# Patient Record
Sex: Female | Born: 1940 | Race: White | Hispanic: No | Marital: Single | Smoking: Never smoker
Health system: Southern US, Community
[De-identification: ages and names within clinical notes are randomized; demographics above are authoritative.]

## PROBLEM LIST (undated history)

## (undated) DIAGNOSIS — I10 Essential (primary) hypertension: Secondary | ICD-10-CM

## (undated) DIAGNOSIS — H3530 Unspecified macular degeneration: Secondary | ICD-10-CM

## (undated) DIAGNOSIS — D638 Anemia in other chronic diseases classified elsewhere: Secondary | ICD-10-CM

## (undated) DIAGNOSIS — I499 Cardiac arrhythmia, unspecified: Secondary | ICD-10-CM

## (undated) DIAGNOSIS — K219 Gastro-esophageal reflux disease without esophagitis: Secondary | ICD-10-CM

## (undated) DIAGNOSIS — T8859XA Other complications of anesthesia, initial encounter: Secondary | ICD-10-CM

## (undated) DIAGNOSIS — J449 Chronic obstructive pulmonary disease, unspecified: Secondary | ICD-10-CM

## (undated) DIAGNOSIS — F419 Anxiety disorder, unspecified: Secondary | ICD-10-CM

## (undated) DIAGNOSIS — M797 Fibromyalgia: Secondary | ICD-10-CM

## (undated) DIAGNOSIS — T4145XA Adverse effect of unspecified anesthetic, initial encounter: Secondary | ICD-10-CM

## (undated) DIAGNOSIS — R351 Nocturia: Secondary | ICD-10-CM

## (undated) DIAGNOSIS — Z9289 Personal history of other medical treatment: Secondary | ICD-10-CM

## (undated) DIAGNOSIS — I639 Cerebral infarction, unspecified: Secondary | ICD-10-CM

## (undated) DIAGNOSIS — M1990 Unspecified osteoarthritis, unspecified site: Secondary | ICD-10-CM

## (undated) DIAGNOSIS — E119 Type 2 diabetes mellitus without complications: Secondary | ICD-10-CM

## (undated) DIAGNOSIS — N179 Acute kidney failure, unspecified: Secondary | ICD-10-CM

## (undated) DIAGNOSIS — J45909 Unspecified asthma, uncomplicated: Secondary | ICD-10-CM

## (undated) DIAGNOSIS — K5790 Diverticulosis of intestine, part unspecified, without perforation or abscess without bleeding: Secondary | ICD-10-CM

## (undated) DIAGNOSIS — G2581 Restless legs syndrome: Secondary | ICD-10-CM

## (undated) DIAGNOSIS — M4030 Flatback syndrome, site unspecified: Secondary | ICD-10-CM

## (undated) DIAGNOSIS — G4730 Sleep apnea, unspecified: Secondary | ICD-10-CM

## (undated) DIAGNOSIS — L299 Pruritus, unspecified: Secondary | ICD-10-CM

## (undated) HISTORY — DX: Restless legs syndrome: G25.81

## (undated) HISTORY — DX: Chronic obstructive pulmonary disease, unspecified: J44.9

## (undated) HISTORY — PX: TONSILLECTOMY: SUR1361

## (undated) HISTORY — PX: DILATION AND CURETTAGE OF UTERUS: SHX78

## (undated) HISTORY — PX: HERNIA REPAIR: SHX51

## (undated) HISTORY — PX: OTHER SURGICAL HISTORY: SHX169

## (undated) HISTORY — DX: Anemia in other chronic diseases classified elsewhere: D63.8

## (undated) HISTORY — PX: BREAST REDUCTION SURGERY: SHX8

## (undated) HISTORY — PX: BACK SURGERY: SHX140

## (undated) HISTORY — PX: POSTERIOR LAMINECTOMY / DECOMPRESSION LUMBAR SPINE: SUR740

## (undated) HISTORY — PX: ABDOMINAL HYSTERECTOMY: SHX81

---

## 1988-09-17 HISTORY — PX: CHOLECYSTECTOMY: SHX55

## 2008-09-11 ENCOUNTER — Ambulatory Visit: Payer: Self-pay | Admitting: Family Medicine

## 2008-09-17 DIAGNOSIS — I639 Cerebral infarction, unspecified: Secondary | ICD-10-CM

## 2008-09-17 HISTORY — DX: Cerebral infarction, unspecified: I63.9

## 2008-09-28 ENCOUNTER — Ambulatory Visit: Payer: Self-pay | Admitting: Family Medicine

## 2008-11-09 ENCOUNTER — Ambulatory Visit: Payer: Self-pay | Admitting: Internal Medicine

## 2009-01-10 ENCOUNTER — Ambulatory Visit: Payer: Self-pay | Admitting: Internal Medicine

## 2009-03-28 ENCOUNTER — Ambulatory Visit: Payer: Self-pay | Admitting: Internal Medicine

## 2009-04-15 ENCOUNTER — Inpatient Hospital Stay: Payer: Self-pay | Admitting: Internal Medicine

## 2009-09-17 DIAGNOSIS — Z9289 Personal history of other medical treatment: Secondary | ICD-10-CM

## 2009-09-17 HISTORY — DX: Personal history of other medical treatment: Z92.89

## 2009-12-02 ENCOUNTER — Ambulatory Visit: Payer: Self-pay | Admitting: Internal Medicine

## 2010-05-04 ENCOUNTER — Emergency Department: Payer: Self-pay | Admitting: Emergency Medicine

## 2010-11-28 ENCOUNTER — Emergency Department: Payer: Self-pay | Admitting: Emergency Medicine

## 2010-11-30 ENCOUNTER — Encounter: Payer: Self-pay | Admitting: Internal Medicine

## 2012-04-13 ENCOUNTER — Emergency Department: Payer: Self-pay | Admitting: *Deleted

## 2012-04-13 LAB — CK TOTAL AND CKMB (NOT AT ARMC): CK-MB: 1.7 ng/mL (ref 0.5–3.6)

## 2012-04-13 LAB — COMPREHENSIVE METABOLIC PANEL
Alkaline Phosphatase: 108 U/L (ref 50–136)
BUN: 20 mg/dL — ABNORMAL HIGH (ref 7–18)
Chloride: 103 mmol/L (ref 98–107)
Co2: 26 mmol/L (ref 21–32)
EGFR (African American): 60 — ABNORMAL LOW
EGFR (Non-African Amer.): 51 — ABNORMAL LOW
Potassium: 3.8 mmol/L (ref 3.5–5.1)
SGOT(AST): 30 U/L (ref 15–37)
SGPT (ALT): 24 U/L
Total Protein: 7.1 g/dL (ref 6.4–8.2)

## 2012-04-13 LAB — CBC
HGB: 13.3 g/dL (ref 12.0–16.0)
MCH: 31.3 pg (ref 26.0–34.0)
MCV: 90 fL (ref 80–100)
RBC: 4.26 10*6/uL (ref 3.80–5.20)
WBC: 7.1 10*3/uL (ref 3.6–11.0)

## 2012-04-13 LAB — TROPONIN I: Troponin-I: <0.02 ng/mL

## 2013-03-26 ENCOUNTER — Encounter (HOSPITAL_COMMUNITY): Payer: Self-pay | Admitting: Pharmacy Technician

## 2013-03-26 NOTE — Progress Notes (Signed)
Need orders please - PT coming for PREOP TUES 03/31/13 - Thank you

## 2013-03-29 ENCOUNTER — Other Ambulatory Visit: Payer: Self-pay | Admitting: Orthopedic Surgery

## 2013-03-29 MED ORDER — DEXAMETHASONE SODIUM PHOSPHATE 10 MG/ML IJ SOLN: 10.0000 mg | Freq: Once | INTRAMUSCULAR | Status: DC

## 2013-03-29 MED ORDER — BUPIVACAINE LIPOSOME 1.3 % IJ SUSP: 20.0000 mL | Freq: Once | INTRAMUSCULAR | Status: DC

## 2013-03-29 NOTE — Progress Notes (Signed)
Preoperative surgical orders have been place into the [facility] hospital system for Cheyenne Frank on 03/29/2013, 10:53 PM  by Patrica Duel for surgery on 04/06/2013.  Preop Total Knee orders including Experal, PO Tylenol, and IV Decadron as long as there are no contraindications to the above medications. Avel Peace, PA-C

## 2013-03-31 ENCOUNTER — Encounter (HOSPITAL_COMMUNITY): Payer: Self-pay

## 2013-03-31 ENCOUNTER — Encounter (HOSPITAL_COMMUNITY)
Admission: RE | Admit: 2013-03-31 | Discharge: 2013-03-31 | Disposition: A | Discharge status: Home / Self Care | Payer: Medicare Other | Source: Ambulatory Visit | Attending: Orthopedic Surgery | Admitting: Orthopedic Surgery

## 2013-03-31 DIAGNOSIS — Z01812 Encounter for preprocedural laboratory examination: Secondary | ICD-10-CM | POA: Insufficient documentation

## 2013-03-31 DIAGNOSIS — M1710 Unilateral primary osteoarthritis, unspecified knee: Secondary | ICD-10-CM | POA: Insufficient documentation

## 2013-03-31 HISTORY — DX: Flatback syndrome, site unspecified: M40.30

## 2013-03-31 HISTORY — DX: Unspecified asthma, uncomplicated: J45.909

## 2013-03-31 HISTORY — DX: Unspecified osteoarthritis, unspecified site: M19.90

## 2013-03-31 HISTORY — DX: Personal history of other medical treatment: Z92.89

## 2013-03-31 HISTORY — DX: Nocturia: R35.1

## 2013-03-31 HISTORY — DX: Diverticulosis of intestine, part unspecified, without perforation or abscess without bleeding: K57.90

## 2013-03-31 HISTORY — DX: Anxiety disorder, unspecified: F41.9

## 2013-03-31 HISTORY — DX: Essential (primary) hypertension: I10

## 2013-03-31 HISTORY — DX: Fibromyalgia: M79.7

## 2013-03-31 HISTORY — DX: Sleep apnea, unspecified: G47.30

## 2013-03-31 HISTORY — DX: Gastro-esophageal reflux disease without esophagitis: K21.9

## 2013-03-31 HISTORY — DX: Other complications of anesthesia, initial encounter: T88.59XA

## 2013-03-31 HISTORY — DX: Unspecified macular degeneration: H35.30

## 2013-03-31 HISTORY — DX: Cerebral infarction, unspecified: I63.9

## 2013-03-31 HISTORY — DX: Pruritus, unspecified: L29.9

## 2013-03-31 HISTORY — DX: Type 2 diabetes mellitus without complications: E11.9

## 2013-03-31 HISTORY — DX: Cardiac arrhythmia, unspecified: I49.9

## 2013-03-31 HISTORY — DX: Adverse effect of unspecified anesthetic, initial encounter: T41.45XA

## 2013-03-31 LAB — URINALYSIS, ROUTINE W REFLEX MICROSCOPIC
Bilirubin Urine: NEGATIVE
Nitrite: NEGATIVE
Specific Gravity, Urine: 1.028 (ref 1.005–1.030)
Urobilinogen, UA: 0.2 mg/dL (ref 0.0–1.0)
pH: 5 (ref 5.0–8.0)

## 2013-03-31 LAB — URINE MICROSCOPIC-ADD ON

## 2013-03-31 LAB — COMPREHENSIVE METABOLIC PANEL
ALT: 22 U/L (ref 0–35)
AST: 25 U/L (ref 0–37)
Albumin: 3.4 g/dL — ABNORMAL LOW (ref 3.5–5.2)
Alkaline Phosphatase: 101 U/L (ref 39–117)
BUN: 21 mg/dL (ref 6–23)
Chloride: 101 mEq/L (ref 96–112)
Potassium: 4.3 mEq/L (ref 3.5–5.1)
Sodium: 141 mEq/L (ref 135–145)
Total Bilirubin: 0.2 mg/dL — ABNORMAL LOW (ref 0.3–1.2)
Total Protein: 6.8 g/dL (ref 6.0–8.3)

## 2013-03-31 LAB — APTT: aPTT: 27 seconds (ref 24–37)

## 2013-03-31 LAB — CBC
HCT: 41 % (ref 36.0–46.0)
MCHC: 32.2 g/dL (ref 30.0–36.0)
Platelets: 221 10*3/uL (ref 150–400)
RDW: 13.8 % (ref 11.5–15.5)
WBC: 8.2 10*3/uL (ref 4.0–10.5)

## 2013-03-31 LAB — PROTIME-INR: Prothrombin Time: 12.2 seconds (ref 11.6–15.2)

## 2013-03-31 LAB — SURGICAL PCR SCREEN: Staphylococcus aureus: NEGATIVE

## 2013-03-31 NOTE — Patient Instructions (Signed)
Cheyenne Frank  03/31/2013                           YOUR PROCEDURE IS SCHEDULED ON: 04/06/13               PLEASE REPORT TO SHORT STAY CENTER AT : 8:30 AM               CALL THIS NUMBER IF ANY PROBLEMS THE DAY OF SURGERY :               [phone]                      REMEMBER:   Do not eat food or drink liquids AFTER MIDNIGHT      Take these medicines the morning of surgery with A SIP OF WATER:  CARVEDILOL / GABAPENTIN / PAXIL / TAKE ONLY 1/2 DOSE OF INSULIN THE NIGHT BEFORE SURGERY   Do not wear jewelry, make-up   Do not wear lotions, powders, or perfumes.   Do not shave legs or underarms 12 hrs. before surgery (men may shave face)  Do not bring valuables to the hospital.  Contacts, dentures or bridgework may not be worn into surgery.  Leave suitcase in the car. After surgery it may be brought to your room.  For patients admitted to the hospital more than one night, checkout time is 11:00                          The day of discharge.   Patients discharged the day of surgery will not be allowed to drive home                             If going home same day of surgery, must have someone stay with you first                           24 hrs at home and arrange for some one to drive you home from hospital.    Special Instructions:   Please read over the following fact sheets that you were given:               1. MRSA  INFORMATION                      2. [name] PREPARING FOR SURGERY SHEET               3. INCENTIVE SPIROMETER                                                X_____________________________________________________________________        Failure to follow these instructions may result in cancellation of your surgery

## 2013-04-01 NOTE — Progress Notes (Signed)
Abnormal UA / CMET faxed to Dr.Aluisio 

## 2013-04-05 NOTE — H&P (Signed)
TOTAL KNEE ADMISSION H&P  Patient is being admitted for left total knee arthroplasty.  Subjective:  Chief Complaint:left knee pain.  HPI: Cheyenne Frank, 72 y.o. female, has a history of pain and functional disability in the left knee due to arthritis and has failed non-surgical conservative treatments for greater than 12 weeks to includeNSAID's and/or analgesics, corticosteriod injections, viscosupplementation injections, use of assistive devices and activity modification.  Onset of symptoms was gradual, starting 20 years ago with gradually worsening course since that time. The patient noted no past surgery on the left knee(s).  Patient currently rates pain in the left knee(s) at 8 out of 10 with activity. Patient has night pain, worsening of pain with activity and weight bearing, pain that interferes with activities of daily living, pain with passive range of motion, crepitus and joint swelling.  Patient has evidence of periarticular osteophytes and joint space narrowing by imaging studies. There is no active infection.   Past Medical History  Diagnosis Date  . Macular degeneration   . Complication of anesthesia     " feel like I'm dying after I wake up"  . Hypertension   . Dysrhythmia     unknown type   . Asthma     no problems since 1999 (after moving into a new home)  . Stroke 2010    verbal aphasia x 30 min - resolved - no problem since then  . Arthritis   . Fibromyalgia   . Itching   . GERD (gastroesophageal reflux disease)   . Nocturia   . Hx of transfusion 2011  . Diabetes mellitus without complication   . Sleep apnea     Dx 1990's unable to wear c-pap -  . Anxiety   . Diverticulosis   . Flat back syndrome     Past Surgical History  Procedure Laterality Date  . Tonsillectomy    . Dilation and curettage of uterus    . Abdominal hysterectomy      rt so  . Hernia repair    . Breast reduction surgery    . Cholecystectomy  1990  . Cataracts removed    . Back surgery   2010 / 2011     Current outpatient prescriptions: acetaminophen (TYLENOL) 500 MG tablet, Take 1,000 mg by mouth every 6 (six) hours as needed for pain., Disp: , Rfl: ;  aspirin EC 81 MG tablet, Take 81 mg by mouth every morning., Disp: , Rfl: ;  beta carotene w/minerals (OCUVITE) tablet, Take 1 tablet by mouth 2 (two) times daily., Disp: , Rfl: ;  carvedilol (COREG) 3.125 MG tablet, Take 3.125 mg by mouth 2 (two) times daily with a meal., Disp: , Rfl:  gabapentin (NEURONTIN) 800 MG tablet, Take 800 mg by mouth 3 (three) times daily., Disp: , Rfl: ;  insulin glargine (LANTUS) 100 UNIT/ML injection, Inject 48 Units into the skin at bedtime., Disp: , Rfl: ;  lisinopril (PRINIVIL,ZESTRIL) 20 MG tablet, Take 20 mg by mouth 2 (two) times daily., Disp: , Rfl: ;  naproxen sodium (ANAPROX) 220 MG tablet, Take 440 mg by mouth 2 (two) times daily as needed (for pain)., Disp: , Rfl:  PARoxetine (PAXIL) 20 MG tablet, Take 20 mg by mouth every morning., Disp: , Rfl: ;  traZODone (DESYREL) 50 MG tablet, Take 50 mg by mouth at bedtime., Disp: , Rfl:   Allergies  Allergen Reactions  . Demerol (Meperidine)     Agitation  . Dilaudid (Hydromorphone)     Paranoia  .  Sulfa Antibiotics Rash    History  Substance Use Topics  . Smoking status: Never Smoker   . Smokeless tobacco: Not on file  . Alcohol Use: No   Family History Father deceased in his 55s due to MI Mother deceased at 68 due to respiratory complications  Review of Systems  Constitutional: Positive for malaise/fatigue. Negative for fever, chills, weight loss and diaphoresis.  HENT: Negative.  Negative for neck pain.   Eyes: Negative.   Respiratory: Positive for shortness of breath. Negative for cough, hemoptysis, sputum production and wheezing.        With exertion  Cardiovascular: Negative.   Gastrointestinal: Positive for heartburn. Negative for nausea, vomiting, abdominal pain, diarrhea, blood in stool and melena.  Genitourinary: Positive for  frequency. Negative for dysuria, urgency, hematuria and flank pain.  Musculoskeletal: Positive for back pain and joint pain. Negative for myalgias and falls.       Left knee   Skin: Positive for itching. Negative for rash.  Neurological: Positive for tremors. Negative for dizziness, tingling, sensory change, speech change, focal weakness, seizures, loss of consciousness and weakness.  Endo/Heme/Allergies: Negative.   Psychiatric/Behavioral: Negative.     Objective:  Physical Exam  Constitutional: She is oriented to person, place, and time. She appears well-developed and well-nourished. No distress.  HENT:  Head: Normocephalic and atraumatic.  Right Ear: External ear normal.  Left Ear: External ear normal.  Nose: Nose normal.  Mouth/Throat: Oropharynx is clear and moist.  Eyes: Conjunctivae and EOM are normal.  Neck: Normal range of motion. Neck supple. No thyromegaly present.  Cardiovascular: Normal rate, normal heart sounds and intact distal pulses.  An irregularly irregular rhythm present.  No murmur heard. Respiratory: Effort normal. No respiratory distress. She has decreased breath sounds. She has no wheezes. She exhibits no tenderness.  GI: Soft. Bowel sounds are normal. She exhibits no distension and no mass. There is no tenderness.  Musculoskeletal:       Right hip: Normal.       Left hip: Normal.       Right knee: Normal.       Left knee: She exhibits decreased range of motion and swelling. She exhibits no effusion and no erythema. Tenderness found. Medial joint line and lateral joint line tenderness noted.       Right lower leg: She exhibits no tenderness and no swelling.       Left lower leg: She exhibits no tenderness and no swelling.  Her left hip shows normal range of motion with no discomfort. Her left knee shows no effusion. There is s slight varus. Range is 5 to 125. There is marked crepitus on range of motion. She is tender medial greater than lateral with no  instability. Her right knee exam is unremarkable.  Lymphadenopathy:    She has no cervical adenopathy.  Neurological: She is alert and oriented to person, place, and time. She has normal strength and normal reflexes. She displays tremor. No sensory deficit.  Skin: No rash noted. She is not diaphoretic. No erythema.  Psychiatric: She has a normal mood and affect. Her behavior is normal.    Vitals Weight: 225 lb Height: 62 in Body Surface Area: 2.11 m Body Mass Index: 41.15 kg/m Pulse: 72 (Regular) BP: 126/72 (Sitting, Left Arm, Standard)  Imaging Review Plain radiographs demonstrate severe degenerative joint disease of the left knee(s). The overall alignment ismild varus. The bone quality appears to be fair for age and reported activity level.  Assessment/Plan:  End stage arthritis, left knee   The patient history, physical examination, clinical judgment of the provider and imaging studies are consistent with end stage degenerative joint disease of the left knee(s) and total knee arthroplasty is deemed medically necessary. The treatment options including medical management, injection therapy arthroscopy and arthroplasty were discussed at length. The risks and benefits of total knee arthroplasty were presented and reviewed. The risks due to aseptic loosening, infection, stiffness, patella tracking problems, thromboembolic complications and other imponderables were discussed. The patient acknowledged the explanation, agreed to proceed with the plan and consent was signed. Patient is being admitted for inpatient treatment for surgery, pain control, PT, OT, prophylactic antibiotics, VTE prophylaxis, progressive ambulation and ADL's and discharge planning. The patient is planning to be discharged to skilled nursing facility [facility] or [address])   [place], [place]

## 2013-04-06 ENCOUNTER — Encounter (HOSPITAL_COMMUNITY): Payer: Self-pay | Admitting: *Deleted

## 2013-04-06 ENCOUNTER — Encounter (HOSPITAL_COMMUNITY)
Admission: RE | Disposition: A | Discharge status: Skilled Nursing Facility | Payer: Self-pay | Source: Ambulatory Visit | Attending: Orthopedic Surgery

## 2013-04-06 ENCOUNTER — Inpatient Hospital Stay (HOSPITAL_COMMUNITY)
Admission: RE | Admit: 2013-04-06 | Discharge: 2013-04-09 | DRG: 470 | Disposition: A | Discharge status: Skilled Nursing Facility | Payer: Medicare Other | Source: Ambulatory Visit | Attending: Orthopedic Surgery | Admitting: Orthopedic Surgery

## 2013-04-06 ENCOUNTER — Inpatient Hospital Stay (HOSPITAL_COMMUNITY): Payer: Medicare Other | Admitting: *Deleted

## 2013-04-06 ENCOUNTER — Other Ambulatory Visit: Payer: Self-pay | Admitting: Surgical

## 2013-04-06 ENCOUNTER — Inpatient Hospital Stay (HOSPITAL_COMMUNITY): Payer: Medicare Other

## 2013-04-06 DIAGNOSIS — M179 Osteoarthritis of knee, unspecified: Secondary | ICD-10-CM

## 2013-04-06 DIAGNOSIS — M1710 Unilateral primary osteoarthritis, unspecified knee: Principal | ICD-10-CM | POA: Diagnosis present

## 2013-04-06 DIAGNOSIS — IMO0001 Reserved for inherently not codable concepts without codable children: Secondary | ICD-10-CM | POA: Diagnosis present

## 2013-04-06 DIAGNOSIS — F411 Generalized anxiety disorder: Secondary | ICD-10-CM | POA: Diagnosis present

## 2013-04-06 DIAGNOSIS — I10 Essential (primary) hypertension: Secondary | ICD-10-CM

## 2013-04-06 DIAGNOSIS — Z8673 Personal history of transient ischemic attack (TIA), and cerebral infarction without residual deficits: Secondary | ICD-10-CM

## 2013-04-06 DIAGNOSIS — D62 Acute posthemorrhagic anemia: Secondary | ICD-10-CM | POA: Diagnosis not present

## 2013-04-06 DIAGNOSIS — K219 Gastro-esophageal reflux disease without esophagitis: Secondary | ICD-10-CM | POA: Diagnosis present

## 2013-04-06 DIAGNOSIS — H3530 Unspecified macular degeneration: Secondary | ICD-10-CM | POA: Diagnosis present

## 2013-04-06 DIAGNOSIS — J45909 Unspecified asthma, uncomplicated: Secondary | ICD-10-CM | POA: Diagnosis present

## 2013-04-06 DIAGNOSIS — Z794 Long term (current) use of insulin: Secondary | ICD-10-CM

## 2013-04-06 DIAGNOSIS — E119 Type 2 diabetes mellitus without complications: Secondary | ICD-10-CM

## 2013-04-06 DIAGNOSIS — Z79899 Other long term (current) drug therapy: Secondary | ICD-10-CM

## 2013-04-06 DIAGNOSIS — E1165 Type 2 diabetes mellitus with hyperglycemia: Secondary | ICD-10-CM | POA: Diagnosis present

## 2013-04-06 DIAGNOSIS — Z96652 Presence of left artificial knee joint: Secondary | ICD-10-CM

## 2013-04-06 DIAGNOSIS — Z6841 Body Mass Index (BMI) 40.0 and over, adult: Secondary | ICD-10-CM

## 2013-04-06 DIAGNOSIS — E1142 Type 2 diabetes mellitus with diabetic polyneuropathy: Secondary | ICD-10-CM | POA: Diagnosis present

## 2013-04-06 DIAGNOSIS — G4730 Sleep apnea, unspecified: Secondary | ICD-10-CM

## 2013-04-06 HISTORY — PX: TOTAL KNEE ARTHROPLASTY: SHX125

## 2013-04-06 LAB — GLUCOSE, CAPILLARY
Glucose-Capillary: 210 mg/dL — ABNORMAL HIGH (ref 70–99)
Glucose-Capillary: 239 mg/dL — ABNORMAL HIGH (ref 70–99)

## 2013-04-06 LAB — TYPE AND SCREEN: Antibody Screen: NEGATIVE

## 2013-04-06 SURGERY — ARTHROPLASTY, KNEE, TOTAL
Anesthesia: General | Site: Knee | Laterality: Left | Wound class: Clean

## 2013-04-06 MED ORDER — SODIUM CHLORIDE 0.9 % IJ SOLN
INTRAMUSCULAR | Status: DC | PRN
  Administered 2013-04-06: 30 mL

## 2013-04-06 MED ORDER — DIPHENHYDRAMINE HCL 12.5 MG/5ML PO ELIX: 12.5000 mg | ORAL_SOLUTION | ORAL | Status: DC | PRN

## 2013-04-06 MED ORDER — GUAIFENESIN ER 600 MG PO TB12
600.0000 mg | ORAL_TABLET | Freq: Two times a day (BID) | ORAL | Status: DC | PRN
  Administered 2013-04-06: 600 mg via ORAL
  Filled 2013-04-06: qty 1

## 2013-04-06 MED ORDER — BUPIVACAINE LIPOSOME 1.3 % IJ SUSP
20.0000 mL | Freq: Once | INTRAMUSCULAR | Status: DC
  Filled 2013-04-06: qty 20

## 2013-04-06 MED ORDER — BUPIVACAINE LIPOSOME 1.3 % IJ SUSP
INTRAMUSCULAR | Status: DC | PRN
  Administered 2013-04-06: 20 mL

## 2013-04-06 MED ORDER — METOCLOPRAMIDE HCL 5 MG/ML IJ SOLN
INTRAMUSCULAR | Status: DC | PRN
  Administered 2013-04-06: 10 mg via INTRAVENOUS

## 2013-04-06 MED ORDER — BISACODYL 10 MG RE SUPP: 10.0000 mg | Freq: Every day | RECTAL | Status: DC | PRN

## 2013-04-06 MED ORDER — LACTATED RINGERS IV SOLN: INTRAVENOUS | Status: DC

## 2013-04-06 MED ORDER — INSULIN GLARGINE 100 UNIT/ML ~~LOC~~ SOLN
48.0000 [IU] | Freq: Every day | SUBCUTANEOUS | Status: DC
  Administered 2013-04-07 – 2013-04-08 (×2): 48 [IU] via SUBCUTANEOUS
  Filled 2013-04-06 (×3): qty 0.48

## 2013-04-06 MED ORDER — ACETAMINOPHEN 500 MG PO TABS
1000.0000 mg | ORAL_TABLET | Freq: Once | ORAL | Status: DC
  Filled 2013-04-06: qty 2

## 2013-04-06 MED ORDER — GABAPENTIN 400 MG PO CAPS
800.0000 mg | ORAL_CAPSULE | Freq: Three times a day (TID) | ORAL | Status: DC
  Administered 2013-04-06 – 2013-04-09 (×8): 800 mg via ORAL
  Filled 2013-04-06 (×10): qty 2

## 2013-04-06 MED ORDER — ACETAMINOPHEN 500 MG PO TABS
1000.0000 mg | ORAL_TABLET | Freq: Four times a day (QID) | ORAL | Status: DC
  Administered 2013-04-07 – 2013-04-08 (×6): 1000 mg via ORAL
  Filled 2013-04-06 (×12): qty 2

## 2013-04-06 MED ORDER — ALBUTEROL SULFATE HFA 108 (90 BASE) MCG/ACT IN AERS
INHALATION_SPRAY | RESPIRATORY_TRACT | Status: DC | PRN
  Administered 2013-04-06: 5 via RESPIRATORY_TRACT

## 2013-04-06 MED ORDER — SODIUM CHLORIDE 0.9 % IR SOLN
Status: DC | PRN
  Administered 2013-04-06: 1000 mL

## 2013-04-06 MED ORDER — ONDANSETRON HCL 4 MG/2ML IJ SOLN: 4.0000 mg | Freq: Four times a day (QID) | INTRAMUSCULAR | Status: DC | PRN

## 2013-04-06 MED ORDER — RIVAROXABAN 10 MG PO TABS
10.0000 mg | ORAL_TABLET | Freq: Every day | ORAL | Status: DC
  Administered 2013-04-07 – 2013-04-09 (×3): 10 mg via ORAL
  Filled 2013-04-06 (×4): qty 1

## 2013-04-06 MED ORDER — CEFAZOLIN SODIUM-DEXTROSE 2-3 GM-% IV SOLR
2.0000 g | INTRAVENOUS | Status: AC
  Administered 2013-04-06: 2 g via INTRAVENOUS

## 2013-04-06 MED ORDER — DEXAMETHASONE SODIUM PHOSPHATE 10 MG/ML IJ SOLN
10.0000 mg | Freq: Every day | INTRAMUSCULAR | Status: AC
  Filled 2013-04-06: qty 1

## 2013-04-06 MED ORDER — DEXAMETHASONE 6 MG PO TABS
10.0000 mg | ORAL_TABLET | Freq: Every day | ORAL | Status: AC
  Administered 2013-04-07: 10 mg via ORAL
  Filled 2013-04-06: qty 1

## 2013-04-06 MED ORDER — GABAPENTIN 800 MG PO TABS: 800.0000 mg | ORAL_TABLET | Freq: Three times a day (TID) | ORAL | Status: DC

## 2013-04-06 MED ORDER — FLEET ENEMA 7-19 GM/118ML RE ENEM: 1.0000 | ENEMA | Freq: Once | RECTAL | Status: AC | PRN

## 2013-04-06 MED ORDER — DOCUSATE SODIUM 100 MG PO CAPS
100.0000 mg | ORAL_CAPSULE | Freq: Two times a day (BID) | ORAL | Status: DC
  Administered 2013-04-06 – 2013-04-09 (×6): 100 mg via ORAL

## 2013-04-06 MED ORDER — METOCLOPRAMIDE HCL 5 MG PO TABS
5.0000 mg | ORAL_TABLET | Freq: Three times a day (TID) | ORAL | Status: DC | PRN
  Filled 2013-04-06: qty 2

## 2013-04-06 MED ORDER — PAROXETINE HCL 20 MG PO TABS
20.0000 mg | ORAL_TABLET | Freq: Every morning | ORAL | Status: DC
  Administered 2013-04-07 – 2013-04-09 (×3): 20 mg via ORAL
  Filled 2013-04-06 (×3): qty 1

## 2013-04-06 MED ORDER — TRAMADOL HCL 50 MG PO TABS
50.0000 mg | ORAL_TABLET | Freq: Four times a day (QID) | ORAL | Status: DC | PRN
  Administered 2013-04-07 – 2013-04-08 (×3): 100 mg via ORAL
  Filled 2013-04-06 (×3): qty 2

## 2013-04-06 MED ORDER — SODIUM CHLORIDE 0.9 % IV SOLN: INTRAVENOUS | Status: DC

## 2013-04-06 MED ORDER — FENTANYL CITRATE 0.05 MG/ML IJ SOLN: 25.0000 ug | INTRAMUSCULAR | Status: DC | PRN

## 2013-04-06 MED ORDER — LABETALOL HCL 5 MG/ML IV SOLN
INTRAVENOUS | Status: DC | PRN
  Administered 2013-04-06 (×2): 5 mg via INTRAVENOUS

## 2013-04-06 MED ORDER — HYDRALAZINE HCL 20 MG/ML IJ SOLN
INTRAMUSCULAR | Status: DC | PRN
  Administered 2013-04-06: 2 mg via INTRAVENOUS
  Administered 2013-04-06: 4 mg via INTRAVENOUS

## 2013-04-06 MED ORDER — METHOCARBAMOL 100 MG/ML IJ SOLN
500.0000 mg | Freq: Four times a day (QID) | INTRAVENOUS | Status: DC | PRN
  Filled 2013-04-06 (×2): qty 5

## 2013-04-06 MED ORDER — FENTANYL CITRATE 0.05 MG/ML IJ SOLN
INTRAMUSCULAR | Status: DC | PRN
  Administered 2013-04-06: 50 ug via INTRAVENOUS
  Administered 2013-04-06: 25 ug via INTRAVENOUS
  Administered 2013-04-06 (×3): 50 ug via INTRAVENOUS
  Administered 2013-04-06: 25 ug via INTRAVENOUS

## 2013-04-06 MED ORDER — BUPIVACAINE HCL (PF) 0.25 % IJ SOLN
INTRAMUSCULAR | Status: DC | PRN
  Administered 2013-04-06: 20 mL

## 2013-04-06 MED ORDER — 0.9 % SODIUM CHLORIDE (POUR BTL) OPTIME
TOPICAL | Status: DC | PRN
  Administered 2013-04-06: 1000 mL

## 2013-04-06 MED ORDER — POLYETHYLENE GLYCOL 3350 17 G PO PACK: 17.0000 g | PACK | Freq: Every day | ORAL | Status: DC | PRN

## 2013-04-06 MED ORDER — MIDAZOLAM HCL 5 MG/5ML IJ SOLN
INTRAMUSCULAR | Status: DC | PRN
  Administered 2013-04-06: 0.5 mg via INTRAVENOUS
  Administered 2013-04-06: 2 mg via INTRAVENOUS

## 2013-04-06 MED ORDER — CARVEDILOL 3.125 MG PO TABS
3.1250 mg | ORAL_TABLET | Freq: Two times a day (BID) | ORAL | Status: DC
  Administered 2013-04-06 – 2013-04-09 (×6): 3.125 mg via ORAL
  Filled 2013-04-06 (×8): qty 1

## 2013-04-06 MED ORDER — ONDANSETRON HCL 4 MG/2ML IJ SOLN
INTRAMUSCULAR | Status: DC | PRN
  Administered 2013-04-06: 4 mg via INTRAVENOUS

## 2013-04-06 MED ORDER — MORPHINE SULFATE 2 MG/ML IJ SOLN
1.0000 mg | INTRAMUSCULAR | Status: DC | PRN
  Administered 2013-04-06: 2 mg via INTRAVENOUS
  Administered 2013-04-06: 1 mg via INTRAVENOUS
  Filled 2013-04-06 (×2): qty 1

## 2013-04-06 MED ORDER — TRAZODONE HCL 50 MG PO TABS
50.0000 mg | ORAL_TABLET | Freq: Every day | ORAL | Status: DC
  Administered 2013-04-06 – 2013-04-08 (×3): 50 mg via ORAL
  Filled 2013-04-06 (×4): qty 1

## 2013-04-06 MED ORDER — SODIUM CHLORIDE 0.9 % IV SOLN
INTRAVENOUS | Status: DC
  Administered 2013-04-06: 75 mL/h via INTRAVENOUS

## 2013-04-06 MED ORDER — METHOCARBAMOL 500 MG PO TABS
500.0000 mg | ORAL_TABLET | Freq: Four times a day (QID) | ORAL | Status: DC | PRN
  Administered 2013-04-06 – 2013-04-09 (×9): 500 mg via ORAL
  Filled 2013-04-06 (×9): qty 1

## 2013-04-06 MED ORDER — OXYCODONE HCL 5 MG PO TABS
5.0000 mg | ORAL_TABLET | ORAL | Status: DC | PRN
  Administered 2013-04-06 – 2013-04-07 (×4): 10 mg via ORAL
  Administered 2013-04-08: 5 mg via ORAL
  Administered 2013-04-08: 10 mg via ORAL
  Administered 2013-04-08: 5 mg via ORAL
  Administered 2013-04-08: 10 mg via ORAL
  Administered 2013-04-08 – 2013-04-09 (×3): 5 mg via ORAL
  Administered 2013-04-09 (×2): 10 mg via ORAL
  Filled 2013-04-06: qty 2
  Filled 2013-04-06: qty 1
  Filled 2013-04-06: qty 2
  Filled 2013-04-06 (×2): qty 1
  Filled 2013-04-06: qty 2
  Filled 2013-04-06: qty 1
  Filled 2013-04-06 (×2): qty 2
  Filled 2013-04-06: qty 1
  Filled 2013-04-06: qty 2
  Filled 2013-04-06: qty 1
  Filled 2013-04-06 (×2): qty 2

## 2013-04-06 MED ORDER — PROPOFOL 10 MG/ML IV BOLUS
INTRAVENOUS | Status: DC | PRN
  Administered 2013-04-06: 180 mg via INTRAVENOUS
  Administered 2013-04-06: 20 mg via INTRAVENOUS

## 2013-04-06 MED ORDER — SUCCINYLCHOLINE CHLORIDE 20 MG/ML IJ SOLN
INTRAMUSCULAR | Status: DC | PRN
  Administered 2013-04-06: 100 mg via INTRAVENOUS

## 2013-04-06 MED ORDER — DEXAMETHASONE SODIUM PHOSPHATE 4 MG/ML IJ SOLN
INTRAMUSCULAR | Status: DC | PRN
  Administered 2013-04-06: 10 mg via INTRAVENOUS

## 2013-04-06 MED ORDER — METOCLOPRAMIDE HCL 5 MG/ML IJ SOLN: 5.0000 mg | Freq: Three times a day (TID) | INTRAMUSCULAR | Status: DC | PRN

## 2013-04-06 MED ORDER — PROMETHAZINE HCL 25 MG/ML IJ SOLN: 6.2500 mg | INTRAMUSCULAR | Status: DC | PRN

## 2013-04-06 MED ORDER — CEFAZOLIN SODIUM 1-5 GM-% IV SOLN
1.0000 g | Freq: Four times a day (QID) | INTRAVENOUS | Status: AC
  Administered 2013-04-06 – 2013-04-07 (×2): 1 g via INTRAVENOUS
  Filled 2013-04-06 (×2): qty 50

## 2013-04-06 MED ORDER — MENTHOL 3 MG MT LOZG: 1.0000 | LOZENGE | OROMUCOSAL | Status: DC | PRN

## 2013-04-06 MED ORDER — PHENOL 1.4 % MT LIQD: 1.0000 | OROMUCOSAL | Status: DC | PRN

## 2013-04-06 MED ORDER — INSULIN ASPART 100 UNIT/ML ~~LOC~~ SOLN
0.0000 [IU] | Freq: Three times a day (TID) | SUBCUTANEOUS | Status: DC
  Administered 2013-04-06 – 2013-04-07 (×3): 5 [IU] via SUBCUTANEOUS
  Administered 2013-04-07: 8 [IU] via SUBCUTANEOUS

## 2013-04-06 MED ORDER — CHLORHEXIDINE GLUCONATE 4 % EX LIQD: 60.0000 mL | Freq: Once | CUTANEOUS | Status: DC

## 2013-04-06 MED ORDER — ONDANSETRON HCL 4 MG PO TABS: 4.0000 mg | ORAL_TABLET | Freq: Four times a day (QID) | ORAL | Status: DC | PRN

## 2013-04-06 MED ORDER — ACETAMINOPHEN 650 MG RE SUPP
650.0000 mg | Freq: Four times a day (QID) | RECTAL | Status: DC
  Filled 2013-04-06 (×8): qty 1

## 2013-04-06 MED ORDER — PHENYLEPHRINE HCL 10 MG/ML IJ SOLN
INTRAMUSCULAR | Status: DC | PRN
  Administered 2013-04-06: 120 ug via INTRAVENOUS
  Administered 2013-04-06: 80 ug via INTRAVENOUS

## 2013-04-06 MED ORDER — LACTATED RINGERS IV SOLN
INTRAVENOUS | Status: DC
  Administered 2013-04-06 (×2): via INTRAVENOUS
  Administered 2013-04-06: 1000 mL via INTRAVENOUS

## 2013-04-06 SURGICAL SUPPLY — 57 items
BAG ZIPLOCK 12X15 (MISCELLANEOUS) ×2 IMPLANT
BANDAGE ELASTIC 6 VELCRO ST LF (GAUZE/BANDAGES/DRESSINGS) ×2 IMPLANT
BANDAGE ESMARK 6X9 LF (GAUZE/BANDAGES/DRESSINGS) ×1 IMPLANT
BLADE SAG 18X100X1.27 (BLADE) ×2 IMPLANT
BLADE SAW SGTL 11.0X1.19X90.0M (BLADE) ×2 IMPLANT
BNDG ESMARK 6X9 LF (GAUZE/BANDAGES/DRESSINGS) ×2
BOWL SMART MIX CTS (DISPOSABLE) ×2 IMPLANT
CAPT RP KNEE ×2 IMPLANT
CEMENT HV SMART SET (Cement) ×4 IMPLANT
CLOTH BEACON ORANGE TIMEOUT ST (SAFETY) ×2 IMPLANT
CUFF TOURN SGL QUICK 34 (TOURNIQUET CUFF) ×1
CUFF TRNQT CYL 34X4X40X1 (TOURNIQUET CUFF) ×1 IMPLANT
DECANTER SPIKE VIAL GLASS SM (MISCELLANEOUS) ×2 IMPLANT
DRAPE EXTREMITY T 121X128X90 (DRAPE) ×2 IMPLANT
DRAPE POUCH INSTRU U-SHP 10X18 (DRAPES) ×2 IMPLANT
DRAPE U-SHAPE 47X51 STRL (DRAPES) ×2 IMPLANT
DRSG ADAPTIC 3X8 NADH LF (GAUZE/BANDAGES/DRESSINGS) ×2 IMPLANT
DRSG PAD ABDOMINAL 8X10 ST (GAUZE/BANDAGES/DRESSINGS) ×2 IMPLANT
DURAPREP 26ML APPLICATOR (WOUND CARE) ×2 IMPLANT
ELECT REM PT RETURN 9FT ADLT (ELECTROSURGICAL) ×2
ELECTRODE REM PT RTRN 9FT ADLT (ELECTROSURGICAL) ×1 IMPLANT
EVACUATOR 1/8 PVC DRAIN (DRAIN) ×2 IMPLANT
FACESHIELD LNG OPTICON STERILE (SAFETY) ×10 IMPLANT
GLOVE BIO SURGEON STRL SZ7.5 (GLOVE) IMPLANT
GLOVE BIO SURGEON STRL SZ8 (GLOVE) ×2 IMPLANT
GLOVE BIOGEL PI IND STRL 8 (GLOVE) ×1 IMPLANT
GLOVE BIOGEL PI INDICATOR 8 (GLOVE) ×1
GLOVE SURG SS PI 6.5 STRL IVOR (GLOVE) ×6 IMPLANT
GOWN BRE IMP PREV XXLGXLNG (GOWN DISPOSABLE) ×2 IMPLANT
GOWN STRL NON-REIN LRG LVL3 (GOWN DISPOSABLE) ×4 IMPLANT
GOWN STRL REIN XL XLG (GOWN DISPOSABLE) ×2 IMPLANT
HANDPIECE INTERPULSE COAX TIP (DISPOSABLE) ×1
IMMOBILIZER KNEE 20 (SOFTGOODS) ×2
IMMOBILIZER KNEE 20 THIGH 36 (SOFTGOODS) ×1 IMPLANT
KIT BASIN OR (CUSTOM PROCEDURE TRAY) ×2 IMPLANT
MANIFOLD NEPTUNE II (INSTRUMENTS) ×2 IMPLANT
NDL SAFETY ECLIPSE 18X1.5 (NEEDLE) ×2 IMPLANT
NEEDLE HYPO 18GX1.5 SHARP (NEEDLE) ×2
NS IRRIG 1000ML POUR BTL (IV SOLUTION) ×2 IMPLANT
PACK TOTAL JOINT (CUSTOM PROCEDURE TRAY) ×2 IMPLANT
PADDING CAST COTTON 6X4 STRL (CAST SUPPLIES) ×4 IMPLANT
POSITIONER SURGICAL ARM (MISCELLANEOUS) ×2 IMPLANT
SET HNDPC FAN SPRY TIP SCT (DISPOSABLE) ×1 IMPLANT
SPONGE GAUZE 4X4 12PLY (GAUZE/BANDAGES/DRESSINGS) ×2 IMPLANT
STRIP CLOSURE SKIN 1/2X4 (GAUZE/BANDAGES/DRESSINGS) ×4 IMPLANT
SUCTION FRAZIER 12FR DISP (SUCTIONS) ×2 IMPLANT
SUT MNCRL AB 4-0 PS2 18 (SUTURE) ×2 IMPLANT
SUT VIC AB 2-0 CT1 27 (SUTURE) ×3
SUT VIC AB 2-0 CT1 TAPERPNT 27 (SUTURE) ×3 IMPLANT
SUT VLOC 180 0 24IN GS25 (SUTURE) ×2 IMPLANT
SYR 20CC LL (SYRINGE) ×2 IMPLANT
SYR 50ML LL SCALE MARK (SYRINGE) ×2 IMPLANT
TOWEL OR 17X26 10 PK STRL BLUE (TOWEL DISPOSABLE) ×4 IMPLANT
TRAY FOLEY BAG SILVER LF 16FR (CATHETERS) ×2 IMPLANT
TRAY FOLEY CATH 14FRSI W/METER (CATHETERS) IMPLANT
WATER STERILE IRR 1500ML POUR (IV SOLUTION) ×4 IMPLANT
WRAP KNEE MAXI GEL POST OP (GAUZE/BANDAGES/DRESSINGS) ×2 IMPLANT

## 2013-04-06 NOTE — Progress Notes (Signed)
Utilization review completed.  

## 2013-04-06 NOTE — Anesthesia Postprocedure Evaluation (Signed)
Anesthesia Post Note  Patient: Cheyenne Frank  Procedure(s) Performed: Procedure(s) (LRB): LEFT TOTAL KNEE ARTHROPLASTY (Left)  Anesthesia type: General  Patient location: PACU  Post pain: Pain level controlled  Post assessment: Post-op Vital signs reviewed  Last Vitals:  Filed Vitals:   04/06/13 1502  BP: 155/77  Pulse: 102  Temp: 37 C  Resp: 14    Post vital signs: Reviewed  Level of consciousness: sedated  Complications: No apparent anesthesia complications

## 2013-04-06 NOTE — Op Note (Signed)
Pre-operative diagnosis- Osteoarthritis  Left knee(s)  Post-operative diagnosis- Osteoarthritis Left knee(s)  Procedure-  Left  Total Knee Arthroplasty  Surgeon- Cheyenne Rankin. Errica Dutil, MD  Assistant- Dimitri Ped, PA-C   Anesthesia-  General EBL-* No blood loss amount entered *  Drains Hemovac  Tourniquet time-  Total Tourniquet Time Documented: Thigh (Left) - 36 minutes Total: Thigh (Left) - 36 minutes    Complications- None  Condition-PACU - hemodynamically stable.   Brief Clinical Note  Cheyenne Frank is a 72 y.o. year old female with end stage OA of her left knee with progressively worsening pain and dysfunction. She has constant pain, with activity and at rest and significant functional deficits with difficulties even with ADLs. She has had extensive non-op management including analgesics, injections of cortisone and viscosupplements, and home exercise program, but remains in significant pain with significant dysfunction. Radiographs show bone on bone arthritis medial and patellofemoral. She presents now for left Total Knee Arthroplasty.     Procedure in detail---   The patient is brought into the operating room and positioned supine on the operating table. After successful administration of  General,   a tourniquet is placed high on the  Left thigh(s) and the lower extremity is prepped and draped in the usual sterile fashion. Time out is performed by the operating team and then the  Left lower extremity is wrapped in Esmarch, knee flexed and the tourniquet inflated to 300 mmHg.       A midline incision is made with a ten blade through the subcutaneous tissue to the level of the extensor mechanism. A fresh blade is used to make a medial parapatellar arthrotomy. Soft tissue over the proximal medial tibia is subperiosteally elevated to the joint line with a knife and into the semimembranosus bursa with a Cobb elevator. Soft tissue over the proximal lateral tibia is elevated with  attention being paid to avoiding the patellar tendon on the tibial tubercle. The patella is everted, knee flexed 90 degrees and the ACL and PCL are removed. Findings are bone on bone medial and patellofemoral with large global osteophytes.        The drill is used to create a starting hole in the distal femur and the canal is thoroughly irrigated with sterile saline to remove the fatty contents. The 5 degree Left  valgus alignment guide is placed into the femoral canal and the distal femoral cutting block is pinned to remove 10 mm off the distal femur. Resection is made with an oscillating saw.      The tibia is subluxed forward and the menisci are removed. The extramedullary alignment guide is placed referencing proximally at the medial aspect of the tibial tubercle and distally along the second metatarsal axis and tibial crest. The block is pinned to remove 2mm off the more deficient medial  side. Resection is made with an oscillating saw. Size 3is the most appropriate size for the tibia and the proximal tibia is prepared with the modular drill and keel punch for that size.      The femoral sizing guide is placed and size 4 narrow is most appropriate. Rotation is marked off the epicondylar axis and confirmed by creating a rectangular flexion gap at 90 degrees. The size 4 cutting block is pinned in this rotation and the anterior, posterior and chamfer cuts are made with the oscillating saw. The intercondylar block is then placed and that cut is made.      Trial size 3 tibial component, trial size  4 narrow posterior stabilized femur and a 10  mm posterior stabilized rotating platform insert trial is placed. Full extension is achieved with excellent varus/valgus and anterior/posterior balance throughout full range of motion. The patella is everted and thickness measured to be 21  mm. Free hand resection is taken to 12 mm, a 35 template is placed, lug holes are drilled, trial patella is placed, and it tracks  normally. Osteophytes are removed off the posterior femur with the trial in place. All trials are removed and the cut bone surfaces prepared with pulsatile lavage. Cement is mixed and once ready for implantation, the size 3 tibial implant, size  4 narrow posterior stabilized femoral component, and the size 35 patella are cemented in place and the patella is held with the clamp. The trial insert is placed and the knee held in full extension. The Exparel (20 ml mixed with 30 ml saline) and .25% Bupivicaine, are injected into the extensor mechanism, posterior capsule, medial and lateral gutters and subcutaneous tissues.  All extruded cement is removed and once the cement is hard the permanent 10 mm posterior stabilized rotating platform insert is placed into the tibial tray.      The wound is copiously irrigated with saline solution and the extensor mechanism closed over a hemovac drain with #1 PDS suture. The tourniquet is released for a total tourniquet time of 36  minutes. Flexion against gravity is 140 degrees and the patella tracks normally. Subcutaneous tissue is closed with 2.0 vicryl and subcuticular with running 4.0 Monocryl. The incision is cleaned and dried and steri-strips and a bulky sterile dressing are applied. The limb is placed into a knee immobilizer and the patient is awakened and transported to recovery in stable condition.      Please note that a surgical assistant was a medical necessity for this procedure in order to perform it in a safe and expeditious manner. Surgical assistant was necessary to retract the ligaments and vital neurovascular structures to prevent injury to them and also necessary for proper positioning of the limb to allow for anatomic placement of the prosthesis.   Cheyenne Rankin Crystall Donaldson, MD    04/06/2013, 12:53 PM

## 2013-04-06 NOTE — Preoperative (Signed)
Beta Blockers   Reason not to administer Beta Blockers:Not Applicable, took BB this am 

## 2013-04-06 NOTE — Progress Notes (Signed)
Spoke with pt in regards to readiness for nighttime CPAP, pt states does not want to wear CPAP tonight. Pt verbalizes understanding that RT is available throughout night for assistance if this decision changes. Vitals stable on 2L 02, RT will assist as needed.

## 2013-04-06 NOTE — Progress Notes (Signed)
Patient desats while sleeping. 02 sat 87-88% on 2LNC while asleep. HR 115-120. Patient easily arousable and following commands. 02 sat > 95% when awake. Dr. Shon Baton notified, CPAP orders given.

## 2013-04-06 NOTE — Interval H&P Note (Signed)
History and Physical Interval Note:  04/06/2013 9:36 AM  Cheyenne Frank  has presented today for surgery, with the diagnosis of Osteoarthritis of the Left Knee  The various methods of treatment have been discussed with the patient and family. After consideration of risks, benefits and other options for treatment, the patient has consented to  Procedure(s): LEFT TOTAL KNEE ARTHROPLASTY (Left) as a surgical intervention .  The patient's history has been reviewed, patient examined, no change in status, stable for surgery.  I have reviewed the patient's chart and labs.  Questions were answered to the patient's satisfaction.     Loanne Drilling

## 2013-04-06 NOTE — Transfer of Care (Signed)
Immediate Anesthesia Transfer of Care Note  Patient: Cheyenne Frank  Procedure(s) Performed: Procedure(s): LEFT TOTAL KNEE ARTHROPLASTY (Left)  Patient Location: PACU  Anesthesia Type:General  Level of Consciousness: sedated and responds to stimulation, ventilating well, even unlabored  Airway & Oxygen Therapy: Patient Spontanous Breathing and Patient connected to face mask oxygen, nasal airway  Post-op Assessment: Report given to PACU RN and Post -op Vital signs reviewed and stable  Post vital signs: Reviewed and stable  Complications: No apparent anesthesia complications

## 2013-04-06 NOTE — Progress Notes (Signed)
Spoke with patient about nocturnal CPAP per RN request. Patient admits to total noncompliance at home and is extremely reluctant to attempt now. She states she feels like she is "suffocating" and has never been able to tolerate a mask. Education provided. Although she is not wearing the CPAP tonight, she does agree to daytime trials in an attempt to become used to wearing the apparatus on her face. Patient has agreed after daytime trial, will attempt again for sleep tomorrow. RT to follow.

## 2013-04-06 NOTE — Progress Notes (Signed)
RT delivered CPAP unit to PT room at approximately 1840. PT was sitting in bed with [place], eating. RT asked RN to notify RT when PT is finished eating, then RT will place PT on CPAP at an appropriate time after eating. PT does not appear to be in respiratory distress at this time.

## 2013-04-06 NOTE — Anesthesia Preprocedure Evaluation (Signed)
Anesthesia Evaluation  Patient identified by MRN, date of birth, ID band Patient awake    Reviewed: Allergy & Precautions, H&P , NPO status , Patient's Chart, lab work & pertinent test results  Airway Mallampati: II TM Distance: >3 FB Neck ROM: Full    Dental  (+) Teeth Intact and Dental Advisory Given   Pulmonary asthma , sleep apnea (Noncompliant with CPAP) ,  breath sounds clear to auscultation  Pulmonary exam normal       Cardiovascular hypertension, Pt. on medications and Pt. on home beta blockers + dysrhythmias Rhythm:Regular Rate:Normal     Neuro/Psych Anxiety Chronic back pain with history scoliosis  Neuromuscular disease CVA, No Residual Symptoms negative psych ROS   GI/Hepatic Neg liver ROS, GERD-  Medicated,  Endo/Other  diabetes, Type 2, Insulin DependentMorbid obesity  Renal/GU negative Renal ROS  negative genitourinary   Musculoskeletal  (+) Fibromyalgia -  Abdominal   Peds negative pediatric ROS (+)  Hematology negative hematology ROS (+)   Anesthesia Other Findings   Reproductive/Obstetrics negative OB ROS                           Anesthesia Physical Anesthesia Plan  ASA: III  Anesthesia Plan: General   Post-op Pain Management:    Induction: Intravenous  Airway Management Planned: Oral ETT  Additional Equipment:   Intra-op Plan:   Post-operative Plan: Extubation in OR  Informed Consent: I have reviewed the patients History and Physical, chart, labs and discussed the procedure including the risks, benefits and alternatives for the proposed anesthesia with the patient or authorized representative who has indicated his/her understanding and acceptance.   Dental advisory given  Plan Discussed with: CRNA  Anesthesia Plan Comments:         Anesthesia Quick Evaluation

## 2013-04-07 ENCOUNTER — Encounter (HOSPITAL_COMMUNITY): Payer: Self-pay | Admitting: Orthopedic Surgery

## 2013-04-07 DIAGNOSIS — G4730 Sleep apnea, unspecified: Secondary | ICD-10-CM | POA: Diagnosis present

## 2013-04-07 DIAGNOSIS — E119 Type 2 diabetes mellitus without complications: Secondary | ICD-10-CM | POA: Diagnosis present

## 2013-04-07 DIAGNOSIS — E1142 Type 2 diabetes mellitus with diabetic polyneuropathy: Secondary | ICD-10-CM | POA: Diagnosis present

## 2013-04-07 DIAGNOSIS — E1165 Type 2 diabetes mellitus with hyperglycemia: Secondary | ICD-10-CM | POA: Diagnosis present

## 2013-04-07 DIAGNOSIS — I10 Essential (primary) hypertension: Secondary | ICD-10-CM | POA: Diagnosis present

## 2013-04-07 LAB — BASIC METABOLIC PANEL
Calcium: 8.7 mg/dL (ref 8.4–10.5)
GFR calc Af Amer: 72 mL/min — ABNORMAL LOW (ref >90)
GFR calc non Af Amer: 62 mL/min — ABNORMAL LOW (ref >90)
Potassium: 5.1 mEq/L (ref 3.5–5.1)
Sodium: 136 mEq/L (ref 135–145)

## 2013-04-07 LAB — CBC
MCH: 29.3 pg (ref 26.0–34.0)
MCHC: 31.7 g/dL (ref 30.0–36.0)
Platelets: 231 10*3/uL (ref 150–400)

## 2013-04-07 LAB — GLUCOSE, CAPILLARY
Glucose-Capillary: 233 mg/dL — ABNORMAL HIGH (ref 70–99)
Glucose-Capillary: 286 mg/dL — ABNORMAL HIGH (ref 70–99)
Glucose-Capillary: 319 mg/dL — ABNORMAL HIGH (ref 70–99)

## 2013-04-07 MED ORDER — INSULIN ASPART 100 UNIT/ML ~~LOC~~ SOLN
0.0000 [IU] | SUBCUTANEOUS | Status: DC
  Administered 2013-04-07: 11 [IU] via SUBCUTANEOUS
  Administered 2013-04-08: 3 [IU] via SUBCUTANEOUS
  Administered 2013-04-08: 2 [IU] via SUBCUTANEOUS
  Administered 2013-04-08: 5 [IU] via SUBCUTANEOUS
  Administered 2013-04-08: 8 [IU] via SUBCUTANEOUS
  Administered 2013-04-08: 5 [IU] via SUBCUTANEOUS
  Administered 2013-04-09: 2 [IU] via SUBCUTANEOUS
  Administered 2013-04-09: 3 [IU] via SUBCUTANEOUS

## 2013-04-07 NOTE — Evaluation (Signed)
Physical Therapy Evaluation Patient Details Name: Cheyenne [name] MRN: [id number] DOB: [birth year] Today's Date: 04/07/2013 Time: 0850-0910 PT Time Calculation (min): 20 min  PT Assessment / Plan / Recommendation History of Present Illness  72 yo female s/p L TKA. Hx of macular degeneration, HTN, CVA, fibromyalgia  Clinical Impression  On eval, pt required Min assist for mobility-able to take a few steps in room with RW. Session limited due to pt's drowsiness and difficulty remaining alert-likely due to meds. Will progress activity as able. Recommend SNF for continued rehab.     PT Assessment  Patient needs continued PT services    Follow Up Recommendations  SNF    Does the patient have the potential to tolerate intense rehabilitation      Barriers to Discharge        Equipment Recommendations  None recommended by PT    Recommendations for Other Services     Frequency 7X/week    Precautions / Restrictions Precautions Precautions: Fall Required Braces or Orthoses: Knee Immobilizer - Left Knee Immobilizer - Left: Discontinue once straight leg raise with < 10 degree lag Restrictions Weight Bearing Restrictions: No LLE Weight Bearing: Weight bearing as tolerated   Pertinent Vitals/Pain L knee 5/10 with activity      Mobility  Bed Mobility Bed Mobility: Supine to Sit Supine to Sit: 4: Min assist;HOB elevated;With rails Details for Bed Mobility Assistance: Assist for L LE off bed. Increased time. VCS safety, technique, hand placement Transfers Transfers: Sit to Stand;Stand to Sit Sit to Stand: 4: Min assist;From bed Stand to Sit: 4: Min assist;To chair/3-in-1;With armrests Details for Transfer Assistance: Assist to rise, stabilize, control descent. VCs safety, technique, hand placement Ambulation/Gait Ambulation/Gait Assistance: 4: Min assist Ambulation Distance (Feet): 3 Feet Assistive device: Rolling walker Ambulation/Gait Assistance Details: VCs safety, sequence,  distance from RW and moderate cues for pt to keep eyes open. Pt having difficulty so deferred further ambulation for safety reasons.  Gait Pattern: Step-to pattern;Decreased stride length;Decreased step length - right    Exercises     PT Diagnosis: Difficulty walking;Abnormality of gait;Acute pain  PT Problem List: Decreased strength;Decreased range of motion;Decreased activity tolerance;Decreased balance;Decreased mobility;Pain;Decreased knowledge of use of DME;Decreased knowledge of precautions PT Treatment Interventions: DME instruction;Gait training;Functional mobility training;Therapeutic activities;Therapeutic exercise;Balance training;Patient/family education     PT Goals(Current goals can be found in the care plan section) Acute Rehab PT Goals Patient Stated Goal: regain independence prior to returning home with daughter who has MS PT Goal Formulation: With patient Time For Goal Achievement: 04/14/13 Potential to Achieve Goals: Good  Visit Information  Last PT Received On: 04/07/13 Assistance Needed: +1 History of Present Illness: 72 yo female s/p L TKA. Hx of macular degeneration, HTN, CVA, fibromyalgia       Prior Functioning       Cognition  Cognition Arousal/Alertness: Suspect due to medications;Lethargic Behavior During Therapy: WFL for tasks assessed/performed Overall Cognitive Status: Within Functional Limits for tasks assessed    Extremity/Trunk Assessment Upper Extremity Assessment Upper Extremity Assessment: Defer to OT evaluation Lower Extremity Assessment Lower Extremity Assessment: LLE deficits/detail LLE Deficits / Details: hip flex 2/5, moves ankle well Cervical / Trunk Assessment Cervical / Trunk Assessment: Normal   Balance    End of Session PT - End of Session Equipment Utilized During Treatment: Gait belt Activity Tolerance: Patient limited by lethargy Patient left: in chair;with call bell/phone within reach CPM Left Knee CPM Left Knee: Off   GP     Rebeca Alert, MPT  Pager: [phone]

## 2013-04-07 NOTE — Progress Notes (Signed)
Upon arrival RN assisted/placed pt on nighttime CPAP, pt tolerating well. RT will assist as needed.

## 2013-04-07 NOTE — Progress Notes (Signed)
Clinical Social Work Department BRIEF PSYCHOSOCIAL ASSESSMENT 04/07/2013  Patient:  Cheyenne [name], Cheyenne [name]     Account Number:  [id number]     Admit date:  04/06/2013  Clinical Social Worker:  Jacelyn Grip  Date/Time:  04/07/2013 10:21 AM  Referred by:  Physician  Date Referred:  04/07/2013 Referred for  SNF Placement   Other Referral:   Interview type:  Patient Other interview type:    PSYCHOSOCIAL DATA Living Status:  ALONE Admitted from facility:   Level of care:   Primary support name:   Primary support relationship to patient:  CHILD, ADULT Degree of support available:   Per chart, pt has support from children, but no children names listed    CURRENT CONCERNS Current Concerns  Post-Acute Placement   Other Concerns:    SOCIAL WORK ASSESSMENT / PLAN CSW reviewed chart and noted that per H&P, pt is planning to d/c to SNF.    CSW met with pt at bedside to discuss. Pt confirmed that she is interested in SNF placement in [facility]. Pt discussed that she would like [facility]	[place] or [place] for placement. Pt agreeable to initiation of SNF search to those two facilities.    CSW completed FL2 and initiated SNF search to [facility]	[place] and [place].    CSW to follow up with pt re: bed offers.    CSW to continue to follow and facilitate pt discharge needs when pt medically ready for discharge.   Assessment/plan status:  Psychosocial Support/Ongoing Assessment of Needs Other assessment/ plan:   discharge planning   Information/referral to community resources:   Referral to [facility] and [facility]    PATIENT'S/FAMILY'S RESPONSE TO PLAN OF CARE: Pt alert and oriented x 4. Pt recognizes need for rehab at discharge. Pt hopeful for [facility]	[place] or [facility] for placement.     Jacklynn Lewis, MSW, LCSWA (coverage for Cori Razor, LCSW) Clinical Social Work

## 2013-04-07 NOTE — Progress Notes (Signed)
RT returned to PT room at approximately 1600. PT is willing to have RT to place mask on head / face, however; currently PT daughter is visiting in room. RT and PT agreed that RT will return to room this afternoon to work together on compliance. RN aware.

## 2013-04-07 NOTE — Progress Notes (Signed)
   Subjective: 1 Day Post-Op Procedure(s) (LRB): LEFT TOTAL KNEE ARTHROPLASTY (Left) Patient reports pain as moderate.   Patient seen in rounds with Dr. Lequita Halt. Patient is well, and has had no acute complaints or problems We will start therapy today.  Plan is to go Skilled nursing facility after hospital stay.  Objective: Vital signs in last 24 hours: Temp:  [97.5 F (36.4 C)-99.2 F (37.3 C)] 97.5 F (36.4 C) (07/22 0648) Pulse Rate:  [82-119] 112 (07/22 0750) Resp:  [14-18] 16 (07/22 0648) BP: (110-178)/(63-90) 110/70 mmHg (07/22 0750) SpO2:  [92 %-99 %] 96 % (07/22 0648) Weight:  [104.327 kg (230 lb)] 104.327 kg (230 lb) (07/21 1502)  Intake/Output from previous day:  Intake/Output Summary (Last 24 hours) at 04/07/13 0813 Last data filed at 04/07/13 0654  Gross per 24 hour  Intake 3727.5 ml  Output   3370 ml  Net  357.5 ml    Intake/Output this shift: UOP 1250 since MN +357  Labs:  Recent Labs  04/07/13 0420  HGB 12.1    Recent Labs  04/07/13 0420  WBC 15.1*  RBC 4.13  HCT 38.2  PLT 231    Recent Labs  04/07/13 0420  NA 136  K 5.1  CL 98  CO2 30  BUN 21  CREATININE 0.91  GLUCOSE 228*  CALCIUM 8.7   No results found for this basename: LABPT, INR,  in the last 72 hours  EXAM General - Patient is Alert, Appropriate and Oriented Extremity - Neurovascular intact Sensation intact distally Dorsiflexion/Plantar flexion intact Dressing - dressing C/D/I Motor Function - intact, moving foot and toes well on exam.  Hemovac pulled without difficulty.  Past Medical History  Diagnosis Date  . Macular degeneration   . Complication of anesthesia     " feel like I'm dying after I wake up"  . Hypertension   . Dysrhythmia     unknown type   . Asthma     no problems since 1999 (after moving into a new home)  . Stroke 2010    verbal aphasia x 30 min - resolved - no problem since then  . Arthritis   . Fibromyalgia   . Itching   . GERD  (gastroesophageal reflux disease)   . Nocturia   . Hx of transfusion 2011  . Diabetes mellitus without complication   . Sleep apnea     Dx 1990's unable to wear c-pap -  . Anxiety   . Diverticulosis   . Flat back syndrome     Assessment/Plan: 1 Day Post-Op Procedure(s) (LRB): LEFT TOTAL KNEE ARTHROPLASTY (Left) Principal Problem:   OA (osteoarthritis) of knee Active Problems:   Diabetes - resumed meds   Hypertension - resumed meds   Unspecified sleep apnea - CPAP ordered, patient has agreed after daytime trial  Estimated body mass index is 42.06 kg/(m^2) as calculated from the following:   Height as of this encounter: 5\' 2"  (1.575 m).   Weight as of this encounter: 104.327 kg (230 lb). Advance diet Up with therapy Discharge to SNF  DVT Prophylaxis - Xarelto Weight-Bearing as tolerated to left leg No vaccines. D/C O2 and Pulse OX and try on Room [address]  Patrica Duel 04/07/2013, 8:13 AM

## 2013-04-07 NOTE — Progress Notes (Signed)
OT Cancellation Note  Patient Details Name: Cheyenne [name] MRN: [id number] DOB: February 18, 1941   Cancelled Treatment:    Reason Eval/Treat Not Completed: Other (comment) (pt declined x 2). Pt declined due to visitors and just returning to bed on 1st attempt, pt getting ready to eat lunch on 2nd attempt. Will re attempt later today as time allows or in the a.m.  Margaretmary Eddy [facility] 04/07/2013, 1:30 PM

## 2013-04-07 NOTE — Progress Notes (Signed)
RT returned to PT room and PT agreed to wearing CPAP nasal mask- tolerated well (no 02 bleed in- sp02 remained >= 94%). RN entered room with oral meds and PT and RN agreed to replace mask to face / head- encouraged both to contact RT if needed.

## 2013-04-07 NOTE — Progress Notes (Signed)
RT educated and encouraged PT to attempt CPAP trial during day. PT has agreed and RT set up Auto CPAP (Max 20 cm H20 / Min 7 cm H20) with nasal mask. Pt remains on 02 via [place] and currently cycling nasal mask to nose by hand then withdrawing nasal mask by hand when needed. RT plans to see PT again this afternoon to advance having PT agree to placing mask on head / face. PT is tolerating well at this time and does not appear to be in respiratory distress- Sp02 100, rr 16. RN is aware of this plan and agrees.

## 2013-04-07 NOTE — Progress Notes (Signed)
Physical Therapy Treatment Patient Details Name: Cheyenne [name] MRN: [id number] DOB: 01-Jan-1941 Today's Date: 04/07/2013 Time: 3329-5188 PT Time Calculation (min): 31 min  PT Assessment / Plan / Recommendation  History of Present Illness 72 yo female s/p L TKA. Hx of macular degeneration, HTN, CVA, fibromyalgia   Clinical Impression    PT Comments   Progressing. Plan is for SNF  Follow Up Recommendations  SNF     Does the patient have the potential to tolerate intense rehabilitation     Barriers to Discharge        Equipment Recommendations  None recommended by PT    Recommendations for Other Services    Frequency 7X/week   Progress towards PT Goals Progress towards PT goals: Progressing toward goals  Plan Current plan remains appropriate    Precautions / Restrictions Precautions Precautions: Fall Required Braces or Orthoses: Knee Immobilizer - Left Knee Immobilizer - Left: Discontinue once straight leg raise with < 10 degree lag Restrictions Weight Bearing Restrictions: No LLE Weight Bearing: Weight bearing as tolerated   Pertinent Vitals/Pain 5/10 L knee    Mobility  Bed Mobility Bed Mobility: Sit to Supine Sit to Supine: 4: Min assist;HOB elevated;With rail Details for Bed Mobility Assistance: Assist for L LE onto bed. Increased time. VCS safety, technique, hand placement Transfers Transfers: Sit to Stand;Stand to Sit;Stand Pivot Transfers Sit to Stand: 4: Min assist;From bed Stand to Sit: 4: Min assist;To bed Stand Pivot Transfers: 4: Min assist Details for Transfer Assistance: Assist to rise, stabilize, control descent. VCs safety, technique, hand placement Ambulation/Gait Ambulation/Gait Assistance: 4: Min assist Ambulation Distance (Feet): 40 Feet Assistive device: Rolling walker Ambulation/Gait Assistance Details: VCs safety, sequence, distance from walker. Assist to stabilize throughout ambulation. Fatigues fairly easily Gait Pattern: Step-to  pattern;Antalgic;Trunk flexed;Decreased stride length    Exercises Total Joint Exercises Ankle Circles/Pumps: AROM;Both;10 reps;Supine Quad Sets: AROM;Both;10 reps;Supine Heel Slides: AAROM;Left;10 reps;Supine Hip ABduction/ADduction: AAROM;Left;10 reps;Supine Straight Leg Raises: AAROM;Left;10 reps;Supine   PT Diagnosis:    PT Problem List:   PT Treatment Interventions:     PT Goals (current goals can now be found in the care plan section)    Visit Information  Last PT Received On: 04/07/13 Assistance Needed: +1 History of Present Illness: 72 yo female s/p L TKA. Hx of macular degeneration, HTN, CVA, fibromyalgia    Subjective Data      Cognition  Cognition Arousal/Alertness: Awake/alert Behavior During Therapy: WFL for tasks assessed/performed Overall Cognitive Status: Within Functional Limits for tasks assessed    Balance     End of Session PT - End of Session Equipment Utilized During Treatment: Gait belt Activity Tolerance: Patient tolerated treatment well Patient left: in bed;with call bell/phone within reach;with family/visitor present CPM Left Knee CPM Left Knee: On   GP     Rebeca Alert, MPT Pager: [phone]

## 2013-04-07 NOTE — Progress Notes (Addendum)
Clinical Social Work Department CLINICAL SOCIAL WORK PLACEMENT NOTE 04/07/2013  Patient:  Cheyenne, [name]  Account Number:  [id number] Admit date:  04/06/2013  Clinical Social Worker:  Jacelyn Grip  Date/time:  04/07/2013 10:30 AM  Clinical Social Work is seeking post-discharge placement for this patient at the following level of care:   SKILLED NURSING   (*CSW will update this form in Epic as items are completed)    pt requested snf search to [facility]	[place] and [employer] provided with [facility] Department of Clinical Social Work's list of facilities offering this level of care within the geographic area requested by the patient (or if unable, by the patient's family).  04/07/2013  Patient/family informed of their freedom to choose among providers that offer the needed level of care, that participate in Medicare, Medicaid or managed care program needed by the patient, have an available bed and are willing to accept the patient.    Patient/family informed of [facility]' ownership interest in [facility], as well as of the fact that they are under no obligation to receive care at this facility.  PASARR submitted to EDS on 04/07/2013 PASARR number received from [employer] on 04/07/2013  FL2 transmitted to all facilities in geographic area requested by pt/family on  04/07/2013 FL2 transmitted to all facilities within larger geographic area on   Patient informed that his/her managed care company has contracts with or will negotiate with  certain facilities, including the following:     Patient/family informed of bed offers received:  04/08/2013 Patient chooses bed at [facility] Physician recommends and patient chooses bed at    Patient to be transferred to  on  [facility] on 04/09/2013 Patient to be transferred to facility by ambulance Sharin Mons)  The following physician request were entered in Epic:   Additional Comments: Initiated SNF search to [facility] and [facility]	[place] at pt request.   Jacklynn Lewis, MSW, LCSWA (converage for Cori Razor, LCSW) Clinical Social Work

## 2013-04-08 LAB — CBC
Hemoglobin: 10.5 g/dL — ABNORMAL LOW (ref 12.0–15.0)
MCH: 30.3 pg (ref 26.0–34.0)
MCHC: 32.9 g/dL (ref 30.0–36.0)
Platelets: 191 10*3/uL (ref 150–400)
RBC: 3.47 MIL/uL — ABNORMAL LOW (ref 3.87–5.11)

## 2013-04-08 LAB — GLUCOSE, CAPILLARY
Glucose-Capillary: 177 mg/dL — ABNORMAL HIGH (ref 70–99)
Glucose-Capillary: 114 mg/dL — ABNORMAL HIGH (ref 70–99)
Glucose-Capillary: 218 mg/dL — ABNORMAL HIGH (ref 70–99)
Glucose-Capillary: 123 mg/dL — ABNORMAL HIGH (ref 70–99)
Glucose-Capillary: 268 mg/dL — ABNORMAL HIGH (ref 70–99)

## 2013-04-08 LAB — BASIC METABOLIC PANEL
CO2: 35 mEq/L — ABNORMAL HIGH (ref 19–32)
Calcium: 9 mg/dL (ref 8.4–10.5)
GFR calc non Af Amer: 65 mL/min — ABNORMAL LOW (ref >90)
Glucose, Bld: 124 mg/dL — ABNORMAL HIGH (ref 70–99)
Potassium: 4.8 mEq/L (ref 3.5–5.1)
Sodium: 137 mEq/L (ref 135–145)

## 2013-04-08 MED ORDER — ACETAMINOPHEN 500 MG PO TABS
1000.0000 mg | ORAL_TABLET | Freq: Four times a day (QID) | ORAL | Status: DC
  Administered 2013-04-08 – 2013-04-09 (×4): 1000 mg via ORAL
  Filled 2013-04-08 (×6): qty 2

## 2013-04-08 MED ORDER — ACETAMINOPHEN 650 MG RE SUPP
650.0000 mg | Freq: Four times a day (QID) | RECTAL | Status: DC
  Filled 2013-04-08 (×4): qty 1

## 2013-04-08 NOTE — Care Management Note (Signed)
    Page 1 of 1   04/08/2013     11:16:54 AM   CARE MANAGEMENT NOTE 04/08/2013  Patient:  Cheyenne Frank, Cheyenne Frank   Account Number:  [id number]  Date Initiated:  04/08/2013  Documentation initiated by:  Lanier Clam  Subjective/Objective Assessment:   ADMITTED W/OA L KNEE.     Action/Plan:   FROM HOME.   Anticipated DC Date:  04/09/2013   Anticipated DC Plan:  SKILLED NURSING FACILITY      DC Planning Services  CM consult      Choice offered to / List presented to:             Status of service:  In process, will continue to follow Medicare Important Message given?   (If response is "NO", the following Medicare IM given date fields will be blank) Date Medicare IM given:   Date Additional Medicare IM given:    Discharge Disposition:    Per UR Regulation:  Reviewed for med. necessity/level of care/duration of stay  If discussed at Long Length of Stay Meetings, dates discussed:    Comments:  04/08/13 Kalonji Zurawski RN,BSN NCM [phone] POD#2 L TKA.PT-SNF.D/C PLAN SNF,LIKELY THURSDAY PER MD.

## 2013-04-08 NOTE — Progress Notes (Signed)
Physical Therapy Treatment Patient Details Name: Cheyenne Frank MRN: [id number] DOB: June 21, 1941 Today's Date: 04/08/2013 Time: 4098-1191 PT Time Calculation (min): 33 min  PT Assessment / Plan / Recommendation  History of Present Illness 72 yo female s/p L TKA. Hx of macular degeneration, HTN, CVA, fibromyalgia   Clinical Impression    PT Comments   Progressing. Plan is for [facility] tomorrow.   Follow Up Recommendations  SNF     Does the patient have the potential to tolerate intense rehabilitation     Barriers to Discharge        Equipment Recommendations  None recommended by PT    Recommendations for Other Services    Frequency 7X/week   Progress towards PT Goals Progress towards PT goals: Progressing toward goals  Plan Current plan remains appropriate    Precautions / Restrictions Precautions Precautions: Fall Required Braces or Orthoses: Knee Immobilizer - Left Knee Immobilizer - Left: Discontinue once straight leg raise with < 10 degree lag Restrictions Weight Bearing Restrictions: No LLE Weight Bearing: Weight bearing as tolerated   Pertinent Vitals/Pain 7/10 L knee with activity    Mobility  Bed Mobility Bed Mobility: Sit to Supine Supine to Sit: Min assist Sit to Supine: 4: Min assist Details for Bed Mobility Assistance: Assist for L LE off/onto bed. Increased time.  Transfers Transfers: Sit to Stand;Stand to Sit Sit to Stand: 4: Min guard;From chair/3-in-1;From toilet Stand to Sit: To toilet;To bed;4: Min guard Details for Transfer Assistance:  VCs safety, technique, hand placement Ambulation/Gait Ambulation/Gait Assistance: 4: Min guard Ambulation Distance (Feet): 110 Feet Assistive device: Rolling walker Gait Pattern: Step-to pattern;Decreased stride length;Trunk flexed    Exercises Total Joint Exercises Ankle Circles/Pumps: AROM;Both;10 reps;Supine Quad Sets: AROM;Both;10 reps;Supine Short Arc Quad: AAROM;Left;10 reps;Supine Heel Slides: AAROM;Left;10  reps;Supine Hip ABduction/ADduction: AAROM;Left;10 reps;Supine Straight Leg Raises: AAROM;Left;10 reps;Supine   PT Diagnosis:    PT Problem List:   PT Treatment Interventions:     PT Goals (current goals can now be found in the care plan section)    Visit Information  Last PT Received On: 04/08/13 Assistance Needed: +1 History of Present Illness: 72 yo female s/p L TKA. Hx of macular degeneration, HTN, CVA, fibromyalgia    Subjective Data      Cognition  Cognition Arousal/Alertness: Awake/alert Behavior During Therapy: WFL for tasks assessed/performed Overall Cognitive Status: Within Functional Limits for tasks assessed    Balance     End of Session PT - End of Session Activity Tolerance: Patient tolerated treatment well Patient left: in chair;with call bell/phone within reach   GP     Rebeca Alert, MPT Pager: [phone]

## 2013-04-08 NOTE — Progress Notes (Signed)
   Subjective: 2 Days Post-Op Procedure(s) (LRB): LEFT TOTAL KNEE ARTHROPLASTY (Left) Patient reports pain as mild.   Patient seen in rounds with Dr. Lequita Halt. Patient is well, but has had some minor complaints of pain in the knee, requiring pain medications Plan is to go Skilled nursing facility after hospital stay.  Objective: Vital signs in last 24 hours: Temp:  [98.3 F (36.8 C)-98.7 F (37.1 C)] 98.7 F (37.1 C) (07/23 1357) Pulse Rate:  [84-110] 84 (07/23 1426) Resp:  [16] 16 (07/23 1357) BP: (121-154)/(59-82) 137/69 mmHg (07/23 1426) SpO2:  [90 %-100 %] 100 % (07/23 1426)  Intake/Output from previous day:  Intake/Output Summary (Last 24 hours) at 04/08/13 1430 Last data filed at 04/08/13 1340  Gross per 24 hour  Intake    480 ml  Output   2975 ml  Net  -2495 ml    Intake/Output this shift: Total I/O In: 480 [P.O.:480] Out: 1100 [Urine:1100]  Labs:  Recent Labs  04/07/13 0420 04/08/13 0440  HGB 12.1 10.5*    Recent Labs  04/07/13 0420 04/08/13 0440  WBC 15.1* 12.3*  RBC 4.13 3.47*  HCT 38.2 31.9*  PLT 231 191    Recent Labs  04/07/13 0420 04/08/13 0440  NA 136 137  K 5.1 4.8  CL 98 99  CO2 30 35*  BUN 21 28*  CREATININE 0.91 0.87  GLUCOSE 228* 124*  CALCIUM 8.7 9.0   No results found for this basename: LABPT, INR,  in the last 72 hours  EXAM General - Patient is Alert, Appropriate and Oriented Extremity - Neurovascular intact Sensation intact distally Dorsiflexion/Plantar flexion intact Dressing/Incision - clean, dry, no drainage, healing Motor Function - intact, moving foot and toes well on exam.   Past Medical History  Diagnosis Date  . Macular degeneration   . Complication of anesthesia     " feel like I'm dying after I wake up"  . Hypertension   . Dysrhythmia     unknown type   . Asthma     no problems since 1999 (after moving into a new home)  . Stroke 2010    verbal aphasia x 30 min - resolved - no problem since then    . Arthritis   . Fibromyalgia   . Itching   . GERD (gastroesophageal reflux disease)   . Nocturia   . Hx of transfusion 2011  . Diabetes mellitus without complication   . Sleep apnea     Dx 1990's unable to wear c-pap -  . Anxiety   . Diverticulosis   . Flat back syndrome     Assessment/Plan: 2 Days Post-Op Procedure(s) (LRB): LEFT TOTAL KNEE ARTHROPLASTY (Left) Principal Problem:   OA (osteoarthritis) of knee Active Problems:   Diabetes   Hypertension   Unspecified sleep apnea  Estimated body mass index is 42.06 kg/(m^2) as calculated from the following:   Height as of this encounter: 5\' 2"  (1.575 m).   Weight as of this encounter: 104.327 kg (230 lb). Up with therapy Discharge to SNF  DVT Prophylaxis - Xarelto Weight-Bearing as tolerated to left leg  PERKINS, ALEXZANDREW 04/08/2013, 2:30 PM

## 2013-04-08 NOTE — Progress Notes (Addendum)
CSW met with pt at bedside to provide SNF bed offers.  Pt has bed offers from [facility]	[place] and [place].   Pt chooses private room at [facility].   CSW notified facility.  CSW to facilitate pt discharge needs tomorrow.     Jacklynn Lewis, MSW, LCSWA (coverage for Cori Razor, [place] [phone]) Clinical Social Work

## 2013-04-08 NOTE — Progress Notes (Signed)
OT Cancellation Note  Patient Details Name: Cheyenne Frank MRN: [id number] DOB: [birth year]   Cancelled Treatment:    Reason Eval/Treat Not Completed: OT screened, no needs identified, will sign off. Pt states she is doing pretty well with toileting and other ADL here with assist PRN. She prefers to wait to see how she progresses at SNF and if there are any OT needs, address at SNF. Will sign off per pt request.   Lennox Laity [phone] 04/08/2013, 12:46 PM

## 2013-04-08 NOTE — Progress Notes (Signed)
Physical Therapy Treatment Patient Details Name: Cheyenne Frank MRN: [id number] DOB: April 29, 1941 Today's Date: 04/08/2013 Time: 1050-1109 PT Time Calculation (min): 19 min  PT Assessment / Plan / Recommendation  History of Present Illness 72 yo female s/p L TKA. Hx of macular degeneration, HTN, CVA, fibromyalgia   Clinical Impression    PT Comments   Progressing well with mobility.   Follow Up Recommendations  SNF     Does the patient have the potential to tolerate intense rehabilitation     Barriers to Discharge        Equipment Recommendations  None recommended by PT    Recommendations for Other Services    Frequency 7X/week   Progress towards PT Goals Progress towards PT goals: Progressing toward goals  Plan Current plan remains appropriate    Precautions / Restrictions Precautions Precautions: Fall Required Braces or Orthoses: Knee Immobilizer - Left Knee Immobilizer - Left: Discontinue once straight leg raise with < 10 degree lag Restrictions Weight Bearing Restrictions: No LLE Weight Bearing: Weight bearing as tolerated   Pertinent Vitals/Pain 5/10 L knee with activity    Mobility  Bed Mobility Bed Mobility: Supine to Sit Supine to Sit: 4: Min assist;HOB elevated;With rails Details for Bed Mobility Assistance: Assist for L LE off bed. Increased time. VCS safety, technique, hand placement Transfers Transfers: Sit to Stand;Stand to Sit Sit to Stand: From bed;4: Min guard Stand to Sit: 4: Min guard;To chair/3-in-1;With armrests Details for Transfer Assistance:  VCs safety, technique, hand placement Ambulation/Gait Ambulation/Gait Assistance: 4: Min guard Ambulation Distance (Feet): 100 Feet Assistive device: Rolling walker Gait Pattern: Step-to pattern;Decreased stride length;Trunk flexed    Exercises     PT Diagnosis:    PT Problem List:   PT Treatment Interventions:     PT Goals (current goals can now be found in the care plan section)    Visit  Information  Last PT Received On: 04/08/13 Assistance Needed: +1 History of Present Illness: 72 yo female s/p L TKA. Hx of macular degeneration, HTN, CVA, fibromyalgia    Subjective Data      Cognition  Cognition Arousal/Alertness: Awake/alert Behavior During Therapy: WFL for tasks assessed/performed Overall Cognitive Status: Within Functional Limits for tasks assessed    Balance     End of Session PT - End of Session Activity Tolerance: Patient tolerated treatment well Patient left: in chair;with call bell/phone within reach   GP     Rebeca Alert, MPT Pager: [phone]

## 2013-04-09 ENCOUNTER — Encounter: Payer: Self-pay | Admitting: Internal Medicine

## 2013-04-09 DIAGNOSIS — D62 Acute posthemorrhagic anemia: Secondary | ICD-10-CM

## 2013-04-09 HISTORY — DX: Acute posthemorrhagic anemia: D62

## 2013-04-09 LAB — GLUCOSE, CAPILLARY
Glucose-Capillary: 135 mg/dL — ABNORMAL HIGH (ref 70–99)
Glucose-Capillary: 162 mg/dL — ABNORMAL HIGH (ref 70–99)
Glucose-Capillary: 83 mg/dL (ref 70–99)
Glucose-Capillary: 86 mg/dL (ref 70–99)

## 2013-04-09 LAB — CBC
HCT: 33.2 % — ABNORMAL LOW (ref 36.0–46.0)
Hemoglobin: 10.4 g/dL — ABNORMAL LOW (ref 12.0–15.0)
RBC: 3.58 MIL/uL — ABNORMAL LOW (ref 3.87–5.11)

## 2013-04-09 MED ORDER — TRAMADOL HCL 50 MG PO TABS: 50.0000 mg | ORAL_TABLET | Freq: Four times a day (QID) | ORAL | Status: DC | PRN

## 2013-04-09 MED ORDER — METOCLOPRAMIDE HCL 5 MG PO TABS: 5.0000 mg | ORAL_TABLET | Freq: Three times a day (TID) | ORAL | Status: DC | PRN

## 2013-04-09 MED ORDER — DSS 100 MG PO CAPS: 100.0000 mg | ORAL_CAPSULE | Freq: Two times a day (BID) | ORAL | Status: DC

## 2013-04-09 MED ORDER — GUAIFENESIN ER 600 MG PO TB12: 600.0000 mg | ORAL_TABLET | Freq: Two times a day (BID) | ORAL | Status: DC | PRN

## 2013-04-09 MED ORDER — BISACODYL 10 MG RE SUPP: 10.0000 mg | Freq: Every day | RECTAL | Status: DC | PRN

## 2013-04-09 MED ORDER — POLYETHYLENE GLYCOL 3350 17 G PO PACK: 17.0000 g | PACK | Freq: Every day | ORAL | Status: DC | PRN

## 2013-04-09 MED ORDER — OXYCODONE HCL 5 MG PO TABS: 5.0000 mg | ORAL_TABLET | ORAL | Status: DC | PRN

## 2013-04-09 MED ORDER — METHOCARBAMOL 500 MG PO TABS: 500.0000 mg | ORAL_TABLET | Freq: Four times a day (QID) | ORAL | Status: DC | PRN

## 2013-04-09 MED ORDER — DIPHENHYDRAMINE HCL 12.5 MG/5ML PO ELIX: 12.5000 mg | ORAL_SOLUTION | ORAL | Status: DC | PRN

## 2013-04-09 MED ORDER — ONDANSETRON HCL 4 MG PO TABS: 4.0000 mg | ORAL_TABLET | Freq: Four times a day (QID) | ORAL | Status: DC | PRN

## 2013-04-09 MED ORDER — RIVAROXABAN 10 MG PO TABS: 10.0000 mg | ORAL_TABLET | Freq: Every day | ORAL | Status: DC

## 2013-04-09 NOTE — Progress Notes (Signed)
RT placed pt on CPAP via nasal mask set at 7 CMH2O per home settings, Pt tolerating well at this time, RT to monitor and assess as needed.

## 2013-04-09 NOTE — Progress Notes (Signed)
Tried calling report to [facility] nurse stated too busy to take report would call back soon.

## 2013-04-09 NOTE — Progress Notes (Signed)
Physical Therapy Treatment Patient Details Name: Cheyenne Frank MRN: [id number] DOB: 10-Apr-1941 Today's Date: 04/09/2013 Time: 1020-1049 PT Time Calculation (min): 29 min  PT Assessment / Plan / Recommendation  History of Present Illness 72 yo female s/p L TKA. Hx of macular degeneration, HTN, CVA, fibromyalgia   Clinical Impression    PT Comments   Progressing. Plan is for SNF today.   Follow Up Recommendations  SNF     Does the patient have the potential to tolerate intense rehabilitation     Barriers to Discharge        Equipment Recommendations  None recommended by PT    Recommendations for Other Services    Frequency 7X/week   Progress towards PT Goals Progress towards PT goals: Progressing toward goals  Plan Current plan remains appropriate    Precautions / Restrictions Precautions Precautions: Fall Required Braces or Orthoses: Knee Immobilizer - Left Knee Immobilizer - Left: Discontinue once straight leg raise with < 10 degree lag Restrictions Weight Bearing Restrictions: No LLE Weight Bearing: Weight bearing as tolerated   Pertinent Vitals/Pain     Mobility  Bed Mobility Bed Mobility: Not assessed Transfers Transfers: Sit to Stand;Stand to Sit Sit to Stand: 4: Min guard;From bed;From toilet Stand to Sit: 4: Min guard;To chair/3-in-1;To toilet Details for Transfer Assistance:  VCs safety, technique, hand placement Ambulation/Gait Ambulation/Gait Assistance: 4: Min guard Ambulation Distance (Feet): 120 Feet Assistive device: Rolling walker Gait Pattern: Step-to pattern;Decreased stride length;Trunk flexed    Exercises Total Joint Exercises Ankle Circles/Pumps: AROM;Both;10 reps;Seated Quad Sets: AROM;Both;10 reps;Seated Heel Slides: AAROM;Left;10 reps;Seated Hip ABduction/ADduction: AAROM;Left;10 reps;Seated Straight Leg Raises: AAROM;Left;10 reps;Seated   PT Diagnosis:    PT Problem List:   PT Treatment Interventions:     PT Goals (current goals  can now be found in the care plan section)    Visit Information  Last PT Received On: 04/09/13 Assistance Needed: +1 History of Present Illness: 72 yo female s/p L TKA. Hx of macular degeneration, HTN, CVA, fibromyalgia    Subjective Data      Cognition  Cognition Arousal/Alertness: Awake/alert Behavior During Therapy: WFL for tasks assessed/performed Overall Cognitive Status: Within Functional Limits for tasks assessed    Balance     End of Session PT - End of Session Activity Tolerance: Patient tolerated treatment well Patient left: in chair;with call bell/phone within reach   GP     Rebeca Alert, MPT Pager: [phone]

## 2013-04-09 NOTE — Progress Notes (Signed)
RN received Verbal Order from D. Perkins, PA to continue CPAP at rehab center. Settings used are 7 CM H2O. Janin Kozlowski Nikolai, [place] 04/09/2013 11:49 AM

## 2013-04-09 NOTE — Progress Notes (Signed)
Report given to Cordelia Pen, Charity fundraiser at [facility].

## 2013-04-09 NOTE — Progress Notes (Signed)
Pt transported via [facility] to [facility].

## 2013-04-09 NOTE — Discharge Summary (Signed)
Physician Discharge Summary   Patient ID: Cheyenne Frank MRN: [id number] DOB/AGE: 1941/03/06 72 y.o.  Admit date: 04/06/2013 Discharge date: 04-09-2013  Primary Diagnosis:  Osteoarthritis Left knee  Admission Diagnoses:  Past Medical History  Diagnosis Date  . Macular degeneration   . Complication of anesthesia     " feel like I'm dying after I wake up"  . Hypertension   . Dysrhythmia     unknown type   . Asthma     no problems since 1999 (after moving into a new home)  . Stroke 2010    verbal aphasia x 30 min - resolved - no problem since then  . Arthritis   . Fibromyalgia   . Itching   . GERD (gastroesophageal reflux disease)   . Nocturia   . Hx of transfusion 2011  . Diabetes mellitus without complication   . Sleep apnea     Dx 1990's unable to wear c-pap -  . Anxiety   . Diverticulosis   . Flat back syndrome    Discharge Diagnoses:   Principal Problem:   OA (osteoarthritis) of knee Active Problems:   Diabetes   Hypertension   Unspecified sleep apnea   Postoperative anemia due to acute blood loss  Estimated body mass index is 42.06 kg/(m^2) as calculated from the following:   Height as of this encounter: 5\' 2"  (1.575 m).   Weight as of this encounter: 104.327 kg (230 lb).  Procedure:  Procedure(s) (LRB): LEFT TOTAL KNEE ARTHROPLASTY (Left)   Consults: None  HPI: Cheyenne Frank is a 72 y.o. year old female with end stage OA of her left knee with progressively worsening pain and dysfunction. She has constant pain, with activity and at rest and significant functional deficits with difficulties even with ADLs. She has had extensive non-op management including analgesics, injections of cortisone and viscosupplements, and home exercise program, but remains in significant pain with significant dysfunction. Radiographs show bone on bone arthritis medial and patellofemoral. She presents now for left Total Knee Arthroplasty.   Laboratory Data: Admission on 04/06/2013    Component Date Value Range Status  . Glucose-Capillary 04/06/2013 155* 70 - 99 mg/dL Final  . Glucose-Capillary 04/06/2013 147* 70 - 99 mg/dL Final  . Comment 1 40/98/1191 Notify RN   Final  . Comment 2 04/06/2013 Documented in Chart   Final  . WBC 04/07/2013 15.1* 4.0 - 10.5 K/uL Final  . RBC 04/07/2013 4.13  3.87 - 5.11 MIL/uL Final  . Hemoglobin 04/07/2013 12.1  12.0 - 15.0 g/dL Final  . HCT 47/82/9562 38.2  36.0 - 46.0 % Final  . MCV 04/07/2013 92.5  78.0 - 100.0 fL Final  . MCH 04/07/2013 29.3  26.0 - 34.0 pg Final  . MCHC 04/07/2013 31.7  30.0 - 36.0 g/dL Final  . RDW 13/04/6577 13.8  11.5 - 15.5 % Final  . Platelets 04/07/2013 231  150 - 400 K/uL Final  . Sodium 04/07/2013 136  135 - 145 mEq/L Final  . Potassium 04/07/2013 5.1  3.5 - 5.1 mEq/L Final  . Chloride 04/07/2013 98  96 - 112 mEq/L Final  . CO2 04/07/2013 30  19 - 32 mEq/L Final  . Glucose, Bld 04/07/2013 228* 70 - 99 mg/dL Final  . BUN 46/96/2952 21  6 - 23 mg/dL Final  . Creatinine, Ser 04/07/2013 0.91  0.50 - 1.10 mg/dL Final  . Calcium 84/13/2440 8.7  8.4 - 10.5 mg/dL Final  . GFR calc non Af Amer 04/07/2013 62* >90  mL/min Final  . GFR calc Af Amer 04/07/2013 72* >90 mL/min Final   Comment:                                 The eGFR has been calculated                          using the CKD EPI equation.                          This calculation has not been                          validated in all clinical                          situations.                          eGFR's persistently                          <90 mL/min signify                          possible Chronic Kidney Disease.  . Glucose-Capillary 04/06/2013 210* 70 - 99 mg/dL Final  . Glucose-Capillary 04/06/2013 239* 70 - 99 mg/dL Final  . Glucose-Capillary 04/07/2013 206* 70 - 99 mg/dL Final  . Glucose-Capillary 04/07/2013 233* 70 - 99 mg/dL Final  . Glucose-Capillary 04/07/2013 286* 70 - 99 mg/dL Final  . Comment 1 40/98/1191 Notify RN   Final   . WBC 04/08/2013 12.3* 4.0 - 10.5 K/uL Final  . RBC 04/08/2013 3.47* 3.87 - 5.11 MIL/uL Final  . Hemoglobin 04/08/2013 10.5* 12.0 - 15.0 g/dL Final  . HCT 47/82/9562 31.9* 36.0 - 46.0 % Final  . MCV 04/08/2013 91.9  78.0 - 100.0 fL Final  . MCH 04/08/2013 30.3  26.0 - 34.0 pg Final  . MCHC 04/08/2013 32.9  30.0 - 36.0 g/dL Final  . RDW 13/04/6577 13.8  11.5 - 15.5 % Final  . Platelets 04/08/2013 191  150 - 400 K/uL Final  . Sodium 04/08/2013 137  135 - 145 mEq/L Final  . Potassium 04/08/2013 4.8  3.5 - 5.1 mEq/L Final  . Chloride 04/08/2013 99  96 - 112 mEq/L Final  . CO2 04/08/2013 35* 19 - 32 mEq/L Final  . Glucose, Bld 04/08/2013 124* 70 - 99 mg/dL Final  . BUN 46/96/2952 28* 6 - 23 mg/dL Final  . Creatinine, Ser 04/08/2013 0.87  0.50 - 1.10 mg/dL Final  . Calcium 84/13/2440 9.0  8.4 - 10.5 mg/dL Final  . GFR calc non Af Amer 04/08/2013 65* >90 mL/min Final  . GFR calc Af Amer 04/08/2013 76* >90 mL/min Final   Comment:                                 The eGFR has been calculated                          using the CKD EPI equation.  This calculation has not been                          validated in all clinical                          situations.                          eGFR's persistently                          <90 mL/min signify                          possible Chronic Kidney Disease.  . Glucose-Capillary 04/07/2013 319* 70 - 99 mg/dL Final  . Glucose-Capillary 04/08/2013 177* 70 - 99 mg/dL Final  . Glucose-Capillary 04/08/2013 114* 70 - 99 mg/dL Final  . Glucose-Capillary 04/08/2013 233* 70 - 99 mg/dL Final  . Glucose-Capillary 04/08/2013 218* 70 - 99 mg/dL Final  . Glucose-Capillary 04/08/2013 123* 70 - 99 mg/dL Final  . WBC 16/06/9603 9.4  4.0 - 10.5 K/uL Final  . RBC 04/09/2013 3.58* 3.87 - 5.11 MIL/uL Final  . Hemoglobin 04/09/2013 10.4* 12.0 - 15.0 g/dL Final  . HCT 54/05/8118 33.2* 36.0 - 46.0 % Final  . MCV 04/09/2013 92.7  78.0 -  100.0 fL Final  . MCH 04/09/2013 29.1  26.0 - 34.0 pg Final  . MCHC 04/09/2013 31.3  30.0 - 36.0 g/dL Final  . RDW 14/78/2956 14.3  11.5 - 15.5 % Final  . Platelets 04/09/2013 201  150 - 400 K/uL Final  . Glucose-Capillary 04/08/2013 268* 70 - 99 mg/dL Final  . Glucose-Capillary 04/09/2013 135* 70 - 99 mg/dL Final  . Glucose-Capillary 04/09/2013 86  70 - 99 mg/dL Final  . Glucose-Capillary 04/09/2013 83  70 - 99 mg/dL Final  . Comment 1 21/30/8657 Notify RN   Final  . Comment 2 04/09/2013 Documented in Chart   Final  Hospital Outpatient Visit on 03/31/2013  Component Date Value Range Status  . aPTT 03/31/2013 27  24 - 37 seconds Final  . WBC 03/31/2013 8.2  4.0 - 10.5 K/uL Final  . RBC 03/31/2013 4.49  3.87 - 5.11 MIL/uL Final  . Hemoglobin 03/31/2013 13.2  12.0 - 15.0 g/dL Final  . HCT 84/69/6295 41.0  36.0 - 46.0 % Final  . MCV 03/31/2013 91.3  78.0 - 100.0 fL Final  . MCH 03/31/2013 29.4  26.0 - 34.0 pg Final  . MCHC 03/31/2013 32.2  30.0 - 36.0 g/dL Final  . RDW 28/41/3244 13.8  11.5 - 15.5 % Final  . Platelets 03/31/2013 221  150 - 400 K/uL Final  . Sodium 03/31/2013 141  135 - 145 mEq/L Final  . Potassium 03/31/2013 4.3  3.5 - 5.1 mEq/L Final  . Chloride 03/31/2013 101  96 - 112 mEq/L Final  . CO2 03/31/2013 32  19 - 32 mEq/L Final  . Glucose, Bld 03/31/2013 235* 70 - 99 mg/dL Final  . BUN 09/19/7251 21  6 - 23 mg/dL Final  . Creatinine, Ser 03/31/2013 1.19* 0.50 - 1.10 mg/dL Final  . Calcium 66/44/0347 9.8  8.4 - 10.5 mg/dL Final  . Total Protein 03/31/2013 6.8  6.0 - 8.3 g/dL Final  . Albumin 42/59/5638 3.4* 3.5 - 5.2 g/dL Final  . AST 75/64/3329  25  0 - 37 U/L Final  . ALT 03/31/2013 22  0 - 35 U/L Final  . Alkaline Phosphatase 03/31/2013 101  39 - 117 U/L Final  . Total Bilirubin 03/31/2013 0.2* 0.3 - 1.2 mg/dL Final  . GFR calc non Af Amer 03/31/2013 45* >90 mL/min Final  . GFR calc Af Amer 03/31/2013 52* >90 mL/min Final   Comment:                                  The eGFR has been calculated                          using the CKD EPI equation.                          This calculation has not been                          validated in all clinical                          situations.                          eGFR's persistently                          <90 mL/min signify                          possible Chronic Kidney Disease.  Marland Kitchen Prothrombin Time 03/31/2013 12.2  11.6 - 15.2 seconds Final  . INR 03/31/2013 0.92  0.00 - 1.49 Final  . ABO/RH(D) 03/31/2013 O POS   Final  . Antibody Screen 03/31/2013 NEG   Final  . Sample Expiration 03/31/2013 04/09/2013   Final  . Color, Urine 03/31/2013 YELLOW  YELLOW Final  . APPearance 03/31/2013 CLEAR  CLEAR Final  . Specific Gravity, Urine 03/31/2013 1.028  1.005 - 1.030 Final  . pH 03/31/2013 5.0  5.0 - 8.0 Final  . Glucose, UA 03/31/2013 500* NEGATIVE mg/dL Final  . Hgb urine dipstick 03/31/2013 NEGATIVE  NEGATIVE Final  . Bilirubin Urine 03/31/2013 NEGATIVE  NEGATIVE Final  . Ketones, ur 03/31/2013 NEGATIVE  NEGATIVE mg/dL Final  . Protein, ur 16/06/9603 NEGATIVE  NEGATIVE mg/dL Final  . Urobilinogen, UA 03/31/2013 0.2  0.0 - 1.0 mg/dL Final  . Nitrite 54/05/8118 NEGATIVE  NEGATIVE Final  . Leukocytes, UA 03/31/2013 TRACE* NEGATIVE Final  . MRSA, PCR 03/31/2013 NEGATIVE  NEGATIVE Final  . Staphylococcus aureus 03/31/2013 NEGATIVE  NEGATIVE Final   Comment:                                 The Xpert SA Assay (FDA                          approved for NASAL specimens                          in patients over 76 years of age),  is one component of                          a comprehensive surveillance                          program.  Test performance has                          been validated by [facility] for patients greater                          than or equal to 47 year old.                          It is not intended                          to  diagnose infection nor to                          guide or monitor treatment.  . Squamous Epithelial / LPF 03/31/2013 FEW* RARE Final  . WBC, UA 03/31/2013 0-2  <3 WBC/hpf Final  . Bacteria, UA 03/31/2013 FEW* RARE Final  . Crystals 03/31/2013 URIC ACID CRYSTALS* NEGATIVE Final  . Urine-Other 03/31/2013 MUCOUS PRESENT   Final  . ABO/RH(D) 03/31/2013 O POS   Final     X-Rays:Dg Chest 2 View  04/06/2013   *RADIOLOGY REPORT*  Clinical Data: Hypertension  CHEST - 2 VIEW  Comparison: None.  Findings: Cardiomediastinal silhouette appears normal.  No acute pulmonary disease is noted.  Status post surgical posterior fusion of lower thoracic and lumbar spine.  IMPRESSION: No acute cardiopulmonary abnormality seen.   Original Report Authenticated By: Lupita Raider.,  M.D.    EKG: Orders placed during the hospital encounter of 04/06/13  . EKG 12-LEAD  . EKG 12-LEAD     Hospital Course: Cheyenne Frank is a 72 y.o. who was admitted to [facility]. They were brought to the operating room on 04/06/2013 and underwent Procedure(s): LEFT TOTAL KNEE ARTHROPLASTY.  Patient tolerated the procedure well and was later transferred to the recovery room and then to the orthopaedic floor for postoperative care.  They were given PO and IV analgesics for pain control following their surgery.  They were given 24 hours of postoperative antibiotics of  Anti-infectives   Start     Dose/Rate Route Frequency Ordered Stop   04/06/13 2000  ceFAZolin (ANCEF) IVPB 1 g/50 mL premix     1 g 100 mL/hr over 30 Minutes Intravenous Every 6 hours 04/06/13 1507 04/07/13 0238   04/06/13 0900  ceFAZolin (ANCEF) IVPB 2 g/50 mL premix    Comments:  Dose decreased to 2g per P&T policy for weight < 120kg.   2 g 100 mL/hr over 30 Minutes Intravenous On call to O.R. 04/06/13 4098 04/06/13 1139     and started on DVT prophylaxis in the form of Xarelto.   PT and OT were ordered for total joint protocol.  Discharge planning  consulted to help with postop disposition and equipment needs.  Social worker consulted  to assist with locating SNF for rehab post hospital stay.  Respiratory therapy came by following surgery. Patient has history of sleep apnea.   Spoke with patient about nocturnal CPAP per RN request. Patient admits to total noncompliance at home and is extremely reluctant to attempt now. She states she feels like she is "suffocating" and has never been able to tolerate a mask. Education provided. Although she is not wearing the CPAP tonight, she does agree to daytime trials in an attempt to become used to wearing the apparatus on her face. Patient has agreed after daytime trial, will attempt again for sleep tomorrow. RT to follow. Carney Harder, RRT Patient had a decent night on the evening of surgery.  They started to get up OOB with therapy on day one and walked about 40 feet. Hemovac drain was pulled without difficulty.  Patient worked with RT on the face mask for her CPAP and she was able to use that night. Continued to work with therapy into day two walking about 100 feet.  Dressing was changed on day two and the incision was healing well. Continued with therapy and with the CPAP at night. By day three, the patient had progressed with therapy and meeting their goals.  Incision was healing well.  Patient was seen in rounds and was ready to go to the SNF.  Addressed the issue of her CPAP.  She stated that her PCP had ordered the sleep study a while back.  Ordered to continue the CPAP at the SNF but will need to work with her PCP about arranging to receive a home unit.  She will need to follow up with her PC for any further orders considering the use of home CPAP.   Discharge Medications: Prior to Admission medications   Medication Sig Start Date End Date Taking? Authorizing Provider  acetaminophen (TYLENOL) 500 MG tablet Take 1,000 mg by mouth every 6 (six) hours as needed for pain.   Yes Historical Provider,  MD  carvedilol (COREG) 3.125 MG tablet Take 3.125 mg by mouth 2 (two) times daily with a meal.   Yes Historical Provider, MD  gabapentin (NEURONTIN) 800 MG tablet Take 800 mg by mouth 3 (three) times daily.   Yes Historical Provider, MD  insulin glargine (LANTUS) 100 UNIT/ML injection Inject 48 Units into the skin at bedtime.   Yes Historical Provider, MD  lisinopril (PRINIVIL,ZESTRIL) 20 MG tablet Take 20 mg by mouth 2 (two) times daily.   Yes Historical Provider, MD  PARoxetine (PAXIL) 20 MG tablet Take 20 mg by mouth every morning.   Yes Historical Provider, MD  traZODone (DESYREL) 50 MG tablet Take 50 mg by mouth at bedtime.   Yes Historical Provider, MD  bisacodyl (DULCOLAX) 10 MG suppository Place 1 suppository (10 mg total) rectally daily as needed. 04/09/13   Shonique Pelphrey Julien Girt, PA-C  diphenhydrAMINE (BENADRYL) 12.5 MG/5ML elixir Take 5-10 mLs (12.5-25 mg total) by mouth every 4 (four) hours as needed for itching. 04/09/13   Tenelle Andreason Julien Girt, PA-C  docusate sodium 100 MG CAPS Take 100 mg by mouth 2 (two) times daily. 04/09/13   Briston Lax Julien Girt, PA-C  guaiFENesin (MUCINEX) 600 MG 12 hr tablet Take 1 tablet (600 mg total) by mouth 2 (two) times daily as needed. 04/09/13   Dejha King Julien Girt, PA-C  methocarbamol (ROBAXIN) 500 MG tablet Take 1 tablet (500 mg total) by mouth every 6 (six) hours as needed. 04/09/13   Buryl Bamber Julien Girt, PA-C  metoCLOPramide (REGLAN) 5 MG tablet Take 1-2  tablets (5-10 mg total) by mouth every 8 (eight) hours as needed (if ondansetron (ZOFRAN) ineffective.). 04/09/13   Domanik Rainville, PA-C  ondansetron (ZOFRAN) 4 MG tablet Take 1 tablet (4 mg total) by mouth every 6 (six) hours as needed for nausea. 04/09/13   Dymir Neeson, PA-C  oxyCODONE (OXY IR/ROXICODONE) 5 MG immediate release tablet Take 1-2 tablets (5-10 mg total) by mouth every 3 (three) hours as needed. 04/09/13   Kelbi Renstrom, PA-C  polyethylene glycol (MIRALAX / GLYCOLAX) packet Take 17  g by mouth daily as needed. 04/09/13   Efrem Pitstick Julien Girt, PA-C  rivaroxaban (XARELTO) 10 MG TABS tablet Take 1 tablet (10 mg total) by mouth daily with breakfast. Take Xarelto for two and a half more weeks, then discontinue Xarelto. Once the patient has completed the Xarelto, they may resume the 81 mg Aspirin. 04/09/13   Evalise Abruzzese, PA-C  traMADol (ULTRAM) 50 MG tablet Take 1-2 tablets (50-100 mg total) by mouth every 6 (six) hours as needed (mild pain). 04/09/13   Bethan Adamek Julien Girt, PA-C    Diet: Cardiac diet and Diabetic diet Activity:WBAT Follow-up:in 2 weeks with Dr. Lequita Halt Disposition - Skilled nursing facility  - [facility] [place] Discharged Condition: good       Discharge Orders   Future Orders Complete By Expires     Call MD / Call 911  As directed     Comments:      If you experience chest pain or shortness of breath, CALL 911 and be transported to the hospital emergency room.  If you develope a fever above 101 F, pus (white drainage) or increased drainage or redness at the wound, or calf pain, call your surgeon's office.    Care order/instruction  As directed     Scheduling Instructions:      Continue CPAP at night at SNF.    Change dressing  As directed     Comments:      Change dressing daily with sterile 4 x 4 inch gauze dressing and apply TED hose. Do not submerge the incision under water.    Constipation Prevention  As directed     Comments:      Drink plenty of fluids.  Prune juice may be helpful.  You may use a stool softener, such as Colace (over the counter) 100 mg twice a day.  Use MiraLax (over the counter) for constipation as needed.    Diet - low sodium heart healthy  As directed     Diet Carb Modified  As directed     Discharge instructions  As directed     Comments:      Pick up stool softner and laxative for home. Do not submerge incision under water. May shower. Continue to use ice for pain and swelling from surgery.  Take Xarelto for  two and a half more weeks, then discontinue Xarelto. Once the patient has completed the Xarelto, they may resume the 81 mg Aspirin.  When discharged from the skilled rehab facility, please have the facility set up the patient's Home Health Physical Therapy prior to being released.  Also provide the patient with their medications at time of release from the facility to include their pain medication, the muscle relaxants, and their blood thinner medication.  If the patient is still at the rehab facility at time of follow up appointment, please also assist the patient in arranging follow up appointment in our office and any transportation needs.    Do not put a  pillow under the knee. Place it under the heel.  As directed     Do not sit on low chairs, stoools or toilet seats, as it may be difficult to get up from low surfaces  As directed     Driving restrictions  As directed     Comments:      No driving until released by the physician.    Increase activity slowly as tolerated  As directed     Lifting restrictions  As directed     Comments:      No lifting until released by the physician.    Patient may shower  As directed     Comments:      You may shower without a dressing once there is no drainage.  Do not wash over the wound.  If drainage remains, do not shower until drainage stops.    TED hose  As directed     Comments:      Use stockings (TED hose) for 3 weeks on both leg(s).  You may remove them at night for sleeping.    Weight bearing as tolerated  As directed         Medication List    STOP taking these medications       aspirin EC 81 MG tablet     beta carotene w/minerals tablet     naproxen sodium 220 MG tablet  Commonly known as:  ANAPROX      TAKE these medications       acetaminophen 500 MG tablet  Commonly known as:  TYLENOL  Take 1,000 mg by mouth every 6 (six) hours as needed for pain.     bisacodyl 10 MG suppository  Commonly known as:  DULCOLAX  Place 1  suppository (10 mg total) rectally daily as needed.     carvedilol 3.125 MG tablet  Commonly known as:  COREG  Take 3.125 mg by mouth 2 (two) times daily with a meal.     diphenhydrAMINE 12.5 MG/5ML elixir  Commonly known as:  BENADRYL  Take 5-10 mLs (12.5-25 mg total) by mouth every 4 (four) hours as needed for itching.     DSS 100 MG Caps  Take 100 mg by mouth 2 (two) times daily.     gabapentin 800 MG tablet  Commonly known as:  NEURONTIN  Take 800 mg by mouth 3 (three) times daily.     guaiFENesin 600 MG 12 hr tablet  Commonly known as:  MUCINEX  Take 1 tablet (600 mg total) by mouth 2 (two) times daily as needed.     insulin glargine 100 UNIT/ML injection  Commonly known as:  LANTUS  Inject 48 Units into the skin at bedtime.     lisinopril 20 MG tablet  Commonly known as:  PRINIVIL,ZESTRIL  Take 20 mg by mouth 2 (two) times daily.     methocarbamol 500 MG tablet  Commonly known as:  ROBAXIN  Take 1 tablet (500 mg total) by mouth every 6 (six) hours as needed.     metoCLOPramide 5 MG tablet  Commonly known as:  REGLAN  Take 1-2 tablets (5-10 mg total) by mouth every 8 (eight) hours as needed (if ondansetron (ZOFRAN) ineffective.).     ondansetron 4 MG tablet  Commonly known as:  ZOFRAN  Take 1 tablet (4 mg total) by mouth every 6 (six) hours as needed for nausea.     oxyCODONE 5 MG immediate release tablet  Commonly known as:  Oxy  IR/ROXICODONE  Take 1-2 tablets (5-10 mg total) by mouth every 3 (three) hours as needed.     PARoxetine 20 MG tablet  Commonly known as:  PAXIL  Take 20 mg by mouth every morning.     polyethylene glycol packet  Commonly known as:  MIRALAX / GLYCOLAX  Take 17 g by mouth daily as needed.     rivaroxaban 10 MG Tabs tablet  Commonly known as:  XARELTO  - Take 1 tablet (10 mg total) by mouth daily with breakfast. Take Xarelto for two and a half more weeks, then discontinue Xarelto.  - Once the patient has completed the Xarelto, they  may resume the 81 mg Aspirin.     traMADol 50 MG tablet  Commonly known as:  ULTRAM  Take 1-2 tablets (50-100 mg total) by mouth every 6 (six) hours as needed (mild pain).     traZODone 50 MG tablet  Commonly known as:  DESYREL  Take 50 mg by mouth at bedtime.       Follow-up Information   Follow up with Loanne Drilling, MD. Schedule an appointment as soon as possible for a visit on 04/21/2013. (Call [phone] tomorrow to make the appointment)    Contact information:   [address] [place] [address] [phone]       Schedule an appointment as soon as possible for a visit with Rolm Gala, MD. (Make appointment when discharged from SNF to determine continued use of the CPAP.)    Contact information:   [address] [place] [address] [phone]       Signed: Patrica Duel 04/09/2013, 9:09 AM

## 2013-04-09 NOTE — Progress Notes (Signed)
Pt for discharge this afternoon to [facility].  Facility requesting further information regarding pt CPAP and requested RN to speak with MD to get verbal order for continued CPAP and settings and have all information placed in one progress note because the separated information that this CSW sent facility did note suffice for company providing CPAP.  CSW notified unit charge nurse of need of progress note with information re: CPAP (verbal order form MD and CPAP settings).  CSW to send [facility] information when available.  [facility] asked that pt not be transported until information received in order to ensure that pt will have CPAP this evening.   CSW to continue to follow and facilitate pt discharge needs this afternoon.  Jacklynn Lewis, MSW, LCSWA  Clinical Social Work [phone]

## 2013-04-09 NOTE — Progress Notes (Signed)
   Subjective: 3 Days Post-Op Procedure(s) (LRB): LEFT TOTAL KNEE ARTHROPLASTY (Left) Patient reports pain as mild.   Patient seen in rounds with Dr. Lequita Halt. Patient is well, and has had no acute complaints or problems Patient is ready to go to the SNF - [facility] in [place], [place]  Objective: Vital signs in last 24 hours: Temp:  [97.8 F (36.6 C)-98.7 F (37.1 C)] 97.8 F (36.6 C) (07/24 0545) Pulse Rate:  [84-110] 91 (07/24 0545) Resp:  [16] 16 (07/24 0545) BP: (127-150)/(64-83) 142/83 mmHg (07/24 0545) SpO2:  [85 %-100 %] 94 % (07/24 0552)  Intake/Output from previous day:  Intake/Output Summary (Last 24 hours) at 04/09/13 0849 Last data filed at 04/08/13 2010  Gross per 24 hour  Intake    840 ml  Output   2750 ml  Net  -1910 ml    Intake/Output this shift:    Labs:  Recent Labs  04/07/13 0420 04/08/13 0440 04/09/13 0431  HGB 12.1 10.5* 10.4*    Recent Labs  04/08/13 0440 04/09/13 0431  WBC 12.3* 9.4  RBC 3.47* 3.58*  HCT 31.9* 33.2*  PLT 191 201    Recent Labs  04/07/13 0420 04/08/13 0440  NA 136 137  K 5.1 4.8  CL 98 99  CO2 30 35*  BUN 21 28*  CREATININE 0.91 0.87  GLUCOSE 228* 124*  CALCIUM 8.7 9.0   No results found for this basename: LABPT, INR,  in the last 72 hours  EXAM: General - Patient is Alert, Appropriate and Oriented Extremity - Neurovascular intact Sensation intact distally Dorsiflexion/Plantar flexion intact No cellulitis present Incision - clean, dry, no drainage, healing Motor Function - intact, moving foot and toes well on exam.   Assessment/Plan: 3 Days Post-Op Procedure(s) (LRB): LEFT TOTAL KNEE ARTHROPLASTY (Left) Procedure(s) (LRB): LEFT TOTAL KNEE ARTHROPLASTY (Left) Past Medical History  Diagnosis Date  . Macular degeneration   . Complication of anesthesia     " feel like I'm dying after I wake up"  . Hypertension   . Dysrhythmia     unknown type   . Asthma     no problems since 1999 (after moving  into a new home)  . Stroke 2010    verbal aphasia x 30 min - resolved - no problem since then  . Arthritis   . Fibromyalgia   . Itching   . GERD (gastroesophageal reflux disease)   . Nocturia   . Hx of transfusion 2011  . Diabetes mellitus without complication   . Sleep apnea     Dx 1990's unable to wear c-pap -  . Anxiety   . Diverticulosis   . Flat back syndrome    Principal Problem:   OA (osteoarthritis) of knee Active Problems:   Diabetes   Hypertension   Unspecified sleep apnea  Estimated body mass index is 42.06 kg/(m^2) as calculated from the following:   Height as of this encounter: 5\' 2"  (1.575 m).   Weight as of this encounter: 104.327 kg (230 lb). Up with therapy Discharge to SNF Diet - Cardiac diet and Diabetic diet Follow up - in 2 weeks Activity - WBAT Disposition - Skilled nursing facility Condition Upon Discharge - Good D/C Meds - See DC Summary DVT Prophylaxis - Xarelto  Cheyenne Frank 04/09/2013, 8:49 AM

## 2013-04-09 NOTE — Progress Notes (Signed)
CSW received notification from [facility] that facility received information regarding pt CPAP and is awaiting to hear from company that supplies CPAP to facility and will notify CSW when confirmation is received in order for CSW to arranged ambulance transportation.  Jacklynn Lewis, MSW, LCSWA  Clinical Social Work [phone]

## 2013-04-09 NOTE — Progress Notes (Signed)
CSW received confirmation from [facility] that transportation could be arranged for pt.  CSW facilitated pt discharge needs including ensuring [facility] had pt d/c information, discussing with pt at bedside, providing RN phone number to call report, and arranging ambulance transport (PTAR) for pt to [facility]	[place].   No further social work needs identified at this time.  CSW signing off.   Jacklynn Lewis, MSW, LCSWA  Clinical Social Work [phone]

## 2013-04-17 ENCOUNTER — Encounter: Payer: Self-pay | Admitting: Internal Medicine

## 2013-06-12 ENCOUNTER — Ambulatory Visit: Payer: Self-pay | Admitting: Emergency Medicine

## 2013-06-12 LAB — CBC WITH DIFFERENTIAL/PLATELET
Basophil #: 0.1 10*3/uL (ref 0.0–0.1)
Basophil %: 1 %
Eosinophil #: 0.2 10*3/uL (ref 0.0–0.7)
Eosinophil %: 2.6 %
HCT: 41 % (ref 35.0–47.0)
HGB: 13.2 g/dL (ref 12.0–16.0)
Lymphocyte #: 1.5 10*3/uL (ref 1.0–3.6)
Lymphocyte %: 18.7 %
MCH: 26.7 pg (ref 26.0–34.0)
MCHC: 32.1 g/dL (ref 32.0–36.0)
MCV: 83 fL (ref 80–100)
Monocyte #: 0.6 x10 3/mm (ref 0.2–0.9)
Monocyte %: 7.9 %
Neutrophil #: 5.6 10*3/uL (ref 1.4–6.5)
Neutrophil %: 69.8 %
Platelet: 222 10*3/uL (ref 150–440)
RBC: 4.93 10*6/uL (ref 3.80–5.20)
RDW: 15.4 % — ABNORMAL HIGH (ref 11.5–14.5)
WBC: 8.1 10*3/uL (ref 3.6–11.0)

## 2013-06-12 LAB — URIC ACID: Uric Acid: 5.8 mg/dL (ref 2.6–6.0)

## 2013-06-16 ENCOUNTER — Emergency Department: Payer: Self-pay | Admitting: Emergency Medicine

## 2013-06-16 LAB — TROPONIN I: Troponin-I: <0.02 ng/mL

## 2013-06-16 LAB — COMPREHENSIVE METABOLIC PANEL
Albumin: 3.5 g/dL (ref 3.4–5.0)
BUN: 21 mg/dL — ABNORMAL HIGH (ref 7–18)
Bilirubin,Total: 0.3 mg/dL (ref 0.2–1.0)
Calcium, Total: 9.1 mg/dL (ref 8.5–10.1)
Chloride: 103 mmol/L (ref 98–107)
Co2: 33 mmol/L — ABNORMAL HIGH (ref 21–32)
Creatinine: 1.06 mg/dL (ref 0.60–1.30)
EGFR (African American): >60
EGFR (Non-African Amer.): 53 — ABNORMAL LOW
Glucose: 117 mg/dL — ABNORMAL HIGH (ref 65–99)
Potassium: 4.4 mmol/L (ref 3.5–5.1)
SGOT(AST): 29 U/L (ref 15–37)
SGPT (ALT): 19 U/L (ref 12–78)

## 2013-06-16 LAB — CBC
HCT: 37 % (ref 35.0–47.0)
HGB: 12.2 g/dL (ref 12.0–16.0)
MCH: 27.5 pg (ref 26.0–34.0)
MCHC: 33.1 g/dL (ref 32.0–36.0)
Platelet: 211 10*3/uL (ref 150–440)
RBC: 4.45 10*6/uL (ref 3.80–5.20)
RDW: 15.8 % — ABNORMAL HIGH (ref 11.5–14.5)
WBC: 6.5 10*3/uL (ref 3.6–11.0)

## 2013-06-16 LAB — PRO B NATRIURETIC PEPTIDE: B-Type Natriuretic Peptide: 319 pg/mL — ABNORMAL HIGH (ref 0–125)

## 2013-06-16 LAB — CK TOTAL AND CKMB (NOT AT ARMC): CK, Total: 83 U/L (ref 21–215)

## 2013-09-17 HISTORY — PX: BURR HOLE FOR SUBDURAL HEMATOMA: SHX1275

## 2013-10-09 ENCOUNTER — Encounter: Payer: Self-pay | Admitting: Internal Medicine

## 2013-10-17 ENCOUNTER — Emergency Department: Payer: Self-pay | Admitting: Emergency Medicine

## 2013-10-17 LAB — URINALYSIS, COMPLETE
BILIRUBIN, UR: NEGATIVE
Bacteria: NONE SEEN
Blood: NEGATIVE
GLUCOSE, UR: NEGATIVE mg/dL (ref 0–75)
Ketone: NEGATIVE
Leukocyte Esterase: NEGATIVE
Nitrite: NEGATIVE
PH: 5 (ref 4.5–8.0)
Protein: NEGATIVE
RBC,UR: 2 /HPF (ref 0–5)
SPECIFIC GRAVITY: 1.016 (ref 1.003–1.030)
WBC UR: NONE SEEN /HPF (ref 0–5)

## 2013-10-17 LAB — COMPREHENSIVE METABOLIC PANEL
Albumin: 3 g/dL — ABNORMAL LOW (ref 3.4–5.0)
Alkaline Phosphatase: 121 U/L — ABNORMAL HIGH
Anion Gap: 2 — ABNORMAL LOW (ref 7–16)
BILIRUBIN TOTAL: 0.4 mg/dL (ref 0.2–1.0)
BUN: 21 mg/dL — AB (ref 7–18)
CHLORIDE: 98 mmol/L (ref 98–107)
Calcium, Total: 9 mg/dL (ref 8.5–10.1)
Co2: 34 mmol/L — ABNORMAL HIGH (ref 21–32)
Creatinine: 1.12 mg/dL (ref 0.60–1.30)
GFR CALC AF AMER: 57 — AB
GFR CALC NON AF AMER: 49 — AB
Glucose: 149 mg/dL — ABNORMAL HIGH (ref 65–99)
OSMOLALITY: 274 (ref 275–301)
Potassium: 4.4 mmol/L (ref 3.5–5.1)
SGOT(AST): 26 U/L (ref 15–37)
SGPT (ALT): 47 U/L (ref 12–78)
SODIUM: 134 mmol/L — AB (ref 136–145)
Total Protein: 7.3 g/dL (ref 6.4–8.2)

## 2013-10-17 LAB — CBC
HCT: 35.7 % (ref 35.0–47.0)
HGB: 11.5 g/dL — AB (ref 12.0–16.0)
MCH: 28.5 pg (ref 26.0–34.0)
MCHC: 32.3 g/dL (ref 32.0–36.0)
MCV: 88 fL (ref 80–100)
PLATELETS: 347 10*3/uL (ref 150–440)
RBC: 4.04 10*6/uL (ref 3.80–5.20)
RDW: 15.9 % — ABNORMAL HIGH (ref 11.5–14.5)
WBC: 13.8 10*3/uL — AB (ref 3.6–11.0)

## 2013-10-17 LAB — RAPID INFLUENZA A&B ANTIGENS

## 2013-10-18 ENCOUNTER — Encounter: Payer: Self-pay | Admitting: Internal Medicine

## 2013-10-19 LAB — CULTURE, BLOOD (SINGLE)

## 2013-11-06 ENCOUNTER — Inpatient Hospital Stay
Admission: AD | Admit: 2013-11-06 | Discharge: 2013-12-02 | Disposition: A | Discharge status: Skilled Nursing Facility | Payer: Medicare Other | Source: Ambulatory Visit | Attending: Internal Medicine | Admitting: Internal Medicine

## 2013-11-06 LAB — COMPREHENSIVE METABOLIC PANEL
ALT: 17 U/L (ref 0–35)
AST: 16 U/L (ref 0–37)
Albumin: 2.4 g/dL — ABNORMAL LOW (ref 3.5–5.2)
Alkaline Phosphatase: 127 U/L — ABNORMAL HIGH (ref 39–117)
BUN: 15 mg/dL (ref 6–23)
CALCIUM: 8.9 mg/dL (ref 8.4–10.5)
CO2: 33 meq/L — AB (ref 19–32)
Chloride: 99 mEq/L (ref 96–112)
Creatinine, Ser: 0.79 mg/dL (ref 0.50–1.10)
GFR calc Af Amer: >90 mL/min (ref >90)
GFR calc non Af Amer: 81 mL/min — ABNORMAL LOW (ref >90)
Glucose, Bld: 209 mg/dL — ABNORMAL HIGH (ref 70–99)
Potassium: 4.7 mEq/L (ref 3.7–5.3)
SODIUM: 141 meq/L (ref 137–147)
TOTAL PROTEIN: 6.7 g/dL (ref 6.0–8.3)
Total Bilirubin: 0.2 mg/dL — ABNORMAL LOW (ref 0.3–1.2)

## 2013-11-07 ENCOUNTER — Other Ambulatory Visit (HOSPITAL_COMMUNITY): Payer: Medicare Other

## 2013-11-07 LAB — PREALBUMIN: Prealbumin: 17.2 mg/dL — ABNORMAL LOW (ref 17.0–34.0)

## 2013-11-07 LAB — CBC
HCT: 29.3 % — ABNORMAL LOW (ref 36.0–46.0)
Hemoglobin: 8.9 g/dL — ABNORMAL LOW (ref 12.0–15.0)
MCH: 27.2 pg (ref 26.0–34.0)
MCHC: 30.4 g/dL (ref 30.0–36.0)
MCV: 89.6 fL (ref 78.0–100.0)
PLATELETS: 298 10*3/uL (ref 150–400)
RBC: 3.27 MIL/uL — AB (ref 3.87–5.11)
RDW: 15.6 % — ABNORMAL HIGH (ref 11.5–15.5)
WBC: 5.9 10*3/uL (ref 4.0–10.5)

## 2013-11-07 LAB — TSH: TSH: 2.925 u[IU]/mL (ref 0.350–4.500)

## 2013-11-07 LAB — PROCALCITONIN: Procalcitonin: <0.1 ng/mL

## 2013-11-08 ENCOUNTER — Other Ambulatory Visit (HOSPITAL_COMMUNITY): Payer: Medicare Other

## 2013-11-10 ENCOUNTER — Other Ambulatory Visit (HOSPITAL_COMMUNITY): Payer: Medicare Other

## 2013-11-10 LAB — BASIC METABOLIC PANEL
BUN: 13 mg/dL (ref 6–23)
CO2: 31 meq/L (ref 19–32)
CREATININE: 0.66 mg/dL (ref 0.50–1.10)
Calcium: 9.1 mg/dL (ref 8.4–10.5)
Chloride: 101 mEq/L (ref 96–112)
GFR, EST NON AFRICAN AMERICAN: 86 mL/min — AB (ref >90)
Glucose, Bld: 106 mg/dL — ABNORMAL HIGH (ref 70–99)
Potassium: 4.1 mEq/L (ref 3.7–5.3)
SODIUM: 144 meq/L (ref 137–147)

## 2013-11-10 LAB — CBC
HCT: 28.4 % — ABNORMAL LOW (ref 36.0–46.0)
Hemoglobin: 8.8 g/dL — ABNORMAL LOW (ref 12.0–15.0)
MCH: 27.3 pg (ref 26.0–34.0)
MCHC: 31 g/dL (ref 30.0–36.0)
MCV: 88.2 fL (ref 78.0–100.0)
Platelets: 289 10*3/uL (ref 150–400)
RBC: 3.22 MIL/uL — ABNORMAL LOW (ref 3.87–5.11)
RDW: 15.2 % (ref 11.5–15.5)
WBC: 5.7 10*3/uL (ref 4.0–10.5)

## 2013-11-11 ENCOUNTER — Other Ambulatory Visit (HOSPITAL_COMMUNITY): Payer: Medicare Other

## 2013-11-11 LAB — BLOOD GAS, ARTERIAL
ACID-BASE EXCESS: 6 mmol/L — AB (ref 0.0–2.0)
Bicarbonate: 30.2 mEq/L — ABNORMAL HIGH (ref 20.0–24.0)
DELIVERY SYSTEMS: POSITIVE
FIO2: 0.21 %
INSPIRATORY PAP: 10
Mode: POSITIVE
O2 SAT: 98 %
PATIENT TEMPERATURE: 98.6
PO2 ART: 89.8 mmHg (ref 80.0–100.0)
TCO2: 31.6 mmol/L (ref 0–100)
pCO2 arterial: 45.8 mmHg — ABNORMAL HIGH (ref 35.0–45.0)
pH, Arterial: 7.435 (ref 7.350–7.450)

## 2013-11-11 LAB — CBC WITH DIFFERENTIAL/PLATELET
BASOS PCT: 0 % (ref 0–1)
Basophils Absolute: 0 10*3/uL (ref 0.0–0.1)
EOS PCT: 4 % (ref 0–5)
Eosinophils Absolute: 0.3 10*3/uL (ref 0.0–0.7)
HCT: 28.8 % — ABNORMAL LOW (ref 36.0–46.0)
Hemoglobin: 8.9 g/dL — ABNORMAL LOW (ref 12.0–15.0)
LYMPHS ABS: 1.7 10*3/uL (ref 0.7–4.0)
Lymphocytes Relative: 27 % (ref 12–46)
MCH: 27.1 pg (ref 26.0–34.0)
MCHC: 30.9 g/dL (ref 30.0–36.0)
MCV: 87.8 fL (ref 78.0–100.0)
MONOS PCT: 11 % (ref 3–12)
Monocytes Absolute: 0.7 10*3/uL (ref 0.1–1.0)
Neutro Abs: 3.8 10*3/uL (ref 1.7–7.7)
Neutrophils Relative %: 58 % (ref 43–77)
Platelets: 318 10*3/uL (ref 150–400)
RBC: 3.28 MIL/uL — AB (ref 3.87–5.11)
RDW: 15.2 % (ref 11.5–15.5)
WBC: 6.5 10*3/uL (ref 4.0–10.5)

## 2013-11-11 LAB — TSH: TSH: 2.614 u[IU]/mL (ref 0.350–4.500)

## 2013-11-11 LAB — BASIC METABOLIC PANEL
BUN: 22 mg/dL (ref 6–23)
CALCIUM: 9.4 mg/dL (ref 8.4–10.5)
CO2: 31 mEq/L (ref 19–32)
Chloride: 99 mEq/L (ref 96–112)
Creatinine, Ser: 0.8 mg/dL (ref 0.50–1.10)
GFR, EST AFRICAN AMERICAN: 83 mL/min — AB (ref >90)
GFR, EST NON AFRICAN AMERICAN: 72 mL/min — AB (ref >90)
GLUCOSE: 127 mg/dL — AB (ref 70–99)
Potassium: 4.4 mEq/L (ref 3.7–5.3)
Sodium: 142 mEq/L (ref 137–147)

## 2013-11-11 LAB — CK TOTAL AND CKMB (NOT AT ARMC)
CK, MB: 1.3 ng/mL (ref 0.3–4.0)
Relative Index: INVALID (ref 0.0–2.5)
Total CK: 16 U/L (ref 7–177)

## 2013-11-11 LAB — PROCALCITONIN: Procalcitonin: <0.1 ng/mL

## 2013-11-11 LAB — TROPONIN I: Troponin I: <0.3 ng/mL (ref <0.30)

## 2013-11-13 LAB — LEVETIRACETAM LEVEL: Levetiracetam Lvl: 59.7 ug/mL — ABNORMAL HIGH (ref 5.0–30.0)

## 2013-11-14 LAB — C-REACTIVE PROTEIN: CRP: 15.5 mg/dL — ABNORMAL HIGH (ref <0.60)

## 2013-11-14 LAB — SEDIMENTATION RATE: Sed Rate: 125 mm/hr — ABNORMAL HIGH (ref 0–22)

## 2013-11-16 LAB — BASIC METABOLIC PANEL
BUN: 28 mg/dL — ABNORMAL HIGH (ref 6–23)
CO2: 28 mEq/L (ref 19–32)
Calcium: 9.6 mg/dL (ref 8.4–10.5)
Chloride: 100 mEq/L (ref 96–112)
Creatinine, Ser: 0.71 mg/dL (ref 0.50–1.10)
GFR calc Af Amer: >90 mL/min (ref >90)
GFR, EST NON AFRICAN AMERICAN: 84 mL/min — AB (ref >90)
Glucose, Bld: 158 mg/dL — ABNORMAL HIGH (ref 70–99)
Potassium: 4.6 mEq/L (ref 3.7–5.3)
Sodium: 140 mEq/L (ref 137–147)

## 2013-11-16 LAB — CBC
HCT: 29.5 % — ABNORMAL LOW (ref 36.0–46.0)
Hemoglobin: 9.2 g/dL — ABNORMAL LOW (ref 12.0–15.0)
MCH: 27.1 pg (ref 26.0–34.0)
MCHC: 31.2 g/dL (ref 30.0–36.0)
MCV: 86.8 fL (ref 78.0–100.0)
Platelets: 357 10*3/uL (ref 150–400)
RBC: 3.4 MIL/uL — ABNORMAL LOW (ref 3.87–5.11)
RDW: 15.2 % (ref 11.5–15.5)
WBC: 6.3 10*3/uL (ref 4.0–10.5)

## 2013-11-17 LAB — CULTURE, BLOOD (ROUTINE X 2)
CULTURE: NO GROWTH
Culture: NO GROWTH

## 2013-11-17 LAB — LEVETIRACETAM LEVEL: LEVETIRACETAM: 73.3 ug/mL — AB (ref 5.0–30.0)

## 2013-11-19 LAB — BASIC METABOLIC PANEL
BUN: 24 mg/dL — ABNORMAL HIGH (ref 6–23)
CHLORIDE: 101 meq/L (ref 96–112)
CO2: 27 mEq/L (ref 19–32)
CREATININE: 0.71 mg/dL (ref 0.50–1.10)
Calcium: 9.5 mg/dL (ref 8.4–10.5)
GFR, EST NON AFRICAN AMERICAN: 84 mL/min — AB (ref >90)
Glucose, Bld: 111 mg/dL — ABNORMAL HIGH (ref 70–99)
Potassium: 4.5 mEq/L (ref 3.7–5.3)
Sodium: 142 mEq/L (ref 137–147)

## 2013-11-19 LAB — CBC
HCT: 27.4 % — ABNORMAL LOW (ref 36.0–46.0)
Hemoglobin: 8.5 g/dL — ABNORMAL LOW (ref 12.0–15.0)
MCH: 26.9 pg (ref 26.0–34.0)
MCHC: 31 g/dL (ref 30.0–36.0)
MCV: 86.7 fL (ref 78.0–100.0)
PLATELETS: 351 10*3/uL (ref 150–400)
RBC: 3.16 MIL/uL — ABNORMAL LOW (ref 3.87–5.11)
RDW: 15.3 % (ref 11.5–15.5)
WBC: 6 10*3/uL (ref 4.0–10.5)

## 2013-11-23 ENCOUNTER — Other Ambulatory Visit (HOSPITAL_COMMUNITY): Payer: Medicare Other

## 2013-11-23 LAB — CBC WITH DIFFERENTIAL/PLATELET
BASOS ABS: 0 10*3/uL (ref 0.0–0.1)
BASOS PCT: 0 % (ref 0–1)
EOS ABS: 0.3 10*3/uL (ref 0.0–0.7)
Eosinophils Relative: 3 % (ref 0–5)
HCT: 32 % — ABNORMAL LOW (ref 36.0–46.0)
Hemoglobin: 9.6 g/dL — ABNORMAL LOW (ref 12.0–15.0)
Lymphocytes Relative: 21 % (ref 12–46)
Lymphs Abs: 1.7 10*3/uL (ref 0.7–4.0)
MCH: 25.9 pg — AB (ref 26.0–34.0)
MCHC: 30 g/dL (ref 30.0–36.0)
MCV: 86.5 fL (ref 78.0–100.0)
Monocytes Absolute: 0.9 10*3/uL (ref 0.1–1.0)
Monocytes Relative: 11 % (ref 3–12)
NEUTROS PCT: 65 % (ref 43–77)
Neutro Abs: 5.3 10*3/uL (ref 1.7–7.7)
PLATELETS: 412 10*3/uL — AB (ref 150–400)
RBC: 3.7 MIL/uL — AB (ref 3.87–5.11)
RDW: 15.4 % (ref 11.5–15.5)
WBC: 8.1 10*3/uL (ref 4.0–10.5)

## 2013-11-23 LAB — COMPREHENSIVE METABOLIC PANEL
ALBUMIN: 2.9 g/dL — AB (ref 3.5–5.2)
ALT: 13 U/L (ref 0–35)
AST: 16 U/L (ref 0–37)
Alkaline Phosphatase: 135 U/L — ABNORMAL HIGH (ref 39–117)
BUN: 33 mg/dL — ABNORMAL HIGH (ref 6–23)
CO2: 29 mEq/L (ref 19–32)
Calcium: 9.8 mg/dL (ref 8.4–10.5)
Chloride: 100 mEq/L (ref 96–112)
Creatinine, Ser: 0.96 mg/dL (ref 0.50–1.10)
GFR calc Af Amer: 67 mL/min — ABNORMAL LOW (ref >90)
GFR calc non Af Amer: 58 mL/min — ABNORMAL LOW (ref >90)
Glucose, Bld: 142 mg/dL — ABNORMAL HIGH (ref 70–99)
Potassium: 5.1 mEq/L (ref 3.7–5.3)
SODIUM: 142 meq/L (ref 137–147)
TOTAL PROTEIN: 7.7 g/dL (ref 6.0–8.3)
Total Bilirubin: <0.2 mg/dL — ABNORMAL LOW (ref 0.3–1.2)

## 2013-11-23 LAB — PREALBUMIN: Prealbumin: 22.3 mg/dL (ref 17.0–34.0)

## 2013-11-26 LAB — CBC WITH DIFFERENTIAL/PLATELET
BASOS ABS: 0.1 10*3/uL (ref 0.0–0.1)
Basophils Relative: 1 % (ref 0–1)
Eosinophils Absolute: 0.3 10*3/uL (ref 0.0–0.7)
Eosinophils Relative: 5 % (ref 0–5)
HEMATOCRIT: 28.5 % — AB (ref 36.0–46.0)
HEMOGLOBIN: 8.7 g/dL — AB (ref 12.0–15.0)
LYMPHS ABS: 1.4 10*3/uL (ref 0.7–4.0)
Lymphocytes Relative: 23 % (ref 12–46)
MCH: 25.9 pg — ABNORMAL LOW (ref 26.0–34.0)
MCHC: 30.5 g/dL (ref 30.0–36.0)
MCV: 84.8 fL (ref 78.0–100.0)
Monocytes Absolute: 0.8 10*3/uL (ref 0.1–1.0)
Monocytes Relative: 13 % — ABNORMAL HIGH (ref 3–12)
NEUTROS ABS: 3.5 10*3/uL (ref 1.7–7.7)
Neutrophils Relative %: 58 % (ref 43–77)
Platelets: 348 10*3/uL (ref 150–400)
RBC: 3.36 MIL/uL — ABNORMAL LOW (ref 3.87–5.11)
RDW: 15.4 % (ref 11.5–15.5)
WBC: 6.1 10*3/uL (ref 4.0–10.5)

## 2013-11-26 LAB — BASIC METABOLIC PANEL
BUN: 27 mg/dL — AB (ref 6–23)
CHLORIDE: 101 meq/L (ref 96–112)
CO2: 27 meq/L (ref 19–32)
CREATININE: 0.76 mg/dL (ref 0.50–1.10)
Calcium: 9.6 mg/dL (ref 8.4–10.5)
GFR calc Af Amer: >90 mL/min (ref >90)
GFR calc non Af Amer: 82 mL/min — ABNORMAL LOW (ref >90)
Glucose, Bld: 133 mg/dL — ABNORMAL HIGH (ref 70–99)
POTASSIUM: 5.1 meq/L (ref 3.7–5.3)
Sodium: 141 mEq/L (ref 137–147)

## 2013-11-29 LAB — CBC WITH DIFFERENTIAL/PLATELET
BASOS PCT: 0 % (ref 0–1)
Basophils Absolute: 0 10*3/uL (ref 0.0–0.1)
Eosinophils Absolute: 0.3 10*3/uL (ref 0.0–0.7)
Eosinophils Relative: 6 % — ABNORMAL HIGH (ref 0–5)
HEMATOCRIT: 29 % — AB (ref 36.0–46.0)
Hemoglobin: 8.9 g/dL — ABNORMAL LOW (ref 12.0–15.0)
Lymphocytes Relative: 24 % (ref 12–46)
Lymphs Abs: 1.1 10*3/uL (ref 0.7–4.0)
MCH: 26.2 pg (ref 26.0–34.0)
MCHC: 30.7 g/dL (ref 30.0–36.0)
MCV: 85.3 fL (ref 78.0–100.0)
MONOS PCT: 15 % — AB (ref 3–12)
Monocytes Absolute: 0.7 10*3/uL (ref 0.1–1.0)
NEUTROS PCT: 54 % (ref 43–77)
Neutro Abs: 2.5 10*3/uL (ref 1.7–7.7)
Platelets: 287 10*3/uL (ref 150–400)
RBC: 3.4 MIL/uL — AB (ref 3.87–5.11)
RDW: 15.5 % (ref 11.5–15.5)
WBC: 4.6 10*3/uL (ref 4.0–10.5)

## 2013-11-29 LAB — BASIC METABOLIC PANEL
BUN: 26 mg/dL — AB (ref 6–23)
CO2: 25 mEq/L (ref 19–32)
CREATININE: 0.68 mg/dL (ref 0.50–1.10)
Calcium: 9.6 mg/dL (ref 8.4–10.5)
Chloride: 100 mEq/L (ref 96–112)
GFR, EST NON AFRICAN AMERICAN: 85 mL/min — AB (ref >90)
GLUCOSE: 117 mg/dL — AB (ref 70–99)
Potassium: 4.7 mEq/L (ref 3.7–5.3)
Sodium: 141 mEq/L (ref 137–147)

## 2013-12-01 ENCOUNTER — Encounter: Payer: Self-pay | Admitting: Internal Medicine

## 2013-12-09 LAB — URINALYSIS, COMPLETE
Bacteria: NONE SEEN
Bilirubin,UR: NEGATIVE
Blood: NEGATIVE
GLUCOSE, UR: NEGATIVE mg/dL (ref 0–75)
KETONE: NEGATIVE
Leukocyte Esterase: NEGATIVE
NITRITE: NEGATIVE
PH: 5 (ref 4.5–8.0)
Protein: NEGATIVE
RBC,UR: 4 /HPF (ref 0–5)
SPECIFIC GRAVITY: 1.018 (ref 1.003–1.030)
Squamous Epithelial: 2
WBC UR: 2 /HPF (ref 0–5)

## 2013-12-09 LAB — IRON AND TIBC
IRON SATURATION: 11 %
IRON: 37 ug/dL — AB (ref 50–170)
Iron Bind.Cap.(Total): 341 ug/dL (ref 250–450)
Unbound Iron-Bind.Cap.: 304 ug/dL

## 2013-12-09 LAB — CBC WITH DIFFERENTIAL/PLATELET
Basophil #: 0 10*3/uL (ref 0.0–0.1)
Basophil %: 1.4 %
EOS ABS: 0.3 10*3/uL (ref 0.0–0.7)
Eosinophil %: 12.9 %
HCT: 29.8 % — ABNORMAL LOW (ref 35.0–47.0)
HGB: 9.6 g/dL — ABNORMAL LOW (ref 12.0–16.0)
Lymphocyte #: 0.9 10*3/uL — ABNORMAL LOW (ref 1.0–3.6)
Lymphocyte %: 35.5 %
MCH: 26.6 pg (ref 26.0–34.0)
MCHC: 32.1 g/dL (ref 32.0–36.0)
MCV: 83 fL (ref 80–100)
Monocyte #: 0.6 x10 3/mm (ref 0.2–0.9)
Monocyte %: 22.1 %
NEUTROS ABS: 0.7 10*3/uL — AB (ref 1.4–6.5)
NEUTROS PCT: 28.1 %
Platelet: 334 10*3/uL (ref 150–440)
RBC: 3.6 10*6/uL — AB (ref 3.80–5.20)
RDW: 17.5 % — ABNORMAL HIGH (ref 11.5–14.5)
WBC: 2.5 10*3/uL — AB (ref 3.6–11.0)

## 2013-12-09 LAB — BASIC METABOLIC PANEL
Anion Gap: 7 (ref 7–16)
BUN: 43 mg/dL — AB (ref 7–18)
CALCIUM: 9.1 mg/dL (ref 8.5–10.1)
Chloride: 102 mmol/L (ref 98–107)
Co2: 29 mmol/L (ref 21–32)
Creatinine: 1.2 mg/dL (ref 0.60–1.30)
GFR CALC AF AMER: 52 — AB
GFR CALC NON AF AMER: 45 — AB
Glucose: 193 mg/dL — ABNORMAL HIGH (ref 65–99)
Osmolality: 292 (ref 275–301)
POTASSIUM: 4.4 mmol/L (ref 3.5–5.1)
SODIUM: 138 mmol/L (ref 136–145)

## 2013-12-09 LAB — TROPONIN I: Troponin-I: <0.02 ng/mL

## 2013-12-09 LAB — FERRITIN: FERRITIN (ARMC): 105 ng/mL (ref 8–388)

## 2013-12-10 ENCOUNTER — Inpatient Hospital Stay: Payer: Self-pay | Admitting: Internal Medicine

## 2013-12-10 LAB — CBC WITH DIFFERENTIAL/PLATELET
Basophil #: 0 10*3/uL (ref 0.0–0.1)
Basophil %: 1.4 %
EOS ABS: 0.3 10*3/uL (ref 0.0–0.7)
Eosinophil %: 12.4 %
HCT: 25.3 % — ABNORMAL LOW (ref 35.0–47.0)
HGB: 8 g/dL — ABNORMAL LOW (ref 12.0–16.0)
LYMPHS ABS: 1.2 10*3/uL (ref 1.0–3.6)
Lymphocyte %: 45.4 %
MCH: 26.2 pg (ref 26.0–34.0)
MCHC: 31.5 g/dL — AB (ref 32.0–36.0)
MCV: 83 fL (ref 80–100)
MONO ABS: 0.7 x10 3/mm (ref 0.2–0.9)
Monocyte %: 26.4 %
Neutrophil #: 0.4 10*3/uL — ABNORMAL LOW (ref 1.4–6.5)
Neutrophil %: 14.4 %
Platelet: 285 10*3/uL (ref 150–440)
RBC: 3.04 10*6/uL — ABNORMAL LOW (ref 3.80–5.20)
RDW: 17.4 % — AB (ref 11.5–14.5)
WBC: 2.6 10*3/uL — ABNORMAL LOW (ref 3.6–11.0)

## 2013-12-10 LAB — BASIC METABOLIC PANEL
Anion Gap: 3 — ABNORMAL LOW (ref 7–16)
BUN: 25 mg/dL — AB (ref 7–18)
CALCIUM: 8.4 mg/dL — AB (ref 8.5–10.1)
Chloride: 113 mmol/L — ABNORMAL HIGH (ref 98–107)
Co2: 26 mmol/L (ref 21–32)
Creatinine: 0.87 mg/dL (ref 0.60–1.30)
Glucose: 106 mg/dL — ABNORMAL HIGH (ref 65–99)
Osmolality: 288 (ref 275–301)
Potassium: 4.5 mmol/L (ref 3.5–5.1)
SODIUM: 142 mmol/L (ref 136–145)

## 2013-12-10 LAB — MAGNESIUM: Magnesium: 1.9 mg/dL

## 2013-12-11 ENCOUNTER — Encounter (HOSPITAL_COMMUNITY): Payer: Self-pay | Admitting: Physical Medicine and Rehabilitation

## 2013-12-11 ENCOUNTER — Inpatient Hospital Stay (HOSPITAL_COMMUNITY)
Admission: RE | Admit: 2013-12-11 | Payer: Medicare Other | Source: Intra-hospital | Admitting: Physical Medicine & Rehabilitation

## 2013-12-11 ENCOUNTER — Inpatient Hospital Stay (HOSPITAL_COMMUNITY)
Admission: AD | Admit: 2013-12-11 | Discharge: 2013-12-24 | DRG: 945 | Disposition: A | Discharge status: Home / Self Care | Payer: Medicare Other | Source: Intra-hospital | Attending: Physical Medicine & Rehabilitation | Admitting: Physical Medicine & Rehabilitation

## 2013-12-11 ENCOUNTER — Encounter: Payer: Self-pay | Admitting: Physical Medicine and Rehabilitation

## 2013-12-11 ENCOUNTER — Other Ambulatory Visit: Payer: Self-pay | Admitting: Physical Medicine and Rehabilitation

## 2013-12-11 ENCOUNTER — Encounter: Payer: Self-pay | Admitting: *Deleted

## 2013-12-11 DIAGNOSIS — S065X9A Traumatic subdural hemorrhage with loss of consciousness of unspecified duration, initial encounter: Secondary | ICD-10-CM | POA: Diagnosis present

## 2013-12-11 DIAGNOSIS — M549 Dorsalgia, unspecified: Secondary | ICD-10-CM | POA: Diagnosis present

## 2013-12-11 DIAGNOSIS — E1165 Type 2 diabetes mellitus with hyperglycemia: Secondary | ICD-10-CM | POA: Diagnosis present

## 2013-12-11 DIAGNOSIS — S065XAA Traumatic subdural hemorrhage with loss of consciousness status unknown, initial encounter: Secondary | ICD-10-CM | POA: Diagnosis present

## 2013-12-11 DIAGNOSIS — G2581 Restless legs syndrome: Secondary | ICD-10-CM | POA: Diagnosis present

## 2013-12-11 DIAGNOSIS — I10 Essential (primary) hypertension: Secondary | ICD-10-CM | POA: Diagnosis present

## 2013-12-11 DIAGNOSIS — E1142 Type 2 diabetes mellitus with diabetic polyneuropathy: Secondary | ICD-10-CM | POA: Diagnosis present

## 2013-12-11 DIAGNOSIS — Z96659 Presence of unspecified artificial knee joint: Secondary | ICD-10-CM

## 2013-12-11 DIAGNOSIS — N390 Urinary tract infection, site not specified: Secondary | ICD-10-CM | POA: Diagnosis not present

## 2013-12-11 DIAGNOSIS — E119 Type 2 diabetes mellitus without complications: Secondary | ICD-10-CM

## 2013-12-11 DIAGNOSIS — T8149XA Infection following a procedure, other surgical site, initial encounter: Secondary | ICD-10-CM | POA: Diagnosis present

## 2013-12-11 DIAGNOSIS — R41841 Cognitive communication deficit: Secondary | ICD-10-CM | POA: Diagnosis present

## 2013-12-11 DIAGNOSIS — R0989 Other specified symptoms and signs involving the circulatory and respiratory systems: Secondary | ICD-10-CM | POA: Diagnosis present

## 2013-12-11 DIAGNOSIS — D509 Iron deficiency anemia, unspecified: Secondary | ICD-10-CM

## 2013-12-11 DIAGNOSIS — B965 Pseudomonas (aeruginosa) (mallei) (pseudomallei) as the cause of diseases classified elsewhere: Secondary | ICD-10-CM | POA: Diagnosis not present

## 2013-12-11 DIAGNOSIS — J449 Chronic obstructive pulmonary disease, unspecified: Secondary | ICD-10-CM | POA: Diagnosis present

## 2013-12-11 DIAGNOSIS — Z5189 Encounter for other specified aftercare: Principal | ICD-10-CM

## 2013-12-11 DIAGNOSIS — G4733 Obstructive sleep apnea (adult) (pediatric): Secondary | ICD-10-CM | POA: Diagnosis present

## 2013-12-11 DIAGNOSIS — I639 Cerebral infarction, unspecified: Secondary | ICD-10-CM | POA: Diagnosis present

## 2013-12-11 DIAGNOSIS — D638 Anemia in other chronic diseases classified elsewhere: Secondary | ICD-10-CM | POA: Diagnosis present

## 2013-12-11 DIAGNOSIS — G8929 Other chronic pain: Secondary | ICD-10-CM | POA: Diagnosis present

## 2013-12-11 DIAGNOSIS — I959 Hypotension, unspecified: Secondary | ICD-10-CM | POA: Diagnosis not present

## 2013-12-11 DIAGNOSIS — I6350 Cerebral infarction due to unspecified occlusion or stenosis of unspecified cerebral artery: Secondary | ICD-10-CM

## 2013-12-11 DIAGNOSIS — D62 Acute posthemorrhagic anemia: Secondary | ICD-10-CM | POA: Diagnosis present

## 2013-12-11 DIAGNOSIS — Z8673 Personal history of transient ischemic attack (TIA), and cerebral infarction without residual deficits: Secondary | ICD-10-CM

## 2013-12-11 DIAGNOSIS — J4489 Other specified chronic obstructive pulmonary disease: Secondary | ICD-10-CM | POA: Diagnosis present

## 2013-12-11 DIAGNOSIS — H3530 Unspecified macular degeneration: Secondary | ICD-10-CM | POA: Diagnosis present

## 2013-12-11 DIAGNOSIS — M179 Osteoarthritis of knee, unspecified: Secondary | ICD-10-CM

## 2013-12-11 DIAGNOSIS — M1710 Unilateral primary osteoarthritis, unspecified knee: Secondary | ICD-10-CM | POA: Diagnosis present

## 2013-12-11 DIAGNOSIS — D709 Neutropenia, unspecified: Secondary | ICD-10-CM | POA: Diagnosis present

## 2013-12-11 DIAGNOSIS — I6200 Nontraumatic subdural hemorrhage, unspecified: Secondary | ICD-10-CM | POA: Diagnosis present

## 2013-12-11 DIAGNOSIS — R0609 Other forms of dyspnea: Secondary | ICD-10-CM | POA: Diagnosis present

## 2013-12-11 DIAGNOSIS — IMO0001 Reserved for inherently not codable concepts without codable children: Secondary | ICD-10-CM | POA: Diagnosis present

## 2013-12-11 LAB — GLUCOSE, CAPILLARY
GLUCOSE-CAPILLARY: 158 mg/dL — AB (ref 70–99)
Glucose-Capillary: 112 mg/dL — ABNORMAL HIGH (ref 70–99)

## 2013-12-11 MED ORDER — INSULIN ASPART 100 UNIT/ML ~~LOC~~ SOLN
0.0000 [IU] | Freq: Three times a day (TID) | SUBCUTANEOUS | Status: DC
  Administered 2013-12-11 – 2013-12-12 (×2): 2 [IU] via SUBCUTANEOUS
  Administered 2013-12-12: 1 [IU] via SUBCUTANEOUS
  Administered 2013-12-13 (×2): 2 [IU] via SUBCUTANEOUS
  Administered 2013-12-14 (×2): 1 [IU] via SUBCUTANEOUS
  Administered 2013-12-15: 2 [IU] via SUBCUTANEOUS
  Administered 2013-12-15 – 2013-12-17 (×4): 1 [IU] via SUBCUTANEOUS
  Administered 2013-12-17: 2 [IU] via SUBCUTANEOUS
  Administered 2013-12-18 – 2013-12-19 (×2): 1 [IU] via SUBCUTANEOUS
  Administered 2013-12-20: 2 [IU] via SUBCUTANEOUS
  Administered 2013-12-20: 1 [IU] via SUBCUTANEOUS
  Administered 2013-12-20 – 2013-12-22 (×4): 2 [IU] via SUBCUTANEOUS
  Administered 2013-12-22: 1 [IU] via SUBCUTANEOUS
  Administered 2013-12-22: 2 [IU] via SUBCUTANEOUS
  Administered 2013-12-23: 3 [IU] via SUBCUTANEOUS
  Administered 2013-12-23 – 2013-12-24 (×2): 1 [IU] via SUBCUTANEOUS

## 2013-12-11 MED ORDER — INSULIN DETEMIR 100 UNIT/ML ~~LOC~~ SOLN
24.0000 [IU] | Freq: Every day | SUBCUTANEOUS | Status: DC
  Administered 2013-12-11 – 2013-12-23 (×13): 24 [IU] via SUBCUTANEOUS
  Filled 2013-12-11 (×14): qty 0.24

## 2013-12-11 MED ORDER — CARVEDILOL 3.125 MG PO TABS
3.1250 mg | ORAL_TABLET | Freq: Two times a day (BID) | ORAL | Status: DC
  Administered 2013-12-11 – 2013-12-24 (×25): 3.125 mg via ORAL
  Filled 2013-12-11 (×28): qty 1

## 2013-12-11 MED ORDER — ALUM & MAG HYDROXIDE-SIMETH 200-200-20 MG/5ML PO SUSP: 30.0000 mL | ORAL | Status: DC | PRN

## 2013-12-11 MED ORDER — INSULIN ASPART 100 UNIT/ML ~~LOC~~ SOLN
0.0000 [IU] | Freq: Every day | SUBCUTANEOUS | Status: DC
  Administered 2013-12-22: 2 [IU] via SUBCUTANEOUS

## 2013-12-11 MED ORDER — ENOXAPARIN SODIUM 40 MG/0.4ML ~~LOC~~ SOLN
40.0000 mg | SUBCUTANEOUS | Status: DC
  Administered 2013-12-11 – 2013-12-23 (×13): 40 mg via SUBCUTANEOUS
  Filled 2013-12-11 (×14): qty 0.4

## 2013-12-11 MED ORDER — CEPHALEXIN 500 MG PO CAPS
500.0000 mg | ORAL_CAPSULE | Freq: Three times a day (TID) | ORAL | Status: DC
  Administered 2013-12-11 – 2013-12-24 (×39): 500 mg via ORAL
  Filled 2013-12-11 (×42): qty 1

## 2013-12-11 MED ORDER — BISACODYL 10 MG RE SUPP: 10.0000 mg | Freq: Every day | RECTAL | Status: DC | PRN

## 2013-12-11 MED ORDER — VITAMIN C 500 MG PO TABS
500.0000 mg | ORAL_TABLET | Freq: Every day | ORAL | Status: DC
  Administered 2013-12-12 – 2013-12-24 (×13): 500 mg via ORAL
  Filled 2013-12-11 (×14): qty 1

## 2013-12-11 MED ORDER — PANTOPRAZOLE SODIUM 40 MG PO TBEC
40.0000 mg | DELAYED_RELEASE_TABLET | Freq: Every day | ORAL | Status: DC
  Administered 2013-12-12 – 2013-12-24 (×13): 40 mg via ORAL
  Filled 2013-12-11 (×15): qty 1

## 2013-12-11 MED ORDER — SENNOSIDES-DOCUSATE SODIUM 8.6-50 MG PO TABS
1.0000 | ORAL_TABLET | Freq: Two times a day (BID) | ORAL | Status: DC
  Administered 2013-12-11 – 2013-12-24 (×26): 1 via ORAL
  Filled 2013-12-11 (×28): qty 1

## 2013-12-11 MED ORDER — LEVETIRACETAM 750 MG PO TABS
1500.0000 mg | ORAL_TABLET | Freq: Two times a day (BID) | ORAL | Status: DC
  Administered 2013-12-11 – 2013-12-24 (×26): 1500 mg via ORAL
  Filled 2013-12-11 (×28): qty 2

## 2013-12-11 MED ORDER — PROCHLORPERAZINE 25 MG RE SUPP
12.5000 mg | Freq: Four times a day (QID) | RECTAL | Status: DC | PRN
  Filled 2013-12-11: qty 1

## 2013-12-11 MED ORDER — DIPHENHYDRAMINE HCL 12.5 MG/5ML PO ELIX: 12.5000 mg | ORAL_SOLUTION | Freq: Four times a day (QID) | ORAL | Status: DC | PRN

## 2013-12-11 MED ORDER — GABAPENTIN 400 MG PO CAPS
400.0000 mg | ORAL_CAPSULE | Freq: Three times a day (TID) | ORAL | Status: DC
  Administered 2013-12-11 – 2013-12-24 (×39): 400 mg via ORAL
  Filled 2013-12-11 (×42): qty 1

## 2013-12-11 MED ORDER — GUAIFENESIN-DM 100-10 MG/5ML PO SYRP: 5.0000 mL | ORAL_SOLUTION | Freq: Four times a day (QID) | ORAL | Status: DC | PRN

## 2013-12-11 MED ORDER — POLYSACCHARIDE IRON COMPLEX 150 MG PO CAPS
150.0000 mg | ORAL_CAPSULE | Freq: Two times a day (BID) | ORAL | Status: DC
  Administered 2013-12-11 – 2013-12-23 (×25): 150 mg via ORAL
  Filled 2013-12-11 (×28): qty 1

## 2013-12-11 MED ORDER — TRAZODONE HCL 50 MG PO TABS
25.0000 mg | ORAL_TABLET | Freq: Every evening | ORAL | Status: DC | PRN
Start: 2013-12-11 — End: 2013-12-24
  Administered 2013-12-11 – 2013-12-23 (×13): 50 mg via ORAL
  Filled 2013-12-11 (×13): qty 1

## 2013-12-11 MED ORDER — ZINC SULFATE 220 (50 ZN) MG PO CAPS
220.0000 mg | ORAL_CAPSULE | Freq: Every day | ORAL | Status: DC
  Administered 2013-12-12 – 2013-12-24 (×13): 220 mg via ORAL
  Filled 2013-12-11 (×14): qty 1

## 2013-12-11 MED ORDER — LISINOPRIL 20 MG PO TABS
20.0000 mg | ORAL_TABLET | Freq: Every day | ORAL | Status: DC
  Administered 2013-12-12 – 2013-12-13 (×2): 20 mg via ORAL
  Filled 2013-12-11 (×4): qty 1

## 2013-12-11 MED ORDER — PROCHLORPERAZINE EDISYLATE 5 MG/ML IJ SOLN
5.0000 mg | Freq: Four times a day (QID) | INTRAMUSCULAR | Status: DC | PRN
  Filled 2013-12-11: qty 2

## 2013-12-11 MED ORDER — PAROXETINE HCL 20 MG PO TABS
20.0000 mg | ORAL_TABLET | Freq: Every day | ORAL | Status: DC
  Administered 2013-12-12 – 2013-12-24 (×13): 20 mg via ORAL
  Filled 2013-12-11 (×14): qty 1

## 2013-12-11 MED ORDER — ASPIRIN EC 325 MG PO TBEC
325.0000 mg | DELAYED_RELEASE_TABLET | Freq: Every day | ORAL | Status: DC
  Administered 2013-12-12 – 2013-12-24 (×13): 325 mg via ORAL
  Filled 2013-12-11 (×14): qty 1

## 2013-12-11 MED ORDER — PROCHLORPERAZINE MALEATE 5 MG PO TABS
5.0000 mg | ORAL_TABLET | Freq: Four times a day (QID) | ORAL | Status: DC | PRN
  Filled 2013-12-11: qty 2

## 2013-12-11 MED ORDER — FLEET ENEMA 7-19 GM/118ML RE ENEM: 1.0000 | ENEMA | Freq: Once | RECTAL | Status: AC | PRN

## 2013-12-11 MED ORDER — ACETAMINOPHEN 325 MG PO TABS: 325.0000 mg | ORAL_TABLET | ORAL | Status: DC | PRN

## 2013-12-11 MED ORDER — ADULT MULTIVITAMIN W/MINERALS CH
1.0000 | ORAL_TABLET | Freq: Every day | ORAL | Status: DC
  Administered 2013-12-12 – 2013-12-24 (×13): 1 via ORAL
  Filled 2013-12-11 (×14): qty 1

## 2013-12-11 NOTE — PMR Pre-admission (Signed)
[facility] PMR Admission Coordinator Pre-Admission Assessment  Patient: Cheyenne Frank is an 73 y.o., female MRN: [id number] DOB: 01/05/1941 Height:  62"  Weight:  225 lbs  Insurance Information HMO:     PPO:      PCP:      IPA:      80/20:      OTHER:  PRIMARY: Medicare A & B      Policy#: [id number]      Subscriber: self CM Name:     Phone#:      Fax#:  Pre-Cert#: verified in Visual merchandiser: retired Benefits:  Phone #:      Name:  Eff. Date: 09-17-06     Deduct: $1260      Out of Pocket Max: none      Life Max: unlimited CIR: 100%      SNF: 100% days 1-20; 80% days 21-100 (100 day max) Note: as of 11-06-13, pt had 34 full inpt days left and was at [facility] on 2-20 through 3-17, then [place] to SNF. Pt will be using her full inpt copay days. 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 She originally had L5-S1 laminectomy with T10-S1 fusion and low and posterior approaches. The pt returned back on 10/16/13 with postoperative infection and she underwent posterior wound exploration and washout on 10/18/2013. 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 There was secondary debridement and closure of  lumbar decompression and fusion with primary closure.  The patient had sepsis, treated with Rocephin and went into respiratory failure for which she was intubated and later extubated without any problems. Her anterior abdominal wound got dehisced and a wound VAC was placed. Wound VAC was later taken off and local treatment advised. Pt also had acute thalmic infarcts. She was extubated on 10/28/13 and transferred to [facility] on BiPAP on 11/05/13.   Pt made good progress while at [facility] with her overall progress and activity tolerance. Pt was considered for inpatient rehab at the time of her discharge from [facility] on 3-17, however there were no available beds at inpatient rehab. Pt then went to skilled nursing facility for approximately one week and then had a fall. Pt was admitted to [facility] and family/pt requesting further inpatient rehab as they are wanting more aggressive therapy.  Work up at [facility] following pt's fall including a negative head CT and neg CT lumbar spine. Pt is making good progress with therapy and is medically cleared for inpatient rehab per Dr. Manuella Ghazi.  Patient's medical record from [facility] has been reviewed by the rehabilitation admission coordinator and physician.  Past Medical History  Past Medical History  Diagnosis Date  . Macular degeneration   . Complication of anesthesia     " feel like I'm dying after I wake up"  . Hypertension   . Dysrhythmia     unknown type   . Asthma     no problems since 1999 (after moving into a new home)  . Stroke 2010    verbal aphasia x 30 min - resolved - no problem since then  . Arthritis   . Fibromyalgia   . Itching   . GERD (gastroesophageal reflux disease)   . Nocturia   . Hx of transfusion 2011  . Diabetes mellitus without complication   . Sleep apnea     Dx 1990's unable to wear c-pap -  . Anxiety   . Diverticulosis   . Flat back syndrome     Family  History  family history is not on file.  Prior Rehab/Hospitalizations: pt has had extensive hospitalizations beginning with original back surgery on 10/06/13, was at [facility] then SNF for one wek.   Current Medications See MAR  Patients Current Diet:  Carb controlled  Precautions / Restrictions Precautions Precautions:  (TLSO for rigorous activity only. OK to perform bed mobility, transfers and gait without brace per neurosx at [facility] per dtr clarifactions)   Prior Activity Level: Pt had been using her rollator for several months due to worsening back pain. She was still driving on limited errands and enjoyed going Paediatric nurse.  Home Assistive Devices / Equipment: Pt has rollator.    Prior Functional Level Current Functional Level  Bed Mobility    Independent Min assist  using rail, reminders for back precautions  Transfers   Independent  Min assist   Mobility - Walk/Wheelchair    Independent though somewhat limited by back pain Min assist (60' interval, fatigued easily and shakey per PT)   Upper Body Dressing   Independent  Mod assist (working around TLSO), min A to don bra, OT noted pt is unsafe due to poor safety awareness with back precautions   Lower Body Dressing   Independent  Mod assist for slacks, min assist for slip on shoes   Grooming   Independent  Other (set up assist at seated level)   Eating/Drinking   Independent  Independent   Toilet Transfer   Independent   minimal assist (had a recent fall in bathroom at [facility])   Bladder Continence     Independent using bedpan or ambulating to restroom with assist   Bowel Management    Independent using bedpan or ambulating to commode with assist   Stair Climbing   Independent   not assessed   Communication    WFL   Some confusion at times per dtrs  Memory    WFL   impaired  Cooking/Meal Prep    Independent     Housework   Independent    Money Management   Independent    Driving    Independent     Special needs/care consideration BiPAP/CPAP - uses CPAP CPM no  Continuous Drip IV no Dialysis no          Life Vest no Oxygen no  Special Bed no  Trach Size no  Wound Vac (area) no(had previous abdominal wound VAC)      Skin - previous ant. Abdominal wound and back wounds, no  current skin issues                              Bowel mgmt: last BM 12-09-13 Bladder mgmt:using bed pan or ambulating to commode with assist (had fall in restroom at SNF) Diabetic mgmt -yes, managed with meds  Previous Home Environment Living Arrangements: Children  Lives With: Daughter (and other dtr lives 5 minutes away) Available Help at Discharge: Family;Available 24 hours/day (dtr [name] is on disability for [place] ) Type of Home: House Home Layout: Two level;Able to live on main level with bedroom/bathroom Alternate Level Stairs-Rails:  (does not need to go upstairs) Home Access: Stairs to enter Entrance Stairs-Rails: Right Entrance Stairs-Number of Steps: 5  Discharge Living Setting Plans for Discharge Living Setting: House;Lives with (comment) (lives with dtr [name]) Type of Home at Discharge: House Discharge Home Layout: Two level;Able to live on main level with bedroom/bathroom Alternate Level Stairs-Rails:  (does not go upstairs) Discharge Home Access: Stairs to enter Entrance Stairs-Rails: Right Entrance Stairs-Number of Steps: 5 Does the patient have any problems obtaining your medications?: No  Social/Family/Support Systems Contact Information: dtrs [name] and [name] Caren Griffins) Anticipated Caregiver: dtr [name] who pt lives with. Anticipated Caregiver's Contact Information: see above Ability/Limitations of Caregiver: dtr [name]) is able to provide only minimal physical assistance as she herself is on disability from [place] and recently hurt her back in the snow Caregiver Availability: 24/7 Discharge Plan Discussed with Primary Caregiver: Yes (discussed with pt and her two dtrs on  11-30-13) Is Caregiver In Agreement with Plan?: Yes Does Caregiver/Family have Issues with Lodging/Transportation while Pt is in Rehab?: No  Goals/Additional Needs Patient/Family Goal for Rehab: Mod I w/ PT, OT and SLP     Expected length of stay: 10-14 days Cultural Considerations: none Equipment Needs: to be determined Pt/Family Agrees to Admission and willing to participate: Yes Program Orientation Provided & Reviewed with Pt/Caregiver Including Roles  & Responsibilities: Yes  Patient Condition: I met with pt and her daughters on 11-30-13 while at [facility] and explained the purpose of acute inpatient rehabilitation. At the time of her discharge from [facility] on 3-17, pt was a good candidate for inpatient rehab but no bed available. Pt then went to SNF for approximately one week and came to [facility] following a fall. Pt/family pursued inpatient rehab consult and I met with them at [facility] on 12-10-13. Prior to her involved back surgery and multiple medical complications including thalmic CVA, pt was independent and driving on limited errands. Pt is currently requiring minimal assistance with ambulation and demonstrates poor activity tolerance and decreased safety awareness. She is needing minimal to moderate assistance with her self care skills and has limitations due to her back precautions and TLSO. Pt will greatly benefit from the multi-disciplinary team of skilled PT, OT, SLP and rehab nursing to focus on maximizing her independence from home following her complicated medical course and thalmic CVA. PT, OT and rehab nursing to focus on increasing strength for greater independence in transfers, bed mobility and ambulation to prepare for home. In addition, she will benefit from rehab physician guidance to follow her complicated medical course of events since the original back surgery. Discussed case with rehab team and Dr. Naaman Plummer who stated pt is a good candidate for inpatient rehab. Pt and  family are motivated to come and and she will benefit from the intensive services of skilled therapy under rehab physician guidance. Pt will  be admitted today on 12-11-13.  Preadmission Screen Completed By:  Nanetta Batty, PT 12/11/2013 11:22 AM ______________________________________________________________________   Discussed status with Dr. Naaman Plummer on 12-11-13 at 1136 and received telephone approval for admission today.  Admission Coordinator:  Turnington,Janine L, time 1136/Date 12-11-13   Assessment/Plan: Diagnosis: Thalamic infarct after Lumbar lami---also SDH requiring burr hole 1. Does the need for close, 24 hr/day  Medical supervision in concert with the patient's rehab needs make it unreasonable for this patient to be served in a less intensive setting? Yes 2. Co-Morbidities requiring supervision/potential complications: FMS, anemia, respiratory failure, sepsis 3. Due to bladder management, bowel management, safety, skin/wound care, disease management, medication administration, pain management and patient education, does the patient require 24 hr/day rehab nursing? Yes 4. Does the patient require coordinated care of a physician, rehab nurse, PT (1-2 hrs/day, 5 days/week), OT (1-2 hrs/day, 5 days/week) and SLP (1-2 hrs/day, 5 days/week) to address physical and functional deficits in the context of the above medical diagnosis(es)? Yes Addressing deficits in the following areas: balance, endurance, locomotion, strength, transferring, bowel/bladder control, bathing, dressing, feeding, grooming, toileting, cognition, speech, language, swallowing and psychosocial support 5. Can the patient actively participate in an intensive therapy program of at least 3 hrs of therapy 5 days a week? Yes 6. The potential for patient to make measurable gains while on inpatient rehab is excellent and good 7. Anticipated functional outcomes upon discharge from inpatients are: modified independent PT, modified  independent OT, modified independent SLP 8. Estimated rehab length of stay to reach the above functional goals is: 12 -14 days 9. Does the patient have adequate social supports to accommodate these discharge functional goals? Yes 10. Anticipated D/C setting: Home 11. Anticipated post D/C treatments: HH therapy and Outpatient therapy 12. Overall Rehab/Functional Prognosis: excellent    RECOMMENDATIONS: This patient's condition is appropriate for continued rehabilitative care in the following setting: CIR Patient has agreed to participate in recommended program. Yes Note that insurance prior authorization may be required for reimbursement for recommended care.  Comment: Pt to be admitted today.  Meredith Staggers, MD, [facility]   Turnington,Janine L 12/11/2013

## 2013-12-11 NOTE — H&P (Signed)
Physical Medicine and Rehabilitation Admission H&P     CC: Right sided weakness with cognitive deficits.   HPI:  Ms Berghuis is a 73 year old female with history of OSA, HTN, and chronic back pain with multiple surgeries.  She underwent L5-S1 decompression with T10-S1 fusion via anterior and posterior approach on 10/06/13 at [facility]. Hosiptal course complicated by E coli bacteremia with wound infection requiring I and D on 10/16/13.  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 Patient developed acute thalamic infarcts with resultant right sided weakness with left hearing loss. She was admitted to [facility] on 11/05/13 for IV antibiotics, management of abdominal wound VAC as well as progressive  therapy thorough 12/08/13.  Pt was considered for inpatient rehab at the time of her discharge from [facility] on 3-17, however there were no available beds at inpatient rehab.  She was at [facility] for about a week and on 12/09/13, she attempted to ambulate without assistance and sustained a fall due to dizziness. She was admitted to [facility]  for work up and was found to be hypotensive. She was noted to be dehydrated with BUN- 43 and was treated with IVF with improvement.   CT head with old left frontal burr hole--stable and no skull fracture.  CT lumbar spine done today showing extensive post surgical changes with anterior fusion L4-L5 and L5-S1 without bridging bone across disc space but solid fusion L1-L4 and CT thoracic spine with posterior rod T10-T12 with female position of Left-T10 screw terminating in the paraspinal soft tissues. She has  made good progress while at [facility] medically  and with activity tolerance.  Work up completed at [employer] and Patient/ family requesting further inpatient rehab as they are wanting more aggressive therapy. Pt was admitted today.   Review of Systems  HENT: Negative for hearing loss.   Eyes: Positive for blurred vision (left eye due to  macular degeneration).  Respiratory: Negative for cough and shortness of breath.   Cardiovascular: Negative for chest pain and palpitations.  Gastrointestinal: Negative for heartburn, nausea, abdominal pain and constipation.  Genitourinary: Positive for urgency and frequency.  Musculoskeletal: Positive for myalgias.  Neurological: Positive for dizziness and focal weakness. Negative for headaches.  Psychiatric/Behavioral: The patient is nervous/anxious (especially at nights per family. ). The patient does not have insomnia.        Past Medical History   Diagnosis  Date   .  Macular degeneration     .  Complication of anesthesia         " feel like I'm dying after I wake up"   .  Hypertension     .  Dysrhythmia         unknown type    .  Asthma         no problems since 1999 (after moving into a new home)   .  Stroke  2010       verbal aphasia x 30 min - resolved - no problem since then   .  Arthritis     .  Itching     .  GERD (gastroesophageal reflux disease)     .  Nocturia     .  Hx of transfusion  2011   .  Diabetes mellitus without complication     .  Sleep apnea         Dx 1990's unable to wear c-pap -   .  Anxiety     .  Diverticulosis     .  Flat back syndrome     .  Restless leg syndrome     .  Anemia of chronic disease     .  Fibromyalgia     .  Chronic airway obstruction         Past Surgical History   Procedure  Laterality  Date   .  Tonsillectomy       .  Dilation and curettage of uterus       .  Abdominal hysterectomy           rt so   .  Hernia repair       .  Breast reduction surgery       .  Cholecystectomy    1990   .  Cataracts removed       .  Back surgery    2010 / 2011   .  Total knee arthroplasty  Left  04/06/2013       Procedure: LEFT TOTAL KNEE ARTHROPLASTY;  Surgeon: Loanne Drilling, MD;  Location: [facility];  Service: Orthopedics;  Laterality: Left;   .  Burr hole for subdural hematoma    2015   .  Posterior laminectomy / decompression  lumbar spine           with T10-S1 fusion     Family History  Problem Relation Age of Onset  . COPD Sister     had lung transplant  . COPD Brother    Social History:  Lives alone and independent with PTA. Used RW due to pain. Retired Engineer, civil (consulting). she  reports that she has never smoked. She does not have any smokeless tobacco history on file. She reports that she does not drink alcohol or use illicit drugs.  Allergies   Allergen  Reactions   .  Demerol [Meperidine]         Agitation   .  Dilaudid [Hydromorphone]         Paranoia   .  Lipitor [Atorvastatin]     .  Nifedipine     .  Pravastatin     .  Sulfa Antibiotics  Rash     (Not in a hospital admission)  Home: Lives with daughter (who has MS) in two level home with 5 STE.  Stays on first level.     Functional History:   Functional Status:   Mobility: Min assist for transfers Min assist for ambulating 60 feet with rest breaks.   ADL: Mod assist upper body dressing due to TLSO--poor awareness of back precautions Mod to max assist for lower body dressing.   Cognition:    Physical Exam  Nursing note and vitals reviewed. Constitutional: She is oriented to person, place, and time. She appears well-developed and well-nourished.  HENT:  Head: Normocephalic and atraumatic.  Right Ear: External ear normal.  Left Ear: External ear normal.  Eyes: Conjunctivae and EOM are normal. Pupils are equal, round, and reactive to light.  Neck: Normal range of motion. Neck supple. No JVD present. No tracheal deviation present. No thyromegaly present.  Cardiovascular: Normal rate and regular rhythm.  Exam reveals no gallop and no friction rub.   No murmur heard. Pulmonary/Chest: Effort normal and breath sounds normal. No respiratory distress. She has no wheezes. She has no rales. She exhibits no tenderness.  Abdominal: Soft. Bowel sounds are normal. She exhibits no distension. There is no tenderness. There is no rebound.  Midline  incision with 3 cm  superficially dehisced area. Long thoracolumbar incision healing well with minimal drainage on dressing (proximal aspect).   Musculoskeletal: She exhibits no edema and no tenderness.  Lymphadenopathy:    She has no cervical adenopathy.  Neurological: She is alert and oriented to person, place, and time.  Flat affect with delayed responses. Able to ID simple objects with minimal delay. Has more difficulty with sentences and phrases but communicates fairly well with extra time. Speech clear. Able to follow basic commands without difficulty. Mild memory deficits. Right sided weakness with decrease in motor control.  RUE 4/5 with decreased FMC, LUE 4+. RLE 3/5 HF to 4/5 ADF/APF. LLE 4- HF to 4+/5 distally. No gross sensory deficits. CN exam unremarkable.   Skin: Skin is warm and dry.  Stage 2 wound in hypogastric region with 1cm x 6cm area of pink granulation. Small, 1cm area of granulation around incision in back. Scar on left scalp well healed.  Psychiatric: Her behavior is normal.    Anemia Panel: Iron-37   TIBC- 341    Iron saturation: 11      Ferritin: 105  Na: 138   K: 4.4    Cl: 102   CO2: 28    BUN: 43    Cr: 1.2    Mg: 1.9   WBC:  2.6     Hgb: 8.0     Hct: 25.3     Plt:  285  Post Admission Physician Evaluation: 1. Functional deficits secondary  to left thalamic infarct and left SDH after lumbar spine decompression and fusion. 2. Patient is admitted to receive collaborative, interdisciplinary care between the physiatrist, rehab nursing staff, and therapy team. 3. Patient's level of medical complexity and substantial therapy needs in context of that medical necessity cannot be provided at a lesser intensity of care such as a SNF. 4. Patient has experienced substantial functional loss from his/her baseline which was documented above under the "Functional History" and "Functional Status" headings.  Judging by the patient's diagnosis, physical exam, and functional history, the  patient has potential for functional progress which will result in measurable gains while on inpatient rehab.  These gains will be of substantial and practical use upon discharge  in facilitating mobility and self-care at the household level. 5. Physiatrist will provide 24 hour management of medical needs as well as oversight of the therapy plan/treatment and provide guidance as appropriate regarding the interaction of the two. 6. 24 hour rehab nursing will assist with bladder management, bowel management, safety, skin/wound care, disease management, medication administration, pain management and patient education  and help integrate therapy concepts, techniques,education, etc. 7. PT will assess and treat for/with: Lower extremity strength, range of motion, stamina, balance, functional mobility, safety, adaptive techniques and equipment, NMR, pain mgt, ego support, education for family, back precautions.   Goals are: mod I. 8. OT will assess and treat for/with: ADL's, functional mobility, safety, upper extremity strength, adaptive techniques and equipment, NMR, pain mgt, back precautions, education.   Goals are: mod I. 9. SLP will assess and treat for/with: communication, language, cognition.  Goals are: mod I to supervision. 10. Case Management and Social Worker will assess and treat for psychological issues and discharge planning. 11. Team conference will be held weekly to assess progress toward goals and to determine barriers to discharge. 12. Patient will receive at least 3 hours of therapy per day at least 5 days per week. 13. ELOS: 10 days        14. Prognosis: excellent  Medical Problem List and Plan: Thalamic infarct after Lumbar lami---also SDH requiring burr hole  1. DVT Prophylaxis/Anticoagulation: SQ Lovenox.  2. Pain Management: prn tylenol for now.  3. Mood: Seems blunted. Provide ego support. LCSW to follow for evaluation and support.  4. Neuropsych: This patient is capable of  making decisions on her own behalf. 5. Lumbar laminectomy with post op I and D for infection: To continues on keflex for a year per NS at [facility].   6. SDH s/p evacuation: On keppra bid for seizures/seizure prophylaxis. 7. DM type 2:  Will monitor with ac/hs checks. Continue levemir at bedtime with SSI for elevated BS. Titrate medications as needed--poor po intake reported.  8. OSA with hypercarbia: To continue CPAP. 9.  Neutropenia: Question etiology. Recheck Monday. 10 Anemia of chronic disease/iron deficiency: Add iron supplement.     Ranelle Oyster, MD, [facility] Physical Medicine & Rehabilitation  12/11/2013

## 2013-12-11 NOTE — Progress Notes (Signed)
Pt. Was placed on CPAP auto titrate (min: 4, max: 16) via nasal mask with 1L O2 bled in. Pt. Is tolerating CPAP well at this time without any complications.

## 2013-12-12 ENCOUNTER — Inpatient Hospital Stay (HOSPITAL_COMMUNITY): Payer: Medicare Other | Admitting: Physical Therapy

## 2013-12-12 ENCOUNTER — Inpatient Hospital Stay (HOSPITAL_COMMUNITY): Payer: Medicare Other | Admitting: *Deleted

## 2013-12-12 ENCOUNTER — Inpatient Hospital Stay (HOSPITAL_COMMUNITY): Payer: Medicare Other | Admitting: Speech Pathology

## 2013-12-12 DIAGNOSIS — I10 Essential (primary) hypertension: Secondary | ICD-10-CM

## 2013-12-12 DIAGNOSIS — I6350 Cerebral infarction due to unspecified occlusion or stenosis of unspecified cerebral artery: Secondary | ICD-10-CM

## 2013-12-12 DIAGNOSIS — E119 Type 2 diabetes mellitus without complications: Secondary | ICD-10-CM

## 2013-12-12 DIAGNOSIS — I6200 Nontraumatic subdural hemorrhage, unspecified: Secondary | ICD-10-CM

## 2013-12-12 DIAGNOSIS — D62 Acute posthemorrhagic anemia: Secondary | ICD-10-CM

## 2013-12-12 LAB — CBC WITH DIFFERENTIAL/PLATELET
BASOS ABS: 0 10*3/uL (ref 0.0–0.1)
BASOS PCT: 1 % (ref 0–1)
EOS ABS: 0.4 10*3/uL (ref 0.0–0.7)
Eosinophils Relative: 15 % — ABNORMAL HIGH (ref 0–5)
HEMATOCRIT: 28.8 % — AB (ref 36.0–46.0)
Hemoglobin: 8.8 g/dL — ABNORMAL LOW (ref 12.0–15.0)
LYMPHS ABS: 1.4 10*3/uL (ref 0.7–4.0)
LYMPHS PCT: 55 % — AB (ref 12–46)
MCH: 26.2 pg (ref 26.0–34.0)
MCHC: 30.6 g/dL (ref 30.0–36.0)
MCV: 85.7 fL (ref 78.0–100.0)
Monocytes Absolute: 0.7 10*3/uL (ref 0.1–1.0)
Monocytes Relative: 28 % — ABNORMAL HIGH (ref 3–12)
NEUTROS ABS: 0 10*3/uL — AB (ref 1.7–7.7)
Neutrophils Relative %: 1 % — ABNORMAL LOW (ref 43–77)
Platelets: 300 10*3/uL (ref 150–400)
RBC: 3.36 MIL/uL — ABNORMAL LOW (ref 3.87–5.11)
RDW: 16.5 % — ABNORMAL HIGH (ref 11.5–15.5)
Smear Review: ADEQUATE
WBC: 2.5 10*3/uL — ABNORMAL LOW (ref 4.0–10.5)

## 2013-12-12 LAB — GLUCOSE, CAPILLARY
GLUCOSE-CAPILLARY: 153 mg/dL — AB (ref 70–99)
Glucose-Capillary: 107 mg/dL — ABNORMAL HIGH (ref 70–99)
Glucose-Capillary: 138 mg/dL — ABNORMAL HIGH (ref 70–99)
Glucose-Capillary: 191 mg/dL — ABNORMAL HIGH (ref 70–99)

## 2013-12-12 NOTE — Evaluation (Signed)
Physical Therapy Assessment and Plan  Patient Details  Name: Cheyenne Frank MRN: [id number] Date of Birth: Apr 30, 1941  PT Diagnosis: Abnormality of gait, Difficulty walking, Muscle weakness and Pain in Lower Back Rehab Potential: Good ELOS: 10-14 days  Today's Date: 12/12/2013 Time: 1030-1130 Time Calculation (min): 60 min  Problem List:  Patient Active Problem List   Diagnosis Date Noted  . SDH (subdural hematoma) 12/11/2013  . Postoperative wound infection--lumbar spine/2015 12/11/2013  . CVA (cerebral infarction) 12/11/2013  . Postoperative anemia due to acute blood loss 04/09/2013  . Diabetes 04/07/2013  . Hypertension 04/07/2013  . Unspecified sleep apnea 04/07/2013  . OA (osteoarthritis) of knee 04/06/2013    Past Medical History:  Past Medical History  Diagnosis Date  . Macular degeneration   . Complication of anesthesia     " feel like I'm dying after I wake up"  . Hypertension   . Dysrhythmia     unknown type   . Asthma     no problems since 1999 (after moving into a new home)  . Stroke 2010    verbal aphasia x 30 min - resolved - no problem since then  . Arthritis   . Itching   . GERD (gastroesophageal reflux disease)   . Nocturia   . Hx of transfusion 2011  . Diabetes mellitus without complication   . Sleep apnea     Dx 1990's unable to wear c-pap -  . Anxiety   . Diverticulosis   . Flat back syndrome   . Restless leg syndrome   . Anemia of chronic disease   . Fibromyalgia   . Chronic airway obstruction    Past Surgical History:  Past Surgical History  Procedure Laterality Date  . Tonsillectomy    . Dilation and curettage of uterus    . Abdominal hysterectomy      rt so  . Hernia repair    . Breast reduction surgery    . Cholecystectomy  1990  . Cataracts removed    . Back surgery  2010 / 2011  . Total knee arthroplasty Left 04/06/2013    Procedure: LEFT TOTAL KNEE ARTHROPLASTY;  Surgeon: Gearlean Alf, MD;  Location: [facility];  Service:  Orthopedics;  Laterality: Left;  . Burr hole for subdural hematoma  2015  . Posterior laminectomy / decompression lumbar spine      with T10-S1 fusion    Assessment & Plan Clinical Impression: Cheyenne Frank is a 73 year old female with history of OSA, HTN, and chronic back pain with multiple surgeries. She underwent L5-S1 decompression with T10-S1 fusion via anterior and posterior approach on 10/06/13 at [facility]. Hosiptal course complicated by E coli bacteremia with wound infection requiring I and D on 10/16/13. 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 Patient developed acute thalamic infarcts with resultant right sided weakness with left hearing loss. She was admitted to [facility] on 11/05/13 for IV antibiotics, management of abdominal wound VAC as well as progressive therapy thorough 12/08/13. Pt was considered for inpatient rehab at the time of her discharge from [facility] on 3-17, however there were no available beds at inpatient rehab. She was at [facility] for about a week and on 12/09/13, she attempted to ambulate without assistance and sustained a fall due to dizziness. She was admitted to [place] for work up and was found to be hypotensive. She was noted to be dehydrated with BUN- 43 and was treated with IVF with improvement.  CT  head with old left frontal burr hole--stable and no skull fracture. CT lumbar spine done today showing extensive post surgical changes with anterior fusion L4-L5 and L5-S1 without bridging bone across disc space but solid fusion L1-L4 and CT thoracic spine with posterior rod T10-T12 with female position of Left-T10 screw terminating in the paraspinal soft tissues. She has made good progress while at [facility] medically and with activity tolerance. Work up completed at [employer] and Patient/ family requesting further inpatient rehab as they are wanting more aggressive therapy.  Patient transferred to [facility] on 12/11/2013 .   Patient currently requires  min with mobility secondary to muscle weakness and endurance and unbalanced muscle activation.  Prior to hospitalization, patient was modified independent  with mobility and lived with Daughter (cindy (adult daughter with Cheyenne) her husband Tommie Raymond and 9yo daughter Debe Coder.) in a House home.  Home access is 4 to enter through garageStairs to enter.  Patient will benefit from skilled PT intervention to maximize safe functional mobility, minimize fall risk and decrease caregiver burden for planned discharge home with 24 hour supervision.  Anticipate patient will benefit from follow up [place] at discharge.  PT - End of Session Endurance Deficit: Yes  PT Evaluation Precautions/Restrictions Precautions Precautions: Fall;Back Precaution Comments: Back/Spinal precautions Required Braces or Orthoses: Spinal Brace Spinal Brace: Thoracolumbosacral orthotic;Applied in sitting position Restrictions Weight Bearing Restrictions: No General Chart Reviewed: Yes Family/Caregiver Present: No  Pain Pain Assessment Pain Assessment: 0-10 Pain Score: 4  Pain Type: Surgical pain Pain Location: Back Pain Orientation: Right;Left Pain Descriptors / Indicators: Aching Pain Onset: On-going Patients Stated Pain Goal: 2 Pain Intervention(s): Medication (See eMAR);Repositioned Multiple Pain Sites: No Home Living/Prior Functioning Home Living Available Help at Discharge: Family Type of Home: House Home Access: Stairs to enter [employer] of Steps: 4 to enter through garage Entrance Stairs-Rails: Right Home Layout: Two level;Able to live on main level with bedroom/bathroom  Lives With: Daughter (cindy (adult daughter with Cheyenne) her husband Tommie Raymond and 9yo daughter Debe Coder.) Prior Function Level of Independence: Independent with basic ADLs;Requires assistive device for independence  Able to Take Stairs?: Yes Driving: Yes Vocation: Retired 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 Vision/Perception  Vision - History Baseline Vision: Wears glasses only for reading Patient Visual Report: Diplopia Vision - Assessment Eye Alignment: Within Functional Limits Vision Assessment: Vision tested Ocular Range of Motion: Restricted on the right;Restricted looking up;Restricted looking down;Impaired-to be further tested in functional context Additional Comments: right inattention Perception Perception: Impaired Inattention/Neglect: Does not attend to right visual field Praxis Praxis: Intact  Cognition Overall Cognitive Status: Impaired/Different from baseline Arousal/Alertness: Awake/alert Orientation Level: Oriented X4 Attention: Sustained Sustained Attention: Appears intact Memory: Appears intact Awareness: Appears intact Problem Solving: Impaired Problem Solving Impairment: Verbal complex;Functional complex Executive Function: Writer: Appears intact Safety/Judgment: Appears intact Sensation Sensation Light Touch: Appears Intact (tingling in toes secondary to DM prior to hospitalization ) Stereognosis: Appears Intact Hot/Cold: Appears Intact Proprioception: Appears Intact Coordination Gross Motor Movements are Fluid and Coordinated: Yes Fine Motor Movements are Fluid and Coordinated: No (decreased handwriting) Motor  Motor Motor: Hemiplegia Motor - Skilled Clinical Observations: head tremor-- premorbid  Mobility Transfers Transfers: Yes Sit to Stand: 4: Min assist Sit to Stand Details: Tactile cues for sequencing;Visual cues/gestures for precautions/safety;Verbal cues for precautions/safety Stand to Sit: 4: Min assist Stand to Sit Details (indicate cue type and reason): Visual cues/gestures for precautions/safety Stand Pivot Transfers: 4: Min assist Stand Pivot Transfer Details: Verbal cues for  precautions/safety;Verbal cues for technique Trunk/Postural Assessment  Cervical Assessment Cervical Assessment: Within Functional  Limits Thoracic Assessment Thoracic Assessment: Within Functional Limits Lumbar Assessment Lumbar Assessment: Within Functional Limits Postural Control Postural Control: Deficits on evaluation Postural Limitations: back precautions  Balance Balance Balance Assessed: Yes Standardized Balance Assessment Standardized Balance Assessment:  (functional activities) Static Sitting Balance Static Sitting - Balance Support: No upper extremity supported;Feet supported Static Sitting - Level of Assistance: 6: Modified independent (Device/Increase time) Dynamic Sitting Balance Dynamic Sitting - Balance Support: Feet supported;Right upper extremity supported Dynamic Sitting - Level of Assistance: 4: Min assist Dynamic Sitting - Balance Activities: Trunk control activities;Reaching for objects Static Standing Balance Static Standing - Level of Assistance: 4: Min assist Dynamic Standing Balance Dynamic Standing - Balance Support: During functional activity Dynamic Standing - Level of Assistance: 4: Min assist (ambulated with RW to bathroom) Extremity Assessment  RUE Assessment RUE Assessment: Exceptions to [facility] RUE Strength RUE Overall Strength: Deficits (4/5) LUE Assessment LUE Assessment: Within Functional Limits LLE WFL  RLE WFL  FIM:  FIM - Control and instrumentation engineer Devices: Copy: 4: Supine > Sit: Min A (steadying Pt. > 75%/lift 1 leg);4: Sit > Supine: Min A (steadying pt. > 75%/lift 1 leg);4: Bed > Chair or W/C: Min A (steadying Pt. > 75%);4: Chair or W/C > Bed: Min A (steadying Pt. > 75%) FIM - Locomotion: Wheelchair Locomotion: Wheelchair: 0: Activity did not occur FIM - Locomotion: Ambulation Locomotion: Ambulation Assistive Devices: Administrator Ambulation/Gait Assistance: 4: Min assist Locomotion: Ambulation: 2: Travels 50 - 149 ft with minimal assistance (Pt.>75%) FIM - Locomotion: Stairs Locomotion: Stairs: 0: Activity did not  occur   Refer to Care Plan for Long Term Goals  Recommendations for other services: None  Discharge Criteria: Patient will be discharged from PT if patient refuses treatment 3 consecutive times without medical reason, if treatment goals not met, if there is a change in medical status, if patient makes no progress towards goals or if patient is discharged from hospital.  The above assessment, treatment plan, treatment alternatives and goals were discussed and mutually agreed upon: by patient  Clearence Ped 12/12/2013, 5:15 PM

## 2013-12-12 NOTE — Progress Notes (Signed)
Physical Therapy Session Note  Patient Details  Name: Cheyenne Frank MRN: [id number] Date of Birth: 02/23/1941  Today's Date: 12/12/2013 Time: 1300-1330 Time Calculation (min): 30 min  Short Term Goals: Week 1:  PT Short Term Goal 1 (Week 1): Patient will be able to perform bed mobility with S/Mod-Independent assist. PT Short Term Goal 2 (Week 1): Patient will be able to perform transfers with S/Mod-Independent assist. PT Short Term Goal 3 (Week 1): Patient will be able to ambulate using LRAD x 180' with S/Mod-Independent assist.  Therapy Documentation Precautions:  Precautions Precautions: Fall;Back Precaution Comments: Back/Spinal precautions Required Braces or Orthoses: Spinal Brace Spinal Brace: Thoracolumbosacral orthotic;Applied in sitting position Restrictions Weight Bearing Restrictions: No Vital Signs: Therapy Vitals Temp: 98.2 F (36.8 C) Temp src: Oral Pulse Rate: 88 Resp: 20 BP: 104/50 mmHg Patient Position, if appropriate: Lying Oxygen Therapy SpO2: 97 % O2 Device: None (Room air) Pain: Pain Assessment Pain Assessment: No/denies pain at rest  Therapeutic Activity:(30') Bed mobility min-assist with logroll right side and to sitting with min-assist. Transfers bed<->chair with min-assist using RW for stability. Monitored BP/HR and O2 sats with vitals dropping in standing (BP 98/58, HR 100, O2 sats 100%) with c/o light headedness. Patient returned to recliner chair and vitals and symptoms stabilized. Continue to monitor and encourage fluids and  Endurance exercises.   See FIM for current functional status  Therapy/Group: Individual Therapy  Rex KrasLUNDGREN, Dakari Stabler J 12/12/2013, 5:28 PM

## 2013-12-12 NOTE — Evaluation (Signed)
Speech Language Pathology Assessment and Plan  Patient Details  Name: Caidynce Muzyka MRN: [id number] Date of Birth: 11/23/1940  SLP Diagnosis: Dysarthria;Cognitive Impairments;Speech and Language deficits  Rehab Potential: Excellent ELOS: 10 days   Today's Date: 12/12/2013 Time: 4481-8563 Time Calculation (min): 60 min  Problem List:  Patient Active Problem List   Diagnosis Date Noted  . SDH (subdural hematoma) 12/11/2013  . Postoperative wound infection--lumbar spine/2015 12/11/2013  . CVA (cerebral infarction) 12/11/2013  . Postoperative anemia due to acute blood loss 04/09/2013  . Diabetes 04/07/2013  . Hypertension 04/07/2013  . Unspecified sleep apnea 04/07/2013  . OA (osteoarthritis) of knee 04/06/2013   Past Medical History:  Past Medical History  Diagnosis Date  . Macular degeneration   . Complication of anesthesia     " feel like I'm dying after I wake up"  . Hypertension   . Dysrhythmia     unknown type   . Asthma     no problems since 1999 (after moving into a new home)  . Stroke 2010    verbal aphasia x 30 min - resolved - no problem since then  . Arthritis   . Itching   . GERD (gastroesophageal reflux disease)   . Nocturia   . Hx of transfusion 2011  . Diabetes mellitus without complication   . Sleep apnea     Dx 1990's unable to wear c-pap -  . Anxiety   . Diverticulosis   . Flat back syndrome   . Restless leg syndrome   . Anemia of chronic disease   . Fibromyalgia   . Chronic airway obstruction    Past Surgical History:  Past Surgical History  Procedure Laterality Date  . Tonsillectomy    . Dilation and curettage of uterus    . Abdominal hysterectomy      rt so  . Hernia repair    . Breast reduction surgery    . Cholecystectomy  1990  . Cataracts removed    . Back surgery  2010 / 2011  . Total knee arthroplasty Left 04/06/2013    Procedure: LEFT TOTAL KNEE ARTHROPLASTY;  Surgeon: Gearlean Alf, MD;  Location: [facility];  Service:  Orthopedics;  Laterality: Left;  . Burr hole for subdural hematoma  2015  . Posterior laminectomy / decompression lumbar spine      with T10-S1 fusion    Assessment / Plan / Recommendation Clinical Impression The patient is a 73- year old female with history of OSA, HTN, and chronic back pain with multiple surgeries. She underwent L5-S1 decompression with T10-S1 fusion via anterior and posterior approach on 10/06/13 at [facility]. Hospital course complicated by E coli bacteremia with wound infection requiring I and D on 10/16/13. 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 Patient developed acute thalamic infarcts with resultant right sided weakness with left hearing loss. She was admitted to [facility] on 11/05/13 for IV antibiotics, management of abdominal wound VAC as well as progressive therapy thorough 12/08/13. Pt was considered for inpatient rehab at the time of her discharge from [facility] on 3-17, however there were no available beds at inpatient rehab. She was at [facility] for about a week and on 12/09/13, she attempted to ambulate without assistance and sustained a fall due to dizziness. She was admitted to [facility] for work up and was found to be hypotensive. She was noted to be dehydrated with BUN- 43 and was treated with IVF with improvement.   Skilled Therapeutic Interventions  Evaluation of cognition, linguistic, and swallowing function.   SLP Assessment  Patient will need skilled Speech Lanaguage Pathology Services during CIR admission    Recommendations  Diet Recommendations: Regular;Thin liquid Liquid Administration via: Straw;Cup Medication Administration: Whole meds with liquid Supervision: Patient able to self feed Postural Changes and/or Swallow Maneuvers: Seated upright 90 degrees Oral Care Recommendations: Oral care BID Patient destination: Home Follow up Recommendations: None Equipment Recommended: None recommended by SLP    SLP Frequency 5 out of  7 days   SLP Treatment/Interventions Cognitive remediation/compensation;Speech/Language facilitation;Therapeutic Activities;Patient/family education;Oral motor exercises    Pain Pain Assessment Pain Assessment: No/denies pain Prior Functioning    Short Term Goals: Week 1: SLP Short Term Goal 1 (Week 1): Patient will participate in oral motor exercises to increase lingual strength/sensation, with min A multimodal cueing.  SLP Short Term Goal 2 (Week 1): Patient will use compensatory strategies to increase intelligible speech with min A multimodal cueing.  SLP Short Term Goal 3 (Week 1): Patient will utilize strategies to increase word finding ability with min A multimodal cueing.  SLP Short Term Goal 4 (Week 1): Patient will demonstrate complex problem solving ability with min A multimodal cueing.   See FIM for current functional status Refer to Care Plan for Long Term Goals  Recommendations for other services: None  Discharge Criteria: Patient will be discharged from SLP if patient refuses treatment 3 consecutive times without medical reason, if treatment goals not met, if there is a change in medical status, if patient makes no progress towards goals or if patient is discharged from hospital.  The above assessment, treatment plan, treatment alternatives and goals were discussed and mutually agreed upon: by patient  Frances Maywood 12/12/2013, 5:49 PM

## 2013-12-12 NOTE — Progress Notes (Signed)
Cheyenne Frank is a 73 y.o. female 11/10/1940 [id number]  Subjective: No new complaints. No new problems. Slept well. Feeling OK.  Objective: Vital signs in last 24 hours: Temp:  [97.5 F (36.4 C)] 97.5 F (36.4 C) (03/28 0507) Pulse Rate:  [70-76] 76 (03/28 0507) Resp:  [16-19] 19 (03/28 0507) BP: (117-123)/(54-74) 123/54 mmHg (03/28 0507) SpO2:  [96 %-97 %] 96 % (03/28 0507) Weight change:     Intake/Output from previous day: 03/27 0701 - 03/28 0700 In: 120 [P.O.:120] Out: 1 [Urine:1] Last cbgs: CBG (last 3)   Recent Labs  12/11/13 1648 12/11/13 2043 12/12/13 0734  GLUCAP 158* 112* 107*     Physical Exam General: No apparent distress   HEENT: not dry Lungs: Normal effort. Lungs clear to auscultation, no crackles or wheezes. Cardiovascular: Regular rate and rhythm, no edema Abdomen: S/NT/ND; BS(+) Musculoskeletal:  unchanged Neurological: No new neurological deficits Wounds: N/A    Skin: clear  Aging changes Mental state: Alert, oriented, cooperative    Lab Results: BMET    Component Value Date/Time   NA 141 11/29/2013 0538   K 4.7 11/29/2013 0538   CL 100 11/29/2013 0538   CO2 25 11/29/2013 0538   GLUCOSE 117* 11/29/2013 0538   BUN 26* 11/29/2013 0538   CREATININE 0.68 11/29/2013 0538   CALCIUM 9.6 11/29/2013 0538   GFRNONAA 85* 11/29/2013 0538   GFRAA >90 11/29/2013 0538   CBC    Component Value Date/Time   WBC 2.5* 12/12/2013 0536   RBC 3.36* 12/12/2013 0536   HGB 8.8* 12/12/2013 0536   HCT 28.8* 12/12/2013 0536   PLT 300 12/12/2013 0536   MCV 85.7 12/12/2013 0536   MCH 26.2 12/12/2013 0536   MCHC 30.6 12/12/2013 0536   RDW 16.5* 12/12/2013 0536   LYMPHSABS 1.4 12/12/2013 0536   MONOABS 0.7 12/12/2013 0536   EOSABS 0.4 12/12/2013 0536   BASOSABS 0.0 12/12/2013 0536    Studies/Results: No results found.  Medications: I have reviewed the patient's current medications.  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 DVT Prophylaxis/Anticoagulation: SQ Lovenox.  2. Pain Management: prn tylenol for now.  3. Mood: Seems blunted. Provide ego support. LCSW to follow for evaluation and support.  4. Neuropsych: This patient is capable of making decisions on her own behalf.  5. Lumbar laminectomy with post op I and D for infection: To continues on keflex for a year [facility].  6. SDH s/p evacuation: On keppra bid for seizures/seizure prophylaxis.  7. DM type 2: Will monitor with ac/hs checks. Continue levemir at bedtime with SSI for elevated BS. Titrate medications as needed--poor po intake reported.  8. OSA with hypercarbia: To continue CPAP.  9. Neutropenia: Question etiology. Recheck Monday.  10 Anemia of chronic disease/iron deficiency: Add iron supplement.   Cont Rx  Length of stay, days: 1  Sonda PrimesAlex Pearla Mckinny , MD 12/12/2013, 9:51 AM

## 2013-12-12 NOTE — Evaluation (Signed)
Occupational Therapy Assessment and Plan  Patient Details  Name: Cheyenne Frank MRN: [id number] Date of Birth: 10-06-1940  OT Diagnosis: muscle weakness (generalized) Rehab Potential:  good ELOS:   2 weeks  Today's Date: 12/12/2013 Time: 0800-0930    Time Calculation (min): 90 min  Problem List:  Patient Active Problem List   Diagnosis Date Noted  . SDH (subdural hematoma) 12/11/2013  . Postoperative wound infection--lumbar spine/2015 12/11/2013  . CVA (cerebral infarction) 12/11/2013  . Postoperative anemia due to acute blood loss 04/09/2013  . Diabetes 04/07/2013  . Hypertension 04/07/2013  . Unspecified sleep apnea 04/07/2013  . OA (osteoarthritis) of knee 04/06/2013    Past Medical History:  Past Medical History  Diagnosis Date  . Macular degeneration   . Complication of anesthesia     " feel like I'm dying after I wake up"  . Hypertension   . Dysrhythmia     unknown type   . Asthma     no problems since 1999 (after moving into a new home)  . Stroke 2010    verbal aphasia x 30 min - resolved - no problem since then  . Arthritis   . Itching   . GERD (gastroesophageal reflux disease)   . Nocturia   . Hx of transfusion 2011  . Diabetes mellitus without complication   . Sleep apnea     Dx 1990's unable to wear c-pap -  . Anxiety   . Diverticulosis   . Flat back syndrome   . Restless leg syndrome   . Anemia of chronic disease   . Fibromyalgia   . Chronic airway obstruction    Past Surgical History:  Past Surgical History  Procedure Laterality Date  . Tonsillectomy    . Dilation and curettage of uterus    . Abdominal hysterectomy      rt so  . Hernia repair    . Breast reduction surgery    . Cholecystectomy  1990  . Cataracts removed    . Back surgery  2010 / 2011  . Total knee arthroplasty Left 04/06/2013    Procedure: LEFT TOTAL KNEE ARTHROPLASTY;  Surgeon: Gearlean Alf, MD;  Location: [facility];  Service: Orthopedics;  Laterality: Left;  . Burr hole  for subdural hematoma  2015  . Posterior laminectomy / decompression lumbar spine      with T10-S1 fusion    Assessment & Plan Clinical Impression:Cheyenne Frank is a 73 year old female with history of OSA, HTN, and chronic back pain with multiple surgeries. She underwent L5-S1 decompression with T10-S1 fusion via anterior and posterior approach on 10/06/13 at [facility]. hosiptal course complicated by E coli bacteremia with wound infection requiring I and D on 10/16/13. 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 Patient developed acute thalamic infarcts with resultant right sided weakness with left hearing loss. She was admitted to [facility] on 11/05/13 for IV antibiotics, management of abdominal wound VAC as well as progressive therapy thorough 12/08/13. She was discharged to SNF further therapies on 12/01/13. On 12/09/13, she attempted to ambulate without assistance and sustained a fall due to dizziness. She was admitted to [facility] for work up and was found to be hypotensive. She was treated with IVF and medications adjusted to avoid orthostasis. CT lumbar spine done today showing extensive post surgical changes with anterior fusion L4-L5 and L5-S1 without bridging bone across disc space but solid fusion L1-L4 and CT thoracic spine with posterior rod T10-T12 with female  position of Left-T10 screw terminating in the paraspinal soft tissues    Patient transferred to CIR on 12/11/2013 .    Patient currently requires supervision with basic self-care skills secondary to muscle weakness and muscle paralysis, unbalanced muscle activation and decreased visual acuity.  Prior to hospitalization, patient could completeBADL with supervision.  Patient will benefit from skilled intervention to increase independence with basic self-care skills prior to discharge home with care partner.  Anticipate patient will require intermittent supervision and follow up home health.  OT - End of Session Activity  Tolerance: Tolerates 10 - 20 min activity with multiple rests Endurance Deficit: Yes OT Assessment Rehab Potential: Good OT Patient demonstrates impairments in the following area(s): Balance;Motor;Safety;Vision OT Basic ADL's Functional Problem(s): Grooming;Bathing;Dressing;Toileting;Other (comment) (vision) OT Advanced ADL's Functional Problem(s): Simple Meal Preparation;Laundry;Light Housekeeping OT Transfers Functional Problem(s): Toilet;Tub/Shower OT Additional Impairment(s): Fuctional Use of Upper Extremity (RUE strength) OT Plan OT Intensity: Minimum of 1-2 x/day, 45 to 90 minutes OT Frequency: 5 out of 7 days OT Duration/Estimated Length of Stay:  (2 weeks) OT Treatment/Interventions: Balance/vestibular training;Community reintegration;Discharge planning;DME/adaptive equipment instruction;Functional mobility training;Patient/family education;Self Care/advanced ADL retraining;Therapeutic Activities;Therapeutic Exercise;UE/LE Strength taining/ROM;UE/LE Coordination activities;Visual/perceptual remediation/compensation OT Self Feeding Anticipated Outcome(s): tindep OT Basic Self-Care Anticipated Outcome(s): mod I OT Toileting Anticipated Outcome(s): mod I OT Bathroom Transfers Anticipated Outcome(s): mod I OT Recommendation Patient destination: Home Follow Up Recommendations: Home health OT;24 hour supervision/assistance Equipment Recommended: None recommended by OT Equipment Details:  (Pt. has 4 ww, 2 ww , shower seat,  high toilet seat)   Skilled Therapeutic Intervention Pt sitting EOB upon OT arrival.  Ambulated with RW to bathroom with minimal assist.  Assist with donning/ doffing clothes due to brace.  Addressed balance, back precautions during functional activities.   OT Evaluation Precautions/Restrictions  Precautions Precautions: Fall;Other (comment);Back Precaution Comments: BACK precautions Required Braces or Orthoses: Spinal Brace (wear when exercising) Spinal Brace:  Applied in sitting position;Thoracolumbosacral orthotic General   Vital Signs Therapy Vitals Temp: 98.2 F (36.8 C) Temp src: Oral Pulse Rate: 88 Resp: 20 BP: 104/50 mmHg Patient Position, if appropriate: Lying Oxygen Therapy SpO2: 97 % O2 Device: None (Room air) Pain Pain Assessment Pain Assessment: No/denies pain Home Living/Prior Functioning Home Living Available Help at Discharge: Family Type of Home: House Home Access: Stairs to enter [employer] of Steps:  (1 step at front) Entrance Stairs-Rails: None Home Layout: Two level;Able to live on main level with bedroom/bathroom  Lives With: Daughter [name]) IADL History Homemaking Responsibilities: Yes Meal Prep Responsibility: Primary Laundry Responsibility: Primary Cleaning Responsibility: Secondary Bill Paying/Finance Responsibility: Secondary Shopping Responsibility: Primary Current License: Yes Mode of Transportation: Car Occupation: Retired Type of Occupation:  Investment banker, corporate) Leisure and Hobbies:  (fish, garden 1 year ago, croaaword puzzles) Prior Function Level of Independence: Independent with basic ADLs;Independent with gait;Independent with transfers  Able to Take Stairs?: No Driving: Yes ADL   Vision/Perception  Vision - History Baseline Vision: Wears glasses only for reading Patient Visual Report: Diplopia Vision - Assessment Eye Alignment: Within Functional Limits Vision Assessment: Vision tested Ocular Range of Motion: Restricted on the right;Restricted looking up;Restricted looking down;Impaired-to be further tested in functional context Additional Comments: right inattention Perception Perception: Impaired Inattention/Neglect: Does not attend to right visual field Praxis Praxis: Intact  Cognition Overall Cognitive Status: Impaired/Different from baseline Arousal/Alertness: Awake/alert Orientation Level: Oriented X4 Attention: Sustained Sustained Attention: Appears intact Memory:  Appears intact Awareness: Appears intact Problem Solving: Impaired Problem Solving Impairment: Verbal complex;Functional complex Executive Function: Writer: Appears intact  Safety/Judgment: Appears intact Sensation Sensation Light Touch: Appears Intact (intact for UE; numbness/tingling in both feet) Stereognosis: Appears Intact Hot/Cold: Appears Intact Proprioception: Appears Intact Coordination Gross Motor Movements are Fluid and Coordinated: Yes Fine Motor Movements are Fluid and Coordinated: No (decreased handwriting) Motor  Motor Motor: Hemiplegia Motor - Skilled Clinical Observations: head tremor-- premorbid Mobility  Transfers Sit to Stand: 4: Min assist Sit to Stand Details: Tactile cues for sequencing;Visual cues/gestures for precautions/safety;Verbal cues for precautions/safety Stand to Sit: 4: Min assist Stand to Sit Details (indicate cue type and reason): Visual cues/gestures for precautions/safety  Trunk/Postural Assessment  Cervical Assessment Cervical Assessment: Within Functional Limits Thoracic Assessment Thoracic Assessment: Within Functional Limits Lumbar Assessment Lumbar Assessment: Within Functional Limits Postural Control Postural Control: Deficits on evaluation Postural Limitations: back precautions  Balance Balance Balance Assessed: Yes Standardized Balance Assessment Standardized Balance Assessment:  (functional activities) Static Sitting Balance Static Sitting - Balance Support: No upper extremity supported;Feet supported Static Sitting - Level of Assistance: 6: Modified independent (Device/Increase time) Dynamic Sitting Balance Dynamic Sitting - Balance Support: Feet supported;Right upper extremity supported Dynamic Sitting - Level of Assistance: 4: Min assist Dynamic Sitting - Balance Activities: Trunk control activities;Reaching for objects Static Standing Balance Static Standing - Level of Assistance: 4: Min assist Dynamic  Standing Balance Dynamic Standing - Balance Support: During functional activity Dynamic Standing - Level of Assistance: 4: Min assist (ambulated with RW to bathroom) Extremity/Trunk Assessment RUE Assessment RUE Assessment: Exceptions to [facility] RUE Strength RUE Overall Strength: Deficits (4/5) LUE Assessment LUE Assessment: Within Functional Limits  FIM:  FIM - Bathing Bathing Steps Patient Completed: Chest;Right Arm;Left Arm;Abdomen;Right upper leg;Left upper leg Bathing: 3: Mod-Patient completes 5-7 [id number] parts or 50-74% FIM - Upper Body Dressing/Undressing Upper body dressing/undressing steps patient completed: Thread/unthread right sleeve of pullover shirt/dresss;Thread/unthread left sleeve of pullover shirt/dress;Put head through opening of pull over shirt/dress;Pull shirt over trunk Upper body dressing/undressing: 5: Set-up assist to: Apply TLSO, cervical collar FIM - Lower Body Dressing/Undressing Lower body dressing/undressing steps patient completed: Thread/unthread right pants leg;Thread/unthread left pants leg Lower body dressing/undressing: 2: Max-Patient completed 25-49% of tasks   Refer to Care Plan for Long Term Goals  Recommendations for other services: None  Discharge Criteria: Patient will be discharged from OT if patient refuses treatment 3 consecutive times without medical reason, if treatment goals not met, if there is a change in medical status, if patient makes no progress towards goals or if patient is discharged from hospital.  The above assessment, treatment plan, treatment alternatives and goals were discussed and mutually agreed upon: by patient  ELisa Roca3/28/2015, 7:34 PM

## 2013-12-13 ENCOUNTER — Inpatient Hospital Stay (HOSPITAL_COMMUNITY): Payer: Medicare Other | Admitting: *Deleted

## 2013-12-13 DIAGNOSIS — M1710 Unilateral primary osteoarthritis, unspecified knee: Secondary | ICD-10-CM

## 2013-12-13 DIAGNOSIS — IMO0002 Reserved for concepts with insufficient information to code with codable children: Secondary | ICD-10-CM

## 2013-12-13 LAB — GLUCOSE, CAPILLARY
GLUCOSE-CAPILLARY: 120 mg/dL — AB (ref 70–99)
GLUCOSE-CAPILLARY: 160 mg/dL — AB (ref 70–99)
GLUCOSE-CAPILLARY: 151 mg/dL — AB (ref 70–99)
Glucose-Capillary: 152 mg/dL — ABNORMAL HIGH (ref 70–99)

## 2013-12-13 NOTE — Progress Notes (Signed)
Bed alarm alerted staff. Patient voided incontinently after standing at EOB. Essential tremor noted to head. Abd dressing changed at HS. CPAP applied, patient removed several times.  Cheyenne MartinezMurray, Shakiyah Cirilo A

## 2013-12-13 NOTE — Progress Notes (Signed)
Pt did not wish to wear CPAP tonight. Pt stated she did not tolerate well the previous night. Pt encouraged to call RT if she changes her mind. No distress noted.

## 2013-12-13 NOTE — Progress Notes (Signed)
Physical Therapy Session Note  Patient Details  Name: Jetty PeeksGeneva Johndrow MRN: [id number] Date of Birth: 07/29/1941  Today's Date: 12/13/2013 Time: [id number] Time Calculation (min): 60 min    Skilled Therapeutic Interventions/Progress Updates:  Patient resting in bed in supine, no c/o of pain, assistance in performing supine to sit with recalling back precautions, min A to perform movement, patient needs mod A to don lower body clothing.Transfer to w/c with min A. NuStep 2 x 5 min with increased rest break due to fatigue.  Stairs training 2 x 3 steps up/down with min A, step to pattern. Gait training 2 x 50 feet with min A,and cues for step length and posture.  Patient education on back precautions and safety measures.  W/c propulsion to and from gym with B UE, mod A.Patient transferred back to bed with min A, all needs within reach and bed alarm on.  Patient VS have been monitored closely pre tx:BP 117/66 HR 96 SaO2 95%, post tx; BP 102/67 HR 101 SaO2 96% Patient fatigues very easily and needs increased time to recover and return to activity.  Therapy Documentation Precautions:  Precautions Precautions: Fall;Back Precaution Comments: Back/Spinal precautions Required Braces or Orthoses: Spinal Brace Spinal Brace: Thoracolumbosacral orthotic;Applied in sitting position Restrictions Weight Bearing Restrictions: No Pain: Pain Assessment Pain Assessment: No/denies pain  See FIM for current functional status  Therapy/Group: Individual Therapy  Dorna MaiCzajkowska, Wylan Gentzler W 12/13/2013, 10:47 AM

## 2013-12-13 NOTE — Progress Notes (Signed)
Cheyenne Frank is a 73 y.o. female 02/19/1941 [id number]  Subjective: No new complaints. No new problems. Slept well. Feeling OK.  Objective: Vital signs in last 24 hours: Temp:  [97.9 F (36.6 C)-98.2 F (36.8 C)] 97.9 F (36.6 C) (03/29 0532) Pulse Rate:  [87-88] 87 (03/29 0532) Resp:  [19-20] 19 (03/29 0532) BP: (104-117)/(50) 117/50 mmHg (03/29 0532) SpO2:  [95 %-97 %] 95 % (03/29 0532) Weight change:  Last BM Date: 12/12/13  Intake/Output from previous day: 03/28 0701 - 03/29 0700 In: 360 [P.O.:360] Out: -  Last cbgs: CBG (last 3)   Recent Labs  12/12/13 1652 12/12/13 2117 12/13/13 0727  GLUCAP 153* 191* 120*     Physical Exam General: No apparent distress  Eating breakfast HEENT: not dry Lungs: Normal effort. Lungs clear to auscultation, no crackles or wheezes. Cardiovascular: Regular rate and rhythm, no edema Abdomen: S/NT/ND; BS(+) Musculoskeletal:  unchanged Neurological: No new neurological deficits Wounds: N/A    Skin: clear  Aging changes Mental state: Alert, oriented, cooperative    Lab Results: BMET    Component Value Date/Time   NA 141 11/29/2013 0538   K 4.7 11/29/2013 0538   CL 100 11/29/2013 0538   CO2 25 11/29/2013 0538   GLUCOSE 117* 11/29/2013 0538   BUN 26* 11/29/2013 0538   CREATININE 0.68 11/29/2013 0538   CALCIUM 9.6 11/29/2013 0538   GFRNONAA 85* 11/29/2013 0538   GFRAA >90 11/29/2013 0538   CBC    Component Value Date/Time   WBC 2.5* 12/12/2013 0536   RBC 3.36* 12/12/2013 0536   HGB 8.8* 12/12/2013 0536   HCT 28.8* 12/12/2013 0536   PLT 300 12/12/2013 0536   MCV 85.7 12/12/2013 0536   MCH 26.2 12/12/2013 0536   MCHC 30.6 12/12/2013 0536   RDW 16.5* 12/12/2013 0536   LYMPHSABS 1.4 12/12/2013 0536   MONOABS 0.7 12/12/2013 0536   EOSABS 0.4 12/12/2013 0536   BASOSABS 0.0 12/12/2013 0536    Studies/Results: No results found.  Medications: I have reviewed the patient's current medications.  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 DVT Prophylaxis/Anticoagulation: SQ Lovenox.  2. Pain Management: prn tylenol for now.  3. Mood: Seems blunted. Provide ego support. LCSW to follow for evaluation and support.  4. Neuropsych: This patient is capable of making decisions on her own behalf.  5. Lumbar laminectomy with post op I and D for infection: To [facility]  6. SDH s/p evacuation: On keppra bid for seizures/seizure prophylaxis.  7. DM type 2: Will monitor with ac/hs checks. Continue levemir at bedtime with SSI for elevated BS. Titrate medications as needed--poor po intake reported.  8. OSA with hypercarbia: To continue CPAP.  9. Neutropenia: Question etiology. Recheck Monday.  10 Anemia of chronic disease/iron deficiency: Add iron supplement.     Length of stay, days: 2  Sonda PrimesAlex Mardy Lucier , MD 12/13/2013, 8:52 AM

## 2013-12-14 ENCOUNTER — Inpatient Hospital Stay (HOSPITAL_COMMUNITY): Payer: Medicare Other

## 2013-12-14 ENCOUNTER — Inpatient Hospital Stay (HOSPITAL_COMMUNITY): Payer: Medicare Other | Admitting: Physical Therapy

## 2013-12-14 DIAGNOSIS — D509 Iron deficiency anemia, unspecified: Secondary | ICD-10-CM

## 2013-12-14 DIAGNOSIS — I6350 Cerebral infarction due to unspecified occlusion or stenosis of unspecified cerebral artery: Secondary | ICD-10-CM

## 2013-12-14 DIAGNOSIS — E119 Type 2 diabetes mellitus without complications: Secondary | ICD-10-CM

## 2013-12-14 DIAGNOSIS — S065XAA Traumatic subdural hemorrhage with loss of consciousness status unknown, initial encounter: Secondary | ICD-10-CM

## 2013-12-14 DIAGNOSIS — S065X9A Traumatic subdural hemorrhage with loss of consciousness of unspecified duration, initial encounter: Secondary | ICD-10-CM

## 2013-12-14 LAB — CBC WITH DIFFERENTIAL/PLATELET
BAND NEUTROPHILS: 0 % (ref 0–10)
BASOS ABS: 0.1 10*3/uL (ref 0.0–0.1)
BASOS PCT: 1 % (ref 0–1)
BASOS PCT: 2 % — AB (ref 0–1)
BLASTS: 0 %
Band Neutrophils: 0 % (ref 0–10)
Basophils Absolute: 0 10*3/uL (ref 0.0–0.1)
Blasts: 0 %
Eosinophils Absolute: 0.2 10*3/uL (ref 0.0–0.7)
Eosinophils Absolute: 0.6 10*3/uL (ref 0.0–0.7)
Eosinophils Relative: 8 % — ABNORMAL HIGH (ref 0–5)
Eosinophils Relative: 18 % — ABNORMAL HIGH (ref 0–5)
HCT: 31 % — ABNORMAL LOW (ref 36.0–46.0)
HEMATOCRIT: 28.7 % — AB (ref 36.0–46.0)
HEMOGLOBIN: 8.7 g/dL — AB (ref 12.0–15.0)
HEMOGLOBIN: 9.5 g/dL — AB (ref 12.0–15.0)
LYMPHS ABS: 1.5 10*3/uL (ref 0.7–4.0)
LYMPHS ABS: 1.2 10*3/uL (ref 0.7–4.0)
Lymphocytes Relative: 60 % — ABNORMAL HIGH (ref 12–46)
Lymphocytes Relative: 41 % (ref 12–46)
MCH: 25.8 pg — AB (ref 26.0–34.0)
MCH: 26.2 pg (ref 26.0–34.0)
MCHC: 30.3 g/dL (ref 30.0–36.0)
MCHC: 30.6 g/dL (ref 30.0–36.0)
MCV: 85.2 fL (ref 78.0–100.0)
MCV: 85.6 fL (ref 78.0–100.0)
METAMYELOCYTES PCT: 0 %
MYELOCYTES: 0 %
Metamyelocytes Relative: 0 %
Monocytes Absolute: 0.7 10*3/uL (ref 0.1–1.0)
Monocytes Absolute: 1.1 10*3/uL — ABNORMAL HIGH (ref 0.1–1.0)
Monocytes Relative: 31 % — ABNORMAL HIGH (ref 3–12)
Monocytes Relative: 37 % — ABNORMAL HIGH (ref 3–12)
Myelocytes: 0 %
NEUTROS ABS: 0 10*3/uL — AB (ref 1.7–7.7)
NRBC: 0 /100{WBCs}
Neutro Abs: 0.1 10*3/uL — ABNORMAL LOW (ref 1.7–7.7)
Neutrophils Relative %: 0 % — ABNORMAL LOW (ref 43–77)
Neutrophils Relative %: 2 % — ABNORMAL LOW (ref 43–77)
Platelets: 278 10*3/uL (ref 150–400)
Platelets: 318 10*3/uL (ref 150–400)
Promyelocytes Absolute: 0 %
Promyelocytes Absolute: 0 %
RBC: 3.37 MIL/uL — ABNORMAL LOW (ref 3.87–5.11)
RBC: 3.62 MIL/uL — ABNORMAL LOW (ref 3.87–5.11)
RDW: 16.9 % — ABNORMAL HIGH (ref 11.5–15.5)
RDW: 16.8 % — ABNORMAL HIGH (ref 11.5–15.5)
WBC: 2.4 10*3/uL — AB (ref 4.0–10.5)
WBC: 3.1 10*3/uL — ABNORMAL LOW (ref 4.0–10.5)
nRBC: 0 /100 WBC

## 2013-12-14 LAB — COMPREHENSIVE METABOLIC PANEL
ALBUMIN: 2.7 g/dL — AB (ref 3.5–5.2)
ALT: 12 U/L (ref 0–35)
AST: 14 U/L (ref 0–37)
Alkaline Phosphatase: 97 U/L (ref 39–117)
BUN: 18 mg/dL (ref 6–23)
CALCIUM: 9.2 mg/dL (ref 8.4–10.5)
CHLORIDE: 103 meq/L (ref 96–112)
CO2: 26 mEq/L (ref 19–32)
CREATININE: 0.75 mg/dL (ref 0.50–1.10)
GFR calc Af Amer: >90 mL/min (ref >90)
GFR calc non Af Amer: 83 mL/min — ABNORMAL LOW (ref >90)
Glucose, Bld: 107 mg/dL — ABNORMAL HIGH (ref 70–99)
Potassium: 4.5 mEq/L (ref 3.7–5.3)
Sodium: 143 mEq/L (ref 137–147)
TOTAL PROTEIN: 6.6 g/dL (ref 6.0–8.3)

## 2013-12-14 LAB — CULTURE, BLOOD (SINGLE)

## 2013-12-14 LAB — GLUCOSE, CAPILLARY
GLUCOSE-CAPILLARY: 126 mg/dL — AB (ref 70–99)
Glucose-Capillary: 101 mg/dL — ABNORMAL HIGH (ref 70–99)
Glucose-Capillary: 127 mg/dL — ABNORMAL HIGH (ref 70–99)
Glucose-Capillary: 143 mg/dL — ABNORMAL HIGH (ref 70–99)

## 2013-12-14 LAB — URINE MICROSCOPIC-ADD ON

## 2013-12-14 LAB — URINALYSIS, ROUTINE W REFLEX MICROSCOPIC
BILIRUBIN URINE: NEGATIVE
GLUCOSE, UA: NEGATIVE mg/dL
Ketones, ur: NEGATIVE mg/dL
Leukocytes, UA: NEGATIVE
Nitrite: NEGATIVE
PH: 5 (ref 5.0–8.0)
Protein, ur: NEGATIVE mg/dL
SPECIFIC GRAVITY, URINE: 1.017 (ref 1.005–1.030)
Urobilinogen, UA: 0.2 mg/dL (ref 0.0–1.0)

## 2013-12-14 MED ORDER — LISINOPRIL 10 MG PO TABS
10.0000 mg | ORAL_TABLET | Freq: Every day | ORAL | Status: DC
  Administered 2013-12-15: 10 mg via ORAL
  Filled 2013-12-14 (×3): qty 1

## 2013-12-14 NOTE — Progress Notes (Signed)
Speech Language Pathology Daily Session Note  Patient Details  Name: Cheyenne Frank MRN: [id number] Date of Birth: 12/19/1940  Today's Date: 12/14/2013 Time: [id number] Time Calculation (min): 43 min  Short Term Goals: Week 1: SLP Short Term Goal 1 (Week 1): Patient will utilize strategies to increase word finding ability at the phrase level with min A multimodal cueing.  SLP Short Term Goal 2 (Week 1): Pt will demonstrate basic problem solving and safety awareness to utilize call bell as needed with Min cues SLP Short Term Goal 3 (Week 1): Pt will utilize external memory aids to recall new anddaily information with supervision level cueing SLP Short Term Goal 4 (Week 1): Pt will verbally communicate basic wants/needs with supervision level cueing  Skilled Therapeutic Interventions: Treatment focused on continued differential diagnosis of cognitive-linguistic abilities, with daughter present. SLP facilitated session with Min faded to supervision level cueing for use of schedule as external memory aid, as well as supervision level cueing for basic time calculations. Pt required supervision level cueing and extra time for self-correction with 4-step sequencing task. Pt verbally described the pictures in sequence at the phrase level, although throughout conversation was noted to exhibit anomia and increased difficulty communicating. Pt attended to structured task without cues for redirection, although when RN entered the room she became increasingly distracted. Pt's daughter also reports increased impulsivity resulting in decreased safety awareness. LTGs and STGs are adjusted accordingly with new information received during treatment today. Continue plan of care.    FIM:  Comprehension Comprehension Mode: Auditory Comprehension: 5-Follows basic conversation/direction: With extra time/assistive device Expression Expression Mode: Verbal Expression: 3-Expresses basic 50 - 74% of the time/requires  cueing 25 - 50% of the time. Needs to repeat parts of sentences. Social Interaction Social Interaction: 5-Interacts appropriately 90% of the time - Needs monitoring or encouragement for participation or interaction. Problem Solving Problem Solving: 4-Solves basic 75 - 89% of the time/requires cueing 10 - 24% of the time Memory Memory: 4-Recognizes or recalls 75 - 89% of the time/requires cueing 10 - 24% of the time  Pain Pain Assessment Pain Assessment: No/denies pain  Therapy/Group: Individual Therapy   Maxcine HamLaura Paiewonsky, M.A. CCC-SLP [phone]  [name], [name] 12/14/2013, 12:05 PM

## 2013-12-14 NOTE — IPOC Note (Signed)
Overall Plan of Care [facility](IPOC) Patient Details Name: Cheyenne PeeksGeneva Frank MRN: [id number] DOB: 06/01/1941  Admitting Diagnosis: CVA  Hospital Problems: Active Problems:   CVA (cerebral infarction)     Functional Problem List: Nursing Endurance;Motor;Nutrition;Pain;Safety;Skin Integrity  PT Balance;Endurance;Pain;Safety  OT Balance;Motor;Safety;Vision  SLP Cognition;Linguistic  TR         Basic ADL's: OT Grooming;Bathing;Dressing;Toileting;Other (comment) (vision)     Advanced  ADL's: OT Simple Meal Preparation;Laundry;Light Housekeeping     Transfers: PT Bed Mobility;Bed to Chair;Car;Furniture  OT Toilet;Tub/Shower     Locomotion: PT Ambulation;Stairs     Additional Impairments: OT Fuctional Use of Upper Extremity (RUE strength)  SLP None      TR      Anticipated Outcomes Item Anticipated Outcome  Self Feeding tindep  Swallowing  N/A   Basic self-care  mod I  Toileting  mod I   Bathroom Transfers mod I  Bowel/Bladder  Pt will be continent of bowel and bladder with min assist  Transfers  S/Mod-Independent  Locomotion  using LRAD x 250' S/Mod-Independent  Communication  Patient will verbally communicate wants/needs to family/staff/community.  Cognition  Patient will be able to solve complex problems independently, to ensure independence.   Pain  Pain will be equal or less than 3 on a scale of 0-10 with min assis  Safety/Judgment  Pt will remain free from injury/falls with min assist   Therapy Plan: PT Intensity: Minimum of 1-2 x/day ,45 to 90 minutes PT Frequency: 5 out of 7 days PT Duration Estimated Length of Stay: 10-14 days OT Intensity: Minimum of 1-2 x/day, 45 to 90 minutes OT Frequency: 5 out of 7 days OT Duration/Estimated Length of Stay:  (2 weeks) SLP Frequency: 5 out of 7 days SLP Duration/Estimated Length of Stay: 10 days       Team Interventions: Nursing Interventions Patient/Family Education;Pain Management;Medication Management;Skin  Care/Wound Management;Cognitive Remediation/Compensation;Discharge Planning  PT interventions Ambulation/gait training;Balance/vestibular training;DME/adaptive equipment instruction;Functional mobility training;Pain management;Patient/family education;Stair training;Therapeutic Activities;Therapeutic Exercise;UE/LE Strength taining/ROM  OT Interventions Balance/vestibular training;Community reintegration;Discharge planning;DME/adaptive equipment instruction;Functional mobility training;Patient/family education;Self Care/advanced ADL retraining;Therapeutic Activities;Therapeutic Exercise;UE/LE Strength taining/ROM;UE/LE Coordination activities;Visual/perceptual remediation/compensation  SLP Interventions Cognitive remediation/compensation;Speech/Language facilitation;Therapeutic Activities;Patient/family education;Oral motor exercises  TR Interventions    SW/CM Interventions Discharge Planning;Psychosocial Support;Patient/Family Education    Team Discharge Planning: Destination: PT-Home ,OT- Home , SLP-Home Projected Follow-up: PT-Home health PT, OT-  Home health OT;24 hour supervision/assistance, SLP-None Projected Equipment Needs: PT- , OT- None recommended by OT, SLP-None recommended by SLP Equipment Details: PT-To Be Determined closer to discharge, OT- (Pt. has 4 ww, 2 ww , shower seat,  high toilet seat) Patient/family involved in discharge planning: PT- Patient,  OT-Patient, SLP-Patient  MD ELOS: 12 days Medical Rehab Prognosis:  Excellent Assessment: The patient has been admitted for CIR therapies. The team will be addressing, functional mobility, strength, stamina, balance, safety, adaptive techniques/equipment, self-care, bowel and bladder mgt, patient and caregiver education, NMR, cognitive perceptual awareness, language, coping, ego-support., pain control..  Goals have been set at supervision to mod I for basic mobility, self-care, cognition/language.    Ranelle OysterZachary T. Swartz, MD,  FAAPMR      See Team Conference Notes for weekly updates to the plan of care

## 2013-12-14 NOTE — Progress Notes (Signed)
Occupational Therapy Session Note  Patient Details  Name: Cheyenne Frank MRN: [id number] Date of Birth: 05/20/1941  Today's Date: 12/14/2013 Time: 0930-1030 and [id number]  Time Calculation (min): 60 min and 26 min   Short Term Goals: Week 1:  OT Short Term Goal 1 (Week 1): Pt. will be supervison with LB dressing OT Short Term Goal 2 (Week 1): Pt. will be supervision with toilet transfers OT Short Term Goal 3 (Week 1): Pt. will be supervision with shower transfers  Skilled Therapeutic Interventions/Progress Updates:    Session 1: Pt seen for ADL retraining with focus on use of AE, adherence to back precautions, and standing balance. Pt received sitting in w/c with daughter present. Pt verbalized 3/3 back precautions. Pt declining shower this AM and asking to bathe at sink from w/c level. Pt required min cues throughout B&D to follow back precautions. Utilized LH sponge with min instructional cues for technique. Pt using her own reacher with increased difficulty secondary to length of reacher. Pt agreeable to practice with shorter one tomorrow. Pt completed sit<>stand x3 throughout session with min assist and min cues initially for hand placement. Pt orthostatic during sit<>stand with BP 94/51 and report of dizziness. Pt with decreased attention to right noted when attempting to locate toothbrush and soap, requiring min cues to scan to right to locate.   Session 2: Pt seen for 1:1 OT session with focus on visual scanning and eye ROM. Pt received sitting in w/c. Engaged in word search activity with paper placed on table in front of her and placed on vertical wall. Pt scanning L and R with no difficulty, however tended to favor attending to lower quadrants when on vertical surface. Pt reporting no blurriness or diplopia during task and able to locate 5 words. At end of session pt completed stand pivot transfer w/c>bed with RW and min assist. Utilized reacher to doff pants from ankle. Pt completed  sit>supine with min assist. Pt left with all needs in reach and daughter present.  Therapy Documentation Precautions:  Precautions Precautions: Fall;Back Precaution Comments: Back/Spinal precautions Required Braces or Orthoses: Spinal Brace Spinal Brace: Thoracolumbosacral orthotic;Applied in sitting position Restrictions Weight Bearing Restrictions: No General:   Vital Signs: Therapy Vitals Pulse Rate: 116 BP: 86/50 mmHg Patient Position, if appropriate: Standing (Symptomatic; Ted hose on) Oxygen Therapy SpO2: 99 % O2 Device: None (Room air) Pain: Pt with no report of pain during therapy sessions.   Other Treatments:    See FIM for current functional status  Therapy/Group: Individual Therapy  Gibril Mastro, Vara GuardianKayla N 12/14/2013, 11:15 AM

## 2013-12-14 NOTE — Progress Notes (Signed)
PRN trazodone given at HS & effective. Wears CPAP most of night, but then removes and declines to put back on. 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 Dressing changes BID to abd, abd almost healed. [facility].  Obvious cognitive deficits. Congested non-productive cough at times. Cheyenne Frank, Cheyenne Frank

## 2013-12-14 NOTE — Progress Notes (Signed)
[facility] PHYSICAL MEDICINE & REHABILITATION     PROGRESS NOTE    Subjective/Complaints: BP low over the weekend. "could feel it" and it made her feel a little woozy  Objective: Vital Signs: Blood pressure 123/63, pulse 80, temperature 98 F (36.7 C), temperature source Oral, resp. rate 18, SpO2 97.00%. No results found.  Recent Labs  12/12/13 0536 12/14/13 0645  WBC 2.5* 2.4*  HGB 8.8* 8.7*  HCT 28.8* 28.7*  PLT 300 278   No results found for this basename: NA, K, CL, CO, GLUCOSE, BUN, CREATININE, CALCIUM,  in the last 72 hours CBG (last 3)   Recent Labs  12/13/13 1648 12/13/13 2114 12/14/13 0712  GLUCAP 151* 152* 101*    Wt Readings from Last 3 Encounters:  04/06/13 104.327 kg (230 lb)  04/06/13 104.327 kg (230 lb)  03/31/13 104.327 kg (230 lb)    Physical Exam:  Constitutional: She is oriented to person, place, and time. She appears well-developed and well-nourished.  HENT:  Head: Normocephalic and atraumatic.  Right Ear: External ear normal.  Left Ear: External ear normal.  Eyes: Conjunctivae and EOM are normal. Pupils are equal, round, and reactive to light.  Neck: Normal range of motion. Neck supple. No JVD present. No tracheal deviation present. No thyromegaly present.  Cardiovascular: Normal rate and regular rhythm. Exam reveals no gallop and no friction rub.  No murmur heard.  Pulmonary/Chest: Effort normal and breath sounds normal. No respiratory distress. She has no wheezes. She has no rales. She exhibits no tenderness.  Abdominal: Soft. Bowel sounds are normal. She exhibits no distension. There is no tenderness. There is no rebound.  Midline incision with 3 cm superficially dehisced area. Long thoracolumbar incision healing well with minimal drainage on dressing (proximal aspect).  Musculoskeletal: She exhibits no edema and no tenderness.  Lymphadenopathy:  She has no cervical adenopathy.  Neurological: She is alert and oriented to person, place,  and time.  Flat affect with delayed responses. Able to ID simple objects with minimal delay. Has more difficulty with sentences and phrases but communicates fairly well with extra time. Speech clear. Able to follow basic commands without difficulty. Mild memory deficits. Right sided weakness with decrease in motor control. RUE 4/5 with decreased FMC, LUE 4+. RLE 3/5 HF to 4/5 ADF/APF. LLE 4- HF to 4+/5 distally. No gross sensory deficits. CN exam unremarkable.  Skin: Skin is warm and dry.  Stage 2 wound in hypogastric region with 1cm x 6cm area of pink granulation. Small, 1cm area of granulation around incision in back. Scar on left scalp well healed.  Psychiatric: Her behavior is normal.    Assessment/Plan: 1. 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  which require 3+ hours per day of interdisciplinary therapy in a comprehensive inpatient rehab setting. Physiatrist is providing close team supervision and 24 hour management of active medical problems listed below. Physiatrist and rehab team continue to assess barriers to discharge/monitor patient progress toward functional and medical goals. FIM: FIM - Bathing Bathing Steps Patient Completed: Chest;Right Arm;Left Arm;Abdomen;Right upper leg;Left upper leg Bathing: 3: Mod-Patient completes 5-7 56f 10 parts or 50-74%  FIM - Upper Body Dressing/Undressing Upper body dressing/undressing steps patient completed: Thread/unthread right sleeve of pullover shirt/dresss;Thread/unthread left sleeve of pullover shirt/dress;Put head through opening of pull over shirt/dress;Pull shirt over trunk Upper body dressing/undressing: 5: Set-up assist to: Apply TLSO, cervical collar FIM - Lower Body Dressing/Undressing Lower body dressing/undressing steps patient completed:  Thread/unthread right pants leg;Thread/unthread left pants leg Lower body dressing/undressing: 2: Max-Patient completed  25-49% of tasks  FIM - Hotel managerToileting Toileting Assistive Devices: Grab bar or rail for support;Toilet Aid/prosthesis/orthosis Toileting: 1: Total-Patient completed zero steps, helper did all 3  FIM - Diplomatic Services operational officerToilet Transfers Toilet Transfers Assistive Devices: Grab bars;Walker Toilet Transfers: 4-To toilet/BSC: Min A (steadying Pt. > 75%);4-From toilet/BSC: Min A (steadying Pt. > 75%)  FIM - Bed/Chair Transfer Bed/Chair Transfer Assistive Devices: Therapist, occupationalWalker Bed/Chair Transfer: 4: Supine > Sit: Min A (steadying Pt. > 75%/lift 1 leg);4: Bed > Chair or W/C: Min A (steadying Pt. > 75%)  FIM - Locomotion: Wheelchair Locomotion: Wheelchair: 2: Travels 50 - 149 ft with moderate assistance (Pt: 50 - 74%) FIM - Locomotion: Ambulation Locomotion: Ambulation Assistive Devices: Designer, industrial/productWalker - Rolling Ambulation/Gait Assistance: 4: Min assist Locomotion: Ambulation: 1: Travels less than 50 ft with minimal assistance (Pt.>75%)  Comprehension Comprehension Mode: Auditory Comprehension: 5-Follows basic conversation/direction: With extra time/assistive device  Expression Expression Mode: Verbal Expression: 5-Expresses complex 90% of the time/cues < 10% of the time  Social Interaction Social Interaction: 5-Interacts appropriately 90% of the time - Needs monitoring or encouragement for participation or interaction.  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 DVT Prophylaxis/Anticoagulation: SQ Lovenox.  2. Pain Management: prn tylenol for now.  3. Mood: Seems blunted. Provide ego support. LCSW to follow for evaluation and support.  4. Neuropsych: This patient is capable of making decisions on her own behalf.  5. Lumbar laminectomy with post op I and D for infection: To  [facility]  6. SDH s/p evacuation: On keppra bid for seizures/seizure prophylaxis.  7. DM type 2: Will monitor with ac/hs checks. Continue levemir at bedtime with SSI for elevated BS.   -reasonable control at present 8. OSA with hypercarbia: To continue CPAP.  9. Neutropenia: Question etiology. Diff pending.  10 Anemia of chronic disease/iron deficiency: Added iron supplement. hgb 8.7 11. CV- decrease prinivil to 10mg  daily. Continue coreg  LOS (Days) 3 A FACE TO FACE EVALUATION WAS PERFORMED  Lynia Landry T 12/14/2013 7:53 AM

## 2013-12-14 NOTE — Progress Notes (Signed)
Patient with complaints of chills. Nurse reports family with concerns that patient is getting septic.  She was examined VSS and skin warm to touch. Room is cool and seems to have AC on. Patient alert and appropriate--asking about labs this am. Leucopenia continues--question etiology. ANC WNL. Will recheck in am. .

## 2013-12-14 NOTE — Progress Notes (Signed)
Patient refusing CPAP at this time.  Was told if she changed her mind to call RT.

## 2013-12-14 NOTE — Progress Notes (Signed)
Social Work Assessment and Plan Social Work Assessment and Plan  Patient Details  Name: Cheyenne Frank MRN: [id number] Date of Birth: [birth year]  Today's Date: 12/14/2013  Problem List:  Patient Active Problem List   Diagnosis Date Noted  . SDH (subdural hematoma) 12/11/2013  . Postoperative wound infection--lumbar spine/2015 12/11/2013  . CVA (cerebral infarction) 12/11/2013  . Postoperative anemia due to acute blood loss 04/09/2013  . Diabetes 04/07/2013  . Hypertension 04/07/2013  . Unspecified sleep apnea 04/07/2013  . OA (osteoarthritis) of knee 04/06/2013   Past Medical History:  Past Medical History  Diagnosis Date  . Macular degeneration   . Complication of anesthesia     " feel like I'm dying after I wake up"  . Hypertension   . Dysrhythmia     unknown type   . Asthma     no problems since 1999 (after moving into a new home)  . Stroke 2010    verbal aphasia x 30 min - resolved - no problem since then  . Arthritis   . Itching   . GERD (gastroesophageal reflux disease)   . Nocturia   . Hx of transfusion 2011  . Diabetes mellitus without complication   . Sleep apnea     Dx 1990's unable to wear c-pap -  . Anxiety   . Diverticulosis   . Flat back syndrome   . Restless leg syndrome   . Anemia of chronic disease   . Fibromyalgia   . Chronic airway obstruction    Past Surgical History:  Past Surgical History  Procedure Laterality Date  . Tonsillectomy    . Dilation and curettage of uterus    . Abdominal hysterectomy      rt so  . Hernia repair    . Breast reduction surgery    . Cholecystectomy  1990  . Cataracts removed    . Back surgery  2010 / 2011  . 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 Burr hole for subdural hematoma  2015  . Posterior laminectomy / decompression lumbar spine      with T10-S1 fusion   Social  History:  reports that she has never smoked. She does not have any smokeless tobacco history on file. She reports that she does not drink alcohol or use illicit drugs.  Family / Support Systems Marital Status: Widow/Widower Patient Roles: Parent;Other (Comment) (Retiree) Children: Cheyenne Frank [phone] Other Supports: Cheyenne Asp Frank-daughter whom lives with pt [phone] Anticipated Caregiver: [name] Ability/Limitations of Caregiver: [name] has MS and back issues can only provide supervision level Caregiver Availability: 24/7 Family Dynamics: Close knit family who are supportive and involved with her daughter's. One daughter lives with pt and they ahve a close realationship.  Other daughte r ives 5 minutes away.  Both will be here and involved in her care while here.  Social History Preferred language: English Religion: Unknown Cultural Background: No issues Education: BSN Read: Yes Write: Yes Employment Status: Retired Date Retired/Disabled/Unemployed: Retired Materials engineer Issues: No issues Guardian/Conservator: None-according to MD pt is capable of making her own decisions while here.  Daughter's plan to be here often and will be involved.   Abuse/Neglect Physical Abuse: Denies Verbal Abuse: Denies Sexual Abuse: Denies Exploitation of patient/patient's resources: Denies Self-Neglect: Denies  Emotional Status Pt's affect, behavior adn adjustment status: Pt is motivated and ready to go  home.  She states; " This is my last stop before going home."  She is doing well considering everything that has happened to her.  She is confident she will get to mod.i level before discharge. Recent Psychosocial Issues: Other medical issues and complications since back surgery. Pyschiatric History: No history deferred depression screen due to doing well and open with her concerns and deals with difficulties with her strong faith.  Daughter affirms pt is doing well and is a tough  lady. Substance Abuse History: No issues  Patient / Family Perceptions, Expectations & Goals Pt/Family understanding of illness & functional limitations: Pt and daughter have a good understanding of her condition and medical issues.  They ask MD questions when rounding and feel they are addressed.  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  Still both processing all thjat has happened since surgery. Premorbid pt/family roles/activities: Mom, Grandmother, Retiree, Home owner, [employer] member, etc Anticipated changes in roles/activities/participation: resume Pt/family expectations/goals: Pt states; " I will get independent  by the time I leave here."  Daugther states: " I hope she does well, my sister can only do supervision due to her MS and back issues."  [employer] Resources Community Agencies: Other (Comment) ([facility] Premorbid Home Care/DME Agencies: None Transportation available at discharge: Family Resource referrals recommended: Support group (specify) ([facility] Support group)  Discharge Planning Living Arrangements: Children Support Systems: Children;Other relatives;Friends/neighbors;Church/faith community Type of Residence: Private residence Insurance Resources: AdministratorMedicare;Private Insurance (specify) Education officer, museum(UHC) Financial Resources: Tree surgeonocial Security;Other (Comment) (pension [facility]) Financial Screen Referred: No Living Expenses: Own Money Management: Patient Does the patient have any problems obtaining your medications?: No Home Management: Self and daughter helps Patient/Family Preliminary Plans: Return home with daughter who can provide supervision level due to her own health issues.  Pt's other daughter is supportive and close by and will be here to assist.  Pt should do well here and get to team's goals of mod/i-supervision level. Social Work Anticipated Follow Up Needs: HH/OP;Support Group  Clinical Impression Pleasant motivated pt who is wiling  to do what it takes to regain her independence, her daughters are supportive and willing to assist her.  She has had a long road and is doing well Considering all that she has been through with this back surgery.  Work toward discharge.   Lucy Chrisupree, Krisi Azua G 12/14/2013, 9:45 AM

## 2013-12-14 NOTE — Progress Notes (Signed)
Physical Therapy Session Note  Patient Details  Name: Cheyenne Frank MRN: [id number] Date of Birth: 07/10/1941  Today's Date: 12/14/2013 Time: 0808-0902 Time Calculation (min): 54 min  Short Term Goals: Week 1:  PT Short Term Goal 1 (Week 1): Patient will be able to perform bed mobility with S/Mod-Independent assist. PT Short Term Goal 2 (Week 1): Patient will be able to perform transfers with S/Mod-Independent assist. PT Short Term Goal 3 (Week 1): Patient will be able to ambulate using LRAD x 180' with S/Mod-Independent assist.  Skilled Therapeutic Interventions/Progress Updates:    Pt received seated EOB with daughter present. Pt agreeable to therapy. Session focused on addressing pt symptoms of orthostatic hypotension secondary to significant decrease in BP and pt symptoms of dizziness, lightheadedness with sit>stand transfer. Pt able to verbalize 1 of 3 spinal precautions. Pt also able to effectively verbally direct this therapist through ~75% of steps for donning TLSO while pt seated EOB.   Performed sit>stand from EOB with rolling walker. Immediately upon standing, pt reports dizziness, lightheadedness. Orthostatic vital signs taken. See vitals signs for detailed readings. When notified, RN recommended donning thigh high Ted hose. After donning Ted hose, performed sit>stand from EOB with rolling walker; SBP decreased 19 mm Hg (see vitals for details) and pt reported onset of symptoms ~10 seconds after standing. After seated rest break and dissipation of symptoms, performed sit>stand, then immediately performed mini squats x10 reps with bilat UE support at rolling walker; no onset of dizziness. Transitioned to gait x12' in controlled environment with rolling walker, min guard; gait trial ended, per pt request, secondary to fatigue. Therapist departed with pt seated in bedside chair with quick release belt on for safety, daughter present, and all needs within reach.   Therapy  Documentation Precautions:  Precautions Precautions: Fall;Back Precaution Comments: Back/Spinal precautions Required Braces or Orthoses: Spinal Brace Spinal Brace: Thoracolumbosacral orthotic;Applied in sitting position Restrictions Weight Bearing Restrictions: No General: Amount of Missed PT Time (min): 6 Minutes Missed Time Reason: Nursing care Pain: Pain Assessment Pain Assessment: No/denies pain Locomotion : Ambulation Ambulation/Gait Assistance: 4: Min guard   See FIM for current functional status  Therapy/Group: Individual Therapy  [name], Lorenda IshiharaBlair A 12/14/2013, 12:45 PM

## 2013-12-14 NOTE — Progress Notes (Signed)
Patient information reviewed and entered into eRehab system by Currie Dennin, RN, CRRN, PPS Coordinator.  Information including medical coding and functional independence measure will be reviewed and updated through discharge.     Per nursing patient was given "Data Collection Information Summary for Patients in Inpatient Rehabilitation Facilities with attached "Privacy Act Statement-Health Care Records" upon admission.  

## 2013-12-14 NOTE — Care Management Note (Signed)
Inpatient Rehabilitation Center Individual Statement of Services  Patient Name:  Cheyenne Frank  Date:  12/14/2013  Welcome to the Inpatient Rehabilitation Center.  Our goal is to provide you with an individualized program based on your diagnosis and situation, designed to meet your specific needs.  With this comprehensive rehabilitation program, you will be expected to participate in at least 3 hours of rehabilitation therapies Monday-Friday, with modified therapy programming on the weekends.  Your rehabilitation program will include the following services:  Physical Therapy (PT), Occupational Therapy (OT), Speech Therapy (ST), 24 hour per day rehabilitation nursing, Case Management (Social Worker), Rehabilitation Medicine, Nutrition Services and Pharmacy Services  Weekly team conferences will be held on Wednesday to discuss your progress.  Your Social Worker will talk with you frequently to get your input and to update you on team discussions.  Team conferences with you and your family in attendance may also be held.  Expected length of stay: 10-14 days  Overall anticipated outcome: supervision/mod/i level  Depending on your progress and recovery, your program may change. Your Social Worker will coordinate services and will keep you informed of any changes. Your Social Worker's name and contact numbers are listed  below.  The following services may also be recommended but are not provided by the Inpatient Rehabilitation Center:   Driving Evaluations  Home Health Rehabiltiation Services  Outpatient Rehabilitation Services    Arrangements will be made to provide these services after discharge if needed.  Arrangements include referral to agencies that provide these services.  Your insurance has been verified to be: Medicare & UHC Your primary doctor is:  Dr Rolm GalaHeidi Grandis  Pertinent information will be shared with your doctor and your insurance company.  Social Worker:  Dossie DerBecky Manya Balash, SW  [phone] or ([phone]) [phone]  Information discussed with and copy given to patient by: Lucy Chrisupree, Zandon Talton G, 12/14/2013, 9:18 AM

## 2013-12-15 ENCOUNTER — Inpatient Hospital Stay (HOSPITAL_COMMUNITY): Payer: Medicare Other

## 2013-12-15 ENCOUNTER — Inpatient Hospital Stay (HOSPITAL_COMMUNITY): Payer: Medicare Other | Admitting: Physical Therapy

## 2013-12-15 DIAGNOSIS — E119 Type 2 diabetes mellitus without complications: Secondary | ICD-10-CM

## 2013-12-15 DIAGNOSIS — S065X9A Traumatic subdural hemorrhage with loss of consciousness of unspecified duration, initial encounter: Secondary | ICD-10-CM

## 2013-12-15 DIAGNOSIS — D509 Iron deficiency anemia, unspecified: Secondary | ICD-10-CM

## 2013-12-15 DIAGNOSIS — I6350 Cerebral infarction due to unspecified occlusion or stenosis of unspecified cerebral artery: Secondary | ICD-10-CM

## 2013-12-15 DIAGNOSIS — S065XAA Traumatic subdural hemorrhage with loss of consciousness status unknown, initial encounter: Secondary | ICD-10-CM

## 2013-12-15 LAB — GLUCOSE, CAPILLARY
GLUCOSE-CAPILLARY: 137 mg/dL — AB (ref 70–99)
GLUCOSE-CAPILLARY: 158 mg/dL — AB (ref 70–99)
Glucose-Capillary: 118 mg/dL — ABNORMAL HIGH (ref 70–99)
Glucose-Capillary: 126 mg/dL — ABNORMAL HIGH (ref 70–99)

## 2013-12-15 LAB — PATHOLOGIST SMEAR REVIEW

## 2013-12-15 NOTE — Progress Notes (Signed)
Occupational Therapy Session Note  Patient Details  Name: Cheyenne Frank MRN: [id number] Date of Birth: 07/08/1941  Today's Date: 12/15/2013 Time: 1100-1158 and 1332-1400 Time Calculation (min): 58 min and 28 min   Short Term Goals: Week 1:  OT Short Term Goal 1 (Week 1): Pt. will be supervison with LB dressing OT Short Term Goal 2 (Week 1): Pt. will be supervision with toilet transfers OT Short Term Goal 3 (Week 1): Pt. will be supervision with shower transfers  Skilled Therapeutic Interventions/Progress Updates:    Session 1: Pt seen for ADL retraining with focus on standing balance, use of AE, and adherence to precautions. Pt received sitting in w/c with daughter present. Ambulated to walk in shower with min assist using RW and min cues for hand placement during sit>stand. Completed bathing with increased time and min cues for adherence to precautions. Pt initiated use of LH sponge to wash bil feet. Pt locating all items on right side without cues. Completed dressing sitting in w/c. Therapist provided shorter reach for pt to use and increased success. Pt required min cues for technique with LB dressing while using AE. Completed sit<>stand during bathing and dressing 4x with min-supervision (1x) with mod cues for hand placement. Pt required rest breaks throughout session d/t fatigue and no report of dizziness throughout. At end of session pt left sitting in w/c with all needs in reach. Pt's daughter staying in room with pt so QRB left off. Daughter reported she will have someone assist with donning belt before she leaves.   Session 2: Pt seen for 1:1 OT session with focus on safety awareness, attention to right, and standing balance. Pt received sitting in w/c reporting being very tired however willing to participate in therapy session in ADL apartment. Discussed familiar recipe for pt to complete cooking task and ingredient required. Pt ambulated short distances with min-close supervision in kitchen  to look for ingredients required. Pt required min multimodal cues to scan to right to locate items in cabinets. Pt with min cues for safety during sit>stand for locking w/c brakes and hand placement. At end of session pt returned to room and transferred to EOB with min cues for locking w/c before standing. Pt doffed LB clothing and TLSO then completed sit>supine at supervision level. Pt left with all needs in reach.   Therapy Documentation Precautions:  Precautions Precautions: Fall;Back Precaution Comments: Back/Spinal precautions Required Braces or Orthoses: Spinal Brace Spinal Brace: Thoracolumbosacral orthotic;Applied in sitting position Restrictions Weight Bearing Restrictions: No General:   Vital Signs: Therapy Vitals Pulse Rate: 92 BP: 116/64 mmHg Patient Position, if appropriate: Standing (Ted hose on; asymptomatic) Oxygen Therapy SpO2: 97 % O2 Device: None (Room air) Pain: No report of pain during therapy sessions.   See FIM for current functional status  Therapy/Group: Individual Therapy  Daneil Danerkinson, Iniko Robles N 12/15/2013, 12:11 PM

## 2013-12-15 NOTE — Progress Notes (Signed)
[facility]IC[facility]ABILIT[facility]     PROGRESS NOTE    Subjective/Complaints: bp low again yesterday. Feeling well this morning. "hope my bp is higher today". Pain controlled  Objective: Vital Signs: Blood pressure 107/57, pulse 82, temperature 97.4 F (36.3 C), temperature source Oral, resp. rate 20, SpO2 96.00%. No results found.  Recent Labs  12/14/13 0645 12/14/13 1530  WBC 2.4* 3.1*  HGB 8.7* 9.5*  HCT 28.7* 31.0*  PLT 278 318    Recent Labs  12/14/13 0645  NA 143  K 4.5  CL 103  GLUCOSE 107*  BUN 18  CREATININE 0.75  CALCIUM 9.2   CBG (last 3)   Recent Labs  12/14/13 1639 12/14/13 2125 12/15/13 0734  GLUCAP 127* 143* 118*    Wt Readings from Last 3 Encounters:  04/06/13 104.327 kg (230 lb)  04/06/13 104.327 kg (230 lb)  03/31/13 104.327 kg (230 lb)    Physical Exam:  Constitutional: She is oriented to person, place, and time. She appears well-developed and well-nourished.  HENT:  Head: Normocephalic and atraumatic.  Right Ear: External ear normal.  Left Ear: External ear normal.  Eyes: Conjunctivae and EOM are normal. Pupils are equal, round, and reactive to light.  Neck: Normal range of motion. Neck supple. No JVD present. No tracheal deviation present. No thyromegaly present.  Cardiovascular: Normal rate and regular rhythm. Exam reveals no gallop and no friction rub.  No murmur heard.  Pulmonary/Chest: Effort normal and breath sounds normal. No respiratory distress. She has no wheezes. She has no rales. She exhibits no tenderness.  Abdominal: Soft. Bowel sounds are normal. She exhibits no distension. There is no tenderness. There is no rebound.  Midline incision with 3 cm superficially dehisced area. Long thoracolumbar incision healing well with minimal drainage on dressing (proximal aspect).  Musculoskeletal: She exhibits no edema and no tenderness.  Lymphadenopathy:  She has no cervical adenopathy.  Neurological: She is alert  and oriented to person, place, and time.  Flat affect with delayed responses. Able to ID simple objects with minimal delay. Has more difficulty with sentences and phrases but communicates fairly well with extra time. Speech clear. Able to follow basic commands without difficulty. Mild memory deficits. Right sided weakness with decrease in motor control. RUE 4/5 with decreased FMC, LUE 4+. RLE 3/5 HF to 4/5 ADF/APF. LLE 4- HF to 4+/5 distally. No gross sensory deficits. CN exam unremarkable.  Skin: Skin is warm and dry.  Stage 2 wound in hypogastric region with 1cm x 6cm area of pink granulation. Small, 1cm area of granulation around incision in back. Scar on left scalp well healed.  Psychiatric: Her behavior is normal.    Assessment/Plan: 1. 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  which require 3+ hours per day of interdisciplinary therapy in a comprehensive inpatient rehab setting. Physiatrist is providing close team supervision and 24 hour management of active medical problems listed below. Physiatrist and rehab team continue to assess barriers to discharge/monitor patient progress toward functional and medical goals. FIM: FIM - Bathing Bathing Steps Patient Completed: Chest;Right Arm;Left Arm;Abdomen;Right upper leg;Left upper leg;Left lower leg (including foot);Right lower leg (including foot);Front perineal area Bathing: 4: Min-Patient completes 8-9 57f 10 parts or 75+ percent  FIM - Upper Body Dressing/Undressing Upper body dressing/undressing steps patient completed: Thread/unthread right sleeve of pullover shirt/dresss;Thread/unthread left sleeve of pullover shirt/dress;Put head through opening of pull over shirt/dress;Pull shirt over trunk Upper body  dressing/undressing: 5: Set-up assist to: Obtain clothing/put away FIM - Lower Body Dressing/Undressing Lower body dressing/undressing steps patient completed: Pull  pants up/down;Pull underwear up/down;Thread/unthread left underwear leg Lower body dressing/undressing: 2: Max-Patient completed 25-49% of tasks  FIM - Hotel managerToileting Toileting Assistive Devices: Grab bar or rail for support;Toilet Aid/prosthesis/orthosis Toileting: 1: Total-Patient completed zero steps, helper did all 3  FIM - Diplomatic Services operational officerToilet Transfers Toilet Transfers Assistive Devices: Grab bars;Walker Toilet Transfers: 4-To toilet/BSC: Min A (steadying Pt. > 75%);4-From toilet/BSC: Min A (steadying Pt. > 75%)  FIM - Bed/Chair Transfer Bed/Chair Transfer Assistive Devices: Walker;Orthosis (TLSO) Bed/Chair Transfer: 4: Bed > Chair or W/C: Min A (steadying Pt. > 75%);4: Chair or W/C > Bed: Min A (steadying Pt. > 75%)  FIM - Locomotion: Wheelchair Locomotion: Wheelchair: 0: Activity did not occur FIM - Locomotion: Ambulation Locomotion: Ambulation Assistive Devices: Walker - Rolling;Orthosis;Other (comment) (TLSO) Ambulation/Gait Assistance: 4: Min guard Locomotion: Ambulation: 1: Travels less than 50 ft with minimal assistance (Pt.>75%)  Comprehension Comprehension Mode: Auditory Comprehension: 5-Follows basic conversation/direction: With extra time/assistive device  Expression Expression Mode: Verbal Expression: 5-Expresses complex 90% of the time/cues < 10% of the time  Social Interaction Social Interaction: 5-Interacts appropriately 90% of the time - Needs monitoring or encouragement for participation or interaction.  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 DVT Prophylaxis/Anticoagulation: SQ Lovenox.  2. Pain Management: prn tylenol for now.  3. Mood: Seems blunted. Provide ego support. LCSW to follow for evaluation and support.  4. Neuropsych:  This patient is capable of making decisions on her own behalf.  5. Lumbar laminectomy with post op I and D for infection: To [facility]  6. SDH s/p evacuation: On keppra bid for seizures/seizure prophylaxis.  7. DM type 2: Will monitor with ac/hs checks. Continue levemir at bedtime with SSI for elevated BS.   -reasonable control at present 8. OSA with hypercarbia: To continue CPAP.  9. Neutropenia: Question etiology. Diff pending.  10 Anemia of chronic disease/iron deficiency: Added iron supplement. hgb 8.7 11. CV- prinivil on hold. Would like to Continue coreg  -encourage fluids  TEDS.  LOS (Days) 4 A FACE TO FACE EVALUATION WAS PERFORMED  Jcion Buddenhagen T 12/15/2013 8:23 AM

## 2013-12-15 NOTE — Progress Notes (Signed)
Speech Language Pathology Daily Session Note  Patient Details  Name: Cheyenne Frank MRN: [id number] Date of Birth: 06/23/1941  Today's Date: 12/15/2013 Time: 1000-1045 Time Calculation (min): 45 min  Short Term Goals: Week 1: SLP Short Term Goal 1 (Week 1): Patient will utilize strategies to increase word finding ability at the phrase level with min A multimodal cueing.  SLP Short Term Goal 2 (Week 1): Pt will demonstrate basic problem solving and safety awareness to utilize call bell as needed with Min cues SLP Short Term Goal 3 (Week 1): Pt will utilize external memory aids to recall new anddaily information with supervision level cueing SLP Short Term Goal 4 (Week 1): Pt will verbally communicate basic wants/needs with supervision level cueing  Skilled Therapeutic Interventions: Treatment focused on cognitive-linguistic goals. SLP facilitated session with structured table task. Pt initially required Mod cues for recall of 3-step instructions, however pt utilized written external aid with supervision level cueing. Pt did not exhibit anomia throughout structured linguistic task, however required extra time and supervision level verbal cueing to compare/contrast items in pictures. Pt continues to exhibit mild anomia at the conversational level, and SLP provided education regarding word-finding strategies. Continue plan of care.   FIM:  Comprehension Comprehension Mode: Auditory Comprehension: 5-Follows basic conversation/direction: With extra time/assistive device Expression Expression Mode: Verbal Expression: 5-Expresses basic needs/ideas: With extra time/assistive device Social Interaction Social Interaction: 6-Interacts appropriately with others with medication or extra time (anti-anxiety, antidepressant). Problem Solving Problem Solving: 5-Solves basic 90% of the time/requires cueing < 10% of the time Memory Memory: 4-Recognizes or recalls 75 - 89% of the time/requires cueing 10 - 24%  of the time  Pain Pain Assessment Pain Assessment: No/denies pain  Therapy/Group: Individual Therapy   Maxcine HamLaura Paiewonsky, M.A. CCC-SLP [phone]  [name], Lizett Chowning 12/15/2013, 1:31 PM

## 2013-12-15 NOTE — Progress Notes (Addendum)
Physical Therapy Session Note  Patient Details  Name: Cheyenne Frank MRN: [id number] Date of Birth: 05/04/1941  Today's Date: 12/15/2013 Time: 0800-0900 Time Calculation (min): 60 min  Short Term Goals: Week 1:  PT Short Term Goal 1 (Week 1): Patient will be able to perform bed mobility with S/Mod-Independent assist. PT Short Term Goal 2 (Week 1): Patient will be able to perform transfers with S/Mod-Independent assist. PT Short Term Goal 3 (Week 1): Patient will be able to ambulate using LRAD x 180' with S/Mod-Independent assist.  Skilled Therapeutic Interventions/Progress Updates:    Pt received semi-reclined in bed with daughter, Arline AspCindy, present. Pt agreeable to session.  Assisted pt in donning Ted hose, pants while seated EOB. Orthostatic vitals not significant; see vital signs for detailed findings. Pt able to verbalize 3 of 3 spinal precautions with mod I using visual aid in room. Gait x41', x60' in controlled environment with min A, manual facilitation at L ribs, R axilla to correct R trunk lateral flexion. Negotiated 6 stairs with mod A; initial 3 steps forward-facing with bilat rails, final 3 steps with bilat UE support at L rail; cueing for technique, sequencing. Pt required seated rest breaks throughout session due to pt fatigue. Post-session, pt able to independently verbalize all spinal precautions without use of visual aid. Required cueing 3x during session to prevent spinal flexion. Session ended in pt room, where pt was left seated in w/c with daughter present and all needs within reach.   Addendum:  The following long term goals were modified: bed mobility at mod I, as pt requires increased time secondary to spinal precautions; gait distance in controlled environment secondary to limited activity tolerance; gait in home and community environments (assist level upgraded/distance downgraded) as primary caregiver has MS and therefore will be unable to provide mod A for household and  community ambulation.  Therapy Documentation Precautions:  Precautions Precautions: Fall;Back Precaution Comments: Back/Spinal precautions Required Braces or Orthoses: Spinal Brace Spinal Brace: Thoracolumbosacral orthotic;Applied in sitting position Restrictions Weight Bearing Restrictions: No Vital Signs: Therapy Vitals Temp: 97.4 F (36.3 C) Temp src: Oral Pulse Rate: 82 Resp: 20 BP: 107/57 mmHg Patient Position, if appropriate: Lying Oxygen Therapy SpO2: 96 % O2 Device: None (Room air) Pain: Pain Assessment Pain Score: 0-No pain   See FIM for current functional status  Therapy/Group: Individual Therapy  Calvert CantorHobble, Blair A 12/15/2013, 4:54 PM

## 2013-12-15 NOTE — Progress Notes (Signed)
Patient refusing to wear CPAP at this time.

## 2013-12-16 ENCOUNTER — Inpatient Hospital Stay (HOSPITAL_COMMUNITY): Payer: Medicare Other

## 2013-12-16 ENCOUNTER — Inpatient Hospital Stay (HOSPITAL_COMMUNITY): Payer: Medicare Other | Admitting: Physical Therapy

## 2013-12-16 ENCOUNTER — Encounter (HOSPITAL_COMMUNITY): Payer: Medicare Other

## 2013-12-16 DIAGNOSIS — S065X9A Traumatic subdural hemorrhage with loss of consciousness of unspecified duration, initial encounter: Secondary | ICD-10-CM

## 2013-12-16 DIAGNOSIS — E119 Type 2 diabetes mellitus without complications: Secondary | ICD-10-CM

## 2013-12-16 DIAGNOSIS — S065XAA Traumatic subdural hemorrhage with loss of consciousness status unknown, initial encounter: Secondary | ICD-10-CM

## 2013-12-16 DIAGNOSIS — D509 Iron deficiency anemia, unspecified: Secondary | ICD-10-CM

## 2013-12-16 DIAGNOSIS — I6350 Cerebral infarction due to unspecified occlusion or stenosis of unspecified cerebral artery: Secondary | ICD-10-CM

## 2013-12-16 LAB — GLUCOSE, CAPILLARY
GLUCOSE-CAPILLARY: 163 mg/dL — AB (ref 70–99)
Glucose-Capillary: 118 mg/dL — ABNORMAL HIGH (ref 70–99)
Glucose-Capillary: 144 mg/dL — ABNORMAL HIGH (ref 70–99)
Glucose-Capillary: 132 mg/dL — ABNORMAL HIGH (ref 70–99)

## 2013-12-16 MED ORDER — CIPROFLOXACIN HCL 250 MG PO TABS
250.0000 mg | ORAL_TABLET | Freq: Two times a day (BID) | ORAL | Status: AC
  Administered 2013-12-16 – 2013-12-22 (×14): 250 mg via ORAL
  Filled 2013-12-16 (×15): qty 1

## 2013-12-16 NOTE — Patient Care Conference (Signed)
Inpatient RehabilitationTeam Conference and Plan of Care Update Date: 12/16/2013   Time: 10;45 AM    Patient Name: Cheyenne Frank      Medical Record Number: [id number]  Date of Birth: 01/07/1941 Sex: Female         Room/Bed: 4M02C/4M02C-01 Payor Info: Payor: MEDICARE / Plan: MEDICARE PART A AND B / Product Type: *No Product type* /    Admitting Diagnosis: CVA  Admit Date/Time:  12/11/2013  2:30 PM Admission Comments: No comment available   Primary Diagnosis:  <principal problem not specified> Principal Problem: <principal problem not specified>  Patient Active Problem List   Diagnosis Date Noted  . SDH (subdural hematoma) 12/11/2013  . Postoperative wound infection--lumbar spine/2015 12/11/2013  . CVA (cerebral infarction) 12/11/2013  . Postoperative anemia due to acute blood loss 04/09/2013  . Diabetes 04/07/2013  . Hypertension 04/07/2013  . Unspecified sleep apnea 04/07/2013  . OA (osteoarthritis) of knee 04/06/2013    Expected Discharge Date: Expected Discharge Date: 12/24/13  Team Members Present: Physician leading conference: Dr. Faith RogueZachary Swartz Social Worker Present: Dossie DerBecky Jonah Nestle, LCSW Nurse Present: Gregor HamsMichele VonCannon, RN PT Present: Wanda Plumparoline Cook, PT;Blair [name], PT OT Present: Scherrie NovemberKayla Perkinson, Loistine ChanceT;Karen [place], OT SLP Present: Maxcine HamLaura Paiewonsky, SLP PPS Coordinator present : Tora DuckMarie Noel, RN, CRRN     Current Status/Progress Goal Weekly Team Focus  Medical   s/p thalamic cva/sdh after lumbar spine surgery. wounds better. making neuro prog  maximize functional activity toleranc  wound care, nutrition, dm control   Bowel/Bladder   cont of bowel, senna 1 tab BID, LBM 3/31; urgency incontinence of bladder, uses brief at night and BSC;   cont of bowel and bladder, urgency controlled  offer toileting to avoid urgency, monitor for constipation   Swallow/Nutrition/ Hydration     WFL        ADL's   min assist grooming, LB dressing, functional transfers, min-mod assist  toilet task, min assist bathing, setup assist UB dressing  supervision overall with min assist LB dressing and Mod I toilet transfer and task  adherence to precautions, sit<>stand, standing balance, UE strengthening, activity tolerance, pt/family education   Mobility   Min A with transfers and gait x50'; Mod A with stairs. Limited by activity tolerance.  Mod I overall, with exception of supervision with stairs  Adherence to spinal precautions with functional mobility, activity tolerance, functional transfers, gait, stair negotiation, initiation of hands-on family education   Communication   supervision-Min for verbal expression at the phrase level  Min  increase use of word-finding strategies   Safety/Cognition/ Behavioral Observations  supervision for use of external memory aids  supervision-Min  use of memory aids, basic problem solving   Pain   n/a, uses k-pad for back  remain pain free  assess pain qshift and prn   Skin   allevyn dsg to upper part of back incision, W to D dsg to abd incision healing   incision heals with no infection and no skin breakdown  assess skin qshift and prn, keep skin dry      *See Care Plan and progress notes for long and short-term goals.  Barriers to Discharge: safety, balance issues    Possible Resolutions to Barriers:  NMR, supervision at home, AET    Discharge Planning/Teaching Needs:  Home with daughter needs to be supervision level-due to daughter has MS.  Supportive family      Team Discussion:  Urgency with bladder, doing well.  Wounds look good, BS good.  Leans to left can't  self correct. Concerns family and pt.  Wants to be ready when goes home, since so long hospitalization.  Revisions to Treatment Plan:  Downgraded goals to supervision level-but now finding out intermittent assist   Continued Need for Acute Rehabilitation Level of Care: The patient requires daily medical management by a physician with specialized training in physical  medicine and rehabilitation for the following conditions: Daily direction of a multidisciplinary physical rehabilitation program to ensure safe treatment while eliciting the highest outcome that is of practical value to the patient.: Yes Daily medical management of patient stability for increased activity during participation in an intensive rehabilitation regime.: Yes Daily analysis of laboratory values and/or radiology reports with any subsequent need for medication adjustment of medical intervention for : Post surgical problems;Neurological problems;Other  [name], Lemar Livings 12/16/2013, 2:54 PM

## 2013-12-16 NOTE — Progress Notes (Signed)
Social Work Patient ID: Cheyenne PeeksGeneva Frank, female   DOB: 08/04/1941, 73 y.o.   MRN: [id number] Spoke with team and MD feel pt would be ready now on Thurs 4/9.  Both pt and daughter feel much more comfortable with this plan.  Want pt to be ready to go home, since It has been such a long hospitalization with all that has happened to her.  Will work with on discharge needs and encourage daughter's to ask questions and address concerns.

## 2013-12-16 NOTE — Progress Notes (Addendum)
Physical Therapy Session Note  Patient Details  Name: Cheyenne Frank MRN: [id number] Date of Birth: 03/11/1941  Today's Date: 12/16/2013 Time: [id number] Time Calculation (min): 44 min  Short Term Goals: Week 1:  PT Short Term Goal 1 (Week 1): Patient will be able to perform bed mobility with S/Mod-Independent assist. PT Short Term Goal 2 (Week 1): Patient will be able to perform transfers with S/Mod-Independent assist. PT Short Term Goal 3 (Week 1): Patient will be able to ambulate using LRAD x 180' with S/Mod-Independent assist.  Skilled Therapeutic Interventions/Progress Updates:    Pt received semi-reclined in bed; agreeable to session. Pt able to independently verbalize all spinal precautions without use of visual aid. Supine>sit with supervision, HOB elevated using bed rail. Seated EOB, donned TLSO, Ted hose, and shoes. Pt performed multiple sit<>stand transfers to/from EOB, standard chair with rolling walker requiring close supervision to min guard. Gait x97', x50', x 72' in controlled and home environments with rolling walker requiring min guard-min A; manual facilitation (counterforce) provided at L iliac crest, R ribcage to correct R lateral flexion of trunk. Initial 2 gait trials ended per pt request due to fatigue; final gait trial ended secondary to onset of significant L Trendelenburg gait pattern.   Static standing with mirror anterior to pt for visual feedback of postural abnormalities; pt with effective within-session carryover of awareness, self-correction of postural  Negotiated 6 steps with LUE support at L rail, forward-facing, step-to pattern with mod A to ascend, min A to descend with min verbal cueing for sequencing. Session ended in pt room, where pt was left seated in w/c with quick release belt on for safety, daughter [name](Lisa) present, and all needs within reach.    All long term goals downgraded to supervision, with exception of bed mobility, bed<>chair transfer, and gait in  controlled and home environments secondary to pt impulsivity and family report that pt will have intermittent supervision but will be at home without supervision for short periods of time.  Therapy Documentation Precautions:  Precautions Precautions: Fall;Back Precaution Comments: Back/Spinal precautions Required Braces or Orthoses: Spinal Brace Spinal Brace: Thoracolumbosacral orthotic;Applied in sitting position Restrictions Weight Bearing Restrictions: No Pain: Pain Assessment Pain Assessment: No/denies pain  See FIM for current functional status  Therapy/Group: Individual Therapy  [name], Lorenda IshiharaBlair A 12/16/2013, 1:23 PM

## 2013-12-16 NOTE — Progress Notes (Signed)
Discussed medical issues with daughter who is concerned that 12/19/13 discharge is too soon.  Patient with Pseudomonas UTI and started on cipro. Blood pressures stable. Ego support provided and updated patient and family on PT goals as well as progress. Relayed to them that she is at supervision to min guard assist and should progress to independent level with a few more days of therapy.  Will follow up on Friday.

## 2013-12-16 NOTE — Progress Notes (Signed)
Social Work Patient ID: Cheyenne Frank, female   DOB: 01/29/1941, 73 y.o.   MRN: [id number] Now team feels pt needs to stay until next Thurs due to intermittent assist to reach these goals. MD okay with this. Pt and daughter feels better about this plan.

## 2013-12-16 NOTE — Progress Notes (Signed)
Social Work Cheyenne Chris, LCSW Social Worker Signed  Patient Care Conference Service date: 12/16/2013 2:54 PM  Inpatient RehabilitationTeam Conference and Plan of Care Update Date: 12/16/2013   Time: 10;45 AM     Patient Name: Cheyenne Frank       Medical Record Number: [id number]   Date of Birth: 1941-04-28 Sex: Female         Room/Bed: 4M02C/4M02C-01 Payor Info: Payor: MEDICARE / Plan: MEDICARE PART A AND B / Product Type: *No Product type* /   Admitting Diagnosis: CVA   Admit Date/Time:  12/11/2013  2:30 PM Admission Comments: No comment available   Primary Diagnosis:  <principal problem not specified> Principal Problem: <principal problem not specified>    Patient Active Problem List     Diagnosis  Date Noted   .  SDH (subdural hematoma)  12/11/2013   .  Postoperative wound infection--lumbar spine/2015  12/11/2013   .  CVA (cerebral infarction)  12/11/2013   .  Postoperative anemia due to acute blood loss  04/09/2013   .  Diabetes  04/07/2013   .  Hypertension  04/07/2013   .  Unspecified sleep apnea  04/07/2013   .  OA (osteoarthritis) of knee  04/06/2013     Expected Discharge Date: Expected Discharge Date: 12/24/13  Team Members Present: Physician leading conference: Dr. Faith Rogue Social Worker Present: Cheyenne Der, LCSW Nurse Present: Cheyenne Hams, RN PT Present: Cheyenne Frank, PT;Cheyenne Frank, PT OT Present: Cheyenne Frank, Cheyenne Frank, OT SLP Present: Cheyenne Frank, SLP PPS Coordinator present : Cheyenne Duck, RN, CRRN        Current Status/Progress  Goal  Weekly Team Focus   Medical     s/p thalamic cva/sdh after lumbar spine surgery. wounds better. making neuro prog  maximize functional activity toleranc  wound care, nutrition, dm control   Bowel/Bladder     cont of bowel, senna 1 tab BID, LBM 3/31; urgency incontinence of bladder, uses brief at night and BSC;   cont of bowel and bladder, urgency controlled  offer toileting to avoid urgency,  monitor for constipation   Swallow/Nutrition/ Hydration     WFL       ADL's     min assist grooming, LB dressing, functional transfers, min-mod assist toilet task, min assist bathing, setup assist UB dressing  supervision overall with min assist LB dressing and Mod I toilet transfer and task  adherence to precautions, sit<>stand, standing balance, UE strengthening, activity tolerance, pt/family education   Mobility     Min A with transfers and gait x50'; Mod A with stairs. Limited by activity tolerance.  Mod I overall, with exception of supervision with stairs  Adherence to spinal precautions with functional mobility, activity tolerance, functional transfers, gait, stair negotiation, initiation of hands-on family education   Communication     supervision-Min for verbal expression at the phrase level  Min  increase use of word-finding strategies   Safety/Cognition/ Behavioral Observations    supervision for use of external memory aids  supervision-Min  use of memory aids, basic problem solving   Pain     n/a, uses k-pad for back  remain pain free  assess pain qshift and prn   Skin     allevyn dsg to upper part of back incision, W to D dsg to abd incision healing   incision heals with no infection and no skin breakdown  assess skin qshift and prn, keep skin dry     *See Care Plan and progress  notes for long and short-term goals.    Barriers to Discharge:  safety, balance issues      Possible Resolutions to Barriers:    NMR, supervision at home, AET      Discharge Planning/Teaching Needs:    Home with daughter needs to be supervision level-due to daughter has MS.  Supportive family      Team Discussion:    Urgency with bladder, doing well.  Wounds look good, BS good.  Leans to left can't self correct. Concerns family and pt.  Wants to be ready when goes home, since so long hospitalization.   Revisions to Treatment Plan:    Downgraded goals to supervision level-but now finding out  intermittent assist    Continued Need for Acute Rehabilitation Level of Care: The patient requires daily medical management by a physician with specialized training in physical medicine and rehabilitation for the following conditions: Daily direction of a multidisciplinary physical rehabilitation program to ensure safe treatment while eliciting the highest outcome that is of practical value to the patient.: Yes Daily medical management of patient stability for increased activity during participation in an intensive rehabilitation regime.: Yes Daily analysis of laboratory values and/or radiology reports with any subsequent need for medication adjustment of medical intervention for : Post surgical problems;Neurological problems;Other  Cheyenne Frank, Cheyenne Frank 12/16/2013, 2:54 PM          Patient ID: Cheyenne Frank, female   DOB: 09/23/1940, 73 y.o.   MRN: [id number]

## 2013-12-16 NOTE — Progress Notes (Signed)
Occupational Therapy Note  Patient Details  Name: Cheyenne Frank MRN: [id number] Date of Birth: 06/03/1941 Today's Date: 12/16/2013  Time: 1410-1441 Pt denies pain Individual Therapy  Pt resting in bed upon arrival but agreeable to participating in therapy.  Pt donned TLSO sitting EOB with supervision before ambulating with RW to w/c in room.  Pt engaged in dynamic standing tasks with RW and functional amb with RW in therapy gym.  Pt also engaged in retrieving items from floor using reacher.  Pt recalled 3/3 back precautions and adhered to precautions during functional tasks.  Pt amb with RW from therapy gym to 58M nursing station.  Pt returned to bed with alarm on and needs within reach.    Lavone NeriLanier, Jocelynn Gioffre [facility] Hospital[facility]appell 12/16/2013, 2:44 PM

## 2013-12-16 NOTE — Progress Notes (Signed)
Social Work Patient ID: Cheyenne Frank, female   DOB: 09/19/1940, 73 y.o.   MRN: [id number] Met with pt and daughter to inform of team conference goals and discharge 4/4.  Both feel this is too soon and she is still having medical issues. Will discuss with team and ask them to address pt and daughter's concerns.  Question if will actually reach supervision level goals by Sat.   Therapist's to discuss with family and get back with worker.

## 2013-12-16 NOTE — Progress Notes (Signed)
[place] PHYSICAL MEDICINE & REHABILITATION     PROGRESS NOTE    Subjective/Complaints: Had a good night. No new complaints. Denies sob, cough, dizziness A 12 point review of systems has been performed and if not noted above is otherwise negative.  Objective: Vital Signs: Blood pressure 102/66, pulse 79, temperature 97.8 F (36.6 C), temperature source Oral, resp. rate 17, SpO2 96.00%. No results found.  Recent Labs  12/14/13 0645 12/14/13 1530  WBC 2.4* 3.1*  HGB 8.7* 9.5*  HCT 28.7* 31.0*  PLT 278 318    Recent Labs  12/14/13 0645  NA 143  K 4.5  CL 103  GLUCOSE 107*  BUN 18  CREATININE 0.75  CALCIUM 9.2   CBG (last 3)   Recent Labs  12/15/13 1159 12/15/13 1649 12/15/13 2030  GLUCAP 137* 158* 126*    Wt Readings from Last 3 Encounters:  04/06/13 104.327 kg (230 lb)  04/06/13 104.327 kg (230 lb)  03/31/13 104.327 kg (230 lb)    Physical Exam:  Constitutional: She is oriented to person, place, and time. She appears well-developed and well-nourished.  HENT:  Head: Normocephalic and atraumatic.  Right Ear: External ear normal.  Left Ear: External ear normal.  Eyes: Conjunctivae and EOM are normal. Pupils are equal, round, and reactive to light.  Neck: Normal range of motion. Neck supple. No JVD present. No tracheal deviation present. No thyromegaly present.  Cardiovascular: Normal rate and regular rhythm. Exam reveals no gallop and no friction rub.  No murmur heard.  Pulmonary/Chest: Effort normal and breath sounds normal. No respiratory distress. She has no wheezes. She has no rales. She exhibits no tenderness.  Abdominal: Soft. Bowel sounds are normal. She exhibits no distension. There is no tenderness. There is no rebound.  Midline incision with 3 cm superficially dehisced area. Long thoracolumbar incision healing well with minimal drainage on dressing (proximal aspect).  Musculoskeletal: She exhibits no edema and no tenderness.  Lymphadenopathy:   She has no cervical adenopathy.  Neurological: She is alert and oriented to person, place, and time.  Flat affect with delayed responses. Able to ID simple objects with minimal delay. Has more difficulty with sentences and phrases but communicates fairly well with extra time. Speech clear. Able to follow basic commands without difficulty. Mild memory deficits. Right sided weakness with decrease in motor control. RUE 4/5 with decreased FMC, LUE 4+. RLE 3/5 HF to 4/5 ADF/APF. LLE 4- HF to 4+/5 distally. No gross sensory deficits. CN exam unremarkable.  Skin: Skin is warm and dry.  Stage 2 wound in hypogastric region with 1cm x 6cm area of pink granulation--has dried up essentially. Small, 1cm or less area of granulation around incision in back. Scar on left scalp well healed.  Psychiatric: Her behavior is normal.    Assessment/Plan: 1. 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  which require 3+ hours per day of interdisciplinary therapy in a comprehensive inpatient rehab setting. Physiatrist is providing close team supervision and 24 hour management of active medical problems listed below. Physiatrist and rehab team continue to assess barriers to discharge/monitor patient progress toward functional and medical goals. FIM: FIM - Bathing Bathing Steps Patient Completed: Chest;Right Arm;Left Arm;Abdomen;Right upper leg;Left upper leg;Left lower leg (including foot);Right lower leg (including foot);Front perineal area Bathing: 4: Min-Patient completes 8-9 61f 10 parts or 75+ percent  FIM - Upper Body Dressing/Undressing Upper body dressing/undressing steps patient completed: Thread/unthread right sleeve of pullover shirt/dresss;Thread/unthread  left sleeve of pullover shirt/dress;Put head through opening of pull over shirt/dress;Pull shirt over trunk Upper body dressing/undressing: 5: Set-up assist to: Obtain clothing/put away FIM -  Lower Body Dressing/Undressing Lower body dressing/undressing steps patient completed: Pull pants up/down;Pull underwear up/down;Thread/unthread left underwear leg;Thread/unthread right underwear leg;Thread/unthread right pants leg;Thread/unthread left pants leg Lower body dressing/undressing: 4: Min-Patient completed 75 plus % of tasks  FIM - Hotel managerToileting Toileting Assistive Devices: Grab bar or rail for support;Toilet Aid/prosthesis/orthosis Toileting: 1: Total-Patient completed zero steps, helper did all 3  FIM - Diplomatic Services operational officerToilet Transfers Toilet Transfers Assistive Devices: Grab bars;Walker Toilet Transfers: 4-To toilet/BSC: Min A (steadying Pt. > 75%);4-From toilet/BSC: Min A (steadying Pt. > 75%)  FIM - BankerBed/Chair Transfer Bed/Chair Transfer Assistive Devices: Walker;Orthosis;Bed rails;HOB elevated (TLSO) Bed/Chair Transfer: 4: Bed > Chair or W/C: Min A (steadying Pt. > 75%);4: Chair or W/C > Bed: Min A (steadying Pt. > 75%);5: Supine > Sit: Supervision (verbal cues/safety issues)  FIM - Locomotion: Wheelchair Locomotion: Wheelchair: 1: Total Assistance/staff pushes wheelchair (Pt<25%) FIM - Locomotion: Ambulation Locomotion: Ambulation Assistive Devices: Walker - Rolling;Orthosis;Other (comment) Ambulation/Gait Assistance: 4: Min assist Locomotion: Ambulation: 1: Travels less than 50 ft with minimal assistance (Pt.>75%)  Comprehension Comprehension Mode: Auditory Comprehension: 5-Follows basic conversation/direction: With extra time/assistive device  Expression Expression Mode: Verbal Expression: 5-Expresses complex 90% of the time/cues < 10% of the time  Social Interaction Social Interaction: 5-Interacts appropriately 90% of the time - Needs monitoring or encouragement for participation or interaction.  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 DVT Prophylaxis/Anticoagulation: SQ Lovenox.  2. Pain Management: prn tylenol for now.  3. Mood: Seems blunted. Provide ego support. LCSW to follow for evaluation and support.  4. Neuropsych: This patient is capable of making decisions on her own behalf.  5. Lumbar laminectomy with post op I and D for infection: To [facility]  6. SDH s/p evacuation: On keppra bid for seizures/seizure prophylaxis.  7. DM type 2: Will monitor with ac/hs checks. Continue levemir at bedtime with SSI for elevated BS.   -reasonable control at present 8. OSA with hypercarbia: To continue CPAP.  9. Neutropenia: Question etiology. Diff pending.  10 Anemia of chronic disease/iron deficiency: Added iron supplement. hgb 8.7 11. CV- bp's a little better   -prinivil still on hold.   to Continue coreg  -encourage fluids  TEDS.  LOS (Days) 5 A FACE TO FACE EVALUATION WAS PERFORMED  Flynn Lininger T 12/16/2013 7:58 AM

## 2013-12-16 NOTE — Progress Notes (Signed)
Occupational Therapy Session Note  Patient Details  Name: Cheyenne Frank MRN: [id number] Date of Birth: 03/27/1941  Today's Date: 12/16/2013 Time: 1120-1207 Time Calculation (min): 47 min  Short Term Goals: Week 1:  OT Short Term Goal 1 (Week 1): Pt. will be supervison with LB dressing OT Short Term Goal 2 (Week 1): Pt. will be supervision with toilet transfers OT Short Term Goal 3 (Week 1): Pt. will be supervision with shower transfers  Skilled Therapeutic Interventions/Progress Updates:    Pt seen for 1:1 OT session with focus on discharge planning, family training, standing balance, and functional transfers. Pt received sitting in w/c with sister Cheyenne Stanley(Lisa) present. Both expressing concerns about discharge date. Discussed concerns with family and provided reasoning for discharge date secondary to pt's current progress. Family informing that they will only be able to provide intermittent supervision rather then 24/7. Discussed with team and agreed it was safer to extend d/c and upgrade goals. Family and pt reported feeling "a little better" about discharge. Pt ambulated to sink to complete grooming care, requiring min cues for safety to lock w/c brakes and increased carryover of hand placement for sit<>stand independently. Completed grooming tasks in standing approx 5 min with min guard-supervision. Pt ambulated back to w/c requiring total assist for management of RW during turn change and min assist for balance. Completed education with daughter on toilet transfers and hands on training. Daughter demonstrated ability to safely assist with stand pivot transfer w/c<>toilet and toilet task as pt completed with min assist for standing balance. Daughter assisting too much with clothing management, requiring cues to allow pt to work at task. At end of session pt and daughter more comfortable with discharge. Pt had 1 occasion of right lean during ambulation in room secondary to fatigue. Discussed energy  conservation techniques.   Therapy Documentation Precautions:  Precautions Precautions: Fall;Back Precaution Comments: Back/Spinal precautions Required Braces or Orthoses: Spinal Brace Spinal Brace: Thoracolumbosacral orthotic;Applied in sitting position Restrictions Weight Bearing Restrictions: No General:   Vital Signs:   Pain: No report of pain during therapy session.   See FIM for current functional status  Therapy/Group: Individual Therapy  Daneil Danerkinson, Jeovanny Cuadros N 12/16/2013, 12:54 PM

## 2013-12-16 NOTE — Progress Notes (Signed)
Physical Therapy Note  Patient Details  Name: Cheyenne Frank MRN: [id number] Date of Birth: 07/12/1941 Today's Date: 12/16/2013  Time: 900-927 27 minutes  1:1 No c/o pain during session.  Gait in controlled environment with supervision, cues to tighten glutes on R to prevent trendelenberg gait, improves with repetition.  Stair training with 1 handrail on R to simulate home entry.  Pt able to negotiate 3 stairs x 5 attempts using B UEs on railing and ascending/descending sideways with step to pattern.  Attempted forward with 1 hand on R handrail, pt states she prefers sideways method.   Zaivion Kundrat 12/16/2013, 9:25 AM

## 2013-12-16 NOTE — Progress Notes (Addendum)
Speech Language Pathology Daily Session Note  Patient Details  Name: Cheyenne Frank MRN: [id number] Date of Birth: 07/18/1941  Today's Date: 12/16/2013 Time: 1015-1100 Time Calculation (min): 45 min  Short Term Goals: Week 1: SLP Short Term Goal 1 (Week 1): Patient will utilize strategies to increase word finding ability at the phrase level with min A multimodal cueing.  SLP Short Term Goal 2 (Week 1): Pt will demonstrate basic problem solving and safety awareness to utilize call bell as needed with Min cues SLP Short Term Goal 3 (Week 1): Pt will utilize external memory aids to recall new anddaily information with supervision level cueing SLP Short Term Goal 4 (Week 1): Pt will verbally communicate basic wants/needs with supervision level cueing  Skilled Therapeutic Interventions: Treatment focused on cognitive-linguistic goals, with daughter Cheyenne Frank present and engaged in therapy. SLP facilitated session with review of word-finding strategies with written aid provided. Pt completed linguistic activity in which she had to describe words on cards, with Min cues provided for use of word-finding strategies. She was able to guess words described to her with Mod I with extra time. Continue plan of care.   FIM:  Comprehension Comprehension Mode: Auditory Comprehension: 5-Follows basic conversation/direction: With extra time/assistive device Expression Expression Mode: Verbal Expression: 4-Expresses basic 75 - 89% of the time/requires cueing 10 - 24% of the time. Needs helper to occlude trach/needs to repeat words. Social Interaction Social Interaction: 5-Interacts appropriately 90% of the time - Needs monitoring or encouragement for participation or interaction. Problem Solving Problem Solving: 4-Solves basic 75 - 89% of the time/requires cueing 10 - 24% of the time Memory Memory: 5-Recognizes or recalls 90% of the time/requires cueing < 10% of the time  Pain Pain Assessment Pain Assessment:  No/denies pain  Therapy/Group: Individual Therapy   Maxcine HamLaura Paiewonsky, M.A. CCC-SLP [phone]  [name], Zanden Colver 12/16/2013, 1:11 PM

## 2013-12-17 ENCOUNTER — Inpatient Hospital Stay (HOSPITAL_COMMUNITY): Payer: Medicare Other | Admitting: Physical Therapy

## 2013-12-17 ENCOUNTER — Encounter (HOSPITAL_COMMUNITY): Payer: Medicare Other

## 2013-12-17 ENCOUNTER — Inpatient Hospital Stay (HOSPITAL_COMMUNITY): Payer: Medicare Other

## 2013-12-17 DIAGNOSIS — I6350 Cerebral infarction due to unspecified occlusion or stenosis of unspecified cerebral artery: Secondary | ICD-10-CM

## 2013-12-17 DIAGNOSIS — S065X9A Traumatic subdural hemorrhage with loss of consciousness of unspecified duration, initial encounter: Secondary | ICD-10-CM

## 2013-12-17 DIAGNOSIS — D509 Iron deficiency anemia, unspecified: Secondary | ICD-10-CM

## 2013-12-17 DIAGNOSIS — S065XAA Traumatic subdural hemorrhage with loss of consciousness status unknown, initial encounter: Secondary | ICD-10-CM

## 2013-12-17 DIAGNOSIS — E119 Type 2 diabetes mellitus without complications: Secondary | ICD-10-CM

## 2013-12-17 LAB — URINE CULTURE

## 2013-12-17 LAB — GLUCOSE, CAPILLARY
GLUCOSE-CAPILLARY: 120 mg/dL — AB (ref 70–99)
GLUCOSE-CAPILLARY: 157 mg/dL — AB (ref 70–99)
Glucose-Capillary: 126 mg/dL — ABNORMAL HIGH (ref 70–99)
Glucose-Capillary: 173 mg/dL — ABNORMAL HIGH (ref 70–99)

## 2013-12-17 NOTE — Progress Notes (Signed)
Occupational Therapy Session Note  Patient Details  Name: Cheyenne Frank MRN: [id number] Date of Birth: 09/23/1940  Today's Date: 12/17/2013 Time: 1003-1100 and [id number] Time Calculation (min): 57 min and 27 min   Short Term Goals: Week 1:  OT Short Term Goal 1 (Week 1): Pt. will be supervison with LB dressing OT Short Term Goal 2 (Week 1): Pt. will be supervision with toilet transfers OT Short Term Goal 3 (Week 1): Pt. will be supervision with shower transfers  Skilled Therapeutic Interventions/Progress Updates:    Session 1: Pt seen for ADL retraining with focus on safety awareness, functional transfers, and standing balance. Pt received sitting in w/c with daughter [name]([name]) present. Pt ambulated to bathroom with min guard assist using RW and manual facilitation for postural control as pt demonstrating lateral lean. Completed bathing with min verbal cues to use LH sponge to avoid breaking precautions. Pt completed dressing sitting in w/c and able to don undergarments and pants without AE and adhering to precautions. Pt required close supervision for standing balance during self-care tasks and mod cues for hand placement with sit>stand. [name] assisted with donning shoes as she was doing this task PTA.   Session 2: Pt seen for 1:1 OT session with focus on standing balance, safety awareness, and adherence to precautions during functional task. Pt received supine in bed and ready for therapy session. Completed supine>sit with min assist. Pt donned TLSO with setup assist then verbalized 3/3 precautions. Transferred to w/c with RW at supervision level. In ADL kitchen, practiced transporting plates and items refrigerator<>countertop and countertop<>table. Therapist allowed pt to trial transporting items without cues to assess problem solving. Pt with emergent awareness as she requested assistance. Therapist provided verbal instructions and demonstration of sliding items down counter and transferring them  from tablet to counter. Pt returned demonstration 2x initially requiring mod cues and progressing to min cues. Pt required verbal cue 1x to avoid twisting when reaching for item in refrigerator then practiced correct technique 2x. Pt required 2 rest breaks during session. Pt completed sit<>stand apprx 5 x throughout session with min cues for hand placement initially. At end of session pt returned to room and requested to remain sitting in w/c. QRB donned and pt left with all needs in reach. Therapist also provided walker bag at end of session to increase safety and ease with transporting items.   Therapy Documentation Precautions:  Precautions Precautions: Fall;Back Precaution Comments: Back/Spinal precautions Required Braces or Orthoses: Spinal Brace Spinal Brace: Thoracolumbosacral orthotic;Applied in sitting position Restrictions Weight Bearing Restrictions: No General:   Vital Signs:   Pain: Session 1: No report of pain during therapy session.  Session 2: Pt reporting 5/10 pain in back/hip and declined pain meds.   See FIM for current functional status  Therapy/Group: Individual Therapy  Daneil Danerkinson, Nikolaos Maddocks N 12/17/2013, 12:24 PM

## 2013-12-17 NOTE — Progress Notes (Signed)
[place] PHYSICAL MEDICINE & REHABILITATION     PROGRESS NOTE    Subjective/Complaints: Had a good night. No new complaints. Denies sob, cough, dizziness, no bowel problems, "I have bladder infection" A 12 point review of systems has been performed and if not noted above is otherwise negative.  Objective: Vital Signs: Blood pressure 141/62, pulse 86, temperature 98.3 F (36.8 C), temperature source Oral, resp. rate 18, weight 94.7 kg (208 lb 12.4 oz), SpO2 96.00%. No results found.  Recent Labs  12/14/13 0645 12/14/13 1530  WBC 2.4* 3.1*  HGB 8.7* 9.5*  HCT 28.7* 31.0*  PLT 278 318    Recent Labs  12/14/13 0645  NA 143  K 4.5  CL 103  GLUCOSE 107*  BUN 18  CREATININE 0.75  CALCIUM 9.2   CBG (last 3)   Recent Labs  12/16/13 1127 12/16/13 1646 12/16/13 2034  GLUCAP 144* 132* 163*    Wt Readings from Last 3 Encounters:  12/16/13 94.7 kg (208 lb 12.4 oz)  04/06/13 104.327 kg (230 lb)  04/06/13 104.327 kg (230 lb)    Physical Exam:  Constitutional: She is oriented to person, place, and time. She appears well-developed and well-nourished.  HENT:  Head: Normocephalic and atraumatic.  Right Ear: External ear normal.  Left Ear: External ear normal.  Eyes: Conjunctivae and EOM are normal. Pupils are equal, round, and reactive to light.  Neck: Normal range of motion. Neck supple. No JVD present. No tracheal deviation present. No thyromegaly present.  Cardiovascular: Normal rate and regular rhythm. Exam reveals no gallop and no friction rub.  No murmur heard.  Pulmonary/Chest: Effort normal and breath sounds normal. No respiratory distress. She has no wheezes. She has no rales. She exhibits no tenderness.  Abdominal: Soft. Bowel sounds are normal. She exhibits no distension. There is no tenderness. There is no rebound.   Musculoskeletal: She exhibits no edema and no tenderness.  Lymphadenopathy:  She has no cervical adenopathy.  Neurological: She is alert  and oriented to person, place, and time.  Flat affect with delayed responses. Able to ID simple objects with minimal delay. Has more difficulty with sentences and phrases but communicates fairly well with extra time. Speech clear. Able to follow basic commands without difficulty. Mild memory deficits. Right sided weakness with decrease in motor control. RUE 4/5 with decreased FMC, LUE 4+. RLE 3/5 HF to 4/5 ADF/APF. LLE 4- HF to 4+/5 distally. No gross sensory deficits. CN exam unremarkable.  Skin: Skin is warm and dry.   Psychiatric: Her behavior is normal.    Assessment/Plan: 1. 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  which require 3+ hours per day of interdisciplinary therapy in a comprehensive inpatient rehab setting. Physiatrist is providing close team supervision and 24 hour management of active medical problems listed below. Physiatrist and rehab team continue to assess barriers to discharge/monitor patient progress toward functional and medical goals. FIM: FIM - Bathing Bathing Steps Patient Completed: Chest;Right Arm;Left Arm;Abdomen;Right upper leg;Left upper leg;Left lower leg (including foot);Right lower leg (including foot);Front perineal area Bathing: 4: Min-Patient completes 8-9 8089f 10 parts or 75+ percent  FIM - Upper Body Dressing/Undressing Upper body dressing/undressing steps patient completed: Thread/unthread right sleeve of pullover shirt/dresss;Thread/unthread left sleeve of pullover shirt/dress;Put head through opening of pull over shirt/dress;Pull shirt over trunk Upper body dressing/undressing: 5: Set-up assist to: Obtain clothing/put away FIM - Lower Body Dressing/Undressing Lower body dressing/undressing steps patient completed: Pull pants  up/down;Pull underwear up/down;Thread/unthread left underwear leg;Thread/unthread right underwear leg;Thread/unthread right pants leg;Thread/unthread left pants  leg Lower body dressing/undressing: 4: Min-Patient completed 75 plus % of tasks  FIM - Toileting Toileting steps completed by patient: Adjust clothing prior to toileting;Performs perineal hygiene Toileting Assistive Devices: Grab bar or rail for support Toileting: 3: Mod-Patient completed 2 of 3 steps  FIM - Diplomatic Services operational officer Devices: Grab bars;Walker Toilet Transfers: 4-To toilet/BSC: Min A (steadying Pt. > 75%);4-From toilet/BSC: Min A (steadying Pt. > 75%)  FIM - Banker Devices: Walker;Orthosis;Bed rails;HOB elevated (TLSO) Bed/Chair Transfer: 4: Bed > Chair or W/C: Min A (steadying Pt. > 75%);4: Chair or W/C > Bed: Min A (steadying Pt. > 75%);5: Supine > Sit: Supervision (verbal cues/safety issues)  FIM - Locomotion: Wheelchair Locomotion: Wheelchair: 1: Total Assistance/staff pushes wheelchair (Pt<25%) FIM - Locomotion: Ambulation Locomotion: Ambulation Assistive Devices: Walker - Rolling;Orthosis;Other (comment) (TLSO) Ambulation/Gait Assistance: 4: Min assist Locomotion: Ambulation: 2: Travels 50 - 149 ft with minimal assistance (Pt.>75%)  Comprehension Comprehension Mode: Auditory Comprehension: 5-Follows basic conversation/direction: With extra time/assistive device  Expression Expression Mode: Verbal Expression: 5-Expresses complex 90% of the time/cues < 10% of the time  Social Interaction Social Interaction: 5-Interacts appropriately 90% of the time - Needs monitoring or encouragement for participation or interaction.  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 DVT Prophylaxis/Anticoagulation: SQ Lovenox.  2. Pain Management: prn tylenol for now.  3. Mood: Seems  blunted. Provide ego support. LCSW to follow for evaluation and support.  4. Neuropsych: This patient is capable of making decisions on her own behalf.  5. Lumbar laminectomy with post op I and D for infection: To continues on keflex for a year per NS at [facility].  6. SDH s/p evacuation: On keppra bid for seizures/seizure prophylaxis.  7. DM type 2: Will monitor with ac/hs checks. Continue levemir at bedtime with SSI for elevated BS.   -reasonable control at present 8. OSA with hypercarbia: To continue CPAP.  9. Neutropenia: Question etiology. Diff pending.  10 Anemia of chronic disease/iron deficiency: Added iron supplement. hgb 8.7 11. CV- bp's a little better   -prinivil still on hold.   to Continue coreg  -encourage fluids  TEDS.  LOS (Days) 6 A FACE TO FACE EVALUATION WAS PERFORMED  Erick Colace 12/17/2013 6:31 AM

## 2013-12-17 NOTE — Progress Notes (Signed)
RT spoke with patient about wearing the CPAP for the night.  Patient stated that she didn't want to wear the CPAP tonight but not to remove from the room due to she may change her mind.  RT will continue to monitor.

## 2013-12-17 NOTE — Progress Notes (Signed)
Physical Therapy Session Note  Patient Details  Name: Cheyenne Frank MRN: [id number] Date of Birth: 11/23/1940  Today's Date: 12/17/2013 Time: 0802-0902 Time Calculation (min): 60 min  Short Term Goals: Week 1:  PT Short Term Goal 1 (Week 1): Patient will be able to perform bed mobility with S/Mod-Independent assist. PT Short Term Goal 2 (Week 1): Patient will be able to perform transfers with S/Mod-Independent assist. PT Short Term Goal 3 (Week 1): Patient will be able to ambulate using LRAD x 180' with S/Mod-Independent assist.  Skilled Therapeutic Interventions/Progress Updates:    Pt received supine in bed; asleep but easily awakened. Performed supine>sit with HOB flat using rail requiring mod A to lift trunk. Seated EOB, donned TLSO, pants, Ted hose, and shoes. Performed toilet transfer with min A using rolling walker. Pt incontinent of bladder; RN notified. Performed gait x75' in controlled environment, x50' in home environment with min A, multimodal cueing to prevent R hip drop during LLE stance phase of gait. Performed L sidestepping x20' with bilat UE support at hallway hand rail for L gluteus medius strengthening. Seated on mat table, performed bilat gluteus medius stretching x2 minutes per side to address pt-reported soreness in muscles. Returned to room, where pt was left seated in w/c with quick release belt on for safety and all needs within reach.   Therapy Documentation Precautions:  Precautions Precautions: Fall;Back Precaution Comments: Back/Spinal precautions Required Braces or Orthoses: Spinal Brace Spinal Brace: Thoracolumbosacral orthotic;Applied in sitting position Restrictions Weight Bearing Restrictions: No Pain: Pain Assessment Pain Assessment: No/denies pain Locomotion : Ambulation Ambulation/Gait Assistance: 4: Min assist   See FIM for current functional status  Therapy/Group: Individual Therapy  [name], Lorenda IshiharaBlair A 12/17/2013, 12:50 PM

## 2013-12-17 NOTE — Progress Notes (Signed)
Speech Language Pathology Daily Session Note  Patient Details  Name: Cheyenne Frank MRN: [id number] Date of Birth: 08/26/1941  Today's Date: 12/17/2013 Time: 0915-1000 Time Calculation (min): 45 min  Short Term Goals: Week 1: SLP Short Term Goal 1 (Week 1): Patient will utilize strategies to increase word finding ability at the phrase level with min A multimodal cueing.  SLP Short Term Goal 2 (Week 1): Pt will demonstrate basic problem solving and safety awareness to utilize call bell as needed with Min cues SLP Short Term Goal 3 (Week 1): Pt will utilize external memory aids to recall new anddaily information with supervision level cueing SLP Short Term Goal 4 (Week 1): Pt will verbally communicate basic wants/needs with supervision level cueing  Skilled Therapeutic Interventions: Treatment focused on cognitive-linguistic goals. SLP facilitated session with structured table task. Pt required supervision level cueing for adequate reasoning and problem solving during categorical exclusion task. Pt did require Mod cueing to verbally express rationale behind her reasoning. Pt demonstrated adequate emergent awareness without cues. Continue plan of care.   FIM:  Comprehension Comprehension Mode: Auditory Comprehension: 5-Follows basic conversation/direction: With extra time/assistive device Expression Expression Mode: Verbal Expression: 5-Expresses basic needs/ideas: With no assist Social Interaction Social Interaction: 5-Interacts appropriately 90% of the time - Needs monitoring or encouragement for participation or interaction. Problem Solving Problem Solving: 5-Solves basic 90% of the time/requires cueing < 10% of the time Memory Memory: 5-Recognizes or recalls 90% of the time/requires cueing < 10% of the time  Pain Pain Assessment Pain Assessment: No/denies pain  Therapy/Group: Individual Therapy   Maxcine HamLaura Frank, M.A. CCC-SLP [phone]  [name], Cheyenne Frank 12/17/2013, 12:39  PM

## 2013-12-18 ENCOUNTER — Inpatient Hospital Stay (HOSPITAL_COMMUNITY): Payer: Medicare Other

## 2013-12-18 ENCOUNTER — Inpatient Hospital Stay (HOSPITAL_COMMUNITY): Payer: Medicare Other | Admitting: Physical Therapy

## 2013-12-18 ENCOUNTER — Inpatient Hospital Stay (HOSPITAL_COMMUNITY): Payer: Medicare Other | Admitting: Occupational Therapy

## 2013-12-18 LAB — CBC WITH DIFFERENTIAL/PLATELET
BASOS PCT: 0 % (ref 0–1)
Basophils Absolute: 0 10*3/uL (ref 0.0–0.1)
EOS ABS: 0.1 10*3/uL (ref 0.0–0.7)
EOS PCT: 2 % (ref 0–5)
HCT: 41.3 % (ref 36.0–46.0)
HEMOGLOBIN: 13.1 g/dL (ref 12.0–15.0)
Lymphocytes Relative: 20 % (ref 12–46)
Lymphs Abs: 1.1 10*3/uL (ref 0.7–4.0)
MCH: 31.1 pg (ref 26.0–34.0)
MCHC: 31.7 g/dL (ref 30.0–36.0)
MCV: 98.1 fL (ref 78.0–100.0)
MONOS PCT: 17 % — AB (ref 3–12)
Monocytes Absolute: 0.9 10*3/uL (ref 0.1–1.0)
NEUTROS PCT: 61 % (ref 43–77)
Neutro Abs: 3.3 10*3/uL (ref 1.7–7.7)
Platelets: 148 10*3/uL — ABNORMAL LOW (ref 150–400)
RBC: 4.21 MIL/uL (ref 3.87–5.11)
RDW: 14.3 % (ref 11.5–15.5)
WBC: 5.4 10*3/uL (ref 4.0–10.5)

## 2013-12-18 LAB — GLUCOSE, CAPILLARY
Glucose-Capillary: 114 mg/dL — ABNORMAL HIGH (ref 70–99)
Glucose-Capillary: 129 mg/dL — ABNORMAL HIGH (ref 70–99)
Glucose-Capillary: 108 mg/dL — ABNORMAL HIGH (ref 70–99)
Glucose-Capillary: 190 mg/dL — ABNORMAL HIGH (ref 70–99)

## 2013-12-18 LAB — LEVETIRACETAM LEVEL: Levetiracetam Lvl: 88.4 ug/mL

## 2013-12-18 NOTE — Progress Notes (Signed)
Occupational Therapy Session Note  Patient Details  Name: Cheyenne Frank MRN: [id number] Date of Birth: 1941/01/17  Today's Date: 12/18/2013 Time: 9914-4458 Time Calculation (min): 55 min  Short Term Goals: Week 1:  OT Short Term Goal 1 (Week 1): Pt. will be supervison with LB dressing OT Short Term Goal 2 (Week 1): Pt. will be supervision with toilet transfers OT Short Term Goal 3 (Week 1): Pt. will be supervision with shower transfers  Skilled Therapeutic Interventions/Progress Updates:    Engaged in ADL retraining with focus on education regarding AE for LB bathing and dressing.  Pt reports already dressed and not interested in completing bathing and dressing, however pt with questions regarding donning pants, socks and shoes.  Introduced sock aid, reacher, and long handled shoe horn as well as issued pt elastic shoe laces.  Pt able to return demonstrate use of sock aid to don socks and required assist initially with donning shoes with cues to utilize reacher to adjust shoe tongue prior to donning shoe.  Discussed AE kit for purchase if needed, however pt reports she may have all pieces of kit with exception of long handled sponge.  Daughter plans to look for items at home.  Therapy Documentation Precautions:  Precautions Precautions: Fall;Back Precaution Comments: Back/Spinal precautions Required Braces or Orthoses: Spinal Brace Spinal Brace: Thoracolumbosacral orthotic;Applied in sitting position Restrictions Weight Bearing Restrictions: No Pain:  Pt c/o pain in Lt knee, reports potentially overuse with PT.  See FIM for current functional status  Therapy/Group: Individual Therapy  Simonne Come 12/18/2013, 12:13 PM

## 2013-12-18 NOTE — Progress Notes (Signed)
[facility] PHYSICAL MEDICINE & REHABILITATION     PROGRESS NOTE    Subjective/Complaints: No back or leg pain A 12 point review of systems has been performed and if not noted above is otherwise negative.  Objective: Vital Signs: Blood pressure 121/59, pulse 67, temperature 97.9 F (36.6 C), temperature source Oral, resp. rate 18, weight 94.7 kg (208 lb 12.4 oz), SpO2 97.00%. No results found.  Recent Labs  12/18/13 0618  WBC 5.4  HGB 13.1  HCT 41.3  PLT 148*   No results found for this basename: NA, K, CL, CO, GLUCOSE, BUN, CREATININE, CALCIUM,  in the last 72 hours CBG (last 3)   Recent Labs  12/17/13 1626 12/17/13 2040 12/18/13 0730  GLUCAP 126* 173* 114*    Wt Readings from Last 3 Encounters:  12/16/13 94.7 kg (208 lb 12.4 oz)  04/06/13 104.327 kg (230 lb)  04/06/13 104.327 kg (230 lb)    Physical Exam:  Constitutional: She is oriented to person, place, and time. She appears well-developed and well-nourished.  HENT:  Head: Normocephalic and atraumatic.  Right Ear: External ear normal.  Left Ear: External ear normal.  Eyes: Conjunctivae and EOM are normal. Pupils are equal, round, and reactive to light.  Neck: Normal range of motion. Neck supple. No JVD present. No tracheal deviation present. No thyromegaly present.  Cardiovascular: Normal rate and regular rhythm. Exam reveals no gallop and no friction rub.  No murmur heard.  Pulmonary/Chest: Effort normal and breath sounds normal. No respiratory distress. She has no wheezes. She has no rales. She exhibits no tenderness.  Abdominal: Soft. Bowel sounds are normal. She exhibits no distension. There is no tenderness. There is no rebound.   Musculoskeletal: She exhibits no edema and no tenderness.  Lymphadenopathy:  She has no cervical adenopathy.  Neurological: She is alert and oriented to person, place, and time.  Flat affect with delayed responses. Marland Kitchen.Speech clear. Able to follow basic commands without  difficulty. Mild memory deficits. Right sided weakness with decrease in motor control. RUE 4/5 with decreased FMC, LUE 4+. RLE 3/5 HF to 4/5 ADF/APF. LLE 4- HF to 4+/5 distally. No gross sensory deficits. CN exam unremarkable.  Skin: Skin is warm and dry.   Psychiatric: Her behavior is normal, except as above.    Assessment/Plan: 1. 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  which require 3+ hours per day of interdisciplinary therapy in a comprehensive inpatient rehab setting. Physiatrist is providing close team supervision and 24 hour management of active medical problems listed below. Physiatrist and rehab team continue to assess barriers to discharge/monitor patient progress toward functional and medical goals. FIM: FIM - Bathing Bathing Steps Patient Completed: Chest;Right Arm;Left Arm;Abdomen;Right upper leg;Left upper leg;Left lower leg (including foot);Right lower leg (including foot);Front perineal area Bathing: 4: Min-Patient completes 8-9 [id number] 10 parts or 75+ percent  FIM - Upper Body Dressing/Undressing Upper body dressing/undressing steps patient completed: Thread/unthread right sleeve of pullover shirt/dresss;Thread/unthread left sleeve of pullover shirt/dress;Put head through opening of pull over shirt/dress;Pull shirt over trunk Upper body dressing/undressing: 5: Set-up assist to: Apply TLSO, cervical collar FIM - Lower Body Dressing/Undressing Lower body dressing/undressing steps patient completed: Pull pants up/down;Pull underwear up/down;Thread/unthread left underwear leg;Thread/unthread right underwear leg;Thread/unthread right pants leg;Thread/unthread left pants leg Lower body dressing/undressing: 4: Min-Patient completed 75 plus % of tasks  FIM - Toileting Toileting steps completed by patient: Adjust clothing prior to toileting;Performs perineal hygiene Toileting Assistive Devices: Grab  bar or rail for  support Toileting: 3: Mod-Patient completed 2 of 3 steps  FIM - Diplomatic Services operational officer Devices: Grab bars;Walker Toilet Transfers: 4-To toilet/BSC: Min A (steadying Pt. > 75%);4-From toilet/BSC: Min A (steadying Pt. > 75%)  FIM - Bed/Chair Transfer Bed/Chair Transfer Assistive Devices: Bed rails;Orthosis;Walker (TLSO) Bed/Chair Transfer: 3: Supine > Sit: Mod A (lifting assist/Pt. 50-74%/lift 2 legs;4: Bed > Chair or W/C: Min A (steadying Pt. > 75%);4: Chair or W/C > Bed: Min A (steadying Pt. > 75%)  FIM - Locomotion: Wheelchair Locomotion: Wheelchair: 1: Total Assistance/staff pushes wheelchair (Pt<25%) FIM - Locomotion: Ambulation Locomotion: Ambulation Assistive Devices: Walker - Rolling;Orthosis;Other (comment) (TLSO) Ambulation/Gait Assistance: 4: Min assist Locomotion: Ambulation: 2: Travels 50 - 149 ft with minimal assistance (Pt.>75%)  Comprehension Comprehension Mode: Auditory Comprehension: 5-Follows basic conversation/direction: With extra time/assistive device  Expression Expression Mode: Verbal Expression: 5-Expresses basic needs/ideas: With no assist  Social Interaction Social Interaction: 5-Interacts appropriately 90% of the time - Needs monitoring or encouragement for participation or interaction.  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 DVT Prophylaxis/Anticoagulation: SQ Lovenox.  2. Pain Management: prn tylenol for now.  3. Mood: Seems blunted. Provide ego support. LCSW to follow for evaluation and support.  4. Neuropsych: This patient is capable of making decisions on her own behalf.  5. Lumbar laminectomy with post op I and D for infection: To continues on keflex for a year per NS at [facility].   6. SDH s/p evacuation: On keppra bid for seizures/seizure prophylaxis.  7. DM type 2: Will monitor with ac/hs checks. Continue levemir at bedtime with SSI for elevated BS.   -reasonable control at present 8. OSA with hypercarbia: To continue CPAP.  9. Neutropenia: Question etiology. Diff pending.  10 Anemia of chronic disease/iron deficiency: Added iron supplement. hgb 8.7 11. CV- bp's a little better   -prinivil still on hold.   to Continue coreg  -encourage fluids  TEDS.  LOS (Days) 7 A FACE TO FACE EVALUATION WAS PERFORMED  Erick Colace 12/18/2013 7:37 AM

## 2013-12-18 NOTE — Progress Notes (Signed)
Physical Therapy Session Note  Patient Details  Name: Cheyenne Frank MRN: [id number] Date of Birth: 02/23/1941  Today's Date: 12/18/2013 Time: [id number] and [id number] Time Calculation (min): 56 min and 28 min  Short Term Goals: Week 1:  PT Short Term Goal 1 (Week 1): Patient will be able to perform bed mobility with S/Mod-Independent assist. PT Short Term Goal 2 (Week 1): Patient will be able to perform transfers with S/Mod-Independent assist. PT Short Term Goal 3 (Week 1): Patient will be able to ambulate using LRAD x 180' with S/Mod-Independent assist.  Skilled Therapeutic Interventions/Progress Updates:    Treatment Session 1: Pt received seated EOB with daughter and RN present. Session focused on increasing pt independence with supine<>sit transfer with HOB flat, no rails to simulate home setup. Gait x75' in controlled environment with min guard. Performed multiple trials of supine<>sit with graded elevation of HOB beginning at 30 degrees, then progressing to 15 degrees. Initially, pt required min Frank to perform supine>sit with HOB at 30 degrees, no rails. Final supine>sit (on L side of bed) with supervision, HOB flat, no rails; increased time and min cueing required for technique. Transported pt in w/c to room, where session ended. Therapist departed with pt seated in w/c with daughter present and all needs within reach.  Treatment Session 2: Pt received seated in w/c; agreeable to session. Pt able to verbalize 3 of 3 spinal precautions without cueing or use of visual aid. Pt required no cueing throughout session to adhere to precautions. Pt reports minor "soreness" in anteromedial aspect of L knee (inferior to joint line) which was tender to palpation. Pain reproduced with MMT of L sartorius muscle. Applied ice and ACE bandage for compression x15 minutes. Post-session, pt reports no pain in L knee.   Session focused on increasing pt independence with gait and supine<>sit transfers. In seated, pt  donned TLSO with min Frank. Performed multiple sit<>stand transfers with supervision, intermittent min verbal cueing for hand placement and safety awareness. Performed gait x125' total in controlled and home environments with rolling walker and close supervision. Onset of L Trendelenburg gait pattern noted during final 60' of gait trial. In rehab apartment, performed supine<>sit (on R side of bed)  on apartment bed with supervision, increased time; pt with effective between-session carryover of cueing for technique. Session ended in pt room, where pt was left seated in w/c with quick release belt on fore safety and all needs within reach.  Therapy Documentation Precautions:  Precautions Precautions: Fall;Back Precaution Comments: Back/Spinal precautions Required Braces or Orthoses: Spinal Brace Spinal Brace: Thoracolumbosacral orthotic;Applied in sitting position Restrictions Weight Bearing Restrictions: No Vital Signs: Therapy Vitals Temp: 97.9 F (36.6 C) Temp src: Oral Pulse Rate: 67 Resp: 18 BP: 121/59 mmHg Patient Position, if appropriate: Lying Oxygen Therapy SpO2: 97 % Pain: Pain Assessment Pain Assessment: No/denies pain Pain Score: 0-No pain Locomotion : Ambulation Ambulation/Gait Assistance: 4: Min guard   See FIM for current functional status  Therapy/Group: Individual Therapy  Cheyenne Frank, Cheyenne Frank 12/18/2013, 8:59 AM

## 2013-12-18 NOTE — Progress Notes (Signed)
Social Work Patient ID: Cheyenne Frank, female   DOB: 1940/10/03, 73 y.o.   MRN: [id number] Met with pt and daughter to discuss discharge needs.  Pt will need PT, OT and SPT follow up.  Daughter wants to check with [place] therapist regarding Who they recommend [place] or [place] for pt's outpatient therapies.  Made aware this worker will be on vacation next week and another social worker will be Covering.  Much happier with discharge date and eel pt is doing better.

## 2013-12-18 NOTE — Progress Notes (Signed)
Patient doesn't want to wear CPAP at the present time. Patient states that she may change her mind. RT will continue to monitor.

## 2013-12-18 NOTE — Progress Notes (Signed)
Speech Language Pathology Daily Session Note  Patient Details  Name: Cheyenne Frank MRN: [id number] Date of Birth: 06/27/1941  Today's Date: 12/18/2013 Time: [id number] Time Calculation (min): 45 min  Short Term Goals: Week 1: SLP Short Term Goal 1 (Week 1): Patient will utilize strategies to increase word finding ability at the phrase level with min A multimodal cueing.  SLP Short Term Goal 2 (Week 1): Pt will demonstrate basic problem solving and safety awareness to utilize call bell as needed with Min cues SLP Short Term Goal 3 (Week 1): Pt will utilize external memory aids to recall new anddaily information with supervision level cueing SLP Short Term Goal 4 (Week 1): Pt will verbally communicate basic wants/needs with supervision level cueing  Skilled Therapeutic Interventions: Treatment focused on cognitive-linguistic goals. SLP facilitated session with Max cues for recall of word-finding strategies without external aid. SLP provided education regarding memory strategies, including external memory aid. Pt practiced using repetition, and was then able to recall 2 out of 4 strategies independently. Pt utilized list as an external aid during categorical naming task with supervision. She generated words, using word-finding strategies with Min cues. Continue plan of care.   FIM:  Comprehension Comprehension Mode: Auditory Comprehension: 5-Follows basic conversation/direction: With extra time/assistive device Expression Expression Mode: Verbal Expression: 5-Expresses basic needs/ideas: With extra time/assistive device Social Interaction Social Interaction: 5-Interacts appropriately 90% of the time - Needs monitoring or encouragement for participation or interaction. Problem Solving Problem Solving: 5-Solves basic 90% of the time/requires cueing < 10% of the time Memory Memory: 3-Recognizes or recalls 50 - 74% of the time/requires cueing 25 - 49% of the time  Pain Pain Assessment Pain  Assessment: No/denies pain  Therapy/Group: Individual Therapy   Cheyenne Frank, Cheyenne Frank [phone]  Cheyenne Hamaiewonsky, Vearl Allbaugh 12/18/2013, 12:34 PM

## 2013-12-19 ENCOUNTER — Inpatient Hospital Stay (HOSPITAL_COMMUNITY): Payer: Medicare Other | Admitting: Speech Pathology

## 2013-12-19 LAB — GLUCOSE, CAPILLARY
GLUCOSE-CAPILLARY: 118 mg/dL — AB (ref 70–99)
Glucose-Capillary: 145 mg/dL — ABNORMAL HIGH (ref 70–99)
Glucose-Capillary: 120 mg/dL — ABNORMAL HIGH (ref 70–99)
Glucose-Capillary: 185 mg/dL — ABNORMAL HIGH (ref 70–99)

## 2013-12-19 NOTE — Progress Notes (Signed)
[facility] PHYSICAL MEDICINE & REHABILITATION     PROGRESS NOTE    Subjective/Complaints: Slept well no issues in therapy A 12 point review of systems has been performed and if not noted above is otherwise negative.  Objective: Vital Signs: Blood pressure 120/57, pulse 85, temperature 97.9 F (36.6 C), temperature source Oral, resp. rate 21, weight 94.7 kg (208 lb 12.4 oz), SpO2 94.00%. No results found.  Recent Labs  12/18/13 0618  WBC 5.4  HGB 13.1  HCT 41.3  PLT 148*   No results found for this basename: NA, K, CL, CO, GLUCOSE, BUN, CREATININE, CALCIUM,  in the last 72 hours CBG (last 3)   Recent Labs  12/18/13 1133 12/18/13 1654 12/18/13 2054  GLUCAP 129* 108* 190*    Wt Readings from Last 3 Encounters:  12/16/13 94.7 kg (208 lb 12.4 oz)  04/06/13 104.327 kg (230 lb)  04/06/13 104.327 kg (230 lb)    Physical Exam:  Constitutional: She is oriented to person, place, and time. She appears well-developed and well-nourished.  HENT:  Head: Normocephalic and atraumatic.  Right Ear: External ear normal.  Left Ear: External ear normal.  Eyes: Conjunctivae and EOM are normal. Pupils are equal, round, and reactive to light.  Neck: Normal range of motion. Neck supple. No JVD present. No tracheal deviation present. No thyromegaly present.  Cardiovascular: Normal rate and regular rhythm. Exam reveals no gallop and no friction rub.  No murmur heard.  Pulmonary/Chest: Effort normal and breath sounds normal. No respiratory distress. She has no wheezes. She has no rales. She exhibits no tenderness.  Abdominal: Soft. Bowel sounds are normal. She exhibits no distension. There is no tenderness. There is no rebound.   Musculoskeletal: She exhibits no edema and no tenderness.  Lymphadenopathy:  She has no cervical adenopathy.  Neurological: She is alert and oriented to person, place, and time.  Flat affect with delayed responses. Marland Kitchen.Speech clear. Able to follow basic commands  without difficulty. Mild memory deficits. Right sided weakness with decrease in motor control. RUE 4/5 with decreased FMC, LUE 4+. RLE 3/5 HF to 4/5 ADF/APF. LLE 4- HF to 4+/5 distally. No gross sensory deficits. CN exam unremarkable.  Skin: Skin is warm and dry.   Psychiatric: Her behavior is normal, except as above.    Assessment/Plan: 1. 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  which require 3+ hours per day of interdisciplinary therapy in a comprehensive inpatient rehab setting. Physiatrist is providing close team supervision and 24 hour management of active medical problems listed below. Physiatrist and rehab team continue to assess barriers to discharge/monitor patient progress toward functional and medical goals. FIM: FIM - Bathing Bathing Steps Patient Completed: Chest;Right Arm;Left Arm;Abdomen;Right upper leg;Left upper leg;Left lower leg (including foot);Right lower leg (including foot);Front perineal area Bathing: 4: Min-Patient completes 8-9 1167f 10 parts or 75+ percent  FIM - Upper Body Dressing/Undressing Upper body dressing/undressing steps patient completed: Thread/unthread right sleeve of pullover shirt/dresss;Thread/unthread left sleeve of pullover shirt/dress;Put head through opening of pull over shirt/dress;Pull shirt over trunk Upper body dressing/undressing: 5: Set-up assist to: Apply TLSO, cervical collar FIM - Lower Body Dressing/Undressing Lower body dressing/undressing steps patient completed: Don/Doff right sock;Don/Doff left sock;Don/Doff right shoe;Don/Doff left shoe Lower body dressing/undressing: 5: Supervision: Safety issues/verbal cues (with reacher, sock aid, and shoe horn)  FIM - Toileting Toileting steps completed by patient: Adjust clothing prior to toileting;Performs perineal hygiene Toileting Assistive Devices: Grab bar or rail  for support Toileting: 3: Mod-Patient completed 2 of 3  steps  FIM - Diplomatic Services operational officer Devices: Grab bars;Walker Toilet Transfers: 5-To toilet/BSC: Supervision (verbal cues/safety issues);4-From toilet/BSC: Min A (steadying Pt. > 75%)  FIM - Banker Devices: Orthosis;Walker;HOB elevated (TLSO) Bed/Chair Transfer: 5: Sit > Supine: Supervision (verbal cues/safety issues);5: Bed > Chair or W/C: Supervision (verbal cues/safety issues);5: Chair or W/C > Bed: Supervision (verbal cues/safety issues);5: Supine > Sit: Supervision (verbal cues/safety issues)  FIM - Locomotion: Wheelchair Locomotion: Wheelchair: 0: Activity did not occur FIM - Locomotion: Ambulation Locomotion: Ambulation Assistive Devices: Walker - Rolling;Orthosis;Other (comment) (TLSO) Ambulation/Gait Assistance: 5: Supervision (close supervision) Locomotion: Ambulation: 2: Travels 50 - 149 ft with supervision/safety issues  Comprehension Comprehension Mode: Auditory Comprehension: 5-Follows basic conversation/direction: With extra time/assistive device  Expression Expression Mode: Verbal Expression: 5-Expresses complex 90% of the time/cues < 10% of the time  Social Interaction Social Interaction: 5-Interacts appropriately 90% of the time - Needs monitoring or encouragement for participation or interaction.  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 DVT Prophylaxis/Anticoagulation: SQ Lovenox.  2. Pain Management: prn tylenol for now.  3. Mood: Seems blunted. Provide ego support. LCSW to follow for evaluation and support.  4. Neuropsych: This patient is capable of making decisions on her own behalf.  5. Lumbar laminectomy with post op I and D for infection: To continues on  keflex for a year per NS at [facility].  6. SDH s/p evacuation: On keppra bid for seizures/seizure prophylaxis.  7. DM type 2: Will monitor with ac/hs checks. Continue levemir at bedtime with SSI for elevated BS.   -reasonable control at present 8. OSA with hypercarbia: To continue CPAP.  9. Neutropenia: Question etiology. Diff pending.  10 Anemia of chronic disease/iron deficiency: Added iron supplement. hgb 8.7 11. CV- bp's a little better   -prinivil still on hold.   to Continue coreg  -encourage fluids  TEDS.  LOS (Days) 8 A FACE TO FACE EVALUATION WAS PERFORMED  Erick Colace 12/19/2013 7:23 AM

## 2013-12-19 NOTE — Progress Notes (Addendum)
Pt has refused CPAP tonight.  Notified Pt to have RN call us should she change her mind.  Pt currently stable and laying comfortably in bed.  RT to monitor as needed.

## 2013-12-19 NOTE — Progress Notes (Signed)
Speech Language Pathology Daily Session Note  Patient Details  Name: Cheyenne Frank MRN: [id number] Date of Birth: 04/14/1941  Today's Date: 12/19/2013 Time: [id number] Time Calculation (min): 30 min  Short Term Goals: Week 1: SLP Short Term Goal 1 (Week 1): Patient will utilize strategies to increase word finding ability at the phrase level with min A multimodal cueing.  SLP Short Term Goal 2 (Week 1): Pt will demonstrate basic problem solving and safety awareness to utilize call bell as needed with Min cues SLP Short Term Goal 3 (Week 1): Pt will utilize external memory aids to recall new anddaily information with supervision level cueing SLP Short Term Goal 4 (Week 1): Pt will verbally communicate basic wants/needs with supervision level cueing  Skilled Therapeutic Interventions: Skilled intervention expressive language complete, with short term goals addressed.  Patient had good session.  She independently communicated wants/needs to clinician and was able to recall 50% of strategies necessary to increase word finding.  She required min A cues to use external memory aids to answer daily information questions.  Continue with current treatment plan.    FIM:  Comprehension Comprehension Mode: Auditory Comprehension: 5-Follows basic conversation/direction: With no assist Expression Expression Mode: Verbal Expression: 5-Expresses complex 90% of the time/cues < 10% of the time  Pain Pain Assessment Pain Assessment: No/denies pain  Therapy/Group: Individual Therapy  Lenny PastelSturgill, Ahad Colarusso Leigh 12/19/2013, 4:10 PM

## 2013-12-20 ENCOUNTER — Inpatient Hospital Stay (HOSPITAL_COMMUNITY): Payer: Medicare Other

## 2013-12-20 ENCOUNTER — Inpatient Hospital Stay (HOSPITAL_COMMUNITY): Payer: Medicare Other | Admitting: *Deleted

## 2013-12-20 LAB — GLUCOSE, CAPILLARY
GLUCOSE-CAPILLARY: 158 mg/dL — AB (ref 70–99)
Glucose-Capillary: 150 mg/dL — ABNORMAL HIGH (ref 70–99)
Glucose-Capillary: 162 mg/dL — ABNORMAL HIGH (ref 70–99)
Glucose-Capillary: 167 mg/dL — ABNORMAL HIGH (ref 70–99)

## 2013-12-20 LAB — CBC
HEMATOCRIT: 31.1 % — AB (ref 36.0–46.0)
HEMOGLOBIN: 9.5 g/dL — AB (ref 12.0–15.0)
MCH: 26.2 pg (ref 26.0–34.0)
MCHC: 30.5 g/dL (ref 30.0–36.0)
MCV: 85.9 fL (ref 78.0–100.0)
Platelets: 257 10*3/uL (ref 150–400)
RBC: 3.62 MIL/uL — ABNORMAL LOW (ref 3.87–5.11)
RDW: 16.3 % — AB (ref 11.5–15.5)
WBC: 7.6 10*3/uL (ref 4.0–10.5)

## 2013-12-20 NOTE — Progress Notes (Signed)
Occupational Therapy Weekly Progress Note  Patient Details  Name: Cheyenne Frank MRN: [id number] Date of Birth: 1941-02-14  Beginning of progress report period: December 12, 2013 End of progress report period: December 20, 2013  Today's Date: 12/20/2013 Time: 1191-4782 Time Calculation (min): 30 min  Patient has met 2 of 3 short term goals.  Patient has been progressing well in rehab as she is now completing functional transfers at a supervision level and occasional min assist. Patient has learned use of AE quickly and initiates use of it during self-care tasks approx 75% of time. Patient's daughter have engaged in family education and training at this time. Patient is very excited to discharge home this week and is currently on track to meet her goals.    Patient continues to demonstrate the following deficits: decreased postural control, decreased standing balance, decreased activity tolerance, decreased coordination, decreased safety awareness, decreased balance reactions, decreased strength and therefore will continue to benefit from skilled OT intervention to enhance overall performance with BADL, activity tolerance, ability to compensate for deficits.  Patient progressing toward long term goals..  Continue plan of care.  OT Short Term Goals Week 1:  OT Short Term Goal 1 (Week 1): Pt. will be supervison with LB dressing OT Short Term Goal 1 - Progress (Week 1): Not met OT Short Term Goal 2 (Week 1): Pt. will be supervision with toilet transfers OT Short Term Goal 2 - Progress (Week 1): Met OT Short Term Goal 3 (Week 1): Pt. will be supervision with shower transfers OT Short Term Goal 3 - Progress (Week 1): Met Week 2:  OT Short Term Goal 1 (Week 2): Focus on LTGs  Skilled Therapeutic Interventions/Progress Updates:    Pt seen for 1:1 OT session with focus on standing balance, functional transfers, and LB dressing. Pt received supine in bed requesting to go to bathroom. Ambulated with RW and  close supervision. Pt completed toilet task with supervision and min cues for adherence to precautions. Practiced LB dressing (shoes and socks) using sock-aid and LH shoe horn. Pt donned socks with setup assist only. Pt required mod cues for technique with utilizing LH shoe horn and increased difficulty. Educated on alternative technique for holding tongue up using elastic shoe laces and pt returned demonstration without use of LH shoe horn and in less time. Pt then ambulated to sink with RW at supervision level to complete grooming tasks. Pt returned to w/c and left with all needs in reach.  Therapy Documentation Precautions:  Precautions Precautions: Fall;Back Precaution Comments: Back/Spinal precautions Required Braces or Orthoses: Spinal Brace Spinal Brace: Thoracolumbosacral orthotic;Applied in sitting position Restrictions Weight Bearing Restrictions: No General:   Vital Signs:   Pain: No report of pain during therapy session.   See FIM for current functional status  Therapy/Group: Individual Therapy  Doriana Mazurkiewicz, Quillian Quince 12/20/2013, 1:25 PM

## 2013-12-20 NOTE — Progress Notes (Signed)
Patient refused to wear CPAP for the night. Will continue to monitor patients status

## 2013-12-20 NOTE — Progress Notes (Signed)
[place] PHYSICAL MEDICINE & REHABILITATION     PROGRESS NOTE    Subjective/Complaints: Sleeping soundly A 12 point review of systems has been performed and if not noted above is otherwise negative.  Objective: Vital Signs: Blood pressure 129/55, pulse 80, temperature 97.4 F (36.3 C), temperature source Oral, resp. rate 19, weight 94.7 kg (208 lb 12.4 oz), SpO2 97.00%. No results found.  Recent Labs  12/18/13 0618  WBC 5.4  HGB 13.1  HCT 41.3  PLT 148*   No results found for this basename: NA, K, CL, CO, GLUCOSE, BUN, CREATININE, CALCIUM,  in the last 72 hours CBG (last 3)   Recent Labs  12/19/13 1121 12/19/13 1655 12/19/13 2049  GLUCAP 145* 120* 185*    Wt Readings from Last 3 Encounters:  12/16/13 94.7 kg (208 lb 12.4 oz)  04/06/13 104.327 kg (230 lb)  04/06/13 104.327 kg (230 lb)    Physical Exam:  Constitutional: . She appears well-developed and well-nourished.  HENT:  Head: Normocephalic , healing incision L Temp Right Ear: External ear normal.  Left Ear: External ear normal.  Eyes: Conjunctivae and EOM are normal. Pupils are equal, round, and reactive to light.  Neck: . Neck supple. No JVD present. No tracheal deviation present.   Cardiovascular: Normal rate and regular rhythm. Exam reveals no gallop and no friction rub.  No murmur heard.  Pulmonary/Chest: Effort normal and breath sounds normal. No respiratory distress. She has no wheezes. She has no rales. She exhibits no tenderness.  Abdominal: Soft. Bowel sounds are normal. She exhibits no distension. There is no tenderness. There is no rebound.   Musculoskeletal: She exhibits no edema and no tenderness.  Lymphadenopathy:  She has no cervical adenopathy.  Neurological: sleeping  Assessment/Plan: 1. 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  which require 3+ hours per day of interdisciplinary therapy in a comprehensive  inpatient rehab setting. Physiatrist is providing close team supervision and 24 hour management of active medical problems listed below. Physiatrist and rehab team continue to assess barriers to discharge/monitor patient progress toward functional and medical goals. FIM: FIM - Bathing Bathing Steps Patient Completed: Chest;Right Arm;Left Arm;Abdomen;Right upper leg;Left upper leg;Left lower leg (including foot);Right lower leg (including foot);Front perineal area Bathing: 4: Min-Patient completes 8-9 1065f 10 parts or 75+ percent  FIM - Upper Body Dressing/Undressing Upper body dressing/undressing steps patient completed: Thread/unthread right sleeve of pullover shirt/dresss;Thread/unthread left sleeve of pullover shirt/dress;Put head through opening of pull over shirt/dress;Pull shirt over trunk Upper body dressing/undressing: 5: Set-up assist to: Apply TLSO, cervical collar FIM - Lower Body Dressing/Undressing Lower body dressing/undressing steps patient completed: Don/Doff right sock;Don/Doff left sock;Don/Doff right shoe;Don/Doff left shoe Lower body dressing/undressing: 5: Supervision: Safety issues/verbal cues (with reacher, sock aid, and shoe horn)  FIM - Toileting Toileting steps completed by patient: Adjust clothing prior to toileting;Performs perineal hygiene Toileting Assistive Devices: Grab bar or rail for support Toileting: 3: Mod-Patient completed 2 of 3 steps  FIM - Diplomatic Services operational officerToilet Transfers Toilet Transfers Assistive Devices: Grab bars;Walker Toilet Transfers: 5-To toilet/BSC: Supervision (verbal cues/safety issues);4-From toilet/BSC: Min A (steadying Pt. > 75%)  FIM - BankerBed/Chair Transfer Bed/Chair Transfer Assistive Devices: Orthosis;Walker;HOB elevated (TLSO) Bed/Chair Transfer: 5: Sit > Supine: Supervision (verbal cues/safety issues);5: Bed > Chair or W/C: Supervision (verbal cues/safety issues);5: Chair or W/C > Bed: Supervision (verbal cues/safety issues);5: Supine > Sit:  Supervision (verbal cues/safety issues)  FIM - Locomotion: Wheelchair Locomotion: Wheelchair: 0: Activity did  not occur FIM - Locomotion: Ambulation Locomotion: Ambulation Assistive Devices: Walker - Rolling;Orthosis;Other (comment) (TLSO) Ambulation/Gait Assistance: 5: Supervision (close supervision) Locomotion: Ambulation: 2: Travels 50 - 149 ft with supervision/safety issues  Comprehension Comprehension Mode: Auditory Comprehension: 5-Follows basic conversation/direction: With extra time/assistive device  Expression Expression Mode: Verbal Expression: 5-Expresses complex 90% of the time/cues < 10% of the time  Social Interaction Social Interaction: 5-Interacts appropriately 90% of the time - Needs monitoring or encouragement for participation or interaction.  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 DVT Prophylaxis/Anticoagulation: SQ Lovenox.  2. Pain Management: prn tylenol for now.  3. Mood: Seems blunted. Provide ego support. LCSW to follow for evaluation and support.  4. Neuropsych: This patient is capable of making decisions on her own behalf.  5. Lumbar laminectomy with post op I and D for infection: To continues on keflex for a year per NS at [facility].  6. SDH s/p evacuation: On keppra bid for seizures/seizure prophylaxis.  7. DM type 2: Will monitor with ac/hs checks. Continue levemir at bedtime with SSI for elevated BS.   -reasonable control at present 8. OSA with hypercarbia: To continue CPAP. Not always compliant 9. Neutropenia: Question etiology. Diff pending.  10 Anemia of chronic disease/iron deficiency: Added iron supplement. hgb 8.7 11. CV- bp's a little better   -prinivil still on hold.   to Continue coreg  -encourage  fluids  TEDS.  LOS (Days) 9 A FACE TO FACE EVALUATION WAS PERFORMED  Erick Colace 12/20/2013 6:46 AM

## 2013-12-20 NOTE — Plan of Care (Signed)
Problem: RH KNOWLEDGE DEFICIT Goal: RH STG INCREASE KNOWLEDGE OF DIABETES Patient Metta Clines/caregiver will be able to independently verbalize strategies to manage diabetes at home ( diet, medications, follow up with MD, monitoring Blood glucose , s/s of high and low blood sugar)  Outcome: Progressing Per discussion with patient uses long acting and short acting insulin at home, short acting only if blood glucose above certain parameters, uses pen for insulin administration, checks blood glucose 3-4 times a day. Denies any questions or concerns with diabetes management at home

## 2013-12-20 NOTE — Progress Notes (Signed)
Occupational Therapy Session Note  Patient Details  Name: Cheyenne Frank MRN: [id number] Date of Birth: 12/14/1940  Today's Date: 12/20/2013 Time: 1400-1455 Time Calculation (min): 55 min  Short Term Goals: Week 2:  OT Short Term Goal 1 (Week 2): Focus on LTGs  Skilled Therapeutic Interventions/Progress Updates:    Therapy session focused on Easter Egg hunt to address activity tolerance, standing balance, reaching outside BOS, environmental scanning, and safety/adherence to precautions. Patient required supervision for ambulating throughout therapy gym with min cues to adhere to back precautions on 1 occasion when attempting to reach for egg and mod cues for problem solving correct technique to retrieve egg. Pt required min cues for hand placement during stand>sit throughout session. Pt then participated in Easter-themed ball toss with letter ball with emphasis on sitting/standing balance, adherence to precautions, and cognitive remediation tasks to name Odis Lusteraster themes starting with letters on ball. Pt completed task at Mod I level to follow precautions and min cues for naming easter themes starting with letters on ball towards end of activity. Pt very social throughout activity and provided encouragement to other pt's present.  Therapy Documentation Precautions:  Precautions Precautions: Fall;Back Precaution Comments: Back/Spinal precautions Required Braces or Orthoses: Spinal Brace Spinal Brace: Thoracolumbosacral orthotic;Applied in sitting position Restrictions Weight Bearing Restrictions: No General:   Vital Signs:   Pain: No report of pain during therapy session.   See FIM for current functional status  Therapy/Group: Individual Therapy  Daneil Danerkinson, Yared Susan N 12/20/2013, 3:19 PM

## 2013-12-21 ENCOUNTER — Inpatient Hospital Stay (HOSPITAL_COMMUNITY): Payer: Medicare Other

## 2013-12-21 ENCOUNTER — Inpatient Hospital Stay (HOSPITAL_COMMUNITY): Payer: Medicare Other | Admitting: Physical Therapy

## 2013-12-21 ENCOUNTER — Encounter (HOSPITAL_COMMUNITY): Payer: Medicare Other | Admitting: Occupational Therapy

## 2013-12-21 DIAGNOSIS — D509 Iron deficiency anemia, unspecified: Secondary | ICD-10-CM

## 2013-12-21 DIAGNOSIS — E119 Type 2 diabetes mellitus without complications: Secondary | ICD-10-CM

## 2013-12-21 DIAGNOSIS — I6350 Cerebral infarction due to unspecified occlusion or stenosis of unspecified cerebral artery: Secondary | ICD-10-CM

## 2013-12-21 DIAGNOSIS — S065XAA Traumatic subdural hemorrhage with loss of consciousness status unknown, initial encounter: Secondary | ICD-10-CM

## 2013-12-21 DIAGNOSIS — S065X9A Traumatic subdural hemorrhage with loss of consciousness of unspecified duration, initial encounter: Secondary | ICD-10-CM

## 2013-12-21 LAB — GLUCOSE, CAPILLARY
GLUCOSE-CAPILLARY: 165 mg/dL — AB (ref 70–99)
GLUCOSE-CAPILLARY: 200 mg/dL — AB (ref 70–99)
Glucose-Capillary: 118 mg/dL — ABNORMAL HIGH (ref 70–99)
Glucose-Capillary: 162 mg/dL — ABNORMAL HIGH (ref 70–99)

## 2013-12-21 NOTE — Progress Notes (Signed)
Pt. Refuses CPAP at this time, but states that she may wear it tomorrow night. Pt. Was made aware to let RT or RN know anytime during the night if she decides to wear CPAP.

## 2013-12-21 NOTE — Progress Notes (Signed)
Speech Language Pathology Weekly Progress and Session Notes  Patient Details  Name: Cheyenne Frank MRN: [id number] Date of Birth: 05/03/1941  Beginning of progress report period: December 12, 2013 End of progress report period: December 21, 2013  Today's Date: 12/21/2013 Time: 0915-1000 Time Calculation (min): 45 min  Short Term Goals: Week 1: SLP Short Term Goal 1 (Week 1): Patient will utilize strategies to increase word finding ability at the phrase level with min A multimodal cueing.  SLP Short Term Goal 1 - Progress (Week 1): Met SLP Short Term Goal 2 (Week 1): Pt will demonstrate basic problem solving and safety awareness to utilize call bell as needed with Min cues SLP Short Term Goal 2 - Progress (Week 1): Progressing toward goal SLP Short Term Goal 3 (Week 1): Pt will utilize external memory aids to recall new anddaily information with supervision level cueing SLP Short Term Goal 3 - Progress (Week 1): Progressing toward goal SLP Short Term Goal 4 (Week 1): Pt will verbally communicate basic wants/needs with supervision level cueing SLP Short Term Goal 4 - Progress (Week 1): Met    New Short Term Goals: Week 2: SLP Short Term Goal 1 (Week 2): Patient will utilize strategies to increase word finding ability at the phrase level with supervision level cueing.  SLP Short Term Goal 2 (Week 2): Pt will demonstrate basic problem solving and safety awareness to utilize call bell as needed with Min cues SLP Short Term Goal 3 (Week 2): Pt will utilize external memory aids to recall new anddaily information with supervision level cueing  Weekly Progress Updates: Pt has met 2 out of 4 STGs this reporting period due to increase in cognitive-linguistic function. Pt requires Min cues for use of word-finding strategies during structured tasks, and is communicating her basic wants/needs without assistance. She requires supervision for basic problem solving, and supervision-Min cues for use of external aids  to facilitate recall of information. Education is ongoing. Pt will benefit from continued SLP services to maximize functional independence and communication prior to discharge home with family.   Intensity:   Frequency: 5 out of 7 days Duration/Length of Stay: 10 days Treatment/Interventions: Cognitive remediation/compensation;Speech/Language facilitation;Therapeutic Activities;Patient/family education;Cueing hierarchy;Environmental controls;Functional tasks;Internal/external aids   Daily Session Skilled Therapeutic Interventions: Treatment focused on cognitive-linguistic goals. SLP facilitated session with structured linguistic task to utilize word-finding skills. Pt completed categorical naming task, using specific letters in the word-initial position, with Mod faded to Min cues for word generation. Pt discussed her weekend and described her daughter's current health concerns with supervision level cues for word-finding. When asked to describe what plants were in her garden, pt utilized her external memory aid to identify word-finding strategies with Mod I, and utilized them with Min cues.    FIM:  Comprehension Comprehension Mode: Auditory Comprehension: 5-Follows basic conversation/direction: With extra time/assistive device Expression Expression Mode: Verbal Expression: 5-Expresses basic needs/ideas: With extra time/assistive device Social Interaction Social Interaction: 7-Interacts appropriately with others - No medications needed. Problem Solving Problem Solving: 5-Solves basic problems: With no assist Memory Memory: 4-Recognizes or recalls 75 - 89% of the time/requires cueing 10 - 24% of the time General    Pain Pain Assessment Pain Assessment: No/denies pain  Therapy/Group: Individual Therapy   Germain Osgood, M.A. CCC-SLP [phone]  Germain Osgood 12/21/2013, 11:12 AM

## 2013-12-21 NOTE — Progress Notes (Signed)
[facility]IC[facility] REHABILITATION     PROGRESS NOTE    Subjective/Complaints: RN notes abd and back wounds healing well A 12 point review of systems has been performed and if not noted above is otherwise negative.  Objective: Vital Signs: Blood pressure 132/72, pulse 76, temperature 97.8 F (36.6 C), temperature source Oral, resp. rate 18, weight 94.7 kg (208 lb 12.4 oz), SpO2 92.00%. No results found.  Recent Labs  12/20/13 0924  WBC 7.6  HGB 9.5*  HCT 31.1*  PLT 257   No results found for this basename: NA, K, CL, CO, GLUCOSE, BUN, CREATININE, CALCIUM,  in the last 72 hours CBG (last 3)   Recent Labs  12/20/13 1138 12/20/13 1653 12/20/13 2105  GLUCAP 158* 162* 167*    Wt Readings from Last 3 Encounters:  12/16/13 94.7 kg (208 lb 12.4 oz)  04/06/13 104.327 kg (230 lb)  04/06/13 104.327 kg (230 lb)    Physical Exam:  Constitutional: . She appears well-developed and well-nourished.  HENT:  Head: Normocephalic , healing incision L Temp Right Ear: External ear normal.  Left Ear: External ear normal.  Eyes: Conjunctivae and EOM are normal. Pupils are equal, round, and reactive to light.  Neck: . Neck supple. No JVD present. No tracheal deviation present.   Cardiovascular: Normal rate and regular rhythm. Exam reveals no gallop and no friction rub.  No murmur heard.  Pulmonary/Chest: Effort normal and breath sounds normal. No respiratory distress. She has no wheezes. She has no rales. She exhibits no tenderness.  Abdominal: Soft.Midline infraumbilical incision CDI Bowel sounds are normal. She exhibits no distension. There is no tenderness. There is no rebound.  Skin-thoraco- lumbar incision closed no erythema Musculoskeletal: She exhibits no edema and no tenderness.  Lymphadenopathy:  She has no cervical adenopathy.  Neurological: sleeping  Assessment/Plan: 1. 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  which require 3+ hours per day of interdisciplinary therapy in a comprehensive inpatient rehab setting. Physiatrist is providing close team supervision and 24 hour management of active medical problems listed below. Physiatrist and rehab team continue to assess barriers to discharge/monitor patient progress toward functional and medical goals. FIM: FIM - Bathing Bathing Steps Patient Completed: Chest;Right Arm;Left Arm;Abdomen;Right upper leg;Left upper leg;Left lower leg (including foot);Right lower leg (including foot);Front perineal area Bathing: 4: Min-Patient completes 8-9 2119f 10 parts or 75+ percent  FIM - Upper Body Dressing/Undressing Upper body dressing/undressing steps patient completed: Thread/unthread right sleeve of pullover shirt/dresss;Thread/unthread left sleeve of pullover shirt/dress;Put head through opening of pull over shirt/dress;Pull shirt over trunk Upper body dressing/undressing: 5: Set-up assist to: Apply TLSO, cervical collar FIM - Lower Body Dressing/Undressing Lower body dressing/undressing steps patient completed: Don/Doff right sock;Don/Doff left sock;Don/Doff right shoe;Don/Doff left shoe Lower body dressing/undressing: 5: Supervision: Safety issues/verbal cues (using AE)  FIM - Toileting Toileting steps completed by patient: Adjust clothing prior to toileting;Performs perineal hygiene;Adjust clothing after toileting Toileting Assistive Devices: Grab bar or rail for support Toileting: 5: Supervision: Safety issues/verbal cues  FIM - Diplomatic Services operational officerToilet Transfers Toilet Transfers Assistive Devices: Grab bars;Walker Toilet Transfers: 5-To toilet/BSC: Supervision (verbal cues/safety issues);4-From toilet/BSC: Min A (steadying Pt. > 75%)  FIM - BankerBed/Chair Transfer Bed/Chair Transfer Assistive Devices: Orthosis;Walker;HOB elevated (TLSO) Bed/Chair Transfer: 5: Sit > Supine: Supervision (verbal cues/safety issues);5: Bed > Chair or W/C: Supervision  (verbal cues/safety issues);5: Chair or W/C > Bed: Supervision (verbal cues/safety issues);5: Supine > Sit: Supervision (verbal cues/safety  issues)  FIM - Locomotion: Wheelchair Locomotion: Wheelchair: 0: Activity did not occur FIM - Locomotion: Ambulation Locomotion: Ambulation Assistive Devices: Walker - Rolling;Orthosis;Other (comment) (TLSO) Ambulation/Gait Assistance: 5: Supervision (close supervision) Locomotion: Ambulation: 2: Travels 50 - 149 ft with supervision/safety issues  Comprehension Comprehension Mode: Auditory Comprehension: 5-Follows basic conversation/direction: With extra time/assistive device  Expression Expression Mode: Verbal Expression: 5-Expresses complex 90% of the time/cues < 10% of the time  Social Interaction Social Interaction: 5-Interacts appropriately 90% of the time - Needs monitoring or encouragement for participation or interaction.  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 DVT Prophylaxis/Anticoagulation: SQ Lovenox.  2. Pain Management: prn tylenol for now.  3. Mood: Seems blunted. Provide ego support. LCSW to follow for evaluation and support.  4. Neuropsych: This patient is capable of making decisions on her own behalf.  5. Lumbar laminectomy with post op I and D for infection: To continues on keflex for a year per NS at [facility].  6. SDH s/p evacuation: On keppra bid for seizures/seizure prophylaxis.  7. DM type 2: Will monitor with ac/hs checks. Continue levemir at bedtime with SSI for elevated BS.   -reasonable control at present 8. OSA with hypercarbia: To continue CPAP. Not always compliant 9. Neutropenia: Question etiology. Diff pending.  10 Anemia of chronic disease/iron deficiency: Added iron  supplement. hgb 8.7 11. CV- bp's a little better   -prinivil still on hold.   to Continue coreg  -encourage fluids  TEDS.  LOS (Days) 10 A FACE TO FACE EVALUATION WAS PERFORMED  Erick Colace 12/21/2013 6:27 AM

## 2013-12-21 NOTE — Progress Notes (Signed)
Physical Therapy Weekly Progress Note  Patient Details  Name: Cheyenne Frank MRN: [id number] Date of Birth: 03/13/1941  Beginning of progress report period: December 12, 2013 End of progress report period: December 21, 2013  Today's Date: 12/21/2013 Time: 1001-1059 and 6063-0160 Time Calculation (min): 58 min and 30 min  Patient has met 2 of 3 short term goals.  Pt demonstrates improved safety awareness, improved knowledge of precautions, increased activity tolerance, improved gait stability, and improved stability/independence with functional mobility.   Patient continues to demonstrate the following deficits: decreased cardiovascular/muscular endurance, muscle weakness, unbalance muscle activation, decreased safety awareness, impaired postural/gait stability, decreased balance reactions, and therefore will continue to benefit from skilled PT intervention to enhance overall performance with activity tolerance, balance, postural control, ability to compensate for deficits, functional use of  right lower extremity, knowledge of precautions and safety awareness.  Patient progressing toward long term goals..  Continue plan of care.  PT Short Term Goals Week 1:  PT Short Term Goal 1 (Week 1): Patient will be able to perform bed mobility with S/Mod-Independent assist. PT Short Term Goal 1 - Progress (Week 1): Met PT Short Term Goal 2 (Week 1): Patient will be able to perform transfers with S/Mod-Independent assist. PT Short Term Goal 2 - Progress (Week 1): Met PT Short Term Goal 3 (Week 1): Patient will be able to ambulate using LRAD x 180' with S/Mod-Independent assist. PT Short Term Goal 3 - Progress (Week 1): Progressing toward goal Week 2:  PT Short Term Goal 1 (Week 2): STG's=LTG's secondary to ELOS  Skilled Therapeutic Interventions/Progress Updates:    Treatment Session 1: Pt received seated in w/c; agreeable to session. Gait x125', x40' in controlled and home environments with min A. Pt  demonstrates narrow BOS, R hip drop during RLE swing phase, decreased RLE clearance during RLE swing phase, and R trunk lean throughout gait pattern. PT provided manual facilitation of R hip/knee flexion during RLE swing phase to promote RLE clearance. Verbal cueing provided to facilitate wider BOS (focus on LLE abduction during gait). Supine<>sit on apartment bed (HOB flat, no rails) with supervision, increased time; effective logroll techniue without cueing. Multiple sit<>stand transfers from w/c, mat table, apartment bed with rolling walker and supervision, subtle to min vebal cueing for hand placement within effective within-session carryover. Negotiated 3 steps with bilat UE support at R rail, sideways, with step-to pattern and supervision. See below for detailed description of therapeutic exercises, pre-gait interventions performed to address gait abnormalities. Returned to room via gait [phone] in controlled environment with rolling walker and close supervision; pt exhibited visual within-session improvement in R hip drop during gait. Session ended in pt room, where pt was left seated in w/c with all needs within reach. Pt required no cueing to adhere to spinal precautions during this session.  Treatment Session 2: Pt received semi-reclined in bed; agreeable to session. Performed supine>sit with HOB flat, no rails, requiring supervision, mod cueing for technique. Seated EOB, donned TLSO. Pt performed gait x150' in controlled environment with rolling walker, min guard. Performed gait x30' with LUE support at L hallway hand rail, tactile cueing at L iliac crest to promote L innominate elevation during RLE swing phase to prevent R hip drop; pt with ineffective carryover. Transitioned to exercise targeting R quadratus lumborum activation; see below for detailed description. Returned to room via gait x150' in controlled environment with rolling walker and mi guard; activation of R quadratus lumborum palpable; R  hip drop less prominent during gait  trial. Session ended in pt room, where pt was left semi-reclined in bed with 3 bed rails up, bed alarm on, and all needs within reach. Pt required cueing x1 to adhere to spinal precautions during this session.  Therapy Documentation Precautions:  Precautions Precautions: Fall;Back Precaution Comments: Back/Spinal precautions Required Braces or Orthoses: Spinal Brace Spinal Brace: Thoracolumbosacral orthotic;Applied in sitting position Restrictions Weight Bearing Restrictions: No Pain: Pain Assessment Pain Assessment: No/denies pain Pain Score: 0-No pain Locomotion : Ambulation Ambulation/Gait Assistance: 4: Min assist  Pre-gait: Standing with bilat UE support at rolling walker, pt performed the following pre-gait activities with mirror anterior to pt for visual feedback:  RLE advancement with tactile cueing at anterior/superior R iliac crest to promote R pelvic elevation, prevent L hip drop. Activation of L gluteus medius palpable. Pt with difficulty elevating R innominate during R step initiation.  Therapeutic Exercises:  Session 1: L sidestepping with bilat UE support at hallway hand rail x25 reps (to pt fatigue) with tactile cueing at L gluteus medius; verbal cueing to maintain neutral hip rotation. Attempted R hip hiking for R quadratus lumborum activation while standing on 6" step with bilat UE support at R rail; ended exercise secondary to pt-reported R hip pain.  Session 2: Seated LUE anterolateral reaching (while adhering to spinal precautions) until R hip hike elicited to promote R quadratus lumborum activation, facilitate R innominate elevation during initiation of RLE step during gait. Palpable R quadratus lumborum contraction noted.  See FIM for current functional status  Therapy/Group: Individual Therapy  [name], [name] 12/21/2013, 12:52 PM

## 2013-12-21 NOTE — Progress Notes (Signed)
Occupational Therapy Session Note  Patient Details  Name: Cheyenne Frank MRN: [id number] Date of Birth: 09/25/1940  Today's Date: 12/21/2013 Time: [id number] Time Calculation (min): 54 min  Short Term Goals: Week 2:  OT Short Term Goal 1 (Week 2): Focus on LTGs  Skilled Therapeutic Interventions/Progress Updates:    Engaged in ADL retraining with focus on self-care tasks of bathing and dressing while using necessary AE and adhering to back precautions.  Pt completed bathing at sit > stand level in shower with use of long handled sponge to wash buttocks, back, and feet to maintain back precautions.  Pt with 1 LOB in shower requiring min/steady assist to regain.  Pt able to report need to utilize reacher when she dropped wash cloth in shower.  Pt completed dressing with setup assist with use of reacher to don underwear, pants, and shoes.  Pt chose to not don socks this session.  Grooming completed in standing initially, however pt reported increased fatigue and requested to sit for rest of grooming tasks.  Therapy Documentation Precautions:  Precautions Precautions: Fall;Back Precaution Comments: Back/Spinal precautions Required Braces or Orthoses: Spinal Brace Spinal Brace: Thoracolumbosacral orthotic;Applied in sitting position Restrictions Weight Bearing Restrictions: No Pain:  Pt with no c/o pain  See FIM for current functional status  Therapy/Group: Individual Therapy  Rosalio LoudHOXIE, Quintan Saldivar 12/21/2013, 9:58 AM

## 2013-12-22 ENCOUNTER — Inpatient Hospital Stay (HOSPITAL_COMMUNITY): Payer: Medicare Other

## 2013-12-22 ENCOUNTER — Inpatient Hospital Stay (HOSPITAL_COMMUNITY): Payer: Medicare Other | Admitting: Speech Pathology

## 2013-12-22 ENCOUNTER — Inpatient Hospital Stay (HOSPITAL_COMMUNITY): Payer: Medicare Other | Admitting: Physical Therapy

## 2013-12-22 DIAGNOSIS — I6350 Cerebral infarction due to unspecified occlusion or stenosis of unspecified cerebral artery: Secondary | ICD-10-CM

## 2013-12-22 DIAGNOSIS — S065XAA Traumatic subdural hemorrhage with loss of consciousness status unknown, initial encounter: Secondary | ICD-10-CM

## 2013-12-22 DIAGNOSIS — D509 Iron deficiency anemia, unspecified: Secondary | ICD-10-CM

## 2013-12-22 DIAGNOSIS — E119 Type 2 diabetes mellitus without complications: Secondary | ICD-10-CM

## 2013-12-22 DIAGNOSIS — S065X9A Traumatic subdural hemorrhage with loss of consciousness of unspecified duration, initial encounter: Secondary | ICD-10-CM

## 2013-12-22 LAB — GLUCOSE, CAPILLARY
GLUCOSE-CAPILLARY: 161 mg/dL — AB (ref 70–99)
Glucose-Capillary: 137 mg/dL — ABNORMAL HIGH (ref 70–99)
Glucose-Capillary: 185 mg/dL — ABNORMAL HIGH (ref 70–99)
Glucose-Capillary: 206 mg/dL — ABNORMAL HIGH (ref 70–99)

## 2013-12-22 NOTE — Progress Notes (Signed)
Occupational Therapy Session Note  Patient Details  Name: Cheyenne Frank MRN: [id number] Date of Birth: 09/05/1941  Today's Date: 12/22/2013 Time: 0730-0829 and 1130-1200  Time calculation (min): 59 min and 30 min   Short Term Goals: Week 2:  OT Short Term Goal 1 (Week 2): Focus on LTGs  Skilled Therapeutic Interventions/Progress Updates:    Session 1: Pt seen for ADL retraining with focus on standing balance, use of AE, and adherence to back precautions. Pt received supine in bed and requesting to wash at sink this AM. Pt transferred to w/c with RW and verbal cue for hand placement for sit>stand. Completed bathing at sink initiating use of LH sponge for LB dressing. Therapist educated on using reacher to hold towel when assisting with drying BLE. Completed dressing from w/c level using reacher and elastic shoe strings with more then reasonable amount of time. Pt fatigued at end of session, therefore completing grooming tasks from w/c level. Pt left sitting in w/c with all needs in reach.   Session 2: Pt seen for 1:1 OT session with focus on safety, adherence to precautions, standing balance, and activity tolerance during IADL task. Pt received supine in bed with daughter present. Pt ambulated to ADL apartment with daughter present and completed simple meal prep task using microwave. Pt completed at supervision level with min cues for directions. Pt with carryover of turning to face counter with RW when reaching for items vs breaking precautions by attempting to twist to reach items. Pt also with good carryover of transporting items with min cues for utilizing walker bag. After cooking task, pt ambulated to bathroom and completed toilet task at supervision level. Pt returned to room and left with all needs in reach. Daughter very pleased with pt's progress this week.   Therapy Documentation Precautions:  Precautions Precautions: Fall;Back Precaution Comments: Back/Spinal precautions Required Braces  or Orthoses: Spinal Brace Spinal Brace: Thoracolumbosacral orthotic;Applied in sitting position Restrictions Weight Bearing Restrictions: No General:   Vital Signs: Therapy Vitals Temp: 98.2 F (36.8 C) Temp src: Oral Pulse Rate: 94 Resp: 18 BP: 149/76 mmHg Patient Position, if appropriate: Lying Oxygen Therapy SpO2: 94 % O2 Device: None (Room air) Pain: Pain Assessment Pain Score: 0-No pain  See FIM for current functional status  Therapy/Group: Individual Therapy  Daneil Danerkinson, Taggart Prasad N 12/22/2013, 8:24 AM

## 2013-12-22 NOTE — Progress Notes (Addendum)
Physical Therapy Session Note  Patient Details  Name: Cheyenne Frank MRN: [id number] Date of Birth: 08/24/1941  Today's Date: 12/22/2013 Time: 0900-1000 Time Calculation (min): 60 min  Short Term Goals: Week 2:  PT Short Term Goal 1 (Week 2): STG's=LTG's secondary to ELOS  Skilled Therapeutic Interventions/Progress Updates:    Pt received seated in w/c; agreeable to session. Donned TLSO in seated. Per pt request, performed toilet transfer with rolling walker and grab bars, supervision. Performed gait x160' in controlled environment with rolling walker and supervision. Multiple sit<>stand transfers from w/c, mat table with rolling walker and supervision, subtle to min verbal cueing (~20% of transfers) for safe hand placement. Seated: LUE anterolateral reaching (while adhering to spinal precautions) to elicit R quadratus lumborum activation, R innominate elevation during RLE swing phase of gait. Per pt report of visual disturbance during gait, performed brief oculomotor exam, which was suggestive of abnormal VOR cancellation, impaired horizontal saccades. Explained, demonstrated, and provided paper handout for home exercise program addressing saccades (focus on horizontal) 2x30 seconds, VOR x1 viewing (horziontal head turns) 4x15 reps to pt tolerance.  Pt gave effective return demonstration. Session ended in pt room, where pt was left seated in w/c with all needs within reach. Pt consistently adhered to spinal precautions during all aspects of functional mobility without cueing during this session.  Therapy Documentation Precautions:  Precautions Precautions: Fall;Back Precaution Comments: Back/Spinal precautions Required Braces or Orthoses: Spinal Brace Spinal Brace: Thoracolumbosacral orthotic;Applied in sitting position Restrictions Weight Bearing Restrictions: No Vital Signs: Therapy Vitals Temp: 97.9 F (36.6 C) Temp src: Oral Pulse Rate: 84 Resp: 18 BP: 122/75 mmHg Patient Position,  if appropriate: Lying Oxygen Therapy SpO2: 91 % O2 Device: None (Room air) Pain: Pain Assessment Pain Assessment: No/denies pain  See FIM for current functional status  Therapy/Group: Individual Therapy  Sheilia Reznick, Lorenda IshiharaBlair A 12/22/2013, 5:22 PM

## 2013-12-22 NOTE — Progress Notes (Signed)
The skilled treatment note has been reviewed and SLP is in agreement.  Bethzaida Boord, M.A., CCC-SLP  [phone]   

## 2013-12-22 NOTE — Progress Notes (Signed)
[place] PHYSICAL MEDICINE & REHABILITATION     PROGRESS NOTE    Subjective/Complaints: Slept well, no problems in therapies A 12 point review of systems has been performed and if not noted above is otherwise negative.  Objective: Vital Signs: Blood pressure 149/76, pulse 94, temperature 98.2 F (36.8 C), temperature source Oral, resp. rate 18, weight 94.7 kg (208 lb 12.4 oz), SpO2 94.00%. No results found.  Recent Labs  12/20/13 0924  WBC 7.6  HGB 9.5*  HCT 31.1*  PLT 257   No results found for this basename: NA, K, CL, CO, GLUCOSE, BUN, CREATININE, CALCIUM,  in the last 72 hours CBG (last 3)   Recent Labs  12/21/13 1126 12/21/13 1717 12/21/13 2054  GLUCAP 162* 165* 200*    Wt Readings from Last 3 Encounters:  12/16/13 94.7 kg (208 lb 12.4 oz)  04/06/13 104.327 kg (230 lb)  04/06/13 104.327 kg (230 lb)    Physical Exam:  Constitutional: . She appears well-developed and well-nourished.  HENT:  Head: Normocephalic , healing incision L Temp Right Ear: External ear normal.  Left Ear: External ear normal.  Eyes: Conjunctivae and EOM are normal. Pupils are equal, round, and reactive to light.  Neck: . Neck supple. No JVD present. No tracheal deviation present.   Cardiovascular: Normal rate and regular rhythm. Exam reveals no gallop and no friction rub.  No murmur heard.  Pulmonary/Chest: Effort normal and breath sounds normal. No respiratory distress. She has no wheezes. She has no rales. She exhibits no tenderness.  Abdominal: Soft.Midline infraumbilical incision CDI Bowel sounds are normal. She exhibits no distension. There is no tenderness. There is no rebound.  Skin-thoraco- lumbar incision closed no erythema Musculoskeletal: She exhibits no edema and no tenderness.  Lymphadenopathy:  She has no cervical adenopathy.  Neurological: sleeping  Assessment/Plan: 1. 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  which require 3+ hours per day of interdisciplinary therapy in a comprehensive inpatient rehab setting. Physiatrist is providing close team supervision and 24 hour management of active medical problems listed below. Physiatrist and rehab team continue to assess barriers to discharge/monitor patient progress toward functional and medical goals. FIM: FIM - Bathing Bathing Steps Patient Completed: Chest;Right Arm;Left Arm;Abdomen;Right upper leg;Left upper leg;Left lower leg (including foot);Right lower leg (including foot);Front perineal area;Buttocks Bathing: 4: Steadying assist  FIM - Upper Body Dressing/Undressing Upper body dressing/undressing steps patient completed: Thread/unthread right sleeve of pullover shirt/dresss;Thread/unthread left sleeve of pullover shirt/dress;Put head through opening of pull over shirt/dress;Pull shirt over trunk Upper body dressing/undressing: 5: Set-up assist to: Apply TLSO, cervical collar FIM - Lower Body Dressing/Undressing Lower body dressing/undressing steps patient completed: Thread/unthread right underwear leg;Thread/unthread left underwear leg;Pull underwear up/down;Thread/unthread right pants leg;Thread/unthread left pants leg;Pull pants up/down;Don/Doff right shoe;Don/Doff left shoe Lower body dressing/undressing: 5: Supervision: Safety issues/verbal cues  FIM - Toileting Toileting steps completed by patient: Adjust clothing prior to toileting;Performs perineal hygiene;Adjust clothing after toileting Toileting Assistive Devices: Grab bar or rail for support Toileting: 5: Supervision: Safety issues/verbal cues  FIM - Diplomatic Services operational officer Devices: Grab bars;Walker Toilet Transfers: 5-To toilet/BSC: Supervision (verbal cues/safety issues);4-From toilet/BSC: Min A (steadying Pt. > 75%)  FIM - Banker Devices: Orthosis;Walker;Arm rests (TLSO) Bed/Chair Transfer:  5: Sit > Supine: Supervision (verbal cues/safety issues);5: Bed > Chair or W/C: Supervision (verbal cues/safety issues);5: Chair or W/C > Bed: Supervision (verbal cues/safety issues);5: Supine > Sit: Supervision (verbal  cues/safety issues)  FIM - Locomotion: Wheelchair Locomotion: Wheelchair: 0: Activity did not occur FIM - Locomotion: Ambulation Locomotion: Ambulation Assistive Devices: Walker - Rolling;Orthosis;Other (comment) (TLSO) Ambulation/Gait Assistance: 4: Min guard Locomotion: Ambulation: 4: Travels 150 ft or more with minimal assistance (Pt.>75%)  Comprehension Comprehension Mode: Auditory Comprehension: 5-Follows basic conversation/direction: With extra time/assistive device  Expression Expression Mode: Verbal Expression: 5-Expresses complex 90% of the time/cues < 10% of the time  Social Interaction Social Interaction: 6-Interacts appropriately with others with medication or extra time (anti-anxiety, antidepressant).  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 DVT Prophylaxis/Anticoagulation: SQ Lovenox.  2. Pain Management: prn tylenol for now.  3. Mood: Seems blunted. Provide ego support. LCSW to follow for evaluation and support.  4. Neuropsych: This patient is capable of making decisions on her own behalf.  5. Lumbar laminectomy with post op I and D for infection: To continues on [facility]  6. SDH s/p evacuation: On keppra bid for seizures/seizure prophylaxis.  7. DM type 2: Will monitor with ac/hs checks. Continue levemir at bedtime with SSI for elevated BS.   -reasonable control at present 8. OSA with hypercarbia: To continue CPAP. Not always compliant 9. Neutropenia: Question etiology. Diff pending.   10 Anemia of chronic disease/iron deficiency: Added iron supplement. hgb 8.7 11. CV- bp's a little better   -prinivil still on hold.   to Continue coreg  -encourage fluids  TEDS.  LOS (Days) 11 A FACE TO FACE EVALUATION WAS PERFORMED  Erick ColaceKIRSTEINS,ANDREW E 12/22/2013 6:24 AM

## 2013-12-22 NOTE — Progress Notes (Signed)
Speech Language Pathology Daily Session Note  Patient Details  Name: Cheyenne Frank MRN: [id number] Date of Birth: 07/10/1941  Today's Date: 12/22/2013 Time: 1315-1400 Time Calculation (min): 45 min  Short Term Goals: Week 2: SLP Short Term Goal 1 (Week 2): Patient will utilize strategies to increase word finding ability at the phrase level with supervision level cueing.  SLP Short Term Goal 2 (Week 2): Pt will demonstrate basic problem solving and safety awareness to utilize call bell as needed with Min cues SLP Short Term Goal 3 (Week 2): Pt will utilize external memory aids to recall new anddaily information with supervision level cueing  Skilled Therapeutic Interventions: Skilled therapeutic intervention addressed linguistic goal. Student provided Min multimodal cues to utilize word finding strategies and for recall in a structured category naming task. Continue with current plan of care.    FIM:  Comprehension Comprehension Mode: Auditory Comprehension: 5-Understands basic 90% of the time/requires cueing < 10% of the time Expression Expression Mode: Verbal Expression: 4-Expresses basic 75 - 89% of the time/requires cueing 10 - 24% of the time. Needs helper to occlude trach/needs to repeat words. Social Interaction Social Interaction: 6-Interacts appropriately with others with medication or extra time (anti-anxiety, antidepressant). Problem Solving Problem Solving: 5-Solves basic problems: With no assist Memory Memory: 4-Recognizes or recalls 75 - 89% of the time/requires cueing 10 - 24% of the time FIM - Eating Eating Activity: 0: Activity did not occur  Pain Pain Assessment Pain Assessment: No/denies pain  Therapy/Group: Individual Therapy  Levester Waldridge 12/22/2013, 3:49 PM

## 2013-12-23 ENCOUNTER — Inpatient Hospital Stay (HOSPITAL_COMMUNITY): Payer: Medicare Other

## 2013-12-23 ENCOUNTER — Inpatient Hospital Stay (HOSPITAL_COMMUNITY): Payer: Medicare Other | Admitting: Physical Therapy

## 2013-12-23 ENCOUNTER — Inpatient Hospital Stay (HOSPITAL_COMMUNITY): Payer: Medicare Other | Admitting: Speech Pathology

## 2013-12-23 DIAGNOSIS — S065XAA Traumatic subdural hemorrhage with loss of consciousness status unknown, initial encounter: Secondary | ICD-10-CM

## 2013-12-23 DIAGNOSIS — E119 Type 2 diabetes mellitus without complications: Secondary | ICD-10-CM

## 2013-12-23 DIAGNOSIS — D509 Iron deficiency anemia, unspecified: Secondary | ICD-10-CM

## 2013-12-23 DIAGNOSIS — S065X9A Traumatic subdural hemorrhage with loss of consciousness of unspecified duration, initial encounter: Secondary | ICD-10-CM

## 2013-12-23 DIAGNOSIS — I6350 Cerebral infarction due to unspecified occlusion or stenosis of unspecified cerebral artery: Secondary | ICD-10-CM

## 2013-12-23 LAB — GLUCOSE, CAPILLARY
GLUCOSE-CAPILLARY: 92 mg/dL (ref 70–99)
Glucose-Capillary: 129 mg/dL — ABNORMAL HIGH (ref 70–99)
Glucose-Capillary: 204 mg/dL — ABNORMAL HIGH (ref 70–99)
Glucose-Capillary: 136 mg/dL — ABNORMAL HIGH (ref 70–99)

## 2013-12-23 LAB — BASIC METABOLIC PANEL
BUN: 18 mg/dL (ref 6–23)
CALCIUM: 9.4 mg/dL (ref 8.4–10.5)
CO2: 30 meq/L (ref 19–32)
CREATININE: 0.8 mg/dL (ref 0.50–1.10)
Chloride: 102 mEq/L (ref 96–112)
GFR calc Af Amer: 83 mL/min — ABNORMAL LOW (ref >90)
GFR calc non Af Amer: 72 mL/min — ABNORMAL LOW (ref >90)
GLUCOSE: 129 mg/dL — AB (ref 70–99)
Potassium: 4.2 mEq/L (ref 3.7–5.3)
Sodium: 143 mEq/L (ref 137–147)

## 2013-12-23 NOTE — Progress Notes (Signed)
Speech Language Pathology Daily Session Note  Patient Details  Name: Jetty PeeksGeneva Delk MRN: [id number] Date of Birth: 06/30/1941  Today's Date: 12/23/2013 Time: [id number] Time Calculation (min): 30 min  Short Term Goals: Week 2: SLP Short Term Goal 1 (Week 2): Patient will utilize strategies to increase word finding ability at the phrase level with supervision level cueing.  SLP Short Term Goal 2 (Week 2): Pt will demonstrate basic problem solving and safety awareness to utilize call bell as needed with Min cues SLP Short Term Goal 3 (Week 2): Pt will utilize external memory aids to recall new anddaily information with supervision level cueing  Skilled Therapeutic Interventions: Skilled treatment session focused on cognitive-linguistic goals. SLP facilitated session by providing Min-Mod A semantic cues for complex generative naming task. Pt demonstrated emergent awareness into difficulty of task with Mod I. Continue with current plan of care.    FIM:  Comprehension Comprehension Mode: Auditory Comprehension: 5-Understands complex 90% of the time/Cues < 10% of the time Expression Expression Mode: Verbal Expression: 5-Expresses complex 90% of the time/cues < 10% of the time Social Interaction Social Interaction: 6-Interacts appropriately with others with medication or extra time (anti-anxiety, antidepressant). Problem Solving Problem Solving: 5-Solves complex 90% of the time/cues < 10% of the time Memory Memory: 5-Recognizes or recalls 90% of the time/requires cueing < 10% of the time FIM - Eating Eating Activity: 6: More than reasonable amount of time  Pain Pain Assessment Pain Assessment: No/denies pain  Therapy/Group: Individual Therapy  Huston FoleyCourtney M Nichola Warren 12/23/2013, 4:26 PM

## 2013-12-23 NOTE — Progress Notes (Signed)
Pt refuses CPAP 

## 2013-12-23 NOTE — Progress Notes (Signed)
Pt refused cpap at this time.

## 2013-12-23 NOTE — Progress Notes (Signed)
Physical Therapy Discharge Summary  Patient Details  Name: Cheyenne Frank MRN: [id number] Date of Birth: [birth year]  Today's Date: 12/23/2013 Time: 1638-4665 and 9935-7017 Time Calculation (min): 60 min and 47 min  Patient has met 10 of 10 long term goals due to improved activity tolerance, improved balance, improved postural control, increased strength, decreased pain, ability to compensate for deficits, functional use of  right upper extremity and right lower extremity and increased safety awareness.  Patient to discharge at an ambulatory level Modified Independent.   Patient's care partner will not be necessary at discharge, as patient is able to perform all aspects of functional mobility addressed by long term goals at modified independent level.   Reasons goals not met: N/A; all goals met or surpassed.  Recommendation:  Patient will benefit from ongoing skilled PT services in outpatient setting to continue to advance safe functional mobility, address ongoing impairments in postural stability, dynamic standing balance, activity tolerance, and minimize fall risk.  Equipment: w/c and cushion (20"x18")  Reasons for discharge: treatment goals met and discharge from hospital  Patient/family agrees with progress made and goals achieved: Yes  Skilled Therapeutic Interventions/Progress Updates Treatment Session 1: Pt received seated in w/c with TLSO on. Visual aid placed on rolling walker to remind pt of safe hand placement with transfers; pt utilized visual aid throughout session and demonstrated effective carryover of safe hand placement with sit<>stand transfers with mod I. Performed w/c mobility x100' in controlled and home environments with bilat LE's with supervision, subtle to min cueing for R-sided obstacle negotiation; w/c mobility distance limited by LE fatigue. Performed gait 2x160' in controlled and home environments with rolling walker and mod I; gait distance limited by pt fatigue. In  rehab apartment, performed supine<>sit with HOB flat, no rails with mod I, increased time. Performed car transfer with rolling walker x3 trials total. Initial car transfer with supervision, subtle cueing for safe hand placement; remaining 2 transfers with rolling walker and mod I with effective return demonstration of safe hand placement. Session ended in pt room, where pt was left seated in w/c with all needs within reach. Pt made modified independent in room; RN notified and safety plan updated to reflect changes.  Treatment Session 2: Pt received seated in w/. Pt donned TLSO independently in seated. Performed w/c mobility x150' in controlled environment with supervision using bilat LE's. Transported pt to hospital lobby, where pt performed gait x150' in community environment (inside hospital lobby and outdoors) with rolling walker and mod I. Pt safely negotiated obstacles in gift shop during gait trial and performed reaching outside BOS for items and anterior weight shifting with mod I, single UE support at rolling walker. During outdoor gait, pt negotiated standard curb x2 trials with rolling walker and supervision and manual stabilization of rolling walker, min verbal cueing for technique, sequencing with effective return demonstration. Transported pt in w/c to return to unit, where session ended. Therapist departed pt room with pt seated in w/c with all needs within reach.   PT Discharge Precautions/Restrictions Precautions Precautions: Fall;Back Precaution Comments: Back/Spinal precautions Required Braces or Orthoses: Spinal Brace Spinal Brace: Thoracolumbosacral orthotic;Applied in sitting position;Other (comment) Spinal Brace Comments: worn during therapy Restrictions Weight Bearing Restrictions: No Pain Pain Assessment Pain Assessment: No/denies pain Pain Score: 0-No pain Vision/Perception  Vision - Assessment Eye Alignment: Within Functional Limits Ocular Range of Motion: Within  Functional Limits  Cognition Overall Cognitive Status: Impaired/Different from baseline Arousal/Alertness: Awake/alert Orientation Level: Oriented X4 Attention: Sustained Sustained Attention: Appears intact  Memory: Appears intact Awareness: Appears intact Problem Solving: Impaired Problem Solving Impairment: Functional complex Organizing: Appears intact Safety/Judgment: Impaired Sensation Sensation Light Touch: Impaired by gross assessment Stereognosis: Appears Intact Hot/Cold: Appears Intact Proprioception: Appears Intact Additional Comments: Pt reports "tingling" in toes of bilat feet secondary to diabetic neuropathy present PTA, per pt. Coordination Gross Motor Movements are Fluid and Coordinated: Yes Fine Motor Movements are Fluid and Coordinated: Yes Motor  Motor Motor: Hemiplegia Motor - Discharge Observations: head tremor (premorbid)  Mobility Bed Mobility Bed Mobility: Rolling Right;Right Sidelying to Sit;Sitting - Scoot to [employer] of Bed;Rolling Left;Supine to Sit;Sit to Supine Rolling Right: 6: Modified independent (Device/Increase time) Rolling Left: 6: Modified independent (Device/Increase time) Supine to Sit: 6: Modified independent (Device/Increase time);HOB flat Sitting - Scoot to Edge of Bed: 7: Independent Sit to Supine: 6: Modified independent (Device/Increase time);HOB flat Transfers Transfers: Yes Sit to Stand: 6: Modified independent (Device/Increase time);With armrests;From chair/3-in-1;From bed Stand to Sit: 6: Modified independent (Device/Increase time);With armrests;To chair/3-in-1;To bed Stand Pivot Transfers: 6: Modified independent (Device/Increase time);With armrests Stand Pivot Transfer Details (indicate cue type and reason): with rolling walker Locomotion  Ambulation Ambulation: Yes Ambulation/Gait Assistance: 6: Modified independent (Device/Increase time) Ambulation Distance (Feet): 160 Feet Assistive device: Rolling walker Gait Gait:  Yes Gait Pattern: Impaired Gait Pattern: Decreased hip/knee flexion - right;Lateral trunk lean to right;Trendelenburg (With increased fatigue, pt exhibits R hip drop and R trunk lean during RLE swing  phase.) High Level Ambulation High Level Ambulation: Side stepping;Backwards walking Side Stepping: using rolling walker with supervision/cueing for sequencing/technique Backwards Walking: with mod I using rolling walker Stairs / Additional Locomotion Stairs: Yes Stairs Assistance: 6: Modified independent (Device/Increase time) Stair Management Technique: One rail Right;Step to pattern;Sideways Number of Stairs: 12 Ramp: Other (comment);6: Modified independent (Device) (using rolling walker) Curb: 5: Supervision; Other (comment) (using rolling walker) Wheelchair Mobility Wheelchair Mobility: Yes Wheelchair Assistance: 5: Supervision Wheelchair Propulsion: Both lower extermities Wheelchair Parts Management: Needs assistance;Independent;Other (comment) (Mod I with management of w/c brakes with brake lock extenders; requires assist with management of leg rests) Distance: 150  Trunk/Postural Assessment  Cervical Assessment Cervical Assessment: Within Functional Limits Thoracic Assessment Thoracic Assessment: Within Functional Limits Lumbar Assessment Lumbar Assessment: Within Functional Limits Postural Control Postural Control: Deficits on evaluation Postural Limitations: Pt with increased lateral trunk lean to R with static/dynamic standing without rolling walker  Balance Balance Balance Assessed: Yes Static Sitting Balance Static Sitting - Balance Support: No upper extremity supported;Feet unsupported Static Sitting - Level of Assistance: 6: Modified independent (Device/Increase time) Dynamic Sitting Balance Dynamic Sitting - Balance Support: Feet supported;No upper extremity supported Dynamic Sitting - Level of Assistance: 7: Independent Dynamic Sitting - Balance Activities:  Reaching for objects Static Standing Balance Static Standing - Balance Support: Bilateral upper extremity supported Static Standing - Level of Assistance: 6: Modified independent (Device/Increase time) Static Standing - Comment/# of Minutes: >60 seconds with bilat UE support at rolling walker Dynamic Standing Balance Dynamic Standing - Balance Support: During functional activity;No upper extremity supported Dynamic Standing - Level of Assistance: 6: Modified independent (Device/Increase time) Dynamic Standing - Balance Activities: Forward lean/weight shifting;Reaching across midline Dynamic Standing - Comments: Standing at sink, washing hands x3 minutes with intermittent LUE support with mod I. Reaching forward for soap, across midline for paper towels (with effective adherence to spinal precautions) without LOB. Extremity Assessment  RUE Assessment RUE Assessment: Within Functional Limits RUE Strength RUE Overall Strength: Within Functional Limits for tasks performed (grossly 4/5) LUE Assessment LUE Assessment: Within  Functional Limits (grossly 4/5) RLE Assessment RLE Assessment: Exceptions to [facility] RLE Strength RLE Overall Strength: Deficits RLE Overall Strength Comments: Overall, WFL with exception of 3+/5 R hip abduction (as exhibited by L Trendelenberg gait pattern with increased fatigue) LLE Assessment LLE Assessment: Within Functional Limits  See FIM for current functional status  Benjie Karvonen A [name] 12/23/2013, 5:43 PM

## 2013-12-23 NOTE — Progress Notes (Signed)
Occupational Therapy Discharge Summary  Patient Details  Name: Cheyenne Frank MRN: [id number] Date of Birth: 11/25/1940  Today's Date: 12/23/2013 Time: 0730-0829 Time calculation (min): 59 min  Patient has met 12 of 12 long term goals due to improved activity tolerance, improved balance, postural control, ability to compensate for deficits, functional use of  RIGHT upper and RIGHT lower extremity, improved attention, improved awareness and improved coordination.  Patient to discharge at overall supervision to modified independent level.  Patient's care partner is independent to provide the necessary physical and cognitive assistance at discharge.  Patient's daughters have participated in family training/education for IADL and ADL tasks. Both demonstrate the ability to safely provide supervision assistance when needed. Educated on AE and reported patient has all items at home.   Reasons goals not met: N/A All LTGs met.  Recommendation:  No skilled occupational therapy recommended at this time.   Equipment: shower chair, BSC  Reasons for discharge: treatment goals met and discharge from hospital  Patient/family agrees with progress made and goals achieved: Yes  Skilled Therapeutic Intervention Pt seen for ADL retraining with focus on use of AE, functional transfers, standing balance and safety awareness. Pt received supine in bed. Ambulated to bathroom with RW at Mod I to complete toilet task. Pt completed bathing at supervision level with min cues for sitting to use LH sponge for lower BLEs. Completed dressing sitting EOB with supervision for LB dressing as 1 cue provided to use reacher. Pt attempting to don pants without AE however still following precautions. Completed oral care and hair care in standing at sink at Mod I. At end of session pt left sitting in w/c with breakfast and all needs in reach. Pt has been consistently demonstrating no scanning deficits throughout sessions and no  inattention to right noted. Pt with no questions or concerns about discharge at this time.   OT Discharge Precautions/Restrictions  Precautions Precautions: Fall;Back Precaution Comments: Back/Spinal precautions Required Braces or Orthoses: Spinal Brace Spinal Brace: Thoracolumbosacral orthotic;Applied in sitting position;Other (comment) Spinal Brace Comments: worn during therapy General   Vital Signs Therapy Vitals Temp: 98.3 F (36.8 C) Temp src: Oral Pulse Rate: 83 Resp: 18 BP: 120/79 mmHg Patient Position, if appropriate: Lying Oxygen Therapy SpO2: 96 % O2 Device: None (Room air) Pain Pain Assessment Pain Assessment: No/denies pain ADL   Vision/Perception  Vision- History Baseline Vision/History: Wears glasses Wears Glasses: Reading only Patient Visual Report: No change from baseline Vision- Assessment Eye Alignment: Within Functional Limits Ocular Range of Motion: Within Functional Limits  Cognition Overall Cognitive Status: Impaired/Different from baseline Arousal/Alertness: Awake/alert Orientation Level: Oriented X4 Attention: Sustained Sustained Attention: Appears intact Memory: Appears intact Awareness: Appears intact Problem Solving: Impaired Problem Solving Impairment: Functional complex Organizing: Appears intact Safety/Judgment: Impaired Sensation Sensation Light Touch: Appears Intact Hot/Cold: Appears Intact Coordination Gross Motor Movements are Fluid and Coordinated: Yes Fine Motor Movements are Fluid and Coordinated: Yes Motor    Mobility     Trunk/Postural Assessment     Balance   Extremity/Trunk Assessment RUE Assessment RUE Assessment: Within Functional Limits RUE Strength RUE Overall Strength: Within Functional Limits for tasks performed (grossly 4/5) LUE Assessment LUE Assessment: Within Functional Limits (grossly 4/5)  See FIM for current functional status  Erna Brossard N Orlandis Sanden 12/23/2013, 8:25 AM

## 2013-12-23 NOTE — Progress Notes (Signed)
Social Work Patient ID: Cheyenne PeeksGeneva Frank, female   DOB: 07/19/1941, 73 y.o.   MRN: [id number]  Have reviewed team conference with pt/ family.  Ready for d/c tomorrow.  All DME and OP f/u in place.  No concerns.  Amada JupiterLucy Amyla Heffner, LCSW

## 2013-12-23 NOTE — Progress Notes (Signed)
[facility] [facility] PHYSICAL MEDICINE & REHABILITATION     PROGRESS NOTE    Subjective/Complaints: Slept well, no problems in therapies A 12 point review of systems has been performed and if not noted above is otherwise negative.  Objective: Vital Signs: Blood pressure 120/79, pulse 83, temperature 98.3 F (36.8 C), temperature source Oral, resp. rate 18, weight 94.8 kg (208 lb 15.9 oz), SpO2 96.00%. No results found.  Recent Labs  12/20/13 0924  WBC 7.6  HGB 9.5*  HCT 31.1*  PLT 257   No results found for this basename: NA, K, CL, CO, GLUCOSE, BUN, CREATININE, CALCIUM,  in the last 72 hours CBG (last 3)   Recent Labs  12/22/13 1644 12/22/13 2054 12/23/13 0718  GLUCAP 185* 206* 129*    Wt Readings from Last 3 Encounters:  12/23/13 94.8 kg (208 lb 15.9 oz)  04/06/13 104.327 kg (230 lb)  04/06/13 104.327 kg (230 lb)    Physical Exam:  Constitutional: . She appears well-developed and well-nourished.  HENT:  Head: Normocephalic , healing incision L Temp Right Ear: External ear normal.  Left Ear: External ear normal.  Eyes: Conjunctivae and EOM are normal. Pupils are equal, round, and reactive to light.  Neck: . Neck supple. No JVD present. No tracheal deviation present.   Cardiovascular: Normal rate and regular rhythm. Exam reveals no gallop and no friction rub.  No murmur heard.  Pulmonary/Chest: Effort normal and breath sounds normal. No respiratory distress. She has no wheezes. She has no rales. She exhibits no tenderness.  Abdominal: Soft.\Bowel sounds are normal. She exhibits no distension. There is no tenderness. There is no rebound.  Skin-thoraco- lumbar incision closed no erythema Musculoskeletal: She exhibits no edema and no tenderness.  Lymphadenopathy:  She has no cervical adenopathy.  Neurological: sensory intact to LT BUE, 4+ /5 strength on R and 5/5 on Left  Assessment/Plan: 1. 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  which require 3+ hours per day of interdisciplinary therapy in a comprehensive inpatient rehab setting. Physiatrist is providing close team supervision and 24 hour management of active medical problems listed below. Physiatrist and rehab team continue to assess barriers to discharge/monitor patient progress toward functional and medical goals. Team conference today please see physician documentation under team conference tab, met with team face-to-face to discuss problems,progress, and goals. Formulized individual treatment plan based on medical history, underlying problem and comorbidities. FIM: FIM - Bathing Bathing Steps Patient Completed: Chest;Right Arm;Left Arm;Abdomen;Right upper leg;Left upper leg;Left lower leg (including foot);Right lower leg (including foot);Front perineal area Bathing: 4: Min-Patient completes 8-9 [id number] parts or 75+ percent  FIM - Upper Body Dressing/Undressing Upper body dressing/undressing steps patient completed: Thread/unthread right sleeve of pullover shirt/dresss;Thread/unthread left sleeve of pullover shirt/dress;Put head through opening of pull over shirt/dress;Pull shirt over trunk Upper body dressing/undressing: 5: Set-up assist to: Obtain clothing/put away FIM - Lower Body Dressing/Undressing Lower body dressing/undressing steps patient completed: Thread/unthread right underwear leg;Thread/unthread left underwear leg;Pull underwear up/down;Thread/unthread right pants leg;Thread/unthread left pants leg;Pull pants up/down;Don/Doff right shoe;Don/Doff left shoe Lower body dressing/undressing: 5: Set-up assist to: Don/Doff TED stocking  FIM - Toileting Toileting steps completed by patient: Adjust clothing prior to toileting;Performs perineal hygiene;Adjust clothing after toileting Toileting Assistive Devices: Grab bar or rail for support Toileting: 5: Supervision: Safety issues/verbal cues  FIM - TSport and exercise psychologistDevices: Grab bars;Walker Toilet Transfers: 5-To toilet/BSC: Supervision (verbal cues/safety issues);4-From toilet/BSC: Min A (  steadying Pt. > 75%)  FIM - Control and instrumentation engineer Devices: Walker;Arm rests;Orthosis (TLSO) Bed/Chair Transfer: 5: Bed > Chair or W/C: Supervision (verbal cues/safety issues);5: Chair or W/C > Bed: Supervision (verbal cues/safety issues)  FIM - Locomotion: Wheelchair Locomotion: Wheelchair: 0: Activity did not occur FIM - Locomotion: Ambulation Locomotion: Ambulation Assistive Devices: Walker - Rolling;Orthosis;Other (comment) (TLSO) Ambulation/Gait Assistance: 5: Supervision Locomotion: Ambulation: 5: Travels 150 ft or more with supervision/safety issues  Comprehension Comprehension Mode: Auditory Comprehension: 5-Follows basic conversation/direction: With extra time/assistive device  Expression Expression Mode: Verbal Expression: 5-Expresses complex 90% of the time/cues < 10% of the time  Social Interaction Social Interaction: 6-Interacts appropriately with others with medication or extra time (anti-anxiety, antidepressant).  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 DVT Prophylaxis/Anticoagulation: SQ Lovenox.  2. Pain Management: prn tylenol for now.  3. Mood: Seems blunted. Provide ego support. LCSW to follow for evaluation and support.  4. Neuropsych: This patient is capable of making decisions on her own behalf.  5. Lumbar laminectomy with post op I and D for infection: To continues on keflex for a year per NS at [facility].  6. SDH s/p evacuation: On keppra bid for seizures/seizure prophylaxis.  7. DM type 2: Will monitor with ac/hs checks. Continue levemir at bedtime  with SSI for elevated BS.   -reasonable control at present 8. OSA with hypercarbia: To continue CPAP. Not always compliant 9. Neutropenia: Question etiology. Diff pending.  10 Anemia of chronic disease/iron deficiency: Added iron supplement. hgb 8.7 11. CV- bp's controlled  -prinivil still on hold.   to Continue coreg  -encourage fluids  TEDS.  LOS (Days) 12 A FACE TO FACE EVALUATION WAS PERFORMED  Charlett Blake 12/23/2013 7:47 AM

## 2013-12-23 NOTE — Patient Care Conference (Signed)
Inpatient RehabilitationTeam Conference and Plan of Care Update Date: 12/23/2013   Time: 10:45 AM    Patient Name: Cheyenne Frank      Medical Record Number: [id number]  Date of Birth: 02/20/1941 Sex: Female         Room/Bed: 4M02C/4M02C-01 Payor Info: Payor: MEDICARE / Plan: MEDICARE PART A AND B / Product Type: *No Product type* /    Admitting Diagnosis: CVA  Admit Date/Time:  12/11/2013  2:30 PM Admission Comments: No comment available   Primary Diagnosis:  <principal problem not specified> Principal Problem: <principal problem not specified>  Patient Active Problem List   Diagnosis Date Noted  . SDH (subdural hematoma) 12/11/2013  . Postoperative wound infection--lumbar spine/2015 12/11/2013  . CVA (cerebral infarction) 12/11/2013  . Postoperative anemia due to acute blood loss 04/09/2013  . Diabetes 04/07/2013  . Hypertension 04/07/2013  . Unspecified sleep apnea 04/07/2013  . OA (osteoarthritis) of knee 04/06/2013    Expected Discharge Date: Expected Discharge Date: 12/24/13  Team Members Present: Physician leading conference: Dr. Claudette LawsAndrew Kirsteins Social Worker Present: Staci AcostaJenny Prevatt, LCSW;Sheyli Horwitz ButlerHoyle, [place] Nurse Present: Ronny BaconWhitney Reardon, RN PT Present: Jorje GuildBlair [name], PT OT Present: Scherrie NovemberKayla Perkinson, OT SLP Present: Fae PippinMelissa Bowie, SLP PPS Coordinator present : Edson SnowballBecky Windsor, Chapman FitchPT;Marie Noel, RN, CRRN     Current Status/Progress Goal Weekly Team Focus  Medical   back pain improved, still has some word finding issues  maintain stability  D/C planning   Bowel/Bladder   cont of bowel, senna 1 tab BID, LBM 4/6; urgency incontinence of bladder, uses brief at night and BSC, at least 1 accident at night.  cont of bowel and bladder, urgency controlled; bowel movement qday-q3days  offer toileting to avoid urgency, monitor for constipation, may want to increase senna s to 2 tabs BID   Swallow/Nutrition/ Hydration             ADL's   min-supervision bathing, supervision dressing,  toiletting, and transfers  supervision overall and Mod I toilet task and transfer  adherence to precautions, functional transfers, UE strengthening, family/pt education, activity tolerance, standing balance   Mobility   Supervision with transfers and gait; supervision to min guard with stair negotiation     Postural/gait stability; stair negotiation; D/C planning; pt and family education/training; LE strengthening; dynamic standing balance   Communication   Min cues for word-finding in structured tasks, Mod I for communicating basic wants/needs  Min  increase use of word-finding strategies, education and d/c planning   Safety/Cognition/ Behavioral Observations  supervision-Min  supervision-Min  use of memory aids, basic problem solving, education and plan for d/c   Pain   n/a uses k-pad occasionally for back  remain pain free  assess pain qshift and prn   Skin   back incision OTA, healed, pink, peeling skin; abdominal incision OTA, healed, pink, peeling skin  incision heals with no infection and no skin breakdown  assess skin qshift and prn, keep skin dry    Rehab Goals Patient on target to meet rehab goals: Yes *See Care Plan and progress notes for long and short-term goals.  Barriers to Discharge: none, Mod I in rm today, fam training    Possible Resolutions to Barriers:  [place] outpt    Discharge Planning/Teaching Needs:  Home with daughter needs to be supervision level-due to daughter has MS.  Supportive family      Team Discussion:  Incisions healing and decrease in urinary urgency.  Made mod i in room today and readying for d/c tomorrow.  Using visual aids well but still requires minimal cues with word finding.  Revisions to Treatment Plan:  None   Continued Need for Acute Rehabilitation Level of Care: The patient requires daily medical management by a physician with specialized training in physical medicine and rehabilitation for the following conditions: Daily direction  of a multidisciplinary physical rehabilitation program to ensure safe treatment while eliciting the highest outcome that is of practical value to the patient.: Yes Daily medical management of patient stability for increased activity during participation in an intensive rehabilitation regime.: Yes Daily analysis of laboratory values and/or radiology reports with any subsequent need for medication adjustment of medical intervention for : Neurological problems  Amada Jupiter 12/23/2013, 6:47 PM

## 2013-12-24 ENCOUNTER — Encounter (HOSPITAL_COMMUNITY): Payer: Medicare Other | Admitting: Speech Pathology

## 2013-12-24 LAB — GLUCOSE, CAPILLARY: GLUCOSE-CAPILLARY: 123 mg/dL — AB (ref 70–99)

## 2013-12-24 MED ORDER — PAROXETINE HCL 20 MG PO TABS: 20.0000 mg | ORAL_TABLET | Freq: Every morning | ORAL | Status: DC

## 2013-12-24 MED ORDER — CEPHALEXIN 500 MG PO CAPS: 500.0000 mg | ORAL_CAPSULE | Freq: Three times a day (TID) | ORAL | Status: DC

## 2013-12-24 MED ORDER — TRAZODONE HCL 50 MG PO TABS: 50.0000 mg | ORAL_TABLET | Freq: Every day | ORAL | Status: DC

## 2013-12-24 MED ORDER — CARVEDILOL 3.125 MG PO TABS: 3.1250 mg | ORAL_TABLET | Freq: Two times a day (BID) | ORAL | Status: DC

## 2013-12-24 MED ORDER — POLYSACCHARIDE IRON COMPLEX 150 MG PO CAPS: 150.0000 mg | ORAL_CAPSULE | Freq: Two times a day (BID) | ORAL | Status: DC

## 2013-12-24 MED ORDER — ASCORBIC ACID 500 MG PO TABS: 500.0000 mg | ORAL_TABLET | Freq: Every day | ORAL | Status: DC

## 2013-12-24 MED ORDER — INSULIN GLARGINE 100 UNIT/ML ~~LOC~~ SOLN: 24.0000 [IU] | Freq: Every day | SUBCUTANEOUS | Status: DC

## 2013-12-24 MED ORDER — ZINC SULFATE 220 (50 ZN) MG PO CAPS: 220.0000 mg | ORAL_CAPSULE | Freq: Every day | ORAL | Status: DC

## 2013-12-24 MED ORDER — LEVETIRACETAM 750 MG PO TABS
1500.0000 mg | ORAL_TABLET | Freq: Two times a day (BID) | ORAL | Status: DC
Start: 2013-12-24 — End: 2014-02-15

## 2013-12-24 MED ORDER — ADULT MULTIVITAMIN W/MINERALS CH: 1.0000 | ORAL_TABLET | Freq: Every day | ORAL | Status: DC

## 2013-12-24 NOTE — Progress Notes (Signed)
Speech Language Pathology Session Note & Discharge Summary  Patient Details  Name: Cheyenne Frank MRN: [id number] Date of Birth: 08/26/1941  Today's Date: 12/24/2013 Time: 1025-1033 Time Calculation (min): 8 min  Skilled Therapeutic Interventions:  Skilled treatment session focused on patient education with patient's daughters. Both daughters verbalized and demonstrated understanding of patient's current cognitive-linguistic impairments and strategies to utilize at home to increase word-finding, memory, problem solving and overall safety. Both reported no questions at this time due to being present for majority of treatment sessions. Pt will discharge home today with family.   Patient has met 4 of 4 long term goals.  Patient to discharge at overall Supervision;Min level.   Reasons goals not met: N/A   Clinical Impression/Discharge Summary: Pt has made functional gains and has met 4 of 4 LTG's this admission due to increased utilization of word-finding strategies, working memory with use of external aids, attention and functional problem solving. Pt/family education complete and will discharge home at an overall Min A-supervision level for cognitive-linguistic function. Pt will discharge home today with supervision from family. Pt would benefit from skilled outpatient SLP intervention to maximize cognitive-linguistic function and overall functional independence to reduce caregiver burden.   Care Partner:  Caregiver Able to Provide Assistance: Yes  Type of Caregiver Assistance: Cognitive  Recommendation:  24 hour supervision/assistance;Outpatient SLP  Rationale for SLP Follow Up: Maximize functional communication;Maximize cognitive function and independence;Reduce caregiver burden   Equipment: N/A   Reasons for discharge: Treatment goals met;Discharged from hospital   Patient/Family Agrees with Progress Made and Goals Achieved: Yes   See FIM for current functional status  Buzzy Han 12/24/2013, 10:42 AM

## 2013-12-24 NOTE — Discharge Instructions (Signed)
Inpatient Rehab Discharge Instructions  Jetty PeeksGeneva Giannotti Discharge date and time: 12/23/13   Activities/Precautions/ Functional Status: Activity: activity as tolerated Diet: diabetic diet Wound Care: keep wound clean and dry  Functional status:  ___ No restrictions     ___ Walk up steps independently ___ 24/7 supervision/assistance   ___ Walk up steps with assistance ___ Intermittent supervision/assistance  ___ Bathe/dress independently ___ Walk with walker     ___ Bathe/dress with assistance ___ Walk Independently    ___ Shower independently ___ Walk with assistance    ___ Shower with assistance _X__ No alcohol     ___ Return to work/school ________   COMMUNITY REFERRALS UPON DISCHARGE:    Outpatient: PT     OT    ST                  Agency: [facility]      Phone:  [phone]                          Appointment Date/Time:  Medical Equipment/Items Ordered:  Wheelchair, cushion, 3n1 commode and tub seat                                                      Agency/Supplier:  [facility] @ [phone]      Special Instructions:    My questions have been answered and I understand these instructions. I will adhere to these goals and the provided educational materials after my discharge from the hospital.  Patient/Caregiver Signature _______________________________ Date __________  Clinician Signature _______________________________________ Date __________  Please bring this form and your medication list with you to all your follow-up doctor's appointments.

## 2013-12-24 NOTE — Discharge Summary (Signed)
Physician Discharge Summary  Patient ID: Cheyenne Frank MRN: [id number] DOB/AGE: Sep 11, 1941 73 y.o.  Admit date: 12/11/2013 Discharge date: 12/24/2013  Discharge Diagnoses:  Principal Problem:   CVA (cerebral infarction) Active Problems:   Diabetes   Postoperative anemia due to acute blood loss   SDH (subdural hematoma)   Postoperative wound infection--lumbar spine/2015   Discharged Condition: Stable   Labs:  Basic Metabolic Panel:  Recent Labs Lab 12/23/13 0705  NA 143  K 4.2  CL 102  CO2 30  GLUCOSE 129*  BUN 18  CREATININE 0.80  CALCIUM 9.4    CBC    Component Value Date/Time   WBC 7.6 12/20/2013 0924   RBC 3.62* 12/20/2013 0924   HGB 9.5* 12/20/2013 0924   HCT 31.1* 12/20/2013 0924   PLT 257 12/20/2013 0924   MCV 85.9 12/20/2013 0924   MCH 26.2 12/20/2013 0924   MCHC 30.5 12/20/2013 0924   RDW 16.3* 12/20/2013 0924   LYMPHSABS 1.1 12/18/2013 0618   MONOABS 0.9 12/18/2013 0618   EOSABS 0.1 12/18/2013 0618   BASOSABS 0.0 12/18/2013 0618     CBG:  Recent Labs Lab 12/23/13 0718 12/23/13 1111 12/23/13 1641 12/23/13 1949 12/24/13 0721  GLUCAP 129* 204* 92 136* 123*    Brief HPI:   Cheyenne Frank is a 73 year old female with history of OSA, HTN, and L5-S1 decompression with T10-S1 fusion on 10/06/13 at [facility]. 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 She was admitted to [facility] on 11/05/13 to 12/08/13 for  IV antibiotics as well as wound management. She was discharged to SNF for a week but admitted to [facility] with fall on 12/09/13 due to dizziness and dehydration. She was treated with IVF with improvement. , she attempted to ambulate without assistance and sustained a fall due to dizziness. She was admitted to [facility] for work up and Work up completed at [facility] and she was admitted to [facility] for a more aggressive therapy.      Hospital Course: Cheyenne Frank was admitted to rehab 12/11/2013 for inpatient therapies to consist of PT, ST and OT at least three hours five days a week. Past admission physiatrist, therapy team and rehab RN have worked together to provide customized collaborative inpatient rehab. She was maintained on Keflex for wound prophylaxis. Blood pressures were monitored on bid basis and she was noted to be orthostatic therefore lisinopril was discontinued. UCS revealed pseudomonas UTI and she was treated with 7 day course of Cipro. Diabetes was monitored with ac/hs checks and BS have been reasonably controlled. She is to resume Levemir at discharge. Iron supplement was added for anemia. Her abdominal and back incisions have healed well without signs or symptoms of infection.  Endurance and mentation has gradually improved and she is independent with use of rolling walker at discharge.     Rehab course: During patient's stay in rehab weekly team conferences were held to monitor patient's progress, set goals and discuss barriers to discharge. Patient has had improvement in activity tolerance, balance, postural control, as well as ability to compensate for deficits. She is has had improvement in functional use RUE/LUE  and RLE/LLE as well as improved awareness Speech therapy has worked on high level cognitive linguistic deficits. She requires min assist to supervision for attention, problem solving and use of external aids for working memory.  She is modified independent for ambulating 160' with RW. She is able to perform bathing and dressing tasks with extra time as well as  Supervision. Family education was done with daughter who will assist as needed past discharge.    Disposition: 01-Home or Self Care   Diet: Diabetic diet.    Special Instructions: 1. Check blood sugars before meals and at bedtime.  Bedtime snack if BS < 150. 2. Use SSI for elevated BS.  3. No Driving till cleared by NS. Continue to  wear brace with exertional activity.        Future Appointments Provider Department Dept Phone   02/01/2014 1:30 PM Erick ColaceAndrew E Kirsteins, MD Dr. Claudette LawsAndrew KirsteinsSt [facility] [phone]       Medication List    STOP taking these medications       docusate sodium 100 MG capsule  Commonly known as:  COLACE     oxyCODONE 5 MG immediate release tablet  Commonly known as:  Oxy IR/ROXICODONE      TAKE these medications       acetaminophen 500 MG tablet  Commonly known as:  TYLENOL  Take 1,000 mg by mouth every 6 (six) hours as needed for pain.     ascorbic acid 500 MG tablet  Commonly known as:  VITAMIN C  Take 1 tablet (500 mg total) by mouth daily.     aspirin 325 MG EC tablet  Take 325 mg by mouth daily.     carvedilol 3.125 MG tablet  Commonly known as:  COREG  Take 1 tablet (3.125 mg total) by mouth 2 (two) times daily with a meal.     cephALEXin 500 MG capsule  Commonly known as:  KEFLEX  Take 1 capsule (500 mg total) by mouth every 8 (eight) hours.     gabapentin 400 MG capsule  Commonly known as:  NEURONTIN  Take 400 mg by mouth 3 (three) times daily.     insulin glargine 100 UNIT/ML injection  Commonly known as:  LANTUS  Inject 0.24 mLs (24 Units total) into the skin at bedtime.     insulin lispro 100 UNIT/ML injection  Commonly known as:  HUMALOG  Inject 0-10 Units into the skin 3 (three) times daily before meals. 0-150 = 0 units, 151-200=2 units, 201-250=4 units, 251-300=6 units, 301-350=8 units, 10 units if FSBS 351Comment exceeds the 0 character limit     iron polysaccharides 150 MG capsule  Commonly known as:  NIFEREX  Take 1 capsule (150 mg total) by mouth 2 times daily at 12 noon and 4 pm.     levETIRAcetam 750 MG tablet  Commonly known as:  KEPPRA  Take 2 tablets (1,500 mg total) by mouth 2 (two) times daily.     multivitamin with minerals Tabs tablet  Take 1 tablet by mouth daily.     pantoprazole 40 MG tablet  Commonly known as:  PROTONIX   Take 40 mg by mouth daily.     PARoxetine 20 MG tablet  Commonly known as:  PAXIL  Take 1 tablet (20 mg total) by mouth every morning.     polyethylene glycol packet  Commonly known as:  MIRALAX / GLYCOLAX  Take 17 g by mouth daily as needed.     traZODone 50 MG tablet  Commonly known as:  DESYREL  Take 1 tablet (50 mg total) by mouth at bedtime.     zinc sulfate 220 MG capsule  Take 1 capsule (220 mg total) by mouth daily.  Follow-up Information   Follow up with Rolm Gala, MD On 01/06/2014. (@ 1:20 pm)    Specialty:  Family Medicine   Contact information:   [address] [place] [address] [phone]       Follow up with Erick Colace, MD On 02/01/2014. (Be there at 1pm   for 1:30   appointment)    Specialty:  Physical Medicine and Rehabilitation   Contact information:   [address] [place] [address] [place] [address] [phone]       Signed: Jacquelynn Cree 12/24/2013, 12:06 PM

## 2013-12-24 NOTE — Progress Notes (Addendum)
Cheyenne Frank PHYSICAL MEDICINE & REHABILITATION     PROGRESS NOTE    Subjective/Complaints: Slept well, no problems in therapies, aware of D/C date today A 12 point review of systems has been performed and if not noted above is otherwise negative.  Objective: Vital Signs: Blood pressure 116/74, pulse 79, temperature 97.6 F (36.4 C), temperature source Oral, resp. rate 18, weight 94.8 kg (208 lb 15.9 oz), SpO2 94.00%. No results found. No results found for this basename: WBC, HGB, HCT, PLT,  in the last 72 hours  Recent Labs  12/23/13 0705  NA 143  K 4.2  CL 102  GLUCOSE 129*  BUN 18  CREATININE 0.80  CALCIUM 9.4   CBG (last 3)   Recent Labs  12/23/13 1111 12/23/13 1641 12/23/13 1949  GLUCAP 204* 92 136*    Wt Readings from Last 3 Encounters:  12/23/13 94.8 kg (208 lb 15.9 oz)  04/06/13 104.327 kg (230 lb)  04/06/13 104.327 kg (230 lb)    Physical Exam:  Constitutional: . She appears well-developed and well-nourished.  HENT:  Head: Normocephalic , healing incision L Temp Right Ear: External ear normal.  Left Ear: External ear normal.  Eyes: Conjunctivae and EOM are normal. Pupils are equal, round, and reactive to light.  Neck: . Neck supple. No JVD present. No tracheal deviation present.   Cardiovascular: Normal rate and regular rhythm. Exam reveals no gallop and no friction rub.  No murmur heard.  Pulmonary/Chest: Effort normal and breath sounds normal. No respiratory distress. She has no wheezes. She has no rales. She exhibits no tenderness.  Abdominal: Soft.\Bowel sounds are normal. She exhibits no distension. There is no tenderness. There is no rebound.  Skin-thoraco- lumbar incision closed no erythema Musculoskeletal: She exhibits no edema and no tenderness.  Lymphadenopathy:  She has no cervical adenopathy.  Neurological: sensory intact to LT BUE, 4+ /5 strength on R and 5/5 on Left  Assessment/Plan: 1. 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  Stable for D/C today F/u PCP in 1-2 weeks F/u PM&R 3 weeks See D/C summary See D/C instructions FIM: FIM - Bathing Bathing Steps Patient Completed: Chest;Right Arm;Left Arm;Abdomen;Right upper leg;Left upper leg;Left lower leg (including foot);Right lower leg (including foot);Front perineal area;Buttocks Bathing: 5: Supervision: Safety issues/verbal cues  FIM - Upper Body Dressing/Undressing Upper body dressing/undressing steps patient completed: Thread/unthread right sleeve of pullover shirt/dresss;Thread/unthread left sleeve of pullover shirt/dress;Put head through opening of pull over shirt/dress;Pull shirt over trunk Upper body dressing/undressing: 6: More than reasonable amount of time FIM - Lower Body Dressing/Undressing Lower body dressing/undressing steps patient completed: Thread/unthread right underwear leg;Thread/unthread left underwear leg;Pull underwear up/down;Thread/unthread right pants leg;Thread/unthread left pants leg;Pull pants up/down;Don/Doff right shoe;Don/Doff left shoe;Don/Doff left sock;Don/Doff right sock Lower body dressing/undressing: 5: Set-up assist to: Obtain clothing  FIM - Toileting Toileting steps completed by patient: Adjust clothing prior to toileting;Performs perineal hygiene;Adjust clothing after toileting Toileting Assistive Devices: Grab bar or rail for support Toileting: 7: Independent: No helper, no device  FIM - Diplomatic Services operational officerToilet Transfers Toilet Transfers Assistive Devices: Art gallery managerWalker Toilet Transfers: 6-More than reasonable amt of time;6-From toilet/BSC  FIM - BankerBed/Chair Transfer Bed/Chair Transfer Assistive Devices: Environmental consultantWalker;Arm rests;Orthosis (TLSO) Bed/Chair Transfer: 6: Chair or W/C > Bed: No assist;6: Bed > Chair or W/C: No assist  FIM - Locomotion: Wheelchair Distance: 150 Locomotion: Wheelchair: 5: Travels 150 ft or more: maneuvers on rugs and over door sills with supervision,  cueing or  coaxing FIM - Locomotion: Ambulation Locomotion: Ambulation Assistive Devices: Walker - Rolling;Orthosis;Other (comment) (TLSO) Ambulation/Gait Assistance: 6: Modified independent (Device/Increase time) Locomotion: Ambulation: 6: Travels 150 ft or more with assistive device/no helper  Comprehension Comprehension Mode: Auditory Comprehension: 5-Understands complex 90% of the time/Cues < 10% of the time  Expression Expression Mode: Verbal Expression: 5-Expresses complex 90% of the time/cues < 10% of the time  Social Interaction Social Interaction: 6-Interacts appropriately with others with medication or extra time (anti-anxiety, antidepressant).  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 DVT Prophylaxis/Anticoagulation: SQ Lovenox.  2. Pain Management: prn tylenol for now.  3. Mood: Seems blunted. Provide ego support. LCSW to follow for evaluation and support.  4. Neuropsych: This patient is capable of making decisions on her own behalf.  5. Lumbar laminectomy with post op I and D for infection: To continues on keflex for a year per NS at [facility].  6. SDH s/p evacuation: On keppra bid for seizures/seizure prophylaxis.  7. DM type 2: Will monitor with ac/hs checks. Continue levemir at bedtime with SSI for elevated BS.   -reasonable control at present 8. OSA with hypercarbia: To continue CPAP. Not always compliant 9. Neutropenia: Question etiology. Diff pending.  10 Anemia of chronic disease/iron deficiency: Added iron supplement. hgb 8.7 11. CV- bp's controlled  -prinivil still on hold.   to Continue coreg  -encourage fluids  TEDS.  LOS (Days) 13 A FACE TO FACE EVALUATION WAS PERFORMED  Erick Colace 12/24/2013 6:38 AM

## 2013-12-24 NOTE — Progress Notes (Signed)
Social Work  Discharge Note  The overall goal for the admission was met for:   Discharge location: Yes - home with her daughters to provide 24/7 initially  Length of Stay: Yes - 13 days  Discharge activity level: Yes - supervision to mod i overall  Home/community participation: Yes  Services provided included: MD, RD, PT, OT, SLP, RN, TR, Pharmacy and SW  Financial Services: Medicare and Private Insurance: UMR  Follow-up services arranged: Outpatient: PT, OT, ST via [facility], DME: 20x18 lightweight w/c, cushion, 3n1 commode and tub seat via [facility] and Patient/Family has no preference for HH/DME agencies  Comments (or additional information):  Patient/Family verbalized understanding of follow-up arrangements: Yes  Individual responsible for coordination of the follow-up plan: patient and daughters  Confirmed correct DME delivered: Lennart Pall 12/24/2013    Lennart Pall

## 2013-12-24 NOTE — Progress Notes (Signed)
Patient discharged home.  Left floor via wheelchair, escorted by nursing staff and family.  All patient belongings taken with patient.  Patient and family verbalized understanding of discharge instructions, no questions asked.  VSS.  Appears to be in no immediate distress at this time.  Ivylynn Hoppes J Ryatt Corsino, RN  

## 2013-12-29 ENCOUNTER — Encounter: Payer: Self-pay | Admitting: Family Medicine

## 2014-01-15 ENCOUNTER — Encounter: Payer: Self-pay | Admitting: Family Medicine

## 2014-02-01 ENCOUNTER — Encounter: Payer: Self-pay | Admitting: Physical Medicine & Rehabilitation

## 2014-02-01 ENCOUNTER — Ambulatory Visit (HOSPITAL_BASED_OUTPATIENT_CLINIC_OR_DEPARTMENT_OTHER): Payer: Medicare Other | Admitting: Physical Medicine & Rehabilitation

## 2014-02-01 ENCOUNTER — Encounter: Payer: Medicare Other | Attending: Physical Medicine & Rehabilitation

## 2014-02-01 VITALS — BP 133/62 | HR 88 | Resp 14 | Ht 62.0 in | Wt 217.0 lb

## 2014-02-01 DIAGNOSIS — I6350 Cerebral infarction due to unspecified occlusion or stenosis of unspecified cerebral artery: Secondary | ICD-10-CM

## 2014-02-01 DIAGNOSIS — T8149XA Infection following a procedure, other surgical site, initial encounter: Secondary | ICD-10-CM

## 2014-02-01 DIAGNOSIS — I6200 Nontraumatic subdural hemorrhage, unspecified: Secondary | ICD-10-CM

## 2014-02-01 DIAGNOSIS — T8140XA Infection following a procedure, unspecified, initial encounter: Secondary | ICD-10-CM

## 2014-02-01 DIAGNOSIS — E119 Type 2 diabetes mellitus without complications: Secondary | ICD-10-CM | POA: Insufficient documentation

## 2014-02-01 DIAGNOSIS — G4733 Obstructive sleep apnea (adult) (pediatric): Secondary | ICD-10-CM | POA: Insufficient documentation

## 2014-02-01 DIAGNOSIS — I10 Essential (primary) hypertension: Secondary | ICD-10-CM | POA: Insufficient documentation

## 2014-02-01 DIAGNOSIS — IMO0001 Reserved for inherently not codable concepts without codable children: Secondary | ICD-10-CM | POA: Insufficient documentation

## 2014-02-01 DIAGNOSIS — S065X9A Traumatic subdural hemorrhage with loss of consciousness of unspecified duration, initial encounter: Secondary | ICD-10-CM

## 2014-02-01 DIAGNOSIS — S065XAA Traumatic subdural hemorrhage with loss of consciousness status unknown, initial encounter: Secondary | ICD-10-CM

## 2014-02-01 DIAGNOSIS — R5381 Other malaise: Secondary | ICD-10-CM | POA: Insufficient documentation

## 2014-02-01 MED ORDER — TOLTERODINE TARTRATE 2 MG PO TABS: 2.0000 mg | ORAL_TABLET | Freq: Every day | ORAL | Status: DC

## 2014-02-01 NOTE — Progress Notes (Signed)
Subjective:    Patient ID: Cheyenne Frank, female    DOB: 02/13/1941, 73 y.o.   MRN: [id number]  HPI Ms Cheyenne Frank is a 73 year old female with history of OSA, HTN, and L5-S1 decompression with [facility]10[facility]. 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 [facility] to 12/08/13 for  IV antibiotics as well as wound management. She was discharged to SNF for a week but admitted to [facility] with fall on 12/09/13 due to dizziness and dehydration. She was treated with IVF with improvement. , she attempted to ambulate without assistance and sustained a fall due to dizziness. She was admitted to [facility] for work up and [facility] and [facility] a more aggressive therapy Admit date: 12/11/2013 Discharge date: 12/24/2013  Has had increasing weakness mainly in the legs but feels weak all over her body.   [facility]  [facility] Did CT scan of head and back as well as blood cultures which were negative  Reviewed [facility] records on line  Poor sleep  Pain Inventory Average Pain 0 Pain Right Now 0 My pain is no pain  In the last 24 hours, has pain interfered with the following? General activity 0 Relation with others 0 Enjoyment of life 0 What TIME of day is your pain at its worst? no pain Sleep (in general) Poor  Pain is worse with: na Pain improves with: na Relief from Meds: no pain meds  Mobility walk with assistance use a cane how many minutes can you walk? 2 ability to climb steps?  yes do you drive?  no Do you have any goals in this area?  yes  Function retired I need assistance with the following:  dressing, bathing, toileting, meal prep, household duties and  shopping  Neuro/Psych bladder control problems weakness tingling trouble walking dizziness confusion  Prior Studies Any changes since last visit?  yes CT/MRI  Physicians involved in your care Any changes since last visit?  no   Family History  Problem Relation Age of Onset  . COPD Sister     had lung transplant  . COPD Brother    History   Social History  . Marital Status: Unknown    Spouse Name: N/A    Number of Children: N/A  . Years of Education: N/A   Social History Main Topics  . Smoking status: Never Smoker   . Smokeless tobacco: None  . Alcohol Use: No  . Drug Use: No  . Sexual Activity: None   Other Topics Concern  . None   Social History Narrative  . None   Past Surgical History  Procedure Laterality Date  . Tonsillectomy    . Dilation and curettage of uterus    . Abdominal hysterectomy      rt so  . Hernia repair    . Breast reduction surgery    . Cholecystectomy  1990  . Cataracts removed    . Back surgery  2010 / 2011  . 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  Burr hole for subdural hematoma  2015  . Posterior laminectomy / decompression lumbar spine      with T10-S1 fusion   Past Medical History  Diagnosis Date  . Macular degeneration   . Complication of anesthesia     " feel like I'm dying after I wake up"  . Hypertension   . Dysrhythmia     unknown type   . Asthma     no problems since 1999 (after moving into a new home)  . Stroke 2010    verbal aphasia x 30 min - resolved - no problem since then  . Arthritis   . Itching   . GERD (gastroesophageal reflux disease)   . Nocturia   . Hx of transfusion 2011  . Diabetes mellitus without complication   . Sleep apnea     Dx 1990's unable to wear c-pap -  . Anxiety   . Diverticulosis   . Flat back syndrome   . Restless leg syndrome   .  Anemia of chronic disease   . Fibromyalgia   . Chronic airway obstruction    BP 133/62  Pulse 88  Resp 14  Ht 5\' 2"  (1.575 m)  Wt 217 lb (98.431 kg)  BMI 39.68 kg/m2  SpO2 93%  Opioid Risk Score:   Fall Risk Score: High Fall Risk (>13 points) (pt educated on fall risk, brochure given to pt)    Review of Systems  Genitourinary:       Bladder control problem  Neurological: Positive for dizziness and weakness.       Tingling  Psychiatric/Behavioral: Positive for confusion.  All other systems reviewed and are negative.      Objective:   Physical Exam  Nursing note and vitals reviewed. Constitutional: She is oriented to person, place, and time. She appears well-developed.  Obese  HENT:  Head: Normocephalic and atraumatic.  Eyes: Conjunctivae and EOM are normal. Pupils are equal, round, and reactive to light.  Neck: Normal range of motion.  Musculoskeletal:       Right hip: She exhibits decreased range of motion.       Left hip: Normal.  Lumbar spine with reduced range of motion 25% flexion extension and lateral bending  Negative straight leg raising  Neurological: She is alert and oriented to person, place, and time. She has normal strength. She displays tremor. She displays no atrophy. She exhibits normal muscle tone. She displays no seizure activity. Gait abnormal.  Uses a walker to ambulate  Psychiatric: Her mood appears anxious.          Assessment & Plan:  1. Chronic deconditioning after prolonged hospitalizations and now with increasing weakness. Has not had physical therapy since most recent discharge from [facility] a couple weeks ago. I feel the patient has become more deconditioned with the hospitalization and would benefit from additional home health therapy. This is in progress but has not started yet.  We discussed that the patient has had adequate workup of her weakness including CT scan of the head which showed no evidence of recurrent  subdural hematoma or acute infarct. No evidence of hydrocephalus. In addition lumbar MRI demonstrated normal postoperative changes, thoracic MRI demonstrated normal postoperative changes.  We discussed her symptoms as well as her workup in detail. Her daughter was with her. Over half of the 25 min visit was spent counseling and coordinating care.  Follow up with orthopedics in regard to the clicking in your left knee  may require more  Quad strengthening exercises  Followup neurology and asked whether Keppra can be discontinued  Followup with primary care doctor and see whether Paxil needs to be either increased or change to another medication  Return to clinic when necessary

## 2014-02-01 NOTE — Patient Instructions (Signed)
Follow up with orthopedics in regard to the clicking in your left knee  may require more Quad strengthening exercises  Followup neurology and asked whether Keppra can be discontinued  Followup with primary care doctor and see whether Paxil needs to be either increased or change to another medication

## 2014-02-15 ENCOUNTER — Telehealth: Payer: Self-pay

## 2014-02-15 MED ORDER — LEVETIRACETAM 750 MG PO TABS: 1500.0000 mg | ORAL_TABLET | Freq: Two times a day (BID) | ORAL | Status: DC

## 2014-02-15 NOTE — Telephone Encounter (Signed)
May refill Keppra 750mg  2 tab BID #120 with 1 RF, needs followup with neurology at [facility] to determine duration of therapy

## 2014-02-15 NOTE — Telephone Encounter (Signed)
Patient needs a refill on her keppra 750.  Refill can go to [facility] in [place].

## 2014-02-15 NOTE — Telephone Encounter (Signed)
Patient advised Keppra refilled.  She does have a follow up appointment with her neurologist at [facility].

## 2014-06-06 ENCOUNTER — Other Ambulatory Visit (HOSPITAL_COMMUNITY): Payer: Self-pay | Admitting: Physical Medicine and Rehabilitation

## 2014-06-08 ENCOUNTER — Other Ambulatory Visit: Payer: Self-pay | Admitting: *Deleted

## 2014-06-08 MED ORDER — CARVEDILOL 3.125 MG PO TABS: 3.1250 mg | ORAL_TABLET | Freq: Two times a day (BID) | ORAL | Status: DC

## 2014-06-29 ENCOUNTER — Emergency Department: Payer: Self-pay | Admitting: Emergency Medicine

## 2014-06-29 LAB — CBC WITH DIFFERENTIAL/PLATELET
BASOS ABS: 0.1 10*3/uL (ref 0.0–0.1)
Basophil %: 1 %
EOS ABS: 0.2 10*3/uL (ref 0.0–0.7)
Eosinophil %: 2.3 %
HCT: 38.4 % (ref 35.0–47.0)
HGB: 12.2 g/dL (ref 12.0–16.0)
Lymphocyte #: 1.2 10*3/uL (ref 1.0–3.6)
Lymphocyte %: 17.4 %
MCH: 28.7 pg (ref 26.0–34.0)
MCHC: 31.8 g/dL — AB (ref 32.0–36.0)
MCV: 90 fL (ref 80–100)
MONO ABS: 0.6 x10 3/mm (ref 0.2–0.9)
Monocyte %: 8.5 %
NEUTROS PCT: 70.8 %
Neutrophil #: 4.8 10*3/uL (ref 1.4–6.5)
PLATELETS: 202 10*3/uL (ref 150–440)
RBC: 4.25 10*6/uL (ref 3.80–5.20)
RDW: 15.2 % — ABNORMAL HIGH (ref 11.5–14.5)
WBC: 6.7 10*3/uL (ref 3.6–11.0)

## 2014-06-29 LAB — URINALYSIS, COMPLETE
BACTERIA: NONE SEEN
BILIRUBIN, UR: NEGATIVE
Blood: NEGATIVE
Glucose,UR: NEGATIVE mg/dL (ref 0–75)
KETONE: NEGATIVE
Leukocyte Esterase: NEGATIVE
Nitrite: NEGATIVE
Ph: 5 (ref 4.5–8.0)
Protein: 30
RBC,UR: 1 /HPF (ref 0–5)
Specific Gravity: 1.019 (ref 1.003–1.030)
Squamous Epithelial: 3
WBC UR: 1 /HPF (ref 0–5)

## 2014-06-29 LAB — BASIC METABOLIC PANEL
Anion Gap: 3 — ABNORMAL LOW (ref 7–16)
BUN: 17 mg/dL (ref 7–18)
CALCIUM: 8.6 mg/dL (ref 8.5–10.1)
CO2: 32 mmol/L (ref 21–32)
Chloride: 105 mmol/L (ref 98–107)
Creatinine: 0.75 mg/dL (ref 0.60–1.30)
GLUCOSE: 142 mg/dL — AB (ref 65–99)
Osmolality: 283 (ref 275–301)
Potassium: 4 mmol/L (ref 3.5–5.1)
Sodium: 140 mmol/L (ref 136–145)

## 2014-06-29 LAB — HEPATIC FUNCTION PANEL A (ARMC)
ALBUMIN: 3.2 g/dL — AB (ref 3.4–5.0)
ALK PHOS: 120 U/L — AB
Bilirubin, Direct: <0.1 mg/dL (ref 0.00–0.20)
Bilirubin,Total: 0.4 mg/dL (ref 0.2–1.0)
SGOT(AST): 36 U/L (ref 15–37)
SGPT (ALT): 25 U/L
Total Protein: 7.1 g/dL (ref 6.4–8.2)

## 2014-06-29 LAB — LIPASE, BLOOD: LIPASE: 125 U/L (ref 73–393)

## 2014-06-29 LAB — MONONUCLEOSIS SCREEN: MONO TEST: POSITIVE

## 2014-06-29 LAB — TROPONIN I: Troponin-I: <0.02 ng/mL

## 2015-01-08 NOTE — Discharge Summary (Signed)
PATIENT NAME:  Cheyenne Frank, Cheyenne Frank MR#:  [id number] DATE OF BIRTH:  12-Aug-1941  DATE OF ADMISSION:  12/09/2013 DATE OF DISCHARGE:  12/11/2013  DISCHARGE DIAGNOSIS: Fall, likely multifactorial, now requiring acute inpatient rehab.   SECONDARY DIAGNOSES:  1.  Diabetes.  2.  Hypertension.  3.  Diverticulosis.  4.  Restless leg syndrome.  5.  History of migraines.  6.  Osteoarthritis.  7.  Fibromyalgia.  8.  Depression.  9.  History of palpitation.   CONSULTATIONS: None.   PROCEDURES AND RADIOLOGY: CT scan of the thoracolumbar spine on the 27th of March showed extensive post surgical changes of the thoracolumbar spine without acute features. No hardware failure. Old compression fracture of L2. Solid fusion from L1 to L4.   Chest x-ray on the 25th of March showed no acute cardiopulmonary disease.   CT scan of the head without contrast on the 25th of March showed no acute intracranial abnormalities.   MAJOR LABORATORY PANEL: Urinalysis on admission was negative.   Blood cultures x2 were negative.   HISTORY AND SHORT HOSPITAL COURSE: The patient is a 74 year old female with the above-mentioned medical problems who was admitted status post fall. Please see Dr. Olene Craven dictated history and physical for further details. Physical therapy consultation along with occupational therapy consultation was obtained.   DISPOSITION: After discussion with family and social worker, the decision was made to have the patient transferred to acute inpatient rehab at [facility]. The patient and family were in agreement. A bed was available and the patient is being transferred to acute inpatient rehab at [facility]:  VITAL SIGNS: Temperature 98, heart rate 82 per minute, respirations 16 per minute, blood pressure 107/70 mmHg. She was saturating 96% on room air.  CARDIOVASCULAR: S1, S2 normal. No murmurs, rubs, or gallops. LUNGS: Clear to auscultation bilaterally. No  wheezing, rales, rhonchi or crepitation.  ABDOMEN: Soft, benign.  NEUROLOGIC: Nonfocal examination.   All other physical examination remained at baseline.   DISCHARGE MEDICATIONS:  1.  Coreg 3.125 mg p.o. b.i.d.  2.  Lisinopril 20 mg p.o. b.i.d.  3.  Paroxetine 20 mg p.o. daily.  4.  Trazodone 50 mg p.o. at bedtime as needed.  5.  Lidoderm 5% topical to affected area once a day.  6.  Aspirin 325 mg p.o. daily.  7.  Gabapentin 400 mg p.o. 3 times a day.  8.  Glucagon Emergency Kit for low blood sugar.  9.  Tylenol 650 mg p.o. every 4 hours as needed.  10.  Senna Plus 2 tablets p.o. twice a day.  11.  Insulin Levemir 24 units subcu daily.  12.  Keppra 500 mg three tablets p.o. b.i.d.  13.  MiraLax once daily.  14.  Multivitamins once daily.  15.  Insulin NovoLog sliding scale.  16.  Nystatin topical to affected area twice a day.  17.  RisaQuad 1 capsule twice a day.  18.  Vitamin C 500 mg p.o. 4 times a day.  19.  Zinc sulfate 220 mg p.o. daily.  20.  Tylenol 325 mg p.o. 4 times a day.   DISCHARGE DIET: Low sodium.   DISCHARGE ACTIVITY: As tolerated.   DISCHARGE INSTRUCTIONS AND FOLLOW-UP: The patient was instructed to follow up with her primary care physician in 1 to 2 weeks. She will get inpatient rehab while at the facility.   TOTAL TIME DISCHARGING THIS PATIENT: 55 minutes .  ____________________________ Lucina Mellow. Manuella Ghazi, MD vss:np D: 12/11/2013 15:35:37  ET T: 12/11/2013 20:15:47 ET JOB#: [id number]  cc:  S. , MD, <Dictator> Primary care physician [facility]  S  MD ELECTRONICALLY SIGNED 12/13/2013 21:02 

## 2015-01-08 NOTE — H&P (Signed)
PATIENT NAME:  Cheyenne Frank, Cheyenne Frank MR#:  [id number] DATE OF BIRTH:  1941/05/20  DATE OF ADMISSION:  12/09/2013  REFERRING PHYSICIAN:  Dr. Darnelle Catalan.   REASON FOR ADMISSION:  Status post fall.   HISTORY OF PRESENT ILLNESS:  This is a very nice 74 year old female with history of recent anterior and posterior approach lumbar spinal fusion due to significant failure to progress with severe pain.  The patient had the surgery on 10/15/2013 for what after that went to rehabilitation.  After rehabilitation, the patient had an infection of her wound for what she was re-hospitalized and had an I and D done on the posterior and anterior wounds.  With lavage no removal of the hardware.  All this was complicated with a stroke that happened during one of her surgeries and on top of that the patient had a subdural hematoma for what she required a bur hole to be done.  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  The patient went back to skilled nursing facility and today she was in the bathroom and she got up without supervision.  She has been told to get up with her brace and fully supervised, but she did not.  She got weak, got dizzy, lightheaded and fell backwards.  She hit her head against the wall, but she did not have any significant loss of consciousness.  She was her normal self before the incident and she was back to being her normal self after the incident without any significant postictal effects or changes in her mental status.  The patient was seen in the ER where she was slightly hypotensive and she was admitted just as an observation due to her low blood pressure and significant fall and weakness.  Hospitalist services were contacted for this admission.   REVIEW OF SYSTEMS:   A 12 system review of systems is done.  CONSTITUTIONAL:  No fever, fatigue.  Positive weakness today, but she has been recovering from her surgery very well, is regaining strength.  EYES:  No blurry vision,  double vision.  EARS, NOSE, THROAT:  Positive left side hearing loss after her stroke.  No epistaxis or nasal discharge.  RESPIRATORY:  No significant cough, wheezing, or hemoptysis.   GASTROINTESTINAL:  Positive decreased appetite, not eating or drinking well.  No nausea, vomiting, abdominal pain, constipation, diarrhea. NEUROLOGIC:  The patient is alert, oriented x 3, in no acute distress.  No respiratory distress.  Hemodynamically stable.  No syncopal episodes in the past.  No edema.  No orthopnea.  Positive weakness of the right lower extremity, but no significant problems with her mobility.  Sensation is normal in four extremities.  No migraines or headaches. GENITOURINARY:  No dysuria, hematuria, changes in frequency.  ENDOCRINE:  No polyuria, polydipsia, polyphagia, cold or heat intolerance.  HEMATOLOGIC AND LYMPHATIC:  No anemia, easy bruising or bleeding.  SKIN:  No rashes, petechiae, new lesions.  MUSCULOSKELETAL:  No neck pain, hip pain.  Positive actually lower back pain after the fall, but after surgery the patient has not had any significant pain issues, was not even taking pain medications for it.   PSYCHIATRIC:  No insomnia or significant depression.   PAST MEDICAL HISTORY: 1.  Diabetes.  2.  Hypertension.  3.  Diverticulosis.  4.  Restless leg syndrome.  5.  History of migraines.  6.  Osteoarthritis.  7.  Fibromyalgia.  8.  Depression.  9.  Status post lumbar surgery as mentioned above.  10.  Palpitations in the past, possible diagnosis of atrial fibrillation, which was paroxysmal and resolved.   PAST SURGICAL HISTORY:  1.  Anterior and posterior approach of the lumbar spine on January of 2015.  Previous to that, she had lumbar laminectomy.  2.  Cholecystectomy.  3.  Open left inguinal hernia repair.  4.  Breast reduction.  5.  Total hysterectomy with right oophorectomy.  6.  Tonsillectomy.  7.  Adenoidectomy.  8.  Umbilical hernia repair.  9.  Bilateral cataracts.   10.  Burr hole due to subdural hematoma.  11.  I and D of the anterior and posterior approach of her spinal fusion done on February of 2015.   ALLERGIES:  DEMEROL, SULFA, CODEINE.   SOCIAL HISTORY:  The patient does not smoke, does not drink.  She does not use drugs.  She is a retired Engineer, civil (consulting) at [employer].  The patient has been in the skilled nursing facility for her recovery.   FAMILY HISTORY:  She was adopted, so noncontributory.   MEDICATIONS:  Zinc 220 mg once a day, vitamin C 500 mg daily, Tylenol 325 mg 4 daily, trazodone 50 mg at bedtime, senna as needed for constipation, paroxetine 20 mg once daily, nystatin as needed for skin fungal infection.  NovoLog insulin sliding scale.  MiraLAX as needed for constipation, lisinopril 20 mg twice daily.  Lidoderm 5 mg to the affected area.  Keppra 1500 mg twice daily, Levemir 100 mg 24 units once daily, cephalexin 1000 mg twice daily, Coreg 3.125 mg twice daily, gabapentin 400 mg 3 times daily, aspirin 81 mg daily.   PHYSICAL EXAMINATION: VITAL SIGNS:  Blood pressure of 101/53, pulse 85 to 91, respirations 18, temperature 98, oxygen saturation 96% on room air.  GENERAL:  The patient is alert, oriented x 3, in no acute distress.  No respiratory distress.  Hemodynamically stable.  HEENT:  Her pupils are equal and reactive.  Extraocular movements are intact.  Mucosa are moist.  Anicteric sclerae.  Pink conjunctivae.  No oral lesions.  No oropharyngeal exudates.  NECK:  Supple with no JVD.  No thyromegaly.  No adenopathy.  No carotid bruits.  CARDIOVASCULAR:  Regular rate and rhythm.  No murmurs, rubs, or gallops.  No displacement of PMI.  LUNGS:  Clear without any wheezing or crepitus.  No use of accessory muscles.  ABDOMEN:  Soft, nontender, nondistended.  No hepatosplenomegaly.  No masses.  Bowel sounds are positive.  No significant signs of infection on the anterior incision.  BACK:  Significant incision on the thoracic and lumbar spine with a little  opening at the level of midthoracic spine which is healing.  No signs of infection.  No signs of abscesses.  EXTREMITIES:  No edema, cyanosis or clubbing.  Pulses +2.  Capillary refill less than 3.  Strength is 5 out of 5 in all four extremities.  NEUROLOGIC:  Cranial nerves II through XII intact.  No focal findings.  Miserable at this moment.  The patient states that she is weak on the right side, but at this moment there is no significant asymmetry.  There is no facial asymmetry.  Her tongue is central.  Her uvula central and extraocular movements intact and there is in general no focal findings.  PSYCHIATRIC:  No agitation.  The patient is alert, oriented x 3.  LYMPHATIC:  Negative for lymphadenopathy in the neck or supraclavicular areas.  SKIN:  No rashes or petechiae.  MUSCULOSKELETAL:  No joint effusions.   LABORATORY RESULTS:  Glucose is 193, creatinine 1.2, sodium 138, potassium 4.4.  GFR is around 52.  White count is 2.5, hemoglobin is 9.6, platelet count is 334.  Urinalysis, no signs of UTI, white count 2 on urinalysis, no nitrates.  CT scan of the head is no not showing signs  of hemorrhage or acute abnormalities.  No change from prior.  Chest x-ray, no active pulmonary disease.   ASSESSMENT AND PLAN:  This is a very nice a 74 year old female with history of multiple problems as mentioned above, complications of spinal fusion, anterior, posterior approach with multiple hospitalizations including one for subdural hematoma and cerebrovascular accident, status post burr hole, comes today after a fall in her nursing home.  1.  Fall.  The patient got weak.  It is very likely the patient had orthostatic hypotension based on her history of previous spinal surgery with use of a spinal brace.  The patient did not get up with her spinal brace which could potentiate lowering of her blood pressure much faster than usually with significant hypotension and fall.  The patient is to be admitted for intravenous  fluids as her blood pressure is borderline low.  She is slightly tachycardic for what orthostatics have been ordered.  Intravenous fluids given and the patient is going to be admitted on observation under telemetry monitoring.  Overall I did not see any signs of infection.  The patient looks hemodynamically stable.  No signs of stroke.  Neurologic exam is intact.  Likely this is again secondary to orthostatic hypotension due to intravascular volume depletion and no use of brace.  The patient to be monitored whenever she gets up and use the brace all the time that she is standing.  As far as her other medical problems, her lumbar surgery is healing okay.  No signs of infection.  The patient was taking cephalexin and apparently she has to be on it for a very long time for what we are going to continue that medication.  2.  Hypertension.  Hold blood pressure medications for today.  3.  Restless leg syndrome, seems to be stable.  Continue Mirapex.  4.  Depression, seems to be stable.  5.  History of intracranial bleeding.  At this moment that has resolved.  6.  Anemia.  The patient has a hemoglobin of 9.6.  Following her previous labs that we have from January 31st, her hemoglobin was 11.5.  She had a couple points drop since then, but since then she has had several surgeries including an incision and drainage.  This might be a slow blood loss secondary to that, but since we have not checked her blood count in a while, we are going to do a guaiac and check iron levels.  Does not have any signs of acute bleeding at this moment.  7.  Gastrointestinal prophylaxis with Protonix.  8.  Deep vein thrombosis prophylaxis.  The patient has a history of gastrointestinal bleeding, but she has been tolerating low dose heparin so far.  Her bleeding is more than two months out for what it is okay to use deep vein thrombosis prophylaxis.   I spent about 50 minutes with this patient.     ____________________________ Felipa Furnaceoberto  Sanchez Gutierrez, MD rsg:ea D: 12/09/2013 20:25:11 ET T: 12/09/2013 23:28:06 ET JOB#: [id number]  cc: Felipa Furnaceoberto Sanchez Gutierrez, MD, <Dictator> Aloha Bartok Juanda ChanceSANCHEZ GUTIERRE MD ELECTRONICALLY SIGNED 12/13/2013 13:52

## 2015-02-19 ENCOUNTER — Ambulatory Visit
Admission: EM | Admit: 2015-02-19 | Discharge: 2015-02-19 | Disposition: A | Discharge status: Home / Self Care | Payer: Medicare Other | Attending: Family Medicine | Admitting: Family Medicine

## 2015-02-19 ENCOUNTER — Encounter: Payer: Self-pay | Admitting: Family Medicine

## 2015-02-19 DIAGNOSIS — I959 Hypotension, unspecified: Secondary | ICD-10-CM | POA: Diagnosis not present

## 2015-02-19 DIAGNOSIS — T07XXXA Unspecified multiple injuries, initial encounter: Secondary | ICD-10-CM

## 2015-02-19 DIAGNOSIS — R7981 Abnormal blood-gas level: Secondary | ICD-10-CM | POA: Diagnosis not present

## 2015-02-19 DIAGNOSIS — T148 Other injury of unspecified body region: Secondary | ICD-10-CM | POA: Diagnosis not present

## 2015-02-19 NOTE — Discharge Instructions (Signed)
Contusion °A contusion is a deep bruise. Contusions are the result of an injury that caused bleeding under the skin. The contusion may turn blue, purple, or yellow. Minor injuries will give you a painless contusion, but more severe contusions may stay painful and swollen for a few weeks.  °CAUSES  °A contusion is usually caused by a blow, trauma, or direct force to an area of the body. °SYMPTOMS  °· Swelling and redness of the injured area. °· Bruising of the injured area. °· Tenderness and soreness of the injured area. °· Pain. °DIAGNOSIS  °The diagnosis can be made by taking a history and physical exam. An X-ray, CT scan, or MRI may be needed to determine if there were any associated injuries, such as fractures. °TREATMENT  °Specific treatment will depend on what area of the body was injured. In general, the best treatment for a contusion is resting, icing, elevating, and applying cold compresses to the injured area. Over-the-counter medicines may also be recommended for pain control. Ask your caregiver what the best treatment is for your contusion. °HOME CARE INSTRUCTIONS  °· Put ice on the injured area. °¨ Put ice in a plastic bag. °¨ Place a towel between your skin and the bag. °¨ Leave the ice on for 15-20 minutes, 3-4 times a day, or as directed by your health care provider. °· Only take over-the-counter or prescription medicines for pain, discomfort, or fever as directed by your caregiver. Your caregiver may recommend avoiding anti-inflammatory medicines (aspirin, ibuprofen, and naproxen) for 48 hours because these medicines may increase bruising. °· Rest the injured area. °· If possible, elevate the injured area to reduce swelling. °SEEK IMMEDIATE MEDICAL CARE IF:  °· You have increased bruising or swelling. °· You have pain that is getting worse. °· Your swelling or pain is not relieved with medicines. °MAKE SURE YOU:  °· Understand these instructions. °· Will watch your condition. °· Will get help right  away if you are not doing well or get worse. °Document Released: 06/13/2005 Document Revised: 09/08/2013 Document Reviewed: 07/09/2011 °Exit[employer]® Patient Information ©2015 [employer]. This information is not intended to replace advice given to you by your health care provider. Make sure you discuss any questions you have with your health care provider. ° °

## 2015-02-19 NOTE — ED Notes (Signed)
[facility]. Patient going POV for further evaluation

## 2015-02-19 NOTE — ED Provider Notes (Signed)
CSN: [id number]     Arrival date & time 02/19/15  1342 History   First MD Initiated Contact with Patient 02/19/15 1452     Chief Complaint  Patient presents with  . Fall    fell last night going upstairs in garage, landed on right side and hurt right arm, shoulder and leg   (Consider location/radiation/quality/duration/timing/severity/associated sxs/prior Treatment) Patient is a 74 y.o. female presenting with fall. The history is provided by the patient and a relative. No language interpreter was used.  Fall This is a new problem. The current episode started 12 to 24 hours ago. Progression since onset: They states the swelling has improved since yesterday but she feels worse. Associated symptoms include shortness of breath. Pertinent negatives include no chest pain, no abdominal pain and no headaches. The symptoms are aggravated by exertion. Nothing relieves the symptoms. She has tried nothing for the symptoms.   she denies any LOC. When asked she's had a history of COPD or asthma she states is been years since she's had trouble. She was able to ambulate at home but she is in a wheelchair here now.  Past Medical History  Diagnosis Date  . Macular degeneration   . Complication of anesthesia     " feel like I'm dying after I wake up"  . Hypertension   . Dysrhythmia     unknown type   . Asthma     no problems since 1999 (after moving into a new home)  . Stroke 2010    verbal aphasia x 30 min - resolved - no problem since then  . Arthritis   . Itching   . GERD (gastroesophageal reflux disease)   . Nocturia   . Hx of transfusion 2011  . Diabetes mellitus without complication   . Sleep apnea     Dx 1990's unable to wear c-pap -  . Anxiety   . Diverticulosis   . Flat back syndrome   . Restless leg syndrome   . Anemia of chronic disease   . Fibromyalgia   . Chronic airway obstruction    Past Surgical History  Procedure Laterality Date  . Tonsillectomy    . Dilation and curettage  of uterus    . Abdominal hysterectomy      rt so  . Hernia repair    . Breast reduction surgery    . Cholecystectomy  1990  . Cataracts removed    . Back surgery  2010 / 2011  . 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 Burr hole for subdural hematoma  2015  . Posterior laminectomy / decompression lumbar spine      with T10-S1 fusion   Family History  Problem Relation Age of Onset  . COPD Sister     had lung transplant  . COPD Brother    History  Substance Use Topics  . Smoking status: Never Smoker   . Smokeless tobacco: Not on file  . Alcohol Use: No   OB History    No data available     Review of Systems  Respiratory: Positive for shortness of breath.   Cardiovascular: Negative for chest pain.  Gastrointestinal: Negative for abdominal pain.  Musculoskeletal: Positive for myalgias, joint swelling and gait problem.  Skin: Positive for color change.  Neurological: Negative for headaches.    Allergies  Codeine; Dilaudid; Demerol; Pravastatin;  Lipitor; Nifedipine; and Sulfa antibiotics  Home Medications   Prior to Admission medications   Medication Sig Start Date End Date Taking? Authorizing Provider  acetaminophen (TYLENOL) 500 MG tablet Take 1,000 mg by mouth every 6 (six) hours as needed for pain.   Yes Historical Provider, MD  aspirin 325 MG EC tablet Take 325 mg by mouth daily.   Yes Historical Provider, MD  carvedilol (COREG) 3.125 MG tablet Take 1 tablet (3.125 mg total) by mouth 2 (two) times daily with a meal. 06/08/14  Yes Erick Colace, MD  cephALEXin (KEFLEX) 500 MG capsule Take 1 capsule (500 mg total) by mouth every 8 (eight) hours. 12/24/13  Yes Evlyn Kanner Love, PA-C  gabapentin (NEURONTIN) 400 MG capsule Take 400 mg by mouth 3 (three) times daily.   Yes Historical Provider, MD  insulin glargine (LANTUS) 100 UNIT/ML  injection Inject 0.24 mLs (24 Units total) into the skin at bedtime. 12/24/13  Yes Evlyn Kanner Love, PA-C  insulin lispro (HUMALOG) 100 UNIT/ML injection Inject 0-10 Units into the skin 3 (three) times daily before meals. 0-150 = 0 units, 151-200=2 units, 201-250=4 units, 251-300=6 units, 301-350=8 units, 10 units if FSBS 351Comment exceeds the 0 character limit   Yes Historical Provider, MD  levETIRAcetam (KEPPRA) 750 MG tablet Take 2 tablets (1,500 mg total) by mouth 2 (two) times daily. 02/15/14  Yes Erick Colace, MD  lisinopril (PRINIVIL,ZESTRIL) 2.5 MG tablet  01/23/14  Yes Historical Provider, MD  Multiple Vitamin (MULTIVITAMIN WITH MINERALS) TABS tablet Take 1 tablet by mouth daily. 12/24/13  Yes Evlyn Kanner Love, PA-C  nystatin (MYCOSTATIN/NYSTOP) 100000 UNIT/GM POWD  01/28/14  Yes Historical Provider, MD  pantoprazole (PROTONIX) 40 MG tablet Take 40 mg by mouth daily.   Yes Historical Provider, MD  PARoxetine (PAXIL) 20 MG tablet Take 1 tablet (20 mg total) by mouth every morning. 12/24/13  Yes Evlyn Kanner Love, PA-C  polyethylene glycol (MIRALAX / GLYCOLAX) packet Take 17 g by mouth daily as needed. 04/09/13  Yes Avel Peace, PA-C  traZODone (DESYREL) 50 MG tablet Take 1 tablet (50 mg total) by mouth at bedtime. 12/24/13  Yes Evlyn Kanner Love, PA-C  vitamin C (VITAMIN C) 500 MG tablet Take 1 tablet (500 mg total) by mouth daily. 12/24/13  Yes Evlyn Kanner Love, PA-C  zinc sulfate 220 MG capsule Take 1 capsule (220 mg total) by mouth daily. 12/24/13  Yes Evlyn Kanner Love, PA-C  iron polysaccharides (NIFEREX) 150 MG capsule Take 1 capsule (150 mg total) by mouth 2 times daily at 12 noon and 4 pm. 12/24/13   Evlyn Kanner Love, PA-C  tolterodine (DETROL) 2 MG tablet Take 1 tablet (2 mg total) by mouth at bedtime. 02/01/14   Erick Colace, MD   BP 98/56 mmHg  Pulse 92  Temp(Src) 98.7 F (37.1 C) (Tympanic)  Resp 18  Ht 5' 2.5" (1.588 m)  Wt 246 lb (111.585 kg)  BMI 44.25 kg/m2  SpO2 95% Physical Exam  HENT:  Head:  Normocephalic and atraumatic.  Neck: Neck supple.  Cardiovascular: Normal rate.   Pulmonary/Chest: No tachypnea. No respiratory distress. She has rhonchi in the right upper field, the right middle field, the right lower field, the left upper field, the left middle field and the left lower field. She exhibits tenderness.    Abdominal: Soft. Bowel sounds are normal.  Musculoskeletal:       Right shoulder: She exhibits swelling.       Right hip: She exhibits bony  tenderness and swelling.       Right knee: She exhibits swelling and erythema.       Legs: Vitals reviewed.   ED Course  Procedures (including critical care time) Labs Review Labs Reviewed - No data to display  Imaging Review No results found. Blood pressure continues to be low. I'm concerned about trying to get x-rays here especially if patient gets worse while being x-rayed. Pulse ox pulses 95 she has rib pain. Even though this happened last night going to recommend going to the emergency room or we can make sure she she remained stable to be x-rayed and evaluated. Discussed about EMS transport but she wants her daughter to take her and they decided to go to [facility] ED.  MDM   1. Multiple contusions   2. Hypotension, unspecified hypotension type   3. Abnormal pulse oximetry       Hassan Rowan, MD 02/19/15 [phone]

## 2015-03-02 ENCOUNTER — Other Ambulatory Visit: Payer: Self-pay | Admitting: Family Medicine

## 2015-03-02 DIAGNOSIS — Z1231 Encounter for screening mammogram for malignant neoplasm of breast: Secondary | ICD-10-CM

## 2015-05-05 ENCOUNTER — Ambulatory Visit (INDEPENDENT_AMBULATORY_CARE_PROVIDER_SITE_OTHER): Payer: Medicare Other | Admitting: Podiatry

## 2015-05-05 ENCOUNTER — Encounter: Payer: Self-pay | Admitting: Podiatry

## 2015-05-05 VITALS — BP 119/70 | HR 97 | Resp 18

## 2015-05-05 DIAGNOSIS — B351 Tinea unguium: Secondary | ICD-10-CM

## 2015-05-05 DIAGNOSIS — M79676 Pain in unspecified toe(s): Secondary | ICD-10-CM | POA: Diagnosis not present

## 2015-05-05 NOTE — Progress Notes (Signed)
Subjective:     Patient ID: Cheyenne Frank, female   DOB: 1941-01-23, 74 y.o.   MRN: [id number]  HPI 74 year old female presents the office today with concerns of toenail fungus to both of her first and second toenails. She states the nails are thick, discolored, elongated she is trim them herself. She states that her daughter Cheyenne Frank the other toenails. She states that at first and second toenails are painful particularly with shoe gear. Denies any redness or drainage. No other complaints at this time. Her last blood sugar was 238. She does have tingling and numbness and she is on gabapentin. No claudication symptoms.  Review of Systems  All other systems reviewed and are negative.      Objective:   Physical Exam AAO 3, NAD DP/PT pulses palpable, CRT less than 3 seconds Protective sensation decreased with Simms Weinstein monofilament, vibratory sensation intact, Achilles tendon reflex intact. Bilateral hallux and second digit toenails are hypertrophic, dystrophic, discolored. There is tenderness palpation around the nails 1 and 2 bilaterally. The remaining nails are without pathology at this time. No open lesions or pre-ulcerative lesions. No areas of tenderness to bilateral lower extremities. No overlying edema, erythema, increase in warmth bilaterally. MMT 5/5, ROM WNL Pain with calf compression, swelling, warmth, erythema.    Assessment:     74 year old female with symptomatic onychomycosis 4    Plan:     -Treatment options discussed including all alternatives, risks, and complications -Nail sharply debridement 4 without, complications/bleeding. I discussed treatment options for onychomycosis. She would like to proceed with topical treatment were discussed fungi-nail -Discussed daily foot inspection. -Follow-up 3 weeks or sooner if any problems arise. In the meantime, encouraged to call the office with any questions, concerns, change in symptoms.   Ovid Curd, DPM

## 2015-05-05 NOTE — Patient Instructions (Signed)
Diabetes and Foot Care Diabetes may cause you to have problems because of poor blood supply (circulation) to your feet and legs. This may cause the skin on your feet to become thinner, break easier, and heal more slowly. Your skin may become dry, and the skin may peel and crack. You may also have nerve damage in your legs and feet causing decreased feeling in them. You may not notice minor injuries to your feet that could lead to infections or more serious problems. Taking care of your feet is one of the most important things you can do for yourself.  HOME CARE INSTRUCTIONS  Wear shoes at all times, even in the house. Do not go barefoot. Bare feet are easily injured.  Check your feet daily for blisters, cuts, and redness. If you cannot see the bottom of your feet, use a mirror or ask someone for help.  Wash your feet with warm water (do not use hot water) and mild soap. Then pat your feet and the areas between your toes until they are completely dry. Do not soak your feet as this can dry your skin.  Apply a moisturizing lotion or petroleum jelly (that does not contain alcohol and is unscented) to the skin on your feet and to dry, brittle toenails. Do not apply lotion between your toes.  Trim your toenails straight across. Do not dig under them or around the cuticle. File the edges of your nails with an emery board or nail file.  Do not cut corns or calluses or try to remove them with medicine.  Wear clean socks or stockings every day. Make sure they are not too tight. Do not wear knee-high stockings since they may decrease blood flow to your legs.  Wear shoes that fit properly and have enough cushioning. To break in new shoes, wear them for just a few hours a day. This prevents you from injuring your feet. Always look in your shoes before you put them on to be sure there are no objects inside.  Do not cross your legs. This may decrease the blood flow to your feet.  If you find a minor scrape,  cut, or break in the skin on your feet, keep it and the skin around it clean and dry. These areas may be cleansed with mild soap and water. Do not cleanse the area with peroxide, alcohol, or iodine.  When you remove an adhesive bandage, be sure not to damage the skin around it.  If you have a wound, look at it several times a day to make sure it is healing.  Do not use heating pads or hot water bottles. They may burn your skin. If you have lost feeling in your feet or legs, you may not know it is happening until it is too late.  Make sure your health care provider performs a complete foot exam at least annually or more often if you have foot problems. Report any cuts, sores, or bruises to your health care provider immediately. SEEK MEDICAL CARE IF:   You have an injury that is not healing.  You have cuts or breaks in the skin.  You have an ingrown nail.  You notice redness on your legs or feet.  You feel burning or tingling in your legs or feet.  You have pain or cramps in your legs and feet.  Your legs or feet are numb.  Your feet always feel cold. SEEK IMMEDIATE MEDICAL CARE IF:   There is increasing redness,   swelling, or pain in or around a wound.  There is a red line that goes up your leg.  Pus is coming from a wound.  You develop a fever or as directed by your health care provider.  You notice a bad smell coming from an ulcer or wound. Document Released: 08/31/2000 Document Revised: 05/06/2013 Document Reviewed: 02/10/2013 ExitCare Patient Information 2015 [employer]. This information is not intended to replace advice given to you by your health care provider. Make sure you discuss any questions you have with your health care provider.  

## 2015-05-12 ENCOUNTER — Emergency Department: Payer: Medicare Other

## 2015-05-12 ENCOUNTER — Emergency Department
Admission: EM | Admit: 2015-05-12 | Discharge: 2015-05-12 | Disposition: A | Discharge status: Home / Self Care | Payer: Medicare Other | Attending: Emergency Medicine | Admitting: Emergency Medicine

## 2015-05-12 ENCOUNTER — Encounter: Payer: Self-pay | Admitting: Emergency Medicine

## 2015-05-12 ENCOUNTER — Other Ambulatory Visit: Payer: Self-pay

## 2015-05-12 DIAGNOSIS — R4781 Slurred speech: Secondary | ICD-10-CM | POA: Diagnosis not present

## 2015-05-12 DIAGNOSIS — I10 Essential (primary) hypertension: Secondary | ICD-10-CM | POA: Diagnosis not present

## 2015-05-12 DIAGNOSIS — R609 Edema, unspecified: Secondary | ICD-10-CM | POA: Insufficient documentation

## 2015-05-12 DIAGNOSIS — R4789 Other speech disturbances: Secondary | ICD-10-CM

## 2015-05-12 DIAGNOSIS — Z79899 Other long term (current) drug therapy: Secondary | ICD-10-CM | POA: Diagnosis not present

## 2015-05-12 DIAGNOSIS — Z7902 Long term (current) use of antithrombotics/antiplatelets: Secondary | ICD-10-CM | POA: Insufficient documentation

## 2015-05-12 DIAGNOSIS — Z794 Long term (current) use of insulin: Secondary | ICD-10-CM | POA: Insufficient documentation

## 2015-05-12 DIAGNOSIS — Z7982 Long term (current) use of aspirin: Secondary | ICD-10-CM | POA: Diagnosis not present

## 2015-05-12 DIAGNOSIS — R140 Abdominal distension (gaseous): Secondary | ICD-10-CM | POA: Diagnosis not present

## 2015-05-12 DIAGNOSIS — M7989 Other specified soft tissue disorders: Secondary | ICD-10-CM | POA: Diagnosis present

## 2015-05-12 DIAGNOSIS — J45901 Unspecified asthma with (acute) exacerbation: Secondary | ICD-10-CM | POA: Diagnosis not present

## 2015-05-12 DIAGNOSIS — E119 Type 2 diabetes mellitus without complications: Secondary | ICD-10-CM | POA: Insufficient documentation

## 2015-05-12 LAB — CBC
HEMATOCRIT: 38.9 % (ref 35.0–47.0)
Hemoglobin: 12.9 g/dL (ref 12.0–16.0)
MCH: 29.4 pg (ref 26.0–34.0)
MCHC: 33.2 g/dL (ref 32.0–36.0)
MCV: 88.4 fL (ref 80.0–100.0)
Platelets: 189 10*3/uL (ref 150–440)
RBC: 4.4 MIL/uL (ref 3.80–5.20)
RDW: 15.6 % — AB (ref 11.5–14.5)
WBC: 7.8 10*3/uL (ref 3.6–11.0)

## 2015-05-12 LAB — URINALYSIS COMPLETE WITH MICROSCOPIC (ARMC ONLY)
BACTERIA UA: NONE SEEN
Bilirubin Urine: NEGATIVE
Glucose, UA: NEGATIVE mg/dL
Hgb urine dipstick: NEGATIVE
KETONES UR: NEGATIVE mg/dL
LEUKOCYTES UA: NEGATIVE
Nitrite: NEGATIVE
PH: 6 (ref 5.0–8.0)
PROTEIN: NEGATIVE mg/dL
Specific Gravity, Urine: 1.006 (ref 1.005–1.030)
WBC, UA: NONE SEEN WBC/hpf (ref 0–5)

## 2015-05-12 LAB — COMPREHENSIVE METABOLIC PANEL
ALBUMIN: 3.9 g/dL (ref 3.5–5.0)
ALT: 22 U/L (ref 14–54)
AST: 29 U/L (ref 15–41)
Alkaline Phosphatase: 93 U/L (ref 38–126)
Anion gap: 8 (ref 5–15)
BUN: 26 mg/dL — AB (ref 6–20)
CHLORIDE: 98 mmol/L — AB (ref 101–111)
CO2: 36 mmol/L — AB (ref 22–32)
Calcium: 9.4 mg/dL (ref 8.9–10.3)
Creatinine, Ser: 1.19 mg/dL — ABNORMAL HIGH (ref 0.44–1.00)
GFR calc Af Amer: 51 mL/min — ABNORMAL LOW (ref >60)
GFR calc non Af Amer: 44 mL/min — ABNORMAL LOW (ref >60)
GLUCOSE: 161 mg/dL — AB (ref 65–99)
POTASSIUM: 4.4 mmol/L (ref 3.5–5.1)
SODIUM: 142 mmol/L (ref 135–145)
Total Bilirubin: 0.5 mg/dL (ref 0.3–1.2)
Total Protein: 7 g/dL (ref 6.5–8.1)

## 2015-05-12 LAB — TSH: TSH: 2.683 u[IU]/mL (ref 0.350–4.500)

## 2015-05-12 LAB — BRAIN NATRIURETIC PEPTIDE: B Natriuretic Peptide: 43 pg/mL (ref 0.0–100.0)

## 2015-05-12 LAB — TROPONIN I

## 2015-05-12 LAB — LIPASE, BLOOD: LIPASE: 56 U/L — AB (ref 22–51)

## 2015-05-12 NOTE — Discharge Instructions (Signed)
Peripheral Edema °You have swelling in your legs (peripheral edema). This swelling is due to excess accumulation of salt and water in your body. Edema may be a sign of heart, kidney or liver disease, or a side effect of a medication. It may also be due to problems in the leg veins. Elevating your legs and using special support stockings may be very helpful, if the cause of the swelling is due to poor venous circulation. Avoid long periods of standing, whatever the cause. °Treatment of edema depends on identifying the cause. Chips, pretzels, pickles and other salty foods should be avoided. Restricting salt in your diet is almost always needed. Water pills (diuretics) are often used to remove the excess salt and water from your body via urine. These medicines prevent the kidney from reabsorbing sodium. This increases urine flow. °Diuretic treatment may also result in lowering of potassium levels in your body. Potassium supplements may be needed if you have to use diuretics daily. Daily weights can help you keep track of your progress in clearing your edema. You should call your caregiver for follow up care as recommended. °SEEK IMMEDIATE MEDICAL CARE IF:  °· You have increased swelling, pain, redness, or heat in your legs. °· You develop shortness of breath, especially when lying down. °· You develop chest or abdominal pain, weakness, or fainting. °· You have a fever. °Document Released: 10/11/2004 Document Revised: 11/26/2011 Document Reviewed: 09/21/2009 °ExitCare® Patient Information ©2015 [employer]. This information is not intended to replace advice given to you by your health care provider. Make sure you discuss any questions you have with your health care provider. ° °

## 2015-05-12 NOTE — ED Provider Notes (Signed)
[facility] Emergency Department Provider Note  ____________________________________________  Time seen: Approximately 310 PM  I have reviewed the triage vital signs and the nursing notes.   HISTORY  Chief Complaint Shortness of Breath; Leg Swelling; and Bloated    HPI Cheyenne Frank is a 74 y.o. female with a history of stroke and diabetes who is presenting today with right lower extremity swelling which is intermittent over the past 2 weeks. It resolves when the patient lies down. She also complains of ongoing bilateral hand swelling which is worse when she wakes up in the mornings. She is also complaining of shortness of breath with exertion. As well as abdominal bloating. Denies any chest pain. However, has had right groin pain for the past several months. Has had falls and x-rays which were negative for fracture. Family is also complaining of patient having difficulty finding words over the past several days. They said that her word finding difficulties intermittent and that at this time she is at her baseline.  There has not been any focal weakness or slurring of speech.   Past Medical History  Diagnosis Date  . Macular degeneration   . Complication of anesthesia     " feel like I'm dying after I wake up"  . Hypertension   . Dysrhythmia     unknown type   . Asthma     no problems since 1999 (after moving into a new home)  . Stroke 2010    verbal aphasia x 30 min - resolved - no problem since then  . Arthritis   . Itching   . GERD (gastroesophageal reflux disease)   . Nocturia   . Hx of transfusion 2011  . Diabetes mellitus without complication   . Sleep apnea     Dx 1990's unable to wear c-pap -  . Anxiety   . Diverticulosis   . Flat back syndrome   . Restless leg syndrome   . Anemia of chronic disease   . Fibromyalgia   . Chronic airway obstruction     Patient Active Problem List   Diagnosis Date Noted  . SDH (subdural hematoma)  12/11/2013  . Postoperative wound infection--lumbar spine/2015 12/11/2013  . CVA (cerebral infarction) 12/11/2013  . Postoperative anemia due to acute blood loss 04/09/2013  . Diabetes 04/07/2013  . Hypertension 04/07/2013  . Unspecified sleep apnea 04/07/2013  . OA (osteoarthritis) of knee 04/06/2013    Past Surgical History  Procedure Laterality Date  . Tonsillectomy    . Dilation and curettage of uterus    . Abdominal hysterectomy      rt so  . Hernia repair    . Breast reduction surgery    . Cholecystectomy  1990  . Cataracts removed    . Back surgery  2010 / 2011  . 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 Burr hole for subdural hematoma  2015  . Posterior laminectomy / decompression lumbar spine      with T10-S1 fusion    Current Outpatient Rx  Name  Route  Sig  Dispense  Refill  . acetaminophen (TYLENOL) 500 MG tablet   Oral   Take 1,000 mg by mouth every 6 (six) hours as needed for pain.         Marland Kitchen aspirin EC 81 MG tablet   Oral   Take 81  mg by mouth daily.         . beta carotene w/minerals (OCUVITE) tablet   Oral   Take 1 tablet by mouth 2 (two) times daily.         . carvedilol (COREG) 3.125 MG tablet   Oral   Take 3.125 mg by mouth daily.         . cephALEXin (KEFLEX) 500 MG capsule   Oral   Take 1 capsule (500 mg total) by mouth every 8 (eight) hours. Patient taking differently: Take 1,000 mg by mouth 2 (two) times daily.    90 capsule   1   . clopidogrel (PLAVIX) 75 MG tablet   Oral   Take 75 mg by mouth daily.          . furosemide (LASIX) 40 MG tablet   Oral   Take 40 mg by mouth daily.          Marland Kitchen gabapentin (NEURONTIN) 400 MG capsule   Oral   Take 400 mg by mouth 2 (two) times daily.          . hydrochlorothiazide (MICROZIDE) 12.5 MG capsule   Oral   Take 12.5 mg by mouth daily.          . insulin glargine (LANTUS) 100 UNIT/ML injection   Subcutaneous   Inject 58 Units into the skin at bedtime.         . insulin lispro (HUMALOG) 100 UNIT/ML injection   Subcutaneous   Inject 18 Units into the skin 3 (three) times daily with meals.         Marland Kitchen lisinopril (PRINIVIL,ZESTRIL) 5 MG tablet   Oral   Take 5 mg by mouth at bedtime.          . Multiple Vitamin (MULTIVITAMIN WITH MINERALS) TABS tablet   Oral   Take 1 tablet by mouth daily.         Marland Kitchen nystatin cream (MYCOSTATIN)   Topical   Apply topically as needed.         Marland Kitchen omeprazole (PRILOSEC) 20 MG capsule   Oral   Take 20 mg by mouth daily.         Marland Kitchen PARoxetine (PAXIL) 20 MG tablet   Oral   Take 40 mg by mouth at bedtime.         . polyethylene glycol (MIRALAX / GLYCOLAX) packet   Oral   Take 17 g by mouth daily as needed.   14 each   0   . rosuvastatin (CRESTOR) 5 MG tablet   Oral   Take 2.5 mg by mouth at bedtime.         . sitaGLIPtin (JANUVIA) 50 MG tablet   Oral   Take 50 mg by mouth daily.         . traZODone (DESYREL) 150 MG tablet   Oral   Take 150 mg by mouth at bedtime.            Allergies Codeine; Dilaudid; Demerol; Pravastatin; Lipitor; Nifedipine; Statins; Sulfa antibiotics; and Vancomycin  Family History  Problem Relation Age of Onset  . COPD Sister     had lung transplant  . COPD Brother     Social History Social History  Substance Use Topics  . Smoking status: Never Smoker   . Smokeless tobacco: Never Used  . Alcohol Use: No    Review of Systems Constitutional: No fever/chills Eyes: No visual changes. ENT: No sore throat. Cardiovascular: Denies chest pain. Respiratory:  As above Gastrointestinal: No abdominal pain.   No diarrhea.  No constipation. Genitourinary: Negative for dysuria. Musculoskeletal: Negative for back pain. Skin: Negative for rash. Neurological: Negative for headaches, focal weakness or numbness.  10-point ROS otherwise  negative.  ____________________________________________   PHYSICAL EXAM:  VITAL SIGNS: ED Triage Vitals  Enc Vitals Group     BP 05/12/15 1346 123/63 mmHg     Pulse Rate 05/12/15 1346 86     Resp 05/12/15 1346 20     Temp 05/12/15 1346 97.8 F (36.6 C)     Temp Source 05/12/15 1346 Oral     SpO2 05/12/15 1346 96 %     Weight 05/12/15 1346 230 lb (104.327 kg)     Height 05/12/15 1346 5\' 2"  (1.575 m)     Head Cir --      Peak Flow --      Pain Score 05/12/15 1351 5     Pain Loc --      Pain Edu? --      Excl. in GC? --     Constitutional: Alert and oriented. Well appearing and in no acute distress. Eyes: Conjunctivae are normal. PERRL. EOMI. Head: Atraumatic. Nose: No congestion/rhinnorhea. Mouth/Throat: Mucous membranes are moist.  Oropharynx non-erythematous. Neck: No stridor.   Cardiovascular: Normal rate, regular rhythm. Grossly normal heart sounds.  Good peripheral circulation. Respiratory: Normal respiratory effort.  No retractions. Lungs CTAB. Gastrointestinal: Soft and nontender. Mild epigastric distention. No abdominal bruits. No CVA tenderness. Musculoskeletal: No lower extremity tenderness nor edema.  No joint effusions. Neurologic:  Normal speech and language. No gross focal neurologic deficits are appreciated. No gait instability. Skin:  Skin is warm, dry and intact. No rash noted. Psychiatric: Mood and affect are normal. Speech and behavior are normal.  ____________________________________________   LABS (all labs ordered are listed, but only abnormal results are displayed)  Labs Reviewed  LIPASE, BLOOD - Abnormal; Notable for the following:    Lipase 56 (*)    All other components within normal limits  COMPREHENSIVE METABOLIC PANEL - Abnormal; Notable for the following:    Chloride 98 (*)    CO2 36 (*)    Glucose, Bld 161 (*)    BUN 26 (*)    Creatinine, Ser 1.19 (*)    GFR calc non Af Amer 44 (*)    GFR calc Af Amer 51 (*)    All other  components within normal limits  CBC - Abnormal; Notable for the following:    RDW 15.6 (*)    All other components within normal limits  URINALYSIS COMPLETEWITH MICROSCOPIC ([facility] ONLY) - Abnormal; Notable for the following:    Color, Urine COLORLESS (*)    APPearance CLEAR (*)    Squamous Epithelial / LPF 0-5 (*)    All other components within normal limits  BRAIN NATRIURETIC PEPTIDE  TSH  TROPONIN I   ____________________________________________  EKG  ED ECG REPORT I, Arelia Longest, the attending physician, personally viewed and interpreted this ECG.   Date: 05/12/2015  EKG Time: 1358  Rate: 92  Rhythm: Sinus rhythm with PACs  Axis: Normal axis  Intervals:none  ST&T Change: Inversion in aVL. Otherwise no ST elevations or depressions or abnormal T-wave inversions. Same T-wave inversions seen on EKG from 11/11/2013  ____________________________________________  RADIOLOGY  CAT scan of the brain without any acute intracranial process. Negative abdominal radiographs. No acute cardiopulmonary process. Personally reviewed these images. No evidence of lower extremity DVT. ____________________________________________   PROCEDURES  ____________________________________________   INITIAL IMPRESSION / ASSESSMENT AND PLAN / ED COURSE  Pertinent labs & imaging results that were available during my care of the patient were reviewed by me and considered in my medical decision making (see chart for details).  ----------------------------------------- 4:11 PM on 05/12/2015 -----------------------------------------  Discussed case with Dr. Greggory Stallion of [place] [place] office where the patient is seen for primary care. I discussed possible outpatient CAT scan with Dr. Greggory Stallion who says they will view the note and see the patient and evaluate for further studies. I discussed with the family to do as an outpatient because one thing I'm considering on the differential is a mass in the  abdomen including venous congestion causing the lower summary swelling and bloating. However, I do not feel we need to do this study emergently. We'll focus on the emergent pathology and rule out DVT as well as stroke.  ----------------------------------------- 6:45 PM on 05/12/2015 -----------------------------------------  She resting comfortably with heart rate in the 80s. Patient seems appropriate without any word finding difficulties at this time. Reviewed triage notes would say word forming difficulty but when the family is asked about this they say that she is having more difficulty finding her words intermittently. Patient with reassuring workup. Mild dehydration on her metabolic panel. Already on aspirin and Plavix. Unclear cause of patient's symptoms. However, would not increase anticoagulation because patient already on high level of antiplatelet therapy. After several days, still with no acute findings on her head CT is less likely that her symptoms are related to acute CVA. We'll discharge to home. Family states that they will call tomorrow morning for follow-up appointment with the primary care doctor. ____________________________________________   FINAL CLINICAL IMPRESSION(S) / ED DIAGNOSES  Acute peripheral edema. Intermittent word finding difficulty. Initial visit.    Myrna Blazer, MD 05/12/15 [phone]

## 2015-05-12 NOTE — ED Notes (Signed)
Pt reports that hands and legs (right leg in particular) are swollen in the mo

## 2015-05-12 NOTE — ED Notes (Signed)
Pt reports she has home health come to her house, nurse has noticed right sided abdominal distention, shortness of breath on exertion, right leg swelling, HTN and daughter reports pt has had some difficulty forming words.

## 2015-08-04 ENCOUNTER — Ambulatory Visit: Payer: Medicare Other

## 2015-12-13 ENCOUNTER — Encounter: Payer: Self-pay | Admitting: *Deleted

## 2015-12-13 ENCOUNTER — Ambulatory Visit
Admission: EM | Admit: 2015-12-13 | Discharge: 2015-12-13 | Disposition: A | Discharge status: Home / Self Care | Payer: Medicare Other | Attending: Family Medicine | Admitting: Family Medicine

## 2015-12-13 DIAGNOSIS — M26609 Unspecified temporomandibular joint disorder, unspecified side: Secondary | ICD-10-CM

## 2015-12-13 MED ORDER — METHYLPREDNISOLONE 4 MG PO TBPK: ORAL_TABLET | ORAL | Status: DC

## 2015-12-13 MED ORDER — HYDROCODONE-ACETAMINOPHEN 5-325 MG PO TABS: 1.0000 | ORAL_TABLET | Freq: Four times a day (QID) | ORAL | Status: DC | PRN

## 2015-12-13 NOTE — ED Notes (Signed)
Pt states that she has left jaw, ear and teeth pain, started yesterday.

## 2015-12-13 NOTE — ED Provider Notes (Signed)
CSN: [id number]     Arrival date & time 12/13/15  1701 History   First MD Initiated Contact with Patient 12/13/15 2002     Chief Complaint  Patient presents with  . Jaw Pain   (Consider location/radiation/quality/duration/timing/severity/associated sxs/prior Treatment) HPI   This is very pleasant 75 year old female who presents with left-sided jaw pain. States that it started 2 days ago. He states that any time that she chews it hurts terribly. Is localized to the TMJ area and she also feels it in her ear and her upper teeth at times. Had no fever or chills. She states that she does remember eating jerky last week did not feel the pain immediately when she opens her mouth was on hard it is almost unbearable. She has numerous comorbidities as outlined below. She is on numerous medications.  Past Medical History  Diagnosis Date  . Macular degeneration   . Complication of anesthesia     " feel like I'm dying after I wake up"  . Hypertension   . Dysrhythmia     unknown type   . Asthma     no problems since 1999 (after moving into a new home)  . Stroke (HCC) 2010    verbal aphasia x 30 min - resolved - no problem since then  . Arthritis   . Itching   . GERD (gastroesophageal reflux disease)   . Nocturia   . Hx of transfusion 2011  . Diabetes mellitus without complication (HCC)   . Sleep apnea     Dx 1990's unable to wear c-pap -  . Anxiety   . Diverticulosis   . Flat back syndrome   . Restless leg syndrome   . Anemia of chronic disease   . Fibromyalgia   . Chronic airway obstruction [facility])    Past Surgical History  Procedure Laterality Date  . Tonsillectomy    . Dilation and curettage of uterus    . Abdominal hysterectomy      rt so  . Hernia repair    . Breast reduction surgery    . Cholecystectomy  1990  . Cataracts removed    . Back surgery  2010 / 2011  . 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 Burr hole for subdural hematoma  2015  . Posterior laminectomy / decompression lumbar spine      with T10-S1 fusion   Family History  Problem Relation Age of Onset  . COPD Sister     had lung transplant  . COPD Brother    Social History  Substance Use Topics  . Smoking status: Never Smoker   . Smokeless tobacco: Never Used  . Alcohol Use: No   OB History    No data available     Review of Systems  Constitutional: Negative for fever, chills and fatigue.  Musculoskeletal: Positive for arthralgias.  All other systems reviewed and are negative.   Allergies  Codeine; Dilaudid; Demerol; Pravastatin; Lipitor; Nifedipine; Statins; Sulfa antibiotics; and Vancomycin  Home Medications   Prior to Admission medications   Medication Sig Start Date End Date Taking? Authorizing Provider  acetaminophen (TYLENOL) 500 MG tablet Take 1,000 mg by mouth every 6 (six) hours as needed for pain.   Yes Historical Provider, MD  aspirin EC 81 MG tablet Take 81 mg by mouth daily.   Yes Historical Provider, MD  beta carotene w/minerals (OCUVITE) tablet Take 1 tablet by mouth 2 (two) times daily.   Yes Historical Provider, MD  carvedilol (COREG) 3.125 MG tablet Take 3.125 mg by mouth daily.   Yes Historical Provider, MD  cephALEXin (KEFLEX) 500 MG capsule Take 1 capsule (500 mg total) by mouth every 8 (eight) hours. Patient taking differently: Take 1,000 mg by mouth 2 (two) times daily.  12/24/13  Yes Evlyn KannerPamela S Love, PA-C  clopidogrel (PLAVIX) 75 MG tablet Take 75 mg by mouth daily.  04/18/15  Yes Historical Provider, MD  furosemide (LASIX) 40 MG tablet Take 40 mg by mouth daily.  04/18/15  Yes Historical Provider, MD  gabapentin (NEURONTIN) 400 MG capsule Take 400 mg by mouth 2 (two) times daily.    Yes Historical Provider, MD  hydrochlorothiazide (MICROZIDE) 12.5 MG capsule Take 12.5 mg by mouth daily.   Yes Historical Provider, MD  insulin  glargine (LANTUS) 100 UNIT/ML injection Inject 58 Units into the skin at bedtime.   Yes Historical Provider, MD  insulin lispro (HUMALOG) 100 UNIT/ML injection Inject 18 Units into the skin 3 (three) times daily with meals.   Yes Historical Provider, MD  lisinopril (PRINIVIL,ZESTRIL) 5 MG tablet Take 5 mg by mouth at bedtime.  04/18/15  Yes Historical Provider, MD  Multiple Vitamin (MULTIVITAMIN WITH MINERALS) TABS tablet Take 1 tablet by mouth daily. 12/24/13  Yes Evlyn KannerPamela S Love, PA-C  omeprazole (PRILOSEC) 20 MG capsule Take 20 mg by mouth daily.   Yes Historical Provider, MD  PARoxetine (PAXIL) 20 MG tablet Take 40 mg by mouth at bedtime.   Yes Historical Provider, MD  polyethylene glycol (MIRALAX / GLYCOLAX) packet Take 17 g by mouth daily as needed. 04/09/13  Yes Avel Peacerew Perkins, PA-C  rosuvastatin (CRESTOR) 5 MG tablet Take 2.5 mg by mouth at bedtime.   Yes Historical Provider, MD  sitaGLIPtin (JANUVIA) 50 MG tablet Take 50 mg by mouth daily.   Yes Historical Provider, MD  traZODone (DESYREL) 150 MG tablet Take 150 mg by mouth at bedtime.  03/27/15  Yes Historical Provider, MD  HYDROcodone-acetaminophen (NORCO/VICODIN) 5-325 MG tablet Take 1-2 tablets by mouth every 6 (six) hours as needed. 12/13/15   Lutricia FeilWilliam P Alese Furniss, PA-C  methylPREDNISolone (MEDROL DOSEPAK) 4 MG TBPK tablet Take per package instructions 12/13/15   Lutricia FeilWilliam P Shellsea Borunda, PA-C   Meds Ordered and Administered this Visit  Medications - No data to display  BP 152/64 mmHg  Pulse 82  Temp(Src) 98.1 F (36.7 C) (Oral)  Ht 5' 2.5" (1.588 m)  Wt 250 lb (113.399 kg)  BMI 44.97 kg/m2  SpO2 96% No data found.   Physical Exam  Constitutional: She is oriented to person, place, and time. She appears well-developed and well-nourished. No distress.  HENT:  Head: Normocephalic and atraumatic.  Right Ear: External ear normal.  Left Ear: External ear normal.  Nose: Nose normal.  Mouth/Throat: Oropharynx is clear and moist.  Examination of the  jaw shows no tenderness of the mandible or the maxillary regions. Tenderness is sharply localized over the TMJ on the left. No discomfort with movement of the auricle or the tragus. TMs are visualized and normal. Teeth do not show any this signs of abscess or infection. Mastication causes her pain as does a wide opening of her mouth.  Eyes: Conjunctivae are normal. Pupils are equal, round, and reactive to light.  Neck: Normal range of motion. Neck supple.  Musculoskeletal: Normal range of motion. She exhibits no edema or tenderness.  Lymphadenopathy:    She has no cervical adenopathy.  Neurological: She is alert and oriented to person, place, and time.  Skin: Skin is warm and dry. She is not diaphoretic.  Psychiatric: She has a normal mood and affect. Her behavior is normal. Judgment and thought content normal.  Nursing note and vitals reviewed.   ED Course  Procedures (including critical care time)  Labs Review Labs Reviewed - No data to display  Imaging Review No results found.   Visual Acuity Review  Right Eye Distance:   Left Eye Distance:   Bilateral Distance:    Right Eye Near:   Left Eye Near:    Bilateral Near:         MDM   1. TMJ (temporomandibular joint syndrome)    Discharge Medication List as of 12/13/2015  8:26 PM    START taking these medications   Details  HYDROcodone-acetaminophen (NORCO/VICODIN) 5-325 MG tablet Take 1-2 tablets by mouth every 6 (six) hours as needed., Starting 12/13/2015, Until Discontinued, Print    methylPREDNISolone (MEDROL DOSEPAK) 4 MG TBPK tablet Take per package instructions, Normal      Plan: 1. Test/x-ray results and diagnosis reviewed with patient 2. rx as per orders; risks, benefits, potential side effects reviewed with patient 3. Recommend supportive treatment with A mouth guard for a possible bruxism. She has multiple comorbidities which are going to interfere with treatment. Does have use of Plavix and aspirin it would  be unwise to place her on a nonsteroidal at this time so we will switch her over to a dose pack for relief of her pain and inflammation. She states that she is not allergic to hydrocodone although she does have an allergy to codeine and morphine. I have given her a very limited supply of Vicodin until the prednisone begins to take effect. She can use Tylenol as necessary. I've asked her to make an appointment with her dentist in next week evaluated and possibly be fitted for an appliance if he deems that necessary. I've also warned her that the prednisone will likely increase her blood sugars and  she will have to adjust for this. She will contact Dr. Gavin Potters tomorrow to let her know of our plan and to guide her insulin dosage  To optimize her coverage. She should be on a soft diet until she is feeling better and until she is seen and released by her dentist. 4. F/u prn if symptoms worsen or don't improve     Lutricia Feil, PA-C 12/13/15 2038

## 2016-02-03 ENCOUNTER — Ambulatory Visit: Payer: Medicare Other

## 2016-02-03 ENCOUNTER — Ambulatory Visit
Admission: EM | Admit: 2016-02-03 | Discharge: 2016-02-03 | Disposition: A | Discharge status: Home / Self Care | Payer: Medicare Other | Attending: Family Medicine | Admitting: Family Medicine

## 2016-02-03 ENCOUNTER — Encounter: Payer: Self-pay | Admitting: Emergency Medicine

## 2016-02-03 DIAGNOSIS — I10 Essential (primary) hypertension: Secondary | ICD-10-CM | POA: Insufficient documentation

## 2016-02-03 DIAGNOSIS — W19XXXA Unspecified fall, initial encounter: Secondary | ICD-10-CM | POA: Diagnosis not present

## 2016-02-03 DIAGNOSIS — H3530 Unspecified macular degeneration: Secondary | ICD-10-CM | POA: Insufficient documentation

## 2016-02-03 DIAGNOSIS — G2581 Restless legs syndrome: Secondary | ICD-10-CM | POA: Diagnosis not present

## 2016-02-03 DIAGNOSIS — S20229A Contusion of unspecified back wall of thorax, initial encounter: Secondary | ICD-10-CM

## 2016-02-03 DIAGNOSIS — K219 Gastro-esophageal reflux disease without esophagitis: Secondary | ICD-10-CM | POA: Diagnosis not present

## 2016-02-03 DIAGNOSIS — J449 Chronic obstructive pulmonary disease, unspecified: Secondary | ICD-10-CM | POA: Insufficient documentation

## 2016-02-03 DIAGNOSIS — J45909 Unspecified asthma, uncomplicated: Secondary | ICD-10-CM | POA: Diagnosis not present

## 2016-02-03 DIAGNOSIS — S20219A Contusion of unspecified front wall of thorax, initial encounter: Secondary | ICD-10-CM | POA: Insufficient documentation

## 2016-02-03 DIAGNOSIS — M549 Dorsalgia, unspecified: Secondary | ICD-10-CM | POA: Diagnosis present

## 2016-02-03 DIAGNOSIS — F419 Anxiety disorder, unspecified: Secondary | ICD-10-CM | POA: Diagnosis not present

## 2016-02-03 DIAGNOSIS — M797 Fibromyalgia: Secondary | ICD-10-CM | POA: Diagnosis not present

## 2016-02-03 DIAGNOSIS — M1990 Unspecified osteoarthritis, unspecified site: Secondary | ICD-10-CM | POA: Insufficient documentation

## 2016-02-03 DIAGNOSIS — G4730 Sleep apnea, unspecified: Secondary | ICD-10-CM | POA: Diagnosis not present

## 2016-02-03 DIAGNOSIS — E119 Type 2 diabetes mellitus without complications: Secondary | ICD-10-CM | POA: Diagnosis not present

## 2016-02-03 DIAGNOSIS — Z8673 Personal history of transient ischemic attack (TIA), and cerebral infarction without residual deficits: Secondary | ICD-10-CM | POA: Insufficient documentation

## 2016-02-03 MED ORDER — TRAMADOL HCL 50 MG PO TABS: 50.0000 mg | ORAL_TABLET | Freq: Four times a day (QID) | ORAL | Status: DC | PRN

## 2016-02-03 NOTE — ED Provider Notes (Signed)
CSN: [id number]     Arrival date & time 02/03/16  1358 History   First MD Initiated Contact with Patient 02/03/16 1427     Chief Complaint  Patient presents with  . Back Pain   (Consider location/radiation/quality/duration/timing/severity/associated sxs/prior Treatment) HPI Comments: 75 yo female with a c/o pain to lower ribs (right greater than left) as well as mid and low back pain after falling this morning at home. Patient states was pulling pants down and trying to sit on commode when she fell sideways hitting her sides and back. Denies hitting her head, loss of consciousness, numbness/tingling, chest pain, shortness of breath.   Patient is a 75 y.o. female presenting with back pain. The history is provided by the patient.  Back Pain   Past Medical History  Diagnosis Date  . Macular degeneration   . Complication of anesthesia     " feel like I'm dying after I wake up"  . Hypertension   . Dysrhythmia     unknown type   . Asthma     no problems since 1999 (after moving into a new home)  . Stroke (HCC) 2010    verbal aphasia x 30 min - resolved - no problem since then  . Arthritis   . Itching   . GERD (gastroesophageal reflux disease)   . Nocturia   . Hx of transfusion 2011  . Diabetes mellitus without complication (HCC)   . Sleep apnea     Dx 1990's unable to wear c-pap -  . Anxiety   . Diverticulosis   . Flat back syndrome   . Restless leg syndrome   . Anemia of chronic disease   . Fibromyalgia   . Chronic airway obstruction [facility](HCC)    Past Surgical History  Procedure Laterality Date  . Tonsillectomy    . Dilation and curettage of uterus    . Abdominal hysterectomy      rt so  . Hernia repair    . Breast reduction surgery    . Cholecystectomy  1990  . Cataracts removed    . Back surgery  2010 / 2011  . 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 Burr hole for subdural hematoma  2015  . Posterior laminectomy / decompression lumbar spine      with T10-S1 fusion   Family History  Problem Relation Age of Onset  . COPD Sister     had lung transplant  . COPD Brother    Social History  Substance Use Topics  . Smoking status: Never Smoker   . Smokeless tobacco: Never Used  . Alcohol Use: No   OB History    No data available     Review of Systems  Musculoskeletal: Positive for back pain.    Allergies  Codeine; Dilaudid; Demerol; Pravastatin; Lipitor; Nifedipine; Statins; Sulfa antibiotics; and Vancomycin  Home Medications   Prior to Admission medications   Medication Sig Start Date End Date Taking? Authorizing Provider  acetaminophen (TYLENOL) 500 MG tablet Take 1,000 mg by mouth every 6 (six) hours as needed for pain.    Historical Provider, MD  aspirin EC 81 MG tablet Take 81 mg by mouth daily.    Historical Provider, MD  beta carotene w/minerals (OCUVITE) tablet Take 1 tablet by mouth 2 (two) times daily.    Historical Provider, MD  carvedilol (COREG) 3.125  MG tablet Take 3.125 mg by mouth daily.    Historical Provider, MD  cephALEXin (KEFLEX) 500 MG capsule Take 1 capsule (500 mg total) by mouth every 8 (eight) hours. Patient taking differently: Take 1,000 mg by mouth 2 (two) times daily.  12/24/13   Jacquelynn Cree, PA-C  clopidogrel (PLAVIX) 75 MG tablet Take 75 mg by mouth daily.  04/18/15   Historical Provider, MD  furosemide (LASIX) 40 MG tablet Take 40 mg by mouth 2 (two) times daily.  04/18/15   Historical Provider, MD  gabapentin (NEURONTIN) 400 MG capsule Take 400 mg by mouth 2 (two) times daily.     Historical Provider, MD  hydrochlorothiazide (MICROZIDE) 12.5 MG capsule Take 12.5 mg by mouth daily.    Historical Provider, MD  HYDROcodone-acetaminophen (NORCO/VICODIN) 5-325 MG tablet Take 1-2 tablets by mouth every 6 (six) hours as needed. 12/13/15   Lutricia Feil, PA-C  insulin glargine  (LANTUS) 100 UNIT/ML injection Inject 58 Units into the skin at bedtime.    Historical Provider, MD  insulin lispro (HUMALOG) 100 UNIT/ML injection Inject 18 Units into the skin 3 (three) times daily with meals.    Historical Provider, MD  lisinopril (PRINIVIL,ZESTRIL) 5 MG tablet Take 5 mg by mouth at bedtime.  04/18/15   Historical Provider, MD  methylPREDNISolone (MEDROL DOSEPAK) 4 MG TBPK tablet Take per package instructions 12/13/15   Lutricia Feil, PA-C  Multiple Vitamin (MULTIVITAMIN WITH MINERALS) TABS tablet Take 1 tablet by mouth daily. 12/24/13   Jacquelynn Cree, PA-C  omeprazole (PRILOSEC) 20 MG capsule Take 20 mg by mouth daily.    Historical Provider, MD  PARoxetine (PAXIL) 20 MG tablet Take 40 mg by mouth at bedtime.    Historical Provider, MD  polyethylene glycol (MIRALAX / GLYCOLAX) packet Take 17 g by mouth daily as needed. 04/09/13   Avel Peace, PA-C  rosuvastatin (CRESTOR) 5 MG tablet Take 2.5 mg by mouth at bedtime.    Historical Provider, MD  sitaGLIPtin (JANUVIA) 50 MG tablet Take 50 mg by mouth daily.    Historical Provider, MD  traMADol (ULTRAM) 50 MG tablet Take 1 tablet (50 mg total) by mouth every 6 (six) hours as needed. 02/03/16   Payton Mccallum, MD  traZODone (DESYREL) 150 MG tablet Take 150 mg by mouth at bedtime.  03/27/15   Historical Provider, MD   Meds Ordered and Administered this Visit  Medications - No data to display  BP 108/46 mmHg  Pulse 86  Temp(Src) 98.1 F (36.7 C)  Resp 18  Ht  (1.575 m)  Wt 253 lb (114.76 kg)  BMI 46.26 kg/m2  SpO2 96% No data found.   Physical Exam  Constitutional: She is oriented to person, place, and time. She appears well-developed and well-nourished. No distress.  Cardiovascular: Normal rate, regular rhythm, normal heart sounds and intact distal pulses.   Pulmonary/Chest: Effort normal and breath sounds normal. No respiratory distress. She has no wheezes. She has no rales. She exhibits tenderness (over the right lower  lateral ribs area).  Musculoskeletal:       Thoracic back: She exhibits tenderness and bony tenderness. She exhibits normal range of motion, no swelling, no edema, no deformity, no laceration and normal pulse.       Lumbar back: She exhibits tenderness and bony tenderness. She exhibits no swelling, no edema, no laceration and normal pulse.  Neurological: She is alert and oriented to person, place, and time. No cranial nerve deficit.  Skin: She  is not diaphoretic. No erythema.  Nursing note and vitals reviewed.   ED Course  Procedures (including critical care time)  Labs Review Labs Reviewed - No data to display  Imaging Review Dg Ribs Unilateral W/chest Right  02/03/2016  CLINICAL DATA:  Fall today from sitting position, right rib pain EXAM: RIGHT RIBS AND CHEST - 3+ VIEW COMPARISON:  05/12/2015 FINDINGS: Five views right ribs submitted. Metallic fixation rods are partially visualized lower thoracic and lumbar spine. No acute infiltrate or pulmonary edema. No right rib fracture is identified. There is no pneumothorax. IMPRESSION: Negative. Electronically Signed   By: Natasha Mead M.D.   On: 02/03/2016 15:55   Dg Thoracic Spine 2 View  02/03/2016  CLINICAL DATA:  Acute upper back pain after fall today. EXAM: THORACIC SPINE 2 VIEWS COMPARISON:  None. FINDINGS: Status post surgical posterior fusion of lower thoracic and lumbar spine. No fracture or spondylolisthesis is noted. Mild osteophyte formation is noted in the mid thoracic spine. IMPRESSION: Postsurgical and degenerative changes are noted. No acute abnormality seen in the thoracic spine. Electronically Signed   By: Lupita Raider, M.D.   On: 02/03/2016 15:54   Dg Lumbar Spine Complete  02/03/2016  CLINICAL DATA:  Fall today.  Back pain. EXAM: LUMBAR SPINE - COMPLETE 4+ VIEW COMPARISON:  X-rays 12/09/2010, CT 12/11/2013 FINDINGS: Chronic compression fracture L1 is unchanged. No new fracture. Generalized osteopenia. Bilateral pedicle screw  and rod fusion T9 through the sacrum. There are screws across the SI joints bilaterally which show mild loosening. Interbody fusion with spacers at T12 through S1. Anterior screws to the disc space at L4-5 and L5-S1. Posterior bony fusion throughout the lumbar spine. IMPRESSION: Chronic compression fracture L1 unchanged.  No acute fracture. Fusion extending from T9 through the sacrum. Mild loosening of the S1 screws bilaterally. Electronically Signed   By: Marlan Palau M.D.   On: 02/03/2016 15:55     Visual Acuity Review  Right Eye Distance:   Left Eye Distance:   Bilateral Distance:    Right Eye Near:   Left Eye Near:    Bilateral Near:         MDM   1. Chest wall contusion, unspecified laterality, initial encounter   2. Back contusion, unspecified laterality, initial encounter      Discharge Medication List as of 02/03/2016  4:28 PM    START taking these medications   Details  traMADol (ULTRAM) 50 MG tablet Take 1 tablet (50 mg total) by mouth every 6 (six) hours as needed., Starting 02/03/2016, Until Discontinued, Normal       1. x-ray results and diagnosis reviewed with patient and daughter 2. rx as per orders above; reviewed possible side effects, interactions, risks and benefits  3. Recommend supportive treatment with rest, ice, otc analgesics prn 4. Follow-up prn if symptoms worsen or don't improve   Payton Mccallum, MD 02/03/16 1711

## 2016-02-03 NOTE — ED Notes (Signed)
Low back and thoracic pain after falling off commode this morning. When rising from floor she hit back and side a second time.

## 2017-01-21 ENCOUNTER — Emergency Department
Admission: EM | Admit: 2017-01-21 | Discharge: 2017-01-22 | Disposition: A | Discharge status: Home / Self Care | Payer: Medicare Other | Attending: Emergency Medicine | Admitting: Emergency Medicine

## 2017-01-21 ENCOUNTER — Emergency Department: Payer: Medicare Other

## 2017-01-21 ENCOUNTER — Encounter: Payer: Self-pay | Admitting: Emergency Medicine

## 2017-01-21 DIAGNOSIS — Z8673 Personal history of transient ischemic attack (TIA), and cerebral infarction without residual deficits: Secondary | ICD-10-CM | POA: Diagnosis not present

## 2017-01-21 DIAGNOSIS — J45909 Unspecified asthma, uncomplicated: Secondary | ICD-10-CM | POA: Diagnosis not present

## 2017-01-21 DIAGNOSIS — Z79899 Other long term (current) drug therapy: Secondary | ICD-10-CM | POA: Insufficient documentation

## 2017-01-21 DIAGNOSIS — S0003XA Contusion of scalp, initial encounter: Secondary | ICD-10-CM | POA: Insufficient documentation

## 2017-01-21 DIAGNOSIS — Y92000 Kitchen of unspecified non-institutional (private) residence as  the place of occurrence of the external cause: Secondary | ICD-10-CM | POA: Insufficient documentation

## 2017-01-21 DIAGNOSIS — W1839XA Other fall on same level, initial encounter: Secondary | ICD-10-CM | POA: Diagnosis not present

## 2017-01-21 DIAGNOSIS — Y939 Activity, unspecified: Secondary | ICD-10-CM | POA: Diagnosis not present

## 2017-01-21 DIAGNOSIS — E119 Type 2 diabetes mellitus without complications: Secondary | ICD-10-CM | POA: Insufficient documentation

## 2017-01-21 DIAGNOSIS — M546 Pain in thoracic spine: Secondary | ICD-10-CM | POA: Insufficient documentation

## 2017-01-21 DIAGNOSIS — Y999 Unspecified external cause status: Secondary | ICD-10-CM | POA: Diagnosis not present

## 2017-01-21 DIAGNOSIS — Z794 Long term (current) use of insulin: Secondary | ICD-10-CM | POA: Diagnosis not present

## 2017-01-21 DIAGNOSIS — M545 Low back pain, unspecified: Secondary | ICD-10-CM

## 2017-01-21 DIAGNOSIS — I10 Essential (primary) hypertension: Secondary | ICD-10-CM | POA: Diagnosis not present

## 2017-01-21 DIAGNOSIS — S0990XA Unspecified injury of head, initial encounter: Secondary | ICD-10-CM | POA: Diagnosis present

## 2017-01-21 MED ORDER — HYDROCODONE-ACETAMINOPHEN 5-325 MG PO TABS
1.0000 | ORAL_TABLET | Freq: Once | ORAL | Status: AC
  Administered 2017-01-21: 1 via ORAL
  Filled 2017-01-21: qty 1

## 2017-01-21 MED ORDER — TRAMADOL HCL 50 MG PO TABS: 50.0000 mg | ORAL_TABLET | Freq: Four times a day (QID) | ORAL | 0 refills | Status: AC | PRN

## 2017-01-21 MED ORDER — CYCLOBENZAPRINE HCL 5 MG PO TABS: 5.0000 mg | ORAL_TABLET | Freq: Three times a day (TID) | ORAL | 0 refills | Status: DC | PRN

## 2017-01-21 NOTE — ED Triage Notes (Signed)
Pt lost her balance and fell hitting right side of head against a door, co pain to area. Also co pain to left mid back with tenderness on palpation.

## 2017-01-21 NOTE — ED Provider Notes (Signed)
[facility] Emergency Department Provider Note   ____________________________________________   I have reviewed the triage vital signs and the nursing notes.   HISTORY  Chief Complaint Head Injury    HPI Cheyenne Frank is a 76 y.o. female presents with  Head injury following a fall earlier today. Patient describes losing her footing and falling. She fell into the wall and trash can hitting the back of her head and right side of the thoracic region of her back. Pt has a history of thoracic and lumbar fusions and subdural hematoma. Patient denies LOC at time of fall or anytime since the fall. She reports having a headache, muscle pain along the cervical paraspinals, trapezius and right thoracic paraspinals. Pt denies vision changes, N/V, lethargy, extremity numbness/tingling, bowel/bladder dysfunction, saddle anesthesia or extremity weakness.    Past Medical History:  Diagnosis Date  . Anemia of chronic disease   . Anxiety   . Arthritis   . Asthma    no problems since 1999 (after moving into a new home)  . Chronic airway obstruction (HCC)   . Complication of anesthesia    " feel like I'm dying after I wake up"  . Diabetes mellitus without complication (HCC)   . Diverticulosis   . Dysrhythmia    unknown type   . Fibromyalgia   . Flat back syndrome   . GERD (gastroesophageal reflux disease)   . Hx of transfusion 2011  . Hypertension   . Itching   . Macular degeneration   . Nocturia   . Restless leg syndrome   . Sleep apnea    Dx 1990's unable to wear c-pap -  . Stroke (HCC) 2010   verbal aphasia x 30 min - resolved - no problem since then    Patient Active Problem List   Diagnosis Date Noted  . SDH (subdural hematoma) (HCC) 12/11/2013  . Postoperative wound infection--lumbar spine/2015 12/11/2013  . CVA (cerebral infarction) 12/11/2013  . Postoperative anemia due to acute blood loss 04/09/2013  . Diabetes (HCC) 04/07/2013  . Hypertension  04/07/2013  . Unspecified sleep apnea 04/07/2013  . OA (osteoarthritis) of knee 04/06/2013    Past Surgical History:  Procedure Laterality Date  . ABDOMINAL HYSTERECTOMY     rt so  . BACK SURGERY  2010 / 2011  . BREAST REDUCTION SURGERY    . BURR HOLE FOR SUBDURAL HEMATOMA  2015  . cataracts removed    . CHOLECYSTECTOMY  1990  . DILATION AND CURETTAGE OF UTERUS    . HERNIA REPAIR    . POSTERIOR LAMINECTOMY / DECOMPRESSION LUMBAR SPINE     with T10-S1 fusion  . TONSILLECTOMY    . TOTAL KNEE ARTHROPLASTY Left 04/06/2013   Procedure: LEFT TOTAL KNEE ARTHROPLASTY;  Surgeon: Loanne Drilling, MD;  Location: [facility];  Service: Orthopedics;  Laterality: Left;    Prior to Admission medications   Medication Sig Start Date End Date Taking? Authorizing Provider  acetaminophen (TYLENOL) 500 MG tablet Take 1,000 mg by mouth every 6 (six) hours as needed for pain.    [provider]  aspirin EC 81 MG tablet Take 81 mg by mouth daily.    [provider]  beta carotene w/minerals (OCUVITE) tablet Take 1 tablet by mouth 2 (two) times daily.    [provider]  carvedilol (COREG) 3.125 MG tablet Take 3.125 mg by mouth daily.    [provider]  cephALEXin (KEFLEX) 500 MG capsule Take 1 capsule (500  mg total) by mouth every 8 (eight) hours. Patient taking differently: Take 1,000 mg by mouth 2 (two) times daily.  12/24/13   Love, Evlyn Kanner, PA-C  clopidogrel (PLAVIX) 75 MG tablet Take 75 mg by mouth daily.  04/18/15   [provider]  cyclobenzaprine (FLEXERIL) 5 MG tablet Take 1 tablet (5 mg total) by mouth 3 (three) times daily as needed for muscle spasms. 01/21/17   Lamone Ferrelli M, PA-C  furosemide (LASIX) 40 MG tablet Take 40 mg by mouth 2 (two) times daily.  04/18/15   [provider]  gabapentin (NEURONTIN) 400 MG capsule Take 400 mg by mouth 2 (two) times daily.     [provider]  hydrochlorothiazide (MICROZIDE) 12.5 MG capsule Take 12.5 mg  by mouth daily.    [provider]  HYDROcodone-acetaminophen (NORCO/VICODIN) 5-325 MG tablet Take 1-2 tablets by mouth every 6 (six) hours as needed. 12/13/15   Lutricia Feil, PA-C  insulin glargine (LANTUS) 100 UNIT/ML injection Inject 58 Units into the skin at bedtime.    [provider]  insulin lispro (HUMALOG) 100 UNIT/ML injection Inject 18 Units into the skin 3 (three) times daily with meals.    [provider]  lisinopril (PRINIVIL,ZESTRIL) 5 MG tablet Take 5 mg by mouth at bedtime.  04/18/15   [provider]  methylPREDNISolone (MEDROL DOSEPAK) 4 MG TBPK tablet Take per package instructions 12/13/15   Lutricia Feil, PA-C  Multiple Vitamin (MULTIVITAMIN WITH MINERALS) TABS tablet Take 1 tablet by mouth daily. 12/24/13   Love, Evlyn Kanner, PA-C  omeprazole (PRILOSEC) 20 MG capsule Take 20 mg by mouth daily.    [provider]  PARoxetine (PAXIL) 20 MG tablet Take 40 mg by mouth at bedtime.    [provider]  polyethylene glycol (MIRALAX / GLYCOLAX) packet Take 17 g by mouth daily as needed. 04/09/13   Perkins, Alexzandrew L, PA-C  rosuvastatin (CRESTOR) 5 MG tablet Take 2.5 mg by mouth at bedtime.    [provider]  sitaGLIPtin (JANUVIA) 50 MG tablet Take 50 mg by mouth daily.    [provider]  traMADol (ULTRAM) 50 MG tablet Take 1 tablet (50 mg total) by mouth every 6 (six) hours as needed for moderate pain (Take 1 and if no relief take 2nd tablet repeat Q6H). 01/21/17 01/21/18  Kadin Bera M, PA-C  traZODone (DESYREL) 150 MG tablet Take 150 mg by mouth at bedtime.  03/27/15   [provider]    Allergies Codeine; Dilaudid [hydromorphone]; Demerol [meperidine]; Pravastatin; Lipitor [atorvastatin]; Nifedipine; Statins; Sulfa antibiotics; and Vancomycin  Family History  Problem Relation Age of Onset  . COPD Sister     had lung transplant  . COPD Brother     Social History Social History  Substance Use  Topics  . Smoking status: Never Smoker  . Smokeless tobacco: Never Used  . Alcohol use No    Review of Systems Constitutional: No fever/chills Eyes: No visual changes. ENT: No sore throat. Cardiovascular: Denies chest pain. Respiratory: Denies cough or SOB Gastrointestinal: No abdominal pain.  No nausea, no vomiting.   Genitourinary: Negative for dysuria. Musculoskeletal: Thoracic and lumbar back pain. Skin: Negative for rash. Neurological: Headache  ____________________________________________   PHYSICAL EXAM:  VITAL SIGNS: ED Triage Vitals  Enc Vitals Group     BP 01/21/17 1941 (!) 142/59     Pulse Rate 01/21/17 1941 74     Resp 01/21/17 1941 18     Temp 01/21/17  1941 98.9 F (37.2 C)     Temp Source 01/21/17 1941 Oral     SpO2 01/21/17 1941 96 %     Weight 01/21/17 1942 250 lb (113.4 kg)     Height 01/21/17 1942 5\' 2"  (1.575 m)     Head Circumference --      Peak Flow --      Pain Score 01/21/17 1940 10     Pain Loc --      Pain Edu? --      Excl. in GC? --     Constitutional: Alert and oriented. Well appearing and in no acute distress. Eyes: Subconjunctival hemorrhage 2/2 eye injection earlier today Head: Contusion posterior scalp Nose: No congestion/rhinnorhea. Mouth/Throat: Mucous membranes are moist.   Cardiovascular: Normal rate, regular rhythm.  Good peripheral circulation. Respiratory: Normal respiratory effort.  No retractions.  Musculoskeletal: Thoracic back pain, Strength, ROM and sensation intact in all extremities within functional limits.  Neurologic:  Speech and language at baseline. No gross focal neurologic deficits are appreciated.  Skin:  Skin is warm, dry and intact except for abrasions along right side posterior thoracic. No rash noted.  Psychiatric: Mood and affect are normal. Speech and behavior are normal.  ____________________________________________   LABS (all labs ordered are listed, but only abnormal results are  displayed)  Labs Reviewed - No data to display ____________________________________________  EKG none  ____________________________________________  RADIOLOGY CT head wo contrast IMPRESSION: 1. No evidence of traumatic intracranial injury or fracture. 2. No evidence of fracture or subluxation along the cervical spine. 3. Chronic lacunar infarct at the posterior limb of the left internal capsule. 4. Mild cortical volume loss noted. 5. Mild degenerative change along the cervical spine.  CT Cervical spine wo contrast IMPRESSION: 1. No evidence of traumatic intracranial injury or fracture. 2. No evidence of fracture or subluxation along the cervical spine. 3. Chronic lacunar infarct at the posterior limb of the left internal capsule. 4. Mild cortical volume loss noted. 5. Mild degenerative change along the cervical spine.  DG lumbar and thoracic IMPRESSION: No evidence of acute fracture or subluxation along the lumbar spine. Thoracolumbosacral spinal fusion hardware, with screws extending across the sacroiliac joints. Chronic compression deformity of vertebral body L2.  IMPRESSION: No evidence of fracture or subluxation along the thoracic spine. Thoracolumbar spinal fusion hardware partially imaged.  ____________________________________________   PROCEDURES  Procedure(s) performed: no    Critical Care performed: no ____________________________________________   INITIAL IMPRESSION / ASSESSMENT AND PLAN / ED COURSE  Pertinent labs & imaging results that were available during my care of the patient were reviewed by me and considered in my medical decision making (see chart for details).  Patient symptoms consistent with muscle strain and contusion as a result of a fall. Physical exam and imaging findings are reassuring of no spinal fractures, acute cerebral hemorrhage or acute neurological changes. Patient noted improvement of symptoms with pain medication provided  during ED course. Patient will be given prescription for pain management and encouraged to follow up with her PCP as needed. Patient also advised to return to emergency department if symptoms worsened.       ____________________________________________   FINAL CLINICAL IMPRESSION(S) / ED DIAGNOSES  Final diagnoses:  Contusion of scalp, initial encounter  Thoracolumbar back pain      NEW MEDICATIONS STARTED DURING THIS VISIT:  New Prescriptions   CYCLOBENZAPRINE (FLEXERIL) 5 MG TABLET    Take 1 tablet (5 mg total) by mouth 3 (three) times daily as  needed for muscle spasms.   TRAMADOL (ULTRAM) 50 MG TABLET    Take 1 tablet (50 mg total) by mouth every 6 (six) hours as needed for moderate pain (Take 1 and if no relief take 2nd tablet repeat Q6H).     Note:  This document was prepared using Dragon voice recognition software and may include unintentional dictation errors.   Sevana Grandinetti, Jordan Likes, PA-C 01/21/17 2337    Merrily Brittle, MD 01/22/17 [phone]

## 2017-01-21 NOTE — Discharge Instructions (Signed)
Take medication as prescribed. Return to emergency department if symptoms worsen and follow-up with PCP as needed.   °

## 2017-01-21 NOTE — ED Notes (Addendum)
Pt fell today in the kitchen.  Pt lost her footing and hit the floor.  Pt has abrasion to right mid back and and bruise to left mid back.  Pt also has a headache and neck pain. .  No loc no vomiting.  Pt had eye injection to left eye for macular degeneration. Pt alert  Speech clear.  Pt is on blood thinners.  Hx cva.

## 2017-01-21 NOTE — ED Notes (Signed)
Patient transported to X-ray via stretcher 

## 2017-03-30 ENCOUNTER — Emergency Department
Admission: EM | Admit: 2017-03-30 | Discharge: 2017-03-30 | Disposition: A | Discharge status: Home / Self Care | Payer: Medicare Other | Attending: Emergency Medicine | Admitting: Emergency Medicine

## 2017-03-30 ENCOUNTER — Emergency Department: Payer: Medicare Other

## 2017-03-30 DIAGNOSIS — Z79899 Other long term (current) drug therapy: Secondary | ICD-10-CM | POA: Insufficient documentation

## 2017-03-30 DIAGNOSIS — M549 Dorsalgia, unspecified: Secondary | ICD-10-CM | POA: Insufficient documentation

## 2017-03-30 DIAGNOSIS — I10 Essential (primary) hypertension: Secondary | ICD-10-CM | POA: Insufficient documentation

## 2017-03-30 DIAGNOSIS — Z794 Long term (current) use of insulin: Secondary | ICD-10-CM | POA: Insufficient documentation

## 2017-03-30 DIAGNOSIS — J45909 Unspecified asthma, uncomplicated: Secondary | ICD-10-CM | POA: Insufficient documentation

## 2017-03-30 DIAGNOSIS — R51 Headache: Secondary | ICD-10-CM | POA: Diagnosis present

## 2017-03-30 DIAGNOSIS — M542 Cervicalgia: Secondary | ICD-10-CM | POA: Insufficient documentation

## 2017-03-30 DIAGNOSIS — E119 Type 2 diabetes mellitus without complications: Secondary | ICD-10-CM | POA: Insufficient documentation

## 2017-03-30 DIAGNOSIS — Z7982 Long term (current) use of aspirin: Secondary | ICD-10-CM | POA: Insufficient documentation

## 2017-03-30 DIAGNOSIS — Z7984 Long term (current) use of oral hypoglycemic drugs: Secondary | ICD-10-CM | POA: Diagnosis not present

## 2017-03-30 DIAGNOSIS — Z7902 Long term (current) use of antithrombotics/antiplatelets: Secondary | ICD-10-CM | POA: Insufficient documentation

## 2017-03-30 DIAGNOSIS — W19XXXA Unspecified fall, initial encounter: Secondary | ICD-10-CM | POA: Insufficient documentation

## 2017-03-30 DIAGNOSIS — R0789 Other chest pain: Secondary | ICD-10-CM | POA: Diagnosis not present

## 2017-03-30 MED ORDER — OXYCODONE-ACETAMINOPHEN 5-325 MG PO TABS
1.0000 | ORAL_TABLET | Freq: Once | ORAL | Status: AC
  Administered 2017-03-30: 1 via ORAL
  Filled 2017-03-30: qty 1

## 2017-03-30 MED ORDER — OXYCODONE-ACETAMINOPHEN 5-325 MG PO TABS: 1.0000 | ORAL_TABLET | Freq: Three times a day (TID) | ORAL | 0 refills | Status: AC | PRN

## 2017-03-30 NOTE — ED Triage Notes (Signed)
Pt states she tripped over her feet. Pt states she fell landing on right side. Pt complains of right upper back pain, headache, neck pain, bilateral arm pain. Pt appears in no acute distress. Pt denies loc. Pt moving all extremities. No obvious injuries noted.

## 2017-03-30 NOTE — ED Notes (Signed)
Unsuccessful IV start x1, LAC

## 2017-04-01 NOTE — ED Provider Notes (Signed)
[facility] Emergency Department Provider Note  ____________________________________________  Time seen: Approximately 7:53 PM  I have reviewed the triage vital signs and the nursing notes.   HISTORY  Chief Complaint Fall    HPI Cheyenne Frank is a 76 y.o. female presenting to the emergency department after a fall. Patient reports 10 out of 10 headache, upper back pain, lower back pain, right anterior rib pain and neck pain. Patient experienced no loss of consciousness. She denies new blurry vision, nausea, vomiting, abdominal pain or disorientation. Patient is accompanied by her two daughters, who have noticed no changes in behavior. Patient denies weakness, radiculopathy or changes in sensation of the upper or lower extremities. No alleviating measures have been attempted prior to presenting to the emergency department.   Past Medical History:  Diagnosis Date  . Anemia of chronic disease   . Anxiety   . Arthritis   . Asthma    no problems since 1999 (after moving into a new home)  . Chronic airway obstruction (HCC)   . Complication of anesthesia    " feel like I'm dying after I wake up"  . Diabetes mellitus without complication (HCC)   . Diverticulosis   . Dysrhythmia    unknown type   . Fibromyalgia   . Flat back syndrome   . GERD (gastroesophageal reflux disease)   . Hx of transfusion 2011  . Hypertension   . Itching   . Macular degeneration   . Nocturia   . Restless leg syndrome   . Sleep apnea    Dx 1990's unable to wear c-pap -  . Stroke (HCC) 2010   verbal aphasia x 30 min - resolved - no problem since then    Patient Active Problem List   Diagnosis Date Noted  . SDH (subdural hematoma) (HCC) 12/11/2013  . Postoperative wound infection--lumbar spine/2015 12/11/2013  . CVA (cerebral infarction) 12/11/2013  . Postoperative anemia due to acute blood loss 04/09/2013  . Diabetes (HCC) 04/07/2013  . Hypertension 04/07/2013  .  Unspecified sleep apnea 04/07/2013  . OA (osteoarthritis) of knee 04/06/2013    Past Surgical History:  Procedure Laterality Date  . ABDOMINAL HYSTERECTOMY     rt so  . BACK SURGERY  2010 / 2011  . BREAST REDUCTION SURGERY    . BURR HOLE FOR SUBDURAL HEMATOMA  2015  . cataracts removed    . CHOLECYSTECTOMY  1990  . DILATION AND CURETTAGE OF UTERUS    . HERNIA REPAIR    . POSTERIOR LAMINECTOMY / DECOMPRESSION LUMBAR SPINE     with T10-S1 fusion  . TONSILLECTOMY    . TOTAL KNEE ARTHROPLASTY Left 04/06/2013   Procedure: LEFT TOTAL KNEE ARTHROPLASTY;  Surgeon: Loanne Drilling, MD;  Location: [facility];  Service: Orthopedics;  Laterality: Left;    Prior to Admission medications   Medication Sig Start Date End Date Taking? Authorizing Provider  acetaminophen (TYLENOL) 500 MG tablet Take 1,000 mg by mouth every 6 (six) hours as needed for pain.    [provider]  aspirin EC 81 MG tablet Take 81 mg by mouth daily.    [provider]  beta carotene w/minerals (OCUVITE) tablet Take 1 tablet by mouth 2 (two) times daily.    [provider]  carvedilol (COREG) 3.125 MG tablet Take 3.125 mg by mouth daily.    [provider]  cephALEXin (KEFLEX) 500 MG capsule Take 1 capsule (500 mg total) by mouth every 8 (eight) hours. Patient  taking differently: Take 1,000 mg by mouth 2 (two) times daily.  12/24/13   Love, Evlyn Kanner, PA-C  clopidogrel (PLAVIX) 75 MG tablet Take 75 mg by mouth daily.  04/18/15   [provider]  cyclobenzaprine (FLEXERIL) 5 MG tablet Take 1 tablet (5 mg total) by mouth 3 (three) times daily as needed for muscle spasms. 01/21/17   Little, Traci M, PA-C  furosemide (LASIX) 40 MG tablet Take 40 mg by mouth 2 (two) times daily.  04/18/15   [provider]  gabapentin (NEURONTIN) 400 MG capsule Take 400 mg by mouth 2 (two) times daily.     [provider]  hydrochlorothiazide (MICROZIDE) 12.5 MG capsule Take 12.5 mg by mouth daily.     [provider]  HYDROcodone-acetaminophen (NORCO/VICODIN) 5-325 MG tablet Take 1-2 tablets by mouth every 6 (six) hours as needed. 12/13/15   Lutricia Feil, PA-C  insulin glargine (LANTUS) 100 UNIT/ML injection Inject 58 Units into the skin at bedtime.    [provider]  insulin lispro (HUMALOG) 100 UNIT/ML injection Inject 18 Units into the skin 3 (three) times daily with meals.    [provider]  lisinopril (PRINIVIL,ZESTRIL) 5 MG tablet Take 5 mg by mouth at bedtime.  04/18/15   [provider]  methylPREDNISolone (MEDROL DOSEPAK) 4 MG TBPK tablet Take per package instructions 12/13/15   Lutricia Feil, PA-C  Multiple Vitamin (MULTIVITAMIN WITH MINERALS) TABS tablet Take 1 tablet by mouth daily. 12/24/13   Love, Evlyn Kanner, PA-C  omeprazole (PRILOSEC) 20 MG capsule Take 20 mg by mouth daily.    [provider]  oxyCODONE-acetaminophen (ROXICET) 5-325 MG tablet Take 1 tablet by mouth every 8 (eight) hours as needed for severe pain. 03/30/17 04/03/17  Orvil Feil, PA-C  PARoxetine (PAXIL) 20 MG tablet Take 40 mg by mouth at bedtime.    [provider]  polyethylene glycol (MIRALAX / GLYCOLAX) packet Take 17 g by mouth daily as needed. 04/09/13   Perkins, Alexzandrew L, PA-C  rosuvastatin (CRESTOR) 5 MG tablet Take 2.5 mg by mouth at bedtime.    [provider]  sitaGLIPtin (JANUVIA) 50 MG tablet Take 50 mg by mouth daily.    [provider]  traMADol (ULTRAM) 50 MG tablet Take 1 tablet (50 mg total) by mouth every 6 (six) hours as needed for moderate pain (Take 1 and if no relief take 2nd tablet repeat Q6H). 01/21/17 01/21/18  Little, Traci M, PA-C  traZODone (DESYREL) 150 MG tablet Take 150 mg by mouth at bedtime.  03/27/15   [provider]    Allergies Codeine; Dilaudid [hydromorphone]; Demerol [meperidine]; Pravastatin; Lipitor [atorvastatin]; Nifedipine; Statins; Sulfa antibiotics; and Vancomycin  Family  History  Problem Relation Age of Onset  . COPD Sister        had lung transplant  . COPD Brother     Social History Social History  Substance Use Topics  . Smoking status: Never Smoker  . Smokeless tobacco: Never Used  . Alcohol use No     Review of Systems  Constitutional: No fever/chills Eyes: No visual changes. No discharge ENT: No upper respiratory complaints. Cardiovascular: no chest pain. Respiratory: no cough. No SOB. Gastrointestinal: No abdominal pain.  No nausea, no vomiting.  No diarrhea.  No constipation. Genitourinary: Negative for dysuria. No hematuria Musculoskeletal: She has low back pain, upper back pain, neck pain Skin: Negative for rash, abrasions, lacerations, ecchymosis. Neurological: She has headache, no focal weakness or numbness.  ____________________________________________   PHYSICAL EXAM:  VITAL SIGNS: ED Triage Vitals [03/30/17 1951]  Enc Vitals Group     BP (!) 156/88     Pulse Rate 74     Resp 14     Temp 98 F (36.7 C)     Temp Source Oral     SpO2 96 %     Weight 250 lb (113.4 kg)     Height 5\' 2"  (1.575 m)     Head Circumference      Peak Flow      Pain Score 8     Pain Loc      Pain Edu?      Excl. in GC?     Constitutional: Alert and oriented. Patient is talkative and engaged.  Eyes: Palpebral and bulbar conjunctiva are nonerythematous bilaterally. PERRL. EOMI.  Head: Atraumatic. ENT:      Ears: Tympanic membranes are pearly bilaterally without bloody effusion visualized.       Nose: Nasal septum is midline without evidence of blood or septal hematoma.      Mouth/Throat: Mucous membranes are moist. Uvula is midline. Neck: Full range of motion. No pain with neck flexion. No pain with palpation of the cervical spine.  Cardiovascular: No pain with palpation over the anterior and posterior chest wall. Normal rate, regular rhythm. Normal S1 and S2. No murmurs, gallops or rubs auscultated.  Respiratory: Trachea is midline.  Resonant and symmetric percussion tones bilaterally. On auscultation, adventitious sounds are absent.  Gastrointestinal:Abdomen is symmetric. Bowel sounds positive in all 4 quadrants. Musculature soft and relaxed to light palpation. No masses or areas of tenderness to deep palpation. No costovertebral angle tenderness bilaterally.  Musculoskeletal: Patient has 5/5 strength in the upper and lower extremities bilaterally. Full range of motion at the shoulder, elbow and wrist bilaterally. Full range of motion at the hip, knee and ankle bilaterally. No changes in gait. Palpable radial, ulnar and dorsalis pedis pulses bilaterally and symmetrically. Neurologic: Normal speech and language. No gross focal neurologic deficits are appreciated. Cranial nerves: 2-10 normal as tested. Cerebellar: Finger-nose-finger WNL, heel to shin WNL. Sensorimotor: No sensory loss or abnormal reflexes. Vision: No visual field deficts noted to confrontation.  Speech: No dysarthria or expressive aphasia.  Skin:  Skin is warm, dry and intact. No rash or bruising noted.  Psychiatric: Mood and affect are normal for age. Speech and behavior are normal.     ____________________________________________   LABS (all labs ordered are listed, but only abnormal results are displayed)  Labs Reviewed - No data to display ____________________________________________  EKG   ____________________________________________  RADIOLOGY Geraldo Pitter, personally viewed and evaluated these images as part of my medical decision making, as well as reviewing the written report by the radiologist.  DG thoracic spine: DG ribs unilateral w/chest right DG lumbar spine complete CT cervical spine wo contrast CT head wo contrast:  No acute abnormality  No results found.  ____________________________________________    PROCEDURES  Procedure(s) performed:    Procedures    Medications  oxyCODONE-acetaminophen  (PERCOCET/ROXICET) 5-325 MG per tablet 1 tablet (1 tablet Oral Given 03/30/17 2223)     ____________________________________________   INITIAL IMPRESSION / ASSESSMENT AND PLAN / ED COURSE  Pertinent labs & imaging results that were available during my care of the patient were reviewed by me and considered in my medical decision making (see chart for details).  Review of the [place] CSRS was performed in accordance of the [facility] prior to dispensing  any controlled drugs.    Assessment and plan: Fall Patient presents to the emergency department after a fall. Patient experienced no loss of consciousness. CT head and CT cervical spine revealed no acute intracranial abnormality or acute fractures. DG thoracic spin,e DG ribs unilateral with chest right and DG lumbar spine also revealed no acute fractures or bony abnormalities. Neurologic exam and overall physical exam is reassuring. Patient was given a Roxicet in the emergency department. She was discharged with a short course of Roxicet. Patient lives with her 2 daughters and has adequate social support at home. Vital signs are reassuring prior to discharge. All patient questions were answered.   ____________________________________________  FINAL CLINICAL IMPRESSION(S) / ED DIAGNOSES  Final diagnoses:  Fall, initial encounter      NEW MEDICATIONS STARTED DURING THIS VISIT:  Discharge Medication List as of 03/30/2017 10:02 PM    START taking these medications   Details  oxyCODONE-acetaminophen (ROXICET) 5-325 MG tablet Take 1 tablet by mouth every 8 (eight) hours as needed for severe pain., Starting Sat 03/30/2017, Until Wed 04/03/2017, Print            This chart was dictated using voice recognition software/Dragon. Despite best efforts to proofread, errors can occur which can change the meaning. Any change was purely unintentional.    Gasper LloydWoods, Jonika Critz M, PA-C 04/01/17 Chaney Malling2002    Goodman, Graydon, MD 04/01/17 [phone]

## 2018-01-09 ENCOUNTER — Ambulatory Visit
Admission: EM | Admit: 2018-01-09 | Discharge: 2018-01-09 | Disposition: A | Discharge status: Home / Self Care | Payer: Medicare Other | Attending: Family Medicine | Admitting: Family Medicine

## 2018-01-09 ENCOUNTER — Ambulatory Visit
Admit: 2018-01-09 | Discharge: 2018-01-09 | Disposition: A | Discharge status: Home / Self Care | Payer: Medicare Other | Attending: Family Medicine | Admitting: Family Medicine

## 2018-01-09 ENCOUNTER — Ambulatory Visit: Payer: Medicare Other

## 2018-01-09 DIAGNOSIS — M545 Low back pain, unspecified: Secondary | ICD-10-CM

## 2018-01-09 DIAGNOSIS — M8938 Hypertrophy of bone, other site: Secondary | ICD-10-CM | POA: Insufficient documentation

## 2018-01-09 DIAGNOSIS — M4802 Spinal stenosis, cervical region: Secondary | ICD-10-CM | POA: Diagnosis not present

## 2018-01-09 DIAGNOSIS — M542 Cervicalgia: Secondary | ICD-10-CM | POA: Insufficient documentation

## 2018-01-09 DIAGNOSIS — S32028A Other fracture of second lumbar vertebra, initial encounter for closed fracture: Secondary | ICD-10-CM | POA: Diagnosis not present

## 2018-01-09 DIAGNOSIS — W19XXXA Unspecified fall, initial encounter: Secondary | ICD-10-CM | POA: Diagnosis not present

## 2018-01-09 DIAGNOSIS — M25512 Pain in left shoulder: Secondary | ICD-10-CM

## 2018-01-09 DIAGNOSIS — M19012 Primary osteoarthritis, left shoulder: Secondary | ICD-10-CM | POA: Insufficient documentation

## 2018-01-09 MED ORDER — OXYCODONE-ACETAMINOPHEN 5-325 MG PO TABS: 0.5000 | ORAL_TABLET | Freq: Two times a day (BID) | ORAL | 0 refills | Status: DC | PRN

## 2018-01-09 NOTE — ED Provider Notes (Signed)
[facility] ____________________________________________  Time seen: Approximately 5:49 PM  I have reviewed the triage vital signs and the nursing notes.   HISTORY  Chief Complaint Muscle Pain   HPI Cheyenne Frank is a 77 y.o. female presenting with daughter at bedside, with past medical history of diabetes, hypertension, fibromyalgia, multiple falls, CVA in 2010, subdural hematoma presenting for continued neck and low back pain after fall that occurred approximately 2 weeks ago.  Patient daughter reports that approximately 3 days after the fall she was seen by her primary care and had multiple outpatient images  which were negative.  Reports at that time had imaging of head, chest and abdomen as well as right hand.  Reports since the fall she has continued with left sided neck pain that is present with movement, minimal at rest, and states pain was from the fall.  Also reports some low back pain since the fall as well, however reports does have a history of chronic low back pain that intermittently does flareup.  Has previously had low back laminectomy.  Denies any urinary or bowel retention or incontinence.  Denies other recent fall since previous evaluation.   No over-the-counter medications taken for the same complaints. Reports she has a chronic history of intermittent dizziness that has been continuously present even before and after the fall, denies any current dizziness.  Denies any recent headaches, vision changes, confusion spells, atypical behavior, acute change of gait, unilateral weakness, syncope or near syncope. Patient daughter reports that this fall from 2 weeks ago occurred as she was holding onto the door handle and her other daughter open the door causing her to fall flat backwards.  No loss of consciousness.  Has remained ambulatory since event.  Does ambulate at home with a walker and has unsteady baseline gait.  Denies other aggravating or alleviating  factors.  Denies chest pain, shortness of breath, abdominal pain, extremity swelling, dysuria, or rash. Denies recent sickness. Denies recent antibiotic use.   Verner Mould, MD: PCP   Past Medical History:  Diagnosis Date  . Anemia of chronic disease   . Anxiety   . Arthritis   . Asthma    no problems since 1999 (after moving into a new home)  . Chronic airway obstruction (HCC)   . Complication of anesthesia    " feel like I'm dying after I wake up"  . Diabetes mellitus without complication (HCC)   . Diverticulosis   . Dysrhythmia    unknown type   . Fibromyalgia   . Flat back syndrome   . GERD (gastroesophageal reflux disease)   . Hx of transfusion 2011  . Hypertension   . Itching   . Macular degeneration   . Nocturia   . Restless leg syndrome   . Sleep apnea    Dx 1990's unable to wear c-pap -  . Stroke (HCC) 2010   verbal aphasia x 30 min - resolved - no problem since then    Patient Active Problem List   Diagnosis Date Noted  . SDH (subdural hematoma) (HCC) 12/11/2013  . Postoperative wound infection--lumbar spine/2015 12/11/2013  . CVA (cerebral infarction) 12/11/2013  . Postoperative anemia due to acute blood loss 04/09/2013  . Diabetes (HCC) 04/07/2013  . Hypertension 04/07/2013  . Unspecified sleep apnea 04/07/2013  . OA (osteoarthritis) of knee 04/06/2013    Past Surgical History:  Procedure Laterality Date  . ABDOMINAL HYSTERECTOMY     rt so  . BACK SURGERY  2010 /  2011  . BREAST REDUCTION SURGERY    . BURR HOLE FOR SUBDURAL HEMATOMA  2015  . cataracts removed    . CHOLECYSTECTOMY  1990  . DILATION AND CURETTAGE OF UTERUS    . HERNIA REPAIR    . POSTERIOR LAMINECTOMY / DECOMPRESSION LUMBAR SPINE     with T10-S1 fusion  . TONSILLECTOMY    . TOTAL KNEE ARTHROPLASTY Left 04/06/2013   Procedure: LEFT TOTAL KNEE ARTHROPLASTY;  Surgeon: Loanne Drilling, MD;  Location: [facility];  Service: Orthopedics;  Laterality: Left;     No current  facility-administered medications for this encounter.   Current Outpatient Medications:  .  acetaminophen (TYLENOL) 500 MG tablet, Take 1,000 mg by mouth every 6 (six) hours as needed for pain., Disp: , Rfl:  .  aspirin EC 81 MG tablet, Take 81 mg by mouth daily., Disp: , Rfl:  .  beta carotene w/minerals (OCUVITE) tablet, Take 1 tablet by mouth 2 (two) times daily., Disp: , Rfl:  .  carvedilol (COREG) 3.125 MG tablet, Take 3.125 mg by mouth daily., Disp: , Rfl:  .  cephALEXin (KEFLEX) 500 MG capsule, Take 1 capsule (500 mg total) by mouth every 8 (eight) hours. (Patient taking differently: Take 1,000 mg by mouth 2 (two) times daily. ), Disp: 90 capsule, Rfl: 1 .  clopidogrel (PLAVIX) 75 MG tablet, Take 75 mg by mouth daily. , Disp: , Rfl:  .  cyclobenzaprine (FLEXERIL) 5 MG tablet, Take 1 tablet (5 mg total) by mouth 3 (three) times daily as needed for muscle spasms., Disp: 21 tablet, Rfl: 0 .  furosemide (LASIX) 40 MG tablet, Take 40 mg by mouth 2 (two) times daily. , Disp: , Rfl:  .  gabapentin (NEURONTIN) 400 MG capsule, Take 400 mg by mouth 2 (two) times daily. , Disp: , Rfl:  .  hydrochlorothiazide (MICROZIDE) 12.5 MG capsule, Take 12.5 mg by mouth daily., Disp: , Rfl:  .  HYDROcodone-acetaminophen (NORCO/VICODIN) 5-325 MG tablet, Take 1-2 tablets by mouth every 6 (six) hours as needed., Disp: 5 tablet, Rfl: 0 .  insulin glargine (LANTUS) 100 UNIT/ML injection, Inject 58 Units into the skin at bedtime., Disp: , Rfl:  .  insulin lispro (HUMALOG) 100 UNIT/ML injection, Inject 18 Units into the skin 3 (three) times daily with meals., Disp: , Rfl:  .  lisinopril (PRINIVIL,ZESTRIL) 5 MG tablet, Take 5 mg by mouth at bedtime. , Disp: , Rfl:  .  methylPREDNISolone (MEDROL DOSEPAK) 4 MG TBPK tablet, Take per package instructions, Disp: 21 tablet, Rfl: 0 .  Multiple Vitamin (MULTIVITAMIN WITH MINERALS) TABS tablet, Take 1 tablet by mouth daily., Disp: , Rfl:  .  omeprazole (PRILOSEC) 20 MG capsule,  Take 20 mg by mouth daily., Disp: , Rfl:  .  PARoxetine (PAXIL) 20 MG tablet, Take 40 mg by mouth at bedtime., Disp: , Rfl:  .  polyethylene glycol (MIRALAX / GLYCOLAX) packet, Take 17 g by mouth daily as needed., Disp: 14 each, Rfl: 0 .  rosuvastatin (CRESTOR) 5 MG tablet, Take 2.5 mg by mouth at bedtime., Disp: , Rfl:  .  sitaGLIPtin (JANUVIA) 50 MG tablet, Take 50 mg by mouth daily., Disp: , Rfl:  .  traMADol (ULTRAM) 50 MG tablet, Take 1 tablet (50 mg total) by mouth every 6 (six) hours as needed for moderate pain (Take 1 and if no relief take 2nd tablet repeat Q6H)., Disp: 30 tablet, Rfl: 0 .  traZODone (DESYREL) 150 MG tablet, Take 150 mg by mouth at  bedtime. , Disp: , Rfl:  .  oxyCODONE-acetaminophen (PERCOCET/ROXICET) 5-325 MG tablet, Take 0.5 tablets by mouth 2 (two) times daily as needed for severe pain. Do not drive while taking as can cause drowsiness., Disp: 4 tablet, Rfl: 0  Allergies Codeine; Dilaudid [hydromorphone]; Demerol [meperidine]; Pravastatin; Lipitor [atorvastatin]; Nifedipine; Statins; Sulfa antibiotics; and Vancomycin  Family History  Problem Relation Age of Onset  . COPD Sister        had lung transplant  . COPD Brother     Social History Social History   Tobacco Use  . Smoking status: Never Smoker  . Smokeless tobacco: Never Used  Substance Use Topics  . Alcohol use: No    Alcohol/week: 0.0 oz  . Drug use: No    Review of Systems Constitutional: No fever/chills Eyes: No visual changes. Cardiovascular: Denies chest pain. Respiratory: Denies shortness of breath. Gastrointestinal: No abdominal pain.  No nausea, no vomiting.   Genitourinary: Negative for dysuria. Musculoskeletal: positive for back pain. Skin: Negative for rash. Neurological: Negative for headaches, focal weakness or numbness.   ____________________________________________   PHYSICAL EXAM:  VITAL SIGNS: ED Triage Vitals  Enc Vitals Group     BP 01/09/18 1715 (!) 148/66      Pulse Rate 01/09/18 1715 80     Resp 01/09/18 1715 18     Temp 01/09/18 1715 97.7 F (36.5 C)     Temp Source 01/09/18 1715 Oral     SpO2 01/09/18 1715 96 %     Weight 01/09/18 1718 260 lb (117.9 kg)     Height --      Head Circumference --      Peak Flow --      Pain Score 01/09/18 1718 10     Pain Loc --      Pain Edu? --      Excl. in GC? --     Constitutional: Alert and oriented. Well appearing and in no acute distress. Eyes: Conjunctivae are normal. PERRL. EOMI. ENT      Head: Normocephalic and atraumatic.      Nose: No congestion/rhinnorhea.      Mouth/Throat: Mucous membranes are moist. Cardiovascular: Normal rate, regular rhythm. Grossly normal heart sounds.  Good peripheral circulation. Respiratory: Normal respiratory effort without tachypnea nor retractions. Breath sounds are clear and equal bilaterally. No wheezes, rales, rhonchi. Gastrointestinal: Soft and nontender. No CVA tenderness. Musculoskeletal:   No midline thoracic tenderness to palpation. Bilateral distal pulses equal and easily palpated. Except: Mild tenderness along midline diffuse cervical spine as well and was tenderness along left trapezius muscle and proximal left shoulder, slight decrease abduction to left shoulder, but good strength, left upper extremity otherwise nontender, no swelling, no ecchymosis noted. Except: Diffuse lower lumbar and paralumbar tenderness to palpation, no swelling, no ecchymosis. Neurologic:  Normal speech and language. No gross focal neurologic deficits are appreciated. Speech is normal. 5/5 strength bilateral upper and lower extremities. No paresthesias. Unsteady gait. Skin:  Skin is warm, dry and intact. No rash noted. Psychiatric: Mood and affect are normal. Speech and behavior are normal. Patient exhibits appropriate insight and judgment   ___________________________________________   LABS (all labs ordered are listed, but only abnormal results are displayed)  Labs  Reviewed - No data to display  RADIOLOGY  Dg Cervical Spine Complete  Result Date: 01/09/2018 CLINICAL DATA:  Pain after fall EXAM: CERVICAL SPINE - COMPLETE 4+ VIEW COMPARISON:  None. FINDINGS: Dens and lateral masses are within normal limits. Inadequate visualization of C7  and cervicothoracic junction. Imaged upper vertebral body heights are normal. Mild degenerative changes at C4-C5 and C5-C6. Prevertebral soft tissue thickness upper normal. Probable bony foraminal narrowing of the mid cervical spine. IMPRESSION: 1. Inadequate visualization of C7 and cervicothoracic junction 2. Imaged portions of the cervical spine demonstrate no definite acute osseous abnormality Electronically Signed   By: Jasmine Pang M.D.   On: 01/09/2018 18:47   Dg Lumbar Spine Complete  Result Date: 01/09/2018 CLINICAL DATA:  Pain after fall EXAM: LUMBAR SPINE - COMPLETE 4+ VIEW COMPARISON:  03/30/2017 FINDINGS: Surgical clips in the right upper quadrant. Prior hernia repair right lower quadrant. Posterior stabilization rods and multiple fixating screws T10 through the sacrum with fixating screws across the bilateral SI joints. Anterior screws at L4-L5 and L5-S1. Chronic compression deformity L2. Similar alignment compared to previous. IMPRESSION: Extensive postsurgical changes T10 through the sacrum without acute osseous abnormality. Chronic compression deformity of L2. Electronically Signed   By: Jasmine Pang M.D.   On: 01/09/2018 18:50   Ct Cervical Spine Wo Contrast  Result Date: 01/09/2018 CLINICAL DATA:  Fall 2 weeks ago.  Neck pain and shoulder pain. EXAM: CT CERVICAL SPINE WITHOUT CONTRAST TECHNIQUE: Multidetector CT imaging of the cervical spine was performed without intravenous contrast. Multiplanar CT image reconstructions were also generated. COMPARISON:  None. FINDINGS: Alignment: No static subluxation. Facets are aligned. Occipital condyles and the lateral masses of C1 and C2 are normally approximated. Skull  base and vertebrae: No acute fracture. Soft tissues and spinal canal: No prevertebral fluid or swelling. No visible canal hematoma. Disc levels: Severe left C3-C4 neural foraminal stenosis due to combination of uncovertebral and facet hypertrophy. Upper chest: No pneumothorax, pulmonary nodule or pleural effusion. Other: Normal visualized paraspinal cervical soft tissues. IMPRESSION: 1. No acute fracture or static subluxation of the cervical spine. 2. Severe left C4 neural foraminal stenosis due to combination of uncovertebral and facet hypertrophy. Electronically Signed   By: Deatra Robinson M.D.   On: 01/09/2018 19:45   Dg Shoulder Left  Result Date: 01/09/2018 CLINICAL DATA:  Fall with shoulder pain EXAM: LEFT SHOULDER - 2+ VIEW COMPARISON:  None. FINDINGS: Left lung apex is clear. Mild AC joint degenerative change. No fracture or dislocation. Moderate glenohumeral degenerative changes. IMPRESSION: 1. No acute osseous abnormality 2. Moderate glenohumeral degenerative changes. Electronically Signed   By: Jasmine Pang M.D.   On: 01/09/2018 18:45     Interface, Rad Results In - 12/30/2017  4:38 PM EDT EXAM: Computed tomography, head or brain without contrast material. DATE: 12/30/2017 2:04 PM ACCESSION: [id number] UN DICTATED: 12/30/2017 2:38 PM [facility]PRETATION LOCATION: [facility]  CLINICAL INDICATION: 77 years old Female with HEAD INJURY-Ataxia    COMPARISON: 04/16/2016  TECHNIQUE: Axial CT images of the head  from skull base to vertex without contrast.   FINDINGS: There are scattered foci within the periventricular and deep white matter.  These are nonspecific but commonly associated with small vessel ischemic changes. There is no midline shift or mass lesion.  There is no evidence of intracranial hemorrhage or acute infarct.  No fractures are evident.  The sinuses are pneumatized. Sequela of bilateral cataract surgery.  IMPRESSION: No acute intracranial abnormality.  Interface, Rad Results  In - 12/30/2017  2:07 PM EDT EXAM: XR ABDOMEN 3 VIEWS INCLUDES 1 VIEW CHEST DATE: 12/30/2017 1:21 PM ACCESSION: [id number] UN DICTATED: 12/30/2017 2:06 PM [facility]PRETATION LOCATION: [facility]  CLINICAL INDICATION: 77 years old Female with ABDOMINAL PAIN - diffuseR10.84-Abdominal pain, generalized    COMPARISON: CT 04/16/2016  TECHNIQUE: Upright chest radiograph and supine and upright views of the abdomen  FINDINGS:  Lungs are clear. Heart size within normal limits. No pneumoperitoneum identified.   Posterior spinal fixation hardware overlying the lower thoracic, lumbar and proximal sacrum with screws transfixing the sacroiliac joints. Hernia mesh repair clips overlie the right lower quadrant. Right upper quadrant cholecystectomy clips.  Nonobstructive bowel gas pattern. Moderate to large stool burden, most advanced in the right and transverse colon.   IMPRESSION: --Nonobstructive bowel gas pattern. --Moderate to large stool burden. --No pneumoperitoneum.  Interface, Rad Results In - 12/30/2017  1:37 PM EDT EXAM: XR HAND 3 OR MORE VIEWS RIGHT DATE: 12/30/2017 1:21 PM ACCESSION: [id number] UN DICTATED: 12/30/2017 1:34 PM [facility]PRETATION LOCATION: [facility]  CLINICAL INDICATION: 77 years old Female with FALL WITH RIGHT HAND INJURY, PAIN RIGHT HAND OVER [id number]-Fall, initial encounter    COMPARISON: None  TECHNIQUE:   PA, oblique and lateral views of the right hand  FINDINGS:  Features of severe osteoarthrosis include exuberant marginal osteophytosis and corticated ossicle formation about the thumb basal joint in association with articular cartilage space narrowing and subchondral sclerosis particularly at the base of the thumb metacarpal. Features of osteoarthrosis are also present at the right second and to lesser degree third MCP joints and at the thumb, index, and long DIP joints and right fifth PIP joint. There is also overgrowth and thinly corticated cyst formation at the  ulnar styloid with preservation of radiocarpal articular cartilage space.  Overall bone density appears normal. No acute fracture nor dislocation is seen.  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 Most prominent changes and marginal osteophyte and ossicle formation is seen about the thumb basal joint.  No acute fracture nor dislocation, right hand.  Imaging obtained via care everywhere from 12/30/2017. ____________________________________________   PROCEDURES Procedures     INITIAL IMPRESSION / ASSESSMENT AND PLAN / ED COURSE  Pertinent labs & imaging results that were available during my care of the patient were reviewed by me and considered in my medical decision making (see chart for details).  Well-appearing patient.  Family at bedside.  Does live with daughter.  Patient with extensive past medical history with multiple comorbidities with fall that occurred approximately 2 weeks ago with continued neck, left shoulder and low back pain.  History of chronic back and neck issues as well as extensive osteoarthritis.  Recent imaging from primary care outpatient reviewed, results above, with negative acute CT head.  No other recent trauma or fall.  Encourage very close primary care follow-up as well as likely needs to continue follow-up with neurology.  At this time will evaluate cervical, lumbar and left shoulder imaging.  Patient and daughter report patient has been able to tolerate Percocet well in the past without any complications, discussed possible use of only half a tablet at a time and discussed monitoring closely due to fall risk.  Images obtained of cervical spine, left shoulder and lumbar spine, results as above per radiologist.  Lumbar spine and left shoulder negative for acute changes, however unable to fully visualize cervical spine on x-ray and CT was obtained.  CT results as above, per radiologist no acute fracture or static subluxation of the  cervical spine, however severe C4 neural femoral stenosis present.  CT from July 2018 reviewed and it was noted to have mild to moderate stenosis.  Discussed this in detail with patient and daughter, unclear if this has occurred from 1 of the patient's multiple previous falls, age-related  changes or from this most recent fall. No vascular compromise noted.  No focal neurological deficit.  Patient most recent fall occurred 2 weeks ago, discuss recommend close follow-up and management.  Recommend PCP follow-up tomorrow as well as information for neuro spine given and encourage close follow-up. Discussed indication, risks and benefits of medications with patient and daughter.    Discussed follow up and return parameters including including proceeding to the emergency room, for no resolution or any worsening concerns. Patient verbalized understanding and agreed to plan.   ____________________________________________   FINAL CLINICAL IMPRESSION(S) / ED DIAGNOSES  Final diagnoses:  Neck pain  Lumbar pain  Acute pain of left shoulder     ED Discharge Orders        Ordered    oxyCODONE-acetaminophen (PERCOCET/ROXICET) 5-325 MG tablet  2 times daily PRN     01/09/18 1917       Note: This dictation was prepared with Dragon dictation along with smaller phrase technology. Any transcriptional errors that result from this process are unintentional.         Renford Dills, NP 01/09/18 2012

## 2018-01-09 NOTE — ED Triage Notes (Signed)
Pt complains of pain on her left side from the back of her head radiating down left shoulder. Said she fell a weak ago and struck her head. Said she did "pull her back" in the process and now the back is "out of wack." Taking tylenol without relief.

## 2018-01-09 NOTE — Discharge Instructions (Addendum)
Take medication as prescribed. Rest.  Use walker.  Follow up with your primary care physician tomorrow. Return to Urgent care as needed.

## 2018-07-21 ENCOUNTER — Ambulatory Visit: Payer: Medicare Other | Admitting: Podiatry

## 2018-07-24 ENCOUNTER — Ambulatory Visit: Payer: Medicare Other | Admitting: Podiatry

## 2019-05-18 ENCOUNTER — Other Ambulatory Visit: Payer: Self-pay | Admitting: Surgical

## 2019-05-18 DIAGNOSIS — R519 Headache, unspecified: Secondary | ICD-10-CM

## 2019-11-13 ENCOUNTER — Emergency Department: Payer: Medicare Other

## 2019-11-13 ENCOUNTER — Emergency Department
Admission: EM | Admit: 2019-11-13 | Discharge: 2019-11-13 | Disposition: A | Discharge status: Home / Self Care | Payer: Medicare Other | Attending: Emergency Medicine | Admitting: Emergency Medicine

## 2019-11-13 ENCOUNTER — Other Ambulatory Visit: Payer: Self-pay

## 2019-11-13 DIAGNOSIS — I10 Essential (primary) hypertension: Secondary | ICD-10-CM | POA: Insufficient documentation

## 2019-11-13 DIAGNOSIS — Z7982 Long term (current) use of aspirin: Secondary | ICD-10-CM | POA: Insufficient documentation

## 2019-11-13 DIAGNOSIS — Z79899 Other long term (current) drug therapy: Secondary | ICD-10-CM | POA: Diagnosis not present

## 2019-11-13 DIAGNOSIS — Z8673 Personal history of transient ischemic attack (TIA), and cerebral infarction without residual deficits: Secondary | ICD-10-CM | POA: Insufficient documentation

## 2019-11-13 DIAGNOSIS — K625 Hemorrhage of anus and rectum: Secondary | ICD-10-CM | POA: Insufficient documentation

## 2019-11-13 DIAGNOSIS — J45909 Unspecified asthma, uncomplicated: Secondary | ICD-10-CM | POA: Diagnosis not present

## 2019-11-13 DIAGNOSIS — R197 Diarrhea, unspecified: Secondary | ICD-10-CM | POA: Diagnosis not present

## 2019-11-13 DIAGNOSIS — E119 Type 2 diabetes mellitus without complications: Secondary | ICD-10-CM | POA: Insufficient documentation

## 2019-11-13 DIAGNOSIS — Z7902 Long term (current) use of antithrombotics/antiplatelets: Secondary | ICD-10-CM | POA: Insufficient documentation

## 2019-11-13 DIAGNOSIS — R1032 Left lower quadrant pain: Secondary | ICD-10-CM | POA: Diagnosis present

## 2019-11-13 LAB — URINALYSIS, COMPLETE (UACMP) WITH MICROSCOPIC
Bacteria, UA: NONE SEEN
Bilirubin Urine: NEGATIVE
Glucose, UA: NEGATIVE mg/dL
Hgb urine dipstick: NEGATIVE
Ketones, ur: NEGATIVE mg/dL
Leukocytes,Ua: NEGATIVE
Nitrite: NEGATIVE
Protein, ur: NEGATIVE mg/dL
Specific Gravity, Urine: 1.038 — ABNORMAL HIGH (ref 1.005–1.030)
Squamous Epithelial / LPF: NONE SEEN (ref 0–5)
pH: 5 (ref 5.0–8.0)

## 2019-11-13 LAB — COMPREHENSIVE METABOLIC PANEL
ALT: 24 U/L (ref 0–44)
AST: 24 U/L (ref 15–41)
Albumin: 3.7 g/dL (ref 3.5–5.0)
Alkaline Phosphatase: 95 U/L (ref 38–126)
Anion gap: 10 (ref 5–15)
BUN: 32 mg/dL — ABNORMAL HIGH (ref 8–23)
CO2: 25 mmol/L (ref 22–32)
Calcium: 9.4 mg/dL (ref 8.9–10.3)
Chloride: 105 mmol/L (ref 98–111)
Creatinine, Ser: 1.14 mg/dL — ABNORMAL HIGH (ref 0.44–1.00)
GFR calc Af Amer: 53 mL/min — ABNORMAL LOW (ref >60)
GFR calc non Af Amer: 46 mL/min — ABNORMAL LOW (ref >60)
Glucose, Bld: 155 mg/dL — ABNORMAL HIGH (ref 70–99)
Potassium: 4.3 mmol/L (ref 3.5–5.1)
Sodium: 140 mmol/L (ref 135–145)
Total Bilirubin: 0.7 mg/dL (ref 0.3–1.2)
Total Protein: 6.7 g/dL (ref 6.5–8.1)

## 2019-11-13 LAB — CBC
HCT: 40.5 % (ref 36.0–46.0)
Hemoglobin: 12.7 g/dL (ref 12.0–15.0)
MCH: 29.3 pg (ref 26.0–34.0)
MCHC: 31.4 g/dL (ref 30.0–36.0)
MCV: 93.5 fL (ref 80.0–100.0)
Platelets: 191 10*3/uL (ref 150–400)
RBC: 4.33 MIL/uL (ref 3.87–5.11)
RDW: 14.9 % (ref 11.5–15.5)
WBC: 10.7 10*3/uL — ABNORMAL HIGH (ref 4.0–10.5)
nRBC: 0 % (ref 0.0–0.2)

## 2019-11-13 LAB — SAMPLE TO BLOOD BANK

## 2019-11-13 LAB — LIPASE, BLOOD: Lipase: 30 U/L (ref 11–51)

## 2019-11-13 MED ORDER — IOHEXOL 300 MG/ML  SOLN
100.0000 mL | Freq: Once | INTRAMUSCULAR | Status: AC | PRN
  Administered 2019-11-13: 19:00:00 100 mL via INTRAVENOUS

## 2019-11-13 MED ORDER — SODIUM CHLORIDE 0.9% FLUSH
3.0000 mL | Freq: Once | INTRAVENOUS | Status: AC
  Administered 2019-11-13: 19:00:00 3 mL via INTRAVENOUS

## 2019-11-13 MED ORDER — ONDANSETRON 4 MG PO TBDP: ORAL_TABLET | ORAL | 0 refills | Status: DC

## 2019-11-13 MED ORDER — SODIUM CHLORIDE 0.9 % IV BOLUS
1000.0000 mL | Freq: Once | INTRAVENOUS | Status: AC
  Administered 2019-11-13: 1000 mL via INTRAVENOUS

## 2019-11-13 MED ORDER — IOHEXOL 9 MG/ML PO SOLN
500.0000 mL | ORAL | Status: AC
  Administered 2019-11-13: 16:00:00 500 mL via ORAL

## 2019-11-13 NOTE — ED Notes (Signed)
Pt reports diarrhea x12 episodes since yesterday at 6 in the evening. Reports blood in stool. Denies N/V.  Pt in NAD at this time.

## 2019-11-13 NOTE — ED Notes (Signed)
This RN attempted IV access unsuccessfully. 

## 2019-11-13 NOTE — ED Notes (Signed)
Pt difficult IV start, multiple RN attempt. IV consult placed

## 2019-11-13 NOTE — ED Triage Notes (Addendum)
Pt comes via [facility] from home with c/o rectal bleeding and diarrhea. Pt state this started on Saturday.  Pt states some nausea. Pt states upper belly pain.  Pt states it has been bright red blood. Pt states she is on blood thinners

## 2019-11-13 NOTE — ED Notes (Signed)
Pt verbalizes understanding of d/c instructions at this time. denies further questions or concerns Daughter cindy updated and will come to pick up

## 2019-11-13 NOTE — ED Notes (Signed)
Assisted pt to bathroom, able to collect urine sample, pt unable to provide stool sample.

## 2019-11-13 NOTE — ED Provider Notes (Signed)
[facility] EMERGENCY DEPARTMENT Provider Note   CSN: [id number] Arrival date & time: 11/13/19  1326     History Chief Complaint  Patient presents with  . Diarrhea  . Rectal Bleeding    Cheyenne Frank is a 79 y.o. female history of diabetes, reflux, previous stroke on Plavix here presenting with diarrhea and bright red blood per rectum.  Patient states that she has been having about 10 episodes of diarrhea for the last 2 days.  She states that it is watery and occasionally it is blood-tinged.  Patient states that she has abdominal cramps and poor appetite but no vomiting.  Denies any fevers.  Patient called her doctor and was sent in for further evaluation.  Patient denies any history of bowel obstruction or recent antibiotic use or history of C diff.    The history is provided by the patient.       Past Medical History:  Diagnosis Date  . Anemia of chronic disease   . Anxiety   . Arthritis   . Asthma    no problems since 1999 (after moving into a new home)  . Chronic airway obstruction (HCC)   . Complication of anesthesia    " feel like I'm dying after I wake up"  . Diabetes mellitus without complication (HCC)   . Diverticulosis   . Dysrhythmia    unknown type   . Fibromyalgia   . Flat back syndrome   . GERD (gastroesophageal reflux disease)   . Hx of transfusion 2011  . Hypertension   . Itching   . Macular degeneration   . Nocturia   . Restless leg syndrome   . Sleep apnea    Dx 1990's unable to wear c-pap -  . Stroke (HCC) 2010   verbal aphasia x 30 min - resolved - no problem since then    Patient Active Problem List   Diagnosis Date Noted  . SDH (subdural hematoma) (HCC) 12/11/2013  . Postoperative wound infection--lumbar spine/2015 12/11/2013  . CVA (cerebral infarction) 12/11/2013  . Postoperative anemia due to acute blood loss 04/09/2013  . Diabetes (HCC) 04/07/2013  . Hypertension 04/07/2013  . Unspecified sleep apnea  04/07/2013  . OA (osteoarthritis) of knee 04/06/2013    Past Surgical History:  Procedure Laterality Date  . ABDOMINAL HYSTERECTOMY     rt so  . BACK SURGERY  2010 / 2011  . BREAST REDUCTION SURGERY    . BURR HOLE FOR SUBDURAL HEMATOMA  2015  . cataracts removed    . CHOLECYSTECTOMY  1990  . DILATION AND CURETTAGE OF UTERUS    . HERNIA REPAIR    . POSTERIOR LAMINECTOMY / DECOMPRESSION LUMBAR SPINE     with T10-S1 fusion  . TONSILLECTOMY    . TOTAL KNEE ARTHROPLASTY Left 04/06/2013   Procedure: LEFT TOTAL KNEE ARTHROPLASTY;  Surgeon: Loanne Drilling, MD;  Location: [facility];  Service: Orthopedics;  Laterality: Left;     OB History   No obstetric history on file.     Family History  Problem Relation Age of Onset  . COPD Sister        had lung transplant  . COPD Brother     Social History   Tobacco Use  . Smoking status: Never Smoker  . Smokeless tobacco: Never Used  Substance Use Topics  . Alcohol use: No    Alcohol/week: 0.0 standard drinks  . Drug use: No    Home Medications Prior to Admission medications  Medication Sig Start Date End Date Taking? Authorizing Provider  acetaminophen (TYLENOL) 500 MG tablet Take 1,000 mg by mouth every 6 (six) hours as needed for pain.    [provider]  aspirin EC 81 MG tablet Take 81 mg by mouth daily.    [provider]  beta carotene w/minerals (OCUVITE) tablet Take 1 tablet by mouth 2 (two) times daily.    [provider]  carvedilol (COREG) 3.125 MG tablet Take 3.125 mg by mouth daily.    [provider]  cephALEXin (KEFLEX) 500 MG capsule Take 1 capsule (500 mg total) by mouth every 8 (eight) hours. Patient taking differently: Take 1,000 mg by mouth 2 (two) times daily.  12/24/13   Love, Evlyn Kanner, PA-C  clopidogrel (PLAVIX) 75 MG tablet Take 75 mg by mouth daily.  04/18/15   [provider]  cyclobenzaprine (FLEXERIL) 5 MG tablet Take 1 tablet (5 mg total) by mouth 3 (three) times  daily as needed for muscle spasms. 01/21/17   Little, Traci M, PA-C  furosemide (LASIX) 40 MG tablet Take 40 mg by mouth 2 (two) times daily.  04/18/15   [provider]  gabapentin (NEURONTIN) 400 MG capsule Take 400 mg by mouth 2 (two) times daily.     [provider]  hydrochlorothiazide (MICROZIDE) 12.5 MG capsule Take 12.5 mg by mouth daily.    [provider]  HYDROcodone-acetaminophen (NORCO/VICODIN) 5-325 MG tablet Take 1-2 tablets by mouth every 6 (six) hours as needed. 12/13/15   Lutricia Feil, PA-C  insulin glargine (LANTUS) 100 UNIT/ML injection Inject 58 Units into the skin at bedtime.    [provider]  insulin lispro (HUMALOG) 100 UNIT/ML injection Inject 18 Units into the skin 3 (three) times daily with meals.    [provider]  lisinopril (PRINIVIL,ZESTRIL) 5 MG tablet Take 5 mg by mouth at bedtime.  04/18/15   [provider]  methylPREDNISolone (MEDROL DOSEPAK) 4 MG TBPK tablet Take per package instructions 12/13/15   Lutricia Feil, PA-C  Multiple Vitamin (MULTIVITAMIN WITH MINERALS) TABS tablet Take 1 tablet by mouth daily. 12/24/13   Love, Evlyn Kanner, PA-C  omeprazole (PRILOSEC) 20 MG capsule Take 20 mg by mouth daily.    [provider]  oxyCODONE-acetaminophen (PERCOCET/ROXICET) 5-325 MG tablet Take 0.5 tablets by mouth 2 (two) times daily as needed for severe pain. Do not drive while taking as can cause drowsiness. 01/09/18   Renford Dills, NP  PARoxetine (PAXIL) 20 MG tablet Take 40 mg by mouth at bedtime.    [provider]  polyethylene glycol (MIRALAX / GLYCOLAX) packet Take 17 g by mouth daily as needed. 04/09/13   Perkins, Alexzandrew L, PA-C  rosuvastatin (CRESTOR) 5 MG tablet Take 2.5 mg by mouth at bedtime.    [provider]  sitaGLIPtin (JANUVIA) 50 MG tablet Take 50 mg by mouth daily.    [provider]  traZODone (DESYREL) 150 MG tablet Take 150 mg by mouth at bedtime.  03/27/15    [provider]    Allergies    Codeine, Dilaudid [hydromorphone], Demerol [meperidine], Pravastatin, Lipitor [atorvastatin], Nifedipine, Statins, Sulfa antibiotics, and Vancomycin  Review of Systems   Review of Systems  Gastrointestinal: Positive for diarrhea and hematochezia.  All other systems reviewed and are negative.   Physical Exam Updated Vital Signs BP (!) 152/75   Pulse 85   Temp 99.3 F (37.4 C) (Oral)   Resp 18   Ht 5' 2.5" (1.588  m)   Wt 113.4 kg   SpO2 92%   BMI 45.00 kg/m   Physical Exam Vitals and nursing note reviewed.  HENT:     Head: Normocephalic.     Nose: Nose normal.     Mouth/Throat:     Mouth: Mucous membranes are dry.     Comments: MM slightly dry  Eyes:     Extraocular Movements: Extraocular movements intact.     Pupils: Pupils are equal, round, and reactive to light.  Cardiovascular:     Rate and Rhythm: Normal rate and regular rhythm.     Pulses: Normal pulses.     Heart sounds: Normal heart sounds.  Pulmonary:     Effort: Pulmonary effort is normal.     Breath sounds: Normal breath sounds.  Abdominal:     General: Abdomen is flat.     Palpations: Abdomen is soft.     Comments: Mild diffuse tenderness, worse in LLQ   Genitourinary:    Comments: Rectal- brown stool, mildly guiac positive  Musculoskeletal:        General: Normal range of motion.     Cervical back: Normal range of motion.  Skin:    General: Skin is warm.     Capillary Refill: Capillary refill takes less than 2 seconds.  Neurological:     General: No focal deficit present.     Mental Status: She is alert.  Psychiatric:        Mood and Affect: Mood normal.        Behavior: Behavior normal.     ED Results / Procedures / Treatments   Labs (all labs ordered are listed, but only abnormal results are displayed) Labs Reviewed  COMPREHENSIVE METABOLIC PANEL - Abnormal; Notable for the following components:      Result Value   Glucose, Bld 155 (*)     BUN 32 (*)    Creatinine, Ser 1.14 (*)    GFR calc non Af Amer 46 (*)    GFR calc Af Amer 53 (*)    All other components within normal limits  CBC - Abnormal; Notable for the following components:   WBC 10.7 (*)    All other components within normal limits  LIPASE, BLOOD  URINALYSIS, COMPLETE (UACMP) WITH MICROSCOPIC  SAMPLE TO BLOOD BANK    EKG EKG Interpretation  Date/Time:  Friday November 13 2019 14:05:16 EST Ventricular Rate:  82 PR Interval:  192 QRS Duration: 78 QT Interval:  352 QTC Calculation: 411 R Axis:   -8 Text Interpretation: Normal sinus rhythm Low voltage QRS Septal infarct (cited on or before 12-May-2015) Abnormal ECG When compared with ECG of 12-May-2015 13:58, Premature atrial complexes are no longer Present Confirmed by UNCONFIRMED, DOCTOR ([id number]), editor Fredric Mare, Tammy [phone]) on 11/13/2019 2:29:23 PM   Radiology No results found.  Procedures Procedures (including critical care time)  Medications Ordered in ED Medications  sodium chloride flush (NS) 0.9 % injection 3 mL (has no administration in time range)  iohexol (OMNIPAQUE) 9 MG/ML oral solution 500 mL (500 mLs Oral Contrast Given 11/13/19 1540)  sodium chloride 0.9 % bolus 1,000 mL (has no administration in time range)    ED Course  I have reviewed the triage vital signs and the nursing notes.  Pertinent labs & imaging results that were available during my care of the patient were reviewed by me and considered in my medical decision making (see chart for details).    MDM Rules/Calculators/A&P  Cheyenne Frank is a 79 y.o. female here with abdominal pain, diarrhea, blood tinged stool. I think likely gastroenteritis vs colitis vs C diff. She is on plavix and she is hemodynamically stable. Will get labs, CT ab/pel, and hydrate and reassess.   8:39 PM Labs show stable hemoglobin at 12.7.  Her CT showed no colitis or diverticulitis.  I ordered C. difficile and GI pathogen panel  but patient unable to give me a sample.  Given that there is no obvious colitis to suggest bacterial infection, will hold off antibiotics.  Told her to stay hydrated and take Zofran as needed for nausea.  Final Clinical Impression(s) / ED Diagnoses Final diagnoses:  None    Rx / DC Orders ED Discharge Orders    None       Drenda Freeze, MD 11/13/19 2040

## 2019-11-13 NOTE — Discharge Instructions (Addendum)
Stay hydrated   Take zofran for nausea   Eat bland diet high in carbs to help you with diarrhea   See your doctor this week   Return to ER if you have uncontrolled diarrhea, vomiting, fever, uncontrolled bleeding

## 2019-11-13 NOTE — ED Notes (Signed)
Pt to CT

## 2020-04-06 ENCOUNTER — Emergency Department
Admission: EM | Admit: 2020-04-06 | Discharge: 2020-04-06 | Disposition: A | Discharge status: Home / Self Care | Payer: Medicare Other | Attending: Emergency Medicine | Admitting: Emergency Medicine

## 2020-04-06 ENCOUNTER — Other Ambulatory Visit: Payer: Self-pay

## 2020-04-06 ENCOUNTER — Emergency Department: Payer: Medicare Other

## 2020-04-06 ENCOUNTER — Encounter: Payer: Self-pay | Admitting: *Deleted

## 2020-04-06 DIAGNOSIS — S300XXA Contusion of lower back and pelvis, initial encounter: Secondary | ICD-10-CM | POA: Diagnosis not present

## 2020-04-06 DIAGNOSIS — E119 Type 2 diabetes mellitus without complications: Secondary | ICD-10-CM | POA: Diagnosis not present

## 2020-04-06 DIAGNOSIS — K5900 Constipation, unspecified: Secondary | ICD-10-CM | POA: Diagnosis not present

## 2020-04-06 DIAGNOSIS — W010XXA Fall on same level from slipping, tripping and stumbling without subsequent striking against object, initial encounter: Secondary | ICD-10-CM | POA: Insufficient documentation

## 2020-04-06 DIAGNOSIS — J45909 Unspecified asthma, uncomplicated: Secondary | ICD-10-CM | POA: Diagnosis not present

## 2020-04-06 DIAGNOSIS — S3992XA Unspecified injury of lower back, initial encounter: Secondary | ICD-10-CM | POA: Diagnosis present

## 2020-04-06 DIAGNOSIS — Y92009 Unspecified place in unspecified non-institutional (private) residence as the place of occurrence of the external cause: Secondary | ICD-10-CM

## 2020-04-06 DIAGNOSIS — Y9389 Activity, other specified: Secondary | ICD-10-CM | POA: Insufficient documentation

## 2020-04-06 DIAGNOSIS — I10 Essential (primary) hypertension: Secondary | ICD-10-CM | POA: Insufficient documentation

## 2020-04-06 DIAGNOSIS — Z79899 Other long term (current) drug therapy: Secondary | ICD-10-CM | POA: Diagnosis not present

## 2020-04-06 DIAGNOSIS — Y92002 Bathroom of unspecified non-institutional (private) residence single-family (private) house as the place of occurrence of the external cause: Secondary | ICD-10-CM | POA: Insufficient documentation

## 2020-04-06 DIAGNOSIS — Y999 Unspecified external cause status: Secondary | ICD-10-CM | POA: Diagnosis not present

## 2020-04-06 DIAGNOSIS — R55 Syncope and collapse: Secondary | ICD-10-CM | POA: Diagnosis not present

## 2020-04-06 DIAGNOSIS — T148XXA Other injury of unspecified body region, initial encounter: Secondary | ICD-10-CM

## 2020-04-06 LAB — CBC
HCT: 40.5 % (ref 36.0–46.0)
Hemoglobin: 12.8 g/dL (ref 12.0–15.0)
MCH: 29.5 pg (ref 26.0–34.0)
MCHC: 31.6 g/dL (ref 30.0–36.0)
MCV: 93.3 fL (ref 80.0–100.0)
Platelets: 156 10*3/uL (ref 150–400)
RBC: 4.34 MIL/uL (ref 3.87–5.11)
RDW: 15 % (ref 11.5–15.5)
WBC: 6.9 10*3/uL (ref 4.0–10.5)
nRBC: 0 % (ref 0.0–0.2)

## 2020-04-06 LAB — BASIC METABOLIC PANEL
Anion gap: 8 (ref 5–15)
BUN: 25 mg/dL — ABNORMAL HIGH (ref 8–23)
CO2: 33 mmol/L — ABNORMAL HIGH (ref 22–32)
Calcium: 9.1 mg/dL (ref 8.9–10.3)
Chloride: 101 mmol/L (ref 98–111)
Creatinine, Ser: 1.2 mg/dL — ABNORMAL HIGH (ref 0.44–1.00)
GFR calc Af Amer: 50 mL/min — ABNORMAL LOW (ref >60)
GFR calc non Af Amer: 43 mL/min — ABNORMAL LOW (ref >60)
Glucose, Bld: 161 mg/dL — ABNORMAL HIGH (ref 70–99)
Potassium: 4.4 mmol/L (ref 3.5–5.1)
Sodium: 142 mmol/L (ref 135–145)

## 2020-04-06 LAB — TROPONIN I (HIGH SENSITIVITY): Troponin I (High Sensitivity): 9 ng/L (ref <18)

## 2020-04-06 NOTE — ED Triage Notes (Signed)
Pt to ED after standing this morning to go to the bathroom and feeling dizziness before falling. Pt hit her head and reports she takes blood thinners and currently has a headache. No other acute neurologic symptoms.   Pt also verbalized left hip pain since falling.

## 2020-04-06 NOTE — ED Notes (Signed)
Patient assisted to the bathroom to void. x1

## 2020-04-06 NOTE — ED Notes (Signed)
Patient transported to X-ray 

## 2020-04-06 NOTE — Discharge Instructions (Signed)
You were seen in the ED because of a fall at home.  We did blood work that showed no new problems, CAT scan of your head that showed no bleeding around the brain and x-ray of your left hip that did not show any broken bones.   At home, please increase the amount of MiraLAX that you are using, 3 capfuls per day to assist with passing stool. Use your nystatin powder daily for the next 5-7 days to help clear up the superficial yeast/fungal infection of your belly.  If you develop any fevers, strokelike symptoms or additional concerns, please return to the ED.

## 2020-04-06 NOTE — ED Provider Notes (Signed)
[facility] Emergency Department Provider Note ____________________________________________   First MD Initiated Contact with Patient 04/06/20 [id number]     (approximate)  I have reviewed the triage vital signs and the nursing notes.  HISTORY  Chief Complaint Dizziness and Fall   HPI Cheyenne Frank is a 79 y.o. female presents to the ED for evaluation after a fall.   History of DM on oral agents and insulin, previous CVA on Eliquis and aspirin, HTN, HLD, GERD and obesity.  Patient lives at home with her daughters and grandchild, ambulatory with a walker.  Patient reports being seated on the toilet to void, then having a syncopal episode after standing from the toilet.  She reports some preceding dizziness before falling and hitting her occiput and left buttocks on the floor.  She reports she "does not know what happened" but denies "passing out all the way."  Denies incontinence, tongue biting or seizure-like activity.  No postictal period.  Further history obtained from patient's daughters over the phone, as documented below in the timestamp section, and indicates that they have no acute concerns for patient's health.  They wish that she would perform physical therapy, but patient refuses to do this because she does not want to leave the house to go to a PT clinic.  Also resistant to home health PT.  Patient denies any recent illnesses, including fevers, chest pain, shortness of breath, increased productive cough.    Past Medical History:  Diagnosis Date  . Anemia of chronic disease   . Anxiety   . Arthritis   . Asthma    no problems since 1999 (after moving into a new home)  . Chronic airway obstruction (HCC)   . Complication of anesthesia    " feel like I'm dying after I wake up"  . Diabetes mellitus without complication (HCC)   . Diverticulosis   . Dysrhythmia    unknown type   . Fibromyalgia   . Flat back syndrome   . GERD (gastroesophageal reflux  disease)   . Hx of transfusion 2011  . Hypertension   . Itching   . Macular degeneration   . Nocturia   . Restless leg syndrome   . Sleep apnea    Dx 1990's unable to wear c-pap -  . Stroke (HCC) 2010   verbal aphasia x 30 min - resolved - no problem since then    Patient Active Problem List   Diagnosis Date Noted  . SDH (subdural hematoma) (HCC) 12/11/2013  . Postoperative wound infection--lumbar spine/2015 12/11/2013  . CVA (cerebral infarction) 12/11/2013  . Postoperative anemia due to acute blood loss 04/09/2013  . Diabetes (HCC) 04/07/2013  . Hypertension 04/07/2013  . Unspecified sleep apnea 04/07/2013  . OA (osteoarthritis) of knee 04/06/2013    Past Surgical History:  Procedure Laterality Date  . ABDOMINAL HYSTERECTOMY     rt so  . BACK SURGERY  2010 / 2011  . BREAST REDUCTION SURGERY    . BURR HOLE FOR SUBDURAL HEMATOMA  2015  . cataracts removed    . CHOLECYSTECTOMY  1990  . DILATION AND CURETTAGE OF UTERUS    . HERNIA REPAIR    . POSTERIOR LAMINECTOMY / DECOMPRESSION LUMBAR SPINE     with T10-S1 fusion  . TONSILLECTOMY    . TOTAL KNEE ARTHROPLASTY Left 04/06/2013   Procedure: LEFT TOTAL KNEE ARTHROPLASTY;  Surgeon: Loanne DrillingFrank V Aluisio, MD;  Location: [facility];  Service: Orthopedics;  Laterality: Left;    Prior  to Admission medications   Medication Sig Start Date End Date Taking? Authorizing Provider  acetaminophen (TYLENOL) 500 MG tablet Take 500-1,000 mg by mouth every 6 (six) hours as needed for mild pain or fever.     [provider]  aspirin EC 81 MG tablet Take 81 mg by mouth daily.    [provider]  carvedilol (COREG) 25 MG tablet Take 25 mg by mouth 2 (two) times daily.     [provider]  ELIQUIS 5 MG TABS tablet Take 5 mg by mouth 2 (two) times daily. 10/27/19   [provider]  furosemide (LASIX) 20 MG tablet Take 20 mg by mouth daily.     [provider]  gabapentin (NEURONTIN) 300 MG capsule Take 300 mg by  mouth 2 (two) times daily.     [provider]  insulin glargine (LANTUS) 100 UNIT/ML injection Inject 64 Units into the skin at bedtime.     [provider]  insulin lispro (HUMALOG) 100 UNIT/ML injection Inject 22-30 Units into the skin 3 (three) times daily with meals.     [provider]  losartan (COZAAR) 50 MG tablet Take 50 mg by mouth daily. 09/27/19   [provider]  omeprazole (PRILOSEC) 40 MG capsule Take 40 mg by mouth daily.     [provider]  ondansetron (ZOFRAN ODT) 4 MG disintegrating tablet 4mg  ODT q4 hours prn nausea/vomit 11/13/19   11/15/19, MD  PARoxetine (PAXIL) 40 MG tablet Take 40 mg by mouth at bedtime.     [provider]  rosuvastatin (CRESTOR) 5 MG tablet Take 2.5 mg by mouth at bedtime.    [provider]  sitaGLIPtin (JANUVIA) 50 MG tablet Take 50 mg by mouth daily.    [provider]  traZODone (DESYREL) 50 MG tablet Take 50 mg by mouth at bedtime.     [provider]    Allergies Codeine, Dilaudid [hydromorphone], Demerol [meperidine], Pravastatin, Lipitor [atorvastatin], Nifedipine, Statins, Sulfa antibiotics, and Vancomycin  Family History  Problem Relation Age of Onset  . COPD Sister        had lung transplant  . COPD Brother     Social History Social History   Tobacco Use  . Smoking status: Never Smoker  . Smokeless tobacco: Never Used  Substance Use Topics  . Alcohol use: No    Alcohol/week: 0.0 standard drinks  . Drug use: No    Review of Systems  Constitutional: No fever/chills Eyes: No visual changes. ENT: No sore throat. Cardiovascular: Denies chest pain. Respiratory: Denies shortness of breath. Gastrointestinal: No abdominal pain.  No nausea, no vomiting.  No diarrhea.  No constipation. Genitourinary: Negative for dysuria. Musculoskeletal: Negative for back pain.  Positive for fall and buttock pain. Skin: Negative for rash. Neurological:  Negative for headaches, focal weakness or numbness.  Positive for possible syncope.   ____________________________________________   PHYSICAL EXAM:  VITAL SIGNS: ED Triage Vitals  Enc Vitals Group     BP 04/06/20 0625 (!) 173/69     Pulse Rate 04/06/20 0625 68     Resp 04/06/20 0625 16     Temp 04/06/20 0625 98.1 F (36.7 C)     Temp Source 04/06/20 0625 Oral     SpO2 04/06/20 0625 95 %     Weight 04/06/20 0626 260 lb (117.9 kg)     Height 04/06/20 0626 5' 2.5" (1.588 m)     Head Circumference --  Peak Flow --      Pain Score 04/06/20 0626 8     Pain Loc --      Pain Edu? --      Excl. in GC? --     Constitutional: Alert and oriented. Well appearing and in no acute distress.  Obese.  Sitting up in bed and conversational in full sentences. Eyes: Conjunctivae are normal. PERRL. EOMI. Head: Atraumatic. Nose: No congestion/rhinnorhea. Mouth/Throat: Mucous membranes are moist.  Oropharynx non-erythematous. Neck: No stridor. No cervical spine tenderness to palpation. Cardiovascular: Normal rate, regular rhythm. Grossly normal heart sounds.  Good peripheral circulation. Respiratory: Minimal tachypnea to the lower 20s..  No retractions.  Good air movement throughout, but faint end expiratory wheezes are present.  These resolve after patient uses her home albuterol inhaler, 2 puffs, in front of me.. Gastrointestinal: Soft , nondistended, nontender to palpation. No abdominal bruits. No CVA tenderness.  Obese with irritated pannus with erythema indicating mild fungal infection. Musculoskeletal: No lower extremity tenderness .  No joint effusions.  Left buttocks hematoma close to the gluteal cleft without discrete laceration or active bleeding.  This is tender to palpation without fluctuance or warmth. Neurologic:  Normal speech and language. No gross focal neurologic deficits are appreciated. No gait instability noted. Skin:  Skin is warm, dry and intact. No rash noted. Psychiatric:  Mood and affect are normal. Speech and behavior are normal.  ____________________________________________   LABS (all labs ordered are listed, but only abnormal results are displayed)  Labs Reviewed  BASIC METABOLIC PANEL - Abnormal; Notable for the following components:      Result Value   CO2 33 (*)    Glucose, Bld 161 (*)    BUN 25 (*)    Creatinine, Ser 1.20 (*)    GFR calc non Af Amer 43 (*)    GFR calc Af Amer 50 (*)    All other components within normal limits  CBC  TROPONIN I (HIGH SENSITIVITY)   ____________________________________________  12 Lead EKG 12-lead EKG with sinus rhythm, rate of 71 bpm and normal axis.  Intervals WNL no evidence of acute ischemia.  ____________________________________________  RADIOLOGY  ED MD interpretation: CT head ordered to assess for ICH in the setting of fall and syncope reviewed without evidence of acute intracranial pathology.  X-ray pelvis and left hip obtained to concern for fracture due to fall and pain, without evidence of acute fracture or dislocation.  Official radiology report(s): CT HEAD WO CONTRAST  Result Date: 04/06/2020 CLINICAL DATA:  Head trauma.  Fall.  On blood thinners.  Headache. EXAM: CT HEAD WITHOUT CONTRAST TECHNIQUE: Contiguous axial images were obtained from the base of the skull through the vertex without intravenous contrast. COMPARISON:  03/30/2017 FINDINGS: Brain: There is no evidence of acute infarct, intracranial hemorrhage, mass, midline shift, or extra-axial fluid collection. The ventricles and sulci are normal. A chronic lacunar infarct at the ventral aspect of the left thalamus is unchanged. Vascular: Calcified atherosclerosis at the skull base. No hyperdense vessel. Skull: No acute fracture or suspicious osseous lesion. Left frontal burr hole. Sinuses/Orbits: Partially visualized right maxillary sinus mucosal thickening. Clear mastoid air cells. Bilateral cataract extraction. Other: None. IMPRESSION:  1. No evidence of acute intracranial abnormality. 2. Chronic left thalamic lacunar infarct. Electronically Signed   By: Sebastian Ache M.D.   On: 04/06/2020 07:37   DG Hip Unilat W or Wo Pelvis 2-3 Views Left  Result Date: 04/06/2020 CLINICAL DATA:  Left hip pain after fall today.  EXAM: DG HIP (WITH OR WITHOUT PELVIS) 2-3V LEFT COMPARISON:  None. FINDINGS: There is no evidence of hip fracture or dislocation. There is no evidence of arthropathy or other focal bone abnormality. IMPRESSION: Negative. Electronically Signed   By: Lupita Raider M.D.   On: 04/06/2020 08:52    ____________________________________________   PROCEDURES  Procedure(s) performed (including Critical Care):  Procedures   ____________________________________________   INITIAL IMPRESSION / ASSESSMENT AND PLAN / ED COURSE  79 year old man presents for evaluation after fall in the bathroom after using the toilet, without evidence of significant acute pathology and amenable to continued outpatient management.  CT head obtained due to concern for possible ICH in the setting of her trauma and anticoagulation use, and without evidence of such.  Further complaining of left hip pain, and without evidence of fracture or dislocation.  This is likely pain due to a moderate sized hematoma on her left buttocks is a likely source of her more significant pain.  I offered the patient physical therapy referral, but she refuses this indicating that she prefers to stay home.  Daughters corroborate this indicating that this has been offered her before and she refuses every time.  Daughters are happy for her to come back home I have no additional concerns, and I see no evidence of further acute pathology.  Patient is stable for discharge home.  Return precautions were discussed prior to her leaving, as well as with her daughters.  Clinical Course as of Apr 07 1611  Wed Apr 06, 2020  0823 Basic metabolic panel(!) [DS]  [phone] CKD at baseline  noted.   [DS]  [phone] Spoke with Aris Everts, daughters from home, and explained workup without acute features.  We discussed daughter's comfort level with the patient home and if they have any additional concerns.  They report no additional concerns and they are comfortable having her home and they have no remaining concerns about her acute health needs.  They report that she has been weaker and wished that she was stronger, but also report that patient has denied physical therapy in the past and refusing this help.  Daughter agrees to come pick the patient up for discharge home.   [DS]    Clinical Course User Index [DS] Delton Prairie, MD     ____________________________________________   FINAL CLINICAL IMPRESSION(S) / ED DIAGNOSES  Final diagnoses:  Fall in home, initial encounter  Bruised  Constipation, unspecified constipation type     ED Discharge Orders    None       Lazlo Tunney Katrinka Blazing   Note:  This document was prepared using Dragon voice recognition software and may include unintentional dictation errors.   Delton Prairie, MD 04/06/20 [phone]

## 2020-07-10 ENCOUNTER — Emergency Department
Admission: EM | Admit: 2020-07-10 | Discharge: 2020-07-11 | Disposition: A | Discharge status: Home / Self Care | Payer: Medicare Other | Attending: Emergency Medicine | Admitting: Emergency Medicine

## 2020-07-10 ENCOUNTER — Emergency Department: Payer: Medicare Other

## 2020-07-10 ENCOUNTER — Other Ambulatory Visit: Payer: Self-pay

## 2020-07-10 DIAGNOSIS — R109 Unspecified abdominal pain: Secondary | ICD-10-CM | POA: Insufficient documentation

## 2020-07-10 DIAGNOSIS — W182XXA Fall in (into) shower or empty bathtub, initial encounter: Secondary | ICD-10-CM | POA: Diagnosis not present

## 2020-07-10 DIAGNOSIS — J45909 Unspecified asthma, uncomplicated: Secondary | ICD-10-CM | POA: Insufficient documentation

## 2020-07-10 DIAGNOSIS — Z96652 Presence of left artificial knee joint: Secondary | ICD-10-CM | POA: Insufficient documentation

## 2020-07-10 DIAGNOSIS — Y92002 Bathroom of unspecified non-institutional (private) residence single-family (private) house as the place of occurrence of the external cause: Secondary | ICD-10-CM | POA: Diagnosis not present

## 2020-07-10 DIAGNOSIS — Z794 Long term (current) use of insulin: Secondary | ICD-10-CM | POA: Diagnosis not present

## 2020-07-10 DIAGNOSIS — I10 Essential (primary) hypertension: Secondary | ICD-10-CM | POA: Diagnosis not present

## 2020-07-10 DIAGNOSIS — S301XXA Contusion of abdominal wall, initial encounter: Secondary | ICD-10-CM | POA: Diagnosis not present

## 2020-07-10 DIAGNOSIS — W19XXXA Unspecified fall, initial encounter: Secondary | ICD-10-CM

## 2020-07-10 DIAGNOSIS — E119 Type 2 diabetes mellitus without complications: Secondary | ICD-10-CM | POA: Insufficient documentation

## 2020-07-10 DIAGNOSIS — Z7982 Long term (current) use of aspirin: Secondary | ICD-10-CM | POA: Diagnosis not present

## 2020-07-10 DIAGNOSIS — Y93E1 Activity, personal bathing and showering: Secondary | ICD-10-CM | POA: Insufficient documentation

## 2020-07-10 DIAGNOSIS — S3991XA Unspecified injury of abdomen, initial encounter: Secondary | ICD-10-CM | POA: Diagnosis present

## 2020-07-10 DIAGNOSIS — Z7901 Long term (current) use of anticoagulants: Secondary | ICD-10-CM | POA: Insufficient documentation

## 2020-07-10 DIAGNOSIS — Z79899 Other long term (current) drug therapy: Secondary | ICD-10-CM | POA: Insufficient documentation

## 2020-07-10 LAB — CREATININE, SERUM
Creatinine, Ser: 1.59 mg/dL — ABNORMAL HIGH (ref 0.44–1.00)
GFR, Estimated: 33 mL/min — ABNORMAL LOW (ref >60)

## 2020-07-10 MED ORDER — FENTANYL CITRATE (PF) 100 MCG/2ML IJ SOLN
50.0000 ug | Freq: Once | INTRAMUSCULAR | Status: AC
  Administered 2020-07-11: 50 ug via INTRAVENOUS
  Filled 2020-07-10: qty 2

## 2020-07-10 MED ORDER — ACETAMINOPHEN 500 MG PO TABS
1000.0000 mg | ORAL_TABLET | Freq: Once | ORAL | Status: AC
  Administered 2020-07-11: 1000 mg via ORAL
  Filled 2020-07-10: qty 2

## 2020-07-10 NOTE — ED Triage Notes (Signed)
PT to ED via EMS from home. PT was attempting to stand using a stand assist object mounted to the wall and the oject broke causing the pt to fall. PT complains of R side pain going down into low back. PT able to stand with assistance at this time. PT states she did not hit or head or lose consciousness but she does take eliquis. PT alert and oriented at this time.

## 2020-07-11 ENCOUNTER — Emergency Department: Payer: Medicare Other

## 2020-07-11 ENCOUNTER — Encounter: Payer: Self-pay | Admitting: Radiology

## 2020-07-11 DIAGNOSIS — S301XXA Contusion of abdominal wall, initial encounter: Secondary | ICD-10-CM | POA: Diagnosis not present

## 2020-07-11 MED ORDER — OXYCODONE HCL 5 MG PO TABS
5.0000 mg | ORAL_TABLET | Freq: Once | ORAL | Status: AC
  Administered 2020-07-11: 5 mg via ORAL
  Filled 2020-07-11: qty 1

## 2020-07-11 MED ORDER — LIDOCAINE 5 % EX PTCH
1.0000 | MEDICATED_PATCH | CUTANEOUS | Status: DC
  Administered 2020-07-11: 1 via TRANSDERMAL
  Filled 2020-07-11: qty 1

## 2020-07-11 MED ORDER — OXYCODONE HCL 5 MG PO TABS
5.0000 mg | ORAL_TABLET | Freq: Three times a day (TID) | ORAL | 0 refills | Status: DC | PRN
Start: 2020-07-11 — End: 2020-12-27

## 2020-07-11 MED ORDER — IOHEXOL 300 MG/ML  SOLN
100.0000 mL | Freq: Once | INTRAMUSCULAR | Status: AC | PRN
  Administered 2020-07-11: 100 mL via INTRAVENOUS

## 2020-07-11 MED ORDER — ACETAMINOPHEN 500 MG PO TABS: 1000.0000 mg | ORAL_TABLET | Freq: Three times a day (TID) | ORAL | 0 refills | Status: DC | PRN

## 2020-07-11 MED ORDER — LIDOCAINE 5 % EX PTCH: 1.0000 | MEDICATED_PATCH | Freq: Two times a day (BID) | CUTANEOUS | 0 refills | Status: DC

## 2020-07-11 NOTE — ED Notes (Signed)
Patient transported to CT 

## 2020-07-11 NOTE — Discharge Instructions (Signed)
Pain control: Take tylenol 1000mg  every 8 hours. Take 5mg  of oxycodone every 6 hours for breakthrough pain. If you need the oxycodone make sure to take one senokot as well to prevent constipation. Change lidoderm patch every 12 hours.  Do not drink alcohol, drive or participate in any other potentially dangerous activities while taking this medication as it may make you sleepy. Do not take this medication with any other sedating medications, either prescription or over-the-counter.

## 2020-07-11 NOTE — ED Provider Notes (Signed)
[facility] Emergency Department Provider Note  ____________________________________________  Time seen: Approximately 12:09 AM  I have reviewed the triage vital signs and the nursing notes.   HISTORY  Chief Complaint Fall   HPI Cheyenne Frank is a 79 y.o. female with several chronic comorbidities as listed below including paroxysmal A. fib on Eliquis who presents after mechanical fall.  Patient reports that she was showering by herself  and using the stand assist object that was mounted to the wall of the shower when the object broke and caused patient to fall onto her back.  She is complaining of right flank pain.  She denies head trauma or LOC.  She denies neck pain or midline back pain, extremity pain, chest pain, or abdominal pain.  Patient does report having a little bit difficulty breathing since the fall due to the severity of the pain.  She is complaining of severe sharp right flank pain.  No history of COPD or asthma.  Past Medical History:  Diagnosis Date  . Anemia of chronic disease   . Anxiety   . Arthritis   . Asthma    no problems since 1999 (after moving into a new home)  . Chronic airway obstruction (HCC)   . Complication of anesthesia    " feel like I'm dying after I wake up"  . Diabetes mellitus without complication (HCC)   . Diverticulosis   . Dysrhythmia    unknown type   . Fibromyalgia   . Flat back syndrome   . GERD (gastroesophageal reflux disease)   . Hx of transfusion 2011  . Hypertension   . Itching   . Macular degeneration   . Nocturia   . Restless leg syndrome   . Sleep apnea    Dx 1990's unable to wear c-pap -  . Stroke (HCC) 2010   verbal aphasia x 30 min - resolved - no problem since then    Patient Active Problem List   Diagnosis Date Noted  . SDH (subdural hematoma) (HCC) 12/11/2013  . Postoperative wound infection--lumbar spine/2015 12/11/2013  . CVA (cerebral infarction) 12/11/2013  . Postoperative  anemia due to acute blood loss 04/09/2013  . Diabetes (HCC) 04/07/2013  . Hypertension 04/07/2013  . Unspecified sleep apnea 04/07/2013  . OA (osteoarthritis) of knee 04/06/2013    Past Surgical History:  Procedure Laterality Date  . ABDOMINAL HYSTERECTOMY     rt so  . BACK SURGERY  2010 / 2011  . BREAST REDUCTION SURGERY    . BURR HOLE FOR SUBDURAL HEMATOMA  2015  . cataracts removed    . CHOLECYSTECTOMY  1990  . DILATION AND CURETTAGE OF UTERUS    . HERNIA REPAIR    . POSTERIOR LAMINECTOMY / DECOMPRESSION LUMBAR SPINE     with T10-S1 fusion  . TONSILLECTOMY    . TOTAL KNEE ARTHROPLASTY Left 04/06/2013   Procedure: LEFT TOTAL KNEE ARTHROPLASTY;  Surgeon: Loanne Drilling, MD;  Location: [facility] ORS;  Service: Orthopedics;  Laterality: Left;    Prior to Admission medications   Medication Sig Start Date End Date Taking? Authorizing Provider  acetaminophen (TYLENOL) 500 MG tablet Take 500-1,000 mg by mouth every 6 (six) hours as needed for mild pain or fever.     [provider]  acetaminophen (TYLENOL) 500 MG tablet Take 2 tablets (1,000 mg total) by mouth every 8 (eight) hours as needed for mild pain or moderate pain. 07/11/20 07/11/21  Nita Sickle, MD  aspirin EC 81  MG tablet Take 81 mg by mouth daily.    [provider]  carvedilol (COREG) 25 MG tablet Take 25 mg by mouth 2 (two) times daily.     [provider]  ELIQUIS 5 MG TABS tablet Take 5 mg by mouth 2 (two) times daily. 10/27/19   [provider]  furosemide (LASIX) 20 MG tablet Take 20 mg by mouth daily.     [provider]  gabapentin (NEURONTIN) 300 MG capsule Take 300 mg by mouth 2 (two) times daily.     [provider]  insulin glargine (LANTUS) 100 UNIT/ML injection Inject 64 Units into the skin at bedtime.     [provider]  insulin lispro (HUMALOG) 100 UNIT/ML injection Inject 22-30 Units into the skin 3 (three) times daily with meals.     [provider]  lidocaine (LIDODERM) 5 % Place 1 patch onto the skin every 12 (twelve) hours. Remove & Discard patch within 12 hours or as directed by MD 07/11/20 07/11/21  Don Perking, [place], MD  losartan (COZAAR) 50 MG tablet Take 50 mg by mouth daily. 09/27/19   [provider]  omeprazole (PRILOSEC) 40 MG capsule Take 40 mg by mouth daily.     [provider]  ondansetron (ZOFRAN ODT) 4 MG disintegrating tablet  ODT q4 hours prn nausea/vomit 11/13/19   Charlynne Pander, MD  oxyCODONE (ROXICODONE) 5 MG immediate release tablet Take 1 tablet (5 mg total) by mouth every 8 (eight) hours as needed. 07/11/20 07/11/21  Nita Sickle, MD  PARoxetine (PAXIL) 40 MG tablet Take 40 mg by mouth at bedtime.     [provider]  rosuvastatin (CRESTOR) 5 MG tablet Take 2.5 mg by mouth at bedtime.    [provider]  sitaGLIPtin (JANUVIA) 50 MG tablet Take 50 mg by mouth daily.    [provider]  traZODone (DESYREL) 50 MG tablet Take 50 mg by mouth at bedtime.     [provider]    Allergies Codeine, Dilaudid [hydromorphone], Demerol [meperidine], Pravastatin, Lipitor [atorvastatin], Nifedipine, Statins, Sulfa antibiotics, and Vancomycin  Family History  Problem Relation Age of Onset  . COPD Sister        had lung transplant  . COPD Brother     Social History Social History   Tobacco Use  . Smoking status: Never Smoker  . Smokeless tobacco: Never Used  Substance Use Topics  . Alcohol use: No    Alcohol/week: 0.0 standard drinks  . Drug use: No    Review of Systems  Constitutional: Negative for fever. Eyes: Negative for visual changes. ENT: Negative for facial injury or neck injury Cardiovascular: Negative for chest injury. Respiratory: + shortness of breath. Negative for chest wall injury. Gastrointestinal: Negative for abdominal pain or injury. Genitourinary: Negative for dysuria. + R flank pain Musculoskeletal: Negative  for back injury, negative for arm or leg pain. Skin: Negative for laceration/abrasions. Neurological: Negative for head injury.   ____________________________________________   PHYSICAL EXAM:  VITAL SIGNS: ED Triage Vitals  Enc Vitals Group     BP 07/10/20 2127 (!) 160/57     Pulse Rate 07/10/20 2127 83     Resp 07/10/20 2127 (!) 24     Temp 07/10/20 2127 98.4 F (36.9 C)     Temp src --      SpO2 07/10/20 2127 93 %     Weight 07/10/20 2128 260 lb (117.9 kg)     Height 07/10/20 2128 5' 2.5" (  1.588 m)     Head Circumference --      Peak Flow --      Pain Score 07/10/20 2128 10     Pain Loc --      Pain Edu? --      Excl. in GC? --     Full spinal precautions maintained throughout the trauma exam. Constitutional: Alert and oriented. No acute distress. Does not appear intoxicated. HEENT Head: Normocephalic and atraumatic. Face: No facial bony tenderness. Stable midface Ears: No hemotympanum bilaterally. No Battle sign Eyes: No eye injury. PERRL. No raccoon eyes Nose: Nontender. No epistaxis. No rhinorrhea Mouth/Throat: Mucous membranes are moist. No oropharyngeal blood. No dental injury. Airway patent without stridor. Normal voice. Neck: no C-collar. No midline c-spine tenderness.  Cardiovascular: Normal rate, regular rhythm. Normal and symmetric distal pulses are present in all extremities. Pulmonary/Chest: Chest wall is stable and nontender to palpation/compression. Increased WOB, tachypneic, splinting, breath sounds are normal. No crepitus.  Abdominal: Soft, nontender, non distended. Musculoskeletal: Bruising to the R flank. No laceration. Nontender with normal full range of motion in all extremities. No deformities. No thoracic or lumbar midline spinal tenderness. Pelvis is stable. Skin: Skin is warm, dry and intact. No abrasions or contutions. Psychiatric: Speech and behavior are appropriate. Neurological: Normal speech and language. Moves all extremities to command.  No gross focal neurologic deficits are appreciated.  Glascow Coma Score: 4 - Opens eyes on own 6 - Follows simple motor commands 5 - Alert and oriented GCS: 15   ____________________________________________   LABS (all labs ordered are listed, but only abnormal results are displayed)  Labs Reviewed  CREATININE, SERUM - Abnormal; Notable for the following components:      Result Value   Creatinine, Ser 1.59 (*)    GFR, Estimated 33 (*)    All other components within normal limits   ____________________________________________  EKG  none  ____________________________________________  RADIOLOGY  I have personally reviewed the images performed during this visit and I agree with the Radiologist's read.   Interpretation by Radiologist:  DG Ribs Unilateral W/Chest Right  Result Date: 07/10/2020 CLINICAL DATA:  Fall EXAM: RIGHT RIBS AND CHEST - 3+ VIEW COMPARISON:  None. FINDINGS: No fracture or other bone lesions are seen involving the ribs. There is no evidence of pneumothorax or pleural effusion. Both lungs are clear. Heart size and mediastinal contours are within normal limits. IMPRESSION: Negative. Electronically Signed   By: Deatra Robinson M.D.   On: 07/10/2020 22:36   DG Lumbar Spine Complete  Result Date: 07/10/2020 CLINICAL DATA:  Status post fall. EXAM: LUMBAR SPINE - COMPLETE 4+ VIEW COMPARISON:  January 09, 2018 FINDINGS: There is no evidence of an acute lumbar spine fracture. A chronic compression fracture deformity of the L2 vertebral body is seen. Multiple bilateral radiopaque pedicle screws are seen within the lower thoracic spine and throughout the length of the lumbar spine. These extend into the superior aspect of the sacrum. Marked severity multilevel degenerative changes are also seen throughout the lumbar spine. Surgical clips are seen within the right upper quadrant with radiopaque surgical coils noted within the pelvis on the right. IMPRESSION: 1. No acute  lumbar spine fracture. 2. Chronic compression fracture deformity of the L2 vertebral body. 3. Extensive postoperative and degenerative changes, as described above. Electronically Signed   By: Aram Candela M.D.   On: 07/10/2020 22:40   CT Head Wo Contrast  Result Date: 07/10/2020 CLINICAL DATA:  Fall EXAM: CT HEAD WITHOUT CONTRAST  TECHNIQUE: Contiguous axial images were obtained from the base of the skull through the vertex without intravenous contrast. COMPARISON:  None. FINDINGS: Brain: No evidence of acute territorial infarction, hemorrhage, hydrocephalus,extra-axial collection or mass lesion/mass effect. There is dilatation the ventricles and sulci consistent with age-related atrophy. Low-attenuation changes in the deep white matter consistent with small vessel ischemia. Prior left stomach lacunar infarct is seen. Vascular: No hyperdense vessel or unexpected calcification. Skull: The skull is intact. No fracture or focal lesion identified. Sinuses/Orbits: The visualized paranasal sinuses and mastoid air cells are clear. The orbits and globes intact. Other: None IMPRESSION: No acute intracranial abnormality. Electronically Signed   By: Jonna ClarkBindu  Avutu M.D.   On: 07/10/2020 22:40   CT Chest Wo Contrast  Result Date: 07/11/2020 CLINICAL DATA:  Fall with bruising on right flank. On anticoagulation. EXAM: CT CHEST WITHOUT CONTRAST. CT ABDOMEN, AND PELVIS WITH CONTRAST TECHNIQUE: Multidetector CT imaging of the chest was performed following standard protocol without IV contrast. Multidetector CT imaging of the abdomen and pelvis was performed following the standard protocol during bolus administration of intravenous contrast. CONTRAST:  100mL OMNIPAQUE IOHEXOL 300 MG/ML  SOLN COMPARISON:  Rib radiographs yesterday. Abdominopelvic CT 11/13/2019 FINDINGS: CT CHEST FINDINGS Cardiovascular: Lack of IV contrast limits assessment for acute aortic injury. Allowing for this, there is no peri aortic stranding. Mild  aortic atherosclerosis. Mild cardiomegaly. There are coronary artery calcifications. Trace pericardial fluid without significant effusion. Mediastinum/Nodes: No mediastinal hemorrhage or hematoma. No pneumomediastinum. Small mediastinal lymph nodes are all subcentimeter and likely reactive. Decompressed esophagus. No suspicious thyroid nodule. Lungs/Pleura: No pneumothorax or pulmonary contusion. There is central bronchial thickening. Mild dependent atelectasis in the left greater than right lower lobe. No pleural fluid. No pulmonary mass or suspicious nodule. Musculoskeletal: Fracture of the right anterolateral seventh rib is chronic with surrounding callus formation. There is no evidence of acute right rib fracture. No left rib fractures. The sternum, included clavicles and shoulder girdles are intact. Bilateral glenohumeral osteoarthritis. Spinal fusion hardware in the lower thoracic spine, intact were visualized. Multilevel degenerative change in the spine. No acute thoracic spine fracture. No confluent chest wall contusion. CT ABDOMEN PELVIS FINDINGS Hepatobiliary: No hepatic injury or perihepatic hematoma. No focal hepatic lesion. Cholecystectomy without biliary dilatation. Pancreas: No evidence of injury. No ductal dilatation or inflammation. Spleen: No splenic injury or perisplenic hematoma. Adrenals/Urinary Tract: No adrenal hemorrhage or renal injury identified. Mild bilateral perinephric edema is unchanged from prior exam. Calcification in the left renal pelvis is felt to be vascular rather than nonobstructing stone. Bladder is unremarkable. Stomach/Bowel: No evidence of bowel injury or mesenteric hematoma. Decompressed stomach. There is no small bowel obstruction or inflammation. Colonic diverticulosis is most prominent in the transverse and sigmoid colon. No diverticulitis. Normal appendix. Vascular/Lymphatic: No vascular injury. Aortic atherosclerosis. No aortic aneurysm. No retroperitoneal fluid. IVC  appears intact. Few prominent bilateral inguinal nodes are likely reactive. Reproductive: Status post hysterectomy. No adnexal masses. Other: No intra-abdominal free air free fluid. Questionable soft tissue edema involving the right lateral flank soft tissues, portions are obscured due to abdominal wall abutting the CT gantry and subsequent streak artifact. There is no evidence of confluent hematoma or active extravasation. Prior ventral abdominal wall hernia repair with tacks. Ill-defined stranding in the overlying subcutaneous tissues likely represent scarring. Musculoskeletal: Diffuse lumbosacral fusion hardware. Hardware is intact. No evidence of acute lumbar fracture. No acute pelvic fracture. 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 IMPRESSION: 1. Questionable soft tissue edema involving the right  lateral flank soft tissues, portions are obscured due to abdominal wall abutting the CT gantry and subsequent streak artifact. There is no evidence of confluent hematoma or active extravasation. 2. No additional acute traumatic injury to the chest, abdomen, or pelvis. 3. Chronic/right seventh rib fracture with callus formation. No acute rib fracture. 4. Colonic diverticulosis without diverticulitis. Aortic Atherosclerosis (ICD10-I70.0). Electronically Signed   By: Narda Rutherford M.D.   On: 07/11/2020 01:17   CT ABDOMEN PELVIS W CONTRAST  Result Date: 07/11/2020 CLINICAL DATA:  Fall with bruising on right flank. On anticoagulation. EXAM: CT CHEST WITHOUT CONTRAST. CT ABDOMEN, AND PELVIS WITH CONTRAST TECHNIQUE: Multidetector CT imaging of the chest was performed following standard protocol without IV contrast. Multidetector CT imaging of the abdomen and pelvis was performed following the standard protocol during bolus administration of intravenous contrast. CONTRAST:  OMNIPAQUE IOHEXOL 300 MG/ML  SOLN COMPARISON:  Rib radiographs yesterday. Abdominopelvic CT 11/13/2019 FINDINGS: CT CHEST  FINDINGS Cardiovascular: Lack of IV contrast limits assessment for acute aortic injury. Allowing for this, there is no peri aortic stranding. Mild aortic atherosclerosis. Mild cardiomegaly. There are coronary artery calcifications. Trace pericardial fluid without significant effusion. Mediastinum/Nodes: No mediastinal hemorrhage or hematoma. No pneumomediastinum. Small mediastinal lymph nodes are all subcentimeter and likely reactive. Decompressed esophagus. No suspicious thyroid nodule. Lungs/Pleura: No pneumothorax or pulmonary contusion. There is central bronchial thickening. Mild dependent atelectasis in the left greater than right lower lobe. No pleural fluid. No pulmonary mass or suspicious nodule. Musculoskeletal: Fracture of the right anterolateral seventh rib is chronic with surrounding callus formation. There is no evidence of acute right rib fracture. No left rib fractures. The sternum, included clavicles and shoulder girdles are intact. Bilateral glenohumeral osteoarthritis. Spinal fusion hardware in the lower thoracic spine, intact were visualized. Multilevel degenerative change in the spine. No acute thoracic spine fracture. No confluent chest wall contusion. CT ABDOMEN PELVIS FINDINGS Hepatobiliary: No hepatic injury or perihepatic hematoma. No focal hepatic lesion. Cholecystectomy without biliary dilatation. Pancreas: No evidence of injury. No ductal dilatation or inflammation. Spleen: No splenic injury or perisplenic hematoma. Adrenals/Urinary Tract: No adrenal hemorrhage or renal injury identified. Mild bilateral perinephric edema is unchanged from prior exam. Calcification in the left renal pelvis is felt to be vascular rather than nonobstructing stone. Bladder is unremarkable. Stomach/Bowel: No evidence of bowel injury or mesenteric hematoma. Decompressed stomach. There is no small bowel obstruction or inflammation. Colonic diverticulosis is most prominent in the transverse and sigmoid colon. No  diverticulitis. Normal appendix. Vascular/Lymphatic: No vascular injury. Aortic atherosclerosis. No aortic aneurysm. No retroperitoneal fluid. IVC appears intact. Few prominent bilateral inguinal nodes are likely reactive. Reproductive: Status post hysterectomy. No adnexal masses. Other: No intra-abdominal free air free fluid. Questionable soft tissue edema involving the right lateral flank soft tissues, portions are obscured due to abdominal wall abutting the CT gantry and subsequent streak artifact. There is no evidence of confluent hematoma or active extravasation. Prior ventral abdominal wall hernia repair with tacks. Ill-defined stranding in the overlying subcutaneous tissues likely represent scarring. Musculoskeletal: Diffuse lumbosacral fusion hardware. Hardware is intact. No evidence of acute lumbar fracture. No acute pelvic fracture. 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 IMPRESSION: 1. Questionable soft tissue edema involving the right lateral flank soft tissues, portions are obscured due to abdominal wall abutting the CT gantry and subsequent streak artifact. There is no evidence of confluent hematoma or active extravasation. 2. No additional acute traumatic injury to the chest, abdomen, or pelvis. 3. Chronic/right seventh rib  fracture with callus formation. No acute rib fracture. 4. Colonic diverticulosis without diverticulitis. Aortic Atherosclerosis (ICD10-I70.0). Electronically Signed   By: Narda Rutherford M.D.   On: 07/11/2020 01:17     ____________________________________________   PROCEDURES  Procedure(s) performed:yes .1-3 Lead EKG Interpretation Performed by: Nita Sickle, MD Authorized by: Nita Sickle, MD     Interpretation: non-specific     ECG rate assessment: normal     Rhythm: sinus rhythm     Ectopy: none     Critical Care performed:  None ____________________________________________   INITIAL IMPRESSION / ASSESSMENT AND PLAN / ED  COURSE   79 y.o. female with several chronic comorbidities as listed below including paroxysmal A. fib on Eliquis who presents after mechanical fall in the shower. Patient looks in significant amount of pain, splinting, with mild increased work of breathing, tachypneic but not hypoxic, lung sounds are present bilaterally.  She does have a pretty large hematoma to the right flank.  Chest x-ray visualized by me with no signs of pneumothorax or rib fractures, confirmed by radiology.  X-ray of the lumbar spine also visualized by me showing no acute findings, confirmed by radiology.  CT of the head with no intracranial abnormalities, visualized by me confirmed by radiology.  Because patient is on blood thinners and has a pretty significant hematoma of the right flank we will do a CT abdomen pelvis with contrast to rule out internal bleed.  Since patient has increased work of breathing and splinting we will get a CT of the chest to rule out an occult rib fracture or pneumothorax not seen on chest x-ray.  We will treat her pain with IV fentanyl and p.o. Tylenol.  Old medical records reviewed showing no history of COPD or asthma.  Patient placed on telemetry for close monitoring.  _________________________ 1:51 AM on 07/11/2020 -----------------------------------------  CT of the chest, abdomen and pelvis reviewed by me with no evidence of pneumothorax, acute rib fractures, or intra-abdominal injury, confirmed by radiology.  Pain is well controlled and patient was switched to oral medication with 1000 mg of Tylenol, 5 mg of oxycodone, and a Lidoderm patch.  She does have a walker at home.  She was able to ambulate in the emergency room.  Her breathing has improved once the pain was controlled.  Discussed pain care at home and follow-up with primary care doctor.     ____________________________________________  Please note:  Patient was evaluated in Emergency Department today for the symptoms described in the  history of present illness. Patient was evaluated in the context of the global COVID-19 pandemic, which necessitated consideration that the patient might be at risk for infection with the SARS-CoV-2 virus that causes COVID-19. Institutional protocols and algorithms that pertain to the evaluation of patients at risk for COVID-19 are in a state of rapid change based on information released by regulatory bodies including the CDC and federal and state organizations. These policies and algorithms were followed during the patient's care in the ED.  Some ED evaluations and interventions may be delayed as a result of limited staffing during the pandemic.   ____________________________________________   FINAL CLINICAL IMPRESSION(S) / ED DIAGNOSES   Final diagnoses:  Fall, initial encounter  Hematoma of right flank, initial encounter      NEW MEDICATIONS STARTED DURING THIS VISIT:  ED Discharge Orders         Ordered    acetaminophen (TYLENOL) 500 MG tablet  Every 8 hours PRN  07/11/20 0125    lidocaine (LIDODERM) 5 %  Every 12 hours        07/11/20 0125    oxyCODONE (ROXICODONE) 5 MG immediate release tablet  Every 8 hours PRN        07/11/20 0148           Note:  This document was prepared using Dragon voice recognition software and may include unintentional dictation errors.    Nita Sickle, MD 07/11/20 [phone]

## 2020-07-14 ENCOUNTER — Emergency Department
Admission: EM | Admit: 2020-07-14 | Discharge: 2020-07-14 | Disposition: A | Discharge status: Home / Self Care | Payer: Medicare Other | Attending: Emergency Medicine | Admitting: Emergency Medicine

## 2020-07-14 ENCOUNTER — Emergency Department: Payer: Medicare Other

## 2020-07-14 ENCOUNTER — Encounter: Payer: Self-pay | Admitting: Radiology

## 2020-07-14 ENCOUNTER — Other Ambulatory Visit: Payer: Self-pay

## 2020-07-14 DIAGNOSIS — J441 Chronic obstructive pulmonary disease with (acute) exacerbation: Secondary | ICD-10-CM | POA: Insufficient documentation

## 2020-07-14 DIAGNOSIS — W19XXXA Unspecified fall, initial encounter: Secondary | ICD-10-CM | POA: Insufficient documentation

## 2020-07-14 DIAGNOSIS — I10 Essential (primary) hypertension: Secondary | ICD-10-CM | POA: Diagnosis not present

## 2020-07-14 DIAGNOSIS — Y92009 Unspecified place in unspecified non-institutional (private) residence as the place of occurrence of the external cause: Secondary | ICD-10-CM | POA: Diagnosis not present

## 2020-07-14 DIAGNOSIS — Z79899 Other long term (current) drug therapy: Secondary | ICD-10-CM | POA: Insufficient documentation

## 2020-07-14 DIAGNOSIS — S299XXA Unspecified injury of thorax, initial encounter: Secondary | ICD-10-CM | POA: Diagnosis present

## 2020-07-14 DIAGNOSIS — S2231XA Fracture of one rib, right side, initial encounter for closed fracture: Secondary | ICD-10-CM | POA: Insufficient documentation

## 2020-07-14 DIAGNOSIS — Z96652 Presence of left artificial knee joint: Secondary | ICD-10-CM | POA: Insufficient documentation

## 2020-07-14 DIAGNOSIS — Z7901 Long term (current) use of anticoagulants: Secondary | ICD-10-CM | POA: Diagnosis not present

## 2020-07-14 DIAGNOSIS — Z794 Long term (current) use of insulin: Secondary | ICD-10-CM | POA: Diagnosis not present

## 2020-07-14 DIAGNOSIS — Z7982 Long term (current) use of aspirin: Secondary | ICD-10-CM | POA: Insufficient documentation

## 2020-07-14 DIAGNOSIS — E119 Type 2 diabetes mellitus without complications: Secondary | ICD-10-CM | POA: Insufficient documentation

## 2020-07-14 DIAGNOSIS — I509 Heart failure, unspecified: Secondary | ICD-10-CM

## 2020-07-14 LAB — COMPREHENSIVE METABOLIC PANEL
ALT: 25 U/L (ref 0–44)
AST: 28 U/L (ref 15–41)
Albumin: 3.9 g/dL (ref 3.5–5.0)
Alkaline Phosphatase: 84 U/L (ref 38–126)
Anion gap: 12 (ref 5–15)
BUN: 35 mg/dL — ABNORMAL HIGH (ref 8–23)
CO2: 30 mmol/L (ref 22–32)
Calcium: 9.1 mg/dL (ref 8.9–10.3)
Chloride: 98 mmol/L (ref 98–111)
Creatinine, Ser: 1.24 mg/dL — ABNORMAL HIGH (ref 0.44–1.00)
GFR, Estimated: 45 mL/min — ABNORMAL LOW (ref >60)
Glucose, Bld: 127 mg/dL — ABNORMAL HIGH (ref 70–99)
Potassium: 4.3 mmol/L (ref 3.5–5.1)
Sodium: 140 mmol/L (ref 135–145)
Total Bilirubin: 0.9 mg/dL (ref 0.3–1.2)
Total Protein: 7.6 g/dL (ref 6.5–8.1)

## 2020-07-14 LAB — CBC
HCT: 41.6 % (ref 36.0–46.0)
Hemoglobin: 12.9 g/dL (ref 12.0–15.0)
MCH: 29.1 pg (ref 26.0–34.0)
MCHC: 31 g/dL (ref 30.0–36.0)
MCV: 93.9 fL (ref 80.0–100.0)
Platelets: 166 10*3/uL (ref 150–400)
RBC: 4.43 MIL/uL (ref 3.87–5.11)
RDW: 15.9 % — ABNORMAL HIGH (ref 11.5–15.5)
WBC: 8.8 10*3/uL (ref 4.0–10.5)
nRBC: 0.2 % (ref 0.0–0.2)

## 2020-07-14 LAB — TROPONIN I (HIGH SENSITIVITY)
Troponin I (High Sensitivity): 10 ng/L (ref <18)
Troponin I (High Sensitivity): 11 ng/L (ref <18)

## 2020-07-14 MED ORDER — ALBUTEROL SULFATE (2.5 MG/3ML) 0.083% IN NEBU
5.0000 mg | INHALATION_SOLUTION | Freq: Once | RESPIRATORY_TRACT | Status: AC
  Administered 2020-07-14: 5 mg via RESPIRATORY_TRACT
  Filled 2020-07-14: qty 6

## 2020-07-14 MED ORDER — METHYLPREDNISOLONE SODIUM SUCC 125 MG IJ SOLR
125.0000 mg | Freq: Once | INTRAMUSCULAR | Status: AC
  Administered 2020-07-14: 125 mg via INTRAVENOUS
  Filled 2020-07-14: qty 2

## 2020-07-14 MED ORDER — ACETAMINOPHEN 500 MG PO TABS
1000.0000 mg | ORAL_TABLET | Freq: Once | ORAL | Status: AC
  Administered 2020-07-14: 1000 mg via ORAL
  Filled 2020-07-14: qty 2

## 2020-07-14 MED ORDER — DOXYCYCLINE HYCLATE 100 MG PO CAPS: 100.0000 mg | ORAL_CAPSULE | Freq: Two times a day (BID) | ORAL | 0 refills | Status: AC

## 2020-07-14 MED ORDER — PREDNISONE 20 MG PO TABS: 40.0000 mg | ORAL_TABLET | Freq: Every day | ORAL | 0 refills | Status: DC

## 2020-07-14 MED ORDER — IOHEXOL 350 MG/ML SOLN
75.0000 mL | Freq: Once | INTRAVENOUS | Status: AC | PRN
  Administered 2020-07-14: 75 mL via INTRAVENOUS

## 2020-07-14 MED ORDER — IPRATROPIUM-ALBUTEROL 0.5-2.5 (3) MG/3ML IN SOLN
3.0000 mL | Freq: Once | RESPIRATORY_TRACT | Status: AC
  Administered 2020-07-14: 3 mL via RESPIRATORY_TRACT
  Filled 2020-07-14: qty 3

## 2020-07-14 NOTE — ED Notes (Signed)
Pt refuses to wear O2; pulse ox 93% on ra; pt to BR, stand-by assist only

## 2020-07-14 NOTE — ED Triage Notes (Addendum)
Pt fell Monday and was seen here for the same. All imaging was wnl at that time. Pt here persistent pain to right rib and lower back pain. Pt states today she started having right sided neck pain, no new injury. Pt also co shob that started today, does have hx of copd.

## 2020-07-14 NOTE — Discharge Instructions (Addendum)
Your scan today shows two broken ribs in the lower right side of your chest.  There are painful but will heal on their own without intervention.  For now, take tylenol 1000mg  three times a day and use ice intermittently on the painful area.  Take prednisone and doxycycline as prescribed for your wheezing.  Keep taking all of your medications as prescribed.  Continue using your Duo-Neb at home every 4 hours for the next few days until your wheezing has resolved.

## 2020-07-14 NOTE — ED Provider Notes (Signed)
[facility] Emergency Department Provider Note  ____________________________________________  Time seen: Approximately 8:07 AM  I have reviewed the triage vital signs and the nursing notes.   HISTORY  Chief Complaint Fall    HPI CHINYERE GALIANO is a 79 y.o. female with a history of COPD, diabetes, hypertension, GERD, prior stroke who comes to the ED complaining of right-sided chest pain from right lower chest all the way up to the right clavicle area, gradual onset over the past 2 or 3 days since having a fall at home, constant, worse with breathing, associated with shortness of breath, no alleviating factors, nonradiating, 10/10 in intensity.  No new falls or injury since she had a fall at home 3 days ago.  She was seen in the ED for that, had imaging including CT of the head chest abdomen pelvis which was all unremarkable.   She reports being mostly compliant with her medications but sometimes misses days for unclear reasons.  She reports she has not taken her medicine yesterday or today.  She is on Eliquis at home.  Denies fever or cough.  No paresthesias or motor weakness.  No change in bowel or bladder habits.     Past Medical History:  Diagnosis Date  . Anemia of chronic disease   . Anxiety   . Arthritis   . Asthma    no problems since 1999 (after moving into a new home)  . Chronic airway obstruction (HCC)   . Complication of anesthesia    " feel like I'm dying after I wake up"  . Diabetes mellitus without complication (HCC)   . Diverticulosis   . Dysrhythmia    unknown type   . Fibromyalgia   . Flat back syndrome   . GERD (gastroesophageal reflux disease)   . Hx of transfusion 2011  . Hypertension   . Itching   . Macular degeneration   . Nocturia   . Restless leg syndrome   . Sleep apnea    Dx 1990's unable to wear c-pap -  . Stroke (HCC) 2010   verbal aphasia x 30 min - resolved - no problem since then     Patient Active Problem  List   Diagnosis Date Noted  . SDH (subdural hematoma) (HCC) 12/11/2013  . Postoperative wound infection--lumbar spine/2015 12/11/2013  . CVA (cerebral infarction) 12/11/2013  . Postoperative anemia due to acute blood loss 04/09/2013  . Diabetes (HCC) 04/07/2013  . Hypertension 04/07/2013  . Unspecified sleep apnea 04/07/2013  . OA (osteoarthritis) of knee 04/06/2013     Past Surgical History:  Procedure Laterality Date  . ABDOMINAL HYSTERECTOMY     rt so  . BACK SURGERY  2010 / 2011  . BREAST REDUCTION SURGERY    . BURR HOLE FOR SUBDURAL HEMATOMA  2015  . cataracts removed    . CHOLECYSTECTOMY  1990  . DILATION AND CURETTAGE OF UTERUS    . HERNIA REPAIR    . POSTERIOR LAMINECTOMY / DECOMPRESSION LUMBAR SPINE     with T10-S1 fusion  . TONSILLECTOMY    . TOTAL KNEE ARTHROPLASTY Left 04/06/2013   Procedure: LEFT TOTAL KNEE ARTHROPLASTY;  Surgeon: Loanne Drilling, MD;  Location: [facility];  Service: Orthopedics;  Laterality: Left;     Prior to Admission medications   Medication Sig Start Date End Date Taking? Authorizing Provider  acetaminophen (TYLENOL) 500 MG tablet Take 500-1,000 mg by mouth every 6 (six) hours as needed for mild pain or fever.  [provider]  acetaminophen (TYLENOL) 500 MG tablet Take 2 tablets (1,000 mg total) by mouth every 8 (eight) hours as needed for mild pain or moderate pain. 07/11/20 07/11/21  Nita Sickle, MD  aspirin EC 81 MG tablet Take 81 mg by mouth daily.    [provider]  carvedilol (COREG) 25 MG tablet Take 25 mg by mouth 2 (two) times daily.     [provider]  doxycycline (VIBRAMYCIN) 100 MG capsule Take 1 capsule (100 mg total) by mouth 2 (two) times daily for 7 days. 07/14/20 07/21/20  Sharman Cheek, MD  ELIQUIS 5 MG TABS tablet Take 5 mg by mouth 2 (two) times daily. 10/27/19   [provider]  furosemide (LASIX) 20 MG tablet Take 20 mg by mouth daily.     [provider]   gabapentin (NEURONTIN) 300 MG capsule Take 300 mg by mouth 2 (two) times daily.     [provider]  insulin glargine (LANTUS) 100 UNIT/ML injection Inject 64 Units into the skin at bedtime.     [provider]  insulin lispro (HUMALOG) 100 UNIT/ML injection Inject 22-30 Units into the skin 3 (three) times daily with meals.     [provider]  lidocaine (LIDODERM) 5 % Place 1 patch onto the skin every 12 (twelve) hours. Remove & Discard patch within 12 hours or as directed by MD 07/11/20 07/11/21  Don Perking, [place], MD  losartan (COZAAR) 50 MG tablet Take 50 mg by mouth daily. 09/27/19   [provider]  omeprazole (PRILOSEC) 40 MG capsule Take 40 mg by mouth daily.     [provider]  ondansetron (ZOFRAN ODT) 4 MG disintegrating tablet 4mg  ODT q4 hours prn nausea/vomit 11/13/19   11/15/19, MD  oxyCODONE (ROXICODONE) 5 MG immediate release tablet Take 1 tablet (5 mg total) by mouth every 8 (eight) hours as needed. 07/11/20 07/11/21  07/13/21, MD  PARoxetine (PAXIL) 40 MG tablet Take 40 mg by mouth at bedtime.     [provider]  predniSONE (DELTASONE) 20 MG tablet Take 2 tablets (40 mg total) by mouth daily. 07/14/20   07/16/20, MD  rosuvastatin (CRESTOR) 5 MG tablet Take 2.5 mg by mouth at bedtime.    [provider]  sitaGLIPtin (JANUVIA) 50 MG tablet Take 50 mg by mouth daily.    [provider]  traZODone (DESYREL) 50 MG tablet Take 50 mg by mouth at bedtime.     [provider]     Allergies Codeine, Dilaudid [hydromorphone], Demerol [meperidine], Pravastatin, Lipitor [atorvastatin], Nifedipine, Statins, Sulfa antibiotics, and Vancomycin   Family History  Problem Relation Age of Onset  . COPD Sister        had lung transplant  . COPD Brother     Social History Social History   Tobacco Use  . Smoking status: Never Smoker  . Smokeless tobacco: Never Used  Substance Use  Topics  . Alcohol use: No    Alcohol/week: 0.0 standard drinks  . Drug use: No    Review of Systems  Constitutional:   No fever or chills.  ENT:   No sore throat. No rhinorrhea. Cardiovascular: Positive chest pain as above without palpitations or syncope. Respiratory: Positive shortness of breath as above without cough. Gastrointestinal:   Negative for abdominal pain, vomiting and diarrhea.  Musculoskeletal:   Negative for focal pain or swelling All other systems reviewed and are negative except as documented above in ROS and HPI.  ____________________________________________   PHYSICAL EXAM:  VITAL SIGNS: ED Triage Vitals  Enc Vitals Group     BP 07/14/20 0057 (!) 170/70     Pulse Rate 07/14/20 0057 82     Resp 07/14/20 0057 (!) 24     Temp 07/14/20 0057 99.2 F (37.3 C)     Temp Source 07/14/20 0057 Oral     SpO2 07/14/20 0057 91 %     Weight 07/14/20 0053 259 lb 14.8 oz (117.9 kg)     Height --      Head Circumference --      Peak Flow --      Pain Score 07/14/20 0052 10     Pain Loc --      Pain Edu? --      Excl. in GC? --     Vital signs reviewed, nursing assessments reviewed.   Constitutional:   Alert and oriented. Non-toxic appearance. Eyes:   Conjunctivae are normal. EOMI. PERRL. ENT      Head:   Normocephalic and atraumatic.      Nose:   Normal      Mouth/Throat: Moist mucous membranes.      Neck:   No meningismus. Full ROM. Hematological/Lymphatic/Immunilogical:   No cervical lymphadenopathy. Cardiovascular:   RRR. Symmetric bilateral radial and DP pulses.  No murmurs. Cap refill less than 2 seconds. Respiratory: Tachypnea with a respiratory rate of 22, may be physiologic due to obesity.  Symmetric air entry in all lung fields.  There is diffuse expiratory wheezing and prolonged expiratory phase, accentuated by FEV1 maneuver. Gastrointestinal:   Soft and nontender. Non distended. There is no CVA tenderness.  No rebound, rigidity, or  guarding.  Musculoskeletal:   There is tenderness over the right chest wall reproducing her symptoms.  No crepitus or step-off along the rib contours. Normal range of motion in all extremities. No joint effusions.  No lower extremity tenderness.  No edema.  Symmetric calf circumference. Neurologic:   Normal speech and language.  Motor grossly intact. No acute focal neurologic deficits are appreciated.  Skin:    Skin is warm, dry and intact. No rash noted.  No petechiae, purpura, or bullae.  ____________________________________________    LABS (pertinent positives/negatives) (all labs ordered are listed, but only abnormal results are displayed) Labs Reviewed  CBC - Abnormal; Notable for the following components:      Result Value   RDW 15.9 (*)    All other components within normal limits  COMPREHENSIVE METABOLIC PANEL - Abnormal; Notable for the following components:   Glucose, Bld 127 (*)    BUN 35 (*)    Creatinine, Ser 1.24 (*)    GFR, Estimated 45 (*)    All other components within normal limits  TROPONIN I (HIGH SENSITIVITY)  TROPONIN I (HIGH SENSITIVITY)   ____________________________________________   EKG  Interpreted by me Normal sinus rhythm rate of 80, normal axis and intervals.  Poor R wave progression.  Normal ST segments and T waves.  No acute ischemic changes.  ____________________________________________    RADIOLOGY  DG Chest 2 View  Result Date: 07/14/2020 CLINICAL DATA:  Shortness of breath EXAM: CHEST - 2 VIEW COMPARISON:  July 07, 2020 FINDINGS: The heart size and mediastinal contours are within normal limits. Shallow degree of aeration. Both lungs are clear. The visualized skeletal structures are unremarkable. IMPRESSION: No active cardiopulmonary disease. Electronically Signed   By: Jonna ClarkBindu  Avutu M.D.   On: 07/14/2020 01:20   CT Angio Chest PE  W and/or Wo Contrast  Result Date: 07/14/2020 CLINICAL DATA:  Right rib pain. EXAM: CT ANGIOGRAPHY  CHEST WITH CONTRAST TECHNIQUE: Multidetector CT imaging of the chest was performed using the standard protocol during bolus administration of intravenous contrast. Multiplanar CT image reconstructions and MIPs were obtained to evaluate the vascular anatomy. CONTRAST:  41mL OMNIPAQUE IOHEXOL 350 MG/ML SOLN COMPARISON:  July 11, 2020. FINDINGS: Cardiovascular: Satisfactory opacification of the pulmonary arteries to the segmental level. No evidence of pulmonary embolism. Mild cardiomegaly. No pericardial effusion. Mediastinum/Nodes: No enlarged mediastinal, hilar, or axillary lymph nodes. Thyroid gland, trachea, and esophagus demonstrate no significant findings. Lungs/Pleura: Lungs are clear. No pleural effusion or pneumothorax. Upper Abdomen: No acute abnormality. Musculoskeletal: Interval development of minimally displaced fractures involving lateral portion of the right seventh and eighth ribs. Review of the MIP images confirms the above findings. IMPRESSION: 1. No definite evidence of pulmonary embolus. 2. Interval development of minimally displaced fractures involving lateral portion of the right seventh and eighth ribs. Electronically Signed   By: Lupita Raider M.D.   On: 07/14/2020 08:20   CT Cervical Spine Wo Contrast  Result Date: 07/14/2020 CLINICAL DATA:  Fall pain EXAM: CT CERVICAL SPINE WITHOUT CONTRAST TECHNIQUE: Multidetector CT imaging of the cervical spine was performed without intravenous contrast. Multiplanar CT image reconstructions were also generated. COMPARISON:  None. FINDINGS: Alignment: Physiologic Skull base and vertebrae: Visualized skull base is intact. No atlanto-occipital dissociation. The vertebral body heights are well maintained. No fracture or pathologic osseous lesion seen. Soft tissues and spinal canal: The visualized paraspinal soft tissues are unremarkable. No prevertebral soft tissue swelling is seen. The spinal canal is grossly unremarkable, no large epidural  collection or significant canal narrowing. Disc levels: Multilevel cervical spine spondylosis is seen with disc height loss, disc osteophyte complex and uncovertebral osteophytes most notable at C5-C7 with moderate to severe neural foraminal narrowing and mild central canal stenosis. Upper chest: The lung apices are clear. Thoracic inlet is within normal limits. Other: None IMPRESSION: No acute fracture or malalignment of the spine. Cervical spine spondylosis most notable from C5 through C7. Electronically Signed   By: Jonna Clark M.D.   On: 07/14/2020 01:52    ____________________________________________   PROCEDURES Procedures  ____________________________________________  DIFFERENTIAL DIAGNOSIS   Musculoskeletal pain, COPD exacerbation, pneumonia, pleural effusion, pneumothorax, rib fracture, pulmonary believes him  CLINICAL IMPRESSION / ASSESSMENT AND PLAN / ED COURSE  Medications ordered in the ED: Medications  methylPREDNISolone sodium succinate (SOLU-MEDROL) 125 mg/2 mL injection 125 mg (125 mg Intravenous Given 07/14/20 0754)  ipratropium-albuterol (DUONEB) 0.5-2.5 (3) MG/3ML nebulizer solution 3 mL (3 mLs Nebulization Given 07/14/20 0751)  albuterol (PROVENTIL) (2.5 MG/3ML) 0.083% nebulizer solution 5 mg (5 mg Nebulization Given 07/14/20 0751)  acetaminophen (TYLENOL) tablet 1,000 mg (1,000 mg Oral Given 07/14/20 0749)  iohexol (OMNIPAQUE) 350 MG/ML injection 75 mL (75 mLs Intravenous Contrast Given 07/14/20 0802)    Pertinent labs & imaging results that were available during my care of the patient were reviewed by me and considered in my medical decision making (see chart for details).  DONIE MOULTON was evaluated in Emergency Department on 07/14/2020 for the symptoms described in the history of present illness. She was evaluated in the context of the global COVID-19 pandemic, which necessitated consideration that the patient might be at risk for infection with the SARS-CoV-2  virus that causes COVID-19. Institutional protocols and algorithms that pertain to the evaluation of patients at risk for COVID-19 are in a state of  rapid change based on information released by regulatory bodies including the CDC and federal and state organizations. These policies and algorithms were followed during the patient's care in the ED.   Patient presents with severe right chest pain, most likely musculoskeletal and COPD .  Chest x-ray image viewed by me, appears unremarkable.  Radiology report agrees.  Patient has multiple severe comorbidities, so with recent trauma history, I will obtain a CT scan of the chest to rule out PE or other occult causes not evident on the chest x-ray.  Initial labs unremarkable, serial troponins negative, EKG nonischemic.  Doubt pneumonia, patient is not septic.  Clinical Course as of Jul 15 851  Thu Jul 14, 2020  0840 CTA negative for PE or other severe findings.  Does show a nondisplaced rib fracture #7 and 8 on the right corresponding to her symptoms.  We will continue a course of prednisone, antibiotic due to underlying COPD, Tylenol for pain.   [PS]    Clinical Course User Index [PS] Sharman Cheek, MD     ____________________________________________   FINAL CLINICAL IMPRESSION(S) / ED DIAGNOSES    Final diagnoses:  COPD exacerbation (HCC)  Closed traumatic nondisplaced fracture of one rib of right side, initial encounter  Type 2 diabetes mellitus without complication, with long-term current use of insulin (HCC)  Chronic congestive heart failure, unspecified heart failure type (HCC)  Morbid obesity [place])     ED Discharge Orders         Ordered    predniSONE (DELTASONE) 20 MG tablet  Daily        07/14/20 0851    doxycycline (VIBRAMYCIN) 100 MG capsule  2 times daily        07/14/20 0851          Portions of this note were generated with dragon dictation software. Dictation errors may occur despite best attempts at  proofreading.   Sharman Cheek, MD 07/14/20 [phone]

## 2020-12-27 ENCOUNTER — Emergency Department: Payer: Medicare Other

## 2020-12-27 ENCOUNTER — Other Ambulatory Visit: Payer: Self-pay

## 2020-12-27 ENCOUNTER — Observation Stay
Admission: EM | Admit: 2020-12-27 | Discharge: 2020-12-28 | Disposition: A | Discharge status: Home / Self Care | Payer: Medicare Other | Attending: Internal Medicine | Admitting: Internal Medicine

## 2020-12-27 DIAGNOSIS — S00531A Contusion of lip, initial encounter: Secondary | ICD-10-CM | POA: Diagnosis not present

## 2020-12-27 DIAGNOSIS — Z79899 Other long term (current) drug therapy: Secondary | ICD-10-CM | POA: Diagnosis not present

## 2020-12-27 DIAGNOSIS — Z20822 Contact with and (suspected) exposure to covid-19: Secondary | ICD-10-CM | POA: Insufficient documentation

## 2020-12-27 DIAGNOSIS — S8001XA Contusion of right knee, initial encounter: Secondary | ICD-10-CM | POA: Insufficient documentation

## 2020-12-27 DIAGNOSIS — Z7982 Long term (current) use of aspirin: Secondary | ICD-10-CM | POA: Insufficient documentation

## 2020-12-27 DIAGNOSIS — Z7901 Long term (current) use of anticoagulants: Secondary | ICD-10-CM | POA: Diagnosis not present

## 2020-12-27 DIAGNOSIS — Z794 Long term (current) use of insulin: Secondary | ICD-10-CM

## 2020-12-27 DIAGNOSIS — Z96652 Presence of left artificial knee joint: Secondary | ICD-10-CM | POA: Insufficient documentation

## 2020-12-27 DIAGNOSIS — E119 Type 2 diabetes mellitus without complications: Secondary | ICD-10-CM | POA: Diagnosis not present

## 2020-12-27 DIAGNOSIS — J449 Chronic obstructive pulmonary disease, unspecified: Secondary | ICD-10-CM | POA: Insufficient documentation

## 2020-12-27 DIAGNOSIS — J45909 Unspecified asthma, uncomplicated: Secondary | ICD-10-CM | POA: Insufficient documentation

## 2020-12-27 DIAGNOSIS — S0083XA Contusion of other part of head, initial encounter: Secondary | ICD-10-CM

## 2020-12-27 DIAGNOSIS — I480 Paroxysmal atrial fibrillation: Secondary | ICD-10-CM | POA: Insufficient documentation

## 2020-12-27 DIAGNOSIS — S0990XA Unspecified injury of head, initial encounter: Secondary | ICD-10-CM | POA: Diagnosis present

## 2020-12-27 DIAGNOSIS — S60212A Contusion of left wrist, initial encounter: Secondary | ICD-10-CM | POA: Diagnosis not present

## 2020-12-27 DIAGNOSIS — W1811XA Fall from or off toilet without subsequent striking against object, initial encounter: Secondary | ICD-10-CM | POA: Insufficient documentation

## 2020-12-27 DIAGNOSIS — Y92002 Bathroom of unspecified non-institutional (private) residence single-family (private) house as the place of occurrence of the external cause: Secondary | ICD-10-CM | POA: Insufficient documentation

## 2020-12-27 DIAGNOSIS — W19XXXA Unspecified fall, initial encounter: Secondary | ICD-10-CM

## 2020-12-27 DIAGNOSIS — I10 Essential (primary) hypertension: Secondary | ICD-10-CM | POA: Diagnosis not present

## 2020-12-27 LAB — CBC WITH DIFFERENTIAL/PLATELET
Abs Immature Granulocytes: 0.05 10*3/uL (ref 0.00–0.07)
Basophils Absolute: 0 10*3/uL (ref 0.0–0.1)
Basophils Relative: 0 %
Eosinophils Absolute: 0.2 10*3/uL (ref 0.0–0.5)
Eosinophils Relative: 3 %
HCT: 39.3 % (ref 36.0–46.0)
Hemoglobin: 12.5 g/dL (ref 12.0–15.0)
Immature Granulocytes: 1 %
Lymphocytes Relative: 17 %
Lymphs Abs: 1.1 10*3/uL (ref 0.7–4.0)
MCH: 29.4 pg (ref 26.0–34.0)
MCHC: 31.8 g/dL (ref 30.0–36.0)
MCV: 92.5 fL (ref 80.0–100.0)
Monocytes Absolute: 0.7 10*3/uL (ref 0.1–1.0)
Monocytes Relative: 10 %
Neutro Abs: 4.5 10*3/uL (ref 1.7–7.7)
Neutrophils Relative %: 69 %
Platelets: 139 10*3/uL — ABNORMAL LOW (ref 150–400)
RBC: 4.25 MIL/uL (ref 3.87–5.11)
RDW: 15.2 % (ref 11.5–15.5)
WBC: 6.5 10*3/uL (ref 4.0–10.5)
nRBC: 0 % (ref 0.0–0.2)

## 2020-12-27 LAB — RESP PANEL BY RT-PCR (FLU A&B, COVID) ARPGX2
Influenza A by PCR: NEGATIVE
Influenza B by PCR: NEGATIVE
SARS Coronavirus 2 by RT PCR: NEGATIVE

## 2020-12-27 LAB — COMPREHENSIVE METABOLIC PANEL
ALT: 18 U/L (ref 0–44)
AST: 22 U/L (ref 15–41)
Albumin: 3.2 g/dL — ABNORMAL LOW (ref 3.5–5.0)
Alkaline Phosphatase: 83 U/L (ref 38–126)
Anion gap: 10 (ref 5–15)
BUN: 34 mg/dL — ABNORMAL HIGH (ref 8–23)
CO2: 29 mmol/L (ref 22–32)
Calcium: 8.8 mg/dL — ABNORMAL LOW (ref 8.9–10.3)
Chloride: 100 mmol/L (ref 98–111)
Creatinine, Ser: 1.17 mg/dL — ABNORMAL HIGH (ref 0.44–1.00)
GFR, Estimated: 47 mL/min — ABNORMAL LOW (ref >60)
Glucose, Bld: 155 mg/dL — ABNORMAL HIGH (ref 70–99)
Potassium: 4.1 mmol/L (ref 3.5–5.1)
Sodium: 139 mmol/L (ref 135–145)
Total Bilirubin: 0.5 mg/dL (ref 0.3–1.2)
Total Protein: 6.2 g/dL — ABNORMAL LOW (ref 6.5–8.1)

## 2020-12-27 LAB — CBG MONITORING, ED
Glucose-Capillary: 156 mg/dL — ABNORMAL HIGH (ref 70–99)
Glucose-Capillary: 209 mg/dL — ABNORMAL HIGH (ref 70–99)

## 2020-12-27 LAB — PROTIME-INR
INR: 1.1 (ref 0.8–1.2)
Prothrombin Time: 14.1 seconds (ref 11.4–15.2)

## 2020-12-27 LAB — GLUCOSE, CAPILLARY
Glucose-Capillary: 146 mg/dL — ABNORMAL HIGH (ref 70–99)
Glucose-Capillary: 229 mg/dL — ABNORMAL HIGH (ref 70–99)

## 2020-12-27 LAB — HEMOGLOBIN A1C
Hgb A1c MFr Bld: 9.5 % — ABNORMAL HIGH (ref 4.8–5.6)
Mean Plasma Glucose: 225.95 mg/dL

## 2020-12-27 LAB — TROPONIN I (HIGH SENSITIVITY)
Troponin I (High Sensitivity): 9 ng/L (ref <18)
Troponin I (High Sensitivity): 8 ng/L (ref <18)

## 2020-12-27 MED ORDER — SODIUM CHLORIDE 0.9 % IV SOLN: 250.0000 mL | INTRAVENOUS | Status: DC | PRN

## 2020-12-27 MED ORDER — POLYETHYLENE GLYCOL 3350 17 G PO PACK
17.0000 g | PACK | Freq: Every day | ORAL | Status: DC
  Administered 2020-12-27 – 2020-12-28 (×2): 17 g via ORAL
  Filled 2020-12-27 (×2): qty 1

## 2020-12-27 MED ORDER — ONDANSETRON HCL 4 MG PO TABS: 4.0000 mg | ORAL_TABLET | Freq: Four times a day (QID) | ORAL | Status: DC | PRN

## 2020-12-27 MED ORDER — LINAGLIPTIN 5 MG PO TABS
5.0000 mg | ORAL_TABLET | Freq: Every day | ORAL | Status: DC
  Administered 2020-12-27 – 2020-12-28 (×2): 5 mg via ORAL
  Filled 2020-12-27 (×2): qty 1

## 2020-12-27 MED ORDER — PANTOPRAZOLE SODIUM 40 MG PO TBEC
40.0000 mg | DELAYED_RELEASE_TABLET | Freq: Every day | ORAL | Status: DC
  Administered 2020-12-27 – 2020-12-28 (×2): 40 mg via ORAL
  Filled 2020-12-27 (×2): qty 1

## 2020-12-27 MED ORDER — CARVEDILOL 25 MG PO TABS
25.0000 mg | ORAL_TABLET | Freq: Two times a day (BID) | ORAL | Status: DC
  Administered 2020-12-27 – 2020-12-28 (×3): 25 mg via ORAL
  Filled 2020-12-27 (×3): qty 1

## 2020-12-27 MED ORDER — ONDANSETRON HCL 4 MG/2ML IJ SOLN: 4.0000 mg | Freq: Four times a day (QID) | INTRAMUSCULAR | Status: DC | PRN

## 2020-12-27 MED ORDER — ROSUVASTATIN CALCIUM 5 MG PO TABS
2.5000 mg | ORAL_TABLET | Freq: Every day | ORAL | Status: DC
  Administered 2020-12-27: 2.5 mg via ORAL
  Filled 2020-12-27 (×2): qty 0.5

## 2020-12-27 MED ORDER — ASPIRIN EC 81 MG PO TBEC
81.0000 mg | DELAYED_RELEASE_TABLET | Freq: Every day | ORAL | Status: DC
  Administered 2020-12-27 – 2020-12-28 (×2): 81 mg via ORAL
  Filled 2020-12-27 (×2): qty 1

## 2020-12-27 MED ORDER — TRAZODONE HCL 50 MG PO TABS: 50.0000 mg | ORAL_TABLET | Freq: Every day | ORAL | Status: DC

## 2020-12-27 MED ORDER — OXYCODONE HCL 5 MG PO TABS
5.0000 mg | ORAL_TABLET | Freq: Three times a day (TID) | ORAL | Status: DC | PRN
Start: 2020-12-27 — End: 2020-12-28

## 2020-12-27 MED ORDER — GABAPENTIN 300 MG PO CAPS: 300.0000 mg | ORAL_CAPSULE | Freq: Every day | ORAL | Status: DC

## 2020-12-27 MED ORDER — LOSARTAN POTASSIUM 50 MG PO TABS
50.0000 mg | ORAL_TABLET | Freq: Every day | ORAL | Status: DC
  Administered 2020-12-27 – 2020-12-28 (×2): 50 mg via ORAL
  Filled 2020-12-27 (×3): qty 1

## 2020-12-27 MED ORDER — MAGNESIUM HYDROXIDE 400 MG/5ML PO SUSP: 30.0000 mL | Freq: Every day | ORAL | Status: DC | PRN

## 2020-12-27 MED ORDER — TRAZODONE HCL 50 MG PO TABS: 50.0000 mg | ORAL_TABLET | Freq: Every evening | ORAL | Status: DC | PRN

## 2020-12-27 MED ORDER — SODIUM CHLORIDE 0.9% FLUSH
3.0000 mL | Freq: Two times a day (BID) | INTRAVENOUS | Status: DC
  Administered 2020-12-27 – 2020-12-28 (×3): 3 mL via INTRAVENOUS

## 2020-12-27 MED ORDER — ALBUTEROL SULFATE HFA 108 (90 BASE) MCG/ACT IN AERS
1.0000 | INHALATION_SPRAY | RESPIRATORY_TRACT | Status: DC | PRN
  Filled 2020-12-27: qty 6.7

## 2020-12-27 MED ORDER — NYSTATIN 100000 UNIT/GM EX CREA
TOPICAL_CREAM | Freq: Two times a day (BID) | CUTANEOUS | Status: DC | PRN
  Filled 2020-12-27: qty 15

## 2020-12-27 MED ORDER — LIDOCAINE 5 % EX PTCH
1.0000 | MEDICATED_PATCH | Freq: Two times a day (BID) | CUTANEOUS | Status: DC
  Administered 2020-12-27 – 2020-12-28 (×3): 1 via TRANSDERMAL
  Filled 2020-12-27 (×4): qty 1

## 2020-12-27 MED ORDER — FUROSEMIDE 20 MG PO TABS
20.0000 mg | ORAL_TABLET | Freq: Every day | ORAL | Status: DC
  Administered 2020-12-27 – 2020-12-28 (×2): 20 mg via ORAL
  Filled 2020-12-27 (×2): qty 1

## 2020-12-27 MED ORDER — UMECLIDINIUM BROMIDE 62.5 MCG/INH IN AEPB
1.0000 | INHALATION_SPRAY | Freq: Every day | RESPIRATORY_TRACT | Status: DC
  Administered 2020-12-27 – 2020-12-28 (×2): 1 via RESPIRATORY_TRACT
  Filled 2020-12-27: qty 7

## 2020-12-27 MED ORDER — INSULIN ASPART 100 UNIT/ML ~~LOC~~ SOLN
0.0000 [IU] | Freq: Three times a day (TID) | SUBCUTANEOUS | Status: DC
  Administered 2020-12-27 (×2): 3 [IU] via SUBCUTANEOUS
  Administered 2020-12-27: 1 [IU] via SUBCUTANEOUS
  Administered 2020-12-27: 2 [IU] via SUBCUTANEOUS
  Administered 2020-12-28: 3 [IU] via SUBCUTANEOUS
  Filled 2020-12-27 (×5): qty 1

## 2020-12-27 MED ORDER — NYSTATIN 100000 UNIT/GM EX POWD
Freq: Two times a day (BID) | CUTANEOUS | Status: DC | PRN
  Filled 2020-12-27: qty 15

## 2020-12-27 MED ORDER — ACETAMINOPHEN 650 MG RE SUPP: 650.0000 mg | Freq: Four times a day (QID) | RECTAL | Status: DC | PRN

## 2020-12-27 MED ORDER — ACETAMINOPHEN 325 MG PO TABS: 650.0000 mg | ORAL_TABLET | Freq: Four times a day (QID) | ORAL | Status: DC | PRN

## 2020-12-27 MED ORDER — MORPHINE SULFATE (PF) 2 MG/ML IV SOLN: 1.0000 mg | INTRAVENOUS | Status: DC | PRN

## 2020-12-27 MED ORDER — GABAPENTIN 300 MG PO CAPS
300.0000 mg | ORAL_CAPSULE | Freq: Two times a day (BID) | ORAL | Status: DC
  Administered 2020-12-27: 300 mg via ORAL
  Filled 2020-12-27: qty 1

## 2020-12-27 MED ORDER — TRAZODONE HCL 50 MG PO TABS: 25.0000 mg | ORAL_TABLET | Freq: Every evening | ORAL | Status: DC | PRN

## 2020-12-27 MED ORDER — SODIUM CHLORIDE 0.9% FLUSH: 3.0000 mL | INTRAVENOUS | Status: DC | PRN

## 2020-12-27 MED ORDER — PREDNISONE 20 MG PO TABS: 40.0000 mg | ORAL_TABLET | Freq: Every day | ORAL | Status: DC

## 2020-12-27 MED ORDER — INSULIN GLARGINE 100 UNIT/ML ~~LOC~~ SOLN
64.0000 [IU] | Freq: Every day | SUBCUTANEOUS | Status: DC
  Administered 2020-12-27: 64 [IU] via SUBCUTANEOUS
  Filled 2020-12-27 (×2): qty 0.64

## 2020-12-27 MED ORDER — KETOCONAZOLE 2 % EX CREA
TOPICAL_CREAM | Freq: Every day | CUTANEOUS | Status: DC | PRN
  Filled 2020-12-27: qty 15

## 2020-12-27 MED ORDER — PAROXETINE HCL 20 MG PO TABS
40.0000 mg | ORAL_TABLET | Freq: Every day | ORAL | Status: DC
  Administered 2020-12-27: 40 mg via ORAL
  Filled 2020-12-27 (×2): qty 2

## 2020-12-27 NOTE — Progress Notes (Signed)
Patient placed in observation after midnight, please see H&P.  Here after a fall.  [facility]Wait PT Eval.  Monitor Hgb.  On O2 here but states she is not on at home. Marlin Canary DO

## 2020-12-27 NOTE — ED Notes (Signed)
Pt placed on purwick at this time.

## 2020-12-27 NOTE — ED Notes (Signed)
Cleaned by RN, linens changed. Pt on purewick. Denies further needs.

## 2020-12-27 NOTE — ED Provider Notes (Signed)
[facility] Emergency Department Provider Note   ____________________________________________   Event Date/Time   First MD Initiated Contact with Patient 12/27/20 [phone]     (approximate)  I have reviewed the triage vital signs and the nursing notes.   HISTORY  Chief Complaint Fall    HPI Cheyenne Frank is a 80 y.o. female brought to the ED via EMS from home status post fall.  Patient fell asleep while on the toilet and fell face forward.  Takes Eliquis.  States the fall woke her up.  Presents with hematoma to left lower lip and chin, right knee pain and left wrist pain with bruising.  Denies vision changes, neck pain, chest pain, shortness of breath, abdominal pain, nausea, vomiting or dizziness.     Past Medical History:  Diagnosis Date  . Anemia of chronic disease   . Anxiety   . Arthritis   . Asthma    no problems since 1999 (after moving into a new home)  . Chronic airway obstruction (HCC)   . Complication of anesthesia    " feel like I'm dying after I wake up"  . Diabetes mellitus without complication (HCC)   . Diverticulosis   . Dysrhythmia    unknown type   . Fibromyalgia   . Flat back syndrome   . GERD (gastroesophageal reflux disease)   . Hx of transfusion 2011  . Hypertension   . Itching   . Macular degeneration   . Nocturia   . Restless leg syndrome   . Sleep apnea    Dx 1990's unable to wear c-pap -  . Stroke (HCC) 2010   verbal aphasia x 30 min - resolved - no problem since then    Patient Active Problem List   Diagnosis Date Noted  . Head injury 12/27/2020  . SDH (subdural hematoma) (HCC) 12/11/2013  . Postoperative wound infection--lumbar spine/2015 12/11/2013  . CVA (cerebral infarction) 12/11/2013  . Postoperative anemia due to acute blood loss 04/09/2013  . Diabetes (HCC) 04/07/2013  . Hypertension 04/07/2013  . Unspecified sleep apnea 04/07/2013  . OA (osteoarthritis) of knee 04/06/2013    Past Surgical  History:  Procedure Laterality Date  . ABDOMINAL HYSTERECTOMY     rt so  . BACK SURGERY  2010 / 2011  . BREAST REDUCTION SURGERY    . BURR HOLE FOR SUBDURAL HEMATOMA  2015  . cataracts removed    . CHOLECYSTECTOMY  1990  . DILATION AND CURETTAGE OF UTERUS    . HERNIA REPAIR    . POSTERIOR LAMINECTOMY / DECOMPRESSION LUMBAR SPINE     with T10-S1 fusion  . TONSILLECTOMY    . TOTAL KNEE ARTHROPLASTY Left 04/06/2013   Procedure: LEFT TOTAL KNEE ARTHROPLASTY;  Surgeon: Loanne Drilling, MD;  Location: [facility] ORS;  Service: Orthopedics;  Laterality: Left;    Prior to Admission medications   Medication Sig Start Date End Date Taking? Authorizing Provider  acetaminophen (TYLENOL) 500 MG tablet Take 500-1,000 mg by mouth every 6 (six) hours as needed for mild pain or fever.    Yes [provider]  albuterol (VENTOLIN HFA) 108 (90 Base) MCG/ACT inhaler albuterol sulfate HFA 90 mcg/actuation aerosol inhaler  INHALE 2 PUFFS INTO LUNGS 4 TIMES DAILY 05/19/20  Yes [provider]  aspirin EC 81 MG tablet Take 81 mg by mouth daily.   Yes [provider]  carvedilol (COREG) 25 MG tablet Take 25 mg by mouth 2 (two) times daily.  Yes [provider]  doxycycline (VIBRA-TABS) 100 MG tablet Take 100 mg by mouth 2 (two) times daily. 12/21/20  Yes [provider]  ELIQUIS 5 MG TABS tablet Take 5 mg by mouth 2 (two) times daily. 10/27/19  Yes [provider]  furosemide (LASIX) 20 MG tablet Take 20 mg by mouth daily.    Yes [provider]  gabapentin (NEURONTIN) 300 MG capsule Take 300 mg by mouth 2 (two) times daily.    Yes [provider]  insulin glargine (LANTUS) 100 UNIT/ML injection Inject 64 Units into the skin at bedtime.    Yes [provider]  insulin lispro (HUMALOG) 100 UNIT/ML injection Inject 22 Units into the skin 3 (three) times daily with meals.   Yes [provider]  ketoconazole (NIZORAL) 2 % cream Apply  topically daily. 12/21/20  Yes [provider]  losartan (COZAAR) 50 MG tablet Take 50 mg by mouth daily. 09/27/19  Yes [provider]  Multiple Vitamin (MULTIVITAMIN WITH MINERALS) TABS tablet Take 1 tablet by mouth daily.   Yes [provider]  multivitamin (RENA-VIT) TABS tablet Take 1 tablet by mouth daily.   Yes [provider]  nystatin cream (MYCOSTATIN) Apply topically 2 (two) times daily. 12/19/20  Yes [provider]  NYSTATIN powder SMARTSIG:Packet(s) Topical Twice Daily 12/21/20  Yes [provider]  omeprazole (PRILOSEC) 40 MG capsule Take 40 mg by mouth daily.    Yes [provider]  PARoxetine (PAXIL) 40 MG tablet Take 40 mg by mouth at bedtime.    Yes [provider]  rosuvastatin (CRESTOR) 5 MG tablet Take 2.5 mg by mouth at bedtime.   Yes [provider]  sitaGLIPtin (JANUVIA) 50 MG tablet Take 50 mg by mouth daily.   Yes [provider]  traZODone (DESYREL) 50 MG tablet Take 50 mg by mouth at bedtime.    Yes [provider]  umeclidinium bromide (INCRUSE ELLIPTA) 62.5 MCG/INH AEPB Inhale 1 puff into the lungs. 05/19/20  Yes [provider]    Allergies Codeine, Dilaudid [hydromorphone], Demerol [meperidine], Pravastatin, Lipitor [atorvastatin], Nifedipine, Statins, Sulfa antibiotics, and Vancomycin  Family History  Problem Relation Age of Onset  . COPD Sister        had lung transplant  . COPD Brother     Social History Social History   Tobacco Use  . Smoking status: Never Smoker  . Smokeless tobacco: Never Used  Substance Use Topics  . Alcohol use: No    Alcohol/week: 0.0 standard drinks  . Drug use: No    Review of Systems  Constitutional: No fever/chills Eyes: No visual changes. ENT: Positive for left lower lip and chin hematoma.  No sore throat. Cardiovascular: Denies chest pain. Respiratory: Denies shortness of breath. Gastrointestinal: No abdominal  pain.  No nausea, no vomiting.  No diarrhea.  No constipation. Genitourinary: Negative for dysuria. Musculoskeletal: Positive for left wrist and right knee pain.  Negative for back pain. Skin: Negative for rash. Neurological: Negative for headaches, focal weakness or numbness.   ____________________________________________   PHYSICAL EXAM:  VITAL SIGNS: ED Triage Vitals  Enc Vitals Group     BP 12/27/20 0239 (!) 154/66     Pulse Rate 12/27/20 0239 68     Resp 12/27/20 0239 18     Temp 12/27/20 0239 98.4 F (36.9 C)     Temp Source 12/27/20 0239 Oral     SpO2 12/27/20 0239 95 %     Weight 12/27/20  0236 240 lb (108.9 kg)     Height 12/27/20 0236 5\' 2"  (1.575 m)     Head Circumference --      Peak Flow --      Pain Score 12/27/20 0235 10     Pain Loc --      Pain Edu? --      Excl. in GC? --     Constitutional: Alert and oriented.  Elderly appearing and in mild acute distress. Eyes: Conjunctivae are normal. PERRL. EOMI. Head: Atraumatic. Nose: Atraumatic. Mouth/Throat: Mucous membranes are moist.  Moderate hematoma to left lower lip and chin.  Muffled voice secondary to hematoma.  There is no posterior oropharyngeal swelling.  No dental malocclusion.  Chin   Neck: No stridor.  No hematoma.  No cervical spine tenderness to palpation. Cardiovascular: Normal rate, regular rhythm. Grossly normal heart sounds.  Good peripheral circulation. Respiratory: Normal respiratory effort.  No retractions. Lungs CTAB. Gastrointestinal: Soft and nontender to light or deep palpation. No distention. No abdominal bruits. No CVA tenderness. Musculoskeletal:  Left dorsal wrist ecchymosis and pain.  Limited range of motion secondary to pain.  2+ radial pulse.  Brisk, less than 5-second capillary refill. Right anterior knee tender to palpation without evidence of external injury.  Full range of motion without pain.  2+ distal pulses.  Brisk, less than 5-second capillary refill.   Neurologic:   Normal speech and language. No gross focal neurologic deficits are appreciated.  Skin:  Skin is warm, dry and intact. No rash noted. Psychiatric: Mood and affect are normal. Speech and behavior are normal.  ____________________________________________   LABS (all labs ordered are listed, but only abnormal results are displayed)  Labs Reviewed  CBC WITH DIFFERENTIAL/PLATELET - Abnormal; Notable for the following components:      Result Value   Platelets 139 (*)    All other components within normal limits  COMPREHENSIVE METABOLIC PANEL - Abnormal; Notable for the following components:   Glucose, Bld 155 (*)    BUN 34 (*)    Creatinine, Ser 1.17 (*)    Calcium 8.8 (*)    Total Protein 6.2 (*)    Albumin 3.2 (*)    GFR, Estimated 47 (*)    All other components within normal limits  RESP PANEL BY RT-PCR (FLU A&B, COVID) ARPGX2  PROTIME-INR  TROPONIN I (HIGH SENSITIVITY)  TROPONIN I (HIGH SENSITIVITY)   ____________________________________________  EKG  ED ECG REPORT I, Analese Sovine J, the attending physician, personally viewed and interpreted this ECG.   Date: 12/27/2020  EKG Time: 0657  Rate: 69  Rhythm: normal EKG, normal sinus rhythm  Axis: Normal   Intervals:none  ST&T Change: Nonspecific  ____________________________________________  RADIOLOGY I, Lovely Kerins J, personally viewed and evaluated these images (plain radiographs) as part of my medical decision making, as well as reviewing the written report by the radiologist.  ED MD interpretation: Chronic lung markings on chest x-ray with mild right tracheal deviation which is mildly increased from old CT scan (this is secondary to mass-effect from brachiocephalic artery positioning), left wrist unremarkable, CT head negative for ICH, right knee negative, CT maxillofacial without fracture; notable soft tissue hematoma  Official radiology report(s): DG Chest 1 View  Addendum Date: 12/27/2020   ADDENDUM REPORT: 12/27/2020  03:41 ADDENDUM: There is deviation of the trachea to the right which is mildly increased in severity when compared to the prior study and secondary to mass effect from positioning of the brachiocephalic artery (seen on prior chest CT, dated  July 14, 2020). Electronically Signed   By: Aram Candela M.D.   On: 12/27/2020 03:41   Result Date: 12/27/2020 CLINICAL DATA:  Status post fall. EXAM: CHEST  1 VIEW COMPARISON:  July 14, 2020 FINDINGS: The lungs are hyperinflated. Mild, chronic appearing increased lung markings are seen. There is no evidence of acute infiltrate, pleural effusion or pneumothorax. The heart size and mediastinal contours are within normal limits. A chronic eighth right rib fracture is seen. Degenerative changes are seen throughout the thoracic spine with bilateral radiopaque pedicle screws noted within the lower thoracic and upper lumbar spine. IMPRESSION: Chronic appearing increased lung markings without acute or active cardiopulmonary disease. Electronically Signed: By: Aram Candela M.D. On: 12/27/2020 03:33   DG Wrist Complete Left  Result Date: 12/27/2020 CLINICAL DATA:  Status post fall. EXAM: LEFT WRIST - COMPLETE 3+ VIEW COMPARISON:  None. FINDINGS: There is no evidence of fracture or dislocation. Moderate to marked severity degenerative changes seen along the lateral aspect of the left wrist. This is most prominent along the carpometacarpal articulation of the left thumb. Soft tissues are unremarkable. IMPRESSION: Degenerative changes, without an acute osseous abnormality. Electronically Signed   By: Aram Candela M.D.   On: 12/27/2020 03:35   CT Head Wo Contrast  Result Date: 12/27/2020 CLINICAL DATA:  80 year old female status post fall face 1st off of toilet. Hematoma. On blood thinners. EXAM: CT HEAD WITHOUT CONTRAST TECHNIQUE: Contiguous axial images were obtained from the base of the skull through the vertex without intravenous contrast. COMPARISON:  Head  CT 07/10/2020.  Face CT today reported separately. FINDINGS: Brain: Stable cerebral volume since last year. No midline shift, ventriculomegaly, mass effect, evidence of mass lesion, intracranial hemorrhage or evidence of cortically based acute infarction. Chronic lacunar infarct/cystic encephalomalacia at the left cauda thalamic groove. Stable and largely normal for age gray-white matter differentiation elsewhere. No cortical encephalomalacia identified. Vascular: Calcified atherosclerosis at the skull base. No suspicious intracranial vascular hyperdensity. Skull: Chronic left superior frontal convexity burr hole. No skull fracture identified. Sinuses/Orbits: Visualized paranasal sinuses and mastoids are stable and well aerated. Other: No acute orbit or scalp soft tissue injury identified. IMPRESSION: 1. No acute intracranial abnormality. No acute traumatic injury identified. 2. Chronic left thalamic lacunar infarct. Electronically Signed   By: Odessa Fleming M.D.   On: 12/27/2020 04:47   DG Knee Complete 4 Views Right  Result Date: 12/27/2020 CLINICAL DATA:  Status post fall. EXAM: RIGHT KNEE - COMPLETE 4+ VIEW COMPARISON:  None. FINDINGS: No evidence of an acute fracture or dislocation. Moderate to marked severity medial and lateral tibiofemoral compartment space narrowing is seen. Patellofemoral narrowing is also noted. A small joint effusion is seen. IMPRESSION: 1. Moderate to marked severity degenerative changes with a small joint effusion. Electronically Signed   By: Aram Candela M.D.   On: 12/27/2020 03:34   CT Maxillofacial WO CM  Result Date: 12/27/2020 CLINICAL DATA:  80 year old female status post fall face 1st off of toilet. Hematoma. On blood thinners. EXAM: CT MAXILLOFACIAL WITHOUT CONTRAST TECHNIQUE: Multidetector CT imaging of the maxillofacial structures was performed. Multiplanar CT image reconstructions were also generated. COMPARISON:  Head CT today and earlier. FINDINGS: Osseous: Mandible  intact. Bilateral chronic TMJ degeneration. No acute dental finding identified. No maxilla or zygoma fracture. No acute nasal bone fracture. Pterygoid plates and central skull base appear intact. Advanced degenerative changes in the visible cervical spine, but visible cervical vertebrae appear intact. Intermittent posterior element degeneration and ankylosis noted bilaterally. Visible  calvarium intact. Orbits: Intact orbital walls. Postoperative changes to both globes. Orbits soft tissues appears symmetric and negative. Sinuses: Clear.  Tympanic cavities and mastoids are clear. Soft tissues: Superficial soft tissue hematoma overlying the left mandible measures up to 2.6 cm in thickness. Regional soft tissue stranding. No soft tissue gas. Sublingual, submandibular and masticator spaces are spared. Negative visible other noncontrast deep soft tissue spaces of the face, neck. No upper cervical lymphadenopathy. Limited intracranial: Stable to the head CT reported separately today. IMPRESSION: 1. Bulky superficial soft tissue hematoma overlying the left mandible which remains intact. 2. No facial fracture or other acute traumatic injury identified. 3. 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 Electronically Signed   By: Odessa Fleming M.D.   On: 12/27/2020 04:51    ____________________________________________   PROCEDURES  Procedure(s) performed (including Critical Care):  .1-3 Lead EKG Interpretation Performed by: Irean Hong, MD Authorized by: Irean Hong, MD     Interpretation: normal     ECG rate:  68   ECG rate assessment: normal     Rhythm: sinus rhythm     Ectopy: none     Conduction: normal   Comments:     Patient placed on cardiac monitor to evaluate for arrhythmias    CRITICAL CARE Performed by: Irean Hong   Total critical care time: 30 minutes  Critical care time was exclusive of separately billable procedures and treating other patients.  Critical care was  necessary to treat or prevent imminent or life-threatening deterioration.  Critical care was time spent personally by me on the following activities: development of treatment plan with patient and/or surrogate as well as nursing, discussions with consultants, evaluation of patient's response to treatment, examination of patient, obtaining history from patient or surrogate, ordering and performing treatments and interventions, ordering and review of laboratory studies, ordering and review of radiographic studies, pulse oximetry and re-evaluation of patient's condition.  ____________________________________________   INITIAL IMPRESSION / ASSESSMENT AND PLAN / ED COURSE  As part of my medical decision making, I reviewed the following data within the electronic MEDICAL RECORD NUMBER Nursing notes reviewed and incorporated, Labs reviewed, EKG interpreted, Old chart reviewed, Radiograph reviewed and Notes from prior ED visits     80 year old female presenting with left lower lip/chin hematoma status post mechanical fall from toilet, on Eliquis.  Differential diagnosis includes but is not limited to ICH, SDH, maxillofacial fracture, etc.  We will obtain lab work, imaging studies.  Yankauer suction provided at bedside for patient to use as needed.  Clinical Course as of 12/27/20 0709  Tue Dec 27, 2020  0501 Updated patient and family on all test results.  There has been no significant worsening of facial hematoma.  Given that patient is on Eliquis, will discuss with hospital services for admission for observation. [JS]    Clinical Course User Index [JS] Irean Hong, MD     ____________________________________________   FINAL CLINICAL IMPRESSION(S) / ED DIAGNOSES  Final diagnoses:  Fall, initial encounter  Facial hematoma, initial encounter  Injury of head, initial encounter     ED Discharge Orders    None      *Please note:  Cheyenne Frank was evaluated in Emergency Department on  12/27/2020 for the symptoms described in the history of present illness. She was evaluated in the context of the global COVID-19 pandemic, which necessitated consideration that the patient might be at risk for infection with the SARS-CoV-2 virus that causes COVID-19. Institutional protocols and  algorithms that pertain to the evaluation of patients at risk for COVID-19 are in a state of rapid change based on information released by regulatory bodies including the CDC and federal and state organizations. These policies and algorithms were followed during the patient's care in the ED.  Some ED evaluations and interventions may be delayed as a result of limited staffing during and the pandemic.*   Note:  This document was prepared using Dragon voice recognition software and may include unintentional dictation errors.   Irean Hong, MD 12/27/20 [phone]

## 2020-12-27 NOTE — ED Notes (Signed)
Report to Liz, RN

## 2020-12-27 NOTE — ED Notes (Signed)
Pt in ct 

## 2020-12-27 NOTE — Evaluation (Signed)
Physical Therapy Evaluation Patient Details Name: Cheyenne Frank MRN: [id number] DOB: 08/25/1941 Today's Date: 12/27/2020   History of Present Illness  80 y.o. Caucasian female with medical history significant for multiple medical problems including type diabetes mellitus, asthma, proximal atrial fibrillation on Eliquis, GERD, macular degeneration, restless leg syndrome, sleep apnea, unable to wear CPAP, CVA and diverticulosis, who presented to the ER with fall and subsequent head trauma.  The patient fell asleep apparently on the toilet (she reports she falls asleep regularly because she is up every ~30 minutes to pee t/o the night).  Now with acute left lower lip and left cheek contusion and hematoma.  Clinical Impression  Pt did well with PT exam and though she does not normally need O2 she did require 2L to stay above 92% during activity.  Pt reports that she normally needs assist to get supine to sitting EOB, needed this today.  She also reports that she has been using a standard (no wheeled) walker around the home, she did very well with the FWW here in the hospital and reports how much faster and smoother (aka better) her gait was with the RW.  Good effort, pt has help 24/7 in the home.  She is safe to d/c home, recommending HHPT and FWW.     Follow Up Recommendations Home health PT    Equipment Recommendations  Rolling walker with 5" wheels    Recommendations for Other Services       Precautions / Restrictions Precautions Precautions: Fall Restrictions Weight Bearing Restrictions: No      Mobility  Bed Mobility Overal bed mobility: Needs Assistance Bed Mobility: Supine to Sit     Supine to sit: Mod assist     General bed mobility comments: Pt showed good effort in getting toward EOB and initiating movement to sitting, but needed considerable assist to actual elevate torso up to sitting    Transfers Overall transfer level: Needs assistance Equipment used: Rolling walker  (2 wheeled) Transfers: Sit to/from Stand Sit to Stand: Min assist         General transfer comment: Pt gave good effort but did ultimately need light assist to attains standing, confident once upright  Ambulation/Gait Ambulation/Gait assistance: Supervision Gait Distance (Feet): 45 Feet Assistive device: Rolling walker (2 wheeled)       General Gait Details: Pt did well with ambulation, reports walking fast and more confidently than at her baseline.  (apparently has been using a standard walker in the home).  She did not have any LOBs/safety issues and though she had some fatigue her vitals remained stable t/o the effort.  Stairs            Wheelchair Mobility    Modified Rankin (Stroke Patients Only)       Balance Overall balance assessment: Needs assistance Sitting-balance support: No upper extremity supported Sitting balance-Leahy Scale: Good     Standing balance support: Bilateral upper extremity supported Standing balance-Leahy Scale: Fair Standing balance comment: Pt needing UEs to maintain balance in standing, but no LOBs or overt safety issues with the AD                             Pertinent Vitals/Pain Pain Assessment: 0-10 Pain Score: 3  Pain Location: points to facial bruising    Home Living Family/patient expects to be discharged to:: Private residence Living Arrangements: Children Available Help at Discharge: Family;Available 24 hours/day (daughter home t/o the  day)   Home Access: Stairs to enter Entrance Stairs-Rails: Right Entrance Stairs-Number of Steps: 5   Home Equipment: Walker - standard      Prior Function Level of Independence: Needs assistance   Gait / Transfers Assistance Needed: Pt state she is only out of the house for MD appointments, can go room to room in the home w/ walker.  Needs assist each morning to get up out of bed.  ADL's / Homemaking Assistance Needed: Daughter helps with dressing, cooking, bathing         Hand Dominance        Extremity/Trunk Assessment   Upper Extremity Assessment Upper Extremity Assessment: Generalized weakness    Lower Extremity Assessment Lower Extremity Assessment: Generalized weakness (L slightly weaker than R but functional)       Communication   Communication: No difficulties  Cognition Arousal/Alertness: Awake/alert Behavior During Therapy: WFL for tasks assessed/performed Overall Cognitive Status: Within Functional Limits for tasks assessed                                        General Comments      Exercises     Assessment/Plan    PT Assessment Patient needs continued PT services  PT Problem List Decreased strength;Decreased range of motion;Decreased activity tolerance;Decreased mobility;Decreased balance;Decreased coordination;Decreased knowledge of use of DME;Decreased safety awareness;Cardiopulmonary status limiting activity;Pain       PT Treatment Interventions DME instruction;Stair training;Gait training;Functional mobility training;Therapeutic activities;Therapeutic exercise;Balance training;Patient/family education;Cognitive remediation    PT Goals (Current goals can be found in the Care Plan section)  Acute Rehab PT Goals Patient Stated Goal: Go home PT Goal Formulation: With patient Time For Goal Achievement: 01/10/21 Potential to Achieve Goals: Good    Frequency Min 2X/week   Barriers to discharge        Co-evaluation               AM-PAC PT "6 Clicks" Mobility  Outcome Measure Help needed turning from your back to your side while in a flat bed without using bedrails?: A Little Help needed moving from lying on your back to sitting on the side of a flat bed without using bedrails?: A Lot Help needed moving to and from a bed to a chair (including a wheelchair)?: A Little Help needed standing up from a chair using your arms (e.g., wheelchair or bedside chair)?: A Little Help needed to walk in  hospital room?: A Little Help needed climbing 3-5 steps with a railing? : A Lot 6 Click Score: 16    End of Session Equipment Utilized During Treatment: Gait belt;Oxygen (2L) Activity Tolerance: Patient limited by fatigue;Patient tolerated treatment well Patient left: with chair alarm set;with call bell/phone within reach   PT Visit Diagnosis: Muscle weakness (generalized) (M62.81);Difficulty in walking, not elsewhere classified (R26.2);Unsteadiness on feet (R26.81)    Time: 6761-9509 PT Time Calculation (min) (ACUTE ONLY): 34 min   Charges:   PT Evaluation $PT Eval Low Complexity: 1 Low PT Treatments $Gait Training: 8-22 mins        Malachi Pro, DPT 12/27/2020, 3:51 PM

## 2020-12-27 NOTE — ED Notes (Signed)
Pt placed on 3l at this time

## 2020-12-27 NOTE — ED Notes (Signed)
Patient has not been able to have bowel movment. Tried on bedpan.  She also sounds wheezy.  Says she does have inhalers at home-long and short acting.  No distress.

## 2020-12-27 NOTE — ED Triage Notes (Signed)
Pt to ED via EMS from home. Pt fell asleep on toilet and fell face first off of toilet. Pt has hematoma to lower left lip, c/o bilateral knee pain and left wrist pain with bruising. Pt is on blood thinners. Pt is at baseline per ems.

## 2020-12-27 NOTE — Plan of Care (Signed)
Patient alert and oriented and oriented to room and callbell.  Bed alarm on.

## 2020-12-27 NOTE — ED Notes (Signed)
Pt clean and dry. Watching TV. Denies further needs. Awaiting breakfast.

## 2020-12-27 NOTE — H&P (Signed)
[facility]   PATIENT NAME: Cheyenne Frank    MR#:  [id number]  DATE OF BIRTH:  [birth year]  DATE OF ADMISSION:  12/27/2020  PRIMARY CARE PHYSICIAN: [facility]   Patient is coming from: Home  REQUESTING/REFERRING PHYSICIAN: Chiquita Loth, MD  CHIEF COMPLAINT:   Chief Complaint  Patient presents with  . Fall    HISTORY OF PRESENT ILLNESS:  Cheyenne Frank is a 80 y.o. Caucasian female with medical history significant for multiple medical problems that are mentioned below including type diabetes mellitus, asthma, proximal atrial fibrillation on Eliquis, GERD, macular degeneration, restless leg syndrome, sleep apnea, unable to wear CPAP, CVA and diverticulosis, who presented to the ER with acute onset of fall with subsequent head trauma.  The patient fell asleep apparently on the toilet with subsequent fall that woke her up.  She had a subsequent left lower lip and left cheek contusion and hematoma.  She denies any headache or dizziness or blurred vision.  No paresthesias or focal muscle weakness.  No dysphagia or dysarthria, tinnitus or vertigo.  No chest pain or palpitations.  No nausea or vomiting or abdominal pain.  She denies any cough or wheezing or dyspnea.  The patient had no other significant injuries except for right knee contusion and left wrist contusion.  She was able to move both.  ED Course: Upon presentation to the ER blood pressure was 154/66 with otherwise normal vital signs.  Labs revealed glucose of 155 and creatinine 1.17 with BUN of 34 and albumin 3.2.  CBC was unremarkable except for mild thrombocytopenia of 139.  Follows antigens and COVID-19 PCR came back negative.  Imaging: Noncontrasted CT scan revealed old left thalamic lacunar infarct with no acute abnormalities.   Maxillofacial CT showed the following: 1. Bulky superficial soft tissue hematoma overlying the left mandible which remains intact. 2. No facial fracture or other acute  traumatic injury identified. 3. 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  The patient was given cold compresses for her left lower lip contusion.  She will be admitted on observation medical bed for further evaluation and monitoring. PAST MEDICAL HISTORY:   Past Medical History:  Diagnosis Date  . Anemia of chronic disease   . Anxiety   . Arthritis   . Asthma    no problems since 1999 (after moving into a new home)  . Chronic airway obstruction (HCC)   . Complication of anesthesia    " feel like I'm dying after I wake up"  . Diabetes mellitus without complication (HCC)   . Diverticulosis   . Dysrhythmia    unknown type   . Fibromyalgia   . Flat back syndrome   . GERD (gastroesophageal reflux disease)   . Hx of transfusion 2011  . Hypertension   . Itching   . Macular degeneration   . Nocturia   . Restless leg syndrome   . Sleep apnea    Dx 1990's unable to wear c-pap -  . Stroke (HCC) 2010   verbal aphasia x 30 min - resolved - no problem since then    PAST SURGICAL HISTORY:   Past Surgical History:  Procedure Laterality Date  . ABDOMINAL HYSTERECTOMY     rt so  . BACK SURGERY  2010 / 2011  . BREAST REDUCTION SURGERY    . BURR HOLE FOR SUBDURAL HEMATOMA  2015  . cataracts removed    . CHOLECYSTECTOMY  1990  . DILATION AND CURETTAGE OF  UTERUS    . HERNIA REPAIR    . POSTERIOR LAMINECTOMY / DECOMPRESSION LUMBAR SPINE     with T10-S1 fusion  . TONSILLECTOMY    . TOTAL KNEE ARTHROPLASTY Left 04/06/2013   Procedure: LEFT TOTAL KNEE ARTHROPLASTY;  Surgeon: Loanne Drilling, MD;  Location: [facility];  Service: Orthopedics;  Laterality: Left;    SOCIAL HISTORY:   Social History   Tobacco Use  . Smoking status: Never Smoker  . Smokeless tobacco: Never Used  Substance Use Topics  . Alcohol use: No    Alcohol/week: 0.0 standard drinks    FAMILY HISTORY:   Family History  Problem Relation Age of Onset  . COPD Sister        had lung transplant  .  COPD Brother     DRUG ALLERGIES:   Allergies  Allergen Reactions  . Codeine Palpitations    Other Reaction: RACES HEART  . Dilaudid [Hydromorphone] Other (See Comments) and Rash    Other Reaction: lightheaded, altered ms, nervous, Paranoia paranoid Other Reaction: lightheaded, altered ms, nervous, Paranoia  . Demerol [Meperidine] Other (See Comments) and Rash    agitated Agitation  . Pravastatin Other (See Comments)    Muscle aches  . Lipitor [Atorvastatin] Other (See Comments) and Itching    unknown  . Nifedipine Other (See Comments)    unknown  . Statins Rash    Makes her crazy  . Sulfa Antibiotics Rash  . Vancomycin Rash    deafness    REVIEW OF SYSTEMS:   ROS As per history of present illness. All pertinent systems were reviewed above. Constitutional, HEENT, cardiovascular, respiratory, GI, GU, musculoskeletal, neuro, psychiatric, endocrine, integumentary and hematologic systems were reviewed and are otherwise negative/unremarkable except for positive findings mentioned above in the HPI.   MEDICATIONS AT HOME:   Prior to Admission medications   Medication Sig Start Date End Date Taking? Authorizing Provider  acetaminophen (TYLENOL) 500 MG tablet Take 500-1,000 mg by mouth every 6 (six) hours as needed for mild pain or fever.    Yes [provider]  albuterol (VENTOLIN HFA) 108 (90 Base) MCG/ACT inhaler albuterol sulfate HFA 90 mcg/actuation aerosol inhaler  INHALE 2 PUFFS INTO LUNGS 4 TIMES DAILY 05/19/20  Yes [provider]  aspirin EC 81 MG tablet Take 81 mg by mouth daily.   Yes [provider]  carvedilol (COREG) 25 MG tablet Take 25 mg by mouth 2 (two) times daily.    Yes [provider]  doxycycline (VIBRA-TABS) 100 MG tablet Take 100 mg by mouth 2 (two) times daily. 12/21/20  Yes [provider]  ELIQUIS 5 MG TABS tablet Take 5 mg by mouth 2 (two) times daily. 10/27/19  Yes [provider]  furosemide (LASIX) 20  MG tablet Take 20 mg by mouth daily.    Yes [provider]  gabapentin (NEURONTIN) 300 MG capsule Take 300 mg by mouth 2 (two) times daily.    Yes [provider]  insulin glargine (LANTUS) 100 UNIT/ML injection Inject 64 Units into the skin at bedtime.    Yes [provider]  insulin lispro (HUMALOG) 100 UNIT/ML injection Inject 22 Units into the skin 3 (three) times daily with meals.   Yes [provider]  ketoconazole (NIZORAL) 2 % cream Apply topically daily. 12/21/20  Yes [provider]  losartan (COZAAR) 50 MG tablet Take 50 mg by mouth daily. 09/27/19  Yes [provider]  Multiple Vitamin (MULTIVITAMIN WITH MINERALS) TABS tablet Take  1 tablet by mouth daily.   Yes [provider]  multivitamin (RENA-VIT) TABS tablet Take 1 tablet by mouth daily.   Yes [provider]  nystatin cream (MYCOSTATIN) Apply topically 2 (two) times daily. 12/19/20  Yes [provider]  NYSTATIN powder SMARTSIG:Packet(s) Topical Twice Daily 12/21/20  Yes [provider]  omeprazole (PRILOSEC) 40 MG capsule Take 40 mg by mouth daily.    Yes [provider]  PARoxetine (PAXIL) 40 MG tablet Take 40 mg by mouth at bedtime.    Yes [provider]  rosuvastatin (CRESTOR) 5 MG tablet Take 2.5 mg by mouth at bedtime.   Yes [provider]  sitaGLIPtin (JANUVIA) 50 MG tablet Take 50 mg by mouth daily.   Yes [provider]  traZODone (DESYREL) 50 MG tablet Take 50 mg by mouth at bedtime.    Yes [provider]  umeclidinium bromide (INCRUSE ELLIPTA) 62.5 MCG/INH AEPB Inhale 1 puff into the lungs. 05/19/20  Yes [provider]      VITAL SIGNS:  Blood pressure (!) 162/58, pulse 71, temperature 98.4 F (36.9 C), temperature source Oral, resp. rate 18, height 5\' 2"  (1.575 m), weight 108.9 kg, SpO2 94 %.  PHYSICAL EXAMINATION:  Physical Exam  GENERAL:  80 y.o.-year-old Caucasian female  patient lying in the bed with no acute distress.  EYES: Pupils equal, round, reactive to light and accommodation. No scleral icterus. Extraocular muscles intact.  HEENT: Head normocephalic with left lower lip significant hematoma as well as left cheek contusion and hematoma.. Oropharynx and nasopharynx clear.  NECK:  Supple, no jugular venous distention. No thyroid enlargement, no tenderness.  LUNGS: Normal breath sounds bilaterally, no wheezing, rales,rhonchi or crepitation. No use of accessory muscles of respiration.  CARDIOVASCULAR: Regular rate and rhythm, S1, S2 normal. No murmurs, rubs, or gallops.  ABDOMEN: Soft, nondistended, nontender. Bowel sounds present. No organomegaly or mass.  EXTREMITIES: No pedal edema, cyanosis, or clubbing.  NEUROLOGIC: Cranial nerves II through XII are intact. Muscle strength 5/5 in all extremities. Sensation intact. Gait not checked.  PSYCHIATRIC: The patient is alert and oriented x 3.  Normal affect and good eye contact. SKIN: As above with no otherwise obvious rash, lesion, or ulcer.   LABORATORY PANEL:   CBC Recent Labs  Lab 12/27/20 0241  WBC 6.5  HGB 12.5  HCT 39.3  PLT 139*   ------------------------------------------------------------------------------------------------------------------  Chemistries  Recent Labs  Lab 12/27/20 0241  NA 139  K 4.1  CL 100  CO2 29  GLUCOSE 155*  BUN 34*  CREATININE 1.17*  CALCIUM 8.8*  AST 22  ALT 18  ALKPHOS 83  BILITOT 0.5   ------------------------------------------------------------------------------------------------------------------  Cardiac Enzymes No results for input(s): TROPONINI in the last 168 hours. ------------------------------------------------------------------------------------------------------------------  RADIOLOGY:  DG Chest 1 View  Addendum Date: 12/27/2020   ADDENDUM REPORT: 12/27/2020 03:41 ADDENDUM: There is deviation of the trachea to the right which is mildly  increased in severity when compared to the prior study and secondary to mass effect from positioning of the brachiocephalic artery (seen on prior chest CT, dated July 14, 2020). Electronically Signed   By: July 16, 2020 M.D.   On: 12/27/2020 03:41   Result Date: 12/27/2020 CLINICAL DATA:  Status post fall. EXAM: CHEST  1 VIEW COMPARISON:  July 14, 2020 FINDINGS: The lungs are hyperinflated. Mild, chronic appearing increased lung markings are seen. There is no evidence of acute infiltrate, pleural effusion or pneumothorax. The heart size and mediastinal contours are within  normal limits. A chronic eighth right rib fracture is seen. Degenerative changes are seen throughout the thoracic spine with bilateral radiopaque pedicle screws noted within the lower thoracic and upper lumbar spine. IMPRESSION: Chronic appearing increased lung markings without acute or active cardiopulmonary disease. Electronically Signed: By: Aram Candelahaddeus  Houston M.D. On: 12/27/2020 03:33   DG Wrist Complete Left  Result Date: 12/27/2020 CLINICAL DATA:  Status post fall. EXAM: LEFT WRIST - COMPLETE 3+ VIEW COMPARISON:  None. FINDINGS: There is no evidence of fracture or dislocation. Moderate to marked severity degenerative changes seen along the lateral aspect of the left wrist. This is most prominent along the carpometacarpal articulation of the left thumb. Soft tissues are unremarkable. IMPRESSION: Degenerative changes, without an acute osseous abnormality. Electronically Signed   By: Aram Candelahaddeus  Houston M.D.   On: 12/27/2020 03:35   CT Head Wo Contrast  Result Date: 12/27/2020 CLINICAL DATA:  80 year old female status post fall face 1st off of toilet. Hematoma. On blood thinners. EXAM: CT HEAD WITHOUT CONTRAST TECHNIQUE: Contiguous axial images were obtained from the base of the skull through the vertex without intravenous contrast. COMPARISON:  Head CT 07/10/2020.  Face CT today reported separately. FINDINGS: Brain: Stable  cerebral volume since last year. No midline shift, ventriculomegaly, mass effect, evidence of mass lesion, intracranial hemorrhage or evidence of cortically based acute infarction. Chronic lacunar infarct/cystic encephalomalacia at the left cauda thalamic groove. Stable and largely normal for age gray-white matter differentiation elsewhere. No cortical encephalomalacia identified. Vascular: Calcified atherosclerosis at the skull base. No suspicious intracranial vascular hyperdensity. Skull: Chronic left superior frontal convexity burr hole. No skull fracture identified. Sinuses/Orbits: Visualized paranasal sinuses and mastoids are stable and well aerated. Other: No acute orbit or scalp soft tissue injury identified. IMPRESSION: 1. No acute intracranial abnormality. No acute traumatic injury identified. 2. Chronic left thalamic lacunar infarct. Electronically Signed   By: Odessa FlemingH  Hall M.D.   On: 12/27/2020 04:47   DG Knee Complete 4 Views Right  Result Date: 12/27/2020 CLINICAL DATA:  Status post fall. EXAM: RIGHT KNEE - COMPLETE 4+ VIEW COMPARISON:  None. FINDINGS: No evidence of an acute fracture or dislocation. Moderate to marked severity medial and lateral tibiofemoral compartment space narrowing is seen. Patellofemoral narrowing is also noted. A small joint effusion is seen. IMPRESSION: 1. Moderate to marked severity degenerative changes with a small joint effusion. Electronically Signed   By: Aram Candelahaddeus  Houston M.D.   On: 12/27/2020 03:34   CT Maxillofacial WO CM  Result Date: 12/27/2020 CLINICAL DATA:  80 year old female status post fall face 1st off of toilet. Hematoma. On blood thinners. EXAM: CT MAXILLOFACIAL WITHOUT CONTRAST TECHNIQUE: Multidetector CT imaging of the maxillofacial structures was performed. Multiplanar CT image reconstructions were also generated. COMPARISON:  Head CT today and earlier. FINDINGS: Osseous: Mandible intact. Bilateral chronic TMJ degeneration. No acute dental finding  identified. No maxilla or zygoma fracture. No acute nasal bone fracture. Pterygoid plates and central skull base appear intact. Advanced degenerative changes in the visible cervical spine, but visible cervical vertebrae appear intact. Intermittent posterior element degeneration and ankylosis noted bilaterally. Visible calvarium intact. Orbits: Intact orbital walls. Postoperative changes to both globes. Orbits soft tissues appears symmetric and negative. Sinuses: Clear.  Tympanic cavities and mastoids are clear. Soft tissues: Superficial soft tissue hematoma overlying the left mandible measures up to 2.6 cm in thickness. Regional soft tissue stranding. No soft tissue gas. Sublingual, submandibular and masticator spaces are spared. Negative visible other noncontrast deep soft tissue spaces of the face,  neck. No upper cervical lymphadenopathy. Limited intracranial: Stable to the head CT reported separately today. IMPRESSION: 1. Bulky superficial soft tissue hematoma overlying the left mandible which remains intact. 2. No facial fracture or other acute traumatic injury identified. 3. 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 Electronically Signed   By: Odessa Fleming M.D.   On: 12/27/2020 04:51      IMPRESSION AND PLAN:  Active Problems:   Head injury  1.  Accidental fall with subsequent facial injury and left lower lip and cheek contusion and significant hematoma on Eliquis. -The patient was admitted to an observation medical bed. -We will follow neuro checks every 4 hours for 24 hours. -Cold compresses will be utilized for her left lower lip. -She will be monitored for development of cellulitis. -Pain management will be provided. -We will hold off her Eliquis at this time.  2.  Essential hypertension. -We will continue her Coreg and Cozaar.  3.  Paroxysmal atrial fibrillation. -We will obtain EKG and continue her Coreg. -We will holding off her Eliquis given her lower lip hematoma.  4.   Type 2 diabetes mellitus with peripheral neuropathy. -We will her basal coverage and place her on supplement coverage with NovoLog as well as continue Januvia. -We will continue her Neurontin.  5.  Depression. -We will continue her Paxil.  6.  Dyslipidemia. -We will continue statin therapy.   DVT prophylaxis: SCDs.  Medical prophylaxis currently held off given her lip and facial hematoma. Code Status: full code. Family Communication:  The plan of care was discussed in details with the patient (and her daughter who was with her in the ER). I answered all questions. The patient agreed to proceed with the above mentioned plan. Further management will depend upon hospital course. Disposition Plan: Back to previous home environment Consults called: none. All the records are reviewed and case discussed with ED provider.  Status is: Observation  The patient remains OBS appropriate and will d/c before 2 midnights.  Dispo: The patient is from: Home              Anticipated d/c is to: Home              Patient currently is not medically stable to d/c.   Difficult to place patient No  TOTAL TIME TAKING CARE OF THIS PATIENT: 45 minutes.    Hannah Beat M.D on 12/27/2020 at 7:00 AM  [facility]   From 7 PM-7 AM, contact night-coverage [email or web address]  CC: Primary care physician; Terrence Dupont, [facility]

## 2020-12-28 DIAGNOSIS — S00531A Contusion of lip, initial encounter: Secondary | ICD-10-CM | POA: Diagnosis not present

## 2020-12-28 DIAGNOSIS — S0083XA Contusion of other part of head, initial encounter: Secondary | ICD-10-CM | POA: Diagnosis not present

## 2020-12-28 LAB — CBC
HCT: 37.2 % (ref 36.0–46.0)
Hemoglobin: 11.7 g/dL — ABNORMAL LOW (ref 12.0–15.0)
MCH: 29.3 pg (ref 26.0–34.0)
MCHC: 31.5 g/dL (ref 30.0–36.0)
MCV: 93 fL (ref 80.0–100.0)
Platelets: 122 10*3/uL — ABNORMAL LOW (ref 150–400)
RBC: 4 MIL/uL (ref 3.87–5.11)
RDW: 15.1 % (ref 11.5–15.5)
WBC: 5.5 10*3/uL (ref 4.0–10.5)
nRBC: 0 % (ref 0.0–0.2)

## 2020-12-28 LAB — BASIC METABOLIC PANEL
Anion gap: 8 (ref 5–15)
BUN: 26 mg/dL — ABNORMAL HIGH (ref 8–23)
CO2: 34 mmol/L — ABNORMAL HIGH (ref 22–32)
Calcium: 8.8 mg/dL — ABNORMAL LOW (ref 8.9–10.3)
Chloride: 100 mmol/L (ref 98–111)
Creatinine, Ser: 1.15 mg/dL — ABNORMAL HIGH (ref 0.44–1.00)
GFR, Estimated: 48 mL/min — ABNORMAL LOW (ref >60)
Glucose, Bld: 187 mg/dL — ABNORMAL HIGH (ref 70–99)
Potassium: 4.2 mmol/L (ref 3.5–5.1)
Sodium: 142 mmol/L (ref 135–145)

## 2020-12-28 LAB — GLUCOSE, CAPILLARY: Glucose-Capillary: 220 mg/dL — ABNORMAL HIGH (ref 70–99)

## 2020-12-28 MED ORDER — ELIQUIS 5 MG PO TABS: 5.0000 mg | ORAL_TABLET | Freq: Two times a day (BID) | ORAL | Status: DC

## 2020-12-28 MED ORDER — ASPIRIN EC 81 MG PO TBEC: 81.0000 mg | DELAYED_RELEASE_TABLET | Freq: Every day | ORAL | 11 refills | Status: DC

## 2020-12-28 NOTE — Discharge Instructions (Signed)
Facial or Scalp Contusion A facial or scalp contusion is a deep bruise (contusion) on the face or head. Bruises happen when an injury causes bleeding under the skin. The bruise may turn blue, purple, or yellow. Minor injuries will cause a bruise that is not painful, but worse bruises can stay painful and swollen for a few weeks. Injuries to the face and head usually cause a lot of swelling, especially around the eyes. What are the causes?  An injury to the face or head from an object.  A fall.  A hit to the face or head area. What are the signs or symptoms?  Swelling in the area of the injury.  The injured area being a different color from normal.  Pain or soreness in the injured area. How is this treated?  Applying cold compresses to the hurt area. This is often the best treatment.  Taking over-the-counter medicines to help take the pain away, if your doctor tells you to take them. Follow these instructions at home: Managing pain, stiffness, and swelling  If told, put ice on the injured area. To do this: ? Put ice in a plastic bag. ? Place a towel between your skin and the bag. ? Leave the ice on for 20 minutes, 2-3 times a day.  If possible, raise (elevate) your head above the level of your heart while you are lying down. This may help reduce swelling.   General instructions  Take over-the-counter and prescription medicines only as told by your doctor.  Rest as told by your doctor.  Return to your normal activities as told by your doctor. Ask your doctor what activities are safe for you.  Keep all follow-up visits as told by your doctor. This is important. Contact a doctor if:  You have trouble biting or chewing.  Your pain or swelling gets worse.  You have pain when you move your eyes. Get help right away if:  You have very bad pain or a headache, and medicine does not help.  You are very tired or confused.  Your personality changes.  You vomit.  You have a  nosebleed that does not stop.  You see two of everything (double vision) or have blurry vision.  You have clear fluid coming from your nose or ear, and it does not go away.  You have problems walking or using your arms or legs.  You feel very dizzy. Summary  A facial or scalp contusion is a deep bruise (contusion) on the face or head.  Bruises happen when an injury causes bleeding under the skin.  Minor injuries will cause a bruise that is not painful, but worse bruises can stay painful and swollen for a few weeks.  Applying cold compresses to the hurt area is often the best treatment. This information is not intended to replace advice given to you by your health care provider. Make sure you discuss any questions you have with your health care provider. Document Revised: 08/26/2019 Document Reviewed: 08/26/2019 Elsevier Patient Education  2021 [employer]    

## 2020-12-28 NOTE — TOC Initial Note (Signed)
Transition of Care [facility]) - Initial/Assessment Note    Patient Details  Name: Cheyenne Frank MRN: [id number] Date of Birth: December 01, 1940  [facility] CM/SW Contact:    Maree Krabbe, LCSW Phone Number: 12/28/2020, 8:46 AM  Clinical Narrative:   Pt is alert and oriented. Pt states she lives with her daughter and son in law. Pt is agreeable to [facility] and has no agency preference. [facility] will service--referral made to [facility]. Pt states she does not have a walker at home. Referral for walker sent to [facility] with [facility].                Expected Discharge Plan: Home w Home Health Services Barriers to Discharge: Continued Medical Work up   Patient Goals and CMS Choice Patient states their goals for this hospitalization and ongoing recovery are:: to go home   Choice offered to / list presented to : Patient  Expected Discharge Plan and Services Expected Discharge Plan: Home w Home Health Services In-house Referral: Clinical Social Work   Post Acute Care Choice: Home Health Living arrangements for the past 2 months: Single Family Home                 DME Arranged: Dan Humphreys DME Agency: [facility] Date DME Agency Contacted: 12/28/20 Time DME Agency Contacted: [phone] Representative spoke with at DME Agency: zach HH Arranged: PT,OT HH Agency: [facility] ([facility]) Date HH Agency Contacted: 12/28/20 Time HH Agency Contacted: 0845 Representative spoke with at [facility] Agency: Barbara Cower  Prior Living Arrangements/Services Living arrangements for the past 2 months: Single Family Home Lives with:: Adult Children Patient language and need for interpreter reviewed:: Yes Do you feel safe going back to the place where you live?: Yes      Need for Family Participation in Patient Care: Yes (Comment) Care giver support system in place?: Yes (comment)   Criminal Activity/Legal Involvement Pertinent to Current Situation/Hospitalization: No - Comment as needed  Activities of Daily  Living Home Assistive Devices/Equipment: None ADL Screening (condition at time of admission) Patient's cognitive ability adequate to safely complete daily activities?: Yes Is the patient deaf or have difficulty hearing?: Yes Does the patient have difficulty seeing, even when wearing glasses/contacts?: No Does the patient have difficulty concentrating, remembering, or making decisions?: No Patient able to express need for assistance with ADLs?: Yes Does the patient have difficulty dressing or bathing?: Yes Independently performs ADLs?: Yes (appropriate for developmental age) Does the patient have difficulty walking or climbing stairs?: Yes Weakness of Legs: Both Weakness of Arms/Hands: Both  Permission Sought/Granted Permission sought to share information with : Family Supports    Share Information with NAME: lisa     Permission granted to share info w Relationship: daughter     Emotional Assessment Appearance:: Appears stated age Attitude/Demeanor/Rapport: Engaged Affect (typically observed): Accepting Orientation: : Oriented to Situation,Oriented to  Time,Oriented to Place,Oriented to Self Alcohol / Substance Use: Not Applicable Psych Involvement: No (comment)  Admission diagnosis:  Head injury [S09.90XA] Injury of head, initial encounter [S09.90XA] Fall, initial encounter [W19.XXXA] Facial hematoma, initial encounter [S00.83XA] Patient Active Problem List   Diagnosis Date Noted  . Head injury 12/27/2020  . SDH (subdural hematoma) (HCC) 12/11/2013  . Postoperative wound infection--lumbar spine/2015 12/11/2013  . CVA (cerebral infarction) 12/11/2013  . Postoperative anemia due to acute blood loss 04/09/2013  . Diabetes (HCC) 04/07/2013  . Hypertension 04/07/2013  . Unspecified sleep apnea 04/07/2013  . 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

## 2020-12-28 NOTE — Plan of Care (Signed)
  Problem: Education: Goal: Knowledge of General Education information will improve Description: Including pain rating scale, medication(s)/side effects and non-pharmacologic comfort measures 12/28/2020 1024 by Carrington Clamp, RN Outcome: Adequate for Discharge 12/28/2020 0745 by Carrington Clamp, RN Outcome: Progressing   Problem: Health Behavior/Discharge Planning: Goal: Ability to manage health-related needs will improve 12/28/2020 1024 by Azriella Mattia, Gilford Rile, RN Outcome: Adequate for Discharge 12/28/2020 0745 by Carrington Clamp, RN Outcome: Progressing   Problem: Clinical Measurements: Goal: Ability to maintain clinical measurements within normal limits will improve 12/28/2020 1024 by Carrington Clamp, RN Outcome: Adequate for Discharge 12/28/2020 0745 by Carrington Clamp, RN Outcome: Progressing Goal: Will remain free from infection 12/28/2020 1024 by Carrington Clamp, RN Outcome: Adequate for Discharge 12/28/2020 0745 by Carrington Clamp, RN Outcome: Progressing Goal: Diagnostic test results will improve 12/28/2020 1024 by Carrington Clamp, RN Outcome: Adequate for Discharge 12/28/2020 0745 by Carrington Clamp, RN Outcome: Progressing Goal: Respiratory complications will improve 12/28/2020 1024 by Carrington Clamp, RN Outcome: Adequate for Discharge 12/28/2020 0745 by Carrington Clamp, RN Outcome: Progressing Goal: Cardiovascular complication will be avoided 12/28/2020 1024 by Carrington Clamp, RN Outcome: Adequate for Discharge 12/28/2020 0745 by Carrington Clamp, RN Outcome: Progressing   Problem: Activity: Goal: Risk for activity intolerance will decrease 12/28/2020 1024 by Carrington Clamp, RN Outcome: Adequate for Discharge 12/28/2020 0745 by Carrington Clamp, RN Outcome: Progressing   Problem: Nutrition: Goal: Adequate nutrition will be maintained 12/28/2020 1024 by Carrington Clamp, RN Outcome:  Adequate for Discharge 12/28/2020 0745 by Carrington Clamp, RN Outcome: Progressing   Problem: Coping: Goal: Level of anxiety will decrease 12/28/2020 1024 by Carrington Clamp, RN Outcome: Adequate for Discharge 12/28/2020 0745 by Carrington Clamp, RN Outcome: Progressing   Problem: Elimination: Goal: Will not experience complications related to bowel motility 12/28/2020 1024 by Carrington Clamp, RN Outcome: Adequate for Discharge 12/28/2020 0745 by Carrington Clamp, RN Outcome: Progressing Goal: Will not experience complications related to urinary retention 12/28/2020 1024 by Carrington Clamp, RN Outcome: Adequate for Discharge 12/28/2020 0745 by Carrington Clamp, RN Outcome: Progressing   Problem: Pain Managment: Goal: General experience of comfort will improve 12/28/2020 1024 by Carrington Clamp, RN Outcome: Adequate for Discharge 12/28/2020 0745 by Carrington Clamp, RN Outcome: Progressing   Problem: Safety: Goal: Ability to remain free from injury will improve 12/28/2020 1024 by Carrington Clamp, RN Outcome: Adequate for Discharge 12/28/2020 0745 by Carrington Clamp, RN Outcome: Progressing   Problem: Skin Integrity: Goal: Risk for impaired skin integrity will decrease 12/28/2020 1024 by Carrington Clamp, RN Outcome: Adequate for Discharge 12/28/2020 0745 by Carrington Clamp, RN Outcome: Progressing

## 2020-12-28 NOTE — Discharge Summary (Signed)
[facility]  Physician Discharge Summary   Patient ID: Cheyenne Frank MRN: [id number] DOB/AGE: March 21, 1941 80 y.o.  Admit date: 12/27/2020 Discharge date: 12/28/2020  PCP: [facility]  DISCHARGE DIAGNOSES:  Accidental fall with facial injury Contusion and hematoma involving the lower lip area Essential hypertension Paroxysmal atrial fibrillation on Eliquis Diabetes mellitus type 2 with peripheral neuropathy History of depression Dyslipidemia  RECOMMENDATIONS FOR OUTPATIENT FOLLOW UP: 1. Patient has been asked to hold her Eliquis and aspirin for a few days    Home Health: PT  Equipment/Devices:None   CODE STATUS:Full Code   DISCHARGE CONDITION: fair  Diet recommendation: As before  INITIAL HISTORY: 80 y.o. Caucasian female with medical history significant for multiple medical problems that are mentioned below including type diabetes mellitus, asthma, proximal atrial fibrillation on Eliquis, GERD, macular degeneration, restless leg syndrome, sleep apnea, unable to wear CPAP, CVA and diverticulosis, who presented to the ER with acute onset of fall with subsequent head trauma.  The patient fell asleep apparently on the toilet with subsequent fall that woke her up.  She had a subsequent left lower lip and left cheek contusion and hematoma.  She denies any headache or dizziness or blurred vision.  No paresthesias or focal muscle weakness.  No dysphagia or dysarthria, tinnitus or vertigo.  No chest pain or palpitations.  No nausea or vomiting or abdominal pain.  She denies any cough or wheezing or dyspnea.  The patient had no other significant injuries except for right knee contusion and left wrist contusion.  She was able to move both.  ED Course: Upon presentation to the ER blood pressure was 154/66 with otherwise normal vital signs.  Labs revealed glucose of 155 and creatinine 1.17 with BUN of 34 and albumin 3.2.  CBC was unremarkable except for mild  thrombocytopenia of 139.  Follows antigens and COVID-19 PCR came back negative.  Imaging: Noncontrasted CT scan revealed old left thalamic lacunar infarct with no acute abnormalities.   Maxillofacial CT showed the following: 1. Bulky superficial soft tissue hematoma overlying the left mandible which remains intact. 2. No facial fracture or other acute traumatic injury identified. 3. 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  The patient was given cold compresses for her left lower lip contusion.  She will be admitted on observation medical bed for further evaluation and monitoring.   HOSPITAL COURSE:    Accidental fall with subsequent facial injury and left lower lip and cheek contusion and significant hematoma on Eliquis. The patient was admitted to an observation medical bed.  She developed significant bruising to her lower face including the lip area.  CT scan of the maxillofacial region did not show any fractures.  Patient will be asked to hold her aspirin and Eliquis for a few days.  She otherwise feels well.  Does not have any significant pain.  Continue with cold compresses.  Follow-up with primary care provider.  Hemoglobin is stable.  Essential hypertension. May resume her home medications.    Paroxysmal atrial fibrillation. Continue beta-blocker.  Hold her Eliquis as discussed above.  Type 2 diabetes mellitus with peripheral neuropathy. Continue home medications.    Depression. Dyslipidemia.  Morbid obesity Estimated body mass index is 43.9 kg/m as calculated from the following:   Height as of this encounter:  (1.575 m).   Weight as of this encounter: 108.9 kg.  Overall patient is stable.  Wants to go home.  Seems to be stable for discharge.    PERTINENT  LABS:  The results of significant diagnostics from this hospitalization (including imaging, microbiology, ancillary and laboratory) are listed below for reference.     Microbiology: Recent Results (from the past 240 hour(s))  Resp Panel by RT-PCR (Flu A&B, Covid) Nasopharyngeal Swab     Status: None   Collection Time: 12/27/20  2:41 AM   Specimen: Nasopharyngeal Swab; Nasopharyngeal(NP) swabs in vial transport medium  Result Value Ref Range Status   SARS Coronavirus 2 by RT PCR NEGATIVE NEGATIVE Final    Comment: (NOTE) SARS-CoV-2 target nucleic acids are NOT DETECTED.  The SARS-CoV-2 RNA is generally detectable in upper respiratory specimens during the acute phase of infection. The lowest concentration of SARS-CoV-2 viral copies this assay can detect is 138 copies/mL. A negative result does not preclude SARS-Cov-2 infection and should not be used as the sole basis for treatment or other patient management decisions. A negative result may occur with  improper specimen collection/handling, submission of specimen other than nasopharyngeal swab, presence of viral mutation(s) within the areas targeted by this assay, and inadequate number of viral copies(<138 copies/mL). A negative result must be combined with clinical observations, patient history, and epidemiological information. The expected result is Negative.  Fact Sheet for Patients:  BloggerCourse.comhttps://www.fda.gov/media/152166/download  Fact Sheet for Healthcare Providers:  SeriousBroker.ithttps://www.fda.gov/media/152162/download  This test is no t yet approved or cleared by the [place] FDA and  has been authorized for detection and/or diagnosis of SARS-CoV-2 by FDA under an Emergency Use Authorization (EUA). This EUA will remain  in effect (meaning this test can be used) for the duration of the COVID-19 declaration under Section 564(b)(1) of the Act, 21 U.S.C.section 360bbb-3(b)(1), unless the authorization is terminated  or revoked sooner.       Influenza A by PCR NEGATIVE NEGATIVE Final   Influenza B by PCR NEGATIVE NEGATIVE Final    Comment: (NOTE) The Xpert Xpress SARS-CoV-2/FLU/RSV plus assay is  intended as an aid in the diagnosis of influenza from Nasopharyngeal swab specimens and should not be used as a sole basis for treatment. Nasal washings and aspirates are unacceptable for Xpert Xpress SARS-CoV-2/FLU/RSV testing.  Fact Sheet for Patients: BloggerCourse.comhttps://www.fda.gov/media/152166/download  Fact Sheet for Healthcare Providers: SeriousBroker.ithttps://www.fda.gov/media/152162/download  This test is not yet approved or cleared by the [place] FDA and has been authorized for detection and/or diagnosis of SARS-CoV-2 by FDA under an Emergency Use Authorization (EUA). This EUA will remain in effect (meaning this test can be used) for the duration of the COVID-19 declaration under Section 564(b)(1) of the Act, 21 U.S.C. section 360bbb-3(b)(1), unless the authorization is terminated or revoked.  Performed at [facility], [address] [place], [place] [address]      Labs:  COVID-19 Labs    Lab Results  Component Value Date   SARSCOV2NAA NEGATIVE 12/27/2020      Basic Metabolic Panel: Recent Labs  Lab 12/27/20 0241 12/28/20 0528  NA 139 142  K 4.1 4.2  CL 100 100  CO2 29 34*  GLUCOSE 155* 187*  BUN 34* 26*  CREATININE 1.17* 1.15*  CALCIUM 8.8* 8.8*   Liver Function Tests: Recent Labs  Lab 12/27/20 0241  AST 22  ALT 18  ALKPHOS 83  BILITOT 0.5  PROT 6.2*  ALBUMIN 3.2*   CBC: Recent Labs  Lab 12/27/20 0241 12/28/20 0528  WBC 6.5 5.5  NEUTROABS 4.5  --   HGB 12.5 11.7*  HCT 39.3 37.2  MCV 92.5 93.0  PLT 139* 122*    CBG: Recent Labs  Lab 12/27/20 0743 12/27/20 1215 12/27/20 1622 12/27/20 2108 12/28/20 0743  GLUCAP 156* 209* 146* 229* 220*     IMAGING STUDIES DG Chest 1 View  Addendum Date: 12/27/2020   ADDENDUM REPORT: 12/27/2020 03:41 ADDENDUM: There is deviation of the trachea to the right which is mildly increased in severity when compared to the prior study and secondary to mass effect from positioning of the brachiocephalic  artery (seen on prior chest CT, dated July 14, 2020). Electronically Signed   By: Aram Candela M.D.   On: 12/27/2020 03:41   Result Date: 12/27/2020 CLINICAL DATA:  Status post fall. EXAM: CHEST  1 VIEW COMPARISON:  July 14, 2020 FINDINGS: The lungs are hyperinflated. Mild, chronic appearing increased lung markings are seen. There is no evidence of acute infiltrate, pleural effusion or pneumothorax. The heart size and mediastinal contours are within normal limits. A chronic eighth right rib fracture is seen. Degenerative changes are seen throughout the thoracic spine with bilateral radiopaque pedicle screws noted within the lower thoracic and upper lumbar spine. IMPRESSION: Chronic appearing increased lung markings without acute or active cardiopulmonary disease. Electronically Signed: By: Aram Candela M.D. On: 12/27/2020 03:33   DG Wrist Complete Left  Result Date: 12/27/2020 CLINICAL DATA:  Status post fall. EXAM: LEFT WRIST - COMPLETE 3+ VIEW COMPARISON:  None. FINDINGS: There is no evidence of fracture or dislocation. Moderate to marked severity degenerative changes seen along the lateral aspect of the left wrist. This is most prominent along the carpometacarpal articulation of the left thumb. Soft tissues are unremarkable. IMPRESSION: Degenerative changes, without an acute osseous abnormality. Electronically Signed   By: Aram Candela M.D.   On: 12/27/2020 03:35   CT Head Wo Contrast  Result Date: 12/27/2020 CLINICAL DATA:  80 year old female status post fall face 1st off of toilet. Hematoma. On blood thinners. EXAM: CT HEAD WITHOUT CONTRAST TECHNIQUE: Contiguous axial images were obtained from the base of the skull through the vertex without intravenous contrast. COMPARISON:  Head CT 07/10/2020.  Face CT today reported separately. FINDINGS: Brain: Stable cerebral volume since last year. No midline shift, ventriculomegaly, mass effect, evidence of mass lesion, intracranial  hemorrhage or evidence of cortically based acute infarction. Chronic lacunar infarct/cystic encephalomalacia at the left cauda thalamic groove. Stable and largely normal for age gray-white matter differentiation elsewhere. No cortical encephalomalacia identified. Vascular: Calcified atherosclerosis at the skull base. No suspicious intracranial vascular hyperdensity. Skull: Chronic left superior frontal convexity burr hole. No skull fracture identified. Sinuses/Orbits: Visualized paranasal sinuses and mastoids are stable and well aerated. Other: No acute orbit or scalp soft tissue injury identified. IMPRESSION: 1. No acute intracranial abnormality. No acute traumatic injury identified. 2. Chronic left thalamic lacunar infarct. Electronically Signed   By: Odessa Fleming M.D.   On: 12/27/2020 04:47   DG Knee Complete 4 Views Right  Result Date: 12/27/2020 CLINICAL DATA:  Status post fall. EXAM: RIGHT KNEE - COMPLETE 4+ VIEW COMPARISON:  None. FINDINGS: No evidence of an acute fracture or dislocation. Moderate to marked severity medial and lateral tibiofemoral compartment space narrowing is seen. Patellofemoral narrowing is also noted. A small joint effusion is seen. IMPRESSION: 1. Moderate to marked severity degenerative changes with a small joint effusion. Electronically Signed   By: Aram Candela M.D.   On: 12/27/2020 03:34   CT Maxillofacial WO CM  Result Date: 12/27/2020 CLINICAL DATA:  80 year old female status post fall face 1st off of toilet. Hematoma. On blood thinners. EXAM: CT MAXILLOFACIAL WITHOUT CONTRAST TECHNIQUE:  Multidetector CT imaging of the maxillofacial structures was performed. Multiplanar CT image reconstructions were also generated. COMPARISON:  Head CT today and earlier. FINDINGS: Osseous: Mandible intact. Bilateral chronic TMJ degeneration. No acute dental finding identified. No maxilla or zygoma fracture. No acute nasal bone fracture. Pterygoid plates and central skull base appear intact.  Advanced degenerative changes in the visible cervical spine, but visible cervical vertebrae appear intact. Intermittent posterior element degeneration and ankylosis noted bilaterally. Visible calvarium intact. Orbits: Intact orbital walls. Postoperative changes to both globes. Orbits soft tissues appears symmetric and negative. Sinuses: Clear.  Tympanic cavities and mastoids are clear. Soft tissues: Superficial soft tissue hematoma overlying the left mandible measures up to 2.6 cm in thickness. Regional soft tissue stranding. No soft tissue gas. Sublingual, submandibular and masticator spaces are spared. Negative visible other noncontrast deep soft tissue spaces of the face, neck. No upper cervical lymphadenopathy. Limited intracranial: Stable to the head CT reported separately today. IMPRESSION: 1. Bulky superficial soft tissue hematoma overlying the left mandible which remains intact. 2. No facial fracture or other acute traumatic injury identified. 3. 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 Electronically Signed   By: Odessa Fleming M.D.   On: 12/27/2020 04:51    DISCHARGE EXAMINATION: Vitals:   12/27/20 1937 12/28/20 0524 12/28/20 0801 12/28/20 0951  BP: (!) 118/59 (!) 160/69 (!) 135/43   Pulse: 81 72 73 80  Resp: 16 18 18 20   Temp: 98.9 F (37.2 C) 98 F (36.7 C) 98.5 F (36.9 C)   TempSrc:   Oral   SpO2: 97% 99% 97% 92%  Weight:      Height:       General appearance: alert, cooperative, appears stated age and no distress Head: Swelling of lower lip and significant bruising noted over lower face. Resp: clear to auscultation bilaterally Cardio: regular rate and rhythm, S1, S2 normal, no murmur, click, rub or gallop GI: soft, non-tender; bowel sounds normal; no masses,  no organomegaly  DISPOSITION: Home  Discharge Instructions    Call MD for:  difficulty breathing, headache or visual disturbances   Complete by: As directed    Call MD for:  extreme fatigue   Complete by: As  directed    Call MD for:  persistant dizziness or light-headedness   Complete by: As directed    Call MD for:  persistant nausea and vomiting   Complete by: As directed    Call MD for:  severe uncontrolled pain   Complete by: As directed    Call MD for:  temperature >100.4   Complete by: As directed    Diet - low sodium heart healthy   Complete by: As directed    Diet Carb Modified   Complete by: As directed    Discharge instructions   Complete by: As directed    Please resume your aspirin and Eliquis only on Saturday, April 16. Follow-up with your primary care provider on Monday. Seek attention immediately if your symptoms recur.  You were cared for by a hospitalist during your hospital stay. If you have any questions about your discharge medications or the care you received while you were in the hospital after you are discharged, you can call the unit and asked to speak with the hospitalist on call if the hospitalist that took care of you is not available. Once you are discharged, your primary care physician will handle any further medical issues. Please note that NO REFILLS for any discharge medications will be authorized once  you are discharged, as it is imperative that you return to your primary care physician (or establish a relationship with a primary care physician if you do not have one) for your aftercare needs so that they can reassess your need for medications and monitor your lab values. If you do not have a primary care physician, you can call [phone] for a physician referral.   Increase activity slowly   Complete by: As directed          Allergies as of 12/28/2020      Reactions   Codeine Palpitations   Other Reaction: RACES HEART   Dilaudid [hydromorphone] Other (See Comments), Rash   Other Reaction: lightheaded, altered ms, nervous, Paranoia paranoid Other Reaction: lightheaded, altered ms, nervous, Paranoia   Demerol [meperidine] Other (See Comments), Rash    agitated Agitation   Pravastatin Other (See Comments)   Muscle aches   Lipitor [atorvastatin] Other (See Comments), Itching   unknown   Nifedipine Other (See Comments)   unknown   Statins Rash   Makes her crazy   Sulfa Antibiotics Rash   Vancomycin Rash   deafness      Medication List    TAKE these medications   acetaminophen 500 MG tablet Commonly known as: TYLENOL Take 500-1,000 mg by mouth every 6 (six) hours as needed for mild pain or fever.   albuterol 108 (90 Base) MCG/ACT inhaler Commonly known as: VENTOLIN HFA albuterol sulfate HFA 90 mcg/actuation aerosol inhaler  INHALE 2 PUFFS INTO LUNGS 4 TIMES DAILY   aspirin EC 81 MG tablet Take 1 tablet (81 mg total) by mouth daily. MAY RESUME ON 12/31/20 Start taking on: December 31, 2020 What changed:   additional instructions  These instructions start on December 31, 2020. If you are unsure what to do until then, ask your doctor or other care provider.   carvedilol 25 MG tablet Commonly known as: COREG Take 25 mg by mouth 2 (two) times daily.   doxycycline 100 MG tablet Commonly known as: VIBRA-TABS Take 100 mg by mouth 2 (two) times daily.   Eliquis 5 MG Tabs tablet Generic drug: apixaban Take 1 tablet (5 mg total) by mouth 2 (two) times daily. MAY RESUME ON 12/31/20 Start taking on: December 31, 2020 What changed:   additional instructions  These instructions start on December 31, 2020. If you are unsure what to do until then, ask your doctor or other care provider.   furosemide 20 MG tablet Commonly known as: LASIX Take 20 mg by mouth daily.   gabapentin 300 MG capsule Commonly known as: NEURONTIN Take 300 mg by mouth 2 (two) times daily.   Incruse Ellipta 62.5 MCG/INH Aepb Generic drug: umeclidinium bromide Inhale 1 puff into the lungs.   insulin glargine 100 UNIT/ML injection Commonly known as: LANTUS Inject 64 Units into the skin at bedtime.   insulin lispro 100 UNIT/ML injection Commonly known as:  HUMALOG Inject 22 Units into the skin 3 (three) times daily with meals.   ketoconazole 2 % cream Commonly known as: NIZORAL Apply topically daily.   losartan 50 MG tablet Commonly known as: COZAAR Take 50 mg by mouth daily.   multivitamin Tabs tablet Take 1 tablet by mouth daily.   multivitamin with minerals Tabs tablet Take 1 tablet by mouth daily.   nystatin cream Commonly known as: MYCOSTATIN Apply topically 2 (two) times daily.   nystatin powder Generic drug: nystatin SMARTSIG:Packet(s) Topical Twice Daily   omeprazole 40 MG capsule Commonly known as:  PRILOSEC Take 40 mg by mouth daily.   PARoxetine 40 MG tablet Commonly known as: PAXIL Take 40 mg by mouth at bedtime.   rosuvastatin 5 MG tablet Commonly known as: CRESTOR Take 2.5 mg by mouth at bedtime.   sitaGLIPtin 50 MG tablet Commonly known as: JANUVIA Take 50 mg by mouth daily.   traZODone 50 MG tablet Commonly known as: DESYREL Take 50 mg by mouth at bedtime.         Follow-up Information    [place]. Go on 12/29/2020.   Specialty: Family Medicine Why: patient already has a scheduled appointment for tomorrow at 11:20 am Contact information: [address] [place] [address] [phone]               TOTAL DISCHARGE TIME: 35 mins  Riggins Cisek Rito Ehrlich  [facility] [employer] on [email or web address]  12/28/2020, 5:45 PM

## 2020-12-28 NOTE — Progress Notes (Signed)
  Reviewed discharge instructions verbalized  understanding of  when to restart ASA and Eliquis.  IV site benign.

## 2020-12-28 NOTE — Plan of Care (Signed)

## 2021-01-13 ENCOUNTER — Other Ambulatory Visit: Payer: Self-pay

## 2021-01-13 ENCOUNTER — Encounter: Payer: Self-pay | Admitting: Emergency Medicine

## 2021-01-13 ENCOUNTER — Emergency Department: Payer: Medicare Other

## 2021-01-13 DIAGNOSIS — M25562 Pain in left knee: Secondary | ICD-10-CM | POA: Diagnosis not present

## 2021-01-13 DIAGNOSIS — R519 Headache, unspecified: Secondary | ICD-10-CM | POA: Diagnosis not present

## 2021-01-13 DIAGNOSIS — M545 Low back pain, unspecified: Secondary | ICD-10-CM | POA: Diagnosis not present

## 2021-01-13 DIAGNOSIS — W010XXA Fall on same level from slipping, tripping and stumbling without subsequent striking against object, initial encounter: Secondary | ICD-10-CM | POA: Insufficient documentation

## 2021-01-13 DIAGNOSIS — Z5321 Procedure and treatment not carried out due to patient leaving prior to being seen by health care provider: Secondary | ICD-10-CM | POA: Diagnosis not present

## 2021-01-13 NOTE — ED Triage Notes (Signed)
Pt in via EMS. Pt reports slipped and fell onto the floor. Pt c/o pain to left side of face, back and knee. Pt also on blood thinners.

## 2021-01-14 ENCOUNTER — Emergency Department
Admission: EM | Admit: 2021-01-14 | Discharge: 2021-01-14 | Disposition: A | Discharge status: Home / Self Care | Payer: Medicare Other | Attending: Emergency Medicine | Admitting: Emergency Medicine

## 2021-01-14 NOTE — ED Notes (Signed)
Pt states she is going to leave. Pt encouraged to stay, but states her daughter is coming to get her.

## 2021-05-16 ENCOUNTER — Encounter: Payer: Self-pay | Admitting: Emergency Medicine

## 2021-05-16 ENCOUNTER — Emergency Department: Payer: Medicare Other

## 2021-05-16 ENCOUNTER — Other Ambulatory Visit: Payer: Self-pay

## 2021-05-16 ENCOUNTER — Inpatient Hospital Stay
Admission: EM | Admit: 2021-05-16 | Discharge: 2021-05-22 | DRG: 308 | Disposition: A | Discharge status: Home Health | Payer: Medicare Other | Attending: Internal Medicine | Admitting: Internal Medicine

## 2021-05-16 DIAGNOSIS — I5033 Acute on chronic diastolic (congestive) heart failure: Secondary | ICD-10-CM | POA: Diagnosis present

## 2021-05-16 DIAGNOSIS — E1122 Type 2 diabetes mellitus with diabetic chronic kidney disease: Secondary | ICD-10-CM | POA: Diagnosis present

## 2021-05-16 DIAGNOSIS — Z825 Family history of asthma and other chronic lower respiratory diseases: Secondary | ICD-10-CM

## 2021-05-16 DIAGNOSIS — F419 Anxiety disorder, unspecified: Secondary | ICD-10-CM | POA: Diagnosis present

## 2021-05-16 DIAGNOSIS — Z7901 Long term (current) use of anticoagulants: Secondary | ICD-10-CM

## 2021-05-16 DIAGNOSIS — R000 Tachycardia, unspecified: Secondary | ICD-10-CM | POA: Diagnosis not present

## 2021-05-16 DIAGNOSIS — H9190 Unspecified hearing loss, unspecified ear: Secondary | ICD-10-CM | POA: Diagnosis present

## 2021-05-16 DIAGNOSIS — E66813 Obesity, class 3: Secondary | ICD-10-CM

## 2021-05-16 DIAGNOSIS — Z8673 Personal history of transient ischemic attack (TIA), and cerebral infarction without residual deficits: Secondary | ICD-10-CM

## 2021-05-16 DIAGNOSIS — J9601 Acute respiratory failure with hypoxia: Secondary | ICD-10-CM | POA: Diagnosis present

## 2021-05-16 DIAGNOSIS — F32A Depression, unspecified: Secondary | ICD-10-CM | POA: Diagnosis present

## 2021-05-16 DIAGNOSIS — G2581 Restless legs syndrome: Secondary | ICD-10-CM | POA: Diagnosis present

## 2021-05-16 DIAGNOSIS — M1990 Unspecified osteoarthritis, unspecified site: Secondary | ICD-10-CM | POA: Diagnosis present

## 2021-05-16 DIAGNOSIS — R778 Other specified abnormalities of plasma proteins: Secondary | ICD-10-CM | POA: Diagnosis present

## 2021-05-16 DIAGNOSIS — I480 Paroxysmal atrial fibrillation: Principal | ICD-10-CM | POA: Diagnosis present

## 2021-05-16 DIAGNOSIS — Z881 Allergy status to other antibiotic agents status: Secondary | ICD-10-CM

## 2021-05-16 DIAGNOSIS — I4891 Unspecified atrial fibrillation: Secondary | ICD-10-CM | POA: Diagnosis not present

## 2021-05-16 DIAGNOSIS — K219 Gastro-esophageal reflux disease without esophagitis: Secondary | ICD-10-CM | POA: Diagnosis present

## 2021-05-16 DIAGNOSIS — N179 Acute kidney failure, unspecified: Secondary | ICD-10-CM

## 2021-05-16 DIAGNOSIS — Z6841 Body Mass Index (BMI) 40.0 and over, adult: Secondary | ICD-10-CM

## 2021-05-16 DIAGNOSIS — I130 Hypertensive heart and chronic kidney disease with heart failure and stage 1 through stage 4 chronic kidney disease, or unspecified chronic kidney disease: Secondary | ICD-10-CM | POA: Diagnosis present

## 2021-05-16 DIAGNOSIS — E1165 Type 2 diabetes mellitus with hyperglycemia: Secondary | ICD-10-CM | POA: Diagnosis present

## 2021-05-16 DIAGNOSIS — Z9049 Acquired absence of other specified parts of digestive tract: Secondary | ICD-10-CM

## 2021-05-16 DIAGNOSIS — G4733 Obstructive sleep apnea (adult) (pediatric): Secondary | ICD-10-CM | POA: Diagnosis present

## 2021-05-16 DIAGNOSIS — J449 Chronic obstructive pulmonary disease, unspecified: Secondary | ICD-10-CM | POA: Diagnosis present

## 2021-05-16 DIAGNOSIS — Z7984 Long term (current) use of oral hypoglycemic drugs: Secondary | ICD-10-CM

## 2021-05-16 DIAGNOSIS — M797 Fibromyalgia: Secondary | ICD-10-CM | POA: Diagnosis present

## 2021-05-16 DIAGNOSIS — Z96652 Presence of left artificial knee joint: Secondary | ICD-10-CM | POA: Diagnosis present

## 2021-05-16 DIAGNOSIS — E1142 Type 2 diabetes mellitus with diabetic polyneuropathy: Secondary | ICD-10-CM

## 2021-05-16 DIAGNOSIS — R079 Chest pain, unspecified: Secondary | ICD-10-CM

## 2021-05-16 DIAGNOSIS — R627 Adult failure to thrive: Secondary | ICD-10-CM | POA: Diagnosis present

## 2021-05-16 DIAGNOSIS — E785 Hyperlipidemia, unspecified: Secondary | ICD-10-CM | POA: Diagnosis present

## 2021-05-16 DIAGNOSIS — Z882 Allergy status to sulfonamides status: Secondary | ICD-10-CM

## 2021-05-16 DIAGNOSIS — Z79891 Long term (current) use of opiate analgesic: Secondary | ICD-10-CM

## 2021-05-16 DIAGNOSIS — Z90710 Acquired absence of both cervix and uterus: Secondary | ICD-10-CM

## 2021-05-16 DIAGNOSIS — Z7982 Long term (current) use of aspirin: Secondary | ICD-10-CM

## 2021-05-16 DIAGNOSIS — Z888 Allergy status to other drugs, medicaments and biological substances status: Secondary | ICD-10-CM

## 2021-05-16 DIAGNOSIS — Z794 Long term (current) use of insulin: Secondary | ICD-10-CM | POA: Diagnosis not present

## 2021-05-16 DIAGNOSIS — I5031 Acute diastolic (congestive) heart failure: Secondary | ICD-10-CM

## 2021-05-16 DIAGNOSIS — Z885 Allergy status to narcotic agent status: Secondary | ICD-10-CM

## 2021-05-16 DIAGNOSIS — E119 Type 2 diabetes mellitus without complications: Secondary | ICD-10-CM

## 2021-05-16 DIAGNOSIS — Z79899 Other long term (current) drug therapy: Secondary | ICD-10-CM

## 2021-05-16 DIAGNOSIS — R062 Wheezing: Secondary | ICD-10-CM

## 2021-05-16 DIAGNOSIS — N1832 Chronic kidney disease, stage 3b: Secondary | ICD-10-CM | POA: Diagnosis present

## 2021-05-16 DIAGNOSIS — Z20822 Contact with and (suspected) exposure to covid-19: Secondary | ICD-10-CM | POA: Diagnosis present

## 2021-05-16 LAB — CBC WITH DIFFERENTIAL/PLATELET
Abs Immature Granulocytes: 0.06 10*3/uL (ref 0.00–0.07)
Basophils Absolute: 0 10*3/uL (ref 0.0–0.1)
Basophils Relative: 0 %
Eosinophils Absolute: 0.1 10*3/uL (ref 0.0–0.5)
Eosinophils Relative: 2 %
HCT: 43.3 % (ref 36.0–46.0)
Hemoglobin: 14.5 g/dL (ref 12.0–15.0)
Immature Granulocytes: 1 %
Lymphocytes Relative: 21 %
Lymphs Abs: 1.7 10*3/uL (ref 0.7–4.0)
MCH: 30.9 pg (ref 26.0–34.0)
MCHC: 33.5 g/dL (ref 30.0–36.0)
MCV: 92.3 fL (ref 80.0–100.0)
Monocytes Absolute: 0.9 10*3/uL (ref 0.1–1.0)
Monocytes Relative: 11 %
Neutro Abs: 5.5 10*3/uL (ref 1.7–7.7)
Neutrophils Relative %: 65 %
Platelets: 160 10*3/uL (ref 150–400)
RBC: 4.69 MIL/uL (ref 3.87–5.11)
RDW: 14 % (ref 11.5–15.5)
WBC: 8.3 10*3/uL (ref 4.0–10.5)
nRBC: 0 % (ref 0.0–0.2)

## 2021-05-16 LAB — BRAIN NATRIURETIC PEPTIDE: B Natriuretic Peptide: 70.7 pg/mL (ref 0.0–100.0)

## 2021-05-16 LAB — URINALYSIS, COMPLETE (UACMP) WITH MICROSCOPIC
Bacteria, UA: NONE SEEN
Bilirubin Urine: NEGATIVE
Glucose, UA: NEGATIVE mg/dL
Ketones, ur: NEGATIVE mg/dL
Leukocytes,Ua: NEGATIVE
Nitrite: NEGATIVE
Protein, ur: 100 mg/dL — AB
Specific Gravity, Urine: 1.027 (ref 1.005–1.030)
pH: 5 (ref 5.0–8.0)

## 2021-05-16 LAB — COMPREHENSIVE METABOLIC PANEL
ALT: 23 U/L (ref 0–44)
AST: 28 U/L (ref 15–41)
Albumin: 3.4 g/dL — ABNORMAL LOW (ref 3.5–5.0)
Alkaline Phosphatase: 85 U/L (ref 38–126)
Anion gap: 9 (ref 5–15)
BUN: 35 mg/dL — ABNORMAL HIGH (ref 8–23)
CO2: 32 mmol/L (ref 22–32)
Calcium: 9.3 mg/dL (ref 8.9–10.3)
Chloride: 98 mmol/L (ref 98–111)
Creatinine, Ser: 1.25 mg/dL — ABNORMAL HIGH (ref 0.44–1.00)
GFR, Estimated: 44 mL/min — ABNORMAL LOW (ref >60)
Glucose, Bld: 176 mg/dL — ABNORMAL HIGH (ref 70–99)
Potassium: 4.2 mmol/L (ref 3.5–5.1)
Sodium: 139 mmol/L (ref 135–145)
Total Bilirubin: 0.8 mg/dL (ref 0.3–1.2)
Total Protein: 6.9 g/dL (ref 6.5–8.1)

## 2021-05-16 LAB — TROPONIN I (HIGH SENSITIVITY)
Troponin I (High Sensitivity): 13 ng/L (ref <18)
Troponin I (High Sensitivity): 25 ng/L — ABNORMAL HIGH (ref <18)

## 2021-05-16 LAB — RESP PANEL BY RT-PCR (FLU A&B, COVID) ARPGX2
Influenza A by PCR: NEGATIVE
Influenza B by PCR: NEGATIVE
SARS Coronavirus 2 by RT PCR: NEGATIVE

## 2021-05-16 LAB — LACTIC ACID, PLASMA
Lactic Acid, Venous: 1 mmol/L (ref 0.5–1.9)
Lactic Acid, Venous: 1.3 mmol/L (ref 0.5–1.9)

## 2021-05-16 LAB — MAGNESIUM: Magnesium: 1.9 mg/dL (ref 1.7–2.4)

## 2021-05-16 LAB — CBG MONITORING, ED: Glucose-Capillary: 141 mg/dL — ABNORMAL HIGH (ref 70–99)

## 2021-05-16 MED ORDER — INSULIN ASPART 100 UNIT/ML IJ SOLN
0.0000 [IU] | Freq: Three times a day (TID) | INTRAMUSCULAR | Status: DC
Start: 2021-05-16 — End: 2021-05-22
  Administered 2021-05-16 – 2021-05-17 (×2): 2 [IU] via SUBCUTANEOUS
  Administered 2021-05-17 (×2): 3 [IU] via SUBCUTANEOUS
  Administered 2021-05-17: 5 [IU] via SUBCUTANEOUS
  Administered 2021-05-18 (×2): 3 [IU] via SUBCUTANEOUS
  Administered 2021-05-19: 8 [IU] via SUBCUTANEOUS
  Administered 2021-05-19: 5 [IU] via SUBCUTANEOUS
  Administered 2021-05-19: 2 [IU] via SUBCUTANEOUS
  Administered 2021-05-20: 3 [IU] via SUBCUTANEOUS
  Administered 2021-05-20: 5 [IU] via SUBCUTANEOUS
  Administered 2021-05-20: 3 [IU] via SUBCUTANEOUS
  Administered 2021-05-20: 5 [IU] via SUBCUTANEOUS
  Administered 2021-05-21: 3 [IU] via SUBCUTANEOUS
  Administered 2021-05-21: 5 [IU] via SUBCUTANEOUS
  Administered 2021-05-21 (×2): 8 [IU] via SUBCUTANEOUS
  Administered 2021-05-22: 3 [IU] via SUBCUTANEOUS
  Administered 2021-05-22: 2 [IU] via SUBCUTANEOUS
  Filled 2021-05-16 (×20): qty 1

## 2021-05-16 MED ORDER — LINAGLIPTIN 5 MG PO TABS
5.0000 mg | ORAL_TABLET | Freq: Every day | ORAL | Status: DC
  Administered 2021-05-17 – 2021-05-22 (×6): 5 mg via ORAL
  Filled 2021-05-16 (×7): qty 1

## 2021-05-16 MED ORDER — ADULT MULTIVITAMIN W/MINERALS CH
1.0000 | ORAL_TABLET | Freq: Every day | ORAL | Status: DC
  Administered 2021-05-17 – 2021-05-22 (×6): 1 via ORAL
  Filled 2021-05-16 (×6): qty 1

## 2021-05-16 MED ORDER — AMIODARONE LOAD VIA INFUSION
150.0000 mg | Freq: Once | INTRAVENOUS | Status: AC
  Administered 2021-05-16: 150 mg via INTRAVENOUS
  Filled 2021-05-16: qty 83.34

## 2021-05-16 MED ORDER — GABAPENTIN 300 MG PO CAPS
300.0000 mg | ORAL_CAPSULE | Freq: Two times a day (BID) | ORAL | Status: DC
  Administered 2021-05-16 – 2021-05-22 (×12): 300 mg via ORAL
  Filled 2021-05-16 (×12): qty 1

## 2021-05-16 MED ORDER — ACETAMINOPHEN 650 MG RE SUPP: 650.0000 mg | Freq: Four times a day (QID) | RECTAL | Status: DC | PRN

## 2021-05-16 MED ORDER — APIXABAN 5 MG PO TABS
5.0000 mg | ORAL_TABLET | Freq: Two times a day (BID) | ORAL | Status: DC
  Administered 2021-05-16 – 2021-05-22 (×12): 5 mg via ORAL
  Filled 2021-05-16 (×12): qty 1

## 2021-05-16 MED ORDER — MAGNESIUM HYDROXIDE 400 MG/5ML PO SUSP: 30.0000 mL | Freq: Every day | ORAL | Status: DC | PRN

## 2021-05-16 MED ORDER — FUROSEMIDE 10 MG/ML IJ SOLN
40.0000 mg | Freq: Two times a day (BID) | INTRAMUSCULAR | Status: DC
  Administered 2021-05-16: 40 mg via INTRAVENOUS
  Filled 2021-05-16: qty 4

## 2021-05-16 MED ORDER — ROSUVASTATIN CALCIUM 5 MG PO TABS
2.5000 mg | ORAL_TABLET | Freq: Every day | ORAL | Status: DC
  Administered 2021-05-16 – 2021-05-21 (×6): 2.5 mg via ORAL
  Filled 2021-05-16 (×2): qty 1
  Filled 2021-05-16: qty 0.5
  Filled 2021-05-16 (×3): qty 1

## 2021-05-16 MED ORDER — ZINC OXIDE 40 % EX OINT
1.0000 "application " | TOPICAL_OINTMENT | CUTANEOUS | Status: DC | PRN
  Filled 2021-05-16: qty 113

## 2021-05-16 MED ORDER — ALBUTEROL SULFATE (2.5 MG/3ML) 0.083% IN NEBU
2.5000 mg | INHALATION_SOLUTION | RESPIRATORY_TRACT | Status: DC | PRN
  Administered 2021-05-18: 2.5 mg via RESPIRATORY_TRACT
  Filled 2021-05-16: qty 3

## 2021-05-16 MED ORDER — ALBUTEROL SULFATE HFA 108 (90 BASE) MCG/ACT IN AERS: 2.0000 | INHALATION_SPRAY | RESPIRATORY_TRACT | Status: DC | PRN

## 2021-05-16 MED ORDER — ACETAMINOPHEN 325 MG PO TABS
650.0000 mg | ORAL_TABLET | Freq: Four times a day (QID) | ORAL | Status: DC | PRN
  Administered 2021-05-18 – 2021-05-21 (×3): 650 mg via ORAL
  Filled 2021-05-16 (×3): qty 2

## 2021-05-16 MED ORDER — FUROSEMIDE 40 MG PO TABS: 20.0000 mg | ORAL_TABLET | Freq: Every day | ORAL | Status: DC

## 2021-05-16 MED ORDER — AMIODARONE HCL IN DEXTROSE 360-4.14 MG/200ML-% IV SOLN
30.0000 mg/h | INTRAVENOUS | Status: DC
  Administered 2021-05-17: 30 mg/h via INTRAVENOUS
  Filled 2021-05-16 (×2): qty 200

## 2021-05-16 MED ORDER — TRAZODONE HCL 50 MG PO TABS: 50.0000 mg | ORAL_TABLET | Freq: Every day | ORAL | Status: DC

## 2021-05-16 MED ORDER — TRAZODONE HCL 50 MG PO TABS
25.0000 mg | ORAL_TABLET | Freq: Every evening | ORAL | Status: DC | PRN
  Administered 2021-05-17 – 2021-05-20 (×3): 25 mg via ORAL
  Filled 2021-05-16 (×3): qty 1

## 2021-05-16 MED ORDER — AMIODARONE HCL IN DEXTROSE 360-4.14 MG/200ML-% IV SOLN
60.0000 mg/h | INTRAVENOUS | Status: DC
  Administered 2021-05-16: 60 mg/h via INTRAVENOUS
  Filled 2021-05-16: qty 200

## 2021-05-16 MED ORDER — INSULIN GLARGINE-YFGN 100 UNIT/ML ~~LOC~~ SOLN
64.0000 [IU] | Freq: Every day | SUBCUTANEOUS | Status: DC
  Administered 2021-05-16 – 2021-05-20 (×5): 64 [IU] via SUBCUTANEOUS
  Filled 2021-05-16 (×6): qty 0.64

## 2021-05-16 MED ORDER — LOSARTAN POTASSIUM 50 MG PO TABS: 50.0000 mg | ORAL_TABLET | Freq: Every day | ORAL | Status: DC

## 2021-05-16 MED ORDER — ONDANSETRON HCL 4 MG/2ML IJ SOLN: 4.0000 mg | Freq: Four times a day (QID) | INTRAMUSCULAR | Status: DC | PRN

## 2021-05-16 MED ORDER — CARVEDILOL 25 MG PO TABS
25.0000 mg | ORAL_TABLET | Freq: Two times a day (BID) | ORAL | Status: DC
  Administered 2021-05-16 – 2021-05-17 (×2): 25 mg via ORAL
  Filled 2021-05-16 (×2): qty 1

## 2021-05-16 MED ORDER — ASPIRIN EC 81 MG PO TBEC
81.0000 mg | DELAYED_RELEASE_TABLET | Freq: Every day | ORAL | Status: DC
  Administered 2021-05-17 – 2021-05-22 (×6): 81 mg via ORAL
  Filled 2021-05-16 (×6): qty 1

## 2021-05-16 MED ORDER — PANTOPRAZOLE SODIUM 40 MG PO TBEC
40.0000 mg | DELAYED_RELEASE_TABLET | Freq: Every day | ORAL | Status: DC
  Administered 2021-05-17 – 2021-05-22 (×6): 40 mg via ORAL
  Filled 2021-05-16 (×6): qty 1

## 2021-05-16 MED ORDER — UMECLIDINIUM BROMIDE 62.5 MCG/INH IN AEPB: 1.0000 | INHALATION_SPRAY | Freq: Every day | RESPIRATORY_TRACT | Status: DC

## 2021-05-16 MED ORDER — DILTIAZEM HCL 25 MG/5ML IV SOLN
15.0000 mg | Freq: Once | INTRAVENOUS | Status: AC
  Administered 2021-05-16: 15 mg via INTRAVENOUS
  Filled 2021-05-16: qty 5

## 2021-05-16 MED ORDER — RENA-VITE PO TABS
1.0000 | ORAL_TABLET | Freq: Every day | ORAL | Status: DC
  Administered 2021-05-18 – 2021-05-22 (×5): 1 via ORAL
  Filled 2021-05-16 (×8): qty 1

## 2021-05-16 MED ORDER — IOHEXOL 350 MG/ML SOLN
75.0000 mL | Freq: Once | INTRAVENOUS | Status: AC | PRN
  Administered 2021-05-16: 75 mL via INTRAVENOUS

## 2021-05-16 MED ORDER — ONDANSETRON HCL 4 MG PO TABS: 4.0000 mg | ORAL_TABLET | Freq: Four times a day (QID) | ORAL | Status: DC | PRN

## 2021-05-16 MED ORDER — PAROXETINE HCL 20 MG PO TABS
40.0000 mg | ORAL_TABLET | Freq: Every day | ORAL | Status: DC
  Administered 2021-05-16 – 2021-05-21 (×6): 40 mg via ORAL
  Filled 2021-05-16 (×8): qty 2

## 2021-05-16 MED ORDER — DILTIAZEM HCL-DEXTROSE 125-5 MG/125ML-% IV SOLN (PREMIX)
5.0000 mg/h | INTRAVENOUS | Status: DC
  Administered 2021-05-16: 5 mg/h via INTRAVENOUS
  Administered 2021-05-16: 10 mg/h via INTRAVENOUS
  Filled 2021-05-16: qty 125

## 2021-05-16 NOTE — ED Provider Notes (Signed)
[facility] Emergency Department Provider Note  ____________________________________________   Event Date/Time   First MD Initiated Contact with Patient 05/16/21 1730     (approximate)  I have reviewed the triage vital signs and the nursing notes.   HISTORY  Chief Complaint Chest Pain   HPI Cheyenne Frank is a 80 y.o. female with a history of A. fib on Eliquis, diabetes on insulin, CVA, hypertension, and remaining history as listed below presents to the emergency department for treatment and evaluation of chest pain that started approximately 1 and half hours ago.  Patient states that prior to she was in her normal state of health.  She states that she began to have central chest pain that now radiates into the right and left jaw.  She also complains of shortness of breath but denies diaphoresis or nausea.  She states that she has never felt this way before.  She took 325 mg of aspirin prior to EMS arrival.   Past Medical History:  Diagnosis Date   Anemia of chronic disease    Anxiety    Arthritis    Asthma    no problems since 1999 (after moving into a new home)   Chronic airway obstruction (HCC)    Complication of anesthesia    " feel like I'm dying after I wake up"   Diabetes mellitus without complication (HCC)    Diverticulosis    Dysrhythmia    unknown type    Fibromyalgia    Flat back syndrome    GERD (gastroesophageal reflux disease)    Hx of transfusion 2011   Hypertension    Itching    Macular degeneration    Nocturia    Restless leg syndrome    Sleep apnea    Dx 1990's unable to wear c-pap -   Stroke (HCC) 2010   verbal aphasia x 30 min - resolved - no problem since then    Patient Active Problem List   Diagnosis Date Noted   Atrial fibrillation with RVR (HCC) 05/16/2021   Head injury 12/27/2020   SDH (subdural hematoma) (HCC) 12/11/2013   Postoperative wound infection--lumbar spine/2015 12/11/2013   CVA (cerebral infarction)  12/11/2013   Postoperative anemia due to acute blood loss 04/09/2013   Diabetes (HCC) 04/07/2013   Hypertension 04/07/2013   Unspecified sleep apnea 04/07/2013   OA (osteoarthritis) of knee 04/06/2013    Past Surgical History:  Procedure Laterality Date   ABDOMINAL HYSTERECTOMY     rt so   BACK SURGERY  2010 / 2011   BREAST REDUCTION SURGERY     BURR HOLE FOR SUBDURAL HEMATOMA  2015   cataracts removed     CHOLECYSTECTOMY  1990   DILATION AND CURETTAGE OF UTERUS     HERNIA REPAIR     POSTERIOR LAMINECTOMY / DECOMPRESSION LUMBAR SPINE     with T10-S1 fusion   TONSILLECTOMY     TOTAL KNEE ARTHROPLASTY Left 04/06/2013   Procedure: LEFT TOTAL KNEE ARTHROPLASTY;  Surgeon: Loanne DrillingFrank V Aluisio, MD;  Location: [facility];  Service: Orthopedics;  Laterality: Left;    Prior to Admission medications   Medication Sig Start Date End Date Taking? Authorizing Provider  acetaminophen (TYLENOL) 500 MG tablet Take 500-1,000 mg by mouth every 6 (six) hours as needed for mild pain or fever.     [provider]  albuterol (VENTOLIN HFA) 108 (90 Base) MCG/ACT inhaler albuterol sulfate HFA 90 mcg/actuation aerosol inhaler  INHALE 2 PUFFS INTO LUNGS 4  TIMES DAILY 05/19/20   [provider]  aspirin EC 81 MG tablet Take 1 tablet (81 mg total) by mouth daily. MAY RESUME ON 12/31/20 12/31/20   Osvaldo Shipper, MD  carvedilol (COREG) 25 MG tablet Take 25 mg by mouth 2 (two) times daily.     [provider]  doxycycline (VIBRA-TABS) 100 MG tablet Take 100 mg by mouth 2 (two) times daily. 12/21/20   [provider]  ELIQUIS 5 MG TABS tablet Take 1 tablet (5 mg total) by mouth 2 (two) times daily. MAY RESUME ON 12/31/20 12/31/20   Osvaldo Shipper, MD  furosemide (LASIX) 20 MG tablet Take 20 mg by mouth daily.     [provider]  gabapentin (NEURONTIN) 300 MG capsule Take 300 mg by mouth 2 (two) times daily.     [provider]  insulin glargine (LANTUS) 100 UNIT/ML  injection Inject 64 Units into the skin at bedtime.     [provider]  insulin lispro (HUMALOG) 100 UNIT/ML injection Inject 22 Units into the skin 3 (three) times daily with meals.    [provider]  ketoconazole (NIZORAL) 2 % cream Apply topically daily. 12/21/20   [provider]  losartan (COZAAR) 50 MG tablet Take 50 mg by mouth daily. 09/27/19   [provider]  Multiple Vitamin (MULTIVITAMIN WITH MINERALS) TABS tablet Take 1 tablet by mouth daily.    [provider]  multivitamin (RENA-VIT) TABS tablet Take 1 tablet by mouth daily.    [provider]  nystatin cream (MYCOSTATIN) Apply topically 2 (two) times daily. 12/19/20   [provider]  NYSTATIN powder SMARTSIG:Packet(s) Topical Twice Daily 12/21/20   [provider]  omeprazole (PRILOSEC) 40 MG capsule Take 40 mg by mouth daily.     [provider]  PARoxetine (PAXIL) 40 MG tablet Take 40 mg by mouth at bedtime.     [provider]  rosuvastatin (CRESTOR) 5 MG tablet Take 2.5 mg by mouth at bedtime.    [provider]  sitaGLIPtin (JANUVIA) 50 MG tablet Take 50 mg by mouth daily.    [provider]  traZODone (DESYREL) 50 MG tablet Take 50 mg by mouth at bedtime.     [provider]  umeclidinium bromide (INCRUSE ELLIPTA) 62.5 MCG/INH AEPB Inhale 1 puff into the lungs. 05/19/20   [provider]    Allergies Codeine, Dilaudid [hydromorphone], Demerol [meperidine], Pravastatin, Lipitor [atorvastatin], Nifedipine, Statins, Sulfa antibiotics, and Vancomycin  Family History  Problem Relation Age of Onset   COPD Sister        had lung transplant   COPD Brother     Social History Social History   Tobacco Use   Smoking status: Never   Smokeless tobacco: Never  Substance Use Topics   Alcohol use: No    Alcohol/week: 0.0 standard drinks   Drug use: No    Review of Systems  Constitutional: No  fever/chills. Eyes: No visual changes. ENT: No sore throat. Cardiovascular: Positive for chest pain. Negative for pleuritic pain. Negative for palpitations. Negative for leg pain. Respiratory: Positive for shortness of breath. Gastrointestinal: Negative for abdominal pain. Negative for nausea, no vomiting.  No diarrhea.  No constipation. Genitourinary: Negative for dysuria. Musculoskeletal: Negative for back pain.  Skin: Negative for rash, lesion, wound. Neurological: Negative for headaches, focal weakness or numbness. ___________________________________________   PHYSICAL EXAM:  VITAL SIGNS: ED Triage Vitals [05/16/21 1736]  Enc Vitals Group     BP  Pulse      Resp      Temp      Temp src      SpO2 94 %     Weight      Height      Head Circumference      Peak Flow      Pain Score      Pain Loc      Pain Edu?      Excl. in GC?     Constitutional: Alert and oriented. Acutely ill appearing and in no acute distress. Normal mental status. Eyes: Conjunctivae are normal. PERRL. Head: Atraumatic. Nose: No congestion/rhinnorhea. Mouth/Throat: Mucous membranes are moist.  Oropharynx non-erythematous. Tongue normal in size and color. Neck: No stridor. no carotid bruit appreciated on exam. Hematological/Lymphatic/Immunilogical: No cervical lymphadenopathy. Cardiovascular: Normal rate, regular rhythm. Grossly normal heart sounds.  Good peripheral circulation. Respiratory: Normal respiratory effort.  No retractions. Lungs CTAB. Gastrointestinal: Soft and nontender. No distention. No abdominal bruits. No CVA tenderness. Genitourinary: Exam deferred. Musculoskeletal: No lower extremity tenderness. no edema of extremities. Neurologic:  Normal speech and language. No gross focal neurologic deficits are appreciated. Skin:  Skin is warm, dry and intact. No rash noted. Psychiatric: Mood and affect are normal. Speech and behavior are  normal.  ____________________________________________   LABS (all labs ordered are listed, but only abnormal results are displayed)  Labs Reviewed  COMPREHENSIVE METABOLIC PANEL - Abnormal; Notable for the following components:      Result Value   Glucose, Bld 176 (*)    BUN 35 (*)    Creatinine, Ser 1.25 (*)    Albumin 3.4 (*)    GFR, Estimated 44 (*)    All other components within normal limits  RESP PANEL BY RT-PCR (FLU A&B, COVID) ARPGX2  LACTIC ACID, PLASMA  CBC WITH DIFFERENTIAL/PLATELET  LACTIC ACID, PLASMA  URINALYSIS, COMPLETE (UACMP) WITH MICROSCOPIC  BRAIN NATRIURETIC PEPTIDE  CBG MONITORING, ED  TROPONIN I (HIGH SENSITIVITY)  TROPONIN I (HIGH SENSITIVITY)   ____________________________________________  EKG  ED ECG REPORT I, Westlee Devita, FNP-BC personally viewed and interpreted this ECG.   Date: 05/16/2021  EKG Time: 1737  Rate: 144  Rhythm: atrial fibrillation, rate uncontrolled  Axis: normal  Intervals:none  ST&T Change: no   ____________________________________________  RADIOLOGY  ED MD interpretation:   I, Kem Boroughs, personally viewed and evaluated these images (plain radiographs) as part of my medical decision making, as well as reviewing the written report by the radiologist.  Official radiology report(s): DG Chest 1 View  Result Date: 05/16/2021 CLINICAL DATA:  Chest pain and shortness of breath. EXAM: CHEST  1 VIEW COMPARISON:  December 27, 2020 FINDINGS: There is no evidence of acute infiltrate, pleural effusion or pneumothorax. Mild prominence of the perihilar pulmonary vasculature is seen. The cardiac silhouette is borderline in size and unchanged in appearance. Multilevel degenerative changes seen throughout the thoracic spine. Bilateral radiopaque pedicle screws are also noted within the lower thoracic and upper lumbar spine. IMPRESSION: Findings consistent with mild pulmonary vascular congestion. Electronically Signed   By: Aram Candela M.D.   On: 05/16/2021 18:41    ____________________________________________   PROCEDURES  Procedure(s) performed: None  Procedures  Critical Care performed:  yes  ____________________________________________   INITIAL IMPRESSION / ASSESSMENT AND PLAN   80 year old female presenting to the emergency department for chest pain and shortness of breath.  See HPI for further details.  EKG shows A. fib with RVR--likely demand ischemia.  On room air,  the patient was noted to be 88%.  She was placed on 2 L nasal cannula and increased to 94%.  She is not currently on home O2.  Cardizem ordered.  Differential diagnosis includes, but not limited to:  Differential includes, but is not limited to, viral syndrome, bronchitis including COPD exacerbation, reactive airway disease including asthma, CHF including exacerbation with or without pulmonary/interstitial edema, pneumothorax, ACS, thoracic trauma, and pulmonary embolism, ACS, aortic dissection, pulmonary embolism, cardiac tamponade, pneumothorax, pneumonia, pericarditis, myocarditis, GI-related causes including esophagitis/gastritis, and musculoskeletal chest wall pain.    ED COURSE  Cardizem push decreased rate to 120s but soon after rate increased back to the 140s.  Cardizem drip ordered.  Patient reports that her pain has down to 3 out of 10.  ----------------------------------------- 7:13 PM on 05/16/2021 ----------------------------------------- Rate remains 140s. Nursing staff increasing rate to 10mg /hr. Oxygen removed and saturation again decreased to 88% on room air. 2 liters [place] applied. Will also get CTA chest to rule out PE.  CRITICAL CARE Performed by:   Total critical care time: 45 minutes  Critical care time was exclusive of separately billable procedures and treating other patients.  Critical care was necessary to treat or prevent imminent or life-threatening deterioration.  Critical care was time  spent personally by me on the following activities: development of treatment plan with patient and/or surrogate as well as nursing, discussions with consultants, evaluation of patient's response to treatment, examination of patient, obtaining history from patient or surrogate, ordering and performing treatments and interventions, ordering and review of laboratory studies, ordering and review of radiographic studies, pulse oximetry and re-evaluation of patient's condition.     FINAL CLINICAL IMPRESSION(S) / ED DIAGNOSES  Final diagnoses:  Acute respiratory failure with hypoxia (HCC)  Uncontrolled atrial fibrillation [facility])     ED Discharge Orders     None        PAMI WOOL was evaluated in Emergency Department on 05/16/2021 for the symptoms described in the history of present illness. She was evaluated in the context of the global COVID-19 pandemic, which necessitated consideration that the patient might be at risk for infection with the SARS-CoV-2 virus that causes COVID-19. Institutional protocols and algorithms that pertain to the evaluation of patients at risk for COVID-19 are in a state of rapid change based on information released by regulatory bodies including the CDC and federal and state organizations. These policies and algorithms were followed during the patient's care in the ED.   Note:  This document was prepared using Dragon voice recognition software and may include unintentional dictation errors.    05/18/2021, FNP 05/16/21 1945    05/18/21, MD 05/16/21 2239

## 2021-05-16 NOTE — H&P (Addendum)
[place]   PATIENT NAME: Cheyenne Frank    MR#:  [id number]  DATE OF BIRTH:  [birth year]  DATE OF ADMISSION:  05/16/2021  PRIMARY CARE PHYSICIAN: [place]   Patient is coming from: Home  REQUESTING/REFERRING PHYSICIAN: Triplett, Kasandra Knudsen, PA-C  CHIEF COMPLAINT:   Chief Complaint  Patient presents with   Chest Pain    HISTORY OF PRESENT ILLNESS:  Cheyenne Frank is a 80 y.o. Caucasian female with medical history significant for asthma, anxiety, type 2 diabetes mellitus, atrial fibrillation on Eliquis, fibromyalgia, hypertension, sleep apnea, CVA, restless leg syndrome and GERD who presented to the emergency room with acute onset of chest pain that started about an hour and half before arrival.  Her pain has been midsternal with radiation to both jaws with associated palpitations.  She admits to chills without fever.  She had associated dyspnea without nausea or vomiting or diaphoresis.  She denies any leg pain or recent travels or surgeries.  She does have mild lower extremity edema.  She admits to headache without dizziness or blurred vision.  No dysuria, oliguria or hematuria or flank pain. She was given 325 mg of p.o. aspirin by EMS.  ED Course: Upon presentation to the emergency room, blood pressure was 140/124 and heart rate 143 with respiratory rate of 33.  Labs revealed a glucose of 176 and creatinine 1.25 with BUN of 35 compared to 26/1.15 on 12/28/2020.  Lactic acid was 1 CBC was within normal. EKG as reviewed by me : Atrial fibrillation with a ventricular response with a rate of 144 on monitor. An EKG later on showed atrial flutter with 2-1 AV block  with a rate of 138 Imaging: Chest x-ray showed mild pulmonary vascular congestion.  The patient was given IV Cardizem bolus followed by drip.  She will be admitted to the progressive unit bed for further evaluation and management. PAST MEDICAL HISTORY:   Past Medical History:  Diagnosis Date   Anemia  of chronic disease    Anxiety    Arthritis    Asthma    no problems since 1999 (after moving into a new home)   Chronic airway obstruction (HCC)    Complication of anesthesia    " feel like I'm dying after I wake up"   Diabetes mellitus without complication (HCC)    Diverticulosis    Dysrhythmia    unknown type    Fibromyalgia    Flat back syndrome    GERD (gastroesophageal reflux disease)    Hx of transfusion 2011   Hypertension    Itching    Macular degeneration    Nocturia    Restless leg syndrome    Sleep apnea    Dx 1990's unable to wear c-pap -   Stroke (HCC) 2010   verbal aphasia x 30 min - resolved - no problem since then    PAST SURGICAL HISTORY:   Past Surgical History:  Procedure Laterality Date   ABDOMINAL HYSTERECTOMY     rt so   BACK SURGERY  2010 / 2011   BREAST REDUCTION SURGERY     BURR HOLE FOR SUBDURAL HEMATOMA  2015   cataracts removed     CHOLECYSTECTOMY  1990   DILATION AND CURETTAGE OF UTERUS     HERNIA REPAIR     POSTERIOR LAMINECTOMY / DECOMPRESSION LUMBAR SPINE     with T10-S1 fusion   TONSILLECTOMY     TOTAL KNEE ARTHROPLASTY Left 04/06/2013  Procedure: LEFT TOTAL KNEE ARTHROPLASTY;  Surgeon: Loanne Drilling, MD;  Location: [facility];  Service: Orthopedics;  Laterality: Left;    SOCIAL HISTORY:   Social History   Tobacco Use   Smoking status: Never   Smokeless tobacco: Never  Substance Use Topics   Alcohol use: No    Alcohol/week: 0.0 standard drinks    FAMILY HISTORY:   Family History  Problem Relation Age of Onset   COPD Sister        had lung transplant   COPD Brother     DRUG ALLERGIES:   Allergies  Allergen Reactions   Codeine Palpitations    Other Reaction: RACES HEART   Dilaudid [Hydromorphone] Other (See Comments) and Rash    Other Reaction: lightheaded, altered ms, nervous, Paranoia paranoid Other Reaction: lightheaded, altered ms, nervous, Paranoia   Demerol [Meperidine] Other (See Comments) and Rash     agitated Agitation   Pravastatin Other (See Comments)    Muscle aches   Lipitor [Atorvastatin] Other (See Comments) and Itching    unknown   Nifedipine Other (See Comments)    unknown   Statins Rash    Makes her crazy   Sulfa Antibiotics Rash   Vancomycin Rash    deafness    REVIEW OF SYSTEMS:   ROS As per history of present illness. All pertinent systems were reviewed above. Constitutional, HEENT, cardiovascular, respiratory, GI, GU, musculoskeletal, neuro, psychiatric, endocrine, integumentary and hematologic systems were reviewed and are otherwise negative/unremarkable except for positive findings mentioned above in the HPI.   MEDICATIONS AT HOME:   Prior to Admission medications   Medication Sig Start Date End Date Taking? Authorizing Provider  acetaminophen (TYLENOL) 500 MG tablet Take 500-1,000 mg by mouth every 6 (six) hours as needed for mild pain or fever.     [provider]  albuterol (VENTOLIN HFA) 108 (90 Base) MCG/ACT inhaler albuterol sulfate HFA 90 mcg/actuation aerosol inhaler  INHALE 2 PUFFS INTO LUNGS 4 TIMES DAILY 05/19/20   [provider]  aspirin EC 81 MG tablet Take 1 tablet (81 mg total) by mouth daily. MAY RESUME ON 12/31/20 12/31/20   Osvaldo Shipper, MD  carvedilol (COREG) 25 MG tablet Take 25 mg by mouth 2 (two) times daily.     [provider]  doxycycline (VIBRA-TABS) 100 MG tablet Take 100 mg by mouth 2 (two) times daily. 12/21/20   [provider]  ELIQUIS 5 MG TABS tablet Take 1 tablet (5 mg total) by mouth 2 (two) times daily. MAY RESUME ON 12/31/20 12/31/20   Osvaldo Shipper, MD  furosemide (LASIX) 20 MG tablet Take 20 mg by mouth daily.     [provider]  gabapentin (NEURONTIN) 300 MG capsule Take 300 mg by mouth 2 (two) times daily.     [provider]  insulin glargine (LANTUS) 100 UNIT/ML injection Inject 64 Units into the skin at bedtime.     [provider]  insulin lispro (HUMALOG)  100 UNIT/ML injection Inject 22 Units into the skin 3 (three) times daily with meals.    [provider]  ketoconazole (NIZORAL) 2 % cream Apply topically daily. 12/21/20   [provider]  losartan (COZAAR) 50 MG tablet Take 50 mg by mouth daily. 09/27/19   [provider]  Multiple Vitamin (MULTIVITAMIN WITH MINERALS) TABS tablet Take 1 tablet by mouth daily.    [provider]  multivitamin (RENA-VIT) TABS tablet Take 1 tablet by mouth daily.    [provider]  nystatin cream (MYCOSTATIN) Apply topically 2 (two) times daily. 12/19/20   [provider]  NYSTATIN powder SMARTSIG:Packet(s) Topical Twice Daily 12/21/20   [provider]  omeprazole (PRILOSEC) 40 MG capsule Take 40 mg by mouth daily.     [provider]  PARoxetine (PAXIL) 40 MG tablet Take 40 mg by mouth at bedtime.     [provider]  rosuvastatin (CRESTOR) 5 MG tablet Take 2.5 mg by mouth at bedtime.    [provider]  sitaGLIPtin (JANUVIA) 50 MG tablet Take 50 mg by mouth daily.    [provider]  traZODone (DESYREL) 50 MG tablet Take 50 mg by mouth at bedtime.     [provider]  umeclidinium bromide (INCRUSE ELLIPTA) 62.5 MCG/INH AEPB Inhale 1 puff into the lungs. 05/19/20   [provider]      VITAL SIGNS:  Blood pressure (!) 132/91, pulse (!) 130, resp. rate 19, height 5\' 2"  (1.575 m), weight 120.2 kg, SpO2 93 %.  PHYSICAL EXAMINATION:  Physical Exam  GENERAL:  80 y.o.-year-old patient lying in the bed with no acute distress.  EYES: Pupils equal, round, reactive to light and accommodation. No scleral icterus. Extraocular muscles intact.  HEENT: Head atraumatic, normocephalic. Oropharynx and nasopharynx clear.  NECK:  Supple, no jugular venous distention. No thyroid enlargement, no tenderness.  LUNGS: Slightly diminished bibasilar breath sounds with minimal bibasilar rales without rhonchi or wheezes.  No  use of accessory muscles of respiration.  CARDIOVASCULAR: Irregularly irregular tachycardic rhythm, S1, S2 normal. No murmurs, rubs, or gallops.  ABDOMEN: Soft, nondistended, nontender. Bowel sounds present. No organomegaly or mass.  EXTREMITIES: Trace bilateral lower extremity pitting edema with no cyanosis, or clubbing.  NEUROLOGIC: Cranial nerves II through XII are intact. Muscle strength 5/5 in all extremities. Sensation intact. Gait not checked.  PSYCHIATRIC: The patient is alert and oriented x 3.  Normal affect and good eye contact. SKIN: No obvious rash, lesion, or ulcer.   LABORATORY PANEL:   CBC Recent Labs  Lab 05/16/21 1745  WBC 8.3  HGB 14.5  HCT 43.3  PLT 160   ------------------------------------------------------------------------------------------------------------------  Chemistries  Recent Labs  Lab 05/16/21 1745  NA 139  K 4.2  CL 98  CO2 32  GLUCOSE 176*  BUN 35*  CREATININE 1.25*  CALCIUM 9.3  AST 28  ALT 23  ALKPHOS 85  BILITOT 0.8   ------------------------------------------------------------------------------------------------------------------  Cardiac Enzymes No results for input(s): TROPONINI in the last 168 hours. ------------------------------------------------------------------------------------------------------------------  RADIOLOGY:  DG Chest 1 View  Result Date: 05/16/2021 CLINICAL DATA:  Chest pain and shortness of breath. EXAM: CHEST  1 VIEW COMPARISON:  December 27, 2020 FINDINGS: There is no evidence of acute infiltrate, pleural effusion or pneumothorax. Mild prominence of the perihilar pulmonary vasculature is seen. The cardiac silhouette is borderline in size and unchanged in appearance. Multilevel degenerative changes seen throughout the thoracic spine. Bilateral radiopaque pedicle screws are also noted within the lower thoracic and upper lumbar spine. IMPRESSION: Findings consistent with mild pulmonary vascular congestion.  Electronically Signed   By: December 29, 2020 M.D.   On: 05/16/2021 18:41      IMPRESSION AND PLAN:  Active Problems:   Atrial fibrillation with RVR (HCC)  1.  Atrial fibrillation with rapid ventricular response. - The patient will be admitted to a progressive unit bed. - We will continue IV Cardizem drip will be titrated as needed. - With no response to IV Cardizem we will utilize IV  amiodarone bolus and drip. - We will check magnesium level and optimize it. - We will follow serial troponin I's. - We will continue Eliquis. - We will obtain a 2D echo and cardiology consult. - I notified Dr. Darrold JunkerParaschos about the patient.  2.  Chest pain, rule out acute coronary syndrome. - This could be related to rapid atrial fibrillation. - We will follow serial troponin I's and obtain cardiology consult as mentioned above. - We will continue aspirin and beta-blocker therapy.  3.  Mild acute on chronic diastolic CHF with subsequent mild acute likely prerenal kidney injury as well as acute hypoxic respiratory failure. - The patient will be gently diuresed with IV Lasix and will follow BMP. - O2 protocol will be followed.  4.  Type II diabetes mellitus. - The patient be placed on supplement coverage with NovoLog and will continue basal coverage. - We will continue Tradjenta.  5.  Depression. - We will continue Paxil.  6.  Essential hypertension. - We will continue Coreg and hold off Cozaar.  DVT prophylaxis: Eliquis.   Code Status: full code. Family Communication:  The plan of care was discussed in details with the patient (and family). I answered all questions. The patient agreed to proceed with the above mentioned plan. Further management will depend upon hospital course. Disposition Plan: Back to previous home environment Consults called: Cardiology. All the records are reviewed and case discussed with ED provider.  Status is: Inpatient  Remains inpatient appropriate because:Ongoing  diagnostic testing needed not appropriate for outpatient work up, Unsafe d/c plan, IV treatments appropriate due to intensity of illness or inability to take PO, and Inpatient level of care appropriate due to severity of illness  Dispo: The patient is from: Home              Anticipated d/c is to: Home              Patient currently is not medically stable to d/c.   Difficult to place patient No   TOTAL TIME TAKING CARE OF THIS PATIENT: 55 minutes.    Hannah BeatJan A Sansa Alkema M.D on 05/16/2021 at 7:52 PM  [facility]   From 7 PM-7 AM, contact night-coverage [email or web address]  CC: Primary care physician; [facility] [place]

## 2021-05-16 NOTE — ED Notes (Signed)
Pt denies CP but states jaw tightness.

## 2021-05-16 NOTE — ED Notes (Signed)
Pt moved over to hospital bed, labs drawn. Pt stood and transferred to new bed. Pt A&O x4 and call light and diet ginger ale given.

## 2021-05-16 NOTE — ED Triage Notes (Signed)
Pt arrives via [facility].  Reports CP in upper chest radiating into jaw starting 1.5 hours ago (3pm.) Current pain reported as 5-6.  Pt states she took 324 aspirin before EMS arrived w/no relief. Reports SHOB w/no O2 used at home.   Hx of Afib w/RVR 145-150 per EMS.Abdominal distension noted by EMS which pt says is her normal.

## 2021-05-17 ENCOUNTER — Inpatient Hospital Stay (HOSPITAL_COMMUNITY)
Admit: 2021-05-17 | Discharge: 2021-05-17 | Disposition: A | Discharge status: Home / Self Care | Payer: Medicare Other | Attending: Family Medicine | Admitting: Family Medicine

## 2021-05-17 ENCOUNTER — Encounter: Payer: Self-pay | Admitting: Family Medicine

## 2021-05-17 DIAGNOSIS — I4891 Unspecified atrial fibrillation: Secondary | ICD-10-CM | POA: Diagnosis not present

## 2021-05-17 DIAGNOSIS — E119 Type 2 diabetes mellitus without complications: Secondary | ICD-10-CM

## 2021-05-17 DIAGNOSIS — I5031 Acute diastolic (congestive) heart failure: Secondary | ICD-10-CM

## 2021-05-17 DIAGNOSIS — Z794 Long term (current) use of insulin: Secondary | ICD-10-CM

## 2021-05-17 LAB — CBC
HCT: 40.2 % (ref 36.0–46.0)
Hemoglobin: 13.5 g/dL (ref 12.0–15.0)
MCH: 30.9 pg (ref 26.0–34.0)
MCHC: 33.6 g/dL (ref 30.0–36.0)
MCV: 92 fL (ref 80.0–100.0)
Platelets: 143 10*3/uL — ABNORMAL LOW (ref 150–400)
RBC: 4.37 MIL/uL (ref 3.87–5.11)
RDW: 14.2 % (ref 11.5–15.5)
WBC: 7.2 10*3/uL (ref 4.0–10.5)
nRBC: 0 % (ref 0.0–0.2)

## 2021-05-17 LAB — ECHOCARDIOGRAM COMPLETE
Height: 62 in
S' Lateral: 2.5 cm
Weight: 4238.4 oz

## 2021-05-17 LAB — HEMOGLOBIN A1C
Hgb A1c MFr Bld: 8.2 % — ABNORMAL HIGH (ref 4.8–5.6)
Mean Plasma Glucose: 189 mg/dL

## 2021-05-17 LAB — RENAL FUNCTION PANEL
Albumin: 3.8 g/dL (ref 3.5–5.0)
Anion gap: 10 (ref 5–15)
BUN: 40 mg/dL — ABNORMAL HIGH (ref 8–23)
CO2: 31 mmol/L (ref 22–32)
Calcium: 8.8 mg/dL — ABNORMAL LOW (ref 8.9–10.3)
Chloride: 97 mmol/L — ABNORMAL LOW (ref 98–111)
Creatinine, Ser: 1.93 mg/dL — ABNORMAL HIGH (ref 0.44–1.00)
GFR, Estimated: 26 mL/min — ABNORMAL LOW (ref >60)
Glucose, Bld: 181 mg/dL — ABNORMAL HIGH (ref 70–99)
Phosphorus: 5.4 mg/dL — ABNORMAL HIGH (ref 2.5–4.6)
Potassium: 3.9 mmol/L (ref 3.5–5.1)
Sodium: 138 mmol/L (ref 135–145)

## 2021-05-17 LAB — BASIC METABOLIC PANEL
Anion gap: 8 (ref 5–15)
BUN: 38 mg/dL — ABNORMAL HIGH (ref 8–23)
CO2: 32 mmol/L (ref 22–32)
Calcium: 8.8 mg/dL — ABNORMAL LOW (ref 8.9–10.3)
Chloride: 98 mmol/L (ref 98–111)
Creatinine, Ser: 1.48 mg/dL — ABNORMAL HIGH (ref 0.44–1.00)
GFR, Estimated: 36 mL/min — ABNORMAL LOW (ref >60)
Glucose, Bld: 163 mg/dL — ABNORMAL HIGH (ref 70–99)
Potassium: 4.2 mmol/L (ref 3.5–5.1)
Sodium: 138 mmol/L (ref 135–145)

## 2021-05-17 LAB — TROPONIN I (HIGH SENSITIVITY)
Troponin I (High Sensitivity): 72 ng/L — ABNORMAL HIGH (ref <18)
Troponin I (High Sensitivity): 67 ng/L — ABNORMAL HIGH (ref <18)
Troponin I (High Sensitivity): 65 ng/L — ABNORMAL HIGH (ref <18)

## 2021-05-17 LAB — GLUCOSE, CAPILLARY
Glucose-Capillary: 242 mg/dL — ABNORMAL HIGH (ref 70–99)
Glucose-Capillary: 166 mg/dL — ABNORMAL HIGH (ref 70–99)
Glucose-Capillary: 180 mg/dL — ABNORMAL HIGH (ref 70–99)

## 2021-05-17 LAB — CBG MONITORING, ED: Glucose-Capillary: 144 mg/dL — ABNORMAL HIGH (ref 70–99)

## 2021-05-17 MED ORDER — DIGOXIN 0.25 MG/ML IJ SOLN
0.2500 mg | Freq: Once | INTRAMUSCULAR | Status: AC
  Administered 2021-05-17: 0.25 mg via INTRAVENOUS
  Filled 2021-05-17: qty 2

## 2021-05-17 MED ORDER — ALBUMIN HUMAN 25 % IV SOLN
50.0000 g | Freq: Once | INTRAVENOUS | Status: AC
  Administered 2021-05-17: 50 g via INTRAVENOUS
  Filled 2021-05-17: qty 200

## 2021-05-17 MED ORDER — ALBUMIN HUMAN 25 % IV SOLN
25.0000 g | Freq: Four times a day (QID) | INTRAVENOUS | Status: DC
  Administered 2021-05-17 (×2): 25 g via INTRAVENOUS
  Filled 2021-05-17 (×3): qty 100

## 2021-05-17 MED ORDER — FUROSEMIDE 10 MG/ML IJ SOLN
10.0000 mg/h | INTRAVENOUS | Status: DC
  Administered 2021-05-17 (×2): 10 mg/h via INTRAVENOUS
  Filled 2021-05-17 (×2): qty 20

## 2021-05-17 MED ORDER — ALBUMIN HUMAN 25 % IV SOLN: 25.0000 g | Freq: Once | INTRAVENOUS | Status: DC

## 2021-05-17 MED ORDER — MAGNESIUM SULFATE 2 GM/50ML IV SOLN
2.0000 g | Freq: Once | INTRAVENOUS | Status: AC
  Administered 2021-05-17: 2 g via INTRAVENOUS
  Filled 2021-05-17: qty 50

## 2021-05-17 MED ORDER — AMIODARONE HCL 200 MG PO TABS
200.0000 mg | ORAL_TABLET | Freq: Two times a day (BID) | ORAL | Status: DC
  Administered 2021-05-17 – 2021-05-22 (×11): 200 mg via ORAL
  Filled 2021-05-17 (×12): qty 1

## 2021-05-17 MED ORDER — CARVEDILOL 6.25 MG PO TABS
6.2500 mg | ORAL_TABLET | Freq: Two times a day (BID) | ORAL | Status: DC
  Administered 2021-05-17 – 2021-05-19 (×4): 6.25 mg via ORAL
  Filled 2021-05-17 (×4): qty 1

## 2021-05-17 NOTE — ED Notes (Signed)
Respiratory called for pt to be placed on CPAP at this time.   Pharmacy called for Lasix.

## 2021-05-17 NOTE — Progress Notes (Signed)
*  PRELIMINARY RESULTS* Echocardiogram 2D Echocardiogram has been performed.  Cristela Blue 05/17/2021, 9:56 AM

## 2021-05-17 NOTE — Progress Notes (Addendum)
Cross Cover Worsening renal function.  Albumin now normal level and BP in good range. Lasix and albumin stopped for now.  Reevaluate renal function in am

## 2021-05-17 NOTE — Progress Notes (Signed)
PROGRESS NOTE    Cheyenne Frank  AES:[id number] DOB: [birth year] DOA: 05/16/2021 PCP: [facility]    Chief Complaint  Patient presents with   Chest Pain    Brief Narrative:  Patient has a history of essential hypertension, sleep apnea, prior CVA, and paroxysmal atrial fibrillation on Eliquis for stroke prevention.  She presents to [facility] ED with chest pain, noted to be in atrial fibrillation with rapid ventricular rate  She is started on amiodarone drip and lasix drip on admission , she is also started on iv albumin overnight Cardiology consulted ,  Subjective:   Sitting up , on room air, denies chest pain,  Bilateral lower extremity trace pitting edema reports no significant changes from yesterdcay Daughter at bedside She has osa but not able to tolerate cpap  Assessment & Plan:   Active Problems:   Atrial fibrillation with RVR (HCC)   Afib/RVR -heart rate improved -continue currently regiment -follow cardiology recommendation  Acute heart failure exacerbation  With edema, currently on lasix drip Update echo, result pending Follow cards recommendation, cardiology input appreciated  Bp low, decrease coreg from 25mg  bid to 6.25bid with holding parameters D/c cozaar  AKI on ckd IIIb Likely cardiorenal Will monitor cr while on lasix drip Avoid hypotension D/c cozaar  Insulin-dependent type 2 diabetes Blood glucose stable on current regimen A1c pending collection  Class III obesity/OSA: Body mass index is 48.19 kg/m. Report not able to tolerate CPAP at night     Unresulted Labs (From admission, onward)     Start     Ordered   05/17/21 0500  Basic metabolic panel  Daily,   STAT      05/16/21 2201   05/17/21 0500  Hemoglobin A1c  Once,   R        05/17/21 0500              DVT prophylaxis:  apixaban (ELIQUIS) tablet 5 mg   Code Status: Full Family Communication: Daughter at bedside Disposition:   Status is:  Inpatient   Dispo: The patient is from: Home              Anticipated d/c is to: Home              Anticipated d/c date is: To be determined              Patient currently will need cards clearance and PT eval prior to discharge   Consultants:  Cardiology  Procedures:  None  Antimicrobials:   Anti-infectives (From admission, onward)    None           Objective: Vitals:   05/17/21 0730 05/17/21 0839 05/17/21 1133 05/17/21 1146  BP: (!) 120/54 (!) 120/59 (!) 95/59 (!) 96/52  Pulse: 70 72 66   Resp: 19 17 17    Temp:  98.1 F (36.7 C) 98 F (36.7 C)   TempSrc:   Oral   SpO2: 100% 98% 98%   Weight:  119.5 kg    Height:  5\' 2"  (1.575 m)      Intake/Output Summary (Last 24 hours) at 05/17/2021 1310 Last data filed at 05/17/2021 1136 Gross per 24 hour  Intake --  Output 1250 ml  Net -1250 ml   Filed Weights   05/16/21 1743 05/17/21 0839  Weight: 120.2 kg 119.5 kg    Examination:  General exam: calm, NAD Respiratory system:  Diminished at bases, no rales, no rhonchi no wheezing, respiratory effort normal. Cardiovascular  system: S1 & S2 heard, IRRR.  Gastrointestinal system: Abdomen is nondistended, soft and nontender. N Normal bowel sounds heard. Central nervous system: Alert and oriented. No focal neurological deficits. Extremities: Bilateral lower extremity pitting edema Skin: No rashes, lesions or ulcers Psychiatry: Judgement and insight appear normal. Mood & affect appropriate.     Data Reviewed: I have personally reviewed following labs and imaging studies  CBC: Recent Labs  Lab 05/16/21 1745 05/17/21 0518  WBC 8.3 7.2  NEUTROABS 5.5  --   HGB 14.5 13.5  HCT 43.3 40.2  MCV 92.3 92.0  PLT 160 143*    Basic Metabolic Panel: Recent Labs  Lab 05/16/21 1745 05/16/21 2007 05/17/21 0518  NA 139  --  138  K 4.2  --  4.2  CL 98  --  98  CO2 32  --  32  GLUCOSE 176*  --  163*  BUN 35*  --  38*  CREATININE 1.25*  --  1.48*  CALCIUM 9.3  --   8.8*  MG  --  1.9  --     GFR: Estimated Creatinine Clearance: 37.9 mL/min (A) (by C-G formula based on SCr of 1.48 mg/dL (H)).  Liver Function Tests: Recent Labs  Lab 05/16/21 1745  AST 28  ALT 23  ALKPHOS 85  BILITOT 0.8  PROT 6.9  ALBUMIN 3.4*    CBG: Recent Labs  Lab 05/16/21 2231 05/17/21 0715 05/17/21 1133  GLUCAP 141* 144* 242*     Recent Results (from the past 240 hour(s))  Resp Panel by RT-PCR (Flu A&B, Covid) Nasopharyngeal Swab     Status: None   Collection Time: 05/16/21  8:07 PM   Specimen: Nasopharyngeal Swab; Nasopharyngeal(NP) swabs in vial transport medium  Result Value Ref Range Status   SARS Coronavirus 2 by RT PCR NEGATIVE NEGATIVE Final    Comment: (NOTE) SARS-CoV-2 target nucleic acids are NOT DETECTED.  The SARS-CoV-2 RNA is generally detectable in upper respiratory specimens during the acute phase of infection. The lowest concentration of SARS-CoV-2 viral copies this assay can detect is 138 copies/mL. A negative result does not preclude SARS-Cov-2 infection and should not be used as the sole basis for treatment or other patient management decisions. A negative result may occur with  improper specimen collection/handling, submission of specimen other than nasopharyngeal swab, presence of viral mutation(s) within the areas targeted by this assay, and inadequate number of viral copies(<138 copies/mL). A negative result must be combined with clinical observations, patient history, and epidemiological information. The expected result is Negative.  Fact Sheet for Patients:  BloggerCourse.com  Fact Sheet for Healthcare Providers:  SeriousBroker.it  This test is no t yet approved or cleared by the [place] FDA and  has been authorized for detection and/or diagnosis of SARS-CoV-2 by FDA under an Emergency Use Authorization (EUA). This EUA will remain  in effect (meaning this test can be  used) for the duration of the COVID-19 declaration under Section 564(b)(1) of the Act, 21 U.S.C.section 360bbb-3(b)(1), unless the authorization is terminated  or revoked sooner.       Influenza A by PCR NEGATIVE NEGATIVE Final   Influenza B by PCR NEGATIVE NEGATIVE Final    Comment: (NOTE) The Xpert Xpress SARS-CoV-2/FLU/RSV plus assay is intended as an aid in the diagnosis of influenza from Nasopharyngeal swab specimens and should not be used as a sole basis for treatment. Nasal washings and aspirates are unacceptable for Xpert Xpress SARS-CoV-2/FLU/RSV testing.  Fact Sheet for Patients: BloggerCourse.com  Fact Sheet for Healthcare Providers: SeriousBroker.it  This test is not yet approved or cleared by the [place] FDA and has been authorized for detection and/or diagnosis of SARS-CoV-2 by FDA under an Emergency Use Authorization (EUA). This EUA will remain in effect (meaning this test can be used) for the duration of the COVID-19 declaration under Section 564(b)(1) of the Act, 21 U.S.C. section 360bbb-3(b)(1), unless the authorization is terminated or revoked.  Performed at [facility], [address], [place], [place] [address]          Radiology Studies: DG Chest 1 View  Result Date: 05/16/2021 CLINICAL DATA:  Chest pain and shortness of breath. EXAM: CHEST  1 VIEW COMPARISON:  December 27, 2020 FINDINGS: There is no evidence of acute infiltrate, pleural effusion or pneumothorax. Mild prominence of the perihilar pulmonary vasculature is seen. The cardiac silhouette is borderline in size and unchanged in appearance. Multilevel degenerative changes seen throughout the thoracic spine. Bilateral radiopaque pedicle screws are also noted within the lower thoracic and upper lumbar spine. IMPRESSION: Findings consistent with mild pulmonary vascular congestion. Electronically Signed   By: Aram Candela M.D.   On:  05/16/2021 18:41   CT Angio Chest PE W and/or Wo Contrast  Result Date: 05/16/2021 CLINICAL DATA:  Chest pain and shortness of breath EXAM: CT ANGIOGRAPHY CHEST WITH CONTRAST TECHNIQUE: Multidetector CT imaging of the chest was performed using the standard protocol during bolus administration of intravenous contrast. Multiplanar CT image reconstructions and MIPs were obtained to evaluate the vascular anatomy. CONTRAST:  13mL OMNIPAQUE IOHEXOL 350 MG/ML SOLN COMPARISON:  Chest radiograph dated 05/16/2021. FINDINGS: Cardiovascular: There is no cardiomegaly or pericardial effusion there is mild atherosclerotic calcification of the thoracic aorta. No aneurysmal dilatation. No pulmonary artery embolus identified. Mediastinum/Nodes: There is no hilar adenopathy. Mildly enlarged rounded mediastinal lymph node to the right of the esophagus measures 12 mm (133/5). The esophagus and the thyroid gland are grossly unremarkable. No mediastinal fluid collection. Lungs/Pleura: The lungs are clear. There is no pleural effusion pneumothorax. The central airways are patent Upper Abdomen: Cholecystectomy. Musculoskeletal: Degenerative changes of the spine and osteopenia. No acute osseous pathology. Lower thoracic posterior fusion. Multiple old healed right rib fractures. Review of the MIP images confirms the above findings. IMPRESSION: 1. No acute intrathoracic pathology. No CT evidence of pulmonary artery embolus. 2. Aortic Atherosclerosis (ICD10-I70.0). Electronically Signed   By: Elgie Collard M.D.   On: 05/16/2021 20:28        Scheduled Meds:  amiodarone  200 mg Oral BID   apixaban  5 mg Oral BID   aspirin EC  81 mg Oral Daily   carvedilol  6.25 mg Oral BID   gabapentin  300 mg Oral BID   insulin aspart  0-15 Units Subcutaneous TID AC & HS   insulin glargine-yfgn  64 Units Subcutaneous QHS   linagliptin  5 mg Oral Daily   multivitamin  1 tablet Oral Daily   multivitamin with minerals  1 tablet Oral Daily    pantoprazole  40 mg Oral Daily   PARoxetine  40 mg Oral QHS   rosuvastatin  2.5 mg Oral QHS   Continuous Infusions:  albumin human 25 g (05/17/21 1054)   diltiazem (CARDIZEM) infusion Stopped (05/17/21 0415)   furosemide (LASIX) 200 mg in dextrose 5% 100 mL (2mg /mL) infusion 10 mg/hr (05/17/21 0814)     LOS: 1 day   Time spent: 05/19/21 Greater than 50% of this time was spent in counseling, explanation  of diagnosis, planning of further management, and coordination of care.   Voice Recognition Reubin Milan/Dragon dictation system was used to create this note, attempts have been made to correct errors. Please contact the author with questions and/or clarifications.   Albertine GratesFang Naomi Castrogiovanni, MD PhD FACP [facility]  Available via Epic secure chat 7am-7pm for nonurgent issues Please page for urgent issues To page the attending provider between 7A-7P or the covering provider during after hours 7P-7A, please log into the web site [email or web address] and access using universal [place] password for that web site. If you do not have the password, please call the hospital operator.    05/17/2021, 1:10 PM

## 2021-05-17 NOTE — ED Notes (Signed)
Pt requesting to take break from CPAP. Pt educated on the importance of and encouraged to have back in place within the next hour. Pt consents to have CPAP back on at 0700. Pt on nasal cannula at 4L until.

## 2021-05-17 NOTE — ED Notes (Signed)
Pt repositioned in hospital bed with new bed pads in place due to soiled pads from urine. Pt cleaned, brief in place and new external catheter in place.

## 2021-05-17 NOTE — Progress Notes (Signed)
Cross Cover Due to low pressures and AKI with hypoalbuminemia on presentation, and low output from bolus lasix dosing, lasix changed to drip, albumin ordered, and CPAP added for history of OSA

## 2021-05-17 NOTE — Consult Note (Signed)
[facility] Card[facility]  CARDIOLOGY CONSULT NOTE  Patient ID: Cheyenne Frank MRN: [id number] DOB/AGE: 1940/12/02 80 y.o.  Admit date: 05/16/2021 Referring Physician Roda Shutters Primary Physician [facility], [facility] Cardiologist [facility] Reason for Consultation atrial fibrillation with rapid ventricular rate  HPI: 80 year old female referred for atrial fibrillation with rapid ventricular rate.  Patient has a history of essential hypertension, sleep apnea, prior CVA, and paroxysmal atrial fibrillation on Eliquis for stroke prevention.  She presents to [facility] ED with chest pain, noted to be in atrial fibrillation with rapid ventricular rate.  ECG revealed atrial fibrillation at a rate of 144 bpm.  Follow-up ECG revealed atrial flutter at a rate of 138 bpm.  Patient initially treated with Cardizem bolus and drip with minimal effect.  Patient treated with amiodarone bolus and drip, with subsequent good rate control and conversion to sinus rhythm.  Patient currently denies chest pain.  ECG did not reveal any ischemic ST-T wave changes.  High-sensitivity troponin was mildly elevated 13, 25, 72.  Review of systems complete and found to be negative unless listed above     Past Medical History:  Diagnosis Date   Anemia of chronic disease    Anxiety    Arthritis    Asthma    no problems since 1999 (after moving into a new home)   Chronic airway obstruction (HCC)    Complication of anesthesia    " feel like I'm dying after I wake up"   Diabetes mellitus without complication (HCC)    Diverticulosis    Dysrhythmia    unknown type    Fibromyalgia    Flat back syndrome    GERD (gastroesophageal reflux disease)    Hx of transfusion 2011   Hypertension    Itching    Macular degeneration    Nocturia    Restless leg syndrome    Sleep apnea    Dx 1990's unable to wear c-pap -   Stroke (HCC) 2010   verbal aphasia x 30 min - resolved - no problem since then    Past Surgical History:   Procedure Laterality Date   ABDOMINAL HYSTERECTOMY     rt so   BACK SURGERY  2010 / 2011   BREAST REDUCTION SURGERY     BURR HOLE FOR SUBDURAL HEMATOMA  2015   cataracts removed     CHOLECYSTECTOMY  1990   DILATION AND CURETTAGE OF UTERUS     HERNIA REPAIR     POSTERIOR LAMINECTOMY / DECOMPRESSION LUMBAR SPINE     with T10-S1 fusion   TONSILLECTOMY     TOTAL KNEE ARTHROPLASTY Left 04/06/2013   Procedure: LEFT TOTAL KNEE ARTHROPLASTY;  Surgeon: Loanne Drilling, MD;  Location: [facility] OR[facility];  Service: Orthopedics;  Laterality: Left;    (Not in a hospital admission)  Social History   Socioeconomic History   Marital status: Single    Spouse name: Not on file   Number of children: Not on file   Years of education: Not on file   Highest education level: Not on file  Occupational History   Not on file  Tobacco Use   Smoking status: Never   Smokeless tobacco: Never  Substance and Sexual Activity   Alcohol use: No    Alcohol/week: 0.0 standard drinks   Drug use: No   Sexual activity: Not Currently  Other Topics Concern   Not on file  Social History Narrative   Not on file   Social Determinants of Health  Financial Resource Strain: Not on file  Food Insecurity: Not on file  Transportation Needs: Not on file  Physical Activity: Not on file  Stress: Not on file  Social Connections: Not on file  Intimate Partner Violence: Not on file    Family History  Problem Relation Age of Onset   COPD Sister        had lung transplant   COPD Brother       Review of systems complete and found to be negative unless listed above      PHYSICAL EXAM  General: Well developed, well nourished, in no acute distress HEENT:  Normocephalic and atramatic Neck:  No JVD.  Lungs: Clear bilaterally to auscultation and percussion. Heart: HRRR . Normal S1 and S2 without gallops or murmurs.  Abdomen: Bowel sounds are positive, abdomen soft and non-tender  Msk:  Back normal, normal gait. Normal  strength and tone for age. Extremities: No clubbing, cyanosis or edema.   Neuro: Alert and oriented X 3. Psych:  Good affect, responds appropriately  Labs:   Lab Results  Component Value Date   WBC 7.2 05/17/2021   HGB 13.5 05/17/2021   HCT 40.2 05/17/2021   MCV 92.0 05/17/2021   PLT 143 (L) 05/17/2021    Recent Labs  Lab 05/16/21 1745 05/17/21 0518  NA 139 138  K 4.2 4.2  CL 98 98  CO2 32 32  BUN 35* 38*  CREATININE 1.25* 1.48*  CALCIUM 9.3 8.8*  PROT 6.9  --   BILITOT 0.8  --   ALKPHOS 85  --   ALT 23  --   AST 28  --   GLUCOSE 176* 163*   Lab Results  Component Value Date   CKTOTAL 16 11/11/2013   CKMB 1.3 11/11/2013   TROPONINI <0.03 05/12/2015   No results found for: CHOL No results found for: HDL No results found for: LDLCALC No results found for: TRIG No results found for: CHOLHDL No results found for: LDLDIRECT    Radiology: DG Chest 1 View  Result Date: 05/16/2021 CLINICAL DATA:  Chest pain and shortness of breath. EXAM: CHEST  1 VIEW COMPARISON:  December 27, 2020 FINDINGS: There is no evidence of acute infiltrate, pleural effusion or pneumothorax. Mild prominence of the perihilar pulmonary vasculature is seen. The cardiac silhouette is borderline in size and unchanged in appearance. Multilevel degenerative changes seen throughout the thoracic spine. Bilateral radiopaque pedicle screws are also noted within the lower thoracic and upper lumbar spine. IMPRESSION: Findings consistent with mild pulmonary vascular congestion. Electronically Signed   By: Aram Candela M.D.   On: 05/16/2021 18:41   CT Angio Chest PE W and/or Wo Contrast  Result Date: 05/16/2021 CLINICAL DATA:  Chest pain and shortness of breath EXAM: CT ANGIOGRAPHY CHEST WITH CONTRAST TECHNIQUE: Multidetector CT imaging of the chest was performed using the standard protocol during bolus administration of intravenous contrast. Multiplanar CT image reconstructions and MIPs were obtained to  evaluate the vascular anatomy. CONTRAST:  59mL OMNIPAQUE IOHEXOL 350 MG/ML SOLN COMPARISON:  Chest radiograph dated 05/16/2021. FINDINGS: Cardiovascular: There is no cardiomegaly or pericardial effusion there is mild atherosclerotic calcification of the thoracic aorta. No aneurysmal dilatation. No pulmonary artery embolus identified. Mediastinum/Nodes: There is no hilar adenopathy. Mildly enlarged rounded mediastinal lymph node to the right of the esophagus measures 12 mm (133/5). The esophagus and the thyroid gland are grossly unremarkable. No mediastinal fluid collection. Lungs/Pleura: The lungs are clear. There is no pleural effusion pneumothorax. The central  airways are patent Upper Abdomen: Cholecystectomy. Musculoskeletal: Degenerative changes of the spine and osteopenia. No acute osseous pathology. Lower thoracic posterior fusion. Multiple old healed right rib fractures. Review of the MIP images confirms the above findings. IMPRESSION: 1. No acute intrathoracic pathology. No CT evidence of pulmonary artery embolus. 2. Aortic Atherosclerosis (ICD10-I70.0). Electronically Signed   By: Elgie Collard M.D.   On: 05/16/2021 20:28    EKG: Atrial fibrillation at a rate of 144 bpm  ASSESSMENT AND PLAN:   1.  Atrial fibrillation with rapid ventricular rate, and patient with known paroxysmal atrial fibrillation on Eliquis for stroke prevention, converted to sinus rhythm on amiodarone bolus and drip, currently chest pain-free, clinically much improved without chest pain, shortness of breath or palpitation 2.  Mildly elevated troponin (13, 25, 72 ), very likely demand supply ischemia secondary to atrial fibrillation with rapid ventricular rate 3.  Essential hypertension, on carvedilol 4.  Hyperlipidemia, on rosuvastatin 5.  Type 2 diabetes, on insulin  Recommendations  1.  Agree with current plan 2.  Transition amiodarone infusion to amiodarone 200 mg p.o. twice daily 3.  Continue carvedilol 25 mg twice  daily 4.  Continue Eliquis for stroke prevention 5.  Defer full dose anticoagulation 6.  Review 2D echocardiogram  Signed: Marcina Millard MD,PhD, [facility] 05/17/2021, 8:06 AM

## 2021-05-18 ENCOUNTER — Inpatient Hospital Stay: Payer: Medicare Other

## 2021-05-18 LAB — GLUCOSE, CAPILLARY
Glucose-Capillary: 107 mg/dL — ABNORMAL HIGH (ref 70–99)
Glucose-Capillary: 186 mg/dL — ABNORMAL HIGH (ref 70–99)
Glucose-Capillary: 204 mg/dL — ABNORMAL HIGH (ref 70–99)
Glucose-Capillary: 203 mg/dL — ABNORMAL HIGH (ref 70–99)

## 2021-05-18 LAB — TSH: TSH: 1.584 u[IU]/mL (ref 0.350–4.500)

## 2021-05-18 LAB — BASIC METABOLIC PANEL
Anion gap: 7 (ref 5–15)
BUN: 42 mg/dL — ABNORMAL HIGH (ref 8–23)
CO2: 37 mmol/L — ABNORMAL HIGH (ref 22–32)
Calcium: 9.1 mg/dL (ref 8.9–10.3)
Chloride: 97 mmol/L — ABNORMAL LOW (ref 98–111)
Creatinine, Ser: 1.76 mg/dL — ABNORMAL HIGH (ref 0.44–1.00)
GFR, Estimated: 29 mL/min — ABNORMAL LOW (ref >60)
Glucose, Bld: 122 mg/dL — ABNORMAL HIGH (ref 70–99)
Potassium: 4 mmol/L (ref 3.5–5.1)
Sodium: 141 mmol/L (ref 135–145)

## 2021-05-18 LAB — MAGNESIUM: Magnesium: 2.3 mg/dL (ref 1.7–2.4)

## 2021-05-18 LAB — HEMOGLOBIN A1C
Hgb A1c MFr Bld: 8.2 % — ABNORMAL HIGH (ref 4.8–5.6)
Mean Plasma Glucose: 188.64 mg/dL

## 2021-05-18 NOTE — Evaluation (Signed)
Physical Therapy Evaluation Patient Details Name: Cheyenne Frank MRN: [id number] DOB: June 17, 1941 Today's Date: 05/18/2021   History of Present Illness  Pt is a 80 y.o. F presenting w/ chest pain and a-fib w/ RVR  and history significant for HTN, OSA, prior CVA, paroxysmal a-fib, DM, Anemia.  Clinical Impression  Pt alert, oriented x 3, pleasant throughout treatment. Pt notes significant hx of falls particularly at night sitting on toilet. PLOF requires assistance by her daughter for cooking, bathing, ascending stairs to enter the home, ambulates w/ RW for short household distances.   Pt education provided on hx of fall, utilizing BSC to decrease fall risk. Pt 94% on room air, seated EOB, destating to low 80s following transfers or taking small steps, rebounds back to low 90s seated w/ cues for breathing technique. Pt required mod-A supine > sit transfer for trunk elevation, min-A x 1 sit<>stand and min-guard x 2 w/ consistent cues for hand placement w/ RW; ambulated a few steps, standing marching w/ RW w/o LOB, but slight SOB noted following activity. Due to lack of home support, significant falls hx, and decreased endurance SNF is primary recommendation at d/c. Skilled PT intervention is indicated to address deficits in function, mobility, and to return to PLOF as able.      Follow Up Recommendations SNF;Supervision for mobility/OOB    Equipment Recommendations  None recommended by PT    Recommendations for Other Services       Precautions / Restrictions Precautions Precautions: Fall Restrictions Weight Bearing Restrictions: No      Mobility  Bed Mobility Overal bed mobility: Needs Assistance Bed Mobility: Supine to Sit     Supine to sit: Mod assist;HOB elevated     General bed mobility comments: Physical assist for trunk elevation    Transfers Overall transfer level: Needs assistance Equipment used: Rolling walker (2 wheeled) Transfers: Sit to/from Stand Sit to Stand: Min  assist         General transfer comment: Sit <> stand: Min-A x 1, min-gaurd x 2; cues for hand placement w/ RW  Ambulation/Gait Ambulation/Gait assistance: Min guard Gait Distance (Feet): 2 Feet Assistive device: Rolling walker (2 wheeled) Gait Pattern/deviations: Decreased step length - right;Decreased step length - left;Trunk flexed     General Gait Details: Decreased O2 (low 80s) following bed > recliner transfer, slight SOB  Stairs            Wheelchair Mobility    Modified Rankin (Stroke Patients Only)       Balance Overall balance assessment: Needs assistance Sitting-balance support: Feet supported;Bilateral upper extremity supported Sitting balance-Leahy Scale: Good       Standing balance-Leahy Scale: Fair Standing balance comment: Able to stand dynamically and statically w/ BUE support                             Pertinent Vitals/Pain Pain Assessment: No/denies pain    Home Living Family/patient expects to be discharged to:: Private residence Living Arrangements: Children (daughter, son-in-law) Available Help at Discharge: Family;Available PRN/intermittently Type of Home: House Home Access: Stairs to enter Entrance Stairs-Rails: Right Entrance Stairs-Number of Steps: 5 Home Layout: Two level;Able to live on main level with bedroom/bathroom Home Equipment: Dan Humphreys - 2 wheels;Bedside commode;Shower seat - built in      Prior Function Level of Independence: Needs assistance   Gait / Transfers Assistance Needed: ambulates household distances only w/ 2- RW; family assists w/ stairs to enter the  home  ADL's / Homemaking Assistance Needed: Daughter assists w/ bathing, cooking, driving to appointments  Comments: Pt reports significant hx of falls esp during the night walking to restroom, falling asleep on toilet     Hand Dominance        Extremity/Trunk Assessment   Upper Extremity Assessment Upper Extremity Assessment: Generalized  weakness    Lower Extremity Assessment Lower Extremity Assessment: Generalized weakness    Cervical / Trunk Assessment Cervical / Trunk Assessment: Kyphotic  Communication   Communication: No difficulties  Cognition Arousal/Alertness: Awake/alert Behavior During Therapy: WFL for tasks assessed/performed Overall Cognitive Status: Within Functional Limits for tasks assessed                                 General Comments: AOx4      General Comments      Exercises Other Exercises Other Exercises: Standing marching, RW, min-gaurd x 15 s O2 decreased high 70s, low 80s   Assessment/Plan    PT Assessment Patient needs continued PT services  PT Problem List Decreased strength;Decreased range of motion;Decreased activity tolerance;Decreased balance;Decreased mobility       PT Treatment Interventions Balance training;Gait training;Stair training;Functional mobility training;Therapeutic activities;Therapeutic exercise;Neuromuscular re-education    PT Goals (Current goals can be found in the Care Plan section)  Acute Rehab PT Goals Patient Stated Goal: To go home PT Goal Formulation: With patient Time For Goal Achievement: 06/01/21 Potential to Achieve Goals: Fair    Frequency Min 2X/week   Barriers to discharge        Co-evaluation               AM-PAC PT "6 Clicks" Mobility  Outcome Measure Help needed turning from your back to your side while in a flat bed without using bedrails?: A Little Help needed moving from lying on your back to sitting on the side of a flat bed without using bedrails?: A Lot Help needed moving to and from a bed to a chair (including a wheelchair)?: A Little Help needed standing up from a chair using your arms (e.g., wheelchair or bedside chair)?: A Little Help needed to walk in hospital room?: A Lot Help needed climbing 3-5 steps with a railing? : A Lot 6 Click Score: 15    End of Session Equipment Utilized During  Treatment: Gait belt Activity Tolerance: Patient tolerated treatment well Patient left: in chair;with family/visitor present;with call bell/phone within reach;with chair alarm set Nurse Communication: Mobility status PT Visit Diagnosis: Muscle weakness (generalized) (M62.81);History of falling (Z91.81);Repeated falls (R29.6);Difficulty in walking, not elsewhere classified (R26.2)    Time: 9604-5409 PT Time Calculation (min) (ACUTE ONLY): 48 min   Charges:             [employer], SPT

## 2021-05-18 NOTE — Progress Notes (Signed)
[facility] Cardiology  SUBJECTIVE: Patient laying flat in bed, denies chest pain   Vitals:   05/17/21 2347 05/18/21 0000 05/18/21 0435 05/18/21 0745  BP: (!) 144/54  (!) 142/54 (!) 142/80  Pulse: 77  78 73  Resp: 18 15  17   Temp: 97.8 F (36.6 C)  97.9 F (36.6 C) 98.2 F (36.8 C)  TempSrc:   Oral Oral  SpO2: 100%  93% 97%  Weight:   117.9 kg   Height:         Intake/Output Summary (Last 24 hours) at 05/18/2021 07/18/2021 Last data filed at 05/18/2021 0749 Gross per 24 hour  Intake 1031.16 ml  Output 3825 ml  Net -2793.84 ml      PHYSICAL EXAM  General: Well developed, well nourished, in no acute distress HEENT:  Normocephalic and atramatic Neck:  No JVD.  Lungs: Clear bilaterally to auscultation and percussion. Heart: HRRR . Normal S1 and S2 without gallops or murmurs.  Abdomen: Bowel sounds are positive, abdomen soft and non-tender  Msk:  Back normal, normal gait. Normal strength and tone for age. Extremities: No clubbing, cyanosis or edema.   Neuro: Alert and oriented X 3. Psych:  Good affect, responds appropriately   LABS: Basic Metabolic Panel: Recent Labs    05/16/21 2007 05/17/21 0518 05/17/21 1528 05/18/21 0504  NA  --    < > 138 141  K  --    < > 3.9 4.0  CL  --    < > 97* 97*  CO2  --    < > 31 37*  GLUCOSE  --    < > 181* 122*  BUN  --    < > 40* 42*  CREATININE  --    < > 1.93* 1.76*  CALCIUM  --    < > 8.8* 9.1  MG 1.9  --   --  2.3  PHOS  --   --  5.4*  --    < > = values in this interval not displayed.   Liver Function Tests: Recent Labs    05/16/21 1745 05/17/21 1528  AST 28  --   ALT 23  --   ALKPHOS 85  --   BILITOT 0.8  --   PROT 6.9  --   ALBUMIN 3.4* 3.8   No results for input(s): LIPASE, AMYLASE in the last 72 hours. CBC: Recent Labs    05/16/21 1745 05/17/21 0518  WBC 8.3 7.2  NEUTROABS 5.5  --   HGB 14.5 13.5  HCT 43.3 40.2  MCV 92.3 92.0  PLT 160 143*   Cardiac Enzymes: No results for input(s): CKTOTAL, CKMB, CKMBINDEX,  TROPONINI in the last 72 hours. BNP: Invalid input(s): POCBNP D-Dimer: No results for input(s): DDIMER in the last 72 hours. Hemoglobin A1C: Recent Labs    05/17/21 0518  HGBA1C 8.2*   Fasting Lipid Panel: No results for input(s): CHOL, HDL, LDLCALC, TRIG, CHOLHDL, LDLDIRECT in the last 72 hours. Thyroid Function Tests: Recent Labs    05/18/21 0504  TSH 1.584   Anemia Panel: No results for input(s): VITAMINB12, FOLATE, FERRITIN, TIBC, IRON, RETICCTPCT in the last 72 hours.  DG Chest 1 View  Result Date: 05/16/2021 CLINICAL DATA:  Chest pain and shortness of breath. EXAM: CHEST  1 VIEW COMPARISON:  December 27, 2020 FINDINGS: There is no evidence of acute infiltrate, pleural effusion or pneumothorax. Mild prominence of the perihilar pulmonary vasculature is seen. The cardiac silhouette is borderline in size and unchanged  in appearance. Multilevel degenerative changes seen throughout the thoracic spine. Bilateral radiopaque pedicle screws are also noted within the lower thoracic and upper lumbar spine. IMPRESSION: Findings consistent with mild pulmonary vascular congestion. Electronically Signed   By: Aram Candela M.D.   On: 05/16/2021 18:41   CT Angio Chest PE W and/or Wo Contrast  Result Date: 05/16/2021 CLINICAL DATA:  Chest pain and shortness of breath EXAM: CT ANGIOGRAPHY CHEST WITH CONTRAST TECHNIQUE: Multidetector CT imaging of the chest was performed using the standard protocol during bolus administration of intravenous contrast. Multiplanar CT image reconstructions and MIPs were obtained to evaluate the vascular anatomy. CONTRAST:  72mL OMNIPAQUE IOHEXOL 350 MG/ML SOLN COMPARISON:  Chest radiograph dated 05/16/2021. FINDINGS: Cardiovascular: There is no cardiomegaly or pericardial effusion there is mild atherosclerotic calcification of the thoracic aorta. No aneurysmal dilatation. No pulmonary artery embolus identified. Mediastinum/Nodes: There is no hilar adenopathy. Mildly  enlarged rounded mediastinal lymph node to the right of the esophagus measures 12 mm (133/5). The esophagus and the thyroid gland are grossly unremarkable. No mediastinal fluid collection. Lungs/Pleura: The lungs are clear. There is no pleural effusion pneumothorax. The central airways are patent Upper Abdomen: Cholecystectomy. Musculoskeletal: Degenerative changes of the spine and osteopenia. No acute osseous pathology. Lower thoracic posterior fusion. Multiple old healed right rib fractures. Review of the MIP images confirms the above findings. IMPRESSION: 1. No acute intrathoracic pathology. No CT evidence of pulmonary artery embolus. 2. Aortic Atherosclerosis (ICD10-I70.0). Electronically Signed   By: Elgie Collard M.D.   On: 05/16/2021 20:28   ECHOCARDIOGRAM COMPLETE  Result Date: 05/17/2021    ECHOCARDIOGRAM REPORT   Patient Name:   Cheyenne Frank Date of Exam: 05/17/2021 Medical Rec #:  [id number]       Height:       62.0 in Accession #:    [id number]      Weight:       264.9 lb Date of Birth:  [birth year]        BSA:          2.154 m Patient Age:    80 years        BP:           120/59 mmHg Patient Gender: F               HR:           72 bpm. Exam Location:  [facility] Procedure: 2D Echo, Color Doppler and Cardiac Doppler Indications:     Atrial Fibrillation I48.91                  CHF-acute diastolic I50.31  History:         Patient has no prior history of Echocardiogram examinations.                  Stroke; Risk Factors:Diabetes and Sleep Apnea.  Sonographer:     Cristela Blue Referring Phys:  [id number] JAN A MANSY Diagnosing Phys: Cheyenne Odea MD  Sonographer Comments: No apical window and Technically challenging study due to limited acoustic windows. IMPRESSIONS  1. Left ventricular ejection fraction, by estimation, is 55 to 60%. The left ventricle has normal function. The left ventricle has no regional wall motion abnormalities. There is mild left ventricular hypertrophy. Left ventricular diastolic  function could not be evaluated.  2. Right ventricular systolic function is normal. The right ventricular size is normal.  3. The mitral valve is grossly normal. No evidence of mitral valve regurgitation. Moderate  mitral annular calcification.  4. The aortic valve is grossly normal. Aortic valve regurgitation is not visualized. FINDINGS  Left Ventricle: Left ventricular ejection fraction, by estimation, is 55 to 60%. The left ventricle has normal function. The left ventricle has no regional wall motion abnormalities. The left ventricular internal cavity size was normal in size. There is  mild left ventricular hypertrophy. Left ventricular diastolic function could not be evaluated. Right Ventricle: The right ventricular size is normal. No increase in right ventricular wall thickness. Right ventricular systolic function is normal. Left Atrium: Left atrial size was normal in size. Right Atrium: Right atrial size was not well visualized. Pericardium: There is no evidence of pericardial effusion. Mitral Valve: The mitral valve is grossly normal. Moderate mitral annular calcification. No evidence of mitral valve regurgitation. Tricuspid Valve: The tricuspid valve is normal in structure. Tricuspid valve regurgitation is not demonstrated. Aortic Valve: The aortic valve is grossly normal. Aortic valve regurgitation is not visualized. Pulmonic Valve: The pulmonic valve was not well visualized. Pulmonic valve regurgitation is not visualized. Aorta: The aortic root is normal in size and structure. Venous: The inferior vena cava was not well visualized. IAS/Shunts: No atrial level shunt detected by color flow Doppler.  LEFT VENTRICLE PLAX 2D LVIDd:         3.60 cm LVIDs:         2.50 cm LV PW:         1.70 cm LV IVS:        1.80 cm LVOT diam:     2.00 cm LVOT Area:     3.14 cm  LEFT ATRIUM         Index LA diam:    3.90 cm 1.81 cm/m                        PULMONIC VALVE AORTA                 PV Vmax:          1.17 m/s Ao Root  diam: 3.13 cm PV Peak grad:     5.5 mmHg                       PR End Diast Vel: 2.74 msec                       RVOT Peak grad:   6 mmHg   SHUNTS Systemic Diam: 2.00 cm Cheyenne OdeaBrian Agbor-Etang MD Electronically signed by Cheyenne OdeaBrian Agbor-Etang MD Signature Date/Time: 05/17/2021/2:28:37 PM    Final      Echo LVEF 55 to 60%  TELEMETRY: Sinus rhythm 81 bpm:  ASSESSMENT AND PLAN:  Active Problems:   Atrial fibrillation with RVR (HCC)    1. Atrial fibrillation with rapid ventricular rate, and patient with known paroxysmal atrial fibrillation on Eliquis for stroke prevention, converted to sinus rhythm on amiodarone bolus and drip, currently chest pain-free, clinically much improved without chest pain, shortness of breath or palpitation 2.  Mildly elevated troponin (13, 25, 72 ), very likely demand supply ischemia secondary to atrial fibrillation with rapid ventricular rate 3.  Essential hypertension, on carvedilol 4.  Hyperlipidemia, on rosuvastatin 5.  Type 2 diabetes, on insulin 6.  Chronic kidney disease   Recommendations   1.  Agree with current plan 2.  Continue amiodarone 200 mg p.o. twice daily 3.  Continue carvedilol 25 mg twice daily 4.  Continue Eliquis for  stroke prevention 5.  Defer full dose anticoagulation 6.  Review 2D echocardiogram 7.  Follow-up with me in 1 to 2-week after discharge at which time we will have her amiodarone to 200 mg daily   Marcina Millard, MD, PhD, [facility] 05/18/2021 9:09 AM

## 2021-05-18 NOTE — Progress Notes (Signed)
PROGRESS NOTE    Cheyenne Frank  RDE:[id number] DOB: 08-Oct-1940 DOA: 05/16/2021 PCP: [facility]    Chief Complaint  Patient presents with   Chest Pain    Brief Narrative:  Patient has a history of essential hypertension, sleep apnea, prior CVA, and paroxysmal atrial fibrillation on Eliquis for stroke prevention.  She presents to [facility] ED with chest pain, noted to be in atrial fibrillation with rapid ventricular rate  She is started on amiodarone drip and lasix drip on admission , she is also started on iv albumin overnight Cardiology consulted ,  Subjective:  She is hard of hearing, report feeling better Sitting up , on room air, denies chest pain,  She has audible wheezing, occasionally dry cough, no fever Bilateral lower extremity trace pitting edema has resolved this morning  daughter at bedside She has osa but not able to tolerate cpap  Assessment & Plan:   Active Problems:   Atrial fibrillation with RVR (HCC)   Afib/RVR -heart rate improved, currently sinus rhythm -continue currently regiment -follow cardiology recommendation  Acute heart failure exacerbation  lasix drip discontinued due to worsening renal function, lower extremity edema has resolved Update echo with preserved left ventricular EF Follow cards recommendation, cardiology input appreciated  Has audible wheezing today Suspect upper airway sound Chest x-ray ordered  Bp low, decrease coreg from 25mg  bid to 6.25bid with holding parameters D/c cozaar  AKI on ckd IIIb Likely cardiorenal lasix drip discontinued Avoid hypotension D/c cozaar Monitor BMP  Insulin-dependent type 2 diabetes, uncontrolled Blood glucose stable on current regimen A1c 8.2  Class III obesity/OSA: Body mass index is 47.55 kg/m. Report not able to tolerate CPAP at night  Failure to thrive, Pt eval    Unresulted Labs (From admission, onward)     Start     Ordered   05/17/21 0500  Basic  metabolic panel  Daily,   STAT      05/16/21 2201              DVT prophylaxis:  apixaban (ELIQUIS) tablet 5 mg   Code Status: Full Family Communication: Daughter at bedside Disposition:   Status is: Inpatient   Dispo: The patient is from: Home              Anticipated d/c is to: home vs SNF              Anticipated d/c date is: To be determined, 24-48hrs              Patient currently will need cards clearance and PT eval prior to discharge   Consultants:  Cardiology  Procedures:  None  Antimicrobials:   Anti-infectives (From admission, onward)    None           Objective: Vitals:   05/18/21 0000 05/18/21 0435 05/18/21 0745 05/18/21 1159  BP:  (!) 142/54 (!) 142/80 (!) 143/48  Pulse:  78 73 76  Resp: 15  17 18   Temp:  97.9 F (36.6 C) 98.2 F (36.8 C) 98.4 F (36.9 C)  TempSrc:  Oral Oral Oral  SpO2:  93% 97% 92%  Weight:  117.9 kg    Height:        Intake/Output Summary (Last 24 hours) at 05/18/2021 1242 Last data filed at 05/18/2021 1159 Gross per 24 hour  Intake 1271.16 ml  Output 4475 ml  Net -3203.84 ml   Filed Weights   05/16/21 1743 05/17/21 0839 05/18/21 0435  Weight: 120.2 kg  119.5 kg 117.9 kg    Examination:  General exam: calm, NAD, hard of hearing Respiratory system: Bilateral diffuse wheezing , suspect upper airway sound , no rales, no rhonchi, respiratory effort normal. Cardiovascular system: S1 & S2 heard, RRR.  Gastrointestinal system: Abdomen is nondistended, soft and nontender. Normal bowel sounds heard. Central nervous system: Alert and oriented. No focal neurological deficits. Extremities: Bilateral lower extremity pitting edema has resolved Skin: No rashes, lesions or ulcers Psychiatry: Judgement and insight appear normal. Mood & affect appropriate.     Data Reviewed: I have personally reviewed following labs and imaging studies  CBC: Recent Labs  Lab 05/16/21 1745 05/17/21 0518  WBC 8.3 7.2  NEUTROABS 5.5   --   HGB 14.5 13.5  HCT 43.3 40.2  MCV 92.3 92.0  PLT 160 143*    Basic Metabolic Panel: Recent Labs  Lab 05/16/21 1745 05/16/21 2007 05/17/21 0518 05/17/21 1528 05/18/21 0504  NA 139  --  138 138 141  K 4.2  --  4.2 3.9 4.0  CL 98  --  98 97* 97*  CO2 32  --  32 31 37*  GLUCOSE 176*  --  163* 181* 122*  BUN 35*  --  38* 40* 42*  CREATININE 1.25*  --  1.48* 1.93* 1.76*  CALCIUM 9.3  --  8.8* 8.8* 9.1  MG  --  1.9  --   --  2.3  PHOS  --   --   --  5.4*  --     GFR: Estimated Creatinine Clearance: 31.6 mL/min (A) (by C-G formula based on SCr of 1.76 mg/dL (H)).  Liver Function Tests: Recent Labs  Lab 05/16/21 1745 05/17/21 1528  AST 28  --   ALT 23  --   ALKPHOS 85  --   BILITOT 0.8  --   PROT 6.9  --   ALBUMIN 3.4* 3.8    CBG: Recent Labs  Lab 05/17/21 1133 05/17/21 1616 05/17/21 2228 05/18/21 0746 05/18/21 1159  GLUCAP 242* 166* 180* 107* 186*     Recent Results (from the past 240 hour(s))  Resp Panel by RT-PCR (Flu A&B, Covid) Nasopharyngeal Swab     Status: None   Collection Time: 05/16/21  8:07 PM   Specimen: Nasopharyngeal Swab; Nasopharyngeal(NP) swabs in vial transport medium  Result Value Ref Range Status   SARS Coronavirus 2 by RT PCR NEGATIVE NEGATIVE Final    Comment: (NOTE) SARS-CoV-2 target nucleic acids are NOT DETECTED.  The SARS-CoV-2 RNA is generally detectable in upper respiratory specimens during the acute phase of infection. The lowest concentration of SARS-CoV-2 viral copies this assay can detect is 138 copies/mL. A negative result does not preclude SARS-Cov-2 infection and should not be used as the sole basis for treatment or other patient management decisions. A negative result may occur with  improper specimen collection/handling, submission of specimen other than nasopharyngeal swab, presence of viral mutation(s) within the areas targeted by this assay, and inadequate number of viral copies(<138 copies/mL). A negative  result must be combined with clinical observations, patient history, and epidemiological information. The expected result is Negative.  Fact Sheet for Patients:  BloggerCourse.comhttps://www.fda.gov/media/152166/download  Fact Sheet for Healthcare Providers:  SeriousBroker.ithttps://www.fda.gov/media/152162/download  This test is no t yet approved or cleared by the [place] FDA and  has been authorized for detection and/or diagnosis of SARS-CoV-2 by FDA under an Emergency Use Authorization (EUA). This EUA will remain  in effect (meaning this test can be used) for the  duration of the COVID-19 declaration under Section 564(b)(1) of the Act, 21 U.S.C.section 360bbb-3(b)(1), unless the authorization is terminated  or revoked sooner.       Influenza A by PCR NEGATIVE NEGATIVE Final   Influenza B by PCR NEGATIVE NEGATIVE Final    Comment: (NOTE) The Xpert Xpress SARS-CoV-2/FLU/RSV plus assay is intended as an aid in the diagnosis of influenza from Nasopharyngeal swab specimens and should not be used as a sole basis for treatment. Nasal washings and aspirates are unacceptable for Xpert Xpress SARS-CoV-2/FLU/RSV testing.  Fact Sheet for Patients: BloggerCourse.com  Fact Sheet for Healthcare Providers: SeriousBroker.it  This test is not yet approved or cleared by the [place] FDA and has been authorized for detection and/or diagnosis of SARS-CoV-2 by FDA under an Emergency Use Authorization (EUA). This EUA will remain in effect (meaning this test can be used) for the duration of the COVID-19 declaration under Section 564(b)(1) of the Act, 21 U.S.C. section 360bbb-3(b)(1), unless the authorization is terminated or revoked.  Performed at [facility], [address], [place], [place] [address]          Radiology Studies: DG Chest 1 View  Result Date: 05/16/2021 CLINICAL DATA:  Chest pain and shortness of breath. EXAM: CHEST  1 VIEW  COMPARISON:  December 27, 2020 FINDINGS: There is no evidence of acute infiltrate, pleural effusion or pneumothorax. Mild prominence of the perihilar pulmonary vasculature is seen. The cardiac silhouette is borderline in size and unchanged in appearance. Multilevel degenerative changes seen throughout the thoracic spine. Bilateral radiopaque pedicle screws are also noted within the lower thoracic and upper lumbar spine. IMPRESSION: Findings consistent with mild pulmonary vascular congestion. Electronically Signed   By: Aram Candela M.D.   On: 05/16/2021 18:41   CT Angio Chest PE W and/or Wo Contrast  Result Date: 05/16/2021 CLINICAL DATA:  Chest pain and shortness of breath EXAM: CT ANGIOGRAPHY CHEST WITH CONTRAST TECHNIQUE: Multidetector CT imaging of the chest was performed using the standard protocol during bolus administration of intravenous contrast. Multiplanar CT image reconstructions and MIPs were obtained to evaluate the vascular anatomy. CONTRAST:  74mL OMNIPAQUE IOHEXOL 350 MG/ML SOLN COMPARISON:  Chest radiograph dated 05/16/2021. FINDINGS: Cardiovascular: There is no cardiomegaly or pericardial effusion there is mild atherosclerotic calcification of the thoracic aorta. No aneurysmal dilatation. No pulmonary artery embolus identified. Mediastinum/Nodes: There is no hilar adenopathy. Mildly enlarged rounded mediastinal lymph node to the right of the esophagus measures 12 mm (133/5). The esophagus and the thyroid gland are grossly unremarkable. No mediastinal fluid collection. Lungs/Pleura: The lungs are clear. There is no pleural effusion pneumothorax. The central airways are patent Upper Abdomen: Cholecystectomy. Musculoskeletal: Degenerative changes of the spine and osteopenia. No acute osseous pathology. Lower thoracic posterior fusion. Multiple old healed right rib fractures. Review of the MIP images confirms the above findings. IMPRESSION: 1. No acute intrathoracic pathology. No CT evidence of  pulmonary artery embolus. 2. Aortic Atherosclerosis (ICD10-I70.0). Electronically Signed   By: Elgie Collard M.D.   On: 05/16/2021 20:28   ECHOCARDIOGRAM COMPLETE  Result Date: 05/17/2021    ECHOCARDIOGRAM REPORT   Patient Name:   Cheyenne Frank Date of Exam: 05/17/2021 Medical Rec #:  [id number]       Height:       62.0 in Accession #:    [id number]      Weight:       264.9 lb Date of Birth:  Dec 30, 1940        BSA:  2.154 m Patient Age:    80 years        BP:           120/59 mmHg Patient Gender: F               HR:           72 bpm. Exam Location:  [facility] Procedure: 2D Echo, Color Doppler and Cardiac Doppler Indications:     Atrial Fibrillation I48.91                  CHF-acute diastolic I50.31  History:         Patient has no prior history of Echocardiogram examinations.                  Stroke; Risk Factors:Diabetes and Sleep Apnea.  Sonographer:     Cristela Blue Referring Phys:  [id number] JAN A MANSY Diagnosing Phys: Debbe Odea MD  Sonographer Comments: No apical window and Technically challenging study due to limited acoustic windows. IMPRESSIONS  1. Left ventricular ejection fraction, by estimation, is 55 to 60%. The left ventricle has normal function. The left ventricle has no regional wall motion abnormalities. There is mild left ventricular hypertrophy. Left ventricular diastolic function could not be evaluated.  2. Right ventricular systolic function is normal. The right ventricular size is normal.  3. The mitral valve is grossly normal. No evidence of mitral valve regurgitation. Moderate mitral annular calcification.  4. The aortic valve is grossly normal. Aortic valve regurgitation is not visualized. FINDINGS  Left Ventricle: Left ventricular ejection fraction, by estimation, is 55 to 60%. The left ventricle has normal function. The left ventricle has no regional wall motion abnormalities. The left ventricular internal cavity size was normal in size. There is  mild left ventricular  hypertrophy. Left ventricular diastolic function could not be evaluated. Right Ventricle: The right ventricular size is normal. No increase in right ventricular wall thickness. Right ventricular systolic function is normal. Left Atrium: Left atrial size was normal in size. Right Atrium: Right atrial size was not well visualized. Pericardium: There is no evidence of pericardial effusion. Mitral Valve: The mitral valve is grossly normal. Moderate mitral annular calcification. No evidence of mitral valve regurgitation. Tricuspid Valve: The tricuspid valve is normal in structure. Tricuspid valve regurgitation is not demonstrated. Aortic Valve: The aortic valve is grossly normal. Aortic valve regurgitation is not visualized. Pulmonic Valve: The pulmonic valve was not well visualized. Pulmonic valve regurgitation is not visualized. Aorta: The aortic root is normal in size and structure. Venous: The inferior vena cava was not well visualized. IAS/Shunts: No atrial level shunt detected by color flow Doppler.  LEFT VENTRICLE PLAX 2D LVIDd:         3.60 cm LVIDs:         2.50 cm LV PW:         1.70 cm LV IVS:        1.80 cm LVOT diam:     2.00 cm LVOT Area:     3.14 cm  LEFT ATRIUM         Index LA diam:    3.90 cm 1.81 cm/m                        PULMONIC VALVE AORTA                 PV Vmax:          1.17 m/s Ao Root  diam: 3.13 cm PV Peak grad:     5.5 mmHg                       PR End Diast Vel: 2.74 msec                       RVOT Peak grad:   6 mmHg   SHUNTS Systemic Diam: 2.00 cm Debbe Odea MD Electronically signed by Debbe Odea MD Signature Date/Time: 05/17/2021/2:28:37 PM    Final         Scheduled Meds:  amiodarone  200 mg Oral BID   apixaban  5 mg Oral BID   aspirin EC  81 mg Oral Daily   carvedilol  6.25 mg Oral BID   gabapentin  300 mg Oral BID   insulin aspart  0-15 Units Subcutaneous TID AC & HS   insulin glargine-yfgn  64 Units Subcutaneous QHS   linagliptin  5 mg Oral Daily    multivitamin  1 tablet Oral Daily   multivitamin with minerals  1 tablet Oral Daily   pantoprazole  40 mg Oral Daily   PARoxetine  40 mg Oral QHS   rosuvastatin  2.5 mg Oral QHS   Continuous Infusions:     LOS: 2 days   Time spent: Greater than 50% of this time was spent in counseling, explanation of diagnosis, planning of further management, and coordination of care.   Voice Recognition Reubin Milan dictation system was used to create this note, attempts have been made to correct errors. Please contact the author with questions and/or clarifications.   Albertine Grates, MD PhD FACP [facility]  Available via Epic secure chat 7am-7pm for nonurgent issues Please page for urgent issues To page the attending provider between 7A-7P or the covering provider during after hours 7P-7A, please log into the web site [email or web address] and access using universal [place] password for that web site. If you do not have the password, please call the hospital operator.    05/18/2021, 12:42 PM

## 2021-05-19 DIAGNOSIS — N179 Acute kidney failure, unspecified: Secondary | ICD-10-CM

## 2021-05-19 DIAGNOSIS — I5031 Acute diastolic (congestive) heart failure: Secondary | ICD-10-CM

## 2021-05-19 LAB — GLUCOSE, CAPILLARY
Glucose-Capillary: 149 mg/dL — ABNORMAL HIGH (ref 70–99)
Glucose-Capillary: 207 mg/dL — ABNORMAL HIGH (ref 70–99)
Glucose-Capillary: 209 mg/dL — ABNORMAL HIGH (ref 70–99)
Glucose-Capillary: 238 mg/dL — ABNORMAL HIGH (ref 70–99)
Glucose-Capillary: 257 mg/dL — ABNORMAL HIGH (ref 70–99)

## 2021-05-19 LAB — BASIC METABOLIC PANEL
Anion gap: 7 (ref 5–15)
BUN: 39 mg/dL — ABNORMAL HIGH (ref 8–23)
CO2: 35 mmol/L — ABNORMAL HIGH (ref 22–32)
Calcium: 9.4 mg/dL (ref 8.9–10.3)
Chloride: 99 mmol/L (ref 98–111)
Creatinine, Ser: 1.62 mg/dL — ABNORMAL HIGH (ref 0.44–1.00)
GFR, Estimated: 32 mL/min — ABNORMAL LOW (ref >60)
Glucose, Bld: 147 mg/dL — ABNORMAL HIGH (ref 70–99)
Potassium: 4.2 mmol/L (ref 3.5–5.1)
Sodium: 141 mmol/L (ref 135–145)

## 2021-05-19 MED ORDER — CARVEDILOL 25 MG PO TABS: 25.0000 mg | ORAL_TABLET | Freq: Two times a day (BID) | ORAL | Status: DC

## 2021-05-19 MED ORDER — CARVEDILOL 25 MG PO TABS
25.0000 mg | ORAL_TABLET | Freq: Two times a day (BID) | ORAL | Status: DC
  Administered 2021-05-20 – 2021-05-22 (×6): 25 mg via ORAL
  Filled 2021-05-19 (×6): qty 1

## 2021-05-19 MED ORDER — HYDRALAZINE HCL 10 MG PO TABS
10.0000 mg | ORAL_TABLET | Freq: Three times a day (TID) | ORAL | Status: DC | PRN
  Filled 2021-05-19: qty 1

## 2021-05-19 MED ORDER — CARVEDILOL 12.5 MG PO TABS: 12.5000 mg | ORAL_TABLET | Freq: Two times a day (BID) | ORAL | Status: DC

## 2021-05-19 NOTE — [place] FL2 (Signed)
[place] MEDICAID FL2 LEVEL OF CARE SCREENING TOOL     IDENTIFICATION  Patient Name: Cheyenne Frank Birthdate: 07-25-1941 Sex: female Admission Date (Current Location): 05/16/2021  [place] and [place] Number:  Chiropodist and Address:  [facility], [address], [place], [place] [address]      Provider Number: [id number]  Attending Physician Name and Address:  Albertine Grates, MD  Relative Name and Phone Number:       Current Level of Care: Hospital Recommended Level of Care: Skilled Nursing Facility Prior Approval Number:    Date Approved/Denied:   PASRR Number: [id number]  Discharge Plan: SNF    Current Diagnoses: Patient Active Problem List   Diagnosis Date Noted   Acute diastolic CHF (congestive heart failure) (HCC)    AKI (acute kidney injury) (HCC)    Obesity, Class III, BMI 40-49.9 (morbid obesity) (HCC)    Atrial fibrillation with RVR (HCC) 05/16/2021   Head injury 12/27/2020   SDH (subdural hematoma) (HCC) 12/11/2013   Postoperative wound infection--lumbar spine/2015 12/11/2013   CVA (cerebral infarction) 12/11/2013   Postoperative anemia due to acute blood loss 04/09/2013   Insulin dependent type 2 diabetes mellitus (HCC) 04/07/2013   Hypertension 04/07/2013   Unspecified sleep apnea 04/07/2013   OA (osteoarthritis) of knee 04/06/2013    Orientation RESPIRATION BLADDER Height & Weight     Self, Time, Situation, Place  Normal Incontinent Weight: 259 lb 11.2 oz (117.8 kg) Height:  5\' 2"  (157.5 cm)  BEHAVIORAL SYMPTOMS/MOOD NEUROLOGICAL BOWEL NUTRITION STATUS      Continent Diet (heart healthy/carb modified, thin liquids)  AMBULATORY STATUS COMMUNICATION OF NEEDS Skin   Limited Assist Verbally Normal                       Personal Care Assistance Level of Assistance  Dressing, Bathing, Feeding Bathing Assistance: Limited assistance Feeding assistance: Independent Dressing Assistance: Limited assistance      Functional Limitations Info  Sight, Hearing, Speech Sight Info: Adequate Hearing Info: Adequate Speech Info: Adequate    SPECIAL CARE FACTORS FREQUENCY  PT (By licensed PT), OT (By licensed OT)     PT Frequency: 5x OT Frequency: 5x            Contractures Contractures Info: Not present    Additional Factors Info  Code Status, Allergies Code Status Info: full code Allergies Info: Codeine, Dilaudid (Hydromorphone), Demerol (Meperidine), Pravastatin, Lipitor (Atorvastatin), Nifedipine, Statins, Sulfa Antibiotics, Vancomycin           Current Medications (05/19/2021):  This is the current hospital active medication list Current Facility-Administered Medications  Medication Dose Route Frequency Provider Last Rate Last Admin   acetaminophen (TYLENOL) tablet 650 mg  650 mg Oral Q6H PRN Mansy, Jan A, MD   650 mg at 05/18/21 1109   Or   acetaminophen (TYLENOL) suppository 650 mg  650 mg Rectal Q6H PRN Mansy, Jan A, MD       albuterol (PROVENTIL) (2.5 MG/3ML) 0.083% nebulizer solution 2.5 mg  2.5 mg Nebulization Q4H PRN Mansy, Jan A, MD   2.5 mg at 05/18/21 1314   amiodarone (PACERONE) tablet 200 mg  200 mg Oral BID Paraschos, Alexander, MD   200 mg at 05/19/21 07/19/21   apixaban (ELIQUIS) tablet 5 mg  5 mg Oral BID Mansy, Jan A, MD   5 mg at 05/19/21 07/19/21   aspirin EC tablet 81 mg  81 mg Oral Daily Mansy, [id number], MD  81 mg at 05/19/21 4982   carvedilol (COREG) tablet 12.5 mg  12.5 mg Oral BID Albertine Grates, MD       gabapentin (NEURONTIN) capsule 300 mg  300 mg Oral BID Manuela Schwartz, NP   300 mg at 05/19/21 6415   insulin aspart (novoLOG) injection 0-15 Units  0-15 Units Subcutaneous TID [facility] Mansy, Jan A, MD   8 Units at 05/19/21 1124   insulin glargine-yfgn [facility]) injection 64 Units  64 Units Subcutaneous QHS Mansy, Jan A, MD   64 Units at 05/18/21 2136   linagliptin (TRADJENTA) tablet 5 mg  5 mg Oral Daily Mansy, Jan A, MD   5 mg at 05/19/21 8309   liver oil-zinc oxide  (DESITIN) 40 % ointment 1 application  1 application Topical PRN Mansy, Jan A, MD       magnesium hydroxide (MILK OF MAGNESIA) suspension 30 mL  30 mL Oral Daily PRN Mansy, Jan A, MD       multivitamin (RENA-VIT) tablet 1 tablet  1 tablet Oral Daily Mansy, Jan A, MD   1 tablet at 05/19/21 4076   multivitamin with minerals tablet 1 tablet  1 tablet Oral Daily Mansy, Jan A, MD   1 tablet at 05/19/21 0823   ondansetron [facility]) tablet 4 mg  4 mg Oral Q6H PRN Mansy, Jan A, MD       Or   ondansetron [facility]) injection 4 mg  4 mg Intravenous Q6H PRN Mansy, Jan A, MD       pantoprazole (PROTONIX) EC tablet 40 mg  40 mg Oral Daily Mansy, Jan A, MD   40 mg at 05/19/21 8088   PARoxetine (PAXIL) tablet 40 mg  40 mg Oral QHS Mansy, Jan A, MD   40 mg at 05/18/21 2135   rosuvastatin (CRESTOR) tablet 2.5 mg  2.5 mg Oral QHS Mansy, Jan A, MD   2.5 mg at 05/18/21 2058   traZODone (DESYREL) tablet 25 mg  25 mg Oral QHS PRN Mansy, Jan A, MD   25 mg at 05/18/21 2138     Discharge Medications: Please see discharge summary for a list of discharge medications.  Relevant Imaging Results:  Relevant Lab Results:   Additional Information SSN:[id number]  Reuel Boom Ashayla Subia, LCSW

## 2021-05-19 NOTE — Progress Notes (Signed)
PROGRESS NOTE    Cheyenne Frank  ZOX:[id number] DOB: [birth year] DOA: 05/16/2021 PCP: [facility]    Chief Complaint  Patient presents with   Chest Pain    Brief Narrative:  Patient has a history of essential hypertension, sleep apnea, prior CVA, and paroxysmal atrial fibrillation on Eliquis for stroke prevention.  She presents to [facility] ED with chest pain, noted to be in atrial fibrillation with rapid ventricular rate  She is started on amiodarone drip and lasix drip on admission , she is also started on iv albumin overnight Cardiology consulted ,  Subjective:  She is hard of hearing, report feeling better Sitting up , on room air at rest, denies chest pain, No wheezing, no cough today during encounter  no fever Bilateral lower extremity trace pitting edema has resolved  Daughters are at bedside She has osa but not able to tolerate cpap  Assessment & Plan:   Active Problems:   Atrial fibrillation with RVR (HCC)   Afib/RVR -heart rate improved, currently sinus rhythm -continue currently regiment -follow cardiology recommendation  Acute heart failure exacerbation ,  lasix drip discontinued due to worsening renal function, lower extremity edema has resolved Update echo with preserved left ventricular EF Follow cards recommendation, cardiology input appreciated  Has audible wheezing today Suspect upper airway sound Chest x-ray no acute findings , initial CTA no acute lung findings, no PE No wheezing today, will check ambulatory o2  HTN: Bp start to trend up, increase coreg to 12.5mg  bid, monitor bp Continue hold  cozaar  AKI on ckd IIIb Likely cardiorenal lasix drip discontinued Avoid hypotension D/c cozaar Monitor BMP, cr improving  Insulin-dependent type 2 diabetes, uncontrolled Blood glucose stable on current regimen A1c 8.2  Class III obesity/OSA: Body mass index is 47.5 kg/m.Marland Kitchen Report not able to tolerate CPAP at night   Failure to  thrive, Pt eval recommended SNF, patient and family agrees    [employer] (From admission, onward)     Start     Ordered   05/17/21 0500  Basic metabolic panel  Daily,   STAT      05/16/21 2201              DVT prophylaxis:  apixaban (ELIQUIS) tablet 5 mg   Code Status: Full Family Communication: Daughters at bedside Disposition:   Status is: Inpatient   Dispo: The patient is from: Home              Anticipated d/c is to:  SNF              Anticipated d/c date is: To be determined, 24-48hrs              will be medically stable to discharge to snf on 9/3 , awaiting for snf bed  Consultants:  Cardiology  Procedures:  None  Antimicrobials:   Anti-infectives (From admission, onward)    None           Objective: Vitals:   05/19/21 0002 05/19/21 0426 05/19/21 0741 05/19/21 1117  BP: (!) 183/76 (!) 154/58 (!) 163/68 (!) 137/59  Pulse: 76 73 73 69  Resp: 18 18 18 17   Temp: 98.2 F (36.8 C) 98 F (36.7 C) 98.1 F (36.7 C) 98.1 F (36.7 C)  TempSrc: Oral Oral Oral   SpO2: 92% 95% 95% 96%  Weight:  117.8 kg    Height:        Intake/Output Summary (Last 24 hours) at 05/19/2021 1214 Last  data filed at 05/19/2021 1000 Gross per 24 hour  Intake 720 ml  Output 2350 ml  Net -1630 ml   Filed Weights   05/17/21 0839 05/18/21 0435 05/19/21 0426  Weight: 119.5 kg 117.9 kg 117.8 kg    Examination:  General exam: calm, NAD, hard of hearing Respiratory system: diminished, no wheezing today , no rales, no rhonchi, respiratory effort normal. Cardiovascular system: S1 & S2 heard, RRR.  Gastrointestinal system: Abdomen is nondistended, soft and nontender. Normal bowel sounds heard. Central nervous system: Alert and oriented. No focal neurological deficits. Extremities: Bilateral lower extremity pitting edema has resolved Skin: No rashes, lesions or ulcers Psychiatry: Judgement and insight appear normal. Mood & affect appropriate.     Data Reviewed: I  have personally reviewed following labs and imaging studies  CBC: Recent Labs  Lab 05/16/21 1745 05/17/21 0518  WBC 8.3 7.2  NEUTROABS 5.5  --   HGB 14.5 13.5  HCT 43.3 40.2  MCV 92.3 92.0  PLT 160 143*    Basic Metabolic Panel: Recent Labs  Lab 05/16/21 1745 05/16/21 2007 05/17/21 0518 05/17/21 1528 05/18/21 0504 05/19/21 0501  NA 139  --  138 138 141 141  K 4.2  --  4.2 3.9 4.0 4.2  CL 98  --  98 97* 97* 99  CO2 32  --  32 31 37* 35*  GLUCOSE 176*  --  163* 181* 122* 147*  BUN 35*  --  38* 40* 42* 39*  CREATININE 1.25*  --  1.48* 1.93* 1.76* 1.62*  CALCIUM 9.3  --  8.8* 8.8* 9.1 9.4  MG  --  1.9  --   --  2.3  --   PHOS  --   --   --  5.4*  --   --     GFR: Estimated Creatinine Clearance: 34.3 mL/min (A) (by C-G formula based on SCr of 1.62 mg/dL (H)).  Liver Function Tests: Recent Labs  Lab 05/16/21 1745 05/17/21 1528  AST 28  --   ALT 23  --   ALKPHOS 85  --   BILITOT 0.8  --   PROT 6.9  --   ALBUMIN 3.4* 3.8    CBG: Recent Labs  Lab 05/18/21 1159 05/18/21 1627 05/18/21 2038 05/19/21 0736 05/19/21 1118  GLUCAP 186* 204* 203* 149* 257*     Recent Results (from the past 240 hour(s))  Resp Panel by RT-PCR (Flu A&B, Covid) Nasopharyngeal Swab     Status: None   Collection Time: 05/16/21  8:07 PM   Specimen: Nasopharyngeal Swab; Nasopharyngeal(NP) swabs in vial transport medium  Result Value Ref Range Status   SARS Coronavirus 2 by RT PCR NEGATIVE NEGATIVE Final    Comment: (NOTE) SARS-CoV-2 target nucleic acids are NOT DETECTED.  The SARS-CoV-2 RNA is generally detectable in upper respiratory specimens during the acute phase of infection. The lowest concentration of SARS-CoV-2 viral copies this assay can detect is 138 copies/mL. A negative result does not preclude SARS-Cov-2 infection and should not be used as the sole basis for treatment or other patient management decisions. A negative result may occur with  improper specimen  collection/handling, submission of specimen other than nasopharyngeal swab, presence of viral mutation(s) within the areas targeted by this assay, and inadequate number of viral copies(<138 copies/mL). A negative result must be combined with clinical observations, patient history, and epidemiological information. The expected result is Negative.  Fact Sheet for Patients:  BloggerCourse.com  Fact Sheet for Healthcare Providers:  SeriousBroker.it  This test is no t yet approved or cleared by the [place] FDA and  has been authorized for detection and/or diagnosis of SARS-CoV-2 by FDA under an Emergency Use Authorization (EUA). This EUA will remain  in effect (meaning this test can be used) for the duration of the COVID-19 declaration under Section 564(b)(1) of the Act, 21 U.S.C.section 360bbb-3(b)(1), unless the authorization is terminated  or revoked sooner.       Influenza A by PCR NEGATIVE NEGATIVE Final   Influenza B by PCR NEGATIVE NEGATIVE Final    Comment: (NOTE) The Xpert Xpress SARS-CoV-2/FLU/RSV plus assay is intended as an aid in the diagnosis of influenza from Nasopharyngeal swab specimens and should not be used as a sole basis for treatment. Nasal washings and aspirates are unacceptable for Xpert Xpress SARS-CoV-2/FLU/RSV testing.  Fact Sheet for Patients: BloggerCourse.comhttps://www.fda.gov/media/152166/download  Fact Sheet for Healthcare Providers: SeriousBroker.ithttps://www.fda.gov/media/152162/download  This test is not yet approved or cleared by the [place] FDA and has been authorized for detection and/or diagnosis of SARS-CoV-2 by FDA under an Emergency Use Authorization (EUA). This EUA will remain in effect (meaning this test can be used) for the duration of the COVID-19 declaration under Section 564(b)(1) of the Act, 21 U.S.C. section 360bbb-3(b)(1), unless the authorization is terminated or revoked.  Performed at [facility], [address] [place], [place] [address]          Radiology Studies: DG Chest [place]  Result Date: 05/18/2021 CLINICAL DATA:  80 year old female with history of wheezing. Shortness of breath. EXAM: PORTABLE CHEST 1 VIEW COMPARISON:  Chest x-ray 05/16/2021. FINDINGS: Lung volumes are normal. No consolidative airspace disease. No pleural effusions. No pneumothorax. No evidence of pulmonary edema. Heart size is mildly enlarged. The patient is rotated to the right on today's exam and the patient is in a very lordotic position, resulting in distortion of the mediastinal contours and reduced diagnostic sensitivity and specificity for mediastinal pathology. IMPRESSION: 1. No radiographic evidence of acute cardiopulmonary disease. 2. Mild cardiomegaly. Electronically Signed   By: Trudie Reedaniel  Entrikin M.D.   On: 05/18/2021 15:48        Scheduled Meds:  amiodarone  200 mg Oral BID   apixaban  5 mg Oral BID   aspirin EC  81 mg Oral Daily   carvedilol  6.25 mg Oral BID   gabapentin  300 mg Oral BID   insulin aspart  0-15 Units Subcutaneous TID AC & HS   insulin glargine-yfgn  64 Units Subcutaneous QHS   linagliptin  5 mg Oral Daily   multivitamin  1 tablet Oral Daily   multivitamin with minerals  1 tablet Oral Daily   pantoprazole  40 mg Oral Daily   PARoxetine  40 mg Oral QHS   rosuvastatin  2.5 mg Oral QHS   Continuous Infusions:     LOS: 3 days   Time spent: 35mins Greater than 50% of this time was spent in counseling, explanation of diagnosis, planning of further management, and coordination of care.   Voice Recognition Reubin Milan/Dragon dictation system was used to create this note, attempts have been made to correct errors. Please contact the author with questions and/or clarifications.   Albertine GratesFang Daishon Chui, MD PhD FACP [facility]  Available via Epic secure chat 7am-7pm for nonurgent issues Please page for urgent issues To page the attending provider between 7A-7P or  the covering provider during after hours 7P-7A, please log into the web site [email or web address] and access using universal [place]  password for that web site. If you do not have the password, please call the hospital operator.    05/19/2021, 12:14 PM

## 2021-05-19 NOTE — Care Management Important Message (Signed)
Important Message  Patient Details  Name: Cheyenne Frank MRN: [id number] Date of Birth: 01/09/1941   Medicare Important Message Given:  Yes     [name] 05/19/2021, 2:58 PM

## 2021-05-19 NOTE — Progress Notes (Signed)
Inpatient Diabetes Program Recommendations  AACE/ADA: New Consensus Statement on Inpatient Glycemic Control (2015)  Target Ranges:  Prepandial:   less than 140 mg/dL      Peak postprandial:   less than 180 mg/dL (1-2 hours)      Critically ill patients:  140 - 180 mg/dL  Results for Cheyenne Frank, Cheyenne Frank (MRN [id number]) as of 05/19/2021 13:00  Ref. Range 05/19/2021 07:36 05/19/2021 11:18  Glucose-Capillary Latest Ref Range: 70 - 99 mg/dL 017 (H)  2 units Novolog  257 (H)  8 units Novolog    Home DM Meds: Lantus 77 units QHS    Humalog 22 units TID    Januvia 50 mg dail  Current Orders: Semglee 64 units QHS     Novolog 0-15 units TID ac/hs  Tradjenta 5 mg daily      MD- Please consider adding Novolog Meal Coverage.  Pt takes Humalog 22 units TID with meals at home:  Please consider starting Novolog 5 units TID with meals (25% home dose to start)  Hold if pt eats <50% of meal, Hold if pt NPO    --Will follow patient during hospitalization--  Ambrose Finland RN, MSN, CDE Diabetes Coordinator Inpatient Glycemic Control Team Team Pager: [phone] (8a-5p)

## 2021-05-19 NOTE — TOC Initial Note (Signed)
Transition of Care [facility]) - Initial/Assessment Note    Patient Details  Name: Cheyenne Frank MRN: [id number] Date of Birth: February 17, 1941  [facility] CM/SW Contact:    Maree Krabbe, LCSW Phone Number: 05/19/2021, 3:12 PM  Clinical Narrative:  PT's daughter Aram Beecham states pt is agreeable to SNF. Pt's daughter states their first choice would be [facility] however, they know that's rare they accept pt. CSW told pt's daughter that she would send referral to surrounding areas and follow up with bed offers.                  Expected Discharge Plan: Skilled Nursing Facility Barriers to Discharge: Continued Medical Work up   Patient Goals and CMS Choice Patient states their goals for this hospitalization and ongoing recovery are:: pt to go to snf   Choice offered to / list presented to : Adult Children  Expected Discharge Plan and Services Expected Discharge Plan: Skilled Nursing Facility In-house Referral: Clinical Social Work   Post Acute Care Choice: Skilled Nursing Facility Living arrangements for the past 2 months: Single Family Home                                      Prior Living Arrangements/Services Living arrangements for the past 2 months: Single Family Home Lives with:: Adult Children Patient language and need for interpreter reviewed:: Yes Do you feel safe going back to the place where you live?: Yes      Need for Family Participation in Patient Care: Yes (Comment) Care giver support system in place?: Yes (comment)   Criminal Activity/Legal Involvement Pertinent to Current Situation/Hospitalization: No - Comment as needed  Activities of Daily Living Home Assistive Devices/Equipment: Walker (specify type) (front wheel walker) ADL Screening (condition at time of admission) Patient's cognitive ability adequate to safely complete daily activities?: Yes Is the patient deaf or have difficulty hearing?: No Does the patient have difficulty seeing, even  when wearing glasses/contacts?: No Does the patient have difficulty concentrating, remembering, or making decisions?: No Patient able to express need for assistance with ADLs?: Yes Does the patient have difficulty dressing or bathing?: No Independently performs ADLs?: Yes (appropriate for developmental age) Does the patient have difficulty walking or climbing stairs?: Yes Weakness of Legs: Both Weakness of Arms/Hands: Left  Permission Sought/Granted Permission sought to share information with : Family Supports    Share Information with NAME: Aram Beecham     Permission granted to share info w Relationship: daughter     Emotional Assessment Appearance:: Appears stated age Attitude/Demeanor/Rapport: Engaged   Orientation: : Oriented to Self, Oriented to Place, Oriented to  Time, Oriented to Situation Alcohol / Substance Use: Not Applicable Psych Involvement: No (comment)  Admission diagnosis:  Acute respiratory failure with hypoxia (HCC) [J96.01] Atrial fibrillation with RVR (HCC) [I48.91] Uncontrolled atrial fibrillation (HCC) [I48.91] Patient Active Problem List   Diagnosis Date Noted   Acute diastolic CHF (congestive heart failure) (HCC)    AKI (acute kidney injury) (HCC)    Obesity, Class III, BMI 40-49.9 (morbid obesity) (HCC)    Atrial fibrillation with RVR (HCC) 05/16/2021   Head injury 12/27/2020   SDH (subdural hematoma) (HCC) 12/11/2013   Postoperative wound infection--lumbar spine/2015 12/11/2013   CVA (cerebral infarction) 12/11/2013   Postoperative anemia due to acute blood loss 04/09/2013   Insulin dependent type 2 diabetes mellitus (HCC) 04/07/2013   Hypertension 04/07/2013  Unspecified sleep apnea 04/07/2013   OA (osteoarthritis) of knee 04/06/2013   PCP:  [facility]:   [facility] [address] [place] - [address] [place] [address] Phone: [phone] Fax: [phone]     Social  Determinants of Health (SDOH) Interventions    Readmission Risk Interventions No flowsheet data found.

## 2021-05-19 NOTE — Progress Notes (Addendum)
Mobility Specialist - Progress Note   05/19/21 0900  Mobility  Activity Transferred:  Bed to chair  Level of Assistance Minimal assist, patient does 75% or more  Assistive Device Front wheel walker  Distance Ambulated (ft) 2 ft  Mobility Out of bed to chair with meals  Mobility Response Tolerated well  Mobility performed by Mobility specialist  $Mobility charge 1 Mobility    Pre-mobility: 77 HR, 84% SpO2 During mobility: 81 HR, 78% SpO2 Post-mobility: 78 HR, 92% SpO2   Pt SPT bed-chair with minA. Supervision and extra time to sit EOB with heavy use of rails. O2 desat to low 80s with transfers, high 70s once seated. RA. Some wheezing noted. PLB. Alarm set.   Filiberto Pinks Mobility Specialist 05/19/21, 9:32 AM

## 2021-05-19 NOTE — Progress Notes (Signed)
[facility]  SUBJECTIVE: Patient laying flat in bed, reports feeling better, not requiring supplemental oxygen   Vitals:   05/18/21 1629 05/18/21 1954 05/19/21 0002 05/19/21 0426  BP: (!) 141/60 (!) 158/69 (!) 183/76 (!) 154/58  Pulse: 79 76 76 73  Resp: 17 18 18 18   Temp: 98.1 F (36.7 C) 98.4 F (36.9 C) 98.2 F (36.8 C) 98 F (36.7 C)  TempSrc: Oral Oral Oral Oral  SpO2: 92% 94% 92% 95%  Weight:    117.8 kg  Height:         Intake/Output Summary (Last 24 hours) at 05/19/2021 07/19/2021 Last data filed at 05/19/2021 0428 Gross per 24 hour  Intake 480 ml  Output 3500 ml  Net -3020 ml      PHYSICAL EXAM  General: Well developed, well nourished, in no acute distress HEENT:  Normocephalic and atramatic Neck:  No JVD.  Lungs: Clear bilaterally to auscultation and percussion. Heart: HRRR . Normal S1 and S2 without gallops or murmurs.  Abdomen: Bowel sounds are positive, abdomen soft and non-tender  Msk:  Back normal, normal gait. Normal strength and tone for age. Extremities: No clubbing, cyanosis or edema.   Neuro: Alert and oriented X 3. Psych:  Good affect, responds appropriately   LABS: Basic Metabolic Panel: Recent Labs    05/16/21 2007 05/17/21 0518 05/17/21 1528 05/18/21 0504 05/19/21 0501  NA  --    < > 138 141 141  K  --    < > 3.9 4.0 4.2  CL  --    < > 97* 97* 99  CO2  --    < > 31 37* 35*  GLUCOSE  --    < > 181* 122* 147*  BUN  --    < > 40* 42* 39*  CREATININE  --    < > 1.93* 1.76* 1.62*  CALCIUM  --    < > 8.8* 9.1 9.4  MG 1.9  --   --  2.3  --   PHOS  --   --  5.4*  --   --    < > = values in this interval not displayed.   Liver Function Tests: Recent Labs    05/16/21 1745 05/17/21 1528  AST 28  --   ALT 23  --   ALKPHOS 85  --   BILITOT 0.8  --   PROT 6.9  --   ALBUMIN 3.4* 3.8   No results for input(s): LIPASE, AMYLASE in the last 72 hours. CBC: Recent Labs    05/16/21 1745 05/17/21 0518  WBC 8.3 7.2  NEUTROABS 5.5  --    HGB 14.5 13.5  HCT 43.3 40.2  MCV 92.3 92.0  PLT 160 143*   Cardiac Enzymes: No results for input(s): CKTOTAL, CKMB, CKMBINDEX, TROPONINI in the last 72 hours. BNP: Invalid input(s): POCBNP D-Dimer: No results for input(s): DDIMER in the last 72 hours. Hemoglobin A1C: Recent Labs    05/18/21 0504  HGBA1C 8.2*   Fasting Lipid Panel: No results for input(s): CHOL, HDL, LDLCALC, TRIG, CHOLHDL, LDLDIRECT in the last 72 hours. Thyroid Function Tests: Recent Labs    05/18/21 0504  TSH 1.584   Anemia Panel: No results for input(s): VITAMINB12, FOLATE, FERRITIN, TIBC, IRON, RETICCTPCT in the last 72 hours.  DG Chest Port 1 View  Result Date: 05/18/2021 CLINICAL DATA:  81 year old female with history of wheezing. Shortness of breath. EXAM: PORTABLE CHEST 1 VIEW COMPARISON:  Chest x-ray 05/16/2021. FINDINGS: Lung  volumes are normal. No consolidative airspace disease. No pleural effusions. No pneumothorax. No evidence of pulmonary edema. Heart size is mildly enlarged. The patient is rotated to the right on today's exam and the patient is in a very lordotic position, resulting in distortion of the mediastinal contours and reduced diagnostic sensitivity and specificity for mediastinal pathology. IMPRESSION: 1. No radiographic evidence of acute cardiopulmonary disease. 2. Mild cardiomegaly. Electronically Signed   By: Trudie Reed M.D.   On: 05/18/2021 15:48   ECHOCARDIOGRAM COMPLETE  Result Date: 05/17/2021    ECHOCARDIOGRAM REPORT   Patient Name:   Cheyenne Frank Date of Exam: 05/17/2021 Medical Rec #:  [id number]       Height:       62.0 in Accession #:    [id number]      Weight:       264.9 lb Date of Birth:  1941/06/18        BSA:          2.154 m Patient Age:    80 years        BP:           120/59 mmHg Patient Gender: F               HR:           72 bpm. Exam Location:  [facility] Procedure: 2D Echo, Color Doppler and Cardiac Doppler Indications:     Atrial Fibrillation I48.91                   CHF-acute diastolic I50.31  History:         Patient has no prior history of Echocardiogram examinations.                  Stroke; Risk Factors:Diabetes and Sleep Apnea.  Sonographer:     Cristela Blue Referring Phys:  [id number] JAN A MANSY Diagnosing Phys: Debbe Odea MD  Sonographer Comments: No apical window and Technically challenging study due to limited acoustic windows. IMPRESSIONS  1. Left ventricular ejection fraction, by estimation, is 55 to 60%. The left ventricle has normal function. The left ventricle has no regional wall motion abnormalities. There is mild left ventricular hypertrophy. Left ventricular diastolic function could not be evaluated.  2. Right ventricular systolic function is normal. The right ventricular size is normal.  3. The mitral valve is grossly normal. No evidence of mitral valve regurgitation. Moderate mitral annular calcification.  4. The aortic valve is grossly normal. Aortic valve regurgitation is not visualized. FINDINGS  Left Ventricle: Left ventricular ejection fraction, by estimation, is 55 to 60%. The left ventricle has normal function. The left ventricle has no regional wall motion abnormalities. The left ventricular internal cavity size was normal in size. There is  mild left ventricular hypertrophy. Left ventricular diastolic function could not be evaluated. Right Ventricle: The right ventricular size is normal. No increase in right ventricular wall thickness. Right ventricular systolic function is normal. Left Atrium: Left atrial size was normal in size. Right Atrium: Right atrial size was not well visualized. Pericardium: There is no evidence of pericardial effusion. Mitral Valve: The mitral valve is grossly normal. Moderate mitral annular calcification. No evidence of mitral valve regurgitation. Tricuspid Valve: The tricuspid valve is normal in structure. Tricuspid valve regurgitation is not demonstrated. Aortic Valve: The aortic valve is grossly normal. Aortic  valve regurgitation is not visualized. Pulmonic Valve: The pulmonic valve was not well visualized. Pulmonic valve regurgitation is not visualized. Aorta:  The aortic root is normal in size and structure. Venous: The inferior vena cava was not well visualized. IAS/Shunts: No atrial level shunt detected by color flow Doppler.  LEFT VENTRICLE PLAX 2D LVIDd:         3.60 cm LVIDs:         2.50 cm LV PW:         1.70 cm LV IVS:        1.80 cm LVOT diam:     2.00 cm LVOT Area:     3.14 cm  LEFT ATRIUM         Index LA diam:    3.90 cm 1.81 cm/m                        PULMONIC VALVE AORTA                 PV Vmax:          1.17 m/s Ao Root diam: 3.13 cm PV Peak grad:     5.5 mmHg                       PR End Diast Vel: 2.74 msec                       RVOT Peak grad:   6 mmHg   SHUNTS Systemic Diam: 2.00 cm Debbe Odea MD Electronically signed by Debbe Odea MD Signature Date/Time: 05/17/2021/2:28:37 PM    Final      Echo LVEF greater than 55 to 60%  TELEMETRY: Sinus rhythm at 74 bpm:  ASSESSMENT AND PLAN:  Active Problems:   Atrial fibrillation with RVR (HCC)    1. Atrial fibrillation with rapid ventricular rate, in patient with known paroxysmal atrial fibrillation on Eliquis for stroke prevention, converted to sinus rhythm on amiodarone bolus and drip, currently chest pain-free, clinically much improved without chest pain, shortness of breath or palpitation 2.  Mildly elevated troponin (13, 25, 72 ), very likely demand supply ischemia secondary to atrial fibrillation with rapid ventricular rate 3.  Essential hypertension, on carvedilol 4.  Hyperlipidemia, on rosuvastatin 5.  Type 2 diabetes, on insulin 6.  Chronic kidney disease, BUN and creatinine 39 and 1.62, respectively 7.  Wide-complex tachycardia, probable VT, lasting 13 beats, without recurrence   Recommendations   1.  Agree with current plan 2.  Continue amiodarone 200 mg p.o. twice daily 3.  Continue carvedilol 25 mg twice  daily 4.  Continue Eliquis for stroke prevention 5.  Defer full dose anticoagulation 6.  14-day Holter to be placed from our office prior to discharge 7.  If patient does well today, consider discharge home 8.  Follow-up with me next week which time will likely taper her amiodarone to 200 mg daily   Marcina Millard, MD, PhD, [facility] 05/19/2021 7:42 AM

## 2021-05-20 LAB — GLUCOSE, CAPILLARY
Glucose-Capillary: 104 mg/dL — ABNORMAL HIGH (ref 70–99)
Glucose-Capillary: 240 mg/dL — ABNORMAL HIGH (ref 70–99)
Glucose-Capillary: 194 mg/dL — ABNORMAL HIGH (ref 70–99)
Glucose-Capillary: 157 mg/dL — ABNORMAL HIGH (ref 70–99)

## 2021-05-20 LAB — BASIC METABOLIC PANEL
Anion gap: 4 — ABNORMAL LOW (ref 5–15)
BUN: 38 mg/dL — ABNORMAL HIGH (ref 8–23)
CO2: 38 mmol/L — ABNORMAL HIGH (ref 22–32)
Calcium: 9.2 mg/dL (ref 8.9–10.3)
Chloride: 102 mmol/L (ref 98–111)
Creatinine, Ser: 1.22 mg/dL — ABNORMAL HIGH (ref 0.44–1.00)
GFR, Estimated: 45 mL/min — ABNORMAL LOW (ref >60)
Glucose, Bld: 130 mg/dL — ABNORMAL HIGH (ref 70–99)
Potassium: 4.2 mmol/L (ref 3.5–5.1)
Sodium: 144 mmol/L (ref 135–145)

## 2021-05-20 MED ORDER — LOSARTAN POTASSIUM 25 MG PO TABS
25.0000 mg | ORAL_TABLET | Freq: Every day | ORAL | Status: DC
  Administered 2021-05-21 – 2021-05-22 (×2): 25 mg via ORAL
  Filled 2021-05-20 (×2): qty 1

## 2021-05-20 NOTE — Progress Notes (Addendum)
Physical Therapy Treatment Patient Details Name: RASHAWN ROLON MRN: [id number] DOB: 01-11-1941 Today's Date: 05/20/2021    History of Present Illness Mariza Bourget is a 80yoF here for CP, found to have AF/RVR. PMH: HTN, OSA, CVA, PAF, DM, Anemia.    PT Comments    Pt asleep upon entry, easily made awake, agreeable to session. Pt desaturating on room air 85-85% SpO2, but remains adequately saturated on 2L with AMB and stairs. Pt requires modA for bed mobility, then is able to perform 5 transfers from bed or recliner without assist, mod-heavy effort required, then pt AMB 2x41ft, 175ft, then remaining 50ft to room. Pt able to AMB 6 stairs with minGuard assist. Pt feels 70% back to baseline for mobility. Pt is more appropriate for DC to home with HHPT at this time. Pt is agreeable to updated recommendations. RN/MD made aware.   *concerningly, pt has no frank symptomology with hypoxia episodes, hence will need extensive education on relying on oximetry to meet her physiologic need through use of supplemental O2   Follow Up Recommendations  Home health PT;Supervision - Intermittent     Equipment Recommendations  None recommended by PT;Other (comment) (oxygen)    Recommendations for Other Services       Precautions / Restrictions Precautions Precautions: Fall Restrictions Weight Bearing Restrictions: No    Mobility  Bed Mobility Overal bed mobility: Needs Assistance Bed Mobility: Supine to Sit     Supine to sit: Min assist     General bed mobility comments: mostly needs 1 HHA to pull self forward    Transfers Overall transfer level: Needs assistance Equipment used: Rolling walker (2 wheeled) Transfers: Sit to/from Stand Sit to Stand: Supervision         General transfer comment: fluent RW use, well compensated technique 2/2 chronic BLE weakness  Ambulation/Gait Ambulation/Gait assistance: Supervision Gait Distance (Feet): 100 Feet (1ft RA, 69ft on 2L: 84% and 95%  respectively for O2 qulaification) Assistive device: Rolling walker (2 wheeled) Gait Pattern/deviations: WFL(Within Functional Limits)     General Gait Details: No frank LOB, steady while turning   Stairs Stairs: Yes Stairs assistance: Min guard Stair Management: Two rails Number of Stairs: 6     Wheelchair Mobility    Modified Rankin (Stroke Patients Only)       Balance                                            Cognition Arousal/Alertness: Awake/alert Behavior During Therapy: WFL for tasks assessed/performed Overall Cognitive Status: Within Functional Limits for tasks assessed                                        Exercises      General Comments        Pertinent Vitals/Pain Pain Assessment: No/denies pain    Home Living                      Prior Function            PT Goals (current goals can now be found in the care plan section) Acute Rehab PT Goals Patient Stated Goal: To go home PT Goal Formulation: With patient Time For Goal Achievement: 06/01/21 Potential to Achieve Goals: Fair Progress towards PT goals:  Progressing toward goals    Frequency    Min 2X/week      PT Plan Discharge plan needs to be updated    Co-evaluation              AM-PAC PT "6 Clicks" Mobility   Outcome Measure  Help needed turning from your back to your side while in a flat bed without using bedrails?: A Little Help needed moving from lying on your back to sitting on the side of a flat bed without using bedrails?: A Little Help needed moving to and from a bed to a chair (including a wheelchair)?: A Little Help needed standing up from a chair using your arms (e.g., wheelchair or bedside chair)?: A Little Help needed to walk in hospital room?: A Little Help needed climbing 3-5 steps with a railing? : A Little 6 Click Score: 18    End of Session Equipment Utilized During Treatment: Oxygen Activity Tolerance:  Patient tolerated treatment well;No increased pain Patient left: in chair;with call bell/phone within reach;with chair alarm set Nurse Communication: Mobility status PT Visit Diagnosis: Muscle weakness (generalized) (M62.81);History of falling (Z91.81);Repeated falls (R29.6);Difficulty in walking, not elsewhere classified (R26.2)     Time: 1791-5056 PT Time Calculation (min) (ACUTE ONLY): 50 min  Charges:  $Gait Training: 8-22 mins $Therapeutic Exercise: 23-37 mins                    5:23 PM, 05/20/21 Rosamaria Lints, PT, DPT Physical Therapist - [facility]  [phone] (ASCOM)     Crosby Bevan C 05/20/2021, 5:20 PM

## 2021-05-20 NOTE — Plan of Care (Signed)

## 2021-05-20 NOTE — Progress Notes (Signed)
PROGRESS NOTE    Cheyenne Frank  ZOX:[id number] DOB: 10/03/1940 DOA: 05/16/2021 [place]    Chief Complaint  Patient presents with   Chest Pain    Brief Narrative:  Patient has a history of essential hypertension, sleep apnea, prior CVA, and paroxysmal atrial fibrillation on Eliquis for stroke prevention.  She [facility] ED with chest pain, noted to be in atrial fibrillation with rapid ventricular rate  She is started on amiodarone drip and lasix drip on admission , she is also started on iv albumin overnight Cardiology consulted ,  Subjective:  She is hard of hearing, report feeling better No wheezing, no cough today during encounter  no fever No edema   Assessment & Plan:   Active Problems:   Insulin dependent type 2 diabetes mellitus (HCC)   Atrial fibrillation with RVR (HCC)   Acute diastolic CHF (congestive heart failure) (HCC)   AKI (acute kidney injury) (HCC)   Obesity, Class III, BMI 40-49.9 (morbid obesity) (HCC)   Afib/RVR, paroxysmal A. fib -Initially required amiodarone drip, converted to sinus rhythm --seen by cardiology, currently on amiodarone 200 mg twice a day, Coreg 25 mg twice a day, continue Eliquis for stroke prevention,  -She is to follow-up with cardiology in 1 week at which time amiodarone be tapered to 200mg  daily -Appreciate cardiology input  13beats Wide-complex tachycardia  Keep K above 4, mag above 2 TSH 1.58 On Coreg and amiodarone cardiology plan for 14-day Holter to be placed prior to discharge  Acute on chronic diastolic heart failure exacerbation  -On Lasix 20 mg daily at home, she was treated with Lasix drip initially -lasix drip discontinued due to worsening renal function, lower extremity edema has resolved -Update echo with preserved left ventricular EF -plan to resume oral lasix 40mg  everyotherday -Seen by cardiology, cardiology input appreciated   Has audible wheezing on 9/1, likely   upper airway sound Chest x-ray no acute findings , initial CTA no acute lung findings, no PE No wheezing since  will check ambulatory o2, o2 dropped upon ambulation , will need discharge on o2  HTN: Blood pressure stable on current regimen Cozaar held due to aki, cr back to baseline, resume cozaar at a lower dose from 9/4  AKI on ckd IIIb Likely cardiorenal Avoid hypotension Cozaar held, cr back to baseline, resume cozaar at a lower dose on 9/4   Insulin-dependent type 2 diabetes, uncontrolled Blood glucose stable on current regimen A1c 8.2  Class III obesity/OSA: Body mass index is 48.06 kg/m.Marland Kitchen. Report not able to tolerate CPAP at night   Failure to thrive, Pt eval recommended SNF, patient and family agrees    [employer] CorporationUnresulted Labs (From admission, onward)     Start     Ordered   05/21/21 0500  Magnesium  Tomorrow morning,   R       Question:  Specimen collection method  Answer:  Lab=Lab collect   05/20/21 1520   05/17/21 0500  Basic metabolic panel  Daily,   STAT      05/16/21 2201              DVT prophylaxis:  apixaban (ELIQUIS) tablet 5 mg   Code Status: Full Family Communication: Daughter over the phone Disposition:   Status is: Inpatient   Dispo: The patient is from: Home              Anticipated d/c is to:  SNF, need discharge on o2  Anticipated d/c date is:  medically stable to discharge to snf , awaiting for snf bed  Consultants:  Cardiology  Procedures:  None  Antimicrobials:   Anti-infectives (From admission, onward)    None           Objective: Vitals:   05/20/21 0518 05/20/21 0518 05/20/21 0816 05/20/21 1204  BP: (!) 149/68 (!) 149/68 (!) 168/69 122/64  Pulse: 63 63 (!) 59 (!) 55  Resp:   18 18  Temp: (!) 97.5 F (36.4 C)  97.9 F (36.6 C)   TempSrc: Oral     SpO2: 93% 93% 97% 100%  Weight:      Height:        Intake/Output Summary (Last 24 hours) at 05/20/2021 1527 Last data filed at 05/20/2021 0153 Gross per  24 hour  Intake 240 ml  Output 750 ml  Net -510 ml   Filed Weights   05/18/21 0435 05/19/21 0426 05/20/21 0500  Weight: 117.9 kg 117.8 kg 119.2 kg    Examination:  General exam: calm, NAD, hard of hearing Respiratory system: diminished, no wheezing today , no rales, no rhonchi, respiratory effort normal. Cardiovascular system: S1 & S2 heard, RRR.  Gastrointestinal system: Abdomen is nondistended, soft and nontender. Normal bowel sounds heard. Central nervous system: Alert and oriented. No focal neurological deficits. Extremities: Bilateral lower extremity pitting edema has resolved Skin: No rashes, lesions or ulcers Psychiatry: Judgement and insight appear normal. Mood & affect appropriate.     Data Reviewed: I have personally reviewed following labs and imaging studies  CBC: Recent Labs  Lab 05/16/21 1745 05/17/21 0518  WBC 8.3 7.2  NEUTROABS 5.5  --   HGB 14.5 13.5  HCT 43.3 40.2  MCV 92.3 92.0  PLT 160 143*    Basic Metabolic Panel: Recent Labs  Lab 05/16/21 2007 05/17/21 0518 05/17/21 1528 05/18/21 0504 05/19/21 0501 05/20/21 0443  NA  --  138 138 141 141 144  K  --  4.2 3.9 4.0 4.2 4.2  CL  --  98 97* 97* 99 102  CO2  --  32 31 37* 35* 38*  GLUCOSE  --  163* 181* 122* 147* 130*  BUN  --  38* 40* 42* 39* 38*  CREATININE  --  1.48* 1.93* 1.76* 1.62* 1.22*  CALCIUM  --  8.8* 8.8* 9.1 9.4 9.2  MG 1.9  --   --  2.3  --   --   PHOS  --   --  5.4*  --   --   --     GFR: Estimated Creatinine Clearance: 45.9 mL/min (A) (by C-G formula based on SCr of 1.22 mg/dL (H)).  Liver Function Tests: Recent Labs  Lab 05/16/21 1745 05/17/21 1528  AST 28  --   ALT 23  --   ALKPHOS 85  --   BILITOT 0.8  --   PROT 6.9  --   ALBUMIN 3.4* 3.8    CBG: Recent Labs  Lab 05/19/21 1518 05/19/21 2047 05/19/21 2355 05/20/21 0814 05/20/21 1203  GLUCAP 238* 209* 207* 104* 240*     Recent Results (from the past 240 hour(s))  Resp Panel by RT-PCR (Flu A&B,  Covid) Nasopharyngeal Swab     Status: None   Collection Time: 05/16/21  8:07 PM   Specimen: Nasopharyngeal Swab; Nasopharyngeal(NP) swabs in vial transport medium  Result Value Ref Range Status   SARS Coronavirus 2 by RT PCR NEGATIVE NEGATIVE Final    Comment: (NOTE) SARS-CoV-2  target nucleic acids are NOT DETECTED.  The SARS-CoV-2 RNA is generally detectable in upper respiratory specimens during the acute phase of infection. The lowest concentration of SARS-CoV-2 viral copies this assay can detect is 138 copies/mL. A negative result does not preclude SARS-Cov-2 infection and should not be used as the sole basis for treatment or other patient management decisions. A negative result may occur with  improper specimen collection/handling, submission of specimen other than nasopharyngeal swab, presence of viral mutation(s) within the areas targeted by this assay, and inadequate number of viral copies(<138 copies/mL). A negative result must be combined with clinical observations, patient history, and epidemiological information. The expected result is Negative.  Fact Sheet for Patients:  BloggerCourse.com  Fact Sheet for Healthcare Providers:  SeriousBroker.it  This test is no t yet approved or cleared by the [place] FDA and  has been authorized for detection and/or diagnosis of SARS-CoV-2 by FDA under an Emergency Use Authorization (EUA). This EUA will remain  in effect (meaning this test can be used) for the duration of the COVID-19 declaration under Section 564(b)(1) of the Act, 21 U.S.C.section 360bbb-3(b)(1), unless the authorization is terminated  or revoked sooner.       Influenza A by PCR NEGATIVE NEGATIVE Final   Influenza B by PCR NEGATIVE NEGATIVE Final    Comment: (NOTE) The Xpert Xpress SARS-CoV-2/FLU/RSV plus assay is intended as an aid in the diagnosis of influenza from Nasopharyngeal swab specimens and should  not be used as a sole basis for treatment. Nasal washings and aspirates are unacceptable for Xpert Xpress SARS-CoV-2/FLU/RSV testing.  Fact Sheet for Patients: BloggerCourse.com  Fact Sheet for Healthcare Providers: SeriousBroker.it  This test is not yet approved or cleared by the [place] FDA and has been authorized for detection and/or diagnosis of SARS-CoV-2 by FDA under an Emergency Use Authorization (EUA). This EUA will remain in effect (meaning this test can be used) for the duration of the COVID-19 declaration under Section 564(b)(1) of the Act, 21 U.S.C. section 360bbb-3(b)(1), unless the authorization is terminated or revoked.  Performed at [facility], [address] [place], [place] [address]          Radiology Studies: No results found.      Scheduled Meds:  amiodarone  200 mg Oral BID   apixaban  5 mg Oral BID   aspirin EC  81 mg Oral Daily   carvedilol  25 mg Oral BID   gabapentin  300 mg Oral BID   insulin aspart  0-15 Units Subcutaneous TID AC & HS   insulin glargine-yfgn  64 Units Subcutaneous QHS   linagliptin  5 mg Oral Daily   [START ON 05/21/2021] losartan  25 mg Oral Daily   multivitamin  1 tablet Oral Daily   multivitamin with minerals  1 tablet Oral Daily   pantoprazole  40 mg Oral Daily   PARoxetine  40 mg Oral QHS   rosuvastatin  2.5 mg Oral QHS   Continuous Infusions:     LOS: 4 days   Time spent: Greater than 50% of this time was spent in counseling, explanation of diagnosis, planning of further management, and coordination of care.   Voice Recognition Reubin Milan dictation system was used to create this note, attempts have been made to correct errors. Please contact the author with questions and/or clarifications.   Albertine Grates, MD PhD FACP [facility]  Available via Epic secure chat 7am-7pm for nonurgent issues Please page for urgent issues To page  the attending provider between 7A-7P or the covering provider during after hours 7P-7A, please log into the web site [email or web address] and access using universal [place] password for that web site. If you do not have the password, please call the hospital operator.    05/20/2021, 3:27 PM

## 2021-05-20 NOTE — Progress Notes (Signed)
Oxygen Qualification Note  Patient Details Name: CIJI BOSTON MRN: [id number] DOB: 1941/01/17  SpO2 on room air at rest = 86% SpO2 on room air while ambulating = 84% SpO2 on 2L/min O2 while ambulating = 95%  Pt desaturates on room air with mobility and requires supplemental oxygen to maintain a safe saturation level.    4:39 PM, 05/20/21 Rosamaria Lints, PT, DPT Physical Therapist - [facility]  [phone] (ASCOM)   Oryon Gary C 05/20/2021, 4:37 PM

## 2021-05-20 NOTE — TOC Progression Note (Signed)
Transition of Care [facility]) - Progression Note    Patient Details  Name: Cheyenne Frank MRN: [id number] Date of Birth: [birth year]  [facility] CM/SW Contact  Bing Quarry, RN Phone Number: 05/20/2021, 12:00 PM  Clinical Narrative:  SNF Bed search initiated 9/2 but CMSW. Checked status and no acceptances or denials at this time. 1200 05/20/21. Gabriel Cirri RN CM      Expected Discharge Plan: Skilled Nursing Facility Barriers to Discharge: Continued Medical Work up  Expected Discharge Plan and Services Expected Discharge Plan: Skilled Nursing Facility In-house Referral: Clinical Social Work   Post Acute Care Choice: Skilled Nursing Facility Living arrangements for the past 2 months: Single Family Home                                       Social Determinants of Health (SDOH) Interventions    Readmission Risk Interventions No flowsheet data found.

## 2021-05-21 LAB — GLUCOSE, CAPILLARY
Glucose-Capillary: 157 mg/dL — ABNORMAL HIGH (ref 70–99)
Glucose-Capillary: 240 mg/dL — ABNORMAL HIGH (ref 70–99)
Glucose-Capillary: 275 mg/dL — ABNORMAL HIGH (ref 70–99)
Glucose-Capillary: 260 mg/dL — ABNORMAL HIGH (ref 70–99)

## 2021-05-21 LAB — URINALYSIS, COMPLETE (UACMP) WITH MICROSCOPIC
Bacteria, UA: NONE SEEN
Bilirubin Urine: NEGATIVE
Glucose, UA: NEGATIVE mg/dL
Ketones, ur: NEGATIVE mg/dL
Leukocytes,Ua: NEGATIVE
Nitrite: NEGATIVE
Protein, ur: >300 mg/dL — AB
Specific Gravity, Urine: 1.025 (ref 1.005–1.030)
pH: 5.5 (ref 5.0–8.0)

## 2021-05-21 LAB — BASIC METABOLIC PANEL
Anion gap: 7 (ref 5–15)
BUN: 42 mg/dL — ABNORMAL HIGH (ref 8–23)
CO2: 34 mmol/L — ABNORMAL HIGH (ref 22–32)
Calcium: 9.4 mg/dL (ref 8.9–10.3)
Chloride: 101 mmol/L (ref 98–111)
Creatinine, Ser: 1.67 mg/dL — ABNORMAL HIGH (ref 0.44–1.00)
GFR, Estimated: 31 mL/min — ABNORMAL LOW (ref >60)
Glucose, Bld: 211 mg/dL — ABNORMAL HIGH (ref 70–99)
Potassium: 4.8 mmol/L (ref 3.5–5.1)
Sodium: 142 mmol/L (ref 135–145)

## 2021-05-21 LAB — MAGNESIUM: Magnesium: 2.6 mg/dL — ABNORMAL HIGH (ref 1.7–2.4)

## 2021-05-21 MED ORDER — INSULIN GLARGINE-YFGN 100 UNIT/ML ~~LOC~~ SOLN
77.0000 [IU] | Freq: Every day | SUBCUTANEOUS | Status: DC
  Administered 2021-05-21: 77 [IU] via SUBCUTANEOUS
  Filled 2021-05-21 (×2): qty 0.77

## 2021-05-21 MED ORDER — INSULIN ASPART 100 UNIT/ML IJ SOLN
6.0000 [IU] | Freq: Three times a day (TID) | INTRAMUSCULAR | Status: DC
  Administered 2021-05-22 (×2): 6 [IU] via SUBCUTANEOUS
  Filled 2021-05-21 (×2): qty 1

## 2021-05-21 NOTE — Plan of Care (Signed)

## 2021-05-21 NOTE — TOC Progression Note (Signed)
Transition of Care [facility]) - Progression Note    Patient Details  Name: Cheyenne Frank MRN: [id number] Date of Birth: [birth year]  Transition of Care [facility]) CM/SW Contact  Bing Quarry, RN Phone Number: 05/21/2021, 3:54 PM  Clinical Narrative: PT recommendations changed to [facility] PT. Discussed with daughter [name]. No preferences. Agreed to [facility]. [facility] accepted via [place]. DME home oxygen ordered and [facility] accepted via [place] [place]. Will set up home and transport oxygen via contact with daughter, [name]. Patient lives with this daughter and she will transport patient home.  Daughter indicated she was told patient would discharge tomorrow. HH and DME orders already placed. Gabriel Cirri RN CM     Expected Discharge Plan: Skilled Nursing Facility Barriers to Discharge: Continued Medical Work up  Expected Discharge Plan and Services Expected Discharge Plan: Skilled Nursing Facility In-house Referral: Clinical Social Work   Post Acute Care Choice: Skilled Nursing Facility Living arrangements for the past 2 months: Single Family Home                                       Social Determinants of Health (SDOH) Interventions    Readmission Risk Interventions No flowsheet data found.

## 2021-05-21 NOTE — Progress Notes (Signed)
Physical Therapy Treatment Patient Details Name: Cheyenne Frank MRN: [id number] DOB: [birth year] Today's Date: 05/21/2021    History of Present Illness Cheyenne Frank is a 80yoF here for CP, found to have AF/RVR. PMH: HTN, OSA, CVA, PAF, DM, Anemia.    PT Comments    Pt seen for PT tx with 2 daughters present for session. Educated pt & family on PT recommendation of HHPT f/u with daughters concerned pt won't participate in HHPT services; reviewed importance of keeping & participating in these appointments with pt & pt agreeable.  Discussed need for home O2 & to manage O2 tubing with daughters reporting this will be an obstacle & are hopeful to d/c with bag O2 vs concentrator. During session pt completes bed mobility & gait in room with RW & supervision. Encouraged family to allow pt to attempt activities before they assist her or do them for her to allow her to increase independence with mobility; discussed walking to kitchen, bathroom, etc is considered exercise. Pt & family voiced understanding of all education during session.  Pt received in bed on room air with SpO2 dropping as low as 82% but as high as 93%. Pt placed on 2L/min & able to maintain SpO2 89% or higher with supplemental O2. Also discussed use of pulse oximeter to monitor O2 levels at home.    Follow Up Recommendations  Home health PT;Supervision for mobility/OOB     Equipment Recommendations   (O2)    Recommendations for Other Services       Precautions / Restrictions Precautions Precautions: Fall Restrictions Weight Bearing Restrictions: No    Mobility  Bed Mobility Overal bed mobility: Needs Assistance Bed Mobility: Supine to Sit     Supine to sit: Supervision;HOB elevated     General bed mobility comments: bed rails    Transfers Overall transfer level: Needs assistance   Transfers: Sit to/from Stand Sit to Stand: Supervision         General transfer comment: cuing but poor demo of safe hand  placement when using RW for sit>stand  Ambulation/Gait Ambulation/Gait assistance: Supervision Gait Distance (Feet): 35 Feet Assistive device: Rolling walker (2 wheeled)   Gait velocity: decreased gait speed   General Gait Details: Able to negotiate small space in room with RW & supervision   Stairs             Wheelchair Mobility    Modified Rankin (Stroke Patients Only)       Balance Overall balance assessment: Needs assistance Sitting-balance support: Feet supported;Bilateral upper extremity supported Sitting balance-Leahy Scale: Good     Standing balance support: Bilateral upper extremity supported;During functional activity Standing balance-Leahy Scale: Fair Standing balance comment: BUE support on RW                            Cognition Arousal/Alertness: Awake/alert Behavior During Therapy: WFL for tasks assessed/performed Overall Cognitive Status: Within Functional Limits for tasks assessed                                        Exercises      General Comments        Pertinent Vitals/Pain Pain Assessment: No/denies pain    Home Living                      Prior Function  PT Goals (current goals can now be found in the care plan section) Acute Rehab PT Goals Patient Stated Goal: To go home PT Goal Formulation: With patient Time For Goal Achievement: 06/01/21 Potential to Achieve Goals: Fair Progress towards PT goals: Progressing toward goals    Frequency    Min 2X/week      PT Plan Current plan remains appropriate    Co-evaluation              AM-PAC PT "6 Clicks" Mobility   Outcome Measure  Help needed turning from your back to your side while in a flat bed without using bedrails?: A Little Help needed moving from lying on your back to sitting on the side of a flat bed without using bedrails?: A Little Help needed moving to and from a bed to a chair (including a  wheelchair)?: A Little Help needed standing up from a chair using your arms (e.g., wheelchair or bedside chair)?: A Little Help needed to walk in hospital room?: A Little Help needed climbing 3-5 steps with a railing? : A Little 6 Click Score: 18    End of Session Equipment Utilized During Treatment: Oxygen Activity Tolerance: Patient tolerated treatment well Patient left: in chair;with call bell/phone within reach;with family/visitor present   PT Visit Diagnosis: Muscle weakness (generalized) (M62.81);History of falling (Z91.81);Repeated falls (R29.6);Difficulty in walking, not elsewhere classified (R26.2)     Time: 1696-7893 PT Time Calculation (min) (ACUTE ONLY): 24 min  Charges:  $Therapeutic Activity: 23-37 mins                     Aleda Grana, PT, DPT 05/21/21, 3:10 PM    Sandi Mariscal 05/21/2021, 3:07 PM

## 2021-05-21 NOTE — Progress Notes (Signed)
PROGRESS NOTE    Cheyenne Frank  DJM:[id number] DOB: Dec 09, 1940 DOA: 05/16/2021 PCP: [facility]    Chief Complaint  Patient presents with   Chest Pain    Brief Narrative:  Patient has a history of essential hypertension, sleep apnea, prior CVA, and paroxysmal atrial fibrillation on Eliquis for stroke prevention.  She presents to [facility] ED with chest pain, noted to be in atrial fibrillation with rapid ventricular rate  She is started on amiodarone drip and lasix drip on admission , she is also started on iv albumin overnight Cardiology consulted ,  Subjective:  She is hard of hearing, report feeling better, she is sitting up in chair No wheezing, no cough today during encounter  no fever No edema   Assessment & Plan:   Active Problems:   Insulin dependent type 2 diabetes mellitus (HCC)   Atrial fibrillation with RVR (HCC)   Acute diastolic CHF (congestive heart failure) (HCC)   AKI (acute kidney injury) (HCC)   Obesity, Class III, BMI 40-49.9 (morbid obesity) (HCC)   Afib/RVR, paroxysmal A. fib -Initially required amiodarone drip, converted to sinus rhythm --seen by cardiology, currently on amiodarone 200 mg twice a day, Coreg 25 mg twice a day, continue Eliquis for stroke prevention,  -She is to follow-up with cardiology in 1 week at which time amiodarone be tapered to 200mg  daily -Appreciate cardiology input  13beats Wide-complex tachycardia  Keep K above 4, mag above 2 TSH 1.58 On Coreg and amiodarone cardiology plan for 14-day Holter to be placed prior to discharge  Acute on chronic diastolic heart failure exacerbation  -On Lasix 20 mg daily at home, she was treated with Lasix drip initially -lasix drip discontinued due to worsening renal function, lower extremity edema has resolved -Update echo with preserved left ventricular EF -plan to resume oral lasix 40mg  every other day at discharge -Seen by cardiology, cardiology input  appreciated   Has audible wheezing on 9/1, likely  upper airway sound Chest x-ray no acute findings , initial CTA no acute lung findings, no PE No wheezing since   o2 dropped upon ambulation , discharge on  home o2, order placed   HTN: Blood pressure stable on current regimen Cozaar held initially due to aki, cr improved, resume cozaar at a lower dose from 9/4  AKI on ckd IIIb Likely cardiorenal Avoid hypotension Cozaar held, cr improved, resume cozaar at a lower dose on 9/4 Cr fluctuating, repeat ua no infection, repeat bmp in am  Insulin-dependent type 2 diabetes, uncontrolled Blood glucose elevated, insulin dose adjusted  A1c 8.2  Class III obesity/OSA: Body mass index is 47.37 kg/m.11/4 Report not able to tolerate CPAP at night   Failure to thrive, Pt eval initially recommended SNF, patient has showed improvement, repeat PT eval recommend home health    Unresulted Labs (From admission, onward)     Start     Ordered   05/22/21 0500  Basic metabolic panel  Tomorrow morning,   R       Question:  Specimen collection method  Answer:  Lab=Lab collect   05/21/21 1246              DVT prophylaxis:  apixaban (ELIQUIS) tablet 5 mg   Code Status: Full Family Communication: Daughter daily Disposition:   Status is: Inpatient   Dispo: The patient is from: Home              Anticipated d/c is to:  home with home health, home  o2, order placed               Anticipated d/c date is:  on 9/5 if cr improves Consultants:  Cardiology  Procedures:  None  Antimicrobials:   Anti-infectives (From admission, onward)    None           Objective: Vitals:   05/21/21 0400 05/21/21 0500 05/21/21 0900 05/21/21 1200  BP: (!) 156/69  (!) 155/66 133/71  Pulse: 66  64 64  Resp:   19 17  Temp: 98.5 F (36.9 C)  98.4 F (36.9 C) 98.6 F (37 C)  TempSrc: Oral     SpO2: 100%  97% 96%  Weight:  117.5 kg    Height:        Intake/Output Summary (Last 24 hours) at  05/21/2021 1247 Last data filed at 05/21/2021 1023 Gross per 24 hour  Intake 240 ml  Output 1000 ml  Net -760 ml   Filed Weights   05/19/21 0426 05/20/21 0500 05/21/21 0500  Weight: 117.8 kg 119.2 kg 117.5 kg    Examination:  General exam: calm, NAD, hard of hearing Respiratory system: diminished, no wheezing today , no rales, no rhonchi, respiratory effort normal. Cardiovascular system: S1 & S2 heard, RRR.  Gastrointestinal system: Abdomen is nondistended, soft and nontender. Normal bowel sounds heard. Central nervous system: Alert and oriented. No focal neurological deficits. Extremities: Bilateral lower extremity pitting edema has resolved Skin: No rashes, lesions or ulcers Psychiatry: Judgement and insight appear normal. Mood & affect appropriate.     Data Reviewed: I have personally reviewed following labs and imaging studies  CBC: Recent Labs  Lab 05/16/21 1745 05/17/21 0518  WBC 8.3 7.2  NEUTROABS 5.5  --   HGB 14.5 13.5  HCT 43.3 40.2  MCV 92.3 92.0  PLT 160 143*    Basic Metabolic Panel: Recent Labs  Lab 05/16/21 2007 05/17/21 0518 05/17/21 1528 05/18/21 0504 05/19/21 0501 05/20/21 0443 05/21/21 0438  NA  --    < > 138 141 141 144 142  K  --    < > 3.9 4.0 4.2 4.2 4.8  CL  --    < > 97* 97* 99 102 101  CO2  --    < > 31 37* 35* 38* 34*  GLUCOSE  --    < > 181* 122* 147* 130* 211*  BUN  --    < > 40* 42* 39* 38* 42*  CREATININE  --    < > 1.93* 1.76* 1.62* 1.22* 1.67*  CALCIUM  --    < > 8.8* 9.1 9.4 9.2 9.4  MG 1.9  --   --  2.3  --   --  2.6*  PHOS  --   --  5.4*  --   --   --   --    < > = values in this interval not displayed.    GFR: Estimated Creatinine Clearance: 33.2 mL/min (A) (by C-G formula based on SCr of 1.67 mg/dL (H)).  Liver Function Tests: Recent Labs  Lab 05/16/21 1745 05/17/21 1528  AST 28  --   ALT 23  --   ALKPHOS 85  --   BILITOT 0.8  --   PROT 6.9  --   ALBUMIN 3.4* 3.8    CBG: Recent Labs  Lab 05/20/21 1203  05/20/21 1618 05/20/21 2034 05/21/21 0914 05/21/21 1145  GLUCAP 240* 194* 157* 157* 240*     Recent Results (from the past 240  hour(s))  Resp Panel by RT-PCR (Flu A&B, Covid) Nasopharyngeal Swab     Status: None   Collection Time: 05/16/21  8:07 PM   Specimen: Nasopharyngeal Swab; Nasopharyngeal(NP) swabs in vial transport medium  Result Value Ref Range Status   SARS Coronavirus 2 by RT PCR NEGATIVE NEGATIVE Final    Comment: (NOTE) SARS-CoV-2 target nucleic acids are NOT DETECTED.  The SARS-CoV-2 RNA is generally detectable in upper respiratory specimens during the acute phase of infection. The lowest concentration of SARS-CoV-2 viral copies this assay can detect is 138 copies/mL. A negative result does not preclude SARS-Cov-2 infection and should not be used as the sole basis for treatment or other patient management decisions. A negative result may occur with  improper specimen collection/handling, submission of specimen other than nasopharyngeal swab, presence of viral mutation(s) within the areas targeted by this assay, and inadequate number of viral copies(<138 copies/mL). A negative result must be combined with clinical observations, patient history, and epidemiological information. The expected result is Negative.  Fact Sheet for Patients:  BloggerCourse.com  Fact Sheet for Healthcare Providers:  SeriousBroker.it  This test is no t yet approved or cleared by the [place] FDA and  has been authorized for detection and/or diagnosis of SARS-CoV-2 by FDA under an Emergency Use Authorization (EUA). This EUA will remain  in effect (meaning this test can be used) for the duration of the COVID-19 declaration under Section 564(b)(1) of the Act, 21 U.S.C.section 360bbb-3(b)(1), unless the authorization is terminated  or revoked sooner.       Influenza A by PCR NEGATIVE NEGATIVE Final   Influenza B by PCR NEGATIVE  NEGATIVE Final    Comment: (NOTE) The Xpert Xpress SARS-CoV-2/FLU/RSV plus assay is intended as an aid in the diagnosis of influenza from Nasopharyngeal swab specimens and should not be used as a sole basis for treatment. Nasal washings and aspirates are unacceptable for Xpert Xpress SARS-CoV-2/FLU/RSV testing.  Fact Sheet for Patients: BloggerCourse.com  Fact Sheet for Healthcare Providers: SeriousBroker.it  This test is not yet approved or cleared by the [place] FDA and has been authorized for detection and/or diagnosis of SARS-CoV-2 by FDA under an Emergency Use Authorization (EUA). This EUA will remain in effect (meaning this test can be used) for the duration of the COVID-19 declaration under Section 564(b)(1) of the Act, 21 U.S.C. section 360bbb-3(b)(1), unless the authorization is terminated or revoked.  Performed at [facility], [address], [place], [place] [address]          Radiology Studies: No results found.      Scheduled Meds:  amiodarone  200 mg Oral BID   apixaban  5 mg Oral BID   aspirin EC  81 mg Oral Daily   carvedilol  25 mg Oral BID   gabapentin  300 mg Oral BID   insulin aspart  0-15 Units Subcutaneous TID AC & HS   insulin glargine-yfgn  64 Units Subcutaneous QHS   linagliptin  5 mg Oral Daily   losartan  25 mg Oral Daily   multivitamin  1 tablet Oral Daily   multivitamin with minerals  1 tablet Oral Daily   pantoprazole  40 mg Oral Daily   PARoxetine  40 mg Oral QHS   rosuvastatin  2.5 mg Oral QHS   Continuous Infusions:     LOS: 5 days   Time spent: Greater than 50% of this time was spent in counseling, explanation of diagnosis, planning of further management, and coordination  of care.   Voice Recognition Reubin Milan dictation system was used to create this note, attempts have been made to correct errors. Please contact the author with questions and/or  clarifications.   Albertine Grates, MD PhD FACP [facility]  Available via Epic secure chat 7am-7pm for nonurgent issues Please page for urgent issues To page the attending provider between 7A-7P or the covering provider during after hours 7P-7A, please log into the web site [email or web address] and access using universal [place] password for that web site. If you do not have the password, please call the hospital operator.    05/21/2021, 12:47 PM

## 2021-05-22 LAB — BASIC METABOLIC PANEL
Anion gap: 4 — ABNORMAL LOW (ref 5–15)
BUN: 41 mg/dL — ABNORMAL HIGH (ref 8–23)
CO2: 36 mmol/L — ABNORMAL HIGH (ref 22–32)
Calcium: 9.2 mg/dL (ref 8.9–10.3)
Chloride: 101 mmol/L (ref 98–111)
Creatinine, Ser: 1.36 mg/dL — ABNORMAL HIGH (ref 0.44–1.00)
GFR, Estimated: 40 mL/min — ABNORMAL LOW (ref >60)
Glucose, Bld: 135 mg/dL — ABNORMAL HIGH (ref 70–99)
Potassium: 4.6 mmol/L (ref 3.5–5.1)
Sodium: 141 mmol/L (ref 135–145)

## 2021-05-22 LAB — GLUCOSE, CAPILLARY
Glucose-Capillary: 156 mg/dL — ABNORMAL HIGH (ref 70–99)
Glucose-Capillary: 134 mg/dL — ABNORMAL HIGH (ref 70–99)

## 2021-05-22 MED ORDER — LOSARTAN POTASSIUM 25 MG PO TABS: 25.0000 mg | ORAL_TABLET | Freq: Every day | ORAL | 0 refills | Status: DC

## 2021-05-22 MED ORDER — APIXABAN 5 MG PO TABS: 5.0000 mg | ORAL_TABLET | Freq: Two times a day (BID) | ORAL | 0 refills | Status: DC

## 2021-05-22 MED ORDER — AMIODARONE HCL 200 MG PO TABS: 200.0000 mg | ORAL_TABLET | Freq: Two times a day (BID) | ORAL | 0 refills | Status: DC

## 2021-05-22 NOTE — TOC Transition Note (Addendum)
Transition of Care [facility]) - CM/SW Discharge Note   Patient Details  Name: Cheyenne Frank MRN: [id number] Date of Birth: 05-18-1941  Transition of Care [facility]) CM/SW Contact:  Gildardo Griffes, LCSW Phone Number: 05/22/2021, 9:31 AM   Clinical Narrative:     CSW notes patient to discharge home with home health services, CSW informed Pearson Grippe with [facility] of discharge today, is set up with [facility] PT and RN.   CSW reached out to [place] with [facility], reports oxygen should be delivered in the room within the next hour. RN informed. CSW notes family also requests 3in1, Danielle with [facility] informed. 3in1 to be delivered to the home.   Patient family to transport home today.   Eliquis coupon provided at bedside.   No other dc needs identified.    Final next level of care: Home w Home Health Services Barriers to Discharge: No Barriers Identified   Patient Goals and CMS Choice Patient states their goals for this hospitalization and ongoing recovery are:: to go home CMS Medicare.gov Compare Post Acute Care list provided to:: Patient Choice offered to / list presented to : Patient  Discharge Placement                Patient to be transferred to facility by: daughter Name of family member notified: Aram Beecham Patient and family notified of of transfer: 05/22/21  Discharge Plan and Services In-house Referral: Clinical Social Work   Post Acute Care Choice: Skilled Nursing Facility          DME Arranged: Oxygen DME Agency: [facility] Date DME Agency Contacted: 05/22/21 Time DME Agency Contacted: 0930 Representative spoke with at DME Agency: Leavy Cella HH Arranged: PT, RN HH Agency: [facility] Date HH Agency Contacted: 05/22/21 Time HH Agency Contacted: 0930 Representative spoke with at [facility] Agency: Pearson Grippe  Social Determinants of Health (SDOH) Interventions     Readmission Risk Interventions No flowsheet data found.

## 2021-05-22 NOTE — Progress Notes (Signed)
Cheyenne Frank to be D/C'd Home per MD order.  Discussed prescriptions and follow up appointments with the patient. Prescriptions electronically submitted medication list explained in detail. Pt verbalized understanding. Eliquis coupon given to patient. Person 02 tantank delivered to bedside  Allergies as of 05/22/2021       Reactions   Codeine Palpitations   Other Reaction: RACES HEART   Dilaudid [hydromorphone] Other (See Comments), Rash   Other Reaction: lightheaded, altered ms, nervous, Paranoia paranoid Other Reaction: lightheaded, altered ms, nervous, Paranoia   Demerol [meperidine] Other (See Comments), Rash   agitated Agitation   Pravastatin Other (See Comments)   Muscle aches   Lipitor [atorvastatin] Other (See Comments), Itching   unknown   Nifedipine Other (See Comments)   unknown   Statins Rash   Makes her crazy   Sulfa Antibiotics Rash   Vancomycin Rash   deafness        Medication List     STOP taking these medications    doxycycline 100 MG tablet Commonly known as: VIBRA-TABS   fluconazole 150 MG tablet Commonly known as: DIFLUCAN   nystatin cream Commonly known as: MYCOSTATIN   nystatin powder Generic drug: nystatin       TAKE these medications    acetaminophen 500 MG tablet Commonly known as: TYLENOL Take 500-1,000 mg by mouth every 6 (six) hours as needed for mild pain or fever.   albuterol 108 (90 Base) MCG/ACT inhaler Commonly known as: VENTOLIN HFA albuterol sulfate HFA 90 mcg/actuation aerosol inhaler  INHALE 2 PUFFS INTO LUNGS 4 TIMES DAILY   amiodarone 200 MG tablet Commonly known as: PACERONE Take 1 tablet (200 mg total) by mouth 2 (two) times daily.   aspirin EC 81 MG tablet Take 1 tablet (81 mg total) by mouth daily. MAY RESUME ON 12/31/20   carvedilol 25 MG tablet Commonly known as: COREG Take 25 mg by mouth 2 (two) times daily.   Eliquis 5 MG Tabs tablet Generic drug: apixaban Take 1 tablet (5 mg total) by mouth 2  (two) times daily. MAY RESUME ON 12/31/20 What changed: Another medication with the same name was added. Make sure you understand how and when to take each.   apixaban 5 MG Tabs tablet Commonly known as: Eliquis Take 1 tablet (5 mg total) by mouth 2 (two) times daily. What changed: You were already taking a medication with the same name, and this prescription was added. Make sure you understand how and when to take each.   furosemide 20 MG tablet Commonly known as: LASIX Take 20 mg by mouth daily.   gabapentin 300 MG capsule Commonly known as: NEURONTIN Take 300 mg by mouth 2 (two) times daily.   Incruse Ellipta 62.5 MCG/INH Aepb Generic drug: umeclidinium bromide Inhale 1 puff into the lungs.   insulin glargine 100 UNIT/ML injection Commonly known as: LANTUS Inject 77 Units into the skin at bedtime.   insulin lispro 100 UNIT/ML injection Commonly known as: HUMALOG Inject 22 Units into the skin 3 (three) times daily with meals.   ketoconazole 2 % cream Commonly known as: NIZORAL Apply topically daily.   liver oil-zinc oxide 40 % ointment Commonly known as: DESITIN Apply 1 application topically as needed for irritation.   losartan 25 MG tablet Commonly known as: COZAAR Take 1 tablet (25 mg total) by mouth daily. What changed:  medication strength how much to take   multivitamin Tabs tablet Take 1 tablet by mouth daily.   multivitamin with minerals Tabs  tablet Take 1 tablet by mouth daily.   omeprazole 40 MG capsule Commonly known as: PRILOSEC Take 40 mg by mouth daily.   PARoxetine 40 MG tablet Commonly known as: PAXIL Take 40 mg by mouth at bedtime.   rosuvastatin 5 MG tablet Commonly known as: CRESTOR Take 2.5 mg by mouth at bedtime.   sitaGLIPtin 50 MG tablet Commonly known as: JANUVIA Take 50 mg by mouth daily.   traZODone 50 MG tablet Commonly known as: DESYREL Take 50 mg by mouth at bedtime.               Durable Medical Equipment   (From admission, onward)           Start     Ordered   05/22/21 1210  For home use only DME 3 n 1  Once        05/22/21 1209   05/20/21 1737  For home use only DME oxygen  Once       Question Answer Comment  Length of Need 6 Months   Mode or (Route) Nasal cannula   Liters per Minute 2   Frequency Continuous (stationary and portable oxygen unit needed)   Oxygen conserving device Yes   Oxygen delivery system Gas      05/20/21 1737            Vitals:   05/22/21 0807 05/22/21 1133  BP: (!) 142/61 (!) 127/55  Pulse: (!) 59 61  Resp: 17 18  Temp: (!) 97.5 F (36.4 C) 97.8 F (36.6 C)  SpO2: 98% 98%    Skin clean, dry and intact without evidence of skin break down, no evidence of skin tears noted. IV catheter discontinued intact. Site without signs and symptoms of complications. Dressing and pressure applied. Pt denies pain at this time. No complaints noted.  An After Visit Summary was printed and given to the patient. Patient escorted via WC, and D/C home via private auto.  Cheyenne Frank Cheyenne Frank

## 2021-05-22 NOTE — Care Management Important Message (Signed)
Important Message  Patient Details  Name: Cheyenne Frank MRN: [id number] Date of Birth: Oct 23, 1940   Medicare Important Message Given:  Yes     Olegario Messier A Myria Steenbergen 05/22/2021, 12:02 PM

## 2021-05-22 NOTE — Discharge Summary (Signed)
Physician Discharge Summary  Cheyenne Frank KDX:[id number] DOB: 1941/07/07 DOA: 05/16/2021  PCP: [facility]  Admit date: 05/16/2021 Discharge date: 05/22/2021  Admitted From: Home Disposition:  Home  Discharge Condition:Stable CODE STATUS:FULL Diet recommendation: Heart Healthy   Brief/Interim Summary:  80 year old female with history of essential hypertension, sleep apnea, prior CVA, and paroxysmal atrial fibrillation on Eliquis for stroke prevention.  She presents to [facility] ED with chest pain, noted to be in atrial fibrillation with rapid ventricular rate  She is started on amiodarone drip and lasix drip on admission .  Cardiology was consulted and following here.  Overall status has improved.  PT/OT recommend home health.  She qualified for home oxygen.  She will follow-up with cardiology as an outpatient.  Medically stable for discharge to home today.  Following problems were addressed during hospitalization:  Afib/RVR, paroxysmal A. fib -Initially required amiodarone drip, converted to sinus rhythm --seen by cardiology, currently on amiodarone 200 mg twice a day, Coreg 25 mg twice a day, continue Eliquis for stroke prevention,  -She is to follow-up with cardiology in 1 week at which time amiodarone be tapered to 200mg  dail   13beats Wide-complex tachycardia  TSH 1.58 On Coreg and amiodarone Undergoing 14-day Holter to be placed prior to discharge   Acute on chronic diastolic heart failure exacerbation  -On Lasix 20 mg daily at home, she was treated with Lasix drip initially -lasix drip discontinued due to worsening renal function, lower extremity edema has resolved -Update echo with preserved left ventricular EF -plan to resume oral lasix 20mg   daily     Wheezing/respiratory insufficiency: Likely multifactorial. Chest x-ray no acute findings , initial CTA no acute lung findings, no PE  o2 dropped upon ambulation , discharge on  home o2, order placed   Continue home inhalers   HTN: Blood pressure stable on current regimen Cozaar held initially due to aki, cr improved, resume cozaar at a lower dose from 9/4   AKI on ckd IIIb Likely cardiorenal F/U BMP in a week   Insulin-dependent type 2 diabetes, uncontrolled On high-dose insulin at home A1c 8.2 Recommended to monitor blood sugars very closely   Class III obesity/OSA: Body mass index is 47.37 kg/m. Report not able to tolerate CPAP at night   Debility/deconditioning  pt eval initially recommended SNF, patient has showed improvement, repeat PT eval recommend home health     Discharge Diagnoses:  Active Problems:   Insulin dependent type 2 diabetes mellitus (HCC)   Atrial fibrillation with RVR (HCC)   Acute diastolic CHF (congestive heart failure) (HCC)   AKI (acute kidney injury) (HCC)   Obesity, Class III, BMI 40-49.9 (morbid obesity) (HCC)    Discharge Instructions  Discharge Instructions     Diet - low sodium heart healthy   Complete by: As directed    Discharge instructions   Complete by: As directed    1)Please follow-up with the PCP in a week.  Do a BMP just during the follow-up 2)You would be called by cardiology for the follow-up management 3)Monitor your blood sugars very closely at home.  Continue insulin.  You are on high-dose of insulin, which can cause low blood sugars, be very careful, regularly monitor your blood sugar   Increase activity slowly   Complete by: As directed       Allergies as of 05/22/2021       Reactions   Codeine Palpitations   Other Reaction: RACES HEART   Dilaudid [hydromorphone]  Other (See Comments), Rash   Other Reaction: lightheaded, altered ms, nervous, Paranoia paranoid Other Reaction: lightheaded, altered ms, nervous, Paranoia   Demerol [meperidine] Other (See Comments), Rash   agitated Agitation   Pravastatin Other (See Comments)   Muscle aches   Lipitor [atorvastatin] Other (See Comments), Itching   unknown    Nifedipine Other (See Comments)   unknown   Statins Rash   Makes her crazy   Sulfa Antibiotics Rash   Vancomycin Rash   deafness        Medication List     STOP taking these medications    doxycycline 100 MG tablet Commonly known as: VIBRA-TABS   fluconazole 150 MG tablet Commonly known as: DIFLUCAN   nystatin cream Commonly known as: MYCOSTATIN   nystatin powder Generic drug: nystatin       TAKE these medications    acetaminophen 500 MG tablet Commonly known as: TYLENOL Take 500-1,000 mg by mouth every 6 (six) hours as needed for mild pain or fever.   albuterol 108 (90 Base) MCG/ACT inhaler Commonly known as: VENTOLIN HFA albuterol sulfate HFA 90 mcg/actuation aerosol inhaler  INHALE 2 PUFFS INTO LUNGS 4 TIMES DAILY   amiodarone 200 MG tablet Commonly known as: PACERONE Take 1 tablet (200 mg total) by mouth 2 (two) times daily.   aspirin EC 81 MG tablet Take 1 tablet (81 mg total) by mouth daily. MAY RESUME ON 12/31/20   carvedilol 25 MG tablet Commonly known as: COREG Take 25 mg by mouth 2 (two) times daily.   Eliquis 5 MG Tabs tablet Generic drug: apixaban Take 1 tablet (5 mg total) by mouth 2 (two) times daily. MAY RESUME ON 12/31/20 What changed: Another medication with the same name was added. Make sure you understand how and when to take each.   apixaban 5 MG Tabs tablet Commonly known as: Eliquis Take 1 tablet (5 mg total) by mouth 2 (two) times daily. What changed: You were already taking a medication with the same name, and this prescription was added. Make sure you understand how and when to take each.   furosemide 20 MG tablet Commonly known as: LASIX Take 20 mg by mouth daily.   gabapentin 300 MG capsule Commonly known as: NEURONTIN Take 300 mg by mouth 2 (two) times daily.   Incruse Ellipta 62.5 MCG/INH Aepb Generic drug: umeclidinium bromide Inhale 1 puff into the lungs.   insulin glargine 100 UNIT/ML injection Commonly known  as: LANTUS Inject 77 Units into the skin at bedtime.   insulin lispro 100 UNIT/ML injection Commonly known as: HUMALOG Inject 22 Units into the skin 3 (three) times daily with meals.   ketoconazole 2 % cream Commonly known as: NIZORAL Apply topically daily.   liver oil-zinc oxide 40 % ointment Commonly known as: DESITIN Apply 1 application topically as needed for irritation.   losartan 25 MG tablet Commonly known as: COZAAR Take 1 tablet (25 mg total) by mouth daily. What changed:  medication strength how much to take   multivitamin Tabs tablet Take 1 tablet by mouth daily.   multivitamin with minerals Tabs tablet Take 1 tablet by mouth daily.   omeprazole 40 MG capsule Commonly known as: PRILOSEC Take 40 mg by mouth daily.   PARoxetine 40 MG tablet Commonly known as: PAXIL Take 40 mg by mouth at bedtime.   rosuvastatin 5 MG tablet Commonly known as: CRESTOR Take 2.5 mg by mouth at bedtime.   sitaGLIPtin 50 MG tablet Commonly known as: JANUVIA  Take 50 mg by mouth daily.   traZODone 50 MG tablet Commonly known as: DESYREL Take 50 mg by mouth at bedtime.               Durable Medical Equipment  (From admission, onward)           Start     Ordered   05/20/21 1737  For home use only DME oxygen  Once       Question Answer Comment  Length of Need 6 Months   Mode or (Route) Nasal cannula   Liters per Minute 2   Frequency Continuous (stationary and portable oxygen unit needed)   Oxygen conserving device Yes   Oxygen delivery system Gas      05/20/21 1737            Follow-up Information     [place]. Schedule an appointment as soon as possible for a visit in 1 week(s).   Specialty: Family Medicine Contact information: [address] [place] [id number] [phone]                Allergies  Allergen Reactions   Codeine Palpitations    Other Reaction: RACES HEART   Dilaudid  [Hydromorphone] Other (See Comments) and Rash    Other Reaction: lightheaded, altered ms, nervous, Paranoia paranoid Other Reaction: lightheaded, altered ms, nervous, Paranoia   Demerol [Meperidine] Other (See Comments) and Rash    agitated Agitation   Pravastatin Other (See Comments)    Muscle aches   Lipitor [Atorvastatin] Other (See Comments) and Itching    unknown   Nifedipine Other (See Comments)    unknown   Statins Rash    Makes her crazy   Sulfa Antibiotics Rash   Vancomycin Rash    deafness    Consultations: Cardiology   Procedures/Studies: DG Chest 1 View  Result Date: 05/16/2021 CLINICAL DATA:  Chest pain and shortness of breath. EXAM: CHEST  1 VIEW COMPARISON:  December 27, 2020 FINDINGS: There is no evidence of acute infiltrate, pleural effusion or pneumothorax. Mild prominence of the perihilar pulmonary vasculature is seen. The cardiac silhouette is borderline in size and unchanged in appearance. Multilevel degenerative changes seen throughout the thoracic spine. Bilateral radiopaque pedicle screws are also noted within the lower thoracic and upper lumbar spine. IMPRESSION: Findings consistent with mild pulmonary vascular congestion. Electronically Signed   By: Aram Candelahaddeus  Houston M.D.   On: 05/16/2021 18:41   CT Angio Chest PE W and/or Wo Contrast  Result Date: 05/16/2021 CLINICAL DATA:  Chest pain and shortness of breath EXAM: CT ANGIOGRAPHY CHEST WITH CONTRAST TECHNIQUE: Multidetector CT imaging of the chest was performed using the standard protocol during bolus administration of intravenous contrast. Multiplanar CT image reconstructions and MIPs were obtained to evaluate the vascular anatomy. CONTRAST:  75mL OMNIPAQUE IOHEXOL 350 MG/ML SOLN COMPARISON:  Chest radiograph dated 05/16/2021. FINDINGS: Cardiovascular: There is no cardiomegaly or pericardial effusion there is mild atherosclerotic calcification of the thoracic aorta. No aneurysmal dilatation. No pulmonary artery  embolus identified. Mediastinum/Nodes: There is no hilar adenopathy. Mildly enlarged rounded mediastinal lymph node to the right of the esophagus measures 12 mm (133/5). The esophagus and the thyroid gland are grossly unremarkable. No mediastinal fluid collection. Lungs/Pleura: The lungs are clear. There is no pleural effusion pneumothorax. The central airways are patent Upper Abdomen: Cholecystectomy. Musculoskeletal: Degenerative changes of the spine and osteopenia. No acute osseous pathology. Lower thoracic posterior fusion. Multiple old healed right rib fractures.  Review of the MIP images confirms the above findings. IMPRESSION: 1. No acute intrathoracic pathology. No CT evidence of pulmonary artery embolus. 2. Aortic Atherosclerosis (ICD10-I70.0). Electronically Signed   By: Elgie Collard M.D.   On: 05/16/2021 20:28   DG Chest Port 1 View  Result Date: 05/18/2021 CLINICAL DATA:  80 year old female with history of wheezing. Shortness of breath. EXAM: PORTABLE CHEST 1 VIEW COMPARISON:  Chest x-ray 05/16/2021. FINDINGS: Lung volumes are normal. No consolidative airspace disease. No pleural effusions. No pneumothorax. No evidence of pulmonary edema. Heart size is mildly enlarged. The patient is rotated to the right on today's exam and the patient is in a very lordotic position, resulting in distortion of the mediastinal contours and reduced diagnostic sensitivity and specificity for mediastinal pathology. IMPRESSION: 1. No radiographic evidence of acute cardiopulmonary disease. 2. Mild cardiomegaly. Electronically Signed   By: Trudie Reed M.D.   On: 05/18/2021 15:48   ECHOCARDIOGRAM COMPLETE  Result Date: 05/17/2021    ECHOCARDIOGRAM REPORT   Patient Name:   Cheyenne Frank Date of Exam: 05/17/2021 Medical Rec #:  [id number]       Height:       62.0 in Accession #:    [id number]      Weight:       264.9 lb Date of Birth:  [birth year]        BSA:          2.154 m Patient Age:    80 years        BP:            120/59 mmHg Patient Gender: F               HR:           72 bpm. Exam Location:  [facility] Procedure: 2D Echo, Color Doppler and Cardiac Doppler Indications:     Atrial Fibrillation I48.91                  CHF-acute diastolic I50.31  History:         Patient has no prior history of Echocardiogram examinations.                  Stroke; Risk Factors:Diabetes and Sleep Apnea.  Sonographer:     Cristela Blue Referring Phys:  [id number] JAN A MANSY Diagnosing Phys: Debbe Odea MD  Sonographer Comments: No apical window and Technically challenging study due to limited acoustic windows. IMPRESSIONS  1. Left ventricular ejection fraction, by estimation, is 55 to 60%. The left ventricle has normal function. The left ventricle has no regional wall motion abnormalities. There is mild left ventricular hypertrophy. Left ventricular diastolic function could not be evaluated.  2. Right ventricular systolic function is normal. The right ventricular size is normal.  3. The mitral valve is grossly normal. No evidence of mitral valve regurgitation. Moderate mitral annular calcification.  4. The aortic valve is grossly normal. Aortic valve regurgitation is not visualized. FINDINGS  Left Ventricle: Left ventricular ejection fraction, by estimation, is 55 to 60%. The left ventricle has normal function. The left ventricle has no regional wall motion abnormalities. The left ventricular internal cavity size was normal in size. There is  mild left ventricular hypertrophy. Left ventricular diastolic function could not be evaluated. Right Ventricle: The right ventricular size is normal. No increase in right ventricular wall thickness. Right ventricular systolic function is normal. Left Atrium: Left atrial size was normal in size. Right Atrium: Right atrial size was  not well visualized. Pericardium: There is no evidence of pericardial effusion. Mitral Valve: The mitral valve is grossly normal. Moderate mitral annular calcification. No evidence  of mitral valve regurgitation. Tricuspid Valve: The tricuspid valve is normal in structure. Tricuspid valve regurgitation is not demonstrated. Aortic Valve: The aortic valve is grossly normal. Aortic valve regurgitation is not visualized. Pulmonic Valve: The pulmonic valve was not well visualized. Pulmonic valve regurgitation is not visualized. Aorta: The aortic root is normal in size and structure. Venous: The inferior vena cava was not well visualized. IAS/Shunts: No atrial level shunt detected by color flow Doppler.  LEFT VENTRICLE PLAX 2D LVIDd:         3.60 cm LVIDs:         2.50 cm LV PW:         1.70 cm LV IVS:        1.80 cm LVOT diam:     2.00 cm LVOT Area:     3.14 cm  LEFT ATRIUM         Index LA diam:    3.90 cm 1.81 cm/m                        PULMONIC VALVE AORTA                 PV Vmax:          1.17 m/s Ao Root diam: 3.13 cm PV Peak grad:     5.5 mmHg                       PR End Diast Vel: 2.74 msec                       RVOT Peak grad:   6 mmHg   SHUNTS Systemic Diam: 2.00 cm Debbe Odea MD Electronically signed by Debbe Odea MD Signature Date/Time: 05/17/2021/2:28:37 PM    Final       Subjective: Patient seen and examined the bedside this morning.  Hemodynamically stable for discharge today.I called the daughter and discussed about the discharge planning  Discharge Exam: Vitals:   05/22/21 0459 05/22/21 0807  BP: (!) 151/77 (!) 142/61  Pulse: 62 (!) 59  Resp:  17  Temp: 97.8 F (36.6 C) (!) 97.5 F (36.4 C)  SpO2: 100% 98%   Vitals:   05/21/21 2003 05/22/21 0030 05/22/21 0459 05/22/21 0807  BP:  (!) 141/57 (!) 151/77 (!) 142/61  Pulse:  64 62 (!) 59  Resp: 18 16  17   Temp:  98 F (36.7 C) 97.8 F (36.6 C) (!) 97.5 F (36.4 C)  TempSrc:  Oral Oral   SpO2:  99% 100% 98%  Weight:      Height:        General: Pt is alert, awake, not in acute distress Cardiovascular: RRR, S1/S2 +, no rubs, no gallops Respiratory: CTA bilaterally, no wheezing, no  rhonchi Abdominal: Soft, NT, ND, bowel sounds + Extremities: no edema, no cyanosis    The results of significant diagnostics from this hospitalization (including imaging, microbiology, ancillary and laboratory) are listed below for reference.     Microbiology: Recent Results (from the past 240 hour(s))  Resp Panel by RT-PCR (Flu A&B, Covid) Nasopharyngeal Swab     Status: None   Collection Time: 05/16/21  8:07 PM   Specimen: Nasopharyngeal Swab; Nasopharyngeal(NP) swabs in vial transport medium  Result Value Ref Range Status  SARS Coronavirus 2 by RT PCR NEGATIVE NEGATIVE Final    Comment: (NOTE) SARS-CoV-2 target nucleic acids are NOT DETECTED.  The SARS-CoV-2 RNA is generally detectable in upper respiratory specimens during the acute phase of infection. The lowest concentration of SARS-CoV-2 viral copies this assay can detect is 138 copies/mL. A negative result does not preclude SARS-Cov-2 infection and should not be used as the sole basis for treatment or other patient management decisions. A negative result may occur with  improper specimen collection/handling, submission of specimen other than nasopharyngeal swab, presence of viral mutation(s) within the areas targeted by this assay, and inadequate number of viral copies(<138 copies/mL). A negative result must be combined with clinical observations, patient history, and epidemiological information. The expected result is Negative.  Fact Sheet for Patients:  BloggerCourse.com  Fact Sheet for Healthcare Providers:  SeriousBroker.it  This test is no t yet approved or cleared by the [place] FDA and  has been authorized for detection and/or diagnosis of SARS-CoV-2 by FDA under an Emergency Use Authorization (EUA). This EUA will remain  in effect (meaning this test can be used) for the duration of the COVID-19 declaration under Section 564(b)(1) of the Act,  21 U.S.C.section 360bbb-3(b)(1), unless the authorization is terminated  or revoked sooner.       Influenza A by PCR NEGATIVE NEGATIVE Final   Influenza B by PCR NEGATIVE NEGATIVE Final    Comment: (NOTE) The Xpert Xpress SARS-CoV-2/FLU/RSV plus assay is intended as an aid in the diagnosis of influenza from Nasopharyngeal swab specimens and should not be used as a sole basis for treatment. Nasal washings and aspirates are unacceptable for Xpert Xpress SARS-CoV-2/FLU/RSV testing.  Fact Sheet for Patients: BloggerCourse.com  Fact Sheet for Healthcare Providers: SeriousBroker.it  This test is not yet approved or cleared by the [place] FDA and has been authorized for detection and/or diagnosis of SARS-CoV-2 by FDA under an Emergency Use Authorization (EUA). This EUA will remain in effect (meaning this test can be used) for the duration of the COVID-19 declaration under Section 564(b)(1) of the Act, 21 U.S.C. section 360bbb-3(b)(1), unless the authorization is terminated or revoked.  Performed at [facility], [address] [place], [place] [address]      Labs: BNP (last 3 results) Recent Labs    05/16/21 1745  BNP 70.7   Basic Metabolic Panel: Recent Labs  Lab 05/16/21 2007 05/17/21 0518 05/17/21 1528 05/18/21 0504 05/19/21 0501 05/20/21 0443 05/21/21 0438 05/22/21 0509  NA  --    < > 138 141 141 144 142 141  K  --    < > 3.9 4.0 4.2 4.2 4.8 4.6  CL  --    < > 97* 97* 99 102 101 101  CO2  --    < > 31 37* 35* 38* 34* 36*  GLUCOSE  --    < > 181* 122* 147* 130* 211* 135*  BUN  --    < > 40* 42* 39* 38* 42* 41*  CREATININE  --    < > 1.93* 1.76* 1.62* 1.22* 1.67* 1.36*  CALCIUM  --    < > 8.8* 9.1 9.4 9.2 9.4 9.2  MG 1.9  --   --  2.3  --   --  2.6*  --   PHOS  --   --  5.4*  --   --   --   --   --    < > = values in this interval not  displayed.   Liver Function Tests: Recent Labs  Lab  05/16/21 1745 05/17/21 1528  AST 28  --   ALT 23  --   ALKPHOS 85  --   BILITOT 0.8  --   PROT 6.9  --   ALBUMIN 3.4* 3.8   No results for input(s): LIPASE, AMYLASE in the last 168 hours. No results for input(s): AMMONIA in the last 168 hours. CBC: Recent Labs  Lab 05/16/21 1745 05/17/21 0518  WBC 8.3 7.2  NEUTROABS 5.5  --   HGB 14.5 13.5  HCT 43.3 40.2  MCV 92.3 92.0  PLT 160 143*   Cardiac Enzymes: No results for input(s): CKTOTAL, CKMB, CKMBINDEX, TROPONINI in the last 168 hours. BNP: Invalid input(s): POCBNP CBG: Recent Labs  Lab 05/20/21 2034 05/21/21 0914 05/21/21 1145 05/21/21 1611 05/21/21 2155  GLUCAP 157* 157* 240* 275* 260*   D-Dimer No results for input(s): DDIMER in the last 72 hours. Hgb A1c No results for input(s): HGBA1C in the last 72 hours. Lipid Profile No results for input(s): CHOL, HDL, LDLCALC, TRIG, CHOLHDL, LDLDIRECT in the last 72 hours. Thyroid function studies No results for input(s): TSH, T4TOTAL, T3FREE, THYROIDAB in the last 72 hours.  Invalid input(s): FREET3 Anemia work up No results for input(s): VITAMINB12, FOLATE, FERRITIN, TIBC, IRON, RETICCTPCT in the last 72 hours. Urinalysis    Component Value Date/Time   COLORURINE YELLOW 05/21/2021 1005   APPEARANCEUR CLEAR 05/21/2021 1005   APPEARANCEUR Clear 06/29/2014 1138   LABSPEC 1.025 05/21/2021 1005   LABSPEC 1.019 06/29/2014 1138   PHURINE 5.5 05/21/2021 1005   GLUCOSEU NEGATIVE 05/21/2021 1005   GLUCOSEU Negative 06/29/2014 1138   HGBUR MODERATE (A) 05/21/2021 1005   BILIRUBINUR NEGATIVE 05/21/2021 1005   BILIRUBINUR Negative 06/29/2014 1138   KETONESUR NEGATIVE 05/21/2021 1005   PROTEINUR >300 (A) 05/21/2021 1005   UROBILINOGEN 0.2 12/14/2013 1737   NITRITE NEGATIVE 05/21/2021 1005   LEUKOCYTESUR NEGATIVE 05/21/2021 1005   LEUKOCYTESUR Negative 06/29/2014 1138   Sepsis Labs Invalid input(s): PROCALCITONIN,  WBC,  LACTICIDVEN Microbiology Recent Results (from  the past 240 hour(s))  Resp Panel by RT-PCR (Flu A&B, Covid) Nasopharyngeal Swab     Status: None   Collection Time: 05/16/21  8:07 PM   Specimen: Nasopharyngeal Swab; Nasopharyngeal(NP) swabs in vial transport medium  Result Value Ref Range Status   SARS Coronavirus 2 by RT PCR NEGATIVE NEGATIVE Final    Comment: (NOTE) SARS-CoV-2 target nucleic acids are NOT DETECTED.  The SARS-CoV-2 RNA is generally detectable in upper respiratory specimens during the acute phase of infection. The lowest concentration of SARS-CoV-2 viral copies this assay can detect is 138 copies/mL. A negative result does not preclude SARS-Cov-2 infection and should not be used as the sole basis for treatment or other patient management decisions. A negative result may occur with  improper specimen collection/handling, submission of specimen other than nasopharyngeal swab, presence of viral mutation(s) within the areas targeted by this assay, and inadequate number of viral copies(<138 copies/mL). A negative result must be combined with clinical observations, patient history, and epidemiological information. The expected result is Negative.  Fact Sheet for Patients:  BloggerCourse.com  Fact Sheet for Healthcare Providers:  SeriousBroker.it  This test is no t yet approved or cleared by the [place] FDA and  has been authorized for detection and/or diagnosis of SARS-CoV-2 by FDA under an Emergency Use Authorization (EUA). This EUA will remain  in effect (meaning this test can be used) for the duration of  the COVID-19 declaration under Section 564(b)(1) of the Act, 21 U.S.C.section 360bbb-3(b)(1), unless the authorization is terminated  or revoked sooner.       Influenza A by PCR NEGATIVE NEGATIVE Final   Influenza B by PCR NEGATIVE NEGATIVE Final    Comment: (NOTE) The Xpert Xpress SARS-CoV-2/FLU/RSV plus assay is intended as an aid in the diagnosis of  influenza from Nasopharyngeal swab specimens and should not be used as a sole basis for treatment. Nasal washings and aspirates are unacceptable for Xpert Xpress SARS-CoV-2/FLU/RSV testing.  Fact Sheet for Patients: BloggerCourse.com  Fact Sheet for Healthcare Providers: SeriousBroker.it  This test is not yet approved or cleared by the [place] FDA and has been authorized for detection and/or diagnosis of SARS-CoV-2 by FDA under an Emergency Use Authorization (EUA). This EUA will remain in effect (meaning this test can be used) for the duration of the COVID-19 declaration under Section 564(b)(1) of the Act, 21 U.S.C. section 360bbb-3(b)(1), unless the authorization is terminated or revoked.  Performed at [facility], [address], [place], [place] [address]     Please note: You were cared for by a hospitalist during your hospital stay. Once you are discharged, your primary care physician will handle any further medical issues. Please note that NO REFILLS for any discharge medications will be authorized once you are discharged, as it is imperative that you return to your primary care physician (or establish a relationship with a primary care physician if you do not have one) for your post hospital discharge needs so that they can reassess your need for medications and monitor your lab values.    Time coordinating discharge: 40 minutes  SIGNED:   Burnadette Pop, MD  [facility] 05/22/2021, 9:04 AM Pager [phone]  If 7PM-7AM, please contact night-coverage [email or web address] Password [email or web address]

## 2021-09-16 ENCOUNTER — Encounter: Payer: Self-pay | Admitting: Family Medicine

## 2021-09-16 ENCOUNTER — Other Ambulatory Visit: Payer: Self-pay

## 2021-09-16 ENCOUNTER — Emergency Department: Payer: Medicare Other

## 2021-09-16 ENCOUNTER — Observation Stay
Admission: EM | Admit: 2021-09-16 | Discharge: 2021-09-17 | Disposition: A | Discharge status: Home / Self Care | Payer: Medicare Other | Attending: Family Medicine | Admitting: Family Medicine

## 2021-09-16 DIAGNOSIS — Z79899 Other long term (current) drug therapy: Secondary | ICD-10-CM | POA: Diagnosis not present

## 2021-09-16 DIAGNOSIS — R079 Chest pain, unspecified: Secondary | ICD-10-CM

## 2021-09-16 DIAGNOSIS — E1142 Type 2 diabetes mellitus with diabetic polyneuropathy: Secondary | ICD-10-CM | POA: Diagnosis not present

## 2021-09-16 DIAGNOSIS — Z7982 Long term (current) use of aspirin: Secondary | ICD-10-CM | POA: Diagnosis not present

## 2021-09-16 DIAGNOSIS — F32A Depression, unspecified: Secondary | ICD-10-CM | POA: Diagnosis present

## 2021-09-16 DIAGNOSIS — J449 Chronic obstructive pulmonary disease, unspecified: Secondary | ICD-10-CM | POA: Diagnosis not present

## 2021-09-16 DIAGNOSIS — E1165 Type 2 diabetes mellitus with hyperglycemia: Secondary | ICD-10-CM

## 2021-09-16 DIAGNOSIS — I10 Essential (primary) hypertension: Secondary | ICD-10-CM | POA: Diagnosis present

## 2021-09-16 DIAGNOSIS — N1832 Chronic kidney disease, stage 3b: Secondary | ICD-10-CM | POA: Diagnosis present

## 2021-09-16 DIAGNOSIS — I4891 Unspecified atrial fibrillation: Secondary | ICD-10-CM | POA: Insufficient documentation

## 2021-09-16 DIAGNOSIS — I130 Hypertensive heart and chronic kidney disease with heart failure and stage 1 through stage 4 chronic kidney disease, or unspecified chronic kidney disease: Secondary | ICD-10-CM | POA: Diagnosis not present

## 2021-09-16 DIAGNOSIS — I679 Cerebrovascular disease, unspecified: Secondary | ICD-10-CM

## 2021-09-16 DIAGNOSIS — G459 Transient cerebral ischemic attack, unspecified: Secondary | ICD-10-CM | POA: Diagnosis present

## 2021-09-16 DIAGNOSIS — Z20822 Contact with and (suspected) exposure to covid-19: Secondary | ICD-10-CM | POA: Insufficient documentation

## 2021-09-16 DIAGNOSIS — Z794 Long term (current) use of insulin: Secondary | ICD-10-CM | POA: Diagnosis not present

## 2021-09-16 DIAGNOSIS — G4730 Sleep apnea, unspecified: Secondary | ICD-10-CM | POA: Diagnosis present

## 2021-09-16 DIAGNOSIS — I4892 Unspecified atrial flutter: Secondary | ICD-10-CM | POA: Diagnosis present

## 2021-09-16 DIAGNOSIS — N183 Chronic kidney disease, stage 3 unspecified: Secondary | ICD-10-CM | POA: Diagnosis not present

## 2021-09-16 DIAGNOSIS — I5033 Acute on chronic diastolic (congestive) heart failure: Secondary | ICD-10-CM | POA: Diagnosis not present

## 2021-09-16 DIAGNOSIS — E66813 Obesity, class 3: Secondary | ICD-10-CM | POA: Diagnosis present

## 2021-09-16 DIAGNOSIS — R0602 Shortness of breath: Secondary | ICD-10-CM | POA: Diagnosis present

## 2021-09-16 LAB — CBC WITH DIFFERENTIAL/PLATELET
Abs Immature Granulocytes: 0.03 10*3/uL (ref 0.00–0.07)
Basophils Absolute: 0 10*3/uL (ref 0.0–0.1)
Basophils Relative: 0 %
Eosinophils Absolute: 0.1 10*3/uL (ref 0.0–0.5)
Eosinophils Relative: 2 %
HCT: 42.9 % (ref 36.0–46.0)
Hemoglobin: 13.6 g/dL (ref 12.0–15.0)
Immature Granulocytes: 0 %
Lymphocytes Relative: 18 %
Lymphs Abs: 1.2 10*3/uL (ref 0.7–4.0)
MCH: 31 pg (ref 26.0–34.0)
MCHC: 31.7 g/dL (ref 30.0–36.0)
MCV: 97.7 fL (ref 80.0–100.0)
Monocytes Absolute: 0.7 10*3/uL (ref 0.1–1.0)
Monocytes Relative: 10 %
Neutro Abs: 5 10*3/uL (ref 1.7–7.7)
Neutrophils Relative %: 70 %
Platelets: 129 10*3/uL — ABNORMAL LOW (ref 150–400)
RBC: 4.39 MIL/uL (ref 3.87–5.11)
RDW: 13.6 % (ref 11.5–15.5)
WBC: 7.1 10*3/uL (ref 4.0–10.5)
nRBC: 0 % (ref 0.0–0.2)

## 2021-09-16 LAB — BASIC METABOLIC PANEL
Anion gap: 7 (ref 5–15)
BUN: 29 mg/dL — ABNORMAL HIGH (ref 8–23)
CO2: 31 mmol/L (ref 22–32)
Calcium: 9.3 mg/dL (ref 8.9–10.3)
Chloride: 102 mmol/L (ref 98–111)
Creatinine, Ser: 1.38 mg/dL — ABNORMAL HIGH (ref 0.44–1.00)
GFR, Estimated: 39 mL/min — ABNORMAL LOW (ref >60)
Glucose, Bld: 184 mg/dL — ABNORMAL HIGH (ref 70–99)
Potassium: 4.2 mmol/L (ref 3.5–5.1)
Sodium: 140 mmol/L (ref 135–145)

## 2021-09-16 LAB — TROPONIN I (HIGH SENSITIVITY)
Troponin I (High Sensitivity): 10 ng/L (ref <18)
Troponin I (High Sensitivity): 14 ng/L (ref <18)

## 2021-09-16 LAB — MAGNESIUM: Magnesium: 2.1 mg/dL (ref 1.7–2.4)

## 2021-09-16 LAB — RESP PANEL BY RT-PCR (FLU A&B, COVID) ARPGX2
Influenza A by PCR: NEGATIVE
Influenza B by PCR: NEGATIVE
SARS Coronavirus 2 by RT PCR: NEGATIVE

## 2021-09-16 LAB — BRAIN NATRIURETIC PEPTIDE: B Natriuretic Peptide: 64.9 pg/mL (ref 0.0–100.0)

## 2021-09-16 LAB — CBG MONITORING, ED: Glucose-Capillary: 114 mg/dL — ABNORMAL HIGH (ref 70–99)

## 2021-09-16 MED ORDER — INSULIN ASPART 100 UNIT/ML IJ SOLN: 0.0000 [IU] | Freq: Every day | INTRAMUSCULAR | Status: DC

## 2021-09-16 MED ORDER — INSULIN GLARGINE-YFGN 100 UNIT/ML ~~LOC~~ SOLN
40.0000 [IU] | Freq: Every day | SUBCUTANEOUS | Status: DC
  Administered 2021-09-17: 40 [IU] via SUBCUTANEOUS
  Filled 2021-09-16 (×2): qty 0.4

## 2021-09-16 MED ORDER — GABAPENTIN 300 MG PO CAPS
300.0000 mg | ORAL_CAPSULE | Freq: Two times a day (BID) | ORAL | Status: DC
  Administered 2021-09-16 – 2021-09-17 (×2): 300 mg via ORAL
  Filled 2021-09-16 (×2): qty 1

## 2021-09-16 MED ORDER — DILTIAZEM HCL-DEXTROSE 125-5 MG/125ML-% IV SOLN (PREMIX)
5.0000 mg/h | INTRAVENOUS | Status: DC
  Administered 2021-09-16 (×3): 5 mg/h via INTRAVENOUS
  Administered 2021-09-16: 7 mg/h via INTRAVENOUS
  Filled 2021-09-16: qty 125

## 2021-09-16 MED ORDER — INSULIN ASPART 100 UNIT/ML IJ SOLN
0.0000 [IU] | Freq: Three times a day (TID) | INTRAMUSCULAR | Status: DC
  Administered 2021-09-17: 3 [IU] via SUBCUTANEOUS
  Filled 2021-09-16: qty 1

## 2021-09-16 MED ORDER — INSULIN ASPART 100 UNIT/ML IJ SOLN
6.0000 [IU] | Freq: Three times a day (TID) | INTRAMUSCULAR | Status: DC
  Administered 2021-09-17: 6 [IU] via SUBCUTANEOUS
  Filled 2021-09-16: qty 1

## 2021-09-16 MED ORDER — NITROGLYCERIN 0.4 MG SL SUBL
0.4000 mg | SUBLINGUAL_TABLET | SUBLINGUAL | Status: DC | PRN
  Administered 2021-09-16 (×2): 0.4 mg via SUBLINGUAL
  Filled 2021-09-16: qty 1

## 2021-09-16 MED ORDER — FUROSEMIDE 10 MG/ML IJ SOLN
40.0000 mg | Freq: Two times a day (BID) | INTRAMUSCULAR | Status: DC
  Administered 2021-09-17 (×2): 40 mg via INTRAVENOUS
  Filled 2021-09-16 (×2): qty 4

## 2021-09-16 MED ORDER — TRAZODONE HCL 50 MG PO TABS
50.0000 mg | ORAL_TABLET | Freq: Every day | ORAL | Status: DC
  Administered 2021-09-16: 50 mg via ORAL
  Filled 2021-09-16: qty 1

## 2021-09-16 MED ORDER — DILTIAZEM LOAD VIA INFUSION
10.0000 mg | Freq: Once | INTRAVENOUS | Status: AC
  Administered 2021-09-16: 10 mg via INTRAVENOUS
  Filled 2021-09-16: qty 10

## 2021-09-16 MED ORDER — ONDANSETRON HCL 4 MG PO TABS: 4.0000 mg | ORAL_TABLET | Freq: Four times a day (QID) | ORAL | Status: DC | PRN

## 2021-09-16 MED ORDER — ACETAMINOPHEN 325 MG PO TABS: 650.0000 mg | ORAL_TABLET | Freq: Four times a day (QID) | ORAL | Status: DC | PRN

## 2021-09-16 MED ORDER — POTASSIUM CHLORIDE CRYS ER 20 MEQ PO TBCR
20.0000 meq | EXTENDED_RELEASE_TABLET | Freq: Two times a day (BID) | ORAL | Status: DC
  Administered 2021-09-16 – 2021-09-17 (×2): 20 meq via ORAL
  Filled 2021-09-16 (×2): qty 1

## 2021-09-16 MED ORDER — ONDANSETRON HCL 4 MG/2ML IJ SOLN: 4.0000 mg | Freq: Four times a day (QID) | INTRAMUSCULAR | Status: DC | PRN

## 2021-09-16 MED ORDER — CARVEDILOL 25 MG PO TABS
25.0000 mg | ORAL_TABLET | Freq: Two times a day (BID) | ORAL | Status: DC
  Administered 2021-09-16 – 2021-09-17 (×3): 25 mg via ORAL
  Filled 2021-09-16 (×3): qty 1

## 2021-09-16 MED ORDER — APIXABAN 5 MG PO TABS
5.0000 mg | ORAL_TABLET | Freq: Two times a day (BID) | ORAL | Status: DC
  Administered 2021-09-16 – 2021-09-17 (×2): 5 mg via ORAL
  Filled 2021-09-16 (×2): qty 1

## 2021-09-16 MED ORDER — AMIODARONE HCL 200 MG PO TABS
200.0000 mg | ORAL_TABLET | Freq: Two times a day (BID) | ORAL | Status: DC
  Administered 2021-09-16 – 2021-09-17 (×2): 200 mg via ORAL
  Filled 2021-09-16 (×2): qty 1

## 2021-09-16 MED ORDER — ROSUVASTATIN CALCIUM 5 MG PO TABS
2.5000 mg | ORAL_TABLET | Freq: Every day | ORAL | Status: DC
  Administered 2021-09-17: 2.5 mg via ORAL
  Filled 2021-09-16 (×2): qty 0.5

## 2021-09-16 MED ORDER — UMECLIDINIUM BROMIDE 62.5 MCG/ACT IN AEPB
1.0000 | INHALATION_SPRAY | Freq: Every day | RESPIRATORY_TRACT | Status: DC
  Administered 2021-09-17: 1 via RESPIRATORY_TRACT
  Filled 2021-09-16: qty 7

## 2021-09-16 MED ORDER — PAROXETINE HCL 20 MG PO TABS
40.0000 mg | ORAL_TABLET | Freq: Every day | ORAL | Status: DC
  Administered 2021-09-17: 40 mg via ORAL
  Filled 2021-09-16 (×3): qty 2

## 2021-09-16 MED ORDER — FUROSEMIDE 10 MG/ML IJ SOLN
80.0000 mg | Freq: Once | INTRAMUSCULAR | Status: AC
  Administered 2021-09-16: 80 mg via INTRAVENOUS
  Filled 2021-09-16: qty 8

## 2021-09-16 MED ORDER — LINAGLIPTIN 5 MG PO TABS
5.0000 mg | ORAL_TABLET | Freq: Every day | ORAL | Status: DC
  Filled 2021-09-16 (×2): qty 1

## 2021-09-16 MED ORDER — ACETAMINOPHEN 325 MG RE SUPP: 650.0000 mg | Freq: Four times a day (QID) | RECTAL | Status: DC | PRN

## 2021-09-16 NOTE — Assessment & Plan Note (Signed)
A1c 8.2% in Sep - Continue Lantus (may need to adjust up) - Continue Mealtime insulin - High dose SS correction insulin -Continue gabapentin -Continue Crestor

## 2021-09-16 NOTE — Assessment & Plan Note (Signed)
-  Continue Crestor -Continue Eliquis

## 2021-09-16 NOTE — Assessment & Plan Note (Signed)
BMI 49 

## 2021-09-16 NOTE — ED Triage Notes (Signed)
BIB [facility] from home. Pt has CHF. CP x 2 hours. Worsening. 12lead for EMS shows AFIB. Pitting edema of hands and feet. SOB. 2 SL NTG. No oxygen at home. EMS placed on 2 liters due to drop in sat to 91%. Pt took 4 baby ASA at home,.  154/84 120 HR  RR 16

## 2021-09-16 NOTE — Assessment & Plan Note (Signed)
-   Hold losartan for now - Continue Coreg

## 2021-09-16 NOTE — Hospital Course (Signed)
Mrs. Shane is a 80 y.o. 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  Patient does not weigh herself daily but started to notice weight gain and swelling over a week ago.  In the last several days, she has noted shortness of breath, and then today she felt chest pressure and chest discomfort so she came to the ER.  In the ER heart rate 130, and atrial flutter.  Blood pressure normal.  Chest x-ray showed pulmonary edema.  She was given Coreg, started on diltiazem drip, and given IV Lasix.

## 2021-09-16 NOTE — ED Notes (Signed)
EDP at bedside  

## 2021-09-16 NOTE — ED Notes (Signed)
Dr Maryfrances Bunnell evaluated pt again at bedside. Per Dr Maryfrances Bunnell, night RN can titrate diltiazem to 5mg /hr and see how pt does. A flutter and high HR will also resolve as pt is diuresed as it is likely exacerbated by fluid overload.

## 2021-09-16 NOTE — Assessment & Plan Note (Signed)
Continue Paxil.

## 2021-09-16 NOTE — Assessment & Plan Note (Signed)
Cr at baseline 1.2-1.6

## 2021-09-16 NOTE — ED Provider Notes (Signed)
Admission discussed and patient accepted by Dr. Joen Laura MD.   Sharyn Creamer, MD 09/16/21 [phone]

## 2021-09-16 NOTE — ED Provider Notes (Signed)
[facility] Emergency Department Provider Note  ____________________________________________   Event Date/Time   First MD Initiated Contact with Patient 09/16/21 1332     (approximate)  I have reviewed the triage vital signs and the nursing notes.   HISTORY  Chief Complaint Shortness of Breath   HPI Cheyenne Frank is a 80 y.o. female with with history of essential hypertension, sleep apnea, prior CVA, and paroxysmal atrial fibrillation on Eliquis and recent admission in September of this year for A. fib with RVR as well as acute on chronic CHF exacerbation who presents EMS from home for assessment of left-sided chest pressure rating down the left arm starting about 2 hours prior to EMS being called in the setting of 3 to 4 days of worsening shortness of breath.  Patient states she is also been coughing.  She states he has been gaining weight but is not sure.  She has been taking her Lasix as well as all of her other medications including her Eliquis.  She denies any fevers, abdominal pain, vomiting, diarrhea, rash, burning with urination         Past Medical History:  Diagnosis Date   Anemia of chronic disease    Anxiety    Arthritis    Asthma    no problems since 1999 (after moving into a new home)   Chronic airway obstruction (HCC)    Complication of anesthesia    " feel like I'm dying after I wake up"   Diabetes mellitus without complication (HCC)    Diverticulosis    Dysrhythmia    unknown type    Fibromyalgia    Flat back syndrome    GERD (gastroesophageal reflux disease)    Hx of transfusion 2011   Hypertension    Itching    Macular degeneration    Nocturia    Restless leg syndrome    Sleep apnea    Dx 1990's unable to wear c-pap -   Stroke (HCC) 2010   verbal aphasia x 30 min - resolved - no problem since then    Patient Active Problem List   Diagnosis Date Noted   Acute diastolic CHF (congestive heart failure) (HCC)    AKI  (acute kidney injury) (HCC)    Obesity, Class III, BMI 40-49.9 (morbid obesity) (HCC)    Atrial fibrillation with RVR (HCC) 05/16/2021   Head injury 12/27/2020   SDH (subdural hematoma) 12/11/2013   Postoperative wound infection--lumbar spine/2015 12/11/2013   CVA (cerebral infarction) 12/11/2013   Postoperative anemia due to acute blood loss 04/09/2013   Insulin dependent type 2 diabetes mellitus (HCC) 04/07/2013   Hypertension 04/07/2013   Unspecified sleep apnea 04/07/2013   OA (osteoarthritis) of knee 04/06/2013    Past Surgical History:  Procedure Laterality Date   ABDOMINAL HYSTERECTOMY     rt so   BACK SURGERY  2010 / 2011   BREAST REDUCTION SURGERY     BURR HOLE FOR SUBDURAL HEMATOMA  2015   cataracts removed     CHOLECYSTECTOMY  1990   DILATION AND CURETTAGE OF UTERUS     HERNIA REPAIR     POSTERIOR LAMINECTOMY / DECOMPRESSION LUMBAR SPINE     with T10-S1 fusion   TONSILLECTOMY     TOTAL KNEE ARTHROPLASTY Left 04/06/2013   Procedure: LEFT TOTAL KNEE ARTHROPLASTY;  Surgeon: Loanne Drilling, MD;  Location: [facility];  Service: Orthopedics;  Laterality: Left;    Prior to Admission medications   Medication Sig Start  Date End Date Taking? Authorizing Provider  acetaminophen (TYLENOL) 500 MG tablet Take 500-1,000 mg by mouth every 6 (six) hours as needed for mild pain or fever.     [provider]  albuterol (VENTOLIN HFA) 108 (90 Base) MCG/ACT inhaler albuterol sulfate HFA 90 mcg/actuation aerosol inhaler  INHALE 2 PUFFS INTO LUNGS 4 TIMES DAILY 05/19/20   [provider]  amiodarone (PACERONE) 200 MG tablet Take 1 tablet (200 mg total) by mouth 2 (two) times daily. 05/22/21   Shelly Coss, MD  apixaban (ELIQUIS) 5 MG TABS tablet Take 1 tablet (5 mg total) by mouth 2 (two) times daily. 05/22/21   Shelly Coss, MD  aspirin EC 81 MG tablet Take 1 tablet (81 mg total) by mouth daily. MAY RESUME ON 12/31/20 12/31/20   Bonnielee Haff, MD  carvedilol (COREG) 25 MG  tablet Take 25 mg by mouth 2 (two) times daily.     [provider]  ELIQUIS 5 MG TABS tablet Take 1 tablet (5 mg total) by mouth 2 (two) times daily. MAY RESUME ON 12/31/20 12/31/20   Bonnielee Haff, MD  furosemide (LASIX) 20 MG tablet Take 20 mg by mouth daily.     [provider]  gabapentin (NEURONTIN) 300 MG capsule Take 300 mg by mouth 2 (two) times daily.     [provider]  insulin glargine (LANTUS) 100 UNIT/ML injection Inject 77 Units into the skin at bedtime.    [provider]  insulin lispro (HUMALOG) 100 UNIT/ML injection Inject 22 Units into the skin 3 (three) times daily with meals.    [provider]  ketoconazole (NIZORAL) 2 % cream Apply topically daily. Patient not taking: Reported on 05/16/2021 12/21/20   [provider]  liver oil-zinc oxide (DESITIN) 40 % ointment Apply 1 application topically as needed for irritation.    [provider]  losartan (COZAAR) 25 MG tablet Take 1 tablet (25 mg total) by mouth daily. 05/22/21   Shelly Coss, MD  Multiple Vitamin (MULTIVITAMIN WITH MINERALS) TABS tablet Take 1 tablet by mouth daily.    [provider]  multivitamin (RENA-VIT) TABS tablet Take 1 tablet by mouth daily.    [provider]  omeprazole (PRILOSEC) 40 MG capsule Take 40 mg by mouth daily.     [provider]  PARoxetine (PAXIL) 40 MG tablet Take 40 mg by mouth at bedtime.     [provider]  rosuvastatin (CRESTOR) 5 MG tablet Take 2.5 mg by mouth at bedtime.    [provider]  sitaGLIPtin (JANUVIA) 50 MG tablet Take 50 mg by mouth daily.    [provider]  traZODone (DESYREL) 50 MG tablet Take 50 mg by mouth at bedtime.     [provider]  umeclidinium bromide (INCRUSE ELLIPTA) 62.5 MCG/INH AEPB Inhale 1 puff into the lungs. Patient not taking: Reported on 05/16/2021 05/19/20   [provider]    Allergies Codeine, Dilaudid  [hydromorphone], Demerol [meperidine], Pravastatin, Lipitor [atorvastatin], Nifedipine, Statins, Sulfa antibiotics, and Vancomycin  Family History  Problem Relation Age of Onset   COPD Sister        had lung transplant   COPD Brother     Social History Social History   Tobacco Use   Smoking status: Never   Smokeless tobacco: Never  Substance Use Topics   Alcohol use: No    Alcohol/week: 0.0 standard drinks   Drug use: No    Review of Systems  Review of Systems  Constitutional:  Positive for malaise/fatigue. Negative for chills and fever.  HENT:  Negative for sore throat.   Eyes:  Negative for pain.  Respiratory:  Positive for cough and shortness of breath. Negative for stridor.   Cardiovascular:  Positive for chest pain.  Gastrointestinal:  Negative for vomiting.  Genitourinary:  Negative for dysuria.  Musculoskeletal:  Negative for myalgias.  Skin:  Negative for rash.  Neurological:  Negative for seizures, loss of consciousness and headaches.  Psychiatric/Behavioral:  Negative for suicidal ideas.   All other systems reviewed and are negative.    ____________________________________________   PHYSICAL EXAM:  VITAL SIGNS: ED Triage Vitals  Enc Vitals Group     BP      Pulse      Resp      Temp      Temp src      SpO2      Weight      Height      Head Circumference      Peak Flow      Pain Score      Pain Loc      Pain Edu?      Excl. in [place]?    Vitals:   09/16/21 1430 09/16/21 1500  BP: (!) 176/125 (!) 141/89  Pulse: (!) 130 (!) 135  Resp:    Temp:    SpO2: 94% 93%   Physical Exam Vitals and nursing note reviewed.  Constitutional:      General: She is in acute distress.     Appearance: She is well-developed. She is obese. She is ill-appearing.  HENT:     Head: Normocephalic and atraumatic.     Right Ear: External ear normal.     Left Ear: External ear normal.     Nose: Nose normal.  Eyes:     Conjunctiva/sclera: Conjunctivae normal.   Cardiovascular:     Rate and Rhythm: Tachycardia present.     Heart sounds: No murmur heard. Pulmonary:     Effort: Pulmonary effort is normal. Tachypnea present. No respiratory distress.     Breath sounds: Decreased breath sounds present.  Abdominal:     Palpations: Abdomen is soft.     Tenderness: There is no abdominal tenderness.  Musculoskeletal:        General: No swelling.     Cervical back: Neck supple.     Right lower leg: Edema present.     Left lower leg: Edema present.  Skin:    General: Skin is warm and dry.     Capillary Refill: Capillary refill takes less than 2 seconds.  Neurological:     Mental Status: She is alert.  Psychiatric:        Mood and Affect: Mood normal.     ____________________________________________   LABS (all labs ordered are listed, but only abnormal results are displayed)  Labs Reviewed  CBC WITH DIFFERENTIAL/PLATELET - Abnormal; Notable for the following components:      Result Value   Platelets 129 (*)    All other components within normal limits  BASIC METABOLIC PANEL - Abnormal; Notable for the following components:   Glucose, Bld 184 (*)    BUN 29 (*)    Creatinine, Ser 1.38 (*)    GFR, Estimated 39 (*)    All other components within normal limits  RESP PANEL BY RT-PCR (FLU A&B, COVID) ARPGX2  BRAIN NATRIURETIC PEPTIDE  MAGNESIUM  TROPONIN I (HIGH SENSITIVITY)  TROPONIN I (HIGH SENSITIVITY)   ____________________________________________  EKG  ECG remarkable for a flutter with a 2-1 AV block with a ventricular rate of 133, left axis deviation, otherwise unremarkable also some nonspecific changes in inferior lateral leads.  No other clear acute ischemia. ____________________________________________  RADIOLOGY  ED MD interpretation: Chest x-ray shows cardiomegaly and some mild bilateral pulm edema without clear focal consolidation, effusion, edema, pneumothorax or other clear acute process.  Official radiology  report(s): DG Chest Portable 1 View  Result Date: 09/16/2021 CLINICAL DATA:  SOB EXAM: PORTABLE CHEST 1 VIEW COMPARISON:  September 2022 FINDINGS: The heart is enlarged. Vascular pedicle appears widened. No definite pleural effusion. Mild interstitial prominence. No lobar consolidation. Partially visualized thoracolumbar fusion hardware. IMPRESSION: Cardiomegaly with probable mild pulmonary edema. Electronically Signed   By: Albin Felling M.D.   On: 09/16/2021 14:32    ____________________________________________   PROCEDURES  Procedure(s) performed (including Critical Care):  .Critical Care Performed by: Lucrezia Starch, MD Authorized by: Lucrezia Starch, MD   Critical care provider statement:    Critical care time (minutes):  30   Critical care was necessary to treat or prevent imminent or life-threatening deterioration of the following conditions:  Cardiac failure   Critical care was time spent personally by me on the following activities:  Development of treatment plan with patient or surrogate, discussions with consultants, evaluation of patient's response to treatment, examination of patient, ordering and review of laboratory studies, ordering and review of radiographic studies, ordering and performing treatments and interventions, pulse oximetry, re-evaluation of patient's condition and review of old charts   ____________________________________________   INITIAL IMPRESSION / [place] / ED COURSE      Patient presents with above-stated history exam for assessment of some chest pressure rating around the left arm associate with couple days of worsening shortness of breath and cough.  Patient did get full dose ASA prior to coming to emergency room.  She is reported 91% with EMS and transport on 2 L nasal cannula.  Differential includes ACS, symptomatic tacky dysrhythmia, anemia, colitis, pneumonia and CHF.  I have low suspicion for PE given patient reports compliant  with her Eliquis.  ECG remarkable for a flutter with a 2-1 AV block with a ventricular rate of 133, left axis deviation, otherwise unremarkable also some nonspecific changes in inferior lateral leads.  No other clear acute ischemia.  Initial troponin nonelevated 10.  We will obtain a 2-hour repeat delta troponin.  Chest x-ray shows cardiomegaly and some mild bilateral pulm edema without clear focal consolidation, effusion, edema, pneumothorax or other clear acute process.  BNP is double digits but patient overall appears volume overloaded and on recent echo on 8/31 while she was noted to have good systolic function however not able to evaluate her diastolic dysfunction I suspect component of acute diastolic heart failure.  We will give a dose of Lasix.  We will also start patient on diltiazem drip for a flutter.  CBC without leukocytosis or acute anemia.  BMP shows glucose of 184 without other significant electrolyte or metabolic derangements  I suspect symptoms primarily related to uncontrolled a flutter and some mild CHF.  I will admit to medicine service for further evaluation and management.     ____________________________________________   FINAL CLINICAL IMPRESSION(S) / ED DIAGNOSES  Final diagnoses:  Chest pain, unspecified type  Atrial flutter, unspecified type (HCC)    Medications  nitroGLYCERIN (NITROSTAT) SL tablet 0.4 mg (0.4 mg Sublingual Given 09/16/21 1511)  diltiazem (CARDIZEM) 1 mg/mL load via infusion  10 mg (10 mg Intravenous Bolus from Bag 09/16/21 1518)    And  diltiazem (CARDIZEM) 125 mg in dextrose 5% 125 mL (1 mg/mL) infusion (5 mg/hr Intravenous New Bag/Given 09/16/21 1518)  carvedilol (COREG) tablet 25 mg (25 mg Oral Given 09/16/21 1511)  furosemide (LASIX) injection 80 mg (80 mg Intravenous Given 09/16/21 1423)     ED Discharge Orders     None        Note:  This document was prepared using Dragon voice recognition software and may include  unintentional dictation errors.    Lucrezia Starch, MD 09/16/21 1520

## 2021-09-16 NOTE — Assessment & Plan Note (Signed)
Noncompliant at home. - Attempt CPAP

## 2021-09-16 NOTE — Assessment & Plan Note (Signed)
Presented with pulmonary edema on chest x-ray, shortness of breath in setting of atrial flutter.  Known EF 55 to 60% earlier this year, now decompensated in setting of noncompliance, worsened with A flutter. -Furosemide 40 mg IV twice a day  -K supplement -Strict I/Os, daily weights, telemetry  -Daily monitoring renal function

## 2021-09-16 NOTE — Assessment & Plan Note (Signed)
No active wheezing -Continue LAMA

## 2021-09-16 NOTE — ED Notes (Signed)
Adjusted up in bed. Pt given something to drink and eat. Pt denies any other needs at this time.

## 2021-09-16 NOTE — Assessment & Plan Note (Signed)
-   Diurese - Continue home amiodarone and carvedilol - Continue diltiazem drip overnight

## 2021-09-16 NOTE — H&P (Signed)
History and Physical    Patient: Cheyenne Frank [id number] DOB: 10-May-1941 DOA: 09/16/2021 DOS: the patient was seen and examined on 09/16/2021 PCP: [facility]  Patient coming from: Home  Chief Complaint: Dyspnea, chest pain  HPI:  Cheyenne Frank is a 80 y.o. 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  Patient does not weigh herself daily but started to notice weight gain and swelling over a week ago.  In the last several days, she has noted shortness of breath, and then today she felt chest pressure and chest discomfort so she came to the ER.  In the ER heart rate 130, and atrial flutter.  Blood pressure normal.  Chest x-ray showed pulmonary edema.  She was given Coreg, started on diltiazem drip, and given IV Lasix.       Review of Systems: Review of Systems  Constitutional:  Negative for chills, fever and malaise/fatigue.  Respiratory:  Positive for shortness of breath. Negative for cough, sputum production and wheezing.   Cardiovascular:  Positive for chest pain, palpitations, orthopnea and leg swelling.  Neurological:  Positive for weakness.  All other systems reviewed and are negative.    Past Medical History:  Diagnosis Date   Anemia of chronic disease    Anxiety    Arthritis    Asthma    no problems since 1999 (after moving into a new home)   Chronic airway obstruction ([place])    Complication of anesthesia    " feel like I'm dying after I wake up"   Diabetes mellitus without complication ([place])    Diverticulosis    Dysrhythmia    unknown type    Fibromyalgia    Flat back syndrome    GERD (gastroesophageal reflux disease)    Hx of transfusion 2011   Hypertension    Itching    Macular degeneration    Nocturia    Restless leg syndrome    Sleep  apnea    Dx 1990's unable to wear c-pap -   Stroke ([place]) 2010   verbal aphasia x 30 min - resolved - no problem since then   Past Surgical History:  Procedure Laterality Date   ABDOMINAL HYSTERECTOMY     rt so   BACK SURGERY  2010 / 2011   BREAST [place]  2015   cataracts removed     [place] / [place]     with T10-S1 fusion   TONSILLECTOMY     TOTAL KNEE ARTHROPLASTY Left 04/06/2013   Procedure: LEFT TOTAL KNEE ARTHROPLASTY;  Surgeon: Gearlean Alf, MD;  Location: [facility];  Service: Orthopedics;  Laterality: Left;   Social History:  reports that she has never smoked. She has never used smokeless tobacco. She reports that she does not drink alcohol and does not use drugs.  Allergies  Allergen Reactions   Codeine Palpitations    Other Reaction: RACES HEART   Dilaudid [Hydromorphone] Other (See Comments) and Rash    Other Reaction: lightheaded, altered ms, nervous, Paranoia paranoid Other Reaction: lightheaded, altered ms, nervous, Paranoia   Demerol [Meperidine] Other (See Comments) and Rash  agitated Agitation   Pravastatin Other (See Comments)    Muscle aches   Lipitor [Atorvastatin] Other (See Comments) and Itching    unknown   Nifedipine Other (See Comments)    unknown   Statins Rash    Makes her crazy   Sulfa Antibiotics Rash   Vancomycin Rash    deafness    Family History  Problem Relation Age of Onset   COPD Sister        had lung transplant   COPD Brother     Prior to Admission medications   Medication Sig Start Date End Date Taking? Authorizing Provider  acetaminophen (TYLENOL) 500 MG tablet Take 500-1,000 mg by mouth every 6 (six) hours as needed for mild pain or fever.     [provider]  albuterol (VENTOLIN HFA) 108 (90 Base) MCG/ACT inhaler albuterol sulfate HFA 90 mcg/actuation  aerosol inhaler  INHALE 2 PUFFS INTO LUNGS 4 TIMES DAILY 05/19/20   [provider]  amiodarone (PACERONE) 200 MG tablet Take 1 tablet (200 mg total) by mouth 2 (two) times daily. 05/22/21   Burnadette Pop, MD  apixaban (ELIQUIS) 5 MG TABS tablet Take 1 tablet (5 mg total) by mouth 2 (two) times daily. 05/22/21   Burnadette Pop, MD  aspirin EC 81 MG tablet Take 1 tablet (81 mg total) by mouth daily. MAY RESUME ON 12/31/20 12/31/20   Osvaldo Shipper, MD  carvedilol (COREG) 25 MG tablet Take 25 mg by mouth 2 (two) times daily.     [provider]  ELIQUIS 5 MG TABS tablet Take 1 tablet (5 mg total) by mouth 2 (two) times daily. MAY RESUME ON 12/31/20 12/31/20   Osvaldo Shipper, MD  furosemide (LASIX) 20 MG tablet Take 20 mg by mouth daily.     [provider]  gabapentin (NEURONTIN) 300 MG capsule Take 300 mg by mouth 2 (two) times daily.     [provider]  insulin glargine (LANTUS) 100 UNIT/ML injection Inject 77 Units into the skin at bedtime.    [provider]  insulin lispro (HUMALOG) 100 UNIT/ML injection Inject 22 Units into the skin 3 (three) times daily with meals.    [provider]  ketoconazole (NIZORAL) 2 % cream Apply topically daily. Patient not taking: Reported on 05/16/2021 12/21/20   [provider]  liver oil-zinc oxide (DESITIN) 40 % ointment Apply 1 application topically as needed for irritation.    [provider]  losartan (COZAAR) 25 MG tablet Take 1 tablet (25 mg total) by mouth daily. 05/22/21   Burnadette Pop, MD  Multiple Vitamin (MULTIVITAMIN WITH MINERALS) TABS tablet Take 1 tablet by mouth daily.    [provider]  multivitamin (RENA-VIT) TABS tablet Take 1 tablet by mouth daily.    [provider]  omeprazole (PRILOSEC) 40 MG capsule Take 40 mg by mouth daily.     [provider]  PARoxetine (PAXIL) 40 MG tablet Take 40 mg by mouth at bedtime.     [provider]   rosuvastatin (CRESTOR) 5 MG tablet Take 2.5 mg by mouth at bedtime.    [provider]  sitaGLIPtin (JANUVIA) 50 MG tablet Take 50 mg by mouth daily.    [provider]  traZODone (DESYREL) 50 MG tablet Take 50 mg by mouth at bedtime.     [provider]  umeclidinium bromide (INCRUSE ELLIPTA) 62.5 MCG/INH AEPB Inhale 1 puff into the lungs. Patient not taking: Reported on 05/16/2021 05/19/20  [provider]    Physical Exam: Vitals:   09/16/21 1610 09/16/21 1630 09/16/21 1700 09/16/21 1730  BP:  111/88 125/69 106/73  Pulse: (!) 130 (!) 107 100 (!) 103  Resp:      Temp:      TempSrc:      SpO2: 94% 91% 91% 93%  Weight:      Height:       General appearance: Female, lying in bed, appears uncomfortable.     HEENT: Anicteric, conjunctival pink, lids and lashes normal.  No nasal deformity, discharge, or epistaxis.  Oropharynx moist, no oral lesions. Skin: No suspicious rashes or lesions Cardiac: Tachycardic, regular, no murmurs, JVP not visible due to body habitus, pitting lower extremity edema to the knee.  Brawny venous stasis changes both lower extremities. Respiratory: Increased respiratory rate, no wheezing or rales. Abdomen: Abdomen soft, large old abdominal scar.  No ascites.  No tenderness palpation or guarding. MSK:  Neuro: Awake and alert, extraocular movements intact, moves all extremities with generalized weakness but symmetric strength, speech fluent. Psych: Attention normal, affect normal, judgment insight appear normal.   Data Reviewed: Metabolic panel notable for chronic kidney disease, hyperglycemia.  Magnesium normal.  Complete blood count unremarkable.  COVID-negative.  BNP normal.  Troponins negative x2.  Chest x-ray showed cardiomegaly and edema.  ECG showed atrial flutter.        Assessment/Plan * Acute on chronic diastolic CHF (congestive heart failure) ([place])- (present on admission) Presented with pulmonary edema on chest  x-ray, shortness of breath in setting of atrial flutter.  Known EF 55 to 60% earlier this year, now decompensated in setting of noncompliance, worsened with A flutter. -Furosemide 40 mg IV twice a day  -K supplement -Strict I/Os, daily weights, telemetry  -Daily monitoring renal function     Atrial flutter with rapid ventricular response ([place])- (present on admission) - Diurese - Continue home amiodarone and carvedilol - Continue diltiazem drip overnight  Type 2 diabetes mellitus with diabetic polyneuropathy, with long-term current use of insulin (HCC) A1c 8.2% in Sep - Continue Lantus (may need to adjust up) - Continue Mealtime insulin - High dose SS correction insulin -Continue gabapentin -Continue Crestor  Cerebrovascular disease- (present on admission) -Continue Crestor -Continue Eliquis  Essential hypertension- (present on admission) - Hold losartan for now - Continue Coreg  Sleep apnea- (present on admission) Noncompliant at home. - Attempt CPAP  Stage 3b chronic kidney disease (CKD) ([place])- (present on admission) Cr at baseline 1.2-1.6  Depression- (present on admission) -Continue Paxil  COPD (chronic obstructive pulmonary disease) ([place])- (present on admission) No active wheezing -Continue LAMA  Obesity, Class III, BMI 40-49.9 (morbid obesity) ([place])- (present on admission) BMI 49    Advance Care Planning:  FULL   Consults: None  Family Communication: Daughter at bedside  Severity of Illness: The appropriate patient status for this patient is INPATIENT. Inpatient status is judged to be reasonable and necessary in order to provide the required intensity of service to ensure the patient's safety. The patient's presenting symptoms, physical exam findings, and initial radiographic and laboratory data in the context of their chronic comorbidities is felt to place them at high risk for further clinical deterioration. Furthermore, it is not anticipated that the  patient will be medically stable for discharge from the hospital within 2 midnights of admission.   * I certify that at the point of admission it is my clinical judgment that the patient will require inpatient hospital care spanning beyond 2 midnights  from the point of admission due to high intensity of service, high risk for further deterioration and high frequency of surveillance required.*  Author: Edwin Dada 09/16/2021 6:07 PM  For on call review [email or web address].

## 2021-09-17 DIAGNOSIS — N183 Chronic kidney disease, stage 3 unspecified: Secondary | ICD-10-CM | POA: Diagnosis not present

## 2021-09-17 DIAGNOSIS — Z79899 Other long term (current) drug therapy: Secondary | ICD-10-CM | POA: Diagnosis not present

## 2021-09-17 DIAGNOSIS — I4891 Unspecified atrial fibrillation: Secondary | ICD-10-CM | POA: Diagnosis not present

## 2021-09-17 DIAGNOSIS — Z20822 Contact with and (suspected) exposure to covid-19: Secondary | ICD-10-CM | POA: Diagnosis not present

## 2021-09-17 DIAGNOSIS — I5033 Acute on chronic diastolic (congestive) heart failure: Secondary | ICD-10-CM | POA: Diagnosis not present

## 2021-09-17 DIAGNOSIS — Z794 Long term (current) use of insulin: Secondary | ICD-10-CM | POA: Diagnosis not present

## 2021-09-17 DIAGNOSIS — R0602 Shortness of breath: Secondary | ICD-10-CM | POA: Diagnosis present

## 2021-09-17 DIAGNOSIS — Z7982 Long term (current) use of aspirin: Secondary | ICD-10-CM | POA: Diagnosis not present

## 2021-09-17 DIAGNOSIS — E1142 Type 2 diabetes mellitus with diabetic polyneuropathy: Secondary | ICD-10-CM | POA: Diagnosis not present

## 2021-09-17 DIAGNOSIS — J449 Chronic obstructive pulmonary disease, unspecified: Secondary | ICD-10-CM | POA: Diagnosis not present

## 2021-09-17 DIAGNOSIS — I130 Hypertensive heart and chronic kidney disease with heart failure and stage 1 through stage 4 chronic kidney disease, or unspecified chronic kidney disease: Secondary | ICD-10-CM | POA: Diagnosis not present

## 2021-09-17 LAB — CBG MONITORING, ED: Glucose-Capillary: 123 mg/dL — ABNORMAL HIGH (ref 70–99)

## 2021-09-17 LAB — BASIC METABOLIC PANEL
Anion gap: 8 (ref 5–15)
BUN: 27 mg/dL — ABNORMAL HIGH (ref 8–23)
CO2: 33 mmol/L — ABNORMAL HIGH (ref 22–32)
Calcium: 8.9 mg/dL (ref 8.9–10.3)
Chloride: 98 mmol/L (ref 98–111)
Creatinine, Ser: 1.41 mg/dL — ABNORMAL HIGH (ref 0.44–1.00)
GFR, Estimated: 38 mL/min — ABNORMAL LOW (ref >60)
Glucose, Bld: 117 mg/dL — ABNORMAL HIGH (ref 70–99)
Potassium: 3.6 mmol/L (ref 3.5–5.1)
Sodium: 139 mmol/L (ref 135–145)

## 2021-09-17 MED ORDER — AMIODARONE HCL 100 MG PO TABS: 100.0000 mg | ORAL_TABLET | Freq: Every day | ORAL | 3 refills | Status: DC

## 2021-09-17 MED ORDER — AMIODARONE HCL 100 MG PO TABS: 100.0000 mg | ORAL_TABLET | Freq: Two times a day (BID) | ORAL | 3 refills | Status: DC

## 2021-09-17 NOTE — ED Notes (Addendum)
Pts heart rate appears to be maintained around 60 HR. Cardizem drip turned off at this time. Provider will be notified.

## 2021-09-17 NOTE — Discharge Summary (Signed)
Physician Discharge Summary   Patient: Cheyenne Frank MRN: [id number] DOB: @DOB   Admit date:     09/16/2021  Discharge date: 09/17/21  Discharge Physician: Edwin Dada   PCP: [facility]   Recommendations at discharge: 1. Follow up with PCP in 1 week 2. Please obtain CBC and BMP in 1 week 3. Follow up with Cardiology, Dr. Saralyn Pilar in 2-4 weeks          Discharge Diagnoses Principal Problem:   Acute on chronic diastolic CHF (congestive heart failure) ([place]) Active Problems:   Atrial flutter with rapid ventricular response (HCC)   Type 2 diabetes mellitus with diabetic polyneuropathy, with long-term current use of insulin (HCC)   Essential hypertension   Cerebrovascular disease   Sleep apnea   Stage 3b chronic kidney disease (CKD) (HCC)   COPD (chronic obstructive pulmonary disease) (HCC)   Depression   Obesity, Class III, BMI 40-49.9 (morbid obesity) [facility])     Hospital Course   Cheyenne Frank is a 81 y.o. 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  Patient does not weigh herself daily but started to notice weight gain and swelling over a week ago.  In the last several days, she has noted shortness of breath, and then today she felt chest pressure and chest discomfort so she came to the ER.  In the ER heart rate 130, and atrial flutter.  Blood pressure normal.  Chest x-ray showed pulmonary edema.  She was given Coreg, started on diltiazem drip, and given IV Lasix.       * Acute on chronic diastolic CHF (congestive heart failure) ([place])- (present on admission) Presented with pulmonary edema on chest x-ray, leg swelling, weight gain, and shortness of breath in setting of atrial flutter.  Known EF 55 to 60% earlier this year, now decompensated in  setting of noncompliance, worsened with A flutter.  Started on IV Lasix.  Swelling resolved.  Felt at baseline.  Has O2 at home       Atrial flutter with rapid ventricular response ([place])- (present on admission) She was started on diltiazem drip, diuresed and given her home Coreg, amiodarone.  Overnight, HR reverted to sinus rhythm, diltiazem was turned off.  In morning, her symptoms were resolved.   Chronic respiratory failure with hypoxia Due to CHF, obesity, COPD.  Type 2 diabetes mellitus with diabetic polyneuropathy, with long-term current use of insulin (HCC) A1c 8.2% in Sep  Cerebrovascular disease- (present on admission)  Essential hypertension- (present on admission)  Sleep apnea- (present on admission) Noncompliant at home.  Stage 3b chronic kidney disease (CKD) ([place])- (present on admission) Cr at baseline 1.2-1.6  Depression- (present on admission)  COPD (chronic obstructive pulmonary disease) ([place])- (present on admission) No active wheezing  Obesity, Class III, BMI 40-49.9 (morbid obesity) ([place])- (present on admission) BMI 49           Disposition: Home Diet recommendation: Cardiac diet  DISCHARGE MEDICATION: Allergies as of 09/17/2021       Reactions   Codeine Palpitations   Other Reaction: RACES HEART   Dilaudid [hydromorphone] Other (See Comments), Rash   Other Reaction: lightheaded, altered ms, nervous, Paranoia paranoid Other Reaction: lightheaded, altered ms, nervous, Paranoia   Demerol [meperidine] Other (See Comments), Rash   agitated Agitation   Pravastatin Other (  See Comments)   Muscle aches   Lipitor [atorvastatin] Other (See Comments), Itching   unknown   Nifedipine Other (See Comments)   unknown   Statins Rash   Makes her crazy   Sulfa Antibiotics Rash   Vancomycin Rash   deafness        Medication List     TAKE these medications    acetaminophen 500 MG tablet Commonly known as: TYLENOL Take 500-1,000 mg by  mouth every 6 (six) hours as needed for mild pain or fever.   albuterol 108 (90 Base) MCG/ACT inhaler Commonly known as: VENTOLIN HFA albuterol sulfate HFA 90 mcg/actuation aerosol inhaler  INHALE 2 PUFFS INTO LUNGS 4 TIMES DAILY   amiodarone 100 MG tablet Commonly known as: PACERONE Take 1 tablet (100 mg total) by mouth daily. What changed:  medication strength how much to take when to take this   apixaban 5 MG Tabs tablet Commonly known as: Eliquis Take 1 tablet (5 mg total) by mouth 2 (two) times daily. What changed: Another medication with the same name was removed. Continue taking this medication, and follow the directions you see here.   aspirin EC 81 MG tablet Take 1 tablet (81 mg total) by mouth daily. MAY RESUME ON 12/31/20   carvedilol 25 MG tablet Commonly known as: COREG Take 25 mg by mouth 2 (two) times daily.   furosemide 20 MG tablet Commonly known as: LASIX Take 20 mg by mouth daily.   gabapentin 300 MG capsule Commonly known as: NEURONTIN Take 300 mg by mouth 2 (two) times daily.   Incruse Ellipta 62.5 MCG/ACT Aepb Generic drug: umeclidinium bromide Inhale 1 puff into the lungs.   insulin glargine 100 UNIT/ML injection Commonly known as: LANTUS Inject 77 Units into the skin at bedtime.   insulin lispro 100 UNIT/ML injection Commonly known as: HUMALOG Inject 22 Units into the skin 3 (three) times daily with meals.   ketoconazole 2 % cream Commonly known as: NIZORAL Apply topically daily.   liver oil-zinc oxide 40 % ointment Commonly known as: DESITIN Apply 1 application topically as needed for irritation.   losartan 25 MG tablet Commonly known as: COZAAR Take 1 tablet (25 mg total) by mouth daily.   multivitamin Tabs tablet Take 1 tablet by mouth daily.   multivitamin with minerals Tabs tablet Take 1 tablet by mouth daily.   omeprazole 40 MG capsule Commonly known as: PRILOSEC Take 40 mg by mouth daily.   PARoxetine 40 MG  tablet Commonly known as: PAXIL Take 40 mg by mouth at bedtime.   rosuvastatin 5 MG tablet Commonly known as: CRESTOR Take 2.5 mg by mouth at bedtime.   sitaGLIPtin 50 MG tablet Commonly known as: JANUVIA Take 50 mg by mouth daily.   traZODone 50 MG tablet Commonly known as: DESYREL Take 50 mg by mouth at bedtime.        Follow-up [place]. Schedule an appointment as soon as possible for a visit in 1 week(s).   Specialty: Family Medicine Contact information: [place] [phone]         Isaias Cowman, MD. Schedule an appointment as soon as possible for a visit in 2 week(s).   Specialty: Cardiology Contact information: [facility] [place] [address] [phone]                 Discharge Exam: Danley Danker Weights   09/16/21  1334  Weight: 122.5 kg   General: Pt is alert, awake, not in acute distress Cardiovascular: RRR, nl S1-S2, no murmurs appreciated.   No LE edema.   Respiratory: Normal respiratory rate and rhythm.  CTAB without rales. Few wheezes noted. Abdominal: Abdomen soft and non-tender.  No distension or HSM.   Neuro/Psych: Strength symmetric in upper and lower extremities.  Judgment and insight appear normal.   Condition at discharge: good  The results of significant diagnostics from this hospitalization (including imaging, microbiology, ancillary and laboratory) are listed below for reference.   Imaging Studies: DG Chest Portable 1 View  Result Date: 09/16/2021 CLINICAL DATA:  SOB EXAM: PORTABLE CHEST 1 VIEW COMPARISON:  September 2022 FINDINGS: The heart is enlarged. Vascular pedicle appears widened. No definite pleural effusion. Mild interstitial prominence. No lobar consolidation. Partially visualized thoracolumbar fusion hardware. IMPRESSION: Cardiomegaly with probable mild pulmonary edema. Electronically  Signed   By: Albin Felling M.D.   On: 09/16/2021 14:32    Microbiology: Results for orders placed or performed during the hospital encounter of 09/16/21  Resp Panel by RT-PCR (Flu A&B, Covid) Nasopharyngeal Swab     Status: None   Collection Time: 09/16/21  1:39 PM   Specimen: Nasopharyngeal Swab; Nasopharyngeal(NP) swabs in vial transport medium  Result Value Ref Range Status   SARS Coronavirus 2 by RT PCR NEGATIVE NEGATIVE Final    Comment: (NOTE) SARS-CoV-2 target nucleic acids are NOT DETECTED.  The SARS-CoV-2 RNA is generally detectable in upper respiratory specimens during the acute phase of infection. The lowest concentration of SARS-CoV-2 viral copies this assay can detect is 138 copies/mL. A negative result does not preclude SARS-Cov-2 infection and should not be used as the sole basis for treatment or other patient management decisions. A negative result may occur with  improper specimen collection/handling, submission of specimen other than nasopharyngeal swab, presence of viral mutation(s) within the areas targeted by this assay, and inadequate number of viral copies(<138 copies/mL). A negative result must be combined with clinical observations, patient history, and epidemiological information. The expected result is Negative.  Fact Sheet for Patients:  EntrepreneurPulse.com.au  Fact Sheet for Healthcare Providers:  IncredibleEmployment.be  This test is no t yet approved or cleared by the [place] FDA and  has been authorized for detection and/or diagnosis of SARS-CoV-2 by FDA under an Emergency Use Authorization (EUA). This EUA will remain  in effect (meaning this test can be used) for the duration of the COVID-19 declaration under Section 564(b)(1) of the Act, 21 U.S.C.section 360bbb-3(b)(1), unless the authorization is terminated  or revoked sooner.       Influenza A by PCR NEGATIVE NEGATIVE Final   Influenza B by PCR  NEGATIVE NEGATIVE Final    Comment: (NOTE) The Xpert Xpress SARS-CoV-2/FLU/RSV plus assay is intended as an aid in the diagnosis of influenza from Nasopharyngeal swab specimens and should not be used as a sole basis for treatment. Nasal washings and aspirates are unacceptable for Xpert Xpress SARS-CoV-2/FLU/RSV testing.  Fact Sheet for Patients: EntrepreneurPulse.com.au  Fact Sheet for Healthcare Providers: IncredibleEmployment.be  This test is not yet approved or cleared by the [place] FDA and has been authorized for detection and/or diagnosis of SARS-CoV-2 by FDA under an Emergency Use Authorization (EUA). This EUA will remain in effect (meaning this test can be used) for the duration of the COVID-19 declaration under Section 564(b)(1) of the Act, 21 U.S.C. section 360bbb-3(b)(1), unless the authorization is terminated or revoked.  Performed at [facility],  [address] [place], [place] [address]     Labs: CBC: Recent Labs  Lab 09/16/21 1339  WBC 7.1  NEUTROABS 5.0  HGB 13.6  HCT 42.9  MCV 97.7  PLT [id number]*   Basic Metabolic Panel: Recent Labs  Lab 09/16/21 1339 09/17/21 0749  NA 140 139  K 4.2 3.6  CL 102 98  CO2 31 33*  GLUCOSE 184* 117*  BUN 29* 27*  CREATININE 1.38* 1.41*  CALCIUM 9.3 8.9  MG 2.1  --    Liver Function Tests: No results for input(s): AST, ALT, ALKPHOS, BILITOT, PROT, ALBUMIN in the last 168 hours. CBG: Recent Labs  Lab 09/16/21 2130 09/17/21 0841  GLUCAP 114* 123*    Discharge time spent: greater than 30 minutes.  Signed:  Edwin Dada MD.  [facility] 09/17/2021

## 2021-09-17 NOTE — ED Notes (Signed)
Pt resting comfortably in bed at this time. Pt is alert and oriented with even and regular respirations. No acute distress noted. Pt denies any needs at this time. Call light within reach.  °

## 2021-09-17 NOTE — Care Management Obs Status (Signed)
[place] NOTIFICATION   Patient Details  Name: Cheyenne Frank MRN: [id number] Date of Birth: [birth year]   Medicare Observation Status Notification Given:  Yes    Raina Mina, [place] 09/17/2021, 1:21 PM

## 2021-09-17 NOTE — ED Notes (Signed)
Patient brief placed and purwick posittioned in place. Pulled up in bed. Patient set up with breakfast tray eating

## 2021-09-17 NOTE — Care Management CC44 (Signed)
Condition Code 44 Documentation Completed  Patient Details  Name: SOLOMIYA GAIER MRN: [id number] Date of Birth: 07/27/1941   Condition Code 44 given:  Yes Patient signature on Condition Code 44 notice:  Yes Documentation of 2 MD's agreement:  Yes Code 44 added to claim:  Yes    Raina Mina, [place] 09/17/2021, 1:22 PM

## 2021-09-17 NOTE — ED Notes (Signed)
Pts pure wick leaked in bed. Pt linens are changed and adjusted in bed.

## 2021-09-17 NOTE — ED Notes (Addendum)
Inpatient provider notified of pts heart rate now in the high 50s-60s and notified that drip was stopped at this time.

## 2021-09-29 ENCOUNTER — Ambulatory Visit: Payer: Commercial Managed Care - PPO | Admitting: Family

## 2021-10-09 NOTE — Progress Notes (Deleted)
°   Patient ID: Cheyenne Frank, female    DOB: 08-Jan-1941, 81 y.o.   MRN: [id number]  HPI  Cheyenne Frank is a 81 y/o female with a history of  Echo report from 05/17/21 reviewed and showed an EF of 55-60% along with mild LVH.  Was in the ED 09/16/21 due to chest pain and shortness of breath. EKG showed a flutter with 2:1 AV block. Placed on diltiazem drip. Initially given IV lasix with transition to oral diuretics. Discharged the next day.   She presents today for her initial visit with a chief complaint of   Review of Systems    Physical Exam   Assessment & Plan:  1: Chronic heart failure with preserved ejection fraction with structural changes (LVH)- - NYHA class - saw cardiology ([place]) 06/22/21 - BNP 09/16/21 was 64.9  2: HTN with CKD- - BP - had telemedicine visit with PCP 07/18/21 - BMP 09/17/21 reviewed and showed sodium 139, potassium 3.6, creatinine 1.41 and GFR 38  3: DM- - A1c 05/18/21 was 8.2%  4: COPD-

## 2021-10-11 ENCOUNTER — Ambulatory Visit: Payer: Commercial Managed Care - PPO | Admitting: Family

## 2021-10-26 ENCOUNTER — Encounter: Payer: Self-pay | Admitting: Internal Medicine

## 2021-10-26 ENCOUNTER — Inpatient Hospital Stay
Admission: EM | Admit: 2021-10-26 | Discharge: 2021-10-30 | DRG: 291 | Disposition: A | Discharge status: Home Health | Payer: Medicare Other | Attending: Internal Medicine | Admitting: Internal Medicine

## 2021-10-26 ENCOUNTER — Other Ambulatory Visit: Payer: Self-pay

## 2021-10-26 ENCOUNTER — Emergency Department: Payer: Medicare Other

## 2021-10-26 DIAGNOSIS — Z20822 Contact with and (suspected) exposure to covid-19: Secondary | ICD-10-CM | POA: Diagnosis present

## 2021-10-26 DIAGNOSIS — J9601 Acute respiratory failure with hypoxia: Secondary | ICD-10-CM

## 2021-10-26 DIAGNOSIS — Z7901 Long term (current) use of anticoagulants: Secondary | ICD-10-CM

## 2021-10-26 DIAGNOSIS — I4891 Unspecified atrial fibrillation: Secondary | ICD-10-CM

## 2021-10-26 DIAGNOSIS — Z9049 Acquired absence of other specified parts of digestive tract: Secondary | ICD-10-CM

## 2021-10-26 DIAGNOSIS — I10 Essential (primary) hypertension: Secondary | ICD-10-CM | POA: Diagnosis present

## 2021-10-26 DIAGNOSIS — R0602 Shortness of breath: Secondary | ICD-10-CM | POA: Diagnosis present

## 2021-10-26 DIAGNOSIS — R490 Dysphonia: Secondary | ICD-10-CM

## 2021-10-26 DIAGNOSIS — J449 Chronic obstructive pulmonary disease, unspecified: Secondary | ICD-10-CM | POA: Diagnosis present

## 2021-10-26 DIAGNOSIS — Z91199 Patient's noncompliance with other medical treatment and regimen due to unspecified reason: Secondary | ICD-10-CM

## 2021-10-26 DIAGNOSIS — R1310 Dysphagia, unspecified: Secondary | ICD-10-CM | POA: Diagnosis present

## 2021-10-26 DIAGNOSIS — G2581 Restless legs syndrome: Secondary | ICD-10-CM | POA: Diagnosis present

## 2021-10-26 DIAGNOSIS — E1165 Type 2 diabetes mellitus with hyperglycemia: Secondary | ICD-10-CM | POA: Diagnosis present

## 2021-10-26 DIAGNOSIS — J441 Chronic obstructive pulmonary disease with (acute) exacerbation: Secondary | ICD-10-CM | POA: Diagnosis present

## 2021-10-26 DIAGNOSIS — I130 Hypertensive heart and chronic kidney disease with heart failure and stage 1 through stage 4 chronic kidney disease, or unspecified chronic kidney disease: Principal | ICD-10-CM | POA: Diagnosis present

## 2021-10-26 DIAGNOSIS — I480 Paroxysmal atrial fibrillation: Secondary | ICD-10-CM | POA: Diagnosis present

## 2021-10-26 DIAGNOSIS — Z96652 Presence of left artificial knee joint: Secondary | ICD-10-CM | POA: Diagnosis present

## 2021-10-26 DIAGNOSIS — N1832 Chronic kidney disease, stage 3b: Secondary | ICD-10-CM | POA: Diagnosis present

## 2021-10-26 DIAGNOSIS — D631 Anemia in chronic kidney disease: Secondary | ICD-10-CM | POA: Diagnosis present

## 2021-10-26 DIAGNOSIS — N179 Acute kidney failure, unspecified: Secondary | ICD-10-CM | POA: Diagnosis present

## 2021-10-26 DIAGNOSIS — E662 Morbid (severe) obesity with alveolar hypoventilation: Secondary | ICD-10-CM | POA: Diagnosis present

## 2021-10-26 DIAGNOSIS — R079 Chest pain, unspecified: Secondary | ICD-10-CM

## 2021-10-26 DIAGNOSIS — E1122 Type 2 diabetes mellitus with diabetic chronic kidney disease: Secondary | ICD-10-CM | POA: Diagnosis present

## 2021-10-26 DIAGNOSIS — Z79899 Other long term (current) drug therapy: Secondary | ICD-10-CM

## 2021-10-26 DIAGNOSIS — Z825 Family history of asthma and other chronic lower respiratory diseases: Secondary | ICD-10-CM

## 2021-10-26 DIAGNOSIS — Z90710 Acquired absence of both cervix and uterus: Secondary | ICD-10-CM

## 2021-10-26 DIAGNOSIS — Z8673 Personal history of transient ischemic attack (TIA), and cerebral infarction without residual deficits: Secondary | ICD-10-CM

## 2021-10-26 DIAGNOSIS — Z794 Long term (current) use of insulin: Secondary | ICD-10-CM

## 2021-10-26 DIAGNOSIS — I509 Heart failure, unspecified: Secondary | ICD-10-CM

## 2021-10-26 DIAGNOSIS — Z6841 Body Mass Index (BMI) 40.0 and over, adult: Secondary | ICD-10-CM | POA: Diagnosis not present

## 2021-10-26 DIAGNOSIS — E1142 Type 2 diabetes mellitus with diabetic polyneuropathy: Secondary | ICD-10-CM | POA: Diagnosis present

## 2021-10-26 DIAGNOSIS — M797 Fibromyalgia: Secondary | ICD-10-CM | POA: Diagnosis present

## 2021-10-26 DIAGNOSIS — I5033 Acute on chronic diastolic (congestive) heart failure: Secondary | ICD-10-CM

## 2021-10-26 DIAGNOSIS — G4730 Sleep apnea, unspecified: Secondary | ICD-10-CM | POA: Diagnosis present

## 2021-10-26 DIAGNOSIS — I5032 Chronic diastolic (congestive) heart failure: Secondary | ICD-10-CM | POA: Diagnosis present

## 2021-10-26 DIAGNOSIS — R0902 Hypoxemia: Secondary | ICD-10-CM

## 2021-10-26 HISTORY — DX: Acute kidney failure, unspecified: N17.9

## 2021-10-26 LAB — CBC WITH DIFFERENTIAL/PLATELET
Abs Immature Granulocytes: 0.04 10*3/uL (ref 0.00–0.07)
Basophils Absolute: 0 10*3/uL (ref 0.0–0.1)
Basophils Relative: 1 %
Eosinophils Absolute: 0.2 10*3/uL (ref 0.0–0.5)
Eosinophils Relative: 4 %
HCT: 39.1 % (ref 36.0–46.0)
Hemoglobin: 12.5 g/dL (ref 12.0–15.0)
Immature Granulocytes: 1 %
Lymphocytes Relative: 17 %
Lymphs Abs: 1 10*3/uL (ref 0.7–4.0)
MCH: 30.9 pg (ref 26.0–34.0)
MCHC: 32 g/dL (ref 30.0–36.0)
MCV: 96.5 fL (ref 80.0–100.0)
Monocytes Absolute: 0.6 10*3/uL (ref 0.1–1.0)
Monocytes Relative: 10 %
Neutro Abs: 4.1 10*3/uL (ref 1.7–7.7)
Neutrophils Relative %: 67 %
Platelets: 127 10*3/uL — ABNORMAL LOW (ref 150–400)
RBC: 4.05 MIL/uL (ref 3.87–5.11)
RDW: 14.6 % (ref 11.5–15.5)
WBC: 5.9 10*3/uL (ref 4.0–10.5)
nRBC: 0 % (ref 0.0–0.2)

## 2021-10-26 LAB — COMPREHENSIVE METABOLIC PANEL
ALT: 17 U/L (ref 0–44)
AST: 22 U/L (ref 15–41)
Albumin: 3 g/dL — ABNORMAL LOW (ref 3.5–5.0)
Alkaline Phosphatase: 84 U/L (ref 38–126)
Anion gap: 7 (ref 5–15)
BUN: 31 mg/dL — ABNORMAL HIGH (ref 8–23)
CO2: 32 mmol/L (ref 22–32)
Calcium: 8.6 mg/dL — ABNORMAL LOW (ref 8.9–10.3)
Chloride: 96 mmol/L — ABNORMAL LOW (ref 98–111)
Creatinine, Ser: 1.62 mg/dL — ABNORMAL HIGH (ref 0.44–1.00)
GFR, Estimated: 32 mL/min — ABNORMAL LOW (ref >60)
Glucose, Bld: 318 mg/dL — ABNORMAL HIGH (ref 70–99)
Potassium: 4 mmol/L (ref 3.5–5.1)
Sodium: 135 mmol/L (ref 135–145)
Total Bilirubin: 0.5 mg/dL (ref 0.3–1.2)
Total Protein: 6.4 g/dL — ABNORMAL LOW (ref 6.5–8.1)

## 2021-10-26 LAB — RESP PANEL BY RT-PCR (FLU A&B, COVID) ARPGX2
Influenza A by PCR: NEGATIVE
Influenza B by PCR: NEGATIVE
SARS Coronavirus 2 by RT PCR: NEGATIVE

## 2021-10-26 LAB — GLUCOSE, CAPILLARY
Glucose-Capillary: 440 mg/dL — ABNORMAL HIGH (ref 70–99)
Glucose-Capillary: 349 mg/dL — ABNORMAL HIGH (ref 70–99)
Glucose-Capillary: 163 mg/dL — ABNORMAL HIGH (ref 70–99)

## 2021-10-26 LAB — URINALYSIS, ROUTINE W REFLEX MICROSCOPIC
Bacteria, UA: NONE SEEN
Bilirubin Urine: NEGATIVE
Glucose, UA: 150 mg/dL — AB
Ketones, ur: NEGATIVE mg/dL
Leukocytes,Ua: NEGATIVE
Nitrite: NEGATIVE
Protein, ur: >=300 mg/dL — AB
Specific Gravity, Urine: 1.01 (ref 1.005–1.030)
pH: 6 (ref 5.0–8.0)

## 2021-10-26 LAB — TROPONIN I (HIGH SENSITIVITY)
Troponin I (High Sensitivity): 12 ng/L (ref <18)
Troponin I (High Sensitivity): 14 ng/L (ref <18)

## 2021-10-26 LAB — HEMOGLOBIN A1C
Hgb A1c MFr Bld: 8.5 % — ABNORMAL HIGH (ref 4.8–5.6)
Mean Plasma Glucose: 197.25 mg/dL

## 2021-10-26 LAB — T4, FREE: Free T4: 0.81 ng/dL (ref 0.61–1.12)

## 2021-10-26 LAB — AMYLASE: Amylase: 48 U/L (ref 28–100)

## 2021-10-26 LAB — LIPASE, BLOOD: Lipase: 28 U/L (ref 11–51)

## 2021-10-26 LAB — TSH: TSH: 4.148 u[IU]/mL (ref 0.350–4.500)

## 2021-10-26 MED ORDER — SODIUM CHLORIDE 0.9% FLUSH
3.0000 mL | Freq: Two times a day (BID) | INTRAVENOUS | Status: DC
  Administered 2021-10-26 – 2021-10-30 (×9): 3 mL via INTRAVENOUS

## 2021-10-26 MED ORDER — ACETAMINOPHEN 325 MG PO TABS
650.0000 mg | ORAL_TABLET | ORAL | Status: DC | PRN
  Administered 2021-10-27: 650 mg via ORAL
  Filled 2021-10-26 (×2): qty 2

## 2021-10-26 MED ORDER — ALBUTEROL SULFATE (2.5 MG/3ML) 0.083% IN NEBU
3.0000 mL | INHALATION_SOLUTION | Freq: Four times a day (QID) | RESPIRATORY_TRACT | Status: DC | PRN
  Administered 2021-10-27: 3 mL via RESPIRATORY_TRACT
  Filled 2021-10-26: qty 3

## 2021-10-26 MED ORDER — SODIUM CHLORIDE 0.9% FLUSH: 3.0000 mL | INTRAVENOUS | Status: DC | PRN

## 2021-10-26 MED ORDER — INSULIN GLARGINE-YFGN 100 UNIT/ML ~~LOC~~ SOLN
50.0000 [IU] | Freq: Every day | SUBCUTANEOUS | Status: DC
  Administered 2021-10-26: 50 [IU] via SUBCUTANEOUS
  Filled 2021-10-26 (×3): qty 0.5

## 2021-10-26 MED ORDER — ONDANSETRON HCL 4 MG/2ML IJ SOLN: 4.0000 mg | Freq: Four times a day (QID) | INTRAMUSCULAR | Status: DC | PRN

## 2021-10-26 MED ORDER — PANTOPRAZOLE SODIUM 40 MG PO TBEC
40.0000 mg | DELAYED_RELEASE_TABLET | Freq: Every day | ORAL | Status: DC
  Administered 2021-10-26 – 2021-10-30 (×5): 40 mg via ORAL
  Filled 2021-10-26 (×5): qty 1

## 2021-10-26 MED ORDER — UMECLIDINIUM BROMIDE 62.5 MCG/ACT IN AEPB
1.0000 | INHALATION_SPRAY | Freq: Every day | RESPIRATORY_TRACT | Status: DC
  Administered 2021-10-26 – 2021-10-30 (×5): 1 via RESPIRATORY_TRACT
  Filled 2021-10-26: qty 7

## 2021-10-26 MED ORDER — ROSUVASTATIN CALCIUM 5 MG PO TABS
2.5000 mg | ORAL_TABLET | Freq: Every day | ORAL | Status: DC
  Administered 2021-10-26 – 2021-10-29 (×4): 2.5 mg via ORAL
  Filled 2021-10-26 (×4): qty 1

## 2021-10-26 MED ORDER — ADULT MULTIVITAMIN W/MINERALS CH
1.0000 | ORAL_TABLET | Freq: Every day | ORAL | Status: DC
  Administered 2021-10-26 – 2021-10-30 (×5): 1 via ORAL
  Filled 2021-10-26 (×5): qty 1

## 2021-10-26 MED ORDER — INSULIN ASPART 100 UNIT/ML IJ SOLN
0.0000 [IU] | Freq: Three times a day (TID) | INTRAMUSCULAR | Status: DC
  Administered 2021-10-26: 15 [IU] via SUBCUTANEOUS
  Administered 2021-10-26: 11 [IU] via SUBCUTANEOUS
  Administered 2021-10-27: 5 [IU] via SUBCUTANEOUS
  Administered 2021-10-27 (×2): 8 [IU] via SUBCUTANEOUS
  Administered 2021-10-28: 5 [IU] via SUBCUTANEOUS
  Administered 2021-10-28: 3 [IU] via SUBCUTANEOUS
  Administered 2021-10-28: 8 [IU] via SUBCUTANEOUS
  Administered 2021-10-29: 2 [IU] via SUBCUTANEOUS
  Administered 2021-10-29: 8 [IU] via SUBCUTANEOUS
  Administered 2021-10-29: 3 [IU] via SUBCUTANEOUS
  Administered 2021-10-30 (×2): 5 [IU] via SUBCUTANEOUS
  Filled 2021-10-26 (×8): qty 1

## 2021-10-26 MED ORDER — INSULIN ASPART 100 UNIT/ML IJ SOLN
6.0000 [IU] | Freq: Three times a day (TID) | INTRAMUSCULAR | Status: DC
  Administered 2021-10-26 – 2021-10-30 (×13): 6 [IU] via SUBCUTANEOUS
  Filled 2021-10-26 (×11): qty 1

## 2021-10-26 MED ORDER — ALBUTEROL SULFATE (2.5 MG/3ML) 0.083% IN NEBU
3.0000 mL | INHALATION_SOLUTION | Freq: Four times a day (QID) | RESPIRATORY_TRACT | Status: DC
  Administered 2021-10-26: 3 mL via RESPIRATORY_TRACT
  Filled 2021-10-26: qty 3

## 2021-10-26 MED ORDER — CARVEDILOL 25 MG PO TABS
25.0000 mg | ORAL_TABLET | Freq: Two times a day (BID) | ORAL | Status: DC
  Administered 2021-10-26 – 2021-10-30 (×9): 25 mg via ORAL
  Filled 2021-10-26 (×9): qty 1

## 2021-10-26 MED ORDER — TRAZODONE HCL 50 MG PO TABS
50.0000 mg | ORAL_TABLET | Freq: Every day | ORAL | Status: DC
Start: 2021-10-26 — End: 2021-10-30
  Administered 2021-10-26 – 2021-10-29 (×4): 50 mg via ORAL
  Filled 2021-10-26 (×4): qty 1

## 2021-10-26 MED ORDER — LOSARTAN POTASSIUM 25 MG PO TABS
25.0000 mg | ORAL_TABLET | Freq: Every day | ORAL | Status: DC
  Administered 2021-10-26 – 2021-10-28 (×3): 25 mg via ORAL
  Filled 2021-10-26 (×3): qty 1

## 2021-10-26 MED ORDER — LINAGLIPTIN 5 MG PO TABS
5.0000 mg | ORAL_TABLET | Freq: Every day | ORAL | Status: DC
  Administered 2021-10-26 – 2021-10-30 (×5): 5 mg via ORAL
  Filled 2021-10-26 (×5): qty 1

## 2021-10-26 MED ORDER — SODIUM CHLORIDE 0.9 % IV SOLN: 250.0000 mL | INTRAVENOUS | Status: DC | PRN

## 2021-10-26 MED ORDER — FUROSEMIDE 10 MG/ML IJ SOLN
60.0000 mg | Freq: Two times a day (BID) | INTRAMUSCULAR | Status: DC
  Administered 2021-10-26 – 2021-10-28 (×4): 60 mg via INTRAVENOUS
  Filled 2021-10-26 (×4): qty 6

## 2021-10-26 MED ORDER — APIXABAN 5 MG PO TABS
5.0000 mg | ORAL_TABLET | Freq: Two times a day (BID) | ORAL | Status: DC
  Administered 2021-10-26 – 2021-10-30 (×9): 5 mg via ORAL
  Filled 2021-10-26 (×9): qty 1

## 2021-10-26 MED ORDER — GABAPENTIN 300 MG PO CAPS
300.0000 mg | ORAL_CAPSULE | Freq: Two times a day (BID) | ORAL | Status: DC
  Administered 2021-10-26 – 2021-10-30 (×9): 300 mg via ORAL
  Filled 2021-10-26 (×9): qty 1

## 2021-10-26 MED ORDER — PAROXETINE HCL 20 MG PO TABS
40.0000 mg | ORAL_TABLET | Freq: Every day | ORAL | Status: DC
  Administered 2021-10-26 – 2021-10-29 (×4): 40 mg via ORAL
  Filled 2021-10-26 (×5): qty 2

## 2021-10-26 MED ORDER — ASPIRIN EC 81 MG PO TBEC
81.0000 mg | DELAYED_RELEASE_TABLET | Freq: Every day | ORAL | Status: DC
  Administered 2021-10-26 – 2021-10-30 (×5): 81 mg via ORAL
  Filled 2021-10-26 (×5): qty 1

## 2021-10-26 MED ORDER — IPRATROPIUM-ALBUTEROL 0.5-2.5 (3) MG/3ML IN SOLN: 3.0000 mL | Freq: Four times a day (QID) | RESPIRATORY_TRACT | Status: DC

## 2021-10-26 NOTE — ED Notes (Signed)
Pt with O2 2 L [place], pt will decrease sats to 88-89% while sleeping, but sats return WNL when awake. Pt denies using O2 at home.

## 2021-10-26 NOTE — ED Notes (Signed)
This RN updated family with pt approval.

## 2021-10-26 NOTE — ED Provider Notes (Signed)
Patient was seen during Epic computer downtime.  Please refer to paper chart for details.   Irean Hong, MD 10/26/21 [phone]

## 2021-10-26 NOTE — ED Notes (Signed)
Pt bedding and pads wet with urine, bedding and pads changed, pt cleaned. Purewic replaced

## 2021-10-26 NOTE — ED Notes (Signed)
Admitting MD messaged for diet orders for pt.

## 2021-10-26 NOTE — H&P (Signed)
History and Physical    Patient: Cheyenne Frank [id number] DOB: 04-Nov-1940 DOA: 10/26/2021 DOS: the patient was seen and examined on 10/26/2021 PCP: [facility]  Patient coming from: Home  Chief Complaint: Shortness of breath.  HPI: Cheyenne Frank is a 81 y.o. female with medical history significant of chronic diastolic congestive heart failure, COPD, obstructive sleep apnea not compliant with CPAP, type 2 diabetes, history of stroke, fibromyalgia, essential hypertension who presents to the hospital with worsening shortness of breath, weight gain, leg edema.  Patient has baseline short of breath with minimal exertion, she walks with a walker only for a few steps due to shortness of breath.  She has a worsening short of breath for the last week, she also had frequent paroxysmal atrial dyspnea.  She has leg edema.  She gained 10 pounds in last 3 months.  In the emergency room, chest x-ray showed significant vascular congestion with a small bilateral pleural effusion.  Glucose 318, creatinine 1.62, urine study did not show evidence of UTI.  She was given 40 mg IV Lasix, she is admitted to the hospital for exacerbation congestive heart failure.  Review of Systems: As mentioned in the history of present illness. All other systems reviewed and are negative. Past Medical History:  Diagnosis Date   Anemia of chronic disease    Anxiety    Arthritis    Asthma    no problems since 1999 (after moving into a new home)   Chronic airway obstruction ([place])    Complication of anesthesia    " feel like I'm dying after I wake up"   Diabetes mellitus without complication ([place])    Diverticulosis    Dysrhythmia    unknown type    Fibromyalgia    Flat back syndrome    GERD (gastroesophageal reflux disease)    Hx of transfusion 2011   Hypertension    Itching    Macular degeneration    Nocturia    Restless leg syndrome    Sleep apnea    Dx 1990's unable to wear c-pap -   Stroke  ([place]) 2010   verbal aphasia x 30 min - resolved - no problem since then   Past Surgical History:  Procedure Laterality Date   ABDOMINAL HYSTERECTOMY     rt so   BACK SURGERY  2010 / 2011   BREAST [place]  2015   cataracts removed     [place] / [place]     with T10-S1 fusion   TONSILLECTOMY     TOTAL KNEE ARTHROPLASTY Left 04/06/2013   Procedure: LEFT TOTAL KNEE ARTHROPLASTY;  Surgeon: Gearlean Alf, MD;  Location: [facility];  Service: Orthopedics;  Laterality: Left;   Social History:  reports that she has never smoked. She has never used smokeless tobacco. She reports that she does not drink alcohol and does not use drugs.  Allergies  Allergen Reactions   Codeine Palpitations    Other Reaction: RACES HEART   Dilaudid [Hydromorphone] Other (See Comments) and Rash    Other Reaction: lightheaded, altered ms, nervous, Paranoia paranoid Other Reaction: lightheaded, altered ms, nervous, Paranoia   Demerol [Meperidine] Other (See Comments) and Rash    agitated Agitation   Pravastatin Other (See Comments)    Muscle aches   Amiodarone Other (  See Comments)    Per family pt had twitching while taking and cardiologist took pt off    Lipitor [Atorvastatin] Other (See Comments) and Itching    unknown   Nifedipine Other (See Comments)    unknown   Statins Rash    Makes her crazy   Sulfa Antibiotics Rash   Vancomycin Rash    deafness    Family History  Problem Relation Age of Onset   COPD Sister        had lung transplant   COPD Brother     Prior to Admission medications   Medication Sig Start Date End Date Taking? Authorizing Provider  albuterol (VENTOLIN HFA) 108 (90 Base) MCG/ACT inhaler albuterol sulfate HFA 90 mcg/actuation aerosol inhaler  INHALE 2 PUFFS INTO LUNGS 4 TIMES DAILY 05/19/20  Yes [provider]   apixaban (ELIQUIS) 5 MG TABS tablet Take 1 tablet (5 mg total) by mouth 2 (two) times daily. 05/22/21  Yes Shelly Coss, MD  aspirin EC 81 MG tablet Take 1 tablet (81 mg total) by mouth daily. MAY RESUME ON 12/31/20 12/31/20  Yes Bonnielee Haff, MD  carvedilol (COREG) 25 MG tablet Take 25 mg by mouth 2 (two) times daily.    Yes [provider]  furosemide (LASIX) 20 MG tablet Take 20 mg by mouth daily.    Yes [provider]  gabapentin (NEURONTIN) 300 MG capsule Take 300 mg by mouth 2 (two) times daily.    Yes [provider]  insulin lispro (HUMALOG) 100 UNIT/ML injection Inject 22 Units into the skin 3 (three) times daily with meals.   Yes [provider]  losartan (COZAAR) 25 MG tablet Take 1 tablet (25 mg total) by mouth daily. 05/22/21  Yes Shelly Coss, MD  Multiple Vitamin (MULTIVITAMIN WITH MINERALS) TABS tablet Take 1 tablet by mouth daily.   Yes [provider]  omeprazole (PRILOSEC) 40 MG capsule Take 40 mg by mouth daily.    Yes [provider]  oxybutynin (DITROPAN-XL) 10 MG 24 hr tablet Take 10 mg by mouth daily. 10/09/21  Yes [provider]  PARoxetine (PAXIL) 40 MG tablet Take 40 mg by mouth at bedtime.    Yes [provider]  sitaGLIPtin (JANUVIA) 50 MG tablet Take 50 mg by mouth daily.   Yes [provider]  acetaminophen (TYLENOL) 500 MG tablet Take 500-1,000 mg by mouth every 6 (six) hours as needed for mild pain or fever.     [provider]  amiodarone (PACERONE) 100 MG tablet Take 1 tablet (100 mg total) by mouth daily. Patient not taking: Reported on 10/26/2021 09/17/21   Edwin Dada, MD  insulin glargine (LANTUS) 100 UNIT/ML injection Inject 77 Units into the skin at bedtime.    [provider]  ketoconazole (NIZORAL) 2 % cream Apply topically daily. Patient not taking: Reported on 05/16/2021 12/21/20   [provider]  liver oil-zinc oxide (DESITIN) 40 %  ointment Apply 1 application topically as needed for irritation.    [provider]  multivitamin (RENA-VIT) TABS tablet Take 1 tablet by mouth daily. Patient not taking: Reported on 09/16/2021    [provider]  rosuvastatin (CRESTOR) 5 MG tablet Take 2.5 mg by mouth at bedtime.    [provider]  traZODone (DESYREL) 50 MG tablet Take 50 mg by mouth at bedtime.     [provider]  umeclidinium bromide (INCRUSE ELLIPTA) 62.5 MCG/INH AEPB Inhale 1 puff into the lungs. Patient not  taking: Reported on 05/16/2021 05/19/20   [provider]    Physical Exam: Vitals:   10/26/21 0832 10/26/21 0930 10/26/21 1000 10/26/21 1030  BP: (!) 142/64 127/66 131/62 (!) 144/76  Pulse: 74 67 67 78  Resp: 20 16 17 20   Temp: 98.2 F (36.8 C)     TempSrc: Oral     SpO2: 97% 98% 99% 94%  Weight:      Height:       Eyes: PERRL, lids and conjunctivae normal ENMT: Mucous membranes are moist. Posterior pharynx clear of any exudate or lesions.Normal dentition.  Neck: normal, supple, no masses, no thyromegaly Respiratory: Decreased breathing sounds with diffuse wheezes and rhonchi. Normal respiratory effort. No accessory muscle use.  Cardiovascular: Regular rate and rhythm, no murmurs / rubs / gallops. 2+ extremity edema. 2+ pedal pulses. No carotid bruits.  Abdomen: no tenderness, no masses palpated. No hepatosplenomegaly. Bowel sounds positive.  Musculoskeletal: no clubbing / cyanosis. No joint deformity upper and lower extremities. Good ROM, no contractures. Normal muscle tone.  Skin: no rashes, lesions, ulcers. No induration Neurologic: CN 2-12 grossly intact. Sensation intact, DTR normal. Strength 5/5 in all 4.  Psychiatric: Normal judgment and insight. Alert and oriented x 3. Normal mood.  Data Reviewed: Reviewed on labs chest x-ray and a prior labs.  Assessment and Plan: No notes have been filed under this hospital service. Service: Hospitalist  Acute  hypoxemic respiratory failure secondary to combined COP exacerbation and congestive heart failure pain Acute on chronic diastolic congestive heart failure COPD exacerbation. Patient has worsening short of breath for the last few days, chest x-ray showed vascular congestion consistent with acute on chronic diastolic congestive heart failure.  Reviewed a prior echocardiogram last year, ejection fraction was normal, diastolic dysfunction without valvular disease. Patient had significant volume overload this time, will start IV Lasix at 60 mg twice a day. She also has some bronchospasm, will schedule DuoNeb.  We will try to avoid steroids due to severe hyperglycemia with diabetes.  Uncontrolled type 2 diabetes with hyperglycemia. Currently, will restart long-acting insulin and a scheduled NovoLog the addition to sliding scale insulin.  The dose will be decreased for now, will adjust up Sharon Seller is continue to be elevated.  Morbid obesity. Obesity hypoventilation syndrome. Obstructive sleep apnea. Patient has not been able to tolerate CPAP.  Paroxysmal atrial fibrillation with RVR. Patient received diltiazem in the emergency room, heart rate is better.  Continue anticoagulation.  Chronic kidney disease stage III BP Renal function stable stable pain  Essential hypertension plan Continue some home medicines       Advance Care Planning:   Code Status: Prior Full  Consults: None  Family Communication: None at bedside  Severity of Illness: Patient was admitted to the hospital as inpatient due to severity of disease.  Patient had worsening hypoxemia, volume overload, bronchospasm.  She will stay in the hospital for at least 2 to 3 days.  Author: Sharen Hones, MD 10/26/2021 12:20 PM  For on call review [email or web address].

## 2021-10-26 NOTE — Evaluation (Signed)
Physical Therapy Evaluation Patient Details Name: Cheyenne Frank MRN: [id number] DOB: Jul 24, 1941 Today's Date: 10/26/2021  History of Present Illness  81 y.o. female with medical history significant of chronic diastolic congestive heart failure, COPD, obstructive sleep apnea not compliant with CPAP, type 2 diabetes, history of stroke, fibromyalgia, essential hypertension who presents to the hospital with worsening shortness of breath, weight gain, leg edema.   Clinical Impression  Pt received supine in bed, RN in room administering meds and daughter at bedside. Pt pleasant and agreeable to therapy. She lives with her daughter and is able to remain on the first floor of a 2 story home. She has 5 STE and requires assist with most ADLs and someone to be present during ambulation and transfers. She does endorse a history of falls; daughter states one cause of the falls is pt falls asleep on the toilet. She does not use supplemental O2 at baseline.   Bed mobility appeared to be the most challenging mobility performed this date. She required MAX A to attain sitting upright EOB. STS and ambulation ranged between SUP and CGA using RW. PT assisted pt to restroom followed by longer distance ambulation of 27ft. Pt reports this distance is longer than she typically walks at home. SOB upon completion followed by seated rest break. She was mildly impulsive with standing however listens well to cueing to pause until therapist is ready for mobility. No focal deficits noted in strength; she does have mild weakness in B DF. She desaturated to 88% with bed mobility. Pt was placed on 2L O2 during ambulation with pt maintaining 95-96% oxygen levels. PT recommending pt discharge home with HHPT when medically appropriate. May be O2 testing prior to d/c. Would benefit from skilled PT to address above deficits and promote optimal return to PLOF.      Recommendations for follow up therapy are one component of a  multi-disciplinary discharge planning process, led by the attending physician.  Recommendations may be updated based on patient status, additional functional criteria and insurance authorization.  Follow Up Recommendations Home health PT    Assistance Recommended at Discharge Frequent or constant Supervision/Assistance  Patient can return home with the following  A little help with walking and/or transfers;A little help with bathing/dressing/bathroom;Assistance with cooking/housework;Assist for transportation;Help with stairs or ramp for entrance    Equipment Recommendations None recommended by PT  Recommendations for Other Services       Functional Status Assessment Patient has had a recent decline in their functional status and demonstrates the ability to make significant improvements in function in a reasonable and predictable amount of time.     Precautions / Restrictions Precautions Precautions: Fall Restrictions Weight Bearing Restrictions: No      Mobility  Bed Mobility Overal bed mobility: Needs Assistance Bed Mobility: Supine to Sit     Supine to sit: Max assist, HOB elevated     General bed mobility comments: assist to lift trunk and complete moving RLE off EOB. pt became SOB with sup>sit.    Transfers Overall transfer level: Needs assistance Equipment used: Rolling walker (2 wheels) Transfers: Sit to/from Stand Sit to Stand: Min guard           General transfer comment: CGA for safety - progressed to SUP. 3 reps total.    Ambulation/Gait Ambulation/Gait assistance: Min guard Gait Distance (Feet): 60 Feet Assistive device: Rolling walker (2 wheels) Gait Pattern/deviations: Step-through pattern, Decreased stride length, Wide base of support Gait velocity: decreased  General Gait Details: 70ft + 31ft + 91ft. CGA progressed to SUP with short-distance ambulation; fatigue with 9ft pt reporting this is further than she typically walks. Increased  lateral sway during weight shift.  Stairs            Wheelchair Mobility    Modified Rankin (Stroke Patients Only)       Balance Overall balance assessment: Needs assistance Sitting-balance support: No upper extremity supported, Feet supported Sitting balance-Leahy Scale: Fair Sitting balance - Comments: required CGA upon intial upright. Good throughout remainder of session     Standing balance-Leahy Scale: Fair Standing balance comment: CGA-SUP using BUE of RW                             Pertinent Vitals/Pain Pain Assessment Pain Assessment: No/denies pain    Home Living Family/patient expects to be discharged to:: Private residence Living Arrangements: Children Available Help at Discharge: Family;Available 24 hours/day Type of Home: House Home Access: Stairs to enter Entrance Stairs-Rails: Right Entrance Stairs-Number of Steps: 5   Home Layout: Two level;Able to live on main level with bedroom/bathroom Home Equipment: Rolling Walker (2 wheels);Wheelchair - manual;BSC/3in1;Shower seat - built in;Other (comment) (adjustable bed (HOB features)) Additional Comments: Lives with daughter and son-in-law. Daughter has MS - is on disability so present 24/7 however only able to provide moderate levels of assist    Prior Function Prior Level of Function : Needs assist             Mobility Comments: SUP-CGA with all mobility including ambulation and transfers. Endorses 3-4 falls over the past 6 months; daughter states pt falls asleep on toilet and falls off. ADLs Comments: At least partial assist with all ADLs and IADLs     Hand Dominance        Extremity/Trunk Assessment   Upper Extremity Assessment Upper Extremity Assessment: Overall WFL for tasks assessed;Generalized weakness    Lower Extremity Assessment Lower Extremity Assessment: Overall WFL for tasks assessed;Generalized weakness (4+/5 in BLE; 4-/5 B DF)    Cervical / Trunk  Assessment Cervical / Trunk Assessment: Kyphotic  Communication   Communication: HOH  Cognition Arousal/Alertness: Awake/alert Behavior During Therapy: WFL for tasks assessed/performed Overall Cognitive Status: Within Functional Limits for tasks assessed                                 General Comments: pleasant and agreeable; mildly impulsive        General Comments General comments (skin integrity, edema, etc.): Attempted no supplemental O2 due to baseline without - pt desat to 88% with sup>sit. was palced on 2L O2 for ambulation; pt remained 95-96% on 2L/min.    Exercises     Assessment/Plan    PT Assessment Patient needs continued PT services  PT Problem List Decreased strength;Decreased activity tolerance;Decreased balance;Cardiopulmonary status limiting activity;Decreased mobility       PT Treatment Interventions DME instruction;Therapeutic activities;Gait training;Functional mobility training;Balance training;Patient/family education;Therapeutic exercise    PT Goals (Current goals can be found in the Care Plan section)  Acute Rehab PT Goals Patient Stated Goal: to get stronger PT Goal Formulation: With patient Time For Goal Achievement: 11/09/21 Potential to Achieve Goals: Fair    Frequency Min 2X/week     Co-evaluation               AM-PAC PT "6 Clicks" Mobility  Outcome Measure  Help needed turning from your back to your side while in a flat bed without using bedrails?: A Little Help needed moving from lying on your back to sitting on the side of a flat bed without using bedrails?: A Little Help needed moving to and from a bed to a chair (including a wheelchair)?: A Little Help needed standing up from a chair using your arms (e.g., wheelchair or bedside chair)?: A Little Help needed to walk in hospital room?: A Little Help needed climbing 3-5 steps with a railing? : A Little 6 Click Score: 18    End of Session Equipment Utilized During  Treatment: Gait belt;Oxygen Activity Tolerance: Patient tolerated treatment well;Patient limited by fatigue Patient left: in chair;with call bell/phone within reach;with family/visitor present Nurse Communication: Mobility status PT Visit Diagnosis: Unsteadiness on feet (R26.81);History of falling (Z91.81);Muscle weakness (generalized) (M62.81);Other abnormalities of gait and mobility (R26.89)    Time: [id number] PT Time Calculation (min) (ACUTE ONLY): 33 min   Charges:   PT Evaluation $PT Eval Moderate Complexity: 1 Mod PT Treatments $Therapeutic Exercise: 8-22 mins $Therapeutic Activity: 8-22 mins        Patrina Levering PT, DPT 10/26/21 4:27 PM [phone]

## 2021-10-26 NOTE — ED Notes (Signed)
Pt moved to room 34, pt  on monitor, O2 and purewic connected.

## 2021-10-27 ENCOUNTER — Inpatient Hospital Stay: Payer: Medicare Other

## 2021-10-27 ENCOUNTER — Encounter: Payer: Self-pay | Admitting: Internal Medicine

## 2021-10-27 ENCOUNTER — Inpatient Hospital Stay
Admission: EM | Admit: 2021-10-27 | Discharge: 2021-10-27 | Disposition: A | Discharge status: Home / Self Care | Payer: Medicare Other | Source: Home / Self Care | Attending: Internal Medicine | Admitting: Internal Medicine

## 2021-10-27 DIAGNOSIS — R490 Dysphonia: Secondary | ICD-10-CM

## 2021-10-27 LAB — BASIC METABOLIC PANEL
Anion gap: 9 (ref 5–15)
BUN: 35 mg/dL — ABNORMAL HIGH (ref 8–23)
CO2: 36 mmol/L — ABNORMAL HIGH (ref 22–32)
Calcium: 9.1 mg/dL (ref 8.9–10.3)
Chloride: 95 mmol/L — ABNORMAL LOW (ref 98–111)
Creatinine, Ser: 1.14 mg/dL — ABNORMAL HIGH (ref 0.44–1.00)
GFR, Estimated: 49 mL/min — ABNORMAL LOW (ref >60)
Glucose, Bld: 255 mg/dL — ABNORMAL HIGH (ref 70–99)
Potassium: 4.1 mmol/L (ref 3.5–5.1)
Sodium: 140 mmol/L (ref 135–145)

## 2021-10-27 LAB — ECHOCARDIOGRAM COMPLETE
Height: 66 in
S' Lateral: 2.6 cm
Weight: 4091.74 [oz_av]

## 2021-10-27 LAB — MAGNESIUM: Magnesium: 1.9 mg/dL (ref 1.7–2.4)

## 2021-10-27 LAB — GLUCOSE, CAPILLARY
Glucose-Capillary: 274 mg/dL — ABNORMAL HIGH (ref 70–99)
Glucose-Capillary: 239 mg/dL — ABNORMAL HIGH (ref 70–99)
Glucose-Capillary: 252 mg/dL — ABNORMAL HIGH (ref 70–99)

## 2021-10-27 MED ORDER — IOHEXOL 300 MG/ML  SOLN
75.0000 mL | Freq: Once | INTRAMUSCULAR | Status: AC | PRN
  Administered 2021-10-27: 75 mL via INTRAVENOUS

## 2021-10-27 MED ORDER — INSULIN GLARGINE-YFGN 100 UNIT/ML ~~LOC~~ SOLN
30.0000 [IU] | Freq: Two times a day (BID) | SUBCUTANEOUS | Status: DC
  Administered 2021-10-27 – 2021-10-29 (×5): 30 [IU] via SUBCUTANEOUS
  Filled 2021-10-27 (×7): qty 0.3

## 2021-10-27 NOTE — Assessment & Plan Note (Signed)
Patient is complaining of worsening hoarseness and difficulty swallowing going on since December.  CT soft tissue neck was obtained and it was without any significant abnormality. -Outpatient ENT evaluation

## 2021-10-27 NOTE — Assessment & Plan Note (Signed)
Patient diuresing well.  Currently on 2 L of oxygen. -Continue with IV Lasix 60 mg twice daily -Daily BMP and wait -Strict intake and output

## 2021-10-27 NOTE — Assessment & Plan Note (Signed)
No wheezing today. -Continue with as needed bronchodilators

## 2021-10-27 NOTE — Progress Notes (Signed)
Progress Note   Patient: Cheyenne Frank [id number] DOB: December 20, 1940 DOA: 10/26/2021     1 DOS: the patient was seen and examined on 10/27/2021   Brief hospital course: Partly taken from H&P.  Cheyenne Frank is a 81 y.o. female with medical history significant of chronic diastolic congestive heart failure, COPD, obstructive sleep apnea not compliant with CPAP, type 2 diabetes, history of stroke, fibromyalgia, essential hypertension who presents to the hospital with worsening shortness of breath, weight gain, leg edema.   Patient has baseline short of breath with minimal exertion, she walks with a walker only for a few steps due to shortness of breath.  She has a worsening short of breath for the last week, she also had frequent paroxysmal atrial dyspnea.  She has leg edema.  She gained 10 pounds in last 3 months.   In the emergency room, chest x-ray showed significant vascular congestion with a small bilateral pleural effusion.  Glucose 318, creatinine 1.62, urine study did not show evidence of UTI.  She was given 40 mg IV Lasix, she is admitted to the hospital for exacerbation congestive heart failure.  2/10: CT soft tissue neck was obtained today due to her complaint of worsening hoarseness and difficulty swallowing going on since December.  CT soft neck was without any abnormality. She was advised to follow-up with ENT as an outpatient.  Subjective: Patient was seen and examined today.  She was complaining of worsening hoarseness and some difficulty swallowing going on for more than a month now.  Daughter at bedside.  Lower extremity edema and breathing improving.  Assessment and Plan: * Acute on chronic diastolic (congestive) heart failure ([place])- (present on admission) Patient diuresing well.  Currently on 2 L of oxygen. -Continue with IV Lasix 60 mg twice daily -Daily BMP and wait -Strict intake and output  Acute hypoxemic respiratory failure ([place]) Most likely secondary to above.   Also history of COPD -Continue supplemental oxygen-wean as tolerated  Atrial fibrillation with RVR ([place])- (present on admission) Rate now well controlled.  Received diltiazem in the ED. -Continue with Eliquis -Continue with carvedilol  Type 2 diabetes mellitus with diabetic polyneuropathy, with long-term current use of insulin ([facility]) Uncontrolled with hyperglycemia. -Made Smeglee from once daily to 30 units twice daily -Continue with SSI and mealtime coverage  Essential hypertension- (present on admission) Blood pressure within goal. -Continue home losartan -Continue with Lasix  Sleep apnea- (present on admission) Per patient she was unable to tolerate CPAP. -Encourage patient to use CPAP  Obesity, Class III, BMI 40-49.9 (morbid obesity) ([place])- (present on admission) - Counseling was provided  Stage 3b chronic kidney disease (CKD) ([place])- (present on admission) Renal function seems stable and improving with diuresis. -Monitor renal function -Avoid nephrotoxins  COPD (chronic obstructive pulmonary disease) ([place])- (present on admission) No wheezing today. -Continue with as needed bronchodilators  Hoarseness of voice Patient is complaining of worsening hoarseness and difficulty swallowing going on since December.  CT soft tissue neck was obtained and it was without any significant abnormality. -Outpatient ENT evaluation   Physical Exam: Vitals:   10/27/21 0442 10/27/21 0500 10/27/21 0835 10/27/21 1221  BP: (!) 142/56  (!) 143/51 (!) 132/52  Pulse: 66  72 69  Resp: 20  20 20   Temp: 98.1 F (36.7 C)  98.6 F (37 C)   TempSrc:      SpO2: 95%  97% 98%  Weight:  116 kg    Height:       General.  Morbidly obese elderly lady, in no acute distress. Pulmonary.  Lungs clear bilaterally, normal respiratory effort. CV.  Regular rate and rhythm, no JVD, rub or murmur. Abdomen.  Soft, nontender, nondistended, BS positive. CNS.  Alert and oriented x3.  No focal neurologic  deficit. Extremities.  1+ LE edema, no cyanosis, pulses intact and symmetrical. Psychiatry.  Judgment and insight appears normal.  Data Reviewed: Labs and imaging were reviewed.  Family Communication: Discussed with daughter at bedside  Disposition: Status is: Inpatient Remains inpatient appropriate because: Severity of illness   Planned Discharge Destination: Home DVT prophylaxis.  Eliquis  Time spent: 45 minutes minutes  Author: Lorella Nimrod, MD 10/27/2021 3:50 PM  For on call review [email or web address].

## 2021-10-27 NOTE — Assessment & Plan Note (Signed)
Renal function seems stable and improving with diuresis. -Monitor renal function -Avoid nephrotoxins

## 2021-10-27 NOTE — Assessment & Plan Note (Addendum)
Rate now well controlled.  Received diltiazem in the ED. °-Continue with Eliquis °-Continue with carvedilol °

## 2021-10-27 NOTE — Evaluation (Signed)
Occupational Therapy Evaluation Patient Details Name: Cheyenne Frank MRN: [id number] DOB: 07-27-1941 Today's Date: 10/27/2021   History of Present Illness 81 y.o. female with medical history significant of chronic diastolic congestive heart failure, COPD, obstructive sleep apnea not compliant with CPAP, type 2 diabetes, history of stroke, fibromyalgia, essential hypertension who presents to the hospital with worsening shortness of breath, weight gain, leg edema.   Clinical Impression   Pt seen for OT Evaluation this date in setting of acute hospitalization d/t SOB. Pt lives with daughter and SIL at baseline on first floor of the home and uses AD for fxl mobility and requires some assist for LB ADLs such as donning shoes. She presents this date with decreased fxl activity tolerance and generalized weakness as well as SOB limiting fxl activity tolerance. Her daughter endorses a hx of falling, seemingly from falling asleep on the commode. When OT presents this date, her daughter [name] is getting pt back to bed from the restroom and pt seems disoriented and weak. OT assists pt to bed with MAX A and pt perks up and is more communicative. It's possible that her BP had dropped while on commode and OT takes set of orthostatic VS. Pt currently requiring MOD to MAX A with bed mobility. MOD A for standing LB ADLs such as clothing mgt over hips, and CGA with RW for ADL Transfers. OT completes standing g/h tasks sink-side with pt with close SUPV and VS monitoring. Recommend HHOT f/u for safety with ADLs in the natural environment.   VS check as follows:   Supine: spO2 91% on RA, BP 103/46 (63), pulse 73 Sitting: spO2 87-92% on RA, BP 103/55 (67), pulse 79 Standing: spO2 90% on RA, BP 95/54 (67)-c/o dizziness, pulse 75 Standing after 2-3 mins activity to improve circulation: spO2 97% on RA, BP 118/52 (73), pulse 82     Recommendations for follow up therapy are one component of a multi-disciplinary discharge  planning process, led by the attending physician.  Recommendations may be updated based on patient status, additional functional criteria and insurance authorization.   Follow Up Recommendations  Home health OT    Assistance Recommended at Discharge Frequent or constant Supervision/Assistance  Patient can return home with the following A little help with walking and/or transfers;A lot of help with bathing/dressing/bathroom;Assistance with cooking/housework;Direct supervision/assist for medications management;Assist for transportation;Help with stairs or ramp for entrance    Functional Status Assessment  Patient has had a recent decline in their functional status and demonstrates the ability to make significant improvements in function in a reasonable and predictable amount of time.  Equipment Recommendations  BSC/3in1;Tub/shower seat    Recommendations for Other Services       Precautions / Restrictions Precautions Precautions: Fall Restrictions Weight Bearing Restrictions: No      Mobility Bed Mobility Overal bed mobility: Needs Assistance Bed Mobility: Supine to Sit, Sit to Supine     Supine to sit: Mod assist, HOB elevated Sit to supine: Max assist   General bed mobility comments: MOD A for trunk for sup to sit, MAX A for LEs for sit to sup    Transfers Overall transfer level: Needs assistance Equipment used: Rolling walker (2 wheels) Transfers: Sit to/from Stand Sit to Stand: Min guard           General transfer comment: demos good control, requires MIN cues for RW safety      Balance Overall balance assessment: Needs assistance Sitting-balance support: No upper extremity supported, Feet supported Sitting  balance-Leahy Scale: Fair Sitting balance - Comments: F static standing wiht occasional UE support for static   Standing balance support: Reliant on assistive device for balance Standing balance-Leahy Scale: Fair Standing balance comment: able to  alterante hands frmo RW with static stand for ADLs.                           ADL either performed or assessed with clinical judgement   ADL                                         General ADL Comments: SETUP for seated UB ADLs, CGA for standign UB ADLs with RW for balance. Requires MOD to MAX A for seated LB ADLs including donning socks. Requires MOD A for standing posterior peri care, able to perform atnerior standing peri care with CGA for balance and SETUP for actual task.     Vision Patient Visual Report: No change from baseline       Perception     Praxis      Pertinent Vitals/Pain Pain Assessment Pain Assessment: Faces Faces Pain Scale: Hurts little more Pain Location: back Pain Descriptors / Indicators: Discomfort, Grimacing Pain Intervention(s): Limited activity within patient's tolerance, Monitored during session, Repositioned     Hand Dominance Right   Extremity/Trunk Assessment Upper Extremity Assessment Upper Extremity Assessment: Overall WFL for tasks assessed;Generalized weakness (ROM WFL, MMT grossly 4-/5)   Lower Extremity Assessment Lower Extremity Assessment: Overall WFL for tasks assessed;Generalized weakness       Communication Communication Communication: HOH   Cognition Arousal/Alertness: Awake/alert Behavior During Therapy: WFL for tasks assessed/performed Overall Cognitive Status: Within Functional Limits for tasks assessed                                 General Comments: pleasant and agreeable; mildly impulsive, some occasionally garbled speech, seemingly when she might be hypotensive after positional change     General Comments  Pt not wearing O2 upon arrival - SpO2 85% at rest. On 2L, pt improved to 93% or greater including with mobility.    Exercises Other Exercises Other Exercises: OT ed with pt and dtr re: role of OT, safety considerations including fall prevention r/t VS   Shoulder  Instructions      Home Living Family/patient expects to be discharged to:: Private residence Living Arrangements: Children Available Help at Discharge: Family;Available 24 hours/day Type of Home: House Home Access: Stairs to enter [employer] of Steps: 5   Home Layout: Two level;Able to live on main level with bedroom/bathroom     Bathroom Shower/Tub: Other (comment)   Bathroom Toilet: Handicapped height Bathroom Accessibility: Yes   Home Equipment: Agricultural consultant (2 wheels);Wheelchair - manual;BSC/3in1;Shower seat - built in;Other (comment)   Additional Comments: Lives with daughter and son-in-law. Daughter has MS - is on disability so present 24/7 however only able to provide moderate levels of assist      Prior Functioning/Environment Prior Level of Function : Needs assist             Mobility Comments: SUP-CGA with all mobility including ambulation and transfers. Endorses 3-4 falls over the past 6 months; daughter states pt falls asleep on toilet and falls off. ADLs Comments: At least partial assist with all ADLs and IADLs  OT Problem List: Decreased strength;Decreased activity tolerance      OT Treatment/Interventions: Self-care/ADL training;Therapeutic exercise;Therapeutic activities;DME and/or AE instruction;Patient/family education    OT Goals(Current goals can be found in the care plan section) Acute Rehab OT Goals Patient Stated Goal: go home OT Goal Formulation: With patient/family Time For Goal Achievement: 11/10/21 Potential to Achieve Goals: Good ADL Goals Pt Will Perform Grooming: with set-up;with supervision;standing Pt Will Transfer to Toilet: with supervision;ambulating Pt Will Perform Toileting - Clothing Manipulation and hygiene: with supervision;sitting/lateral leans Pt/caregiver will Perform Home Exercise Program: Increased strength;Both right and left upper extremity;With minimal assist  OT Frequency: Min 2X/week     Co-evaluation              AM-PAC OT "6 Clicks" Daily Activity     Outcome Measure Help from another person eating meals?: None Help from another person taking care of personal grooming?: A Little Help from another person toileting, which includes using toliet, bedpan, or urinal?: A Lot Help from another person bathing (including washing, rinsing, drying)?: A Lot Help from another person to put on and taking off regular upper body clothing?: A Little Help from another person to put on and taking off regular lower body clothing?: A Lot 6 Click Score: 16   End of Session Equipment Utilized During Treatment: Gait belt;Rolling walker (2 wheels) Nurse Communication: Mobility status  Activity Tolerance: Patient tolerated treatment well Patient left: in bed;with call bell/phone within reach;with bed alarm set;with family/visitor present  OT Visit Diagnosis: Unsteadiness on feet (R26.81);Muscle weakness (generalized) (M62.81)                Time: 5188-4166 OT Time Calculation (min): 31 min Charges:  OT General Charges $OT Visit: 1 Visit OT Evaluation $OT Eval Moderate Complexity: 1 Mod OT Treatments $Self Care/Home Management : 8-22 mins  Rejeana Brock, MS, OTR/L ascom [phone] 10/27/21, 6:39 PM

## 2021-10-27 NOTE — Progress Notes (Signed)
*  PRELIMINARY RESULTS* Echocardiogram 2D Echocardiogram has been performed.  Cristela Blue 10/27/2021, 8:25 AM

## 2021-10-27 NOTE — Assessment & Plan Note (Signed)
Per patient she was unable to tolerate CPAP. -Encourage patient to use CPAP

## 2021-10-27 NOTE — Assessment & Plan Note (Signed)
Counseling was provided. 

## 2021-10-27 NOTE — Assessment & Plan Note (Signed)
Blood pressure within goal. -Continue home losartan -Continue with Lasix

## 2021-10-27 NOTE — Hospital Course (Addendum)
Partly taken from H&P.  Cheyenne Frank is a 81 y.o. female with medical history significant of chronic diastolic congestive heart failure, COPD, obstructive sleep apnea not compliant with CPAP, type 2 diabetes, history of stroke, fibromyalgia, essential hypertension who presents to the hospital with worsening shortness of breath, weight gain, leg edema.   Patient has baseline short of breath with minimal exertion, she walks with a walker only for a few steps due to shortness of breath.  She has a worsening short of breath for the last week, she also had frequent paroxysmal atrial dyspnea.  She has leg edema.  She gained 10 pounds in last 3 months.   In the emergency room, chest x-ray showed significant vascular congestion with a small bilateral pleural effusion.  Glucose 318, creatinine 1.62, urine study did not show evidence of UTI.  She was given 40 mg IV Lasix, she is admitted to the hospital for exacerbation congestive heart failure.  2/10: CT soft tissue neck was obtained today due to her complaint of worsening hoarseness and difficulty swallowing going on since December.  CT soft neck was without any abnormality. She was advised to follow-up with ENT as an outpatient.  2/11: Clinically improving.  She was feeling little jittery and does not think that she can go home today.  Patient was also ambulated with pulse ox, desaturating with ambulation requiring up to 3 L of oxygen.  2/12: More worsening of creatinine at 2.37.  Holding diuretic and losartan, give 500 cc IVF. Most likely be discharged tomorrow if renal function started improving.

## 2021-10-27 NOTE — Assessment & Plan Note (Signed)
Most likely secondary to above.  Also history of COPD -Continue supplemental oxygen-wean as tolerated

## 2021-10-27 NOTE — Progress Notes (Signed)
Physical Therapy Treatment Patient Details Name: Cheyenne Frank MRN: [id number] DOB: 08-Jan-1941 Today's Date: 10/27/2021   History of Present Illness 81 y.o. female with medical history significant of chronic diastolic congestive heart failure, COPD, obstructive sleep apnea not compliant with CPAP, type 2 diabetes, history of stroke, fibromyalgia, essential hypertension who presents to the hospital with worsening shortness of breath, weight gain, leg edema.    PT Comments    Pt received in bed, appears to be in distress and reporting back pain. Pt states she just returned from x-ray and is now in significant pain. RN notified - Tylenol to be administered. PT educates on repositioning for improved comfort, pt agreeable. Pt required increased assist with sitting trunk to upright during bed mobility. She asks to walk to bathroom to void prior to transferring to recliner. STS from both EOB and toilet CGA and ambulation SUP using RW. SpO2 at 85% on arrival to room with pt on RA. PT placed 2L/min via [place]; pt improved to 93% or greater for remainder of session. Pt appreciative and states that back pain is mildly improved via repositioning. Continue to rec HHPT upon hospital d/c to address strength, stability and endurance deficits during functional mobility.    Recommendations for follow up therapy are one component of a multi-disciplinary discharge planning process, led by the attending physician.  Recommendations may be updated based on patient status, additional functional criteria and insurance authorization.  Follow Up Recommendations  Home health PT     Assistance Recommended at Discharge Frequent or constant Supervision/Assistance  Patient can return home with the following A little help with walking and/or transfers;A little help with bathing/dressing/bathroom;Assistance with cooking/housework;Assist for transportation;Help with stairs or ramp for entrance   Equipment Recommendations  None  recommended by PT    Recommendations for Other Services       Precautions / Restrictions Precautions Precautions: Fall Restrictions Weight Bearing Restrictions: No     Mobility  Bed Mobility Overal bed mobility: Needs Assistance Bed Mobility: Supine to Sit     Supine to sit: Max assist, HOB elevated     General bed mobility comments: Maximal lift of trunk. Pt mobilized BLE herself.    Transfers Overall transfer level: Needs assistance Equipment used: Rolling walker (2 wheels) Transfers: Sit to/from Stand Sit to Stand: Min guard           General transfer comment: CGA for safety. Slow/guarded movements today 2/2 pain    Ambulation/Gait Ambulation/Gait assistance: Supervision Gait Distance (Feet): 15 Feet Assistive device: Rolling walker (2 wheels) Gait Pattern/deviations: Step-through pattern, Decreased stride length, Wide base of support Gait velocity: decreased     General Gait Details: 62ft x2 reps for ambulatory toilet transfer.Close SUP for pt safety due to back pain this session. Increased lateral sway during weight shift.   Stairs             Wheelchair Mobility    Modified Rankin (Stroke Patients Only)       Balance Overall balance assessment: Needs assistance Sitting-balance support: No upper extremity supported, Feet supported Sitting balance-Leahy Scale: Fair Sitting balance - Comments: required CGA upon intial upright. Good throughout remainder of session     Standing balance-Leahy Scale: Fair Standing balance comment: CGA-SUP using BUE of RW                            Cognition Arousal/Alertness: Awake/alert Behavior During Therapy: WFL for tasks assessed/performed Overall Cognitive Status: Within  Functional Limits for tasks assessed                                          Exercises      General Comments General comments (skin integrity, edema, etc.): Pt not wearing O2 upon arrival - SpO2 85%  at rest. On 2L, pt improved to 93% or greater including with mobility.      Pertinent Vitals/Pain Pain Assessment Pain Assessment: Faces Faces Pain Scale: Hurts whole lot Pain Location: back Pain Descriptors / Indicators: Aching, Discomfort, Grimacing Pain Intervention(s): Limited activity within patient's tolerance, Monitored during session, Repositioned, RN gave pain meds during session    Home Living                          Prior Function            PT Goals (current goals can now be found in the care plan section) Acute Rehab PT Goals Patient Stated Goal: to get stronger PT Goal Formulation: With patient Time For Goal Achievement: 11/09/21 Potential to Achieve Goals: Fair    Frequency    Min 2X/week      PT Plan      Co-evaluation              AM-PAC PT "6 Clicks" Mobility   Outcome Measure  Help needed turning from your back to your side while in a flat bed without using bedrails?: A Little Help needed moving from lying on your back to sitting on the side of a flat bed without using bedrails?: A Lot Help needed moving to and from a bed to a chair (including a wheelchair)?: A Little Help needed standing up from a chair using your arms (e.g., wheelchair or bedside chair)?: A Little Help needed to walk in hospital room?: A Little Help needed climbing 3-5 steps with a railing? : A Little 6 Click Score: 17    End of Session Equipment Utilized During Treatment: Oxygen Activity Tolerance: Patient tolerated treatment well;Patient limited by pain Patient left: in chair;with call bell/phone within reach;with nursing/sitter in room Nurse Communication: Mobility status PT Visit Diagnosis: Unsteadiness on feet (R26.81);History of falling (Z91.81);Muscle weakness (generalized) (M62.81);Other abnormalities of gait and mobility (R26.89)     Time: 1415-1440 PT Time Calculation (min) (ACUTE ONLY): 25 min  Charges:  $Therapeutic Exercise: 8-22  mins $Therapeutic Activity: 8-22 mins                     Basilia Jumbo PT, DPT 10/27/21 3:27 PM [phone]

## 2021-10-27 NOTE — Assessment & Plan Note (Signed)
Uncontrolled with hyperglycemia. -Made Smeg[place] from once daily to 30 units twice daily -Continue with SSI and mealtime coverage

## 2021-10-28 LAB — BASIC METABOLIC PANEL
Anion gap: 8 (ref 5–15)
BUN: 44 mg/dL — ABNORMAL HIGH (ref 8–23)
CO2: 39 mmol/L — ABNORMAL HIGH (ref 22–32)
Calcium: 9.1 mg/dL (ref 8.9–10.3)
Chloride: 92 mmol/L — ABNORMAL LOW (ref 98–111)
Creatinine, Ser: 1.45 mg/dL — ABNORMAL HIGH (ref 0.44–1.00)
GFR, Estimated: 36 mL/min — ABNORMAL LOW (ref >60)
Glucose, Bld: 268 mg/dL — ABNORMAL HIGH (ref 70–99)
Potassium: 4.9 mmol/L (ref 3.5–5.1)
Sodium: 139 mmol/L (ref 135–145)

## 2021-10-28 LAB — GLUCOSE, CAPILLARY
Glucose-Capillary: 261 mg/dL — ABNORMAL HIGH (ref 70–99)
Glucose-Capillary: 238 mg/dL — ABNORMAL HIGH (ref 70–99)
Glucose-Capillary: 194 mg/dL — ABNORMAL HIGH (ref 70–99)
Glucose-Capillary: 211 mg/dL — ABNORMAL HIGH (ref 70–99)

## 2021-10-28 MED ORDER — TORSEMIDE 20 MG PO TABS: 40.0000 mg | ORAL_TABLET | Freq: Every day | ORAL | Status: DC

## 2021-10-28 NOTE — Assessment & Plan Note (Signed)
Mild increase in creatinine with diuresis today. -Monitor renal function -Avoid nephrotoxins

## 2021-10-28 NOTE — Progress Notes (Signed)
SATURATION QUALIFICATIONS: (This note is used to comply with regulatory documentation for home oxygen)  Patient Saturations on Room Air at Rest  90%  Patient Saturations on Room Air while Ambulating  84%  Patient Saturations on 3 Liters of oxygen while Ambulating 92%  Please briefly explain why patient needs home oxygen: 

## 2021-10-28 NOTE — Assessment & Plan Note (Signed)
Rate now well controlled.  Received diltiazem in the ED. -Continue with Eliquis -Continue with carvedilol

## 2021-10-28 NOTE — Assessment & Plan Note (Signed)
Patient diuresing well.  Currently on 2 L of oxygen.  Mild increase in creatinine and BUN. -Discontinue IV Lasix, will start on torsemide 40 mg daily from tomorrow -Daily BMP and wait -Strict intake and output

## 2021-10-28 NOTE — Assessment & Plan Note (Signed)
Most likely secondary to above.  Also history of COPD. °Patient was desaturating with ambulation, most likely will require oxygen to go home. °-Continue supplemental oxygen-wean as tolerated °

## 2021-10-28 NOTE — Progress Notes (Signed)
Mobility Specialist - Progress Note   10/28/21 1200  Mobility  Activity Refused mobility     2nd attempt this date. Pt declined mobility; no reason specified. Pt just recently finished ambulating with nursing staff. Will attempt session another date/time.   Filiberto Pinks Mobility Specialist 10/28/21, 1:01 PM

## 2021-10-28 NOTE — Assessment & Plan Note (Signed)
Blood pressure mildly elevated. -Continue home losartan -Received 1 dose of Lasix this morning, we will switch to torsemide from tomorrow

## 2021-10-28 NOTE — TOC Initial Note (Addendum)
Transition of Care [facility]) - Initial/Assessment Note    Patient Details  Name: Cheyenne Frank MRN: [id number] Date of Birth: [birth year]  [facility] CM/SW Contact:    Alberteen Sam, LCSW Phone Number: 10/28/2021, 9:05 AM  Clinical Narrative:                  CSW spoke with patient regarding home health services,agreeable for PT OT and RN .  CSW has sent out referrals pending accepting [facility] agency at this time.   TOC will continue to follow for any additional discharge needs.    Expected Discharge Plan: [place] Barriers to Discharge: Continued Medical Work up   Patient Goals and CMS Choice Patient states their goals for this hospitalization and ongoing recovery are:: to go home CMS Medicare.gov Compare Post Acute Care list provided to:: Patient Choice offered to / list presented to : Patient  Expected Discharge Plan and Services Expected Discharge Plan: [place]                                   HH Arranged: PT, OT, RN [facility] Agency: [place] ([place]) Date [facility] Agency Contacted: 10/28/21 Time HH Agency Contacted: [phone] Representative spoke with at [place]: [place] Arrangements/Services   Lives with:: Self   Do you feel safe going back to the place where you live?: Yes               Activities of Daily Living Home Assistive Devices/Equipment: Environmental consultant (specify type), Wheelchair ADL Screening (condition at time of admission) Patient's cognitive ability adequate to safely complete daily activities?: Yes Is the patient deaf or have difficulty hearing?: Yes Does the patient have difficulty seeing, even when wearing glasses/contacts?: No Does the patient have difficulty concentrating, remembering, or making decisions?: No Patient able to express need for assistance with ADLs?: Yes Does the patient have difficulty dressing or bathing?: Yes Independently performs ADLs?: No Communication:  Independent Dressing (OT): Needs assistance Is this a change from baseline?: Change from baseline, expected to last >3 days Grooming: Needs assistance Is this a change from baseline?: Change from baseline, expected to last >3 days Feeding: Independent Bathing: Needs assistance Is this a change from baseline?: Change from baseline, expected to last >3 days Toileting: Needs assistance Is this a change from baseline?: Change from baseline, expected to last >3days In/Out Bed: Needs assistance Is this a change from baseline?: Change from baseline, expected to last >3 days Walks in Home: Independent with device (comment) Does the patient have difficulty walking or climbing stairs?: Yes Weakness of Legs: Both Weakness of Arms/Hands: None  Permission Sought/Granted                  Emotional Assessment       Orientation: : Oriented to Self, Oriented to Place, Oriented to  Time, Oriented to Situation Alcohol / Substance Use: Not Applicable Psych Involvement: No (comment)  Admission diagnosis:  SOB (shortness of breath) [R06.02] Hypoxia [R09.02] Atrial fibrillation with rapid ventricular response (HCC) [I48.91] AKI (acute kidney injury) ([place]) [N17.9] Acute on chronic diastolic (congestive) heart failure (HCC) [I50.33] Chest pain, unspecified type [R07.9] Acute on chronic congestive heart failure, unspecified heart failure type [facility]) [I50.9] Patient Active Problem List   Diagnosis Date Noted   Hoarseness of voice 10/27/2021   Acute on chronic diastolic (congestive) heart failure ([place])  10/26/2021   Acute hypoxemic respiratory failure ([place]) 10/26/2021   Atrial flutter with rapid ventricular response ([place]) 09/16/2021   Stage 3b chronic kidney disease (CKD) ([place]) 09/16/2021   Cerebrovascular disease 09/16/2021   COPD (chronic obstructive pulmonary disease) ([place]) 09/16/2021   Depression [id number]   Acute diastolic CHF (congestive heart failure) (HCC)    Obesity, Class III, BMI  40-49.9 (morbid obesity) ([place] )    Atrial fibrillation with RVR ([place]) 05/16/2021   Head injury 12/27/2020   SDH (subdural hematoma) 12/11/2013   CVA (cerebral infarction) 12/11/2013   Type 2 diabetes mellitus with diabetic polyneuropathy, with long-term current use of insulin ([place]) 04/07/2013   Essential hypertension 04/07/2013   Sleep apnea 04/07/2013   OA (osteoarthritis) of knee 04/06/2013   PCP:  [facility]:   [place], [place] - [address] [place] [address] Phone: [phone] Fax: [phone]     Social Determinants of Health (SDOH) Interventions    Readmission Risk Interventions No flowsheet data found.

## 2021-10-28 NOTE — Progress Notes (Signed)
Progress Note   Patient: Cheyenne Frank DOB: 1941/06/30 DOA: 10/26/2021     2 DOS: the patient was seen and examined on 10/28/2021   Brief hospital course: Partly taken from H&P.  Cheyenne Frank is a 81 y.o. female with medical history significant of chronic diastolic congestive heart failure, COPD, obstructive sleep apnea not compliant with CPAP, type 2 diabetes, history of stroke, fibromyalgia, essential hypertension who presents to the hospital with worsening shortness of breath, weight gain, leg edema.   Patient has baseline short of breath with minimal exertion, she walks with a walker only for a few steps due to shortness of breath.  She has a worsening short of breath for the last week, she also had frequent paroxysmal atrial dyspnea.  She has leg edema.  She gained 10 pounds in last 3 months.   In the emergency room, chest x-ray showed significant vascular congestion with a small bilateral pleural effusion.  Glucose 318, creatinine 1.62, urine study did not show evidence of UTI.  She was given 40 mg IV Lasix, she is admitted to the hospital for exacerbation congestive heart failure.  2/10: CT soft tissue neck was obtained today due to her complaint of worsening hoarseness and difficulty swallowing going on since December.  CT soft neck was without any abnormality. She was advised to follow-up with ENT as an outpatient.  2/11: Clinically improving.  She was feeling little jittery and does not think that she can go home today.  Patient was also ambulated with pulse ox, desaturating with ambulation requiring up to 3 L of oxygen.   Assessment and Plan: * Acute on chronic diastolic (congestive) heart failure (HCC)- (present on admission) Patient diuresing well.  Currently on 2 L of oxygen.  Mild increase in creatinine and BUN. -Discontinue IV Lasix, will start on torsemide 40 mg daily from tomorrow -Daily BMP and wait -Strict intake and output  Acute hypoxemic respiratory  failure (HCC) Most likely secondary to above.  Also history of COPD. Patient was desaturating with ambulation, most likely will require oxygen to go home. -Continue supplemental oxygen-wean as tolerated  Atrial fibrillation with RVR (HCC)- (present on admission) Rate now well controlled.  Received diltiazem in the ED. -Continue with Eliquis -Continue with carvedilol  Type 2 diabetes mellitus with diabetic polyneuropathy, with long-term current use of insulin (HCC) Uncontrolled with hyperglycemia. -Made Smeglee from once daily to 30 units twice daily -Continue with SSI and mealtime coverage  Essential hypertension- (present on admission) Blood pressure mildly elevated. -Continue home losartan -Received 1 dose of Lasix this morning, we will switch to torsemide from tomorrow  Sleep apnea- (present on admission) Per patient she was unable to tolerate CPAP. -Encourage patient to use CPAP  Obesity, Class III, BMI 40-49.9 (morbid obesity) (HCC)- (present on admission) - Counseling was provided  Stage 3b chronic kidney disease (CKD) (HCC)- (present on admission) Mild increase in creatinine with diuresis today. -Monitor renal function -Avoid nephrotoxins  COPD (chronic obstructive pulmonary disease) (HCC)- (present on admission) No wheezing today. -Continue with as needed bronchodilators  Hoarseness of voice Patient is complaining of worsening hoarseness and difficulty swallowing going on since December.  CT soft tissue neck was obtained and it was without any significant abnormality. -Outpatient ENT evaluation   Subjective: Patient seen and examined today.  Complaining of some jitteriness.  She appears little anxious and does not think that she is ready to go home today.  Physical Exam: Vitals:   10/28/21 0516 10/28/21 0733 10/28/21  1159 10/28/21 1610  BP: (!) 142/52 (!) 154/99 (!) 100/39 130/79  Pulse: (!) 57 67 71 73  Resp: 16 18 19 19   Temp: 97.7 F (36.5 C) 97.9 F (36.6  C) 97.9 F (36.6 C) 97.7 F (36.5 C)  TempSrc:      SpO2: 99% 97% 95% 93%  Weight:      Height:       General.  Obese elderly lady, in no acute distress. Pulmonary.  Few scattered wheeze, normal respiratory effort. CV.  Regular rate and rhythm, no JVD, rub or murmur. Abdomen.  Soft, nontender, nondistended, BS positive. CNS.  Alert and oriented .  No focal neurologic deficit. Extremities.  No edema, no cyanosis, pulses intact and symmetrical. Psychiatry.  Judgment and insight appears normal.  Data Reviewed: Notes and labs reviewed  Family Communication:   Disposition: Status is: Inpatient Remains inpatient appropriate because: Severity of illness  Planned Discharge Destination: Home with Home Health  DVT prophylaxis.  Eliquis  Time spent: 40 minutes  This record has been created using Systems analyst. Errors have been sought and corrected,but may not always be located. Such creation errors do not reflect on the standard of care.  Author: Lorella Nimrod, MD 10/28/2021 4:15 PM  For on call review [email or web address].

## 2021-10-28 NOTE — TOC Transition Note (Signed)
Transition of Care [facility]) - CM/SW Discharge Note   Patient Details  Name: BELITA WARSAME MRN: [id number] Date of Birth: 1940/12/11  [facility]) CM/SW Contact:  Susa Simmonds, LCSWA Phone Number: 10/28/2021, 10:15 AM   Clinical Narrative:    CSW spoke with Kandee Keen from [facility] who stated they can take patient for Home Health PT/OT, RN services.      Barriers to Discharge: Continued Medical Work up   Patient Goals and CMS Choice Patient states their goals for this hospitalization and ongoing recovery are:: to go home CMS Medicare.gov Compare Post Acute Care list provided to:: Patient Choice offered to / list presented to : Patient  Discharge Placement                       Discharge Plan and Services                          HH Arranged: PT, OT, RN [facility] Agency: [facility] Date [facility] Agency Contacted: 10/28/21 Time HH Agency Contacted: [phone] Representative spoke with at [facility] Agency: Barbara Cower  Social Determinants of Health (SDOH) Interventions     Readmission Risk Interventions No flowsheet data found.

## 2021-10-29 LAB — BASIC METABOLIC PANEL
Anion gap: 7 (ref 5–15)
BUN: 61 mg/dL — ABNORMAL HIGH (ref 8–23)
CO2: 37 mmol/L — ABNORMAL HIGH (ref 22–32)
Calcium: 8.8 mg/dL — ABNORMAL LOW (ref 8.9–10.3)
Chloride: 90 mmol/L — ABNORMAL LOW (ref 98–111)
Creatinine, Ser: 2.37 mg/dL — ABNORMAL HIGH (ref 0.44–1.00)
GFR, Estimated: 20 mL/min — ABNORMAL LOW (ref >60)
Glucose, Bld: 228 mg/dL — ABNORMAL HIGH (ref 70–99)
Potassium: 4.4 mmol/L (ref 3.5–5.1)
Sodium: 134 mmol/L — ABNORMAL LOW (ref 135–145)

## 2021-10-29 LAB — GLUCOSE, CAPILLARY
Glucose-Capillary: 278 mg/dL — ABNORMAL HIGH (ref 70–99)
Glucose-Capillary: 183 mg/dL — ABNORMAL HIGH (ref 70–99)
Glucose-Capillary: 212 mg/dL — ABNORMAL HIGH (ref 70–99)

## 2021-10-29 MED ORDER — INSULIN GLARGINE-YFGN 100 UNIT/ML ~~LOC~~ SOLN
35.0000 [IU] | Freq: Two times a day (BID) | SUBCUTANEOUS | Status: DC
  Administered 2021-10-29 – 2021-10-30 (×2): 35 [IU] via SUBCUTANEOUS
  Filled 2021-10-29 (×4): qty 0.35

## 2021-10-29 MED ORDER — SODIUM CHLORIDE 0.9 % IV SOLN: INTRAVENOUS | Status: DC

## 2021-10-29 NOTE — Assessment & Plan Note (Signed)
Rate now well controlled.  Received diltiazem in the ED. °-Continue with Eliquis °-Continue with carvedilol °

## 2021-10-29 NOTE — Assessment & Plan Note (Signed)
Uncontrolled with hyperglycemia.  CBG remained elevated -Increase Semglee to 35 units twice daily -Continue with SSI and mealtime coverage

## 2021-10-29 NOTE — Assessment & Plan Note (Signed)
Most likely secondary to above.  Also history of COPD. Patient was desaturating with ambulation, most likely will require oxygen to go home. -Continue supplemental oxygen-wean as tolerated

## 2021-10-29 NOTE — Assessment & Plan Note (Signed)
Patient diuresing well.  Currently on 2 L of oxygen.  Renal function with more worsening of BUN and creatinine. -Holding torsemide for today -Daily BMP and wait -Strict intake and output

## 2021-10-29 NOTE — Progress Notes (Signed)
Progress Note   Patient: Cheyenne Frank [id number] DOB: 06/06/1941 DOA: 10/26/2021     3 DOS: the patient was seen and examined on 10/29/2021   Brief hospital course: Partly taken from H&P.  Cheyenne Frank is a 81 y.o. female with medical history significant of chronic diastolic congestive heart failure, COPD, obstructive sleep apnea not compliant with CPAP, type 2 diabetes, history of stroke, fibromyalgia, essential hypertension who presents to the hospital with worsening shortness of breath, weight gain, leg edema.   Patient has baseline short of breath with minimal exertion, she walks with a walker only for a few steps due to shortness of breath.  She has a worsening short of breath for the last week, she also had frequent paroxysmal atrial dyspnea.  She has leg edema.  She gained 10 pounds in last 3 months.   In the emergency room, chest x-ray showed significant vascular congestion with a small bilateral pleural effusion.  Glucose 318, creatinine 1.62, urine study did not show evidence of UTI.  She was given 40 mg IV Lasix, she is admitted to the hospital for exacerbation congestive heart failure.  2/10: CT soft tissue neck was obtained today due to her complaint of worsening hoarseness and difficulty swallowing going on since December.  CT soft neck was without any abnormality. She was advised to follow-up with ENT as an outpatient.  2/11: Clinically improving.  She was feeling little jittery and does not think that she can go home today.  Patient was also ambulated with pulse ox, desaturating with ambulation requiring up to 3 L of oxygen.  2/12: More worsening of creatinine at 2.37.  Holding diuretic and losartan, give 500 cc IVF. Most likely be discharged tomorrow if renal function started improving.   Assessment and Plan: * Acute on chronic diastolic (congestive) heart failure ([place])- (present on admission) Patient diuresing well.  Currently on 2 L of oxygen.  Renal function with  more worsening of BUN and creatinine. -Holding torsemide for today -Daily BMP and wait -Strict intake and output  Acute hypoxemic respiratory failure ([place]) Most likely secondary to above.  Also history of COPD. Patient was desaturating with ambulation, most likely will require oxygen to go home. -Continue supplemental oxygen-wean as tolerated  Atrial fibrillation with RVR ([place])- (present on admission) Rate now well controlled.  Received diltiazem in the ED. -Continue with Eliquis -Continue with carvedilol  Type 2 diabetes mellitus with diabetic polyneuropathy, with long-term current use of insulin (HCC) Uncontrolled with hyperglycemia.  CBG remained elevated -Increase Semglee to 35 units twice daily -Continue with SSI and mealtime coverage  Essential hypertension- (present on admission) Blood pressure within goal. -Continue home losartan -Holding torsemide today  Sleep apnea- (present on admission) Per patient she was unable to tolerate CPAP. -Encourage patient to use CPAP  Obesity, Class III, BMI 40-49.9 (morbid obesity) ([place])- (present on admission) - Counseling was provided  Stage 3b chronic kidney disease (CKD) ([place])- (present on admission) Increase in creatinine with diuresis today. -Giving some gentle fluid -Holding diuretic -Monitor renal function -Avoid nephrotoxins  COPD (chronic obstructive pulmonary disease) ([place])- (present on admission) No wheezing today. -Continue with as needed bronchodilators  Hoarseness of voice Patient is complaining of worsening hoarseness and difficulty swallowing going on since December.  CT soft tissue neck was obtained and it was without any significant abnormality. -Outpatient ENT evaluation   Subjective: Patient was seen and examined today.  No new complaints.  Daughter at bedside.  Physical Exam: Vitals:   10/29/21  0900 10/29/21 1600 10/29/21 1609 10/29/21 1610  BP: 103/80     Pulse: 70     Resp: 20     Temp: 98.3 F  (36.8 C)     TempSrc: Oral     SpO2: 98% 96% (!) 84% 93%  Weight:      Height:       General.  Well-developed, obese elderly lady, in no acute distress. Pulmonary.  Lungs clear bilaterally, normal respiratory effort. CV.  Regular rate and rhythm, no JVD, rub or murmur. Abdomen.  Soft, nontender, nondistended, BS positive. CNS.  Alert and oriented x3.  No focal neurologic deficit. Extremities.  No edema, no cyanosis, pulses intact and symmetrical. Psychiatry.  Judgment and insight appears normal.  Data Reviewed: All available data and labs reviewed.  Family Communication: Discussed with daughter at bedside  Disposition: Status is: Inpatient Remains inpatient appropriate because: Severity of illness    Planned Discharge Destination: Home with Home Health  DVT prophylaxis.  Eliquis  Time spent: 45 minutes  This record has been created using Systems analyst. Errors have been sought and corrected,but may not always be located. Such creation errors do not reflect on the standard of care.  Author: Lorella Nimrod, MD 10/29/2021 4:59 PM  For on call review [email or web address].

## 2021-10-29 NOTE — Assessment & Plan Note (Signed)
Blood pressure within goal. -Continue home losartan -Holding torsemide today

## 2021-10-29 NOTE — Assessment & Plan Note (Signed)
Increase in creatinine with diuresis today. -Giving some gentle fluid -Holding diuretic -Monitor renal function -Avoid nephrotoxins

## 2021-10-30 LAB — BASIC METABOLIC PANEL
Anion gap: 7 (ref 5–15)
BUN: 66 mg/dL — ABNORMAL HIGH (ref 8–23)
CO2: 35 mmol/L — ABNORMAL HIGH (ref 22–32)
Calcium: 8.9 mg/dL (ref 8.9–10.3)
Chloride: 95 mmol/L — ABNORMAL LOW (ref 98–111)
Creatinine, Ser: 1.86 mg/dL — ABNORMAL HIGH (ref 0.44–1.00)
GFR, Estimated: 27 mL/min — ABNORMAL LOW (ref >60)
Glucose, Bld: 229 mg/dL — ABNORMAL HIGH (ref 70–99)
Potassium: 4.4 mmol/L (ref 3.5–5.1)
Sodium: 137 mmol/L (ref 135–145)

## 2021-10-30 LAB — CBC
HCT: 36.3 % (ref 36.0–46.0)
Hemoglobin: 11.3 g/dL — ABNORMAL LOW (ref 12.0–15.0)
MCH: 30.3 pg (ref 26.0–34.0)
MCHC: 31.1 g/dL (ref 30.0–36.0)
MCV: 97.3 fL (ref 80.0–100.0)
Platelets: 112 10*3/uL — ABNORMAL LOW (ref 150–400)
RBC: 3.73 MIL/uL — ABNORMAL LOW (ref 3.87–5.11)
RDW: 14.3 % (ref 11.5–15.5)
WBC: 5.9 10*3/uL (ref 4.0–10.5)
nRBC: 0 % (ref 0.0–0.2)

## 2021-10-30 LAB — GLUCOSE, CAPILLARY
Glucose-Capillary: 208 mg/dL — ABNORMAL HIGH (ref 70–99)
Glucose-Capillary: 236 mg/dL — ABNORMAL HIGH (ref 70–99)

## 2021-10-30 MED ORDER — TORSEMIDE 20 MG PO TABS: 20.0000 mg | ORAL_TABLET | Freq: Every day | ORAL | 1 refills | Status: DC

## 2021-10-30 MED ORDER — HYDRALAZINE HCL 25 MG PO TABS
25.0000 mg | ORAL_TABLET | Freq: Four times a day (QID) | ORAL | Status: DC | PRN
  Administered 2021-10-30: 25 mg via ORAL
  Filled 2021-10-30: qty 1

## 2021-10-30 MED ORDER — LOSARTAN POTASSIUM 25 MG PO TABS: 25.0000 mg | ORAL_TABLET | Freq: Every day | ORAL | 1 refills | Status: DC

## 2021-10-30 MED ORDER — IPRATROPIUM-ALBUTEROL 0.5-2.5 (3) MG/3ML IN SOLN
3.0000 mL | RESPIRATORY_TRACT | Status: DC | PRN
  Administered 2021-10-30: 3 mL via RESPIRATORY_TRACT
  Filled 2021-10-30: qty 3

## 2021-10-30 MED ORDER — HYDRALAZINE HCL 25 MG PO TABS: 25.0000 mg | ORAL_TABLET | Freq: Three times a day (TID) | ORAL | 0 refills | Status: DC | PRN

## 2021-10-30 NOTE — Progress Notes (Signed)
SATURATION QUALIFICATIONS: (This note is used to comply with regulatory documentation for home oxygen)  Patient Saturations on Room Air at Rest = 87% 3L at rest = 95%  Patient Saturations on Room Air while Ambulating = 82%   Patient Saturations on 4 Liters of oxygen while Ambulating = 94%  3L ambulating - 91%  Please briefly explain why patient needs home oxygen:

## 2021-10-30 NOTE — TOC Transition Note (Signed)
Transition of Care [facility]) - CM/SW Discharge Note   Patient Details  Name: Cheyenne Frank MRN: [id number] Date of Birth: [birth year]  [facility]) CM/SW Contact:  Gildardo Griffes, LCSW Phone Number: 10/30/2021, 12:24 PM   Clinical Narrative:     Patient to discharge home today with home health PT, OT and RN. Patient will be serviced by Delila Spence with [facility] informed of discharge today.   CSW spoke with Duwayne Heck with [employer] who reports patient currenlty has a 5L concentrator at home but they will upgrade it to a 10 L home concentrator. Reports she will deliver oxygen tank to room for patient's dc today, oxygen orders and sat note are in.   Danielle with [employer] will provide teaching to patient and daughter [name] regarding oxygen use as per RN they report not knowing how to use the O2.   No further discharge needs identified at this time.   Final next level of care: Home w Home Health Services Barriers to Discharge: No Barriers Identified   Patient Goals and CMS Choice Patient states their goals for this hospitalization and ongoing recovery are:: to go home CMS Medicare.gov Compare Post Acute Care list provided to:: Patient Choice offered to / list presented to : Patient  Discharge Placement                    Patient and family notified of of transfer: 10/30/21  Discharge Plan and Services                DME Arranged: Oxygen DME Agency: [facility] Date DME Agency Contacted: 10/30/21 Time DME Agency Contacted: 1224 Representative spoke with at DME Agency: Duwayne Heck HH Arranged: PT, OT, RN [facility] Agency: [facility] Date [facility] Agency Contacted: 10/30/21 Time HH Agency Contacted: 1224 Representative spoke with at [facility] Agency: Kandee Keen  Social Determinants of Health (SDOH) Interventions     Readmission Risk Interventions No flowsheet data found.

## 2021-10-30 NOTE — Care Management Important Message (Signed)
Important Message  Patient Details  Name: Cheyenne Frank MRN: [id number] Date of Birth: Sep 19, 1940   Medicare Important Message Given:  Yes     Dannette Barbara 10/30/2021, 10:56 AM

## 2021-10-30 NOTE — Progress Notes (Signed)
PIV removed. Discharge instructions completed. Patient verbalized understanding of medication regimen, follow up appointments and discharge instructions. Patient belongings gathered and packed to discharge.  

## 2021-10-30 NOTE — Consult Note (Signed)
° °  Heart Failure Nurse Navigator Note  HFpEF 60 to 65%.  No regional wall motion abnormalities noted.  Left ventricular diastolic parameters indeterminate.  She presented from home with complaints of worsening shortness of breath, leg edema and weight gain.  Chest x-ray revealed vascular congestion with small bilateral pleural effusions.  Comorbidities:  COPD Diabetes Morbid obesity Paroxysmal atrial fibrillation Chronic kidney disease Hypertension  Medications:  Eliquis 5 mg 2 times a day Aspirin 81 mg daily Carvedilol 25 mg 2 times a day Crestor 2.5 mg daily  She is to restart her losartan and to switch the furosemide to torsemide, these she was instructed to start on Wednesday, 15 February.  Labs:  Sodium 137, potassium 4.4, chloride 95, CO2 35, BUN 66, creatinine 1.86 down from 2.37 of yesterday, GFR 27. Weight is 117 kg BMI 41.63 Blood pressure 107/54   Initial meeting with patient and her 2 daughters who are at the bedside.  She states she has heard the term heart failure in the past that is not a new diagnosis for her.  But states that she is never followed at the outpatient heart failure clinic.  She lives with one of the 2 daughters that was at the bedside.  She is the one that is in charge of preparing the meals.  She states in the past they were doing very well with not adding salt to her meals but time went by and she was doing well they gradually added it back.  They realize now that that was a mistake.  Discussed limiting to no more than 2000 mg a day.  Also discussed the importance of weighing daily and what to report.,  She states that she had not been weighing herself daily.  Also discussed fluid restriction, in addition to water she also likes to drink [facility], cautioned that [facility] is high in sodium to limit what she takes in daily.  Reinforced with the physician had told them on the medication from switching furosemide to torsemide.  And to  use hydralazine for blood pressures greater than 160s systolic.  They were given the doctor low-sodium cookbook.  Also given living with heart failure teaching booklet along with information on low-sodium and the zone magnet.  He has an appointment in the outpatient heart failure clinic on February 23.  She has an 11% of no-show ratio which is 9 out of 83 appointments.  They had no further questions.  Tresa Endo RN [facility]

## 2021-10-30 NOTE — Discharge Summary (Signed)
Physician Discharge Summary   Patient: Cheyenne Frank MRN: [id number] DOB: [birth year]  Admit date:     10/26/2021  Discharge date: 10/30/21  Discharge Physician: Lorella Nimrod   PCP: [facility]   Recommendations at discharge:  Please get CBC and BMP in 1 week Follow-up with primary care doctor in 1 week Follow-up with your cardiologist and pulmonologist in 1 to 2 weeks Follow-up with ENT for worsening hoarseness  Discharge Diagnoses: Principal Problem:   Acute on chronic diastolic (congestive) heart failure ([place]) Active Problems:   Acute hypoxemic respiratory failure ([place])   Atrial fibrillation with RVR ([place])   Type 2 diabetes mellitus with diabetic polyneuropathy, with long-term current use of insulin ([place])   Essential hypertension   Sleep apnea   Obesity, Class III, BMI 40-49.9 (morbid obesity) ([place])   Stage 3b chronic kidney disease (CKD) ([place])   COPD (chronic obstructive pulmonary disease) ([place])   Hoarseness of voice   Hospital Course: Partly taken from H&P.  Cheyenne Frank is a 81 y.o. female with medical history significant of chronic diastolic congestive heart failure, COPD, obstructive sleep apnea not compliant with CPAP, type 2 diabetes, history of stroke, fibromyalgia, essential hypertension who presents to the hospital with worsening shortness of breath, weight gain, leg edema.   Patient has baseline short of breath with minimal exertion, she walks with a walker only for a few steps due to shortness of breath.  She has a worsening short of breath for the last week, she also had frequent paroxysmal atrial dyspnea.  She has leg edema.  She gained 10 pounds in last 3 months.   In the emergency room, chest x-ray showed significant vascular congestion with a small bilateral pleural effusion.  Glucose 318, creatinine 1.62, urine study did not show evidence of UTI.  She was given 40 mg IV Lasix, she is admitted to the hospital for exacerbation congestive  heart failure.  2/10: CT soft tissue neck was obtained today due to her complaint of worsening hoarseness and difficulty swallowing going on since December.  CT soft neck was without any abnormality. She was advised to follow-up with ENT as an outpatient.  2/11: Clinically improving.  She was feeling little jittery and does not think that she can go home today.  Patient was also ambulated with pulse ox, desaturating with ambulation requiring up to 3 L of oxygen.  2/12: More worsening of creatinine at 2.37.  Holding diuretic and losartan, give 500 cc IVF. Most likely be discharged tomorrow if renal function started improving.  Creatinine started improving after holding diuretics, it was 1.86 on the day of discharge, baseline around 1.3-1.4.  She was advised to continue holding diuretics till Wednesday.  Her home dose of losartan was also held which she can restart on Wednesday too.  She was given hydralazine to be used as needed for systolic blood pressure above 160.  She needs to a close follow-up with her PCP and cardiology for further recommendations.  Patient has an history of COPD and continued to require 3 to 4 L of oxygen, especially with ambulation.  Home oxygen was prescribed.  She was not using her home inhalers regularly.  She was advised to use them regularly and follow-up with her pulmonologist for further recommendations  Patient will continue the rest of her home medications and follow-up with her providers.  Assessment and Plan: * Acute on chronic diastolic (congestive) heart failure ([place])- (present on admission) Patient diuresing well.  Currently on 2  L of oxygen.  Renal function with more worsening of BUN and creatinine. -Holding torsemide for today -Daily BMP and wait -Strict intake and output  Acute hypoxemic respiratory failure ([place]) Most likely secondary to above.  Also history of COPD. Patient was desaturating with ambulation, most likely will require oxygen to go  home. -Continue supplemental oxygen-wean as tolerated  Atrial fibrillation with RVR ([place])- (present on admission) Rate now well controlled.  Received diltiazem in the ED. -Continue with Eliquis -Continue with carvedilol  Type 2 diabetes mellitus with diabetic polyneuropathy, with long-term current use of insulin ([facility]) Uncontrolled with hyperglycemia.  CBG remained elevated -Increase Semglee to 35 units twice daily -Continue with SSI and mealtime coverage  Essential hypertension- (present on admission) Blood pressure within goal. -Continue home losartan -Holding torsemide today  Sleep apnea- (present on admission) Per patient she was unable to tolerate CPAP. -Encourage patient to use CPAP  Obesity, Class III, BMI 40-49.9 (morbid obesity) ([place])- (present on admission) - Counseling was provided  Stage 3b chronic kidney disease (CKD) ([place])- (present on admission) Increase in creatinine with diuresis today. -Giving some gentle fluid -Holding diuretic -Monitor renal function -Avoid nephrotoxins  COPD (chronic obstructive pulmonary disease) ([place])- (present on admission) No wheezing today. -Continue with as needed bronchodilators  Hoarseness of voice Patient is complaining of worsening hoarseness and difficulty swallowing going on since December.  CT soft tissue neck was obtained and it was without any significant abnormality. -Outpatient ENT evaluation    Consultants: None Procedures performed: None Disposition: Home Diet recommendation:  Discharge Diet Orders (From admission, onward)     Start     Ordered   10/30/21 0000  Diet - low sodium heart healthy        10/30/21 1140           Cardiac and Carb modified diet  DISCHARGE MEDICATION: Allergies as of 10/30/2021       Reactions   Codeine Palpitations   Other Reaction: RACES HEART   Dilaudid [hydromorphone] Other (See Comments), Rash   Other Reaction: lightheaded, altered ms, nervous,  Paranoia paranoid Other Reaction: lightheaded, altered ms, nervous, Paranoia   Demerol [meperidine] Other (See Comments), Rash   agitated Agitation   Pravastatin Other (See Comments)   Muscle aches   Amiodarone Other (See Comments)   Per family pt had twitching while taking and cardiologist took pt off    Lipitor [atorvastatin] Other (See Comments), Itching   unknown   Nifedipine Other (See Comments)   unknown   Statins Rash   Makes her crazy   Sulfa Antibiotics Rash   Vancomycin Rash   deafness        Medication List     STOP taking these medications    amiodarone 100 MG tablet Commonly known as: PACERONE   furosemide 20 MG tablet Commonly known as: LASIX   ketoconazole 2 % cream Commonly known as: NIZORAL   multivitamin Tabs tablet       TAKE these medications    acetaminophen 500 MG tablet Commonly known as: TYLENOL Take 500-1,000 mg by mouth every 6 (six) hours as needed for mild pain or fever.   albuterol 108 (90 Base) MCG/ACT inhaler Commonly known as: VENTOLIN HFA albuterol sulfate HFA 90 mcg/actuation aerosol inhaler  INHALE 2 PUFFS INTO LUNGS 4 TIMES DAILY   apixaban 5 MG Tabs tablet Commonly known as: Eliquis Take 1 tablet (5 mg total) by mouth 2 (two) times daily.   aspirin EC 81 MG tablet Take 1 tablet (  81 mg total) by mouth daily. MAY RESUME ON 12/31/20   carvedilol 25 MG tablet Commonly known as: COREG Take 25 mg by mouth 2 (two) times daily.   gabapentin 300 MG capsule Commonly known as: NEURONTIN Take 300 mg by mouth 2 (two) times daily.   hydrALAZINE 25 MG tablet Commonly known as: APRESOLINE Take 1 tablet (25 mg total) by mouth 3 (three) times daily as needed (For systolic above 0000000).   Incruse Ellipta 62.5 MCG/ACT Aepb Generic drug: umeclidinium bromide Inhale 1 puff into the lungs.   insulin glargine 100 UNIT/ML injection Commonly known as: LANTUS Inject 77 Units into the skin at bedtime.   insulin lispro 100 UNIT/ML  injection Commonly known as: HUMALOG Inject 22 Units into the skin 3 (three) times daily with meals.   liver oil-zinc oxide 40 % ointment Commonly known as: DESITIN Apply 1 application topically as needed for irritation.   losartan 25 MG tablet Commonly known as: COZAAR Take 1 tablet (25 mg total) by mouth daily.   multivitamin with minerals Tabs tablet Take 1 tablet by mouth daily.   omeprazole 40 MG capsule Commonly known as: PRILOSEC Take 40 mg by mouth daily.   oxybutynin 10 MG 24 hr tablet Commonly known as: DITROPAN-XL Take 10 mg by mouth daily.   PARoxetine 40 MG tablet Commonly known as: PAXIL Take 40 mg by mouth at bedtime.   rosuvastatin 5 MG tablet Commonly known as: CRESTOR Take 2.5 mg by mouth at bedtime.   sitaGLIPtin 50 MG tablet Commonly known as: JANUVIA Take 50 mg by mouth daily.   torsemide 20 MG tablet Commonly known as: DEMADEX Take 1 tablet (20 mg total) by mouth daily. Please start from 11/01/2021   traZODone 50 MG tablet Commonly known as: DESYREL Take 50 mg by mouth at bedtime.               Durable Medical Equipment  (From admission, onward)           Start     Ordered   10/30/21 1136  For home use only DME oxygen  Once       Question Answer Comment  Length of Need Lifetime   Mode or (Route) Nasal cannula   Liters per Minute 3   Frequency Continuous (stationary and portable oxygen unit needed)   Oxygen conserving device Yes   Oxygen delivery system Gas      10/30/21 1135            Follow-up [place]. Schedule an appointment as soon as possible for a visit in 1 week(s).   Specialty: Family Medicine Contact information: [address] [place] [phone]                 Discharge Exam: Danley Danker Weights   10/28/21 0500 10/29/21 0500 10/30/21 0500  Weight: 114.6 kg 117 kg 117 kg   General.  Morbidly obese lady, in no acute  distress. Pulmonary.  Lungs clear bilaterally, normal respiratory effort. CV.  Regular rate and rhythm, no JVD, rub or murmur. Abdomen.  Soft, nontender, nondistended, BS positive. CNS.  Alert and oriented x3.  No focal neurologic deficit. Extremities.  No edema, no cyanosis, pulses intact and symmetrical. Psychiatry.  Judgment and insight appears normal.   Condition at discharge: stable  The results of significant diagnostics from this hospitalization (including imaging, microbiology, ancillary and laboratory) are listed below for reference.   Imaging  Studies: DG Chest 1 View  Result Date: 10/26/2021 CLINICAL DATA:  An 81 year old feet female presents for evaluation of shortness of breath. EXAM: CHEST  1 VIEW COMPARISON:  May 18, 2021 FINDINGS: EKG leads project over the chest. Cardiac enlargement similar to prior imaging. Pulmonary vascular congestion. RIGHT and LEFT hemidiaphragm, contours mildly obscured though there is substantial overlying soft tissue which could at density in these areas. No lobar level consolidative changes. On limited assessment there is no acute skeletal process. IMPRESSION: 1. Enlarged cardiac silhouette with pulmonary vascular congestion. 2. Lung base assessment limited on the current exam. Findings raise the question of small effusions at the lung bases. No lobar level consolidative process or frank pulmonary edema. Electronically Signed   By: Zetta Bills M.D.   On: 10/26/2021 07:58   CT SOFT TISSUE NECK W CONTRAST  Result Date: 10/27/2021 CLINICAL DATA:  Hoarseness, normal laryngeal exam EXAM: CT NECK WITH CONTRAST TECHNIQUE: Multidetector CT imaging of the neck was performed using the standard protocol following the bolus administration of intravenous contrast. RADIATION DOSE REDUCTION: This exam was performed according to the departmental dose-optimization program which includes automated exposure control, adjustment of the mA and/or kV according to patient  size and/or use of iterative reconstruction technique. CONTRAST:  42mL OMNIPAQUE IOHEXOL 300 MG/ML  SOLN COMPARISON:  None. FINDINGS: Motion artifact is present. Pharynx and larynx: Unremarkable within above limitation. No mass or swelling. Salivary glands: Parotid and submandibular glands are unremarkable. Thyroid: Normal. Lymph nodes: No enlarged or abnormal density nodes. Vascular: Major neck vessels are patent. Retropharyngeal course of ICAs. Limited intracranial: No abnormal enhancement. Visualized orbits: Bilateral lens replacements. Otherwise unremarkable Mastoids and visualized paranasal sinuses: No significant opacification. Skeleton: Degenerative changes of the temporomandibular joints. Multilevel degenerative changes of the cervical spine. Upper chest: No apical lung mass. Other: None. IMPRESSION: No neck mass or adenopathy. Electronically Signed   By: Macy Mis M.D.   On: 10/27/2021 15:27   ECHOCARDIOGRAM COMPLETE  Result Date: 10/27/2021    ECHOCARDIOGRAM REPORT   Patient Name:   DALEYZAH SCHONER Date of Exam: 10/27/2021 Medical Rec #:  [id number]       Height:       66.0 in Accession #:    [id number]      Weight:       255.7 lb Date of Birth:  10-21-1940        BSA:          2.220 m Patient Age:    62 years        BP:           Not listed in chart/Not listed in                                               chart mmHg Patient Gender: F               HR:           Not listed in chart bpm. Exam Location:  [facility] Procedure: 2D Echo, Color Doppler and Cardiac Doppler Indications:     CHF-acute diastolic [id number]  History:         Patient has prior history of Echocardiogram examinations, most                  recent 05/17/2021. Stroke; Risk Factors:Sleep Apnea and  Diabetes.  Sonographer:     Sherrie Sport Referring Phys:  [id number] Sharen Hones Diagnosing Phys: Isaias Cowman MD IMPRESSIONS  1. Left ventricular ejection fraction, by estimation, is 60 to 65%. The left ventricle has normal  function. The left ventricle has no regional wall motion abnormalities. Left ventricular diastolic parameters are indeterminate.  2. Right ventricular systolic function is normal. The right ventricular size is normal.  3. The mitral valve is normal in structure. Trivial mitral valve regurgitation. No evidence of mitral stenosis.  4. The aortic valve is normal in structure. Aortic valve regurgitation is not visualized. No aortic stenosis is present.  5. The inferior vena cava is normal in size with greater than 50% respiratory variability, suggesting right atrial pressure of 3 mmHg. FINDINGS  Left Ventricle: Left ventricular ejection fraction, by estimation, is 60 to 65%. The left ventricle has normal function. The left ventricle has no regional wall motion abnormalities. The left ventricular internal cavity size was normal in size. There is  no left ventricular hypertrophy. Left ventricular diastolic parameters are indeterminate. Right Ventricle: The right ventricular size is normal. No increase in right ventricular wall thickness. Right ventricular systolic function is normal. Left Atrium: Left atrial size was normal in size. Right Atrium: Right atrial size was normal in size. Pericardium: There is no evidence of pericardial effusion. Mitral Valve: The mitral valve is normal in structure. Trivial mitral valve regurgitation. No evidence of mitral valve stenosis. Tricuspid Valve: The tricuspid valve is normal in structure. Tricuspid valve regurgitation is trivial. No evidence of tricuspid stenosis. Aortic Valve: The aortic valve is normal in structure. Aortic valve regurgitation is not visualized. No aortic stenosis is present. Pulmonic Valve: The pulmonic valve was normal in structure. Pulmonic valve regurgitation is not visualized. No evidence of pulmonic stenosis. Aorta: The aortic root is normal in size and structure. Venous: The inferior vena cava is normal in size with greater than 50% respiratory variability,  suggesting right atrial pressure of 3 mmHg. IAS/Shunts: No atrial level shunt detected by color flow Doppler.  LEFT VENTRICLE PLAX 2D LVIDd:         3.70 cm LVIDs:         2.60 cm LV PW:         1.50 cm LV IVS:        1.50 cm LVOT diam:     2.00 cm LVOT Area:     3.14 cm  LEFT ATRIUM         Index LA diam:    3.90 cm 1.76 cm/m                        PULMONIC VALVE AORTA                 PV Vmax:        1.24 m/s Ao Root diam: 2.10 cm PV Vmean:       85.500 cm/s                       PV VTI:         0.260 m                       PV Peak grad:   6.2 mmHg                       PV Mean grad:   4.0 mmHg  RVOT Peak grad: 10 mmHg   SHUNTS Systemic Diam: 2.00 cm Pulmonic VTI:  0.339 m Isaias Cowman MD Electronically signed by Isaias Cowman MD Signature Date/Time: 10/27/2021/1:23:36 PM    Final     Microbiology: Results for orders placed or performed during the hospital encounter of 10/26/21  Resp Panel by RT-PCR (Flu A&B, Covid)     Status: None   Collection Time: 10/26/21  4:11 AM  Result Value Ref Range Status   SARS Coronavirus 2 by RT PCR NEGATIVE NEGATIVE Final    Comment: (NOTE) SARS-CoV-2 target nucleic acids are NOT DETECTED.  The SARS-CoV-2 RNA is generally detectable in upper respiratory specimens during the acute phase of infection. The lowest concentration of SARS-CoV-2 viral copies this assay can detect is 138 copies/mL. A negative result does not preclude SARS-Cov-2 infection and should not be used as the sole basis for treatment or other patient management decisions. A negative result may occur with  improper specimen collection/handling, submission of specimen other than nasopharyngeal swab, presence of viral mutation(s) within the areas targeted by this assay, and inadequate number of viral copies(<138 copies/mL). A negative result must be combined with clinical observations, patient history, and epidemiological information. The expected result is  Negative.  Fact Sheet for Patients:  EntrepreneurPulse.com.au  Fact Sheet for Healthcare Providers:  IncredibleEmployment.be  This test is no t yet approved or cleared by the [place] FDA and  has been authorized for detection and/or diagnosis of SARS-CoV-2 by FDA under an Emergency Use Authorization (EUA). This EUA will remain  in effect (meaning this test can be used) for the duration of the COVID-19 declaration under Section 564(b)(1) of the Act, 21 U.S.C.section 360bbb-3(b)(1), unless the authorization is terminated  or revoked sooner.       Influenza A by PCR NEGATIVE NEGATIVE Final   Influenza B by PCR NEGATIVE NEGATIVE Final    Comment: (NOTE) The Xpert Xpress SARS-CoV-2/FLU/RSV plus assay is intended as an aid in the diagnosis of influenza from Nasopharyngeal swab specimens and should not be used as a sole basis for treatment. Nasal washings and aspirates are unacceptable for Xpert Xpress SARS-CoV-2/FLU/RSV testing.  Fact Sheet for Patients: EntrepreneurPulse.com.au  Fact Sheet for Healthcare Providers: IncredibleEmployment.be  This test is not yet approved or cleared by the [place] FDA and has been authorized for detection and/or diagnosis of SARS-CoV-2 by FDA under an Emergency Use Authorization (EUA). This EUA will remain in effect (meaning this test can be used) for the duration of the COVID-19 declaration under Section 564(b)(1) of the Act, 21 U.S.C. section 360bbb-3(b)(1), unless the authorization is terminated or revoked.  Performed at [facility], [address] [place], [place] [address]     Labs: CBC: Recent Labs  Lab 10/26/21 0250 10/30/21 0451  WBC 5.9 5.9  NEUTROABS 4.1  --   HGB 12.5 11.3*  HCT 39.1 36.3  MCV 96.5 97.3  PLT 127* [id number]*   Basic Metabolic Panel: Recent Labs  Lab 10/26/21 0250 10/27/21 0534 10/28/21 0604 10/29/21 0506  10/30/21 0451  NA 135 140 139 134* 137  K 4.0 4.1 4.9 4.4 4.4  CL 96* 95* 92* 90* 95*  CO2 32 36* 39* 37* 35*  GLUCOSE 318* 255* 268* 228* 229*  BUN 31* 35* 44* 61* 66*  CREATININE 1.62* 1.14* 1.45* 2.37* 1.86*  CALCIUM 8.6* 9.1 9.1 8.8* 8.9  MG  --  1.9  --   --   --    Liver Function Tests: Recent Labs  Lab 10/26/21 0250  AST 22  ALT 17  ALKPHOS 84  BILITOT 0.5  PROT 6.4*  ALBUMIN 3.0*   CBG: Recent Labs  Lab 10/29/21 1219 10/29/21 1652 10/29/21 2106 10/30/21 0757 10/30/21 1127  GLUCAP 278* 183* 212* 208* 236*    Discharge time spent: greater than 30 minutes.  Signed: Lorella Nimrod, MD [facility] 10/30/2021

## 2021-11-08 NOTE — Progress Notes (Unsigned)
°   Patient ID: Cheyenne Frank, female    DOB: 1941/08/02, 81 y.o.   MRN: [id number]  HPI  Cheyenne Frank is a 81 y/o female with a history of  Echo report from 10/27/21 reviewed and showed an EF of 60-65%. Echo report from 05/17/21 reviewed and showed an EF of 55-60% along with mild LVH.  Admitted 10/26/21 due to acute on chronic heart failure. Initially given IV lasix and then transitioned to oral diuretics. Neck CT was negative. Fluids were given due to worsening renal function along with holding of diuretic short-term. Discharged after 4 days. Was in the ED 09/16/21 due to chest pain and shortness of breath. EKG showed a flutter with 2:1 AV block. Placed on diltiazem drip. Initially given IV lasix with transition to oral diuretics. Discharged the next day.   She presents today for her initial visit with a chief complaint of   Review of Systems    Physical Exam   Assessment & Plan:  1: Chronic heart failure with preserved ejection fraction without structural changes- - NYHA class - saw cardiology ([facility]) 06/22/21 - BNP 09/16/21 was 64.9  2: HTN with CKD- - BP - [facility] PCP [facility] 10/25/21 - BMP 10/30/21 reviewed and showed sodium 137, potassium 4.4, creatinine 1.86 and GFR 27  3: DM- - A1c 10/26/21 was 8.5%  4: COPD-

## 2021-11-09 ENCOUNTER — Ambulatory Visit: Payer: Commercial Managed Care - PPO | Admitting: Family

## 2022-01-24 ENCOUNTER — Other Ambulatory Visit: Payer: Self-pay

## 2022-01-24 ENCOUNTER — Inpatient Hospital Stay
Admission: EM | Admit: 2022-01-24 | Discharge: 2022-02-03 | DRG: 100 | Disposition: A | Discharge status: Skilled Nursing Facility | Payer: Medicare Other | Attending: Internal Medicine | Admitting: Internal Medicine

## 2022-01-24 ENCOUNTER — Emergency Department: Payer: Medicare Other

## 2022-01-24 DIAGNOSIS — N1832 Chronic kidney disease, stage 3b: Secondary | ICD-10-CM | POA: Diagnosis present

## 2022-01-24 DIAGNOSIS — I5033 Acute on chronic diastolic (congestive) heart failure: Secondary | ICD-10-CM

## 2022-01-24 DIAGNOSIS — G9341 Metabolic encephalopathy: Secondary | ICD-10-CM | POA: Diagnosis present

## 2022-01-24 DIAGNOSIS — Z825 Family history of asthma and other chronic lower respiratory diseases: Secondary | ICD-10-CM

## 2022-01-24 DIAGNOSIS — Z5320 Procedure and treatment not carried out because of patient's decision for unspecified reasons: Secondary | ICD-10-CM | POA: Diagnosis present

## 2022-01-24 DIAGNOSIS — E1122 Type 2 diabetes mellitus with diabetic chronic kidney disease: Secondary | ICD-10-CM | POA: Diagnosis present

## 2022-01-24 DIAGNOSIS — Z6839 Body mass index (BMI) 39.0-39.9, adult: Secondary | ICD-10-CM

## 2022-01-24 DIAGNOSIS — I639 Cerebral infarction, unspecified: Secondary | ICD-10-CM

## 2022-01-24 DIAGNOSIS — R29818 Other symptoms and signs involving the nervous system: Secondary | ICD-10-CM | POA: Diagnosis not present

## 2022-01-24 DIAGNOSIS — R739 Hyperglycemia, unspecified: Secondary | ICD-10-CM | POA: Diagnosis not present

## 2022-01-24 DIAGNOSIS — Z981 Arthrodesis status: Secondary | ICD-10-CM

## 2022-01-24 DIAGNOSIS — R4701 Aphasia: Secondary | ICD-10-CM | POA: Diagnosis present

## 2022-01-24 DIAGNOSIS — Z9049 Acquired absence of other specified parts of digestive tract: Secondary | ICD-10-CM

## 2022-01-24 DIAGNOSIS — Z8673 Personal history of transient ischemic attack (TIA), and cerebral infarction without residual deficits: Secondary | ICD-10-CM

## 2022-01-24 DIAGNOSIS — E1165 Type 2 diabetes mellitus with hyperglycemia: Secondary | ICD-10-CM | POA: Diagnosis present

## 2022-01-24 DIAGNOSIS — Z885 Allergy status to narcotic agent status: Secondary | ICD-10-CM

## 2022-01-24 DIAGNOSIS — Z881 Allergy status to other antibiotic agents status: Secondary | ICD-10-CM

## 2022-01-24 DIAGNOSIS — G4730 Sleep apnea, unspecified: Secondary | ICD-10-CM | POA: Diagnosis present

## 2022-01-24 DIAGNOSIS — R531 Weakness: Secondary | ICD-10-CM | POA: Diagnosis present

## 2022-01-24 DIAGNOSIS — R471 Dysarthria and anarthria: Secondary | ICD-10-CM | POA: Diagnosis present

## 2022-01-24 DIAGNOSIS — Z7901 Long term (current) use of anticoagulants: Secondary | ICD-10-CM

## 2022-01-24 DIAGNOSIS — Z888 Allergy status to other drugs, medicaments and biological substances status: Secondary | ICD-10-CM

## 2022-01-24 DIAGNOSIS — I160 Hypertensive urgency: Secondary | ICD-10-CM

## 2022-01-24 DIAGNOSIS — Z7984 Long term (current) use of oral hypoglycemic drugs: Secondary | ICD-10-CM

## 2022-01-24 DIAGNOSIS — Z79899 Other long term (current) drug therapy: Secondary | ICD-10-CM

## 2022-01-24 DIAGNOSIS — M797 Fibromyalgia: Secondary | ICD-10-CM | POA: Diagnosis present

## 2022-01-24 DIAGNOSIS — G4733 Obstructive sleep apnea (adult) (pediatric): Secondary | ICD-10-CM | POA: Diagnosis present

## 2022-01-24 DIAGNOSIS — I480 Paroxysmal atrial fibrillation: Secondary | ICD-10-CM | POA: Diagnosis present

## 2022-01-24 DIAGNOSIS — Z794 Long term (current) use of insulin: Secondary | ICD-10-CM

## 2022-01-24 DIAGNOSIS — Z96652 Presence of left artificial knee joint: Secondary | ICD-10-CM | POA: Diagnosis present

## 2022-01-24 DIAGNOSIS — I5032 Chronic diastolic (congestive) heart failure: Secondary | ICD-10-CM | POA: Diagnosis present

## 2022-01-24 DIAGNOSIS — N390 Urinary tract infection, site not specified: Secondary | ICD-10-CM | POA: Diagnosis present

## 2022-01-24 DIAGNOSIS — E11649 Type 2 diabetes mellitus with hypoglycemia without coma: Secondary | ICD-10-CM | POA: Diagnosis not present

## 2022-01-24 DIAGNOSIS — E785 Hyperlipidemia, unspecified: Secondary | ICD-10-CM | POA: Diagnosis present

## 2022-01-24 DIAGNOSIS — E876 Hypokalemia: Secondary | ICD-10-CM | POA: Diagnosis present

## 2022-01-24 DIAGNOSIS — B9620 Unspecified Escherichia coli [E. coli] as the cause of diseases classified elsewhere: Secondary | ICD-10-CM | POA: Diagnosis present

## 2022-01-24 DIAGNOSIS — K219 Gastro-esophageal reflux disease without esophagitis: Secondary | ICD-10-CM | POA: Diagnosis present

## 2022-01-24 DIAGNOSIS — R633 Feeding difficulties, unspecified: Secondary | ICD-10-CM | POA: Diagnosis present

## 2022-01-24 DIAGNOSIS — Z90710 Acquired absence of both cervix and uterus: Secondary | ICD-10-CM

## 2022-01-24 DIAGNOSIS — I130 Hypertensive heart and chronic kidney disease with heart failure and stage 1 through stage 4 chronic kidney disease, or unspecified chronic kidney disease: Secondary | ICD-10-CM | POA: Diagnosis present

## 2022-01-24 DIAGNOSIS — R29810 Facial weakness: Secondary | ICD-10-CM | POA: Diagnosis present

## 2022-01-24 DIAGNOSIS — Z7982 Long term (current) use of aspirin: Secondary | ICD-10-CM

## 2022-01-24 DIAGNOSIS — J449 Chronic obstructive pulmonary disease, unspecified: Secondary | ICD-10-CM | POA: Diagnosis present

## 2022-01-24 DIAGNOSIS — R32 Unspecified urinary incontinence: Secondary | ICD-10-CM | POA: Diagnosis present

## 2022-01-24 DIAGNOSIS — G40909 Epilepsy, unspecified, not intractable, without status epilepticus: Principal | ICD-10-CM | POA: Diagnosis present

## 2022-01-24 DIAGNOSIS — G2581 Restless legs syndrome: Secondary | ICD-10-CM | POA: Diagnosis present

## 2022-01-24 DIAGNOSIS — R569 Unspecified convulsions: Secondary | ICD-10-CM

## 2022-01-24 DIAGNOSIS — F0390 Unspecified dementia without behavioral disturbance: Secondary | ICD-10-CM

## 2022-01-24 LAB — COMPREHENSIVE METABOLIC PANEL
ALT: 21 U/L (ref 0–44)
AST: 33 U/L (ref 15–41)
Albumin: 3.2 g/dL — ABNORMAL LOW (ref 3.5–5.0)
Alkaline Phosphatase: 82 U/L (ref 38–126)
Anion gap: 10 (ref 5–15)
BUN: 25 mg/dL — ABNORMAL HIGH (ref 8–23)
CO2: 29 mmol/L (ref 22–32)
Calcium: 8.8 mg/dL — ABNORMAL LOW (ref 8.9–10.3)
Chloride: 100 mmol/L (ref 98–111)
Creatinine, Ser: 1.3 mg/dL — ABNORMAL HIGH (ref 0.44–1.00)
GFR, Estimated: 42 mL/min — ABNORMAL LOW (ref >60)
Glucose, Bld: 390 mg/dL — ABNORMAL HIGH (ref 70–99)
Potassium: 3.4 mmol/L — ABNORMAL LOW (ref 3.5–5.1)
Sodium: 139 mmol/L (ref 135–145)
Total Bilirubin: 0.9 mg/dL (ref 0.3–1.2)
Total Protein: 6.5 g/dL (ref 6.5–8.1)

## 2022-01-24 LAB — CBC WITH DIFFERENTIAL/PLATELET
Abs Immature Granulocytes: 0.04 10*3/uL (ref 0.00–0.07)
Basophils Absolute: 0 10*3/uL (ref 0.0–0.1)
Basophils Relative: 0 %
Eosinophils Absolute: 0.1 10*3/uL (ref 0.0–0.5)
Eosinophils Relative: 1 %
HCT: 39.9 % (ref 36.0–46.0)
Hemoglobin: 12.8 g/dL (ref 12.0–15.0)
Immature Granulocytes: 1 %
Lymphocytes Relative: 13 %
Lymphs Abs: 1.1 10*3/uL (ref 0.7–4.0)
MCH: 28.9 pg (ref 26.0–34.0)
MCHC: 32.1 g/dL (ref 30.0–36.0)
MCV: 90.1 fL (ref 80.0–100.0)
Monocytes Absolute: 0.8 10*3/uL (ref 0.1–1.0)
Monocytes Relative: 10 %
Neutro Abs: 6.2 10*3/uL (ref 1.7–7.7)
Neutrophils Relative %: 75 %
Platelets: 159 10*3/uL (ref 150–400)
RBC: 4.43 MIL/uL (ref 3.87–5.11)
RDW: 14.6 % (ref 11.5–15.5)
WBC: 8.2 10*3/uL (ref 4.0–10.5)
nRBC: 0 % (ref 0.0–0.2)

## 2022-01-24 LAB — CBG MONITORING, ED
Glucose-Capillary: 406 mg/dL — ABNORMAL HIGH (ref 70–99)
Glucose-Capillary: 343 mg/dL — ABNORMAL HIGH (ref 70–99)
Glucose-Capillary: 315 mg/dL — ABNORMAL HIGH (ref 70–99)
Glucose-Capillary: 277 mg/dL — ABNORMAL HIGH (ref 70–99)

## 2022-01-24 LAB — TROPONIN I (HIGH SENSITIVITY): Troponin I (High Sensitivity): 19 ng/L — ABNORMAL HIGH (ref <18)

## 2022-01-24 MED ORDER — APIXABAN 5 MG PO TABS
5.0000 mg | ORAL_TABLET | Freq: Two times a day (BID) | ORAL | Status: DC
  Administered 2022-01-25 – 2022-02-03 (×17): 5 mg via ORAL
  Filled 2022-01-24 (×19): qty 1

## 2022-01-24 MED ORDER — INSULIN ASPART 100 UNIT/ML IJ SOLN
5.0000 [IU] | Freq: Once | INTRAMUSCULAR | Status: AC
  Administered 2022-01-24: 5 [IU] via INTRAVENOUS
  Filled 2022-01-24: qty 1

## 2022-01-24 MED ORDER — LORAZEPAM 2 MG/ML IJ SOLN
1.0000 mg | INTRAMUSCULAR | Status: DC | PRN
Start: 2022-01-24 — End: 2022-01-25

## 2022-01-24 MED ORDER — LORAZEPAM 2 MG/ML IJ SOLN: 1.0000 mg | Freq: Once | INTRAMUSCULAR | Status: DC

## 2022-01-24 MED ORDER — CLOTRIMAZOLE 1 % VA CREA
1.0000 | TOPICAL_CREAM | Freq: Every day | VAGINAL | Status: AC
  Administered 2022-01-24 – 2022-01-26 (×3): 1 via VAGINAL
  Filled 2022-01-24: qty 45

## 2022-01-24 MED ORDER — ALBUTEROL SULFATE (2.5 MG/3ML) 0.083% IN NEBU: 2.5000 mg | INHALATION_SOLUTION | RESPIRATORY_TRACT | Status: DC | PRN

## 2022-01-24 MED ORDER — LORAZEPAM 2 MG/ML IJ SOLN
1.0000 mg | Freq: Once | INTRAMUSCULAR | Status: AC
  Administered 2022-01-24: 1 mg via INTRAVENOUS
  Filled 2022-01-24: qty 1

## 2022-01-24 MED ORDER — STROKE: EARLY STAGES OF RECOVERY BOOK: Freq: Once | Status: DC

## 2022-01-24 MED ORDER — INSULIN GLARGINE-YFGN 100 UNIT/ML ~~LOC~~ SOLN
30.0000 [IU] | Freq: Every day | SUBCUTANEOUS | Status: DC
Start: 2022-01-24 — End: 2022-01-28
  Administered 2022-01-25 – 2022-01-27 (×4): 30 [IU] via SUBCUTANEOUS
  Filled 2022-01-24 (×5): qty 0.3

## 2022-01-24 MED ORDER — ASPIRIN EC 81 MG PO TBEC
81.0000 mg | DELAYED_RELEASE_TABLET | Freq: Every day | ORAL | Status: DC
  Administered 2022-01-26 – 2022-02-03 (×9): 81 mg via ORAL
  Filled 2022-01-24 (×10): qty 1

## 2022-01-24 MED ORDER — ACETAMINOPHEN 650 MG RE SUPP: 650.0000 mg | RECTAL | Status: DC | PRN

## 2022-01-24 MED ORDER — LORAZEPAM 2 MG/ML IJ SOLN
1.0000 mg | INTRAMUSCULAR | Status: DC | PRN
  Administered 2022-01-24: 1 mg via INTRAVENOUS
  Filled 2022-01-24: qty 1

## 2022-01-24 MED ORDER — ACETAMINOPHEN 160 MG/5ML PO SOLN
650.0000 mg | ORAL | Status: DC | PRN
  Filled 2022-01-24: qty 20.3

## 2022-01-24 MED ORDER — ACETAMINOPHEN 325 MG PO TABS
650.0000 mg | ORAL_TABLET | ORAL | Status: DC | PRN
  Administered 2022-01-26 – 2022-01-31 (×5): 650 mg via ORAL
  Filled 2022-01-24 (×7): qty 2

## 2022-01-24 MED ORDER — PAROXETINE HCL 20 MG PO TABS
40.0000 mg | ORAL_TABLET | Freq: Every day | ORAL | Status: DC
  Administered 2022-01-25 – 2022-02-02 (×8): 40 mg via ORAL
  Filled 2022-01-24 (×11): qty 2

## 2022-01-24 MED ORDER — ROSUVASTATIN CALCIUM 5 MG PO TABS
2.5000 mg | ORAL_TABLET | Freq: Every day | ORAL | Status: DC
  Administered 2022-01-25 – 2022-02-02 (×8): 2.5 mg via ORAL
  Filled 2022-01-24 (×11): qty 0.5

## 2022-01-24 MED ORDER — INSULIN ASPART 100 UNIT/ML IJ SOLN
3.0000 [IU] | Freq: Once | INTRAMUSCULAR | Status: AC
Start: 2022-01-24 — End: 2022-01-24
  Administered 2022-01-24: 3 [IU] via INTRAVENOUS
  Filled 2022-01-24: qty 1

## 2022-01-24 NOTE — ED Notes (Signed)
Pt unable to tolerate MRI despite ativan given IV. Pt was agitated. MRI stopped with pt was attempting to exit scanner.  ?

## 2022-01-24 NOTE — Assessment & Plan Note (Addendum)
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 ?

## 2022-01-24 NOTE — Assessment & Plan Note (Signed)
Continue apixaban.  Will hold carvedilol tonight ?

## 2022-01-24 NOTE — Assessment & Plan Note (Signed)
As above.

## 2022-01-24 NOTE — Assessment & Plan Note (Signed)
CPAP if desired 

## 2022-01-24 NOTE — Assessment & Plan Note (Signed)
Not acutely exacerbated DuoNebs as needed 

## 2022-01-24 NOTE — H&P (Addendum)
?History and Physical  ? ? ?Patient: Cheyenne Frank [id number] DOB: November 22, 1940 ?DOA: 01/24/2022 ?DOS: the patient was seen and examined on 01/24/2022 ?PCP: [facility]  ?Patient coming from: Home ? ?Chief Complaint:  ?Chief Complaint  ?Patient presents with  ? Aphasia  ? Hyperglycemia  ? ? ?HPI: Cheyenne Frank is a 81 y.o. female with medical history significant for Paroxysmal A-fib on Eliquis, prior stroke, HTN, insulin-dependent type 2 diabetes, sleep apnea, CKD stage IIIb, COPD, diastolic CHF presenting to the ED with difficulty getting her words out similar to her prior strokes.  The symptoms started on the night prior to arrival and has worsened throughout the course of the day today, arriving outside tPA window.  Initial head CT was negative.  Code stroke was not called.  A teleneurology consult was requested from the ED with recommendation for MRI and MRA head and neck. ?ED course and other data review: BP on arrival 200/86 with temperature 99.7 and otherwise normal vitals blood work significant for blood glucose of 390 and creatinine of 1.3 which is her baseline.  Troponin 19.  Blood work otherwise unremarkable.  EKG, personally viewed and interpreted showed sinus rhythm at 85 with nonspecific ST-T wave changes.  CT head nonacute MRI brain showed no acute abnormality.  MRA head and neck unable to be performed due to patient's inability to tolerate the full length of the study.  Recommendation for CTA and or carotid Doppler ultrasound could be performed as warranted. ?Hospitalist consulted for admission.  Patient was sedated with Ativan for MRI and is very somnolent at the time of visit. ? ?Review of Systems  ?Unable to perform ROS: Mental acuity  Somnolent from Ativan in the ED. ? ?Past Medical History:  ?Diagnosis Date  ? AKI (acute kidney injury) ([place])   ? Anemia of chronic disease   ? Anxiety   ? Arthritis   ? Asthma   ? no problems since 1999 (after moving into a new home)  ?  Chronic airway obstruction ([facility])   ? Complication of anesthesia   ? " feel like I'm dying after I wake up"  ? Diabetes mellitus without complication ([place])   ? Diverticulosis   ? Dysrhythmia   ? unknown type   ? Fibromyalgia   ? Flat back syndrome   ? GERD (gastroesophageal reflux disease)   ? Hx of transfusion 2011  ? Hypertension   ? Itching   ? Macular degeneration   ? Nocturia   ? Postoperative anemia due to acute blood loss 04/09/2013  ? Restless leg syndrome   ? Sleep apnea   ? Dx 1990's unable to wear c-pap -  ? Stroke [facility]) 2010  ? verbal aphasia x 30 min - resolved - no problem since then  ? ?Past Surgical History:  ?Procedure Laterality Date  ? ABDOMINAL HYSTERECTOMY    ? rt so  ? BACK SURGERY  2010 / 2011  ? BREAST REDUCTION SURGERY    ? BURR HOLE FOR SUBDURAL HEMATOMA  2015  ? cataracts removed    ? CHOLECYSTECTOMY  1990  ? DILATION AND CURETTAGE OF UTERUS    ? HERNIA REPAIR    ? POSTERIOR LAMINECTOMY / DECOMPRESSION LUMBAR SPINE    ? with T10-S1 fusion  ? TONSILLECTOMY    ? TOTAL KNEE ARTHROPLASTY Left 04/06/2013  ? Procedure: LEFT TOTAL KNEE ARTHROPLASTY;  Surgeon: Gearlean Alf, MD;  Location: [facility];  Service: Orthopedics;  Laterality: Left;  ? ?Social History:  reports that she has never smoked. She has never used smokeless tobacco. She reports that she does not drink alcohol and does not use drugs. ? ?Allergies  ?Allergen Reactions  ? Codeine Palpitations  ?  Other Reaction: RACES HEART  ? Dilaudid [Hydromorphone] Other (See Comments) and Rash  ?  Other Reaction: lightheaded, altered ms, nervous, Paranoia ?paranoid ?Other Reaction: lightheaded, altered ms, nervous, Paranoia  ? Demerol [Meperidine] Other (See Comments) and Rash  ?  agitated ?Agitation  ? Pravastatin Other (See Comments)  ?  Muscle aches  ? Amiodarone Other (See Comments)  ?  Per family pt had twitching while taking and cardiologist took pt off   ? Lipitor [Atorvastatin] Other (See Comments) and Itching  ?  unknown  ? Nifedipine Other  (See Comments)  ?  unknown  ? Statins Rash  ?  Makes her crazy  ? Sulfa Antibiotics Rash  ? Vancomycin Rash  ?  deafness  ? ? ?Family History  ?Problem Relation Age of Onset  ? COPD Sister   ?     had lung transplant  ? COPD Brother   ? ? ?Prior to Admission medications   ?Medication Sig Start Date End Date Taking? Authorizing Provider  ?acetaminophen (TYLENOL) 500 MG tablet Take 500-1,000 mg by mouth every 6 (six) hours as needed for mild pain or fever.    Yes [provider]  ?albuterol (VENTOLIN HFA) 108 (90 Base) MCG/ACT inhaler albuterol sulfate HFA 90 mcg/actuation aerosol inhaler ? INHALE 2 PUFFS INTO LUNGS 4 TIMES DAILY 05/19/20  Yes [provider]  ?apixaban (ELIQUIS) 5 MG TABS tablet Take 1 tablet (5 mg total) by mouth 2 (two) times daily. 05/22/21  Yes Shelly Coss, MD  ?aspirin EC 81 MG tablet Take 1 tablet (81 mg total) by mouth daily. MAY RESUME ON 12/31/20 12/31/20  Yes Bonnielee Haff, MD  ?carvedilol (COREG) 25 MG tablet Take 25 mg by mouth 2 (two) times daily.    Yes [provider]  ?gabapentin (NEURONTIN) 300 MG capsule Take 300 mg by mouth 2 (two) times daily.    Yes [provider]  ?hydrALAZINE (APRESOLINE) 25 MG tablet Take 1 tablet (25 mg total) by mouth 3 (three) times daily as needed (For systolic above 0000000). 10/30/21  Yes Lorella Nimrod, MD  ?insulin glargine (LANTUS) 100 UNIT/ML injection Inject 64 Units into the skin at bedtime.   Yes [provider]  ?insulin lispro (HUMALOG) 100 UNIT/ML injection Inject 22 Units into the skin 3 (three) times daily with meals.   Yes [provider]  ?liver oil-zinc oxide (DESITIN) 40 % ointment Apply 1 application topically as needed for irritation.   Yes [provider]  ?losartan (COZAAR) 25 MG tablet Take 1 tablet (25 mg total) by mouth daily. 10/30/21  Yes Lorella Nimrod, MD  ?Multiple Vitamin (MULTIVITAMIN WITH MINERALS) TABS tablet Take 1 tablet by mouth daily.   Yes [provider]  ?omeprazole (PRILOSEC) 40 MG capsule Take 40 mg by mouth daily.    Yes [provider]  ?oxybutynin (DITROPAN-XL) 10 MG 24 hr tablet Take 10 mg by mouth daily. 10/09/21  Yes [provider]  ?PARoxetine (PAXIL) 40 MG tablet Take 40 mg by mouth at bedtime.    Yes [provider]  ?rosuvastatin (CRESTOR) 5 MG tablet Take 2.5 mg by mouth at bedtime.   Yes [provider]  ?sitaGLIPtin (JANUVIA) 50 MG tablet Take 50 mg by mouth daily.   Yes [provider]  ?  torsemide (DEMADEX) 20 MG tablet Take 1 tablet (20 mg total) by mouth daily. Please start from 11/01/2021 10/30/21  Yes Lorella Nimrod, MD  ?traZODone (DESYREL) 50 MG tablet Take 50 mg by mouth at bedtime.    Yes [provider]  ?umeclidinium bromide (INCRUSE ELLIPTA) 62.5 MCG/INH AEPB Inhale 1 puff into the lungs. ?Patient not taking: Reported on 05/16/2021 05/19/20   [provider]  ? ? ?Physical Exam: ?Vitals:  ? 01/24/22 1930 01/24/22 2000 01/24/22 2025 01/24/22 2209  ?BP: (!) 188/79 (!) 165/72  (!) 180/100  ?Pulse: 80 82 83 87  ?Resp: 17 19 16  (!) 23  ?Temp:      ?TempSrc:      ?SpO2: 96% 93% 94% 98%  ? ?Physical Exam ?Vitals and nursing note reviewed.  ?Constitutional:   ?   General: She is sleeping. She is not in acute distress. ?   Appearance: She is obese.  ?   Comments: Snoring   ?HENT:  ?   Head: Normocephalic and atraumatic.  ?Cardiovascular:  ?   Rate and Rhythm: Normal rate and regular rhythm.  ?   Pulses: Normal pulses.  ?   Heart sounds: Normal heart sounds.  ?Pulmonary:  ?   Effort: Pulmonary effort is normal.  ?   Breath sounds: Normal breath sounds.  ?Abdominal:  ?   Palpations: Abdomen is soft.  ?   Tenderness: There is no abdominal tenderness.  ?Neurological:  ?   Mental Status: She is easily aroused. She is lethargic.  ? ? ? ?Data Reviewed: ?Relevant notes from primary care and specialist visits, past discharge summaries as available in EHR, including Care Everywhere. ?Prior  diagnostic testing as pertinent to current admission diagnoses ?Updated medications and problem lists for reconciliation ?ED course, including vitals, labs, imaging, treatment and response to treatment ?Triage note

## 2022-01-24 NOTE — Assessment & Plan Note (Signed)
Renal function at baseline 

## 2022-01-24 NOTE — Assessment & Plan Note (Signed)
SBP over 200 on arrival but will treat only if over 220 allowing for permissive hypertension ?

## 2022-01-24 NOTE — Assessment & Plan Note (Signed)
Blood sugar 390.  We will treat with basal insulin and sliding scale coverage ?

## 2022-01-24 NOTE — ED Triage Notes (Signed)
Pt comes ems from home with some aphasia since yesterday morning progressively worse. CBG 401 with ems. Hx of stroke, htn, chf, t2d. BP 232/102 with ems. Occasionally able to give [place] responses but also not able to speak to answer some questions. Pt's daughter on the way.  ?

## 2022-01-24 NOTE — ED Notes (Signed)
Pt. Returned from CT.

## 2022-01-24 NOTE — ED Provider Notes (Signed)
? ?[facility] ?Provider Note ? ? ? Event Date/Time  ? First MD Initiated Contact with Patient 01/24/22 1813   ?  (approximate) ? ? ?History  ? ?Aphasia and Hyperglycemia ? ? ?HPI ? ?Cheyenne Frank is a 81 y.o. female with an old stroke/TIA years ago resulting in speech difficulty which had resolved.  Patient developed word finding difficulty again yesterday.  It started this morning and has gotten progressively worse possibly waxing and waning.  Patient has a history of hypertensive CHF and diabetes.  She sometimes will run sugars in the 400s which she is doing now. ? ?  ? ? ?Physical Exam  ? ?Triage Vital Signs: ?ED Triage Vitals  ?Enc Vitals Group  ?   BP 01/24/22 1825 (!) 200/87  ?   Pulse Rate 01/24/22 1825 79  ?   Resp 01/24/22 1825 18  ?   Temp 01/24/22 1825 99.7 ?F (37.6 ?C)  ?   Temp Source 01/24/22 1825 Oral  ?   SpO2 01/24/22 1825 93 %  ?   Weight --   ?   Height --   ?   Head Circumference --   ?   Peak Flow --   ?   Pain Score 01/24/22 1816 0  ?   Pain Loc --   ?   Pain Edu? --   ?   Excl. in GC? --   ? ? ?Most recent vital signs: ?Vitals:  ? 01/24/22 1930 01/24/22 2000  ?BP: (!) 188/79 (!) 165/72  ?Pulse: 80 82  ?Resp: 17 19  ?Temp:    ?SpO2: 96% 93%  ? ? ? ?General: Awake, alert looks anxious but in no pain ?Head normocephalic atraumatic ?Eyes pupils equal round reactive extraocular movements intact ?CV:  Good peripheral perfusion.  Heart regular rate and rhythm no audible murmurs ?Resp:  Normal effort.  Lungs are clear ?Abd:  No distention.  Abdomen soft bowel sounds positive nontender ?Neuro: Patient having a lot of trouble finding words.  She can speak clearly when she finds the words but has trouble finding them.  She understands quite well.  Cranial nerves II through XII appear to be intact although visual fields were not checked.  Motor strength is 5 or 5 in the arms and legs patient does not appear to have any numbness.  Rapid alternating movements in the hands and  finger-nose are normal. ? ? ?ED Results / Procedures / Treatments  ? ?Labs ?(all labs ordered are listed, but only abnormal results are displayed) ?Labs Reviewed  ?COMPREHENSIVE METABOLIC PANEL - Abnormal; Notable for the following components:  ?    Result Value  ? Potassium 3.4 (*)   ? Glucose, Bld 390 (*)   ? BUN 25 (*)   ? Creatinine, Ser 1.30 (*)   ? Calcium 8.8 (*)   ? Albumin 3.2 (*)   ? GFR, Estimated 42 (*)   ? All other components within normal limits  ?CBG MONITORING, ED - Abnormal; Notable for the following components:  ? Glucose-Capillary 406 (*)   ? All other components within normal limits  ?CBG MONITORING, ED - Abnormal; Notable for the following components:  ? Glucose-Capillary 343 (*)   ? All other components within normal limits  ?CBG MONITORING, ED - Abnormal; Notable for the following components:  ? Glucose-Capillary 315 (*)   ? All other components within normal limits  ?CBC WITH DIFFERENTIAL/PLATELET  ?CBG MONITORING, ED  ?TROPONIN I (HIGH SENSITIVITY)  ? ? ? ?  EKG ?EKG read interpreted by me shows normal sinus rhythm rate of 85 normal axis there is some slight ST segment downsloping inferiorly and in V5 and 6. ? ? ? ?RADIOLOGY ?CT read by radiology reviewed by me is negative. ? ? ?PROCEDURES: ? ?Critical Care performed: Critical care time about 25 minutes.  This includes speaking with the patient and EMS and the neurologist and the hospitalist.  Patient I talked to MRI about doing the MRI.  Patient has trouble laying down flat which we learn from talking to the family.  She does have some CHF.  We will try and elevate the patient's head a little bit we can do that up to 18 degrees give her some oxygen see if we can get the MRI and MRA that way. ? ?Procedures ? ? ?MEDICATIONS ORDERED IN ED: ?Medications  ?clotrimazole (GYNE-LOTRIMIN) vaginal cream 1 Applicatorful (has no administration in time range)  ?LORazepam (ATIVAN) injection 1 mg (1 mg Intravenous Given 01/24/22 2101)  ?insulin aspart  (novoLOG) injection 3 Units (has no administration in time range)  ?insulin aspart (novoLOG) injection 5 Units (5 Units Intravenous Given 01/24/22 1921)  ? ? ? ?IMPRESSION / MDM / ASSESSMENT AND PLAN / ED COURSE  ?I reviewed the triage vital signs and the nursing notes. ?Patient does not appear to be confused but she is anxious and having word finding difficulty.  CT is negative we will get the MRI/MRA see what that looks like plan is to admit her to the hospital for stroke. ?Patient does not appear to be having any seizure symptoms although that could be in the differential if the patient's MRI does not show any stroke.  It is also possible that the high blood sugar has made the symptoms of the previous episode of aphasia return.  Patient will still have to come in the hospital 1 where the other.  Patient's blood sugar was initially quite high it is coming down slowly with insulin.  I am not giving her fluids because her history of CHF and trouble breathing.  I want her to be a lay as flat as possible for the MRI. ? ? ?The patient is on the cardiac monitor to evaluate for evidence of arrhythmia and/or significant heart rate changes.  None have been seen ? ?  ? ? ?FINAL CLINICAL IMPRESSION(S) / ED DIAGNOSES  ? ?Final diagnoses:  ?Hyperglycemia  ?Cerebrovascular accident (CVA), unspecified mechanism (HCC)  ? ? ? ?Rx / DC Orders  ? ?ED Discharge Orders   ? ? None  ? ?  ? ? ? ?Note:  This document was prepared using Dragon voice recognition software and may include unintentional dictation errors. ?  ?Arnaldo Natal, MD ?01/24/22 2108 ? ?

## 2022-01-24 NOTE — ED Notes (Signed)
Pt's neuro status has improved. Pt now able to speak in short phrases. She is able to recognize daughter at bedside, which she was unable to do earlier. Pt following commands. EDP updated.  ? ?Pt was placed on purwick, noted significant infection to groin area. That is painful to touch ?

## 2022-01-24 NOTE — Assessment & Plan Note (Signed)
Euvolemic.  Holding carvedilol, hydralazine and torsemide and losartan for tonight to allow permissive hypertension ?

## 2022-01-25 ENCOUNTER — Observation Stay: Payer: Medicare Other

## 2022-01-25 DIAGNOSIS — G4730 Sleep apnea, unspecified: Secondary | ICD-10-CM | POA: Diagnosis not present

## 2022-01-25 DIAGNOSIS — E1165 Type 2 diabetes mellitus with hyperglycemia: Secondary | ICD-10-CM

## 2022-01-25 DIAGNOSIS — G40909 Epilepsy, unspecified, not intractable, without status epilepticus: Secondary | ICD-10-CM | POA: Diagnosis present

## 2022-01-25 DIAGNOSIS — Z8673 Personal history of transient ischemic attack (TIA), and cerebral infarction without residual deficits: Secondary | ICD-10-CM | POA: Diagnosis not present

## 2022-01-25 DIAGNOSIS — F0390 Unspecified dementia without behavioral disturbance: Secondary | ICD-10-CM | POA: Diagnosis present

## 2022-01-25 DIAGNOSIS — R531 Weakness: Secondary | ICD-10-CM | POA: Diagnosis present

## 2022-01-25 DIAGNOSIS — E11649 Type 2 diabetes mellitus with hypoglycemia without coma: Secondary | ICD-10-CM | POA: Diagnosis not present

## 2022-01-25 DIAGNOSIS — R29818 Other symptoms and signs involving the nervous system: Secondary | ICD-10-CM | POA: Diagnosis not present

## 2022-01-25 DIAGNOSIS — I5032 Chronic diastolic (congestive) heart failure: Secondary | ICD-10-CM

## 2022-01-25 DIAGNOSIS — Z794 Long term (current) use of insulin: Secondary | ICD-10-CM

## 2022-01-25 DIAGNOSIS — E1122 Type 2 diabetes mellitus with diabetic chronic kidney disease: Secondary | ICD-10-CM | POA: Diagnosis present

## 2022-01-25 DIAGNOSIS — R4701 Aphasia: Secondary | ICD-10-CM | POA: Diagnosis not present

## 2022-01-25 DIAGNOSIS — N1832 Chronic kidney disease, stage 3b: Secondary | ICD-10-CM | POA: Diagnosis present

## 2022-01-25 DIAGNOSIS — I10 Essential (primary) hypertension: Secondary | ICD-10-CM | POA: Diagnosis not present

## 2022-01-25 DIAGNOSIS — I130 Hypertensive heart and chronic kidney disease with heart failure and stage 1 through stage 4 chronic kidney disease, or unspecified chronic kidney disease: Secondary | ICD-10-CM | POA: Diagnosis present

## 2022-01-25 DIAGNOSIS — Z881 Allergy status to other antibiotic agents status: Secondary | ICD-10-CM | POA: Diagnosis not present

## 2022-01-25 DIAGNOSIS — G2581 Restless legs syndrome: Secondary | ICD-10-CM | POA: Diagnosis present

## 2022-01-25 DIAGNOSIS — I160 Hypertensive urgency: Secondary | ICD-10-CM | POA: Diagnosis present

## 2022-01-25 DIAGNOSIS — R739 Hyperglycemia, unspecified: Secondary | ICD-10-CM | POA: Diagnosis present

## 2022-01-25 DIAGNOSIS — Z7901 Long term (current) use of anticoagulants: Secondary | ICD-10-CM | POA: Diagnosis not present

## 2022-01-25 DIAGNOSIS — I639 Cerebral infarction, unspecified: Secondary | ICD-10-CM | POA: Diagnosis present

## 2022-01-25 DIAGNOSIS — E876 Hypokalemia: Secondary | ICD-10-CM | POA: Diagnosis present

## 2022-01-25 DIAGNOSIS — I480 Paroxysmal atrial fibrillation: Secondary | ICD-10-CM | POA: Diagnosis present

## 2022-01-25 DIAGNOSIS — J449 Chronic obstructive pulmonary disease, unspecified: Secondary | ICD-10-CM | POA: Diagnosis present

## 2022-01-25 DIAGNOSIS — G4733 Obstructive sleep apnea (adult) (pediatric): Secondary | ICD-10-CM | POA: Diagnosis present

## 2022-01-25 DIAGNOSIS — R569 Unspecified convulsions: Secondary | ICD-10-CM | POA: Diagnosis not present

## 2022-01-25 DIAGNOSIS — Z6839 Body mass index (BMI) 39.0-39.9, adult: Secondary | ICD-10-CM | POA: Diagnosis not present

## 2022-01-25 DIAGNOSIS — N390 Urinary tract infection, site not specified: Secondary | ICD-10-CM | POA: Diagnosis present

## 2022-01-25 DIAGNOSIS — B9620 Unspecified Escherichia coli [E. coli] as the cause of diseases classified elsewhere: Secondary | ICD-10-CM | POA: Diagnosis present

## 2022-01-25 DIAGNOSIS — G9341 Metabolic encephalopathy: Secondary | ICD-10-CM | POA: Diagnosis present

## 2022-01-25 LAB — CBG MONITORING, ED
Glucose-Capillary: 251 mg/dL — ABNORMAL HIGH (ref 70–99)
Glucose-Capillary: 225 mg/dL — ABNORMAL HIGH (ref 70–99)

## 2022-01-25 LAB — GLUCOSE, CAPILLARY
Glucose-Capillary: 197 mg/dL — ABNORMAL HIGH (ref 70–99)
Glucose-Capillary: 191 mg/dL — ABNORMAL HIGH (ref 70–99)
Glucose-Capillary: 299 mg/dL — ABNORMAL HIGH (ref 70–99)

## 2022-01-25 LAB — MAGNESIUM: Magnesium: 2.1 mg/dL (ref 1.7–2.4)

## 2022-01-25 LAB — LIPID PANEL
Cholesterol: 181 mg/dL (ref 0–200)
HDL: 33 mg/dL — ABNORMAL LOW (ref >40)
LDL Cholesterol: 107 mg/dL — ABNORMAL HIGH (ref 0–99)
Total CHOL/HDL Ratio: 5.5 RATIO
Triglycerides: 204 mg/dL — ABNORMAL HIGH (ref <150)
VLDL: 41 mg/dL — ABNORMAL HIGH (ref 0–40)

## 2022-01-25 LAB — TROPONIN I (HIGH SENSITIVITY): Troponin I (High Sensitivity): 17 ng/L (ref <18)

## 2022-01-25 MED ORDER — OXYBUTYNIN CHLORIDE ER 10 MG PO TB24
10.0000 mg | ORAL_TABLET | Freq: Every day | ORAL | Status: DC
  Administered 2022-01-26 – 2022-02-01 (×7): 10 mg via ORAL
  Filled 2022-01-25 (×10): qty 1

## 2022-01-25 MED ORDER — INSULIN ASPART 100 UNIT/ML IJ SOLN
0.0000 [IU] | Freq: Every day | INTRAMUSCULAR | Status: DC
  Administered 2022-01-25 – 2022-01-26 (×2): 3 [IU] via SUBCUTANEOUS
  Administered 2022-01-27: 4 [IU] via SUBCUTANEOUS
  Filled 2022-01-25 (×2): qty 1

## 2022-01-25 MED ORDER — PANTOPRAZOLE SODIUM 40 MG PO TBEC
40.0000 mg | DELAYED_RELEASE_TABLET | Freq: Every day | ORAL | Status: DC
  Administered 2022-01-26 – 2022-02-02 (×8): 40 mg via ORAL
  Filled 2022-01-25 (×9): qty 1

## 2022-01-25 MED ORDER — CARVEDILOL 25 MG PO TABS
25.0000 mg | ORAL_TABLET | Freq: Two times a day (BID) | ORAL | Status: DC
  Administered 2022-01-25 – 2022-02-03 (×18): 25 mg via ORAL
  Filled 2022-01-25 (×19): qty 1

## 2022-01-25 MED ORDER — HYDRALAZINE HCL 50 MG PO TABS
25.0000 mg | ORAL_TABLET | Freq: Three times a day (TID) | ORAL | Status: DC | PRN
  Administered 2022-01-25 – 2022-01-31 (×3): 25 mg via ORAL
  Filled 2022-01-25 (×3): qty 1

## 2022-01-25 MED ORDER — LEVETIRACETAM IN NACL 500 MG/100ML IV SOLN
500.0000 mg | Freq: Two times a day (BID) | INTRAVENOUS | Status: DC
  Administered 2022-01-25 – 2022-01-26 (×2): 500 mg via INTRAVENOUS
  Filled 2022-01-25 (×3): qty 100

## 2022-01-25 MED ORDER — ADULT MULTIVITAMIN W/MINERALS CH
1.0000 | ORAL_TABLET | Freq: Every day | ORAL | Status: DC
  Administered 2022-01-26 – 2022-02-02 (×8): 1 via ORAL
  Filled 2022-01-25 (×9): qty 1

## 2022-01-25 MED ORDER — INSULIN ASPART 100 UNIT/ML IJ SOLN
0.0000 [IU] | Freq: Three times a day (TID) | INTRAMUSCULAR | Status: DC
  Administered 2022-01-25: 2 [IU] via SUBCUTANEOUS
  Administered 2022-01-26: 3 [IU] via SUBCUTANEOUS
  Administered 2022-01-26: 5 [IU] via SUBCUTANEOUS
  Administered 2022-01-26: 2 [IU] via SUBCUTANEOUS
  Administered 2022-01-27: 5 [IU] via SUBCUTANEOUS
  Administered 2022-01-27: 2 [IU] via SUBCUTANEOUS
  Administered 2022-01-27: 3 [IU] via SUBCUTANEOUS
  Administered 2022-01-28: 9 [IU] via SUBCUTANEOUS
  Filled 2022-01-25 (×8): qty 1

## 2022-01-25 MED ORDER — LOSARTAN POTASSIUM 25 MG PO TABS
25.0000 mg | ORAL_TABLET | Freq: Every day | ORAL | Status: DC
  Administered 2022-01-25 – 2022-02-03 (×10): 25 mg via ORAL
  Filled 2022-01-25 (×10): qty 1

## 2022-01-25 NOTE — Consult Note (Signed)
WOC Nurse Consult Note: ?Reason for Consult: groin, fungal overgrowth ?Wound type: MASD (moisture associated skin damage) ?Pressure Injury POA: NA ?Dressing procedure/placement/frequency: ?Use antimicrobial wicking fabric to manage moisture and treat potential fungal overgrowth. ? ?Measure and cut length of InterDry to fit in skin folds that have skin breakdown ?Tuck InterDry fabric into skin folds in a single layer, allow for 2 inches of overhang from skin edges to allow for wicking to occur ?May remove to bathe; dry area thoroughly and then tuck into affected areas again ?Do not apply any creams or ointments when using InterDry ?DO NOT THROW AWAY FOR 5 DAYS unless soiled with stool ?DO NOT [facility] product, this will inactivate the silver in the material  ?New sheet of Interdry should be applied after 5 days of use if patient continues to have skin breakdown  ?  ? ?Re consult if needed, will not follow at this time. ?Thanks ? Jonluke Cobbins [facility] MSN, RN,CWOCN, CNS, CWON-AP [phone])  ?

## 2022-01-25 NOTE — Progress Notes (Signed)
Admission skin assessment complete. Bilat feet with dry, scaly skin. Coccyx, sacrum and buttocks intact with no redness noted. Moisture associated, chemical breakdown noted to abd folds and groin. Multiple areas of fungal-appearing residue noted. Small open areas with purulent drainage also noted. MD made aware and wound care consulted.  ?

## 2022-01-25 NOTE — Procedures (Signed)
Patient Name: Cheyenne Frank  ?MRN: [id number]  ?Epilepsy Attending: Charlsie Quest  ?Referring Physician/Provider: Gordy Councilman, MD ?Date: 01/25/2022 ?Duration: 22.31 mins ? ?Patient history: 81 y.o. female with medical history significant for Paroxysmal A-fib on Eliquis, prior stroke presenting to the ED with difficulty getting her words out similar to her prior strokes. EEG to evaluate for seizure ? ?Level of alertness: Awake, asleep ? ?AEDs during EEG study: LEV ? ?Technical aspects: This EEG study was done with scalp electrodes positioned according to the 10-20 International system of electrode placement. Electrical activity was acquired at a sampling rate of 500Hz  and reviewed with a high frequency filter of 70Hz  and a low frequency filter of 1Hz . EEG data were recorded continuously and digitally stored.  ? ?Description: The posterior dominant rhythm consists of 8-9 Hz activity of moderate voltage (25-35 uV) seen predominantly in posterior head regions, symmetric and reactive to eye opening and eye closing. Sleep was characterized by vertex waves, sleep spindles (12 to 14 Hz), maximal frontocentral region. EEG showed intermittent generalized 3 to 6 Hz theta-delta slowing.  Hyperventilation and photic stimulation were not performed.    ? ?ABNORMALITY ?- Intermittent slow, generalized ? ?IMPRESSION: ?This study is suggestive of mild diffuse encephalopathy, nonspecific etiology. No seizures or epileptiform discharges were seen throughout the recording. ? ?  ? ?

## 2022-01-25 NOTE — ED Notes (Signed)
Pt placed on NRB for O2 in the 70s while sleeping ?

## 2022-01-25 NOTE — Progress Notes (Signed)
Eeg done 

## 2022-01-25 NOTE — Progress Notes (Signed)
Patient refuses hospital bipap/cpap unit. I explained to patient that MD put an order in and she states she will not wear it. No further order changes at this time. No unit at bedside.  ?

## 2022-01-25 NOTE — ED Notes (Signed)
Pt destated low 80s. On 2 L of supplemental oxygen. SpO2 increased to 96% when aroused. Pt remains on oxygen and cont spo2 monitoring.  ?

## 2022-01-25 NOTE — Progress Notes (Signed)
Arrived from ER in stable condition. Patient responsive to verbal stimuli. Very lethargic.  ?

## 2022-01-25 NOTE — Progress Notes (Signed)
OT Cancellation Note ? ?Patient Details ?Name: Cheyenne Frank ?MRN: [id number] ?DOB: Nov 09, 1940 ? ? ?Cancelled Treatment:    Reason Eval/Treat Not Completed: Fatigue/lethargy limiting ability to participate;Patient at procedure or test/ unavailable. Order received and chart reviewed. Upon arrival pt being transported to floor - continues to be lethargic. Will follow up at later date/time as appropriate for OT evaluation.  ? ?Kathie Dike, M.S. OTR/L  ?01/25/22, 11:57 AM  ?ascom [phone] ? ?

## 2022-01-25 NOTE — Progress Notes (Signed)
Admission information obtained from patient's daughter [name]. [name] states patient lives with her sister Aram Beecham and ambulates short distances with a walker at baseline. Does endorse decline over past couple months.  ?

## 2022-01-25 NOTE — Progress Notes (Signed)
Inpatient Diabetes Program Recommendations ? ?AACE/ADA: New Consensus Statement on Inpatient Glycemic Control (2015) ? ?Target Ranges:  Prepandial:   less than 140 mg/dL ?     Peak postprandial:   less than 180 mg/dL (1-2 hours) ?     Critically ill patients:  140 - 180 mg/dL  ? ?Lab Results  ?Component Value Date  ? GLUCAP 197 (H) 01/25/2022  ? HGBA1C 8.5 (H) 10/26/2021  ? ? ?Review of Glycemic Control ? Latest Reference Range & Units 01/24/22 20:09 01/24/22 22:12 01/25/22 01:32 01/25/22 07:50 01/25/22 12:34  ?Glucose-Capillary 70 - 99 mg/dL 710 (H) 626 (H) 948 (H) 225 (H) 197 (H)  ?(H): Data is abnormally high ? ?Diabetes history: DM2 ?Outpatient Diabetes medications: Lantus 64 units qd, Humalog 22 units tid meal coverage ?Current orders for Inpatient glycemic control: Semglee 30 units q hs ? ?Inpatient Diabetes Program Recommendations:   ?-Add Novolog 0-6 units correction tid + hs 0-5 units ?Secure chat sent to Dr. Allena Katz. ? ?Thank you, ?Cheyenne Fischer Aalijah Mims, RN, MSN, CDE  ?Diabetes Coordinator ?Inpatient Glycemic Control Team ?Team Pager [phone] (8am-5pm) ?01/25/2022 12:53 PM ? ? ? ? ?

## 2022-01-25 NOTE — Progress Notes (Signed)
SLP Cancellation Note ? ?Patient Details ?Name: Cheyenne Frank ?MRN: [id number] ?DOB: 04/02/1941 ? ? ?Cancelled treatment:       Reason Eval/Treat Not Completed: Medical issues which prohibited therapy (Spoke with Charity fundraiser. Pt remains unable to safely participate in clinical swallowing evaluation or to participate in speech/language evaluation due to AMS. Per RN, pt alert, but minimally responsive. Will continue efforts as appropriate.) ? ?Clyde Canterbury, M.S., CCC-SLP ?Speech-Language Pathologist ?[place] [facility] ?[phone] (ASCOM) ? ?Woodroe Chen ?01/25/2022, 11:40 AM ?

## 2022-01-25 NOTE — Evaluation (Addendum)
Clinical/Bedside Swallow Evaluation ?Patient Details  ?Name: Cheyenne Frank ?MRN: [id number] ?Date of Birth: 1940-11-29 ? ?Today's Date: 01/25/2022 ?Time: SLP Start Time (ACUTE ONLY): [id number] SLP Stop Time (ACUTE ONLY): [id number] ?SLP Time Calculation (min) (ACUTE ONLY): 60 min ? ?Past Medical History:  ?Past Medical History:  ?Diagnosis Date  ? AKI (acute kidney injury) ([place])   ? Anemia of chronic disease   ? Anxiety   ? Arthritis   ? Asthma   ? no problems since 1999 (after moving into a new home)  ? Chronic airway obstruction ([facility])   ? Complication of anesthesia   ? " feel like I'm dying after I wake up"  ? Diabetes mellitus without complication ([place])   ? Diverticulosis   ? Dysrhythmia   ? unknown type   ? Fibromyalgia   ? Flat back syndrome   ? GERD (gastroesophageal reflux disease)   ? Hx of transfusion 2011  ? Hypertension   ? Itching   ? Macular degeneration   ? Nocturia   ? Postoperative anemia due to acute blood loss 04/09/2013  ? Restless leg syndrome   ? Sleep apnea   ? Dx 1990's unable to wear c-pap -  ? Stroke [facility]) 2010  ? verbal aphasia x 30 min - resolved - no problem since then  ? ?Past Surgical History:  ?Past Surgical History:  ?Procedure Laterality Date  ? ABDOMINAL HYSTERECTOMY    ? rt so  ? BACK SURGERY  2010 / 2011  ? BREAST REDUCTION SURGERY    ? BURR HOLE FOR SUBDURAL HEMATOMA  2015  ? cataracts removed    ? CHOLECYSTECTOMY  1990  ? DILATION AND CURETTAGE OF UTERUS    ? HERNIA REPAIR    ? POSTERIOR LAMINECTOMY / DECOMPRESSION LUMBAR SPINE    ? with T10-S1 fusion  ? TONSILLECTOMY    ? TOTAL KNEE ARTHROPLASTY Left 04/06/2013  ? Procedure: LEFT TOTAL KNEE ARTHROPLASTY;  Surgeon: Gearlean Alf, MD;  Location: [facility];  Service: Orthopedics;  Laterality: Left;  ? ?HPI:  ?Pt is a 81 y.o. female with medical history significant for Obesity, Anxiety, Paroxysmal A-fib on Eliquis, prior strokes, AKI, GERD, Fibromyalgia, HTN, insulin-dependent type 2 diabetes, sleep apnea, CKD stage IIIb, COPD, diastolic CHF  presenting to the ED with some difficulty getting her words out, similar to her prior strokes.   ?CT head nonacute MRI brain showed no acute abnormality.  MD concerned for Seizure activity per NSG and pt's Dtr.  Pt lives at home w/ 1/2 Daughters.   ?Pt was recently admitted to this facility w/ acute on chronic CHF; worsening shortness of breath, weight gain, leg edema that admit.  ?  ?Assessment / Plan / Recommendation  ?Clinical Impression ? Pt appears to present w/ adequate oropharyngeal phase swallow w/ No oropharyngeal phase dysphagia noted, No neuromuscular deficits noted. Pt consumed po trials w/ No overt, clinical s/s of aspiration during po trials. Pt appears at reduced risk for aspiration following general aspiration precautions and when given support and setup at meals. Unsure of pt's Baseline Cognitive status in setting of multiple co-morbidities, but pt did benefit from verbal cues during tasks for follow through. Pt is Deaf in Left ear.  ? ?During po trials, pt consumed all consistencies w/ no overt coughing, decline in vocal quality, or change in respiratory presentation during/post trials. O2 sats remained 96%. Oral phase appeared [facility] w/ timely bolus management, mastication, and control of bolus propulsion for A-P transfer for swallowing. Oral clearing  achieved w/ all trial consistencies. OM Exam appeared [facility] w/ no unilateral weakness noted. Speech Clear. Pt fed self w/ setup support.  ? ?Recommend a more Mech Soft diet consistency w/ well-Cut meats, moistened foods for ease of intake/self-feeding; Thin liquids.  ?Recommend general aspiration precautions, Pills WHOLE vs Crushed in Puree for safer, easier swallowing. Education given on Pills in Puree; food consistencies and easy to eat options; general aspiration precautions; support strategies at meals to Dtr and NSG. NSG to reconsult if any new swallowing needs arise. NSG agreed. MD updated.  ?Recommend f/u w/ cognitive-linguistic evaluation if  ongoing communication deficits noted. In the setting of concern for ongoing Seizure activity, pt may benefit from f/u for a formal assessment at next venue of care post resolution of Acute issues.  ?SLP Visit Diagnosis: Dysphagia, unspecified (R13.10) ?   ?Aspiration Risk ?  (reduced following general aspiration precautions)  ?  ?Diet Recommendation   Mech Soft diet consistency w/ well-Cut meats, moistened foods; Thin liquids.  ?Recommend general aspiration precautions, support w/ positioning and feeding at meals.  ? ?Medication Administration: Whole meds with puree (vs need to Crush w/ Puree)  ?  ?Other  Recommendations Recommended Consults:  (Dietician f/u) ?Oral Care Recommendations: Oral care BID;Oral care before and after PO;Staff/trained caregiver to provide oral care (support) ?Other Recommendations:  (n/a)   ? ?Recommendations for follow up therapy are one component of a multi-disciplinary discharge planning process, led by the attending physician.  Recommendations may be updated based on patient status, additional functional criteria and insurance authorization. ? ?Follow up Recommendations No SLP follow up  ? ? ?  ?Assistance Recommended at Discharge Intermittent Supervision/Assistance  ?Functional Status Assessment Patient has had a recent decline in their functional status and demonstrates the ability to make significant improvements in function in a reasonable and predictable amount of time.  ?Frequency and Duration  (n/a)  ?  ?  ?   ? ?Prognosis Prognosis for Safe Diet Advancement: Good ?Barriers to Reach Goals: Time post onset;Severity of deficits ?Barriers/Prognosis Comment: deconditioned status  ? ?  ? ?Swallow Study   ?General Date of Onset: 01/24/22 ?HPI: Pt is a 81 y.o. female with medical history significant for Obesity, Paroxysmal A-fib on Eliquis, prior strokes, AKI, GERD, Fibromyalgia, HTN, insulin-dependent type 2 diabetes, sleep apnea, CKD stage IIIb, COPD, diastolic CHF presenting to the  ED with some difficulty getting her words out, similar to her prior strokes.  CT head nonacute MRI brain showed no acute abnormality.  MD concerned for Seizure activity per NSG and pt's Dtr.  Pt lives at home w/ 1/2 Daughters.  Pt was recently admitted to this facility w/ acute on chronic CHF; worsening shortness of breath, weight gain, leg edema that admit. ?Type of Study: Bedside Swallow Evaluation ?Previous Swallow Assessment: none; seen by ST services for speech-language needs in 2015 ?Diet Prior to this Study: NPO (eats a regular diet at home per Dtr) ?Temperature Spikes Noted: No (wbc 8.2) ?Respiratory Status: Nasal cannula (2L) ?History of Recent Intubation: No ?Behavior/Cognition: Alert;Cooperative;Pleasant mood;Distractible;Requires cueing (unsure of pt's baseline Cognitive status) ?Oral Cavity Assessment: Within Functional Limits ?Oral Care Completed by SLP: Recent completion by staff ?Oral Cavity - Dentition: Adequate natural dentition ?Vision: Functional for self-feeding ?Self-Feeding Abilities: Able to feed self;Needs assist;Needs set up (weak UEs) ?Patient Positioning: Upright in bed (needed full positioning support) ?Baseline Vocal Quality: Normal ?Volitional Cough: Strong ?Volitional Swallow: Able to elicit  ?  ?Oral/Motor/Sensory Function Overall Oral Motor/Sensory Function: Within functional limits   ?  Ice Chips Ice chips: Within functional limits ?Presentation: Spoon (fed; 2 trials)   ?Thin Liquid Thin Liquid: Within functional limits ?Presentation: Self Fed;Cup;Straw (~6 ozs via straw; 3 tirals via cup)  ?  ?Nectar Thick Nectar Thick Liquid: Not tested   ?Honey Thick Honey Thick Liquid: Not tested   ?Puree Puree: Within functional limits ?Presentation: Spoon (fed; 6 tirals)   ?Solid ? ? ?  Solid: Within functional limits ?Presentation: Spoon (fed; 6 trials)  ? ?  ? ? ? ? ?Orinda Kenner, MS, CCC-SLP ?Speech Language Pathologist ?[facility]; [place] ?[phone]  (ascom) ?Ellyana Crigler ?01/25/2022,5:11 PM ? ? ? ?

## 2022-01-25 NOTE — Progress Notes (Signed)
PT Cancellation Note ? ?Patient Details ?Name: NORAA SCHAUMANN ?MRN: [id number] ?DOB: December 05, 1940 ? ? ?Cancelled Treatment:    Reason Eval/Treat Not Completed: Fatigue/lethargy limiting ability to participate. Patient unable to participate in PT eval this am. Will re-attempt at later time/date.  ? ? ?Raffael Bugarin ?01/25/2022, 11:41 AM ?

## 2022-01-25 NOTE — Progress Notes (Signed)
Neurology at bedside.

## 2022-01-25 NOTE — Progress Notes (Signed)
Patient off unit for EEG in stable condition. ?

## 2022-01-25 NOTE — Progress Notes (Signed)
[facility]  - [place] [facility] ? ? ?PATIENT NAME: Cheyenne PeeksGeneva Frank   ? ?MR#:  [id number] ? ?DATE OF BIRTH:  05/09/1941 ? ?SUBJECTIVE:  ?patient seen in the emergency room. Came in with dysarthria. She received 2 mg of Ativan yesterday for getting MRI done. Very sleepy. Awakens on verbal command. Unable to verbalize however understands most of the questions I asked her. Falls back to sleep easily. No family at bedside. ?Blood pressure bit on the higher side. ? ?VITALS:  ?Blood pressure (!) 181/82, pulse 84, temperature 98.6 ?F (37 ?C), resp. rate 17, SpO2 100 %. ? ?PHYSICAL EXAMINATION:  ?limited ?GENERAL:  81 y.o.-year-old patient lying in the bed with no acute distress. Morbid obesity ?LUNGS: Normal breath sounds bilaterally, no wheezing, rales, rhonchi.  ?CARDIOVASCULAR: S1, S2 normal. No murmurs  ?ABDOMEN: Soft, nontender, nondistended. Morbidly obese bowel sounds present.  ?EXTREMITIES: puffy + edema ?NEUROLOGIC: nonfocal  patient is drowsy ?SKIN: per RN ?LABORATORY PANEL:  ?CBC ?Recent Labs  ?Lab 01/24/22 ?1830  ?WBC 8.2  ?HGB 12.8  ?HCT 39.9  ?PLT 159  ? ? ?Chemistries  ?Recent Labs  ?Lab 01/24/22 ?1830  ?NA 139  ?K 3.4*  ?CL 100  ?CO2 29  ?GLUCOSE 390*  ?BUN 25*  ?CREATININE 1.30*  ?CALCIUM 8.8*  ?AST 33  ?ALT 21  ?ALKPHOS 82  ?BILITOT 0.9  ? ?Cardiac Enzymes ?No results for input(s): TROPONINI in the last 168 hours. ?RADIOLOGY:  ?CT Head Wo Contrast ? ?Result Date: 01/24/2022 ?CLINICAL DATA:  Stroke suspected neuro deficit EXAM: CT HEAD WITHOUT CONTRAST TECHNIQUE: Contiguous axial images were obtained from the base of the skull through the vertex without intravenous contrast. RADIATION DOSE REDUCTION: This exam was performed according to the departmental dose-optimization program which includes automated exposure control, adjustment of the mA and/or kV according to patient size and/or use of iterative reconstruction technique. COMPARISON:  01/13/2021 FINDINGS: Brain: No evidence of acute  infarction, hemorrhage, cerebral edema, mass, mass effect, or midline shift. No hydrocephalus or extra-axial fluid collection. Redemonstrated left thalamic lacunar infarct. Periventricular white matter changes, likely the sequela of chronic small vessel ischemic disease. Cerebral volume loss is likely within normal limits for age. Vascular: No hyperdense vessel. Atherosclerotic calcifications in the intracranial carotid and vertebral arteries. Skull: Hyperostosis frontalis. Left frontal burr hole. Negative for fracture or focal lesion. Sinuses/Orbits: No acute finding. Other: The mastoid air cells are well aerated. IMPRESSION: No acute intracranial process. Electronically Signed   By: Wiliam KeAlison  Vasan M.D.   On: 01/24/2022 18:53  ? ?MR ANGIO HEAD WO CONTRAST ? ?Result Date: 01/24/2022 ?CLINICAL DATA:  Initial evaluation for neuro deficit, stroke suspected. EXAM: MRI HEAD WITHOUT CONTRAST MRA HEAD WITHOUT CONTRAST MRA NECK WITHOUT AND WITH CONTRAST TECHNIQUE: Multiplanar, multi-echo pulse sequences of the brain and surrounding structures were acquired without intravenous contrast. Angiographic images of the Circle of Willis were acquired using MRA technique without intravenous contrast. Angiographic images of the neck were acquired using MRA technique without and with intravenous contrast. Carotid stenosis measurements (when applicable) are obtained utilizing NASCET criteria, using the distal internal carotid diameter as the denominator. CONTRAST:  None. COMPARISON:  Comparison made with prior CT from earlier the same day. FINDINGS: MRI HEAD FINDINGS Brain: Examination technically limited as the patient was unable to tolerate the full length of the study. Axial and coronal DWI sequences, with axial T2 and sagittal T1 weighted sequences only were performed. Additionally, the provided images are degraded by motion. Generalized age-related cerebral atrophy. Remote lacunar  infarct present at the ventral left thalamus. No  abnormal foci of restricted diffusion to suggest acute or subacute ischemia. Gray-white matter differentiation otherwise grossly maintained. No other visible areas of chronic cortical infarction. No obvious evidence for intracranial hemorrhage on this limited exam. No visible mass lesion, mass effect or midline shift. Ventricles normal size without hydrocephalus. No extra-axial fluid collection. Vascular: Major intracranial vascular flow voids are grossly maintained at the skull base. Skull and upper cervical spine: Craniocervical junction grossly within normal limits. No obvious focal marrow replacing lesion. No scalp soft tissue abnormality. Sinuses/Orbits: Prior bilateral ocular lens replacement. Globes and orbital soft tissues demonstrate no definite acute abnormality. Mild scattered mucosal thickening about the ethmoidal air cells. Paranasal sinuses are otherwise largely clear. No significant mastoid effusion. Other: None. MRA HEAD FINDINGS Not performed as the patient was unable to tolerate this portion of the ordered study. MRA NECK FINDINGS Not performed as the patient was unable to tolerate this portion of the ordered study. IMPRESSION: 1. Technically limited exam due to the patient's inability to tolerate the full length of the study and motion artifact. 2. No acute intracranial infarct. No other definite acute abnormality identified. 3. Age-related cerebral atrophy with remote left thalamic lacunar infarct. 4. Please note that the ordered MRAs of the head and neck were unable to be performed due to the patient's inability to tolerate the full length of the study. No charges should be applied to the patient. A repeat examination could be attempted at a later time as the patient is able to tolerate. Alternatively, correlation with dedicated CTA and/or carotid Doppler ultrasound could also be performed as warranted. Electronically Signed   By: Rise Mu M.D.   On: 01/24/2022 22:02  ? ?MR Angiogram  Neck W or Wo Contrast ? ?Result Date: 01/24/2022 ?CLINICAL DATA:  Initial evaluation for neuro deficit, stroke suspected. EXAM: MRI HEAD WITHOUT CONTRAST MRA HEAD WITHOUT CONTRAST MRA NECK WITHOUT AND WITH CONTRAST TECHNIQUE: Multiplanar, multi-echo pulse sequences of the brain and surrounding structures were acquired without intravenous contrast. Angiographic images of the Circle of Willis were acquired using MRA technique without intravenous contrast. Angiographic images of the neck were acquired using MRA technique without and with intravenous contrast. Carotid stenosis measurements (when applicable) are obtained utilizing NASCET criteria, using the distal internal carotid diameter as the denominator. CONTRAST:  None. COMPARISON:  Comparison made with prior CT from earlier the same day. FINDINGS: MRI HEAD FINDINGS Brain: Examination technically limited as the patient was unable to tolerate the full length of the study. Axial and coronal DWI sequences, with axial T2 and sagittal T1 weighted sequences only were performed. Additionally, the provided images are degraded by motion. Generalized age-related cerebral atrophy. Remote lacunar infarct present at the ventral left thalamus. No abnormal foci of restricted diffusion to suggest acute or subacute ischemia. Gray-white matter differentiation otherwise grossly maintained. No other visible areas of chronic cortical infarction. No obvious evidence for intracranial hemorrhage on this limited exam. No visible mass lesion, mass effect or midline shift. Ventricles normal size without hydrocephalus. No extra-axial fluid collection. Vascular: Major intracranial vascular flow voids are grossly maintained at the skull base. Skull and upper cervical spine: Craniocervical junction grossly within normal limits. No obvious focal marrow replacing lesion. No scalp soft tissue abnormality. Sinuses/Orbits: Prior bilateral ocular lens replacement. Globes and orbital soft tissues  demonstrate no definite acute abnormality. Mild scattered mucosal thickening about the ethmoidal air cells. Paranasal sinuses are otherwise largely clear. No significant mastoid effusion. Other: None.  MRA HEAD FI

## 2022-01-25 NOTE — ED Notes (Signed)
Admitting hospitalitis Dr Damita Dunnings informed pt failed swallows screen. Pt drowsy after administration of IV ativan. Pt did not receive her night time medications including Eliquis   ?

## 2022-01-25 NOTE — Progress Notes (Signed)
SLP Cancellation Note ? ?Patient Details ?Name: Cheyenne Frank ?MRN: [id number] ?DOB: October 02, 1940 ? ? ?Cancelled treatment:       Reason Eval/Treat Not Completed: Patient's level of consciousness (SLP consult received and appreciated. Chart review completed. Spoke with RN. Per RN, pt too lethargic for safe participation in clinical swallowing evaluation or for participation in cognitive-linguistic evaluation. Will continue efforts as appropriate.) ? ?Clyde Canterbury, M.S., CCC-SLP ?Speech-Language Pathologist ?[facility] ?[phone]) ? ?Woodroe Chen ?01/25/2022, 8:28 AM ?

## 2022-01-25 NOTE — Consult Note (Signed)
Neurology Consultation ?Reason for Consult: Aphasia ?Requesting Physician: Fritzi Mandes ? ?CC: Aphasia ? ?History is obtained from: Chart review, daughter by phone ? ?HPI: Cheyenne Frank is a 81 y.o. female with a past medical history significant for congestive heart failure (NYHA class III, chronic, diastolic), CKD, prior stroke (right thalamic, 2015, as well as an episode of aphasia in 2010 which lasted 30 minutes), hypertension, hyperlipidemia, type 2 diabetes, severe sleep apnea (CPAP nonadherent), seizure disorder (unclear semiology, off meds for several years), paroxysmal atrial fibrillation on Eliquis, drusen stage macular degeneration, chronic pain including lumbar postlaminectomy syndrome, morbid obesity, prior subdural hemorrhage (left-sided, occurred during her hospitalization for back surgery) ? ?She received 1 mg of Ativan in the ED for MRI, which was truncated secondary to her difficulty cooperating with examination, but did not reveal acute stroke.  She was subsequently somnolent but waking up a bit now. ? ?Additional history obtained from her daughter, listed as her first emergency contact.  Daughter notes that the patient at baseline has difficulty speaking once or twice a week usually associated with hypo or hyperglycemia (adapts to the 40s about once a month or so, and not infrequently is above the upper limit readable on her machine, but typical blood sugars are in the 200s).  There is potentially sometimes a correlation with stress as well, and the episodes typically resolve when her hypo or hyperglycemia have been addressed.  Overall the patient has been having a progressive decline.  She is incontinent at baseline but does not like to wear pull-ups due to yeast infections that are irritated by the  pullups.  She ambulates with a walker but requires the assistance of a person as well frequently.  Her daughter tells her pillbox but the patient frequently chooses not to take some of her  medications though she is typically compliant with her Eliquis.  She remains nonadherent to CPAP ? ?Per review of records in the EMR, she has been followed by [facility] for his severe obstructive sleep apnea and has been nonadherent to CPAP due to not tolerating the mask.  She was last seen by Dr. Claris Gladden on 06/27/2015 for stroke follow-up.  Her records indicate her left thalamic stroke occurred in the setting of postoperative sepsis from an infected surgical wound after back surgery.  At that time she was on Keppra for seizures but there is no seizure history described. Per Dr. Lisabeth Devoid notes, prolonged EEG in the hospital was negative for seizure activity.  Daughter reports that the patient also developed her left subdural hemorrhage during this hospitalization, but she is unaware of any seizure activity that occurred during the hospitalization, though that is when her Keppra was started.  After EEG in 2017 was obtained that was negative for history of seizure activity (though it did show bihemispheric slowing more pronounced in the left central/temporal region), she was taken off Keppra.  Her daughter does not note any significant change in the patient's behavior or somnolence after this medication was stopped and does not think she had any specific side effects from the [place] that she is aware of. ? ?EEG 03/2016 "This EEG was acquired with electrodes placed according to the 10-20 electrode placement system. The best background activity was 9.0 Hz. This activity is reactive to stimulation. Deeper stages of sleep were not identified. Bitemporal slowing was noted, however, was increased in the L central/temporal region. There were no interictal discharges. There were no electrographic seizures identified. No abnormal response to photic stimulation was present. Hyperventilation  was not performed. Impression: This EEG was obtained while awake and is abnormal due to: 1.) Bi-hemispheric slowing more pronounced in the L  central/temporal region." ? ? ?ROS: Unable to obtain due to altered mental status.  ? ?Past Medical History:  ?Diagnosis Date  ? AKI (acute kidney injury) ([place])   ? Anemia of chronic disease   ? Anxiety   ? Arthritis   ? Asthma   ? no problems since 1999 (after moving into a new home)  ? Chronic airway obstruction (HCC)   ? Complication of anesthesia   ? " feel like I'm dying after I wake up"  ? Diabetes mellitus without complication ([place])   ? Diverticulosis   ? Dysrhythmia   ? unknown type   ? Fibromyalgia   ? Flat back syndrome   ? GERD (gastroesophageal reflux disease)   ? Hx of transfusion 2011  ? Hypertension   ? Itching   ? Macular degeneration   ? Nocturia   ? Postoperative anemia due to acute blood loss 04/09/2013  ? Restless leg syndrome   ? Sleep apnea   ? Dx 1990's unable to wear c-pap -  ? Stroke [facility]) 2010  ? verbal aphasia x 30 min - resolved - no problem since then  ? ?Past Surgical History:  ?Procedure Laterality Date  ? ABDOMINAL HYSTERECTOMY    ? rt so  ? BACK SURGERY  2010 / 2011  ? BREAST REDUCTION SURGERY    ? BURR HOLE FOR SUBDURAL HEMATOMA  2015  ? cataracts removed    ? CHOLECYSTECTOMY  1990  ? DILATION AND CURETTAGE OF UTERUS    ? HERNIA REPAIR    ? POSTERIOR LAMINECTOMY / DECOMPRESSION LUMBAR SPINE    ? with T10-S1 fusion  ? TONSILLECTOMY    ? TOTAL KNEE ARTHROPLASTY Left 04/06/2013  ? Procedure: LEFT TOTAL KNEE ARTHROPLASTY;  Surgeon: Gearlean Alf, MD;  Location: [facility];  Service: Orthopedics;  Laterality: Left;  ? ?Current Outpatient Medications  ?Medication Instructions  ? acetaminophen (TYLENOL) 500-1,000 mg, Oral, Every 6 hours PRN  ? albuterol (VENTOLIN HFA) 108 (90 Base) MCG/ACT inhaler albuterol sulfate HFA 90 mcg/actuation aerosol inhaler ? INHALE 2 PUFFS INTO LUNGS 4 TIMES DAILY  ? apixaban (ELIQUIS) 5 mg, Oral, 2 times daily  ? aspirin EC 81 mg, Oral, Daily, MAY RESUME ON 12/31/20  ? carvedilol (COREG) 25 mg, Oral, 2 times daily  ? gabapentin (NEURONTIN) 300 mg, Oral, 2 times  daily  ? hydrALAZINE (APRESOLINE) 25 mg, Oral, 3 times daily PRN  ? insulin glargine (LANTUS) 64 Units, Subcutaneous, Daily at bedtime  ? insulin lispro (HUMALOG) 22 Units, Subcutaneous, 3 times daily with meals  ? liver oil-zinc oxide (DESITIN) 40 % ointment 1 application., Topical, As needed  ? losartan (COZAAR) 25 mg, Oral, Daily  ? Multiple Vitamin (MULTIVITAMIN WITH MINERALS) TABS tablet 1 tablet, Oral, Daily  ? omeprazole (PRILOSEC) 40 mg, Oral, Daily  ? oxybutynin (DITROPAN-XL) 10 mg, Oral, Daily  ? PARoxetine (PAXIL) 40 mg, Oral, Daily at bedtime  ? rosuvastatin (CRESTOR) 2.5 mg, Oral, Daily at bedtime  ? sitaGLIPtin (JANUVIA) 50 mg, Oral, Daily  ? torsemide (DEMADEX) 20 mg, Oral, Daily, Please start from 11/01/2021  ? traZODone (DESYREL) 50 mg, Oral, Daily at bedtime  ? umeclidinium bromide (INCRUSE ELLIPTA) 62.5 MCG/INH AEPB 1 puff  ? ? ? ?Family History  ?Problem Relation Age of Onset  ? COPD Sister   ?     had lung transplant  ?  COPD Brother   ? ? ?Social History:  reports that she has never smoked. She has never used smokeless tobacco. She reports that she does not drink alcohol and does not use drugs. ? ? ?Exam: ?Current vital signs: ?BP 140/67   Pulse 80   Temp 97.8 ?F (36.6 ?C) (Axillary)   Resp 20   SpO2 96%  ?Vital signs in last 24 hours: ?Temp:  [97.8 ?F (36.6 ?C)-99.7 ?F (37.6 ?C)] 97.8 ?F (36.6 ?C) (05/11 1100) ?Pulse Rate:  [74-98] 80 (05/11 1127) ?Resp:  [16-26] 20 (05/11 1127) ?BP: (140-200)/(67-100) 140/67 (05/11 1115) ?SpO2:  [91 %-100 %] 96 % (05/11 1127) ? ? ?Physical Exam  ?Constitutional: Appears well-developed and well-nourished.  ?Psych: Affect calm and cooperative, intermittently confused ?Eyes: No scleral injection ?HENT: No oropharyngeal obstruction.  ?MSK: no joint deformities.  ?Cardiovascular: Perfusing extremities well ?Respiratory: Mild wheezing ?GI: Protuberant.  Surgical scars noted.  Nontender overall soft palpation ?Skin: Increased chronic skin changes on the right leg  compared to the left, daughter reports chronic swelling on that side that is intermittent ? ?Neuro: ?Mental Status: ?Patient is sleepy but wakes to examiner's voice.  She follows some simple commands intermittently but a

## 2022-01-25 NOTE — ED Notes (Signed)
Pt removed purwick. Pt was cleaned and repositioned. Provided with new sheets, gown, and brief. Purwick replaced. Pt continues to be able to follow commands, continues to be drowsy, unable to complete full neuro assessment.  ?

## 2022-01-26 DIAGNOSIS — R29818 Other symptoms and signs involving the nervous system: Secondary | ICD-10-CM | POA: Diagnosis not present

## 2022-01-26 DIAGNOSIS — I5032 Chronic diastolic (congestive) heart failure: Secondary | ICD-10-CM | POA: Diagnosis not present

## 2022-01-26 DIAGNOSIS — R4701 Aphasia: Secondary | ICD-10-CM | POA: Diagnosis not present

## 2022-01-26 LAB — GLUCOSE, CAPILLARY
Glucose-Capillary: 173 mg/dL — ABNORMAL HIGH (ref 70–99)
Glucose-Capillary: 237 mg/dL — ABNORMAL HIGH (ref 70–99)
Glucose-Capillary: 258 mg/dL — ABNORMAL HIGH (ref 70–99)
Glucose-Capillary: 276 mg/dL — ABNORMAL HIGH (ref 70–99)

## 2022-01-26 MED ORDER — SODIUM CHLORIDE 0.9 % IV SOLN
750.0000 mg | Freq: Two times a day (BID) | INTRAVENOUS | Status: DC
  Administered 2022-01-26 – 2022-01-27 (×2): 750 mg via INTRAVENOUS
  Filled 2022-01-26 (×3): qty 7.5

## 2022-01-26 MED ORDER — SODIUM CHLORIDE 0.9 % IV SOLN
750.0000 mg | Freq: Once | INTRAVENOUS | Status: AC
  Administered 2022-01-26: 750 mg via INTRAVENOUS
  Filled 2022-01-26: qty 7.5

## 2022-01-26 NOTE — Progress Notes (Addendum)
Nutrition Brief Note ? ?Pt screened on the low braden report.  ? ?Wt Readings from Last 15 Encounters:  ?01/25/22 111.1 kg  ?10/30/21 117 kg  ?09/16/21 122.5 kg  ?05/21/21 117.5 kg  ?01/13/21 113.4 kg  ?12/27/20 108.9 kg  ?07/14/20 117.9 kg  ?07/10/20 117.9 kg  ?04/06/20 117.9 kg  ?11/13/19 113.4 kg  ?01/09/18 117.9 kg  ?03/30/17 113.4 kg  ?01/21/17 113.4 kg  ?02/03/16 114.8 kg  ?12/13/15 113.4 kg  ? ?Pt with medical history significant for Paroxysmal A-fib on Eliquis, prior stroke, HTN, insulin-dependent type 2 diabetes, sleep apnea, CKD stage IIIb, COPD, diastolic CHF presents with difficulty getting her words out similar to her prior strokes.   ? ?Pt admitted with CVA.  ? ?5/11- s/p BSE- dysphagia 3 diet with thin liquids ? ?Pt sleeping soundly at time of visit.  ? ?Reviewed wt hx; pt has experienced a 5% wt loss over the past 3 months, which is not significant for time frame.  ? ?Nutrition-Focused physical exam completed. Findings are no fat depletion, no muscle depletion, and mild edema.   ? ?Medications reviewed and include keppra.  ? ?Labs reviewed: CBGS: [id number] (inpatient orders for glycemic control are 0-5 units insulin aspart daily at bedtime, 0-9 units insulin aspart TID with meals, and 30 units insulin glargine-yfgn daily).   ? ?Body mass index is 39.53 kg/m?Marland Kitchen Patient meets criteria for obesity, class II based on current BMI.  ? ?Current diet order is dysphagia 3 with thin liquids, patient is consuming approximately n/a% of meals at this time. Labs and medications reviewed.  ? ?No nutrition interventions warranted at this time. If nutrition issues arise, please consult RD.  ? ?Loistine Chance, RD, LDN, CDCES ?Registered Dietitian II ?Certified Diabetes Care and Education Specialist ?Please refer to [facility] for RD and/or RD on-call/weekend/after hours pager   ?

## 2022-01-26 NOTE — Progress Notes (Addendum)
? ? ? ?Progress Note  ? ? ?Cheyenne Frank  [id number] DOB: 1941-03-13  DOA: 01/24/2022 ?PCP: Scot Dock, [facility]  ? ? ? ? ?Brief Narrative:  ? ? ?Medical records reviewed and are as summarized below: ? ?Cheyenne Frank is a 81 y.o. female with medical history significant for Paroxysmal A-fib on Eliquis, prior stroke, HTN, insulin-dependent type 2 diabetes, sleep apnea, CKD stage IIIb, COPD, diastolic CHF, who presented to the hospital with getting her words out.  Reportedly, BP was 232/102 when EMS picked her up. ? ? ? ?Assessment/Plan:  ? ?Principal Problem: ?  Aphasia ?Active Problems: ?  Acute focal neurological deficit - possible acute CVA ?  History of CVA (cerebrovascular accident) ?  Hypertensive urgency ?  Uncontrolled type 2 diabetes mellitus with hyperglycemia, with long-term current use of insulin ([place]) ?  Sleep apnea ?  Stage 3b chronic kidney disease (CKD) ([place]) ?  COPD (chronic obstructive pulmonary disease) ([place]) ?  Chronic diastolic CHF (congestive heart failure) ([place]) ?  Paroxysmal atrial fibrillation (HCC) ?  Acute CVA (cerebrovascular accident) ([place]) ? ? ? ? ?Body mass index is 39.53 kg/m?.  (Obesity) ? ?Aphasia, suspected seizures: Continue Keppra (dose increased).  No epileptiform activity on EEG.  No acute stroke on MRI brain.  Follow-up with neurologist for further recommendation. ? ?Hypertensive urgency: Continue antihypertensive ? ?Type II DM with hyperglycemia: Continue insulin glargine and NovoLog as needed ? ?Paroxysmal atrial fibrillation with history of stroke: Continue metoprolol and Eliquis ? ?Chronic diastolic CHF: Compensated.  Continue antihypertensives ? ?COPD: Stable.  Continue bronchodilators ? ?Other comorbidities include OSA, CKD stage IIIb, ? ?Diet Order   ? ?       ?  DIET DYS 3 Room service appropriate? Yes with Assist; Fluid consistency: Thin  Diet effective now       ?  ? ?  ?  ? ?   ? ? ? ? ? ? ? ? ? ? ? ?Consultants: ?Neurologist ? ?Procedures: ?None ? ? ? ?Medications:  ? ?  stroke: early stages of recovery book   Does not apply Once  ? apixaban  5 mg Oral BID  ? aspirin EC  81 mg Oral Daily  ? carvedilol  25 mg Oral BID  ? clotrimazole  1 Applicatorful Vaginal QHS  ? insulin aspart  0-5 Units Subcutaneous QHS  ? insulin aspart  0-9 Units Subcutaneous TID WC  ? insulin glargine-yfgn  30 Units Subcutaneous QHS  ? losartan  25 mg Oral Daily  ? multivitamin with minerals  1 tablet Oral Daily  ? oxybutynin  10 mg Oral Daily  ? pantoprazole  40 mg Oral Daily  ? PARoxetine  40 mg Oral QHS  ? rosuvastatin  2.5 mg Oral QHS  ? ?Continuous Infusions: ? levETIRAcetam    ? ? ? ?Anti-infectives (From admission, onward)  ? ? None  ? ?  ? ? ? ? ? ? ? ? ? ?Family Communication/Anticipated D/C date and plan/Code Status  ? ?DVT prophylaxis:  ?apixaban (ELIQUIS) tablet 5 mg  ? ?  Code Status: Full Code ? ?Family Communication: None ?Disposition Plan: To be determined ? ? ?Status is: Inpatient ?Remains inpatient appropriate because: Altered mental status, suspected seizures ? ? ? ? ? ? ?Subjective:  ? ?Interval events noted.  Patient is nonverbal at this time and cannot provide any history.  According to nursing staff and OT, patient was communicative earlier in the day. ? ?Objective:  ? ? ?Vitals:  ?  01/26/22 0547 01/26/22 0747 01/26/22 1225 01/26/22 1300  ?BP: (!) 171/74 (!) 172/63 (!) 152/60 (!) 182/64  ?Pulse: 74 73 70 67  ?Resp: 18 17 16 18   ?Temp: 97.8 ?F (36.6 ?C) 98.5 ?F (36.9 ?C) 97.9 ?F (36.6 ?C) 97.8 ?F (36.6 ?C)  ?TempSrc: Oral     ?SpO2: 99% 93% 98% 98%  ?Weight:      ? ?No data found. ? ? ?Intake/Output Summary (Last 24 hours) at 01/26/2022 1629 ?Last data filed at 01/26/2022 1500 ?Gross per 24 hour  ?Intake 200 ml  ?Output --  ?Net 200 ml  ? ?Filed Weights  ? 01/25/22 1300  ?Weight: 111.1 kg  ? ? ?Exam: ? ?GEN: NAD ?SKIN: No rash ?EYES: EOMI ?ENT: MMM ?CV: RRR ?PULM: CTA B ?ABD: soft, obese, NT,  +BS ?CNS: Alert but nonverbal, confused.  She moves all extremities spontaneously. ?EXT: No edema or tenderness ? ? ? ?  ? ? ?Data Reviewed:  ? ?I have personally reviewed following labs and imaging studies: ? ?Labs: ?Labs show the following:  ? ?Basic Metabolic Panel: ?Recent Labs  ?Lab 01/24/22 ?1830 01/25/22 ?0423  ?NA 139  --   ?K 3.4*  --   ?CL 100  --   ?CO2 29  --   ?GLUCOSE 390*  --   ?BUN 25*  --   ?CREATININE 1.30*  --   ?CALCIUM 8.8*  --   ?MG  --  2.1  ? ?GFR ?Estimated Creatinine Clearance: 43.6 mL/min (A) (by C-G formula based on SCr of 1.3 mg/dL (H)). ?Liver Function Tests: ?Recent Labs  ?Lab 01/24/22 ?1830  ?AST 33  ?ALT 21  ?ALKPHOS 82  ?BILITOT 0.9  ?PROT 6.5  ?ALBUMIN 3.2*  ? ?No results for input(s): LIPASE, AMYLASE in the last 168 hours. ?No results for input(s): AMMONIA in the last 168 hours. ?Coagulation profile ?No results for input(s): INR, PROTIME in the last 168 hours. ? ?CBC: ?Recent Labs  ?Lab 01/24/22 ?1830  ?WBC 8.2  ?NEUTROABS 6.2  ?HGB 12.8  ?HCT 39.9  ?MCV 90.1  ?PLT 159  ? ?Cardiac Enzymes: ?No results for input(s): CKTOTAL, CKMB, CKMBINDEX, TROPONINI in the last 168 hours. ?BNP (last 3 results) ?No results for input(s): PROBNP in the last 8760 hours. ?CBG: ?Recent Labs  ?Lab 01/25/22 ?1234 01/25/22 ?1604 01/25/22 ?2028 01/26/22 ?0747 01/26/22 ?1226  ?[place] ?D-Dimer: ?No results for input(s): DDIMER in the last 72 hours. ?Hgb A1c: ?No results for input(s): HGBA1C in the last 72 hours. ?Lipid Profile: ?Recent Labs  ?  01/25/22 ?0423  ?CHOL 181  ?HDL 33*  ?LDLCALC 107*  ?TRIG 204*  ?CHOLHDL 5.5  ? ?Thyroid function studies: ?No results for input(s): TSH, T4TOTAL, T3FREE, THYROIDAB in the last 72 hours. ? ?Invalid input(s): FREET3 ?Anemia work up: ?No results for input(s): VITAMINB12, FOLATE, FERRITIN, TIBC, IRON, RETICCTPCT in the last 72 hours. ?Sepsis Labs: ?Recent Labs  ?Lab 01/24/22 ?1830  ?WBC 8.2  ? ? ?Microbiology ?No results found for this or any  previous visit (from the past 240 hour(s)). ? ?Procedures and diagnostic studies: ? ?CT Head Wo Contrast ? ?Result Date: 01/24/2022 ?CLINICAL DATA:  Stroke suspected neuro deficit EXAM: CT HEAD WITHOUT CONTRAST TECHNIQUE: Contiguous axial images were obtained from the base of the skull through the vertex without intravenous contrast. RADIATION DOSE REDUCTION: This exam was performed according to the departmental dose-optimization program which includes automated exposure control, adjustment of the mA and/or kV according to patient size  and/or use of iterative reconstruction technique. COMPARISON:  01/13/2021 FINDINGS: Brain: No evidence of acute infarction, hemorrhage, cerebral edema, mass, mass effect, or midline shift. No hydrocephalus or extra-axial fluid collection. Redemonstrated left thalamic lacunar infarct. Periventricular white matter changes, likely the sequela of chronic small vessel ischemic disease. Cerebral volume loss is likely within normal limits for age. Vascular: No hyperdense vessel. Atherosclerotic calcifications in the intracranial carotid and vertebral arteries. Skull: Hyperostosis frontalis. Left frontal burr hole. Negative for fracture or focal lesion. Sinuses/Orbits: No acute finding. Other: The mastoid air cells are well aerated. IMPRESSION: No acute intracranial process. Electronically Signed   By: Merilyn Baba M.D.   On: 01/24/2022 18:53  ? ?MR ANGIO HEAD WO CONTRAST ? ?Result Date: 01/24/2022 ?CLINICAL DATA:  Initial evaluation for neuro deficit, stroke suspected. EXAM: MRI HEAD WITHOUT CONTRAST MRA HEAD WITHOUT CONTRAST MRA NECK WITHOUT AND WITH CONTRAST TECHNIQUE: Multiplanar, multi-echo pulse sequences of the brain and surrounding structures were acquired without intravenous contrast. Angiographic images of the Circle of Willis were acquired using MRA technique without intravenous contrast. Angiographic images of the neck were acquired using MRA technique without and with intravenous  contrast. Carotid stenosis measurements (when applicable) are obtained utilizing NASCET criteria, using the distal internal carotid diameter as the denominator. CONTRAST:  None. COMPARISON:  Comparison made with prior CT from earlier the same day. FINDINGS: MRI HEAD FINDINGS Bra

## 2022-01-26 NOTE — Progress Notes (Signed)
Subjective: ?Refused CPAP overnight ?Complaining of headache, no leg pain, no stomach pain ? ?Objective: ?Vital signs in last 24 hours: ?Temp:  [97.8 ?F (36.6 ?C)-98.6 ?F (37 ?C)] 97.9 ?F (36.6 ?C) (05/12 1225) ?Pulse Rate:  [70-83] 70 (05/12 1225) ?Resp:  [16-20] 16 (05/12 1225) ?BP: (150-172)/(59-74) 152/60 (05/12 1225) ?SpO2:  [93 %-99 %] 98 % (05/12 1225) ?Weight:  [111.1 kg] 111.1 kg (05/11 1300) ? ? ?BP 182/64 during my eval ? ? ?Exam:  ?General: In bed, clutching her head predominantly the left side but occasionally the right ?Pulmonary: Intermittent wheezing ?Cardiac: Perfusing extremities well ?Abdomen: Protuberant, soft, nontender ? ?Mental status: Intermittently she is nonverbal, but she at times converses briefly, for example she tells me "You're pretty."  She appropriately answers yes/no questions about pain consistently.  She tells me her name is Cheyenne Frank but does not answer other orientation questions.  She repeats "today is a sunny day" and intermittently names thumb.  She follows simple commands such as stick out her tongue and show me your thumb ?Cranial nerves: Visual fields full to confrontation, correctly count fingers.  Pupils equal round reactive to light, EOMI, face symmetric, tongue midline ?Sensory/motor: Using all 4 extremities freely and equally, able to pull blanket up over her shoulders, reacts equally to light touch in all 4 extremities.  Moves both legs antigravity. ? ? ?Lab Results: ? ?Basic Metabolic Panel: ?Recent Labs  ?Lab 01/24/22 ?1830 01/25/22 ?0423  ?NA 139  --   ?K 3.4*  --   ?CL 100  --   ?CO2 29  --   ?GLUCOSE 390*  --   ?BUN 25*  --   ?CREATININE 1.30*  --   ?CALCIUM 8.8*  --   ?MG  --  2.1  ? ? ?CBC: ?Recent Labs  ?Lab 01/24/22 ?1830  ?WBC 8.2  ?NEUTROABS 6.2  ?HGB 12.8  ?HCT 39.9  ?MCV 90.1  ?PLT 159  ? ? ?Coagulation Studies: ?No results for input(s): LABPROT, INR in the last 72 hours.  ? ? ? ?Studies/Results: ? ? ?[facility] Carotid Bilateral [facility] and [facility] only) ? ?Result  Date: 01/25/2022 ?CLINICAL DATA:  Initial evaluation for neuro deficit, stroke. EXAM: BILATERAL CAROTID DUPLEX ULTRASOUND TECHNIQUE: Wallace CullensGray scale imaging, color Doppler and duplex ultrasound were performed of bilateral carotid and vertebral arteries in the neck. COMPARISON:  Prior brain MRI from 01/24/2022. FINDINGS: Criteria: Quantification of carotid stenosis is based on velocity parameters that correlate the residual internal carotid diameter with NASCET-based stenosis levels, using the diameter of the distal internal carotid lumen as the denominator for stenosis measurement. The following velocity measurements were obtained: RIGHT ICA: 73/13 cm/sec CCA: 75/9 cm/sec SYSTOLIC ICA/CCA RATIO:  1.0 ECA: 66 cm/sec LEFT ICA: 99/21 cm/sec CCA: 81/10 cm/sec SYSTOLIC ICA/CCA RATIO:  1.2 ECA: 106 cm/sec RIGHT CAROTID ARTERY: Scattered intimal atherosclerotic irregularity within the visualized right CCA without hemodynamically significant stenosis. Mild heterogeneous atherosclerotic plaque present about the right carotid bulb extending into the proximal right ICA. No elevation in peak systolic velocity to suggest hemodynamically significant stenosis. Visualized right ICA patent distally without stenosis. RIGHT VERTEBRAL ARTERY:  Patent with antegrade flow. LEFT CAROTID ARTERY: Mild smooth intimal thickening within the visualized left CCA without stenosis. Mild for age atheromatous irregularity about the left carotid bulb, extending into the proximal left ICA. No elevation in peak systolic velocity to suggest hemodynamically significant stenosis. Visualized left ICA widely patent distally without stenosis. LEFT VERTEBRAL ARTERY:  Patent with antegrade flow. IMPRESSION: 1. Mild atherosclerotic irregularity and  plaque about the bilateral carotid bulbs, extending into the proximal internal carotid arteries, right greater than left, but with no evidence for hemodynamically significant stenosis. 2. Patent antegrade flow within both  vertebral arteries. Electronically Signed   By: Rise Mu M.D.   On: 01/25/2022 02:47  ? ?EEG adult ? ?Result Date: 01/25/2022 ?Charlsie Quest, MD     01/25/2022  6:58 PM Patient Name: Cheyenne Frank MRN: [id number] Epilepsy Attending: Charlsie Quest Referring Physician/Provider: Gordy Councilman, MD Date: 01/25/2022 Duration: 22.31 mins Patient history: 81 y.o. female with medical history significant for Paroxysmal A-fib on Eliquis, prior stroke presenting to the ED with difficulty getting her words out similar to her prior strokes. EEG to evaluate for seizure Level of alertness: Awake, asleep AEDs during EEG study: LEV Technical aspects: This EEG study was done with scalp electrodes positioned according to the 10-20 International system of electrode placement. Electrical activity was acquired at a sampling rate of 500Hz  and reviewed with a high frequency filter of 70Hz  and a low frequency filter of 1Hz . EEG data were recorded continuously and digitally stored. Description: The posterior dominant rhythm consists of 8-9 Hz activity of moderate voltage (25-35 uV) seen predominantly in posterior head regions, symmetric and reactive to eye opening and eye closing. Sleep was characterized by vertex waves, sleep spindles (12 to 14 Hz), maximal frontocentral region. EEG showed intermittent generalized 3 to 6 Hz theta-delta slowing.  Hyperventilation and photic stimulation were not performed.   ABNORMALITY - Intermittent slow, generalized IMPRESSION: This study is suggestive of mild diffuse encephalopathy, nonspecific etiology. No seizures or epileptiform discharges were seen throughout the recording. Priyanka   ? ?Medications: ? ?Current Facility-Administered Medications:  ?   stroke: early stages of recovery book, , Does not apply, Once, , MD ?  acetaminophen (TYLENOL) tablet 650 mg, 650 mg, Oral, Q4H PRN **OR** acetaminophen (TYLENOL) 160 MG/5ML solution 650 mg, 650 mg, Per Tube, Q4H  PRN **OR** acetaminophen (TYLENOL) suppository 650 mg, 650 mg, Rectal, Q4H PRN, , MD ?  albuterol (PROVENTIL) (2.5 MG/3ML) 0.083% nebulizer solution 2.5 mg, 2.5 mg, Inhalation, Q4H PRN, Annabelle Harman, MD ?  apixaban Andris Baumann) tablet 5 mg, 5 mg, Oral, BID, Andris Baumann, MD, 5 mg at 01/26/22 0905 ?  aspirin EC tablet 81 mg, 81 mg, Oral, Daily, Everlene Balls, MD, 81 mg at 01/26/22 0905 ?  carvedilol (COREG) tablet 25 mg, 25 mg, Oral, BID, 03/28/22, MD, 25 mg at 01/26/22 0905 ?  clotrimazole (GYNE-LOTRIMIN) vaginal cream 1 Applicatorful, 1 Applicatorful, Vaginal, QHS, 03/28/22, MD, 1 Applicatorful at 01/25/22 2157 ?  hydrALAZINE (APRESOLINE) tablet 25 mg, 25 mg, Oral, TID PRN, Arnaldo Natal, MD, 25 mg at 01/25/22 1329 ?  insulin aspart (novoLOG) injection 0-5 Units, 0-5 Units, Subcutaneous, QHS, 2158, MD, 3 Units at 01/25/22 2157 ?  insulin aspart (novoLOG) injection 0-9 Units, 0-9 Units, Subcutaneous, TID WC, Enedina Finner, MD, 2 Units at 01/26/22 [phone] ?  insulin glargine-yfgn (SEMGLEE) injection 30 Units, 30 Units, Subcutaneous, QHS, Enedina Finner, MD, 30 Units at 01/25/22 2157 ?  levETIRAcetam (KEPPRA) IVPB 500 mg/100 mL premix, 500 mg, Intravenous, Q12H, Amunique Neyra L, MD, Last Rate: 400 mL/hr at 01/26/22 0916, 500 mg at 01/26/22 0916 ?  losartan (COZAAR) tablet 25 mg, 25 mg, Oral, Daily, 2158, MD, 25 mg at 01/26/22 03/28/22 ?  multivitamin with minerals tablet 1 tablet, 1 tablet, Oral, Daily, Enedina Finner, MD, 1  tablet at 01/26/22 0905 ?  oxybutynin (DITROPAN-XL) 24 hr tablet 10 mg, 10 mg, Oral, Daily, Enedina Finner, MD, 10 mg at 01/26/22 0910 ?  pantoprazole (PROTONIX) EC tablet 40 mg, 40 mg, Oral, Daily, Enedina Finner, MD, 40 mg at 01/26/22 0905 ?  PARoxetine (PAXIL) tablet 40 mg, 40 mg, Oral, QHS, Andris Baumann, MD, 40 mg at 01/25/22 2158 ?  rosuvastatin (CRESTOR) tablet 2.5 mg, 2.5 mg, Oral, QHS, Andris Baumann, MD, 2.5 mg at 01/25/22 2158 ? ?Assessment/Plan: ?Patient  continues to have intermittent aphasia.  This morning she was much better than she has been.  While EEG was negative for seizure activity, I do think focal seizures remain on the differential.  This may

## 2022-01-26 NOTE — Progress Notes (Addendum)
PT Cancellation Note ? ?Patient Details ?Name: TARSHIA KOT ?MRN: [id number] ?DOB: [birth year] ? ? ?Cancelled Treatment:    Reason Eval/Treat Not Completed: Other (comment) ? ?Attempted to see pt at 10:45 AM but NT assisting pt with bathing.  ? ?Re-attempted to see pt at 11:16am but pt with nonsensical verbal communication with NT still in room reporting this is extremely different compared to how she was over the past 30 minutes as pt was previously able to carry on full conversations. PT notified nurse & MD & PT evaluation deferred at this time. Will f/u as able. ? ?12:22pm MD recommends to hold PT evaluation today. Will f/u on pt's status tomorrow. ? ? ?Aleda Grana, PT, DPT ?01/26/22, 12:22 PM ? ? ?Sandi Mariscal ?01/26/2022, 12:11 PM ?

## 2022-01-26 NOTE — Evaluation (Signed)
Occupational Therapy Evaluation ?Patient Details ?Name: Cheyenne Frank ?MRN: [id number] ?DOB: [birth year] ?Today's Date: 01/26/2022 ? ? ?History of Present Illness 81 y.o. female with medical history significant for Obesity, Paroxysmal A-fib on Eliquis, prior strokes, AKI, GERD, Fibromyalgia, HTN, insulin-dependent type 2 diabetes, sleep apnea, CKD stage IIIb, COPD, diastolic CHF presenting to the ED with some difficulty getting her words out, similar to her prior strokes.   Pt was recently admitted to this facility w/ acute on chronic CHF; worsening shortness of breath, weight gain, leg edema that admit.  ? ?Clinical Impression ?  ?Pt was seen for OT evaluation this date. Prior to hospital admission, pt was ambulating short household distances with a  RW and sometimes with assist from family. She also required assist for all LB ADL, walk-in tub transfers, and IADL tasks. Pt lives with a daughter. Another daughter present for session and supportive. Currently pt demonstrates impairments as described below (See OT problem list) which functionally limit her ability to perform ADL/self-care tasks. Pt currently requires MIN-MOD A for bed mobility, MIN-MOD A +2 for safety with VC for RW mgt for STS transfer, and MIN A for lateral steps EOB with RW. Pt alert, oriented x2, follows simple commands well, and pleasantly conversational during session. Set up and supervision to wash her face while seated EOB. Pt would benefit from skilled OT services to address noted impairments and functional limitations (see below for any additional details) in order to maximize safety and independence while minimizing falls risk and caregiver burden. Upon hospital discharge, recommend STR to maximize pt safety and return to PLOF.    ? ?Recommendations for follow up therapy are one component of a multi-disciplinary discharge planning process, led by the attending physician.  Recommendations may be updated based on patient status, additional  functional criteria and insurance authorization.  ? ?Follow Up Recommendations ? Skilled nursing-short term rehab (<3 hours/day)  ?  ?Assistance Recommended at Discharge Frequent or constant Supervision/Assistance  ?Patient can return home with the following Two people to help with walking and/or transfers;Assistance with cooking/housework;Assist for transportation;Help with stairs or ramp for entrance;A lot of help with bathing/dressing/bathroom ? ?  ?Functional Status Assessment ? Patient has had a recent decline in their functional status and demonstrates the ability to make significant improvements in function in a reasonable and predictable amount of time.  ?Equipment Recommendations ? Other (comment) (defer to next venue)  ?  ?Recommendations for Other Services   ? ? ?  ?Precautions / Restrictions Precautions ?Precautions: Fall ?Restrictions ?Weight Bearing Restrictions: No  ? ?  ? ?Mobility Bed Mobility ?Overal bed mobility: Needs Assistance ?Bed Mobility: Supine to Sit, Sit to Supine ?  ?  ?Supine to sit: Min assist, HOB elevated ?Sit to supine: Mod assist ?  ?General bed mobility comments: MIN A for trunk elevation to fully come upright, MOD A for BLE mgt back to bed ?  ? ?Transfers ?Overall transfer level: Needs assistance ?Equipment used: Rolling walker (2 wheels) ?Transfers: Sit to/from Stand ?Sit to Stand: Mod assist, Min assist, +2 safety/equipment ?  ?  ?  ?  ?  ?General transfer comment: VC for hand placement on RW ?  ? ?  ?Balance Overall balance assessment: Needs assistance ?Sitting-balance support: Single extremity supported, Feet supported ?Sitting balance-Leahy Scale: Fair ?  ?  ?Standing balance support: Bilateral upper extremity supported, Reliant on assistive device for balance ?Standing balance-Leahy Scale: Poor ?  ?  ?  ?  ?  ?  ?  ?  ?  ?  ?  ?  ?   ? ?  ADL either performed or assessed with clinical judgement  ? ?ADL Overall ADL's : Needs assistance/impaired ?  ?  ?Grooming:  Sitting;Wash/dry face;Set up;Supervision/safety ?  ?  ?  ?  ?  ?  ?  ?Lower Body Dressing: Sit to/from stand;Maximal assistance ?  ?  ?  ?Toileting- Clothing Manipulation and Hygiene: Maximal assistance ?  ?  ?  ?  ?General ADL Comments: Pt requires MAX  A for LB ADL, MIN A for UB ADL, MIN-MOD A for ADL transfers with +2 for safety, MAX A for pericare and managing towel between her legs due to hx incontinence (unable to tolerate briefs)  ? ? ? ?Vision   ?   ?   ?Perception   ?  ?Praxis   ?  ? ?Pertinent Vitals/Pain Pain Assessment ?Pain Assessment: No/denies pain  ? ? ? ?Hand Dominance Right ?  ?Extremity/Trunk Assessment Upper Extremity Assessment ?Upper Extremity Assessment: Generalized weakness ?  ?Lower Extremity Assessment ?Lower Extremity Assessment: Generalized weakness ?  ?  ?  ?Communication Communication ?Communication: HOH ?  ?Cognition Arousal/Alertness: Awake/alert ?Behavior During Therapy: [facility] [employer] [facility] for tasks assessed/performed ?Overall Cognitive Status: Impaired/Different from baseline ?Area of Impairment: Orientation, Problem solving ?  ?  ?  ?  ?  ?  ?  ?  ?Orientation Level: Disoriented to, Time ?  ?  ?  ?  ?  ?Problem Solving: Slow processing, Requires verbal cues ?  ?  ?  ?General Comments  On room air at start of session SpO2 87-88%, back on 2L improves to 90-97% based on exertion - RN aware ? ?  ?Exercises   ?  ?Shoulder Instructions    ? ? ?Home Living Family/patient expects to be discharged to:: Private residence ?Living Arrangements: Children ?Available Help at Discharge: Family ?Type of Home: House ?Home Access: Stairs to enter ?Entrance Stairs-Number of Steps: 5 ?Entrance Stairs-Rails: Right ?Home Layout: Two level;Able to live on main level with bedroom/bathroom ?  ?  ?Bathroom Shower/Tub: Other (comment) (walk in tub) ?  ?Bathroom Toilet: Handicapped height ?Bathroom Accessibility: Yes ?  ?Home Equipment: Conservation officer, nature (2 wheels);Wheelchair - manual;BSC/3in1;Shower seat - built in;Other  (comment) ?  ?  ?  ? ?  ?Prior Functioning/Environment Prior Level of Function : Needs assist ?  ?  ?  ?  ?  ?  ?Mobility Comments: amb w/ RW short household distances sometimes wiht assist from dtr ?ADLs Comments: assist for walk-in tub transfers, LB ADL, incontinent but doesn't wear briefs 2/2 irritation to skin folds ?  ? ?  ?  ?OT Problem List: Decreased strength;Decreased cognition;Decreased activity tolerance;Impaired balance (sitting and/or standing);Decreased knowledge of use of DME or AE;Obesity ?  ?   ?OT Treatment/Interventions: Self-care/ADL training;Therapeutic exercise;Therapeutic activities;Cognitive remediation/compensation;DME and/or AE instruction;Patient/family education;Balance training  ?  ?OT Goals(Current goals can be found in the care plan section) Acute Rehab OT Goals ?Patient Stated Goal: get better and go home ?OT Goal Formulation: With patient/family ?Time For Goal Achievement: 02/09/22 ?Potential to Achieve Goals: Good ?ADL Goals ?Pt Will Perform Lower Body Dressing: with min assist;sit to/from stand ?Pt Will Transfer to Toilet: with min assist;ambulating;bedside commode (LRAD) ?Pt Will Perform Toileting - Clothing Manipulation and hygiene: with set-up;with min guard assist;sit to/from stand  ?OT Frequency: Min 2X/week ?  ? ?Co-evaluation   ?  ?  ?  ?  ? ?  ?AM-PAC OT "6 Clicks" Daily Activity     ?Outcome Measure Help from another person eating meals?: None ?Help from another person  taking care of personal grooming?: A Little ?Help from another person toileting, which includes using toliet, bedpan, or urinal?: A Lot ?Help from another person bathing (including washing, rinsing, drying)?: A Lot ?Help from another person to put on and taking off regular upper body clothing?: A Little ?Help from another person to put on and taking off regular lower body clothing?: A Lot ?6 Click Score: 16 ?  ?End of Session Nurse Communication: Mobility status ? ?Activity Tolerance: Patient tolerated  treatment well ?Patient left: in bed;with call bell/phone within reach;with bed alarm set;with family/visitor present ? ?OT Visit Diagnosis: Other abnormalities of gait and mobility (R26.89);Muscle weakness (generalized) (M62.81);

## 2022-01-27 DIAGNOSIS — I160 Hypertensive urgency: Secondary | ICD-10-CM | POA: Diagnosis not present

## 2022-01-27 DIAGNOSIS — R4701 Aphasia: Secondary | ICD-10-CM | POA: Diagnosis not present

## 2022-01-27 LAB — BASIC METABOLIC PANEL
Anion gap: 5 (ref 5–15)
BUN: 18 mg/dL (ref 8–23)
CO2: 32 mmol/L (ref 22–32)
Calcium: 8.7 mg/dL — ABNORMAL LOW (ref 8.9–10.3)
Chloride: 104 mmol/L (ref 98–111)
Creatinine, Ser: 1.07 mg/dL — ABNORMAL HIGH (ref 0.44–1.00)
GFR, Estimated: 53 mL/min — ABNORMAL LOW (ref >60)
Glucose, Bld: 148 mg/dL — ABNORMAL HIGH (ref 70–99)
Potassium: 3.4 mmol/L — ABNORMAL LOW (ref 3.5–5.1)
Sodium: 141 mmol/L (ref 135–145)

## 2022-01-27 LAB — GLUCOSE, CAPILLARY
Glucose-Capillary: 151 mg/dL — ABNORMAL HIGH (ref 70–99)
Glucose-Capillary: 216 mg/dL — ABNORMAL HIGH (ref 70–99)
Glucose-Capillary: 282 mg/dL — ABNORMAL HIGH (ref 70–99)
Glucose-Capillary: 342 mg/dL — ABNORMAL HIGH (ref 70–99)

## 2022-01-27 LAB — MAGNESIUM: Magnesium: 2.1 mg/dL (ref 1.7–2.4)

## 2022-01-27 LAB — PHOSPHORUS: Phosphorus: 3.2 mg/dL (ref 2.5–4.6)

## 2022-01-27 MED ORDER — HALOPERIDOL LACTATE 5 MG/ML IJ SOLN
5.0000 mg | Freq: Four times a day (QID) | INTRAMUSCULAR | Status: DC | PRN
  Filled 2022-01-27 (×2): qty 1

## 2022-01-27 MED ORDER — LEVETIRACETAM 750 MG PO TABS
750.0000 mg | ORAL_TABLET | Freq: Two times a day (BID) | ORAL | Status: DC
  Administered 2022-01-27 – 2022-01-30 (×6): 750 mg via ORAL
  Filled 2022-01-27 (×6): qty 1

## 2022-01-27 NOTE — Progress Notes (Signed)
? ? ? ?Progress Note  ? ? ?Cheyenne Frank  [id number] DOB: [birth year]  DOA: 01/24/2022 ?PCP: [facility], [facility]  ? ? ? ? ?Brief Narrative:  ? ? ?Medical records reviewed and are as summarized below: ? ?Cheyenne Frank is a 81 y.o. female with medical history significant for Paroxysmal A-fib on Eliquis, prior stroke, HTN, insulin-dependent type 2 diabetes, sleep apnea, CKD stage IIIb, COPD, diastolic CHF, who presented to the hospital with getting her words out.  Reportedly, BP was 232/102 when EMS picked her up. ? ? ? ?Assessment/Plan:  ? ?Principal Problem: ?  Aphasia ?Active Problems: ?  Acute focal neurological deficit - possible acute CVA ?  History of CVA (cerebrovascular accident) ?  Hypertensive urgency ?  Uncontrolled type 2 diabetes mellitus with hyperglycemia, with long-term current use of insulin ([place]) ?  Sleep apnea ?  Stage 3b chronic kidney disease (CKD) ([place]) ?  COPD (chronic obstructive pulmonary disease) ([place]) ?  Chronic diastolic CHF (congestive heart failure) ([place]) ?  Paroxysmal atrial fibrillation (HCC) ?  Acute CVA (cerebrovascular accident) ([place]) ? ? ? ? ?Body mass index is 39.53 kg/m?.  (Obesity) ? ? ?Aphasia, suspected seizures: Continue Keppra.  No epileptiform activity on EEG.  No acute stroke on MRI brain.  Follow-up with neurologist for further recommendation. ? ?Hypertensive urgency: Continue antihypertensive ? ?Type II DM with hyperglycemia: Continue insulin glargine and NovoLog as needed ? ?Paroxysmal atrial fibrillation with history of stroke: Continue metoprolol and Eliquis ? ?Chronic diastolic CHF: Compensated.  Continue antihypertensives ? ?COPD: Stable.  Continue bronchodilators ? ?Other comorbidities include OSA, CKD stage IIIb, ? ?Diet Order   ? ?       ?  DIET DYS 3 Room service appropriate? Yes with Assist; Fluid consistency: Thin  Diet effective now       ?  ? ?  ?  ? ?  ? ? ? ? ? ? ? ? ? ? ? ?Consultants: ?Neurologist ? ?Procedures: ?None ? ? ? ?Medications:   ? ?  stroke: early stages of recovery book   Does not apply Once  ? apixaban  5 mg Oral BID  ? aspirin EC  81 mg Oral Daily  ? carvedilol  25 mg Oral BID  ? insulin aspart  0-5 Units Subcutaneous QHS  ? insulin aspart  0-9 Units Subcutaneous TID WC  ? insulin glargine-yfgn  30 Units Subcutaneous QHS  ? levETIRAcetam  750 mg Oral BID  ? losartan  25 mg Oral Daily  ? multivitamin with minerals  1 tablet Oral Daily  ? oxybutynin  10 mg Oral Daily  ? pantoprazole  40 mg Oral Daily  ? PARoxetine  40 mg Oral QHS  ? rosuvastatin  2.5 mg Oral QHS  ? ?Continuous Infusions: ? ? ? ? ?Anti-infectives (From admission, onward)  ? ? None  ? ?  ? ? ? ? ? ? ? ? ? ?Family Communication/Anticipated D/C date and plan/Code Status  ? ?DVT prophylaxis:  ?apixaban (ELIQUIS) tablet 5 mg  ? ?  Code Status: Full Code ? ?Family Communication: Plan discussed with the daughter at the bedside ?Disposition Plan: To be determined ? ? ?Status is: Inpatient ?Remains inpatient appropriate because: Altered mental status, suspected seizures ? ? ? ? ? ? ?Subjective:  ? ?Interval events noted.  Patient was very agitated earlier today.  Her daughter was at the bedside.  Her nurse was also at the bedside during this encounter. ? ?Objective:  ? ? ?Vitals:  ?  01/27/22 0011 01/27/22 0540 01/27/22 0845 01/27/22 1209  ?BP: (!) 160/59 (!) 154/61 (!) 155/72 (!) 156/79  ?Pulse: 64 67 80 82  ?Resp: 20 20 18 18   ?Temp: 98.4 ?F (36.9 ?C) 98.1 ?F (36.7 ?C) 98.6 ?F (37 ?C) 98.2 ?F (36.8 ?C)  ?TempSrc: Oral Oral    ?SpO2: 99% 95% 98% 93%  ?Weight:      ? ?No data found. ? ? ?Intake/Output Summary (Last 24 hours) at 01/27/2022 1532 ?Last data filed at 01/27/2022 [id number] ?Gross per 24 hour  ?Intake 347.3 ml  ?Output 600 ml  ?Net -252.7 ml  ? ?Filed Weights  ? 01/25/22 1300  ?Weight: 111.1 kg  ? ? ?Exam: ? ?GEN: NAD ?SKIN: No rash ?EYES: EOMI ?ENT: MMM ?CV: RRR ?PULM: CTA B ?ABD: soft, obese, NT, +BS ?CNS: Alert, verbal  but confused, non focal ?EXT: No edema or  tenderness ? ? ? ?  ? ? ?Data Reviewed:  ? ?I have personally reviewed following labs and imaging studies: ? ?Labs: ?Labs show the following:  ? ?Basic Metabolic Panel: ?Recent Labs  ?Lab 01/24/22 ?1830 01/25/22 ?0423 01/27/22 ?[id number]  ?NA 139  --  141  ?K 3.4*  --  3.4*  ?CL 100  --  104  ?CO2 29  --  32  ?GLUCOSE 390*  --  148*  ?BUN 25*  --  18  ?CREATININE 1.30*  --  1.07*  ?CALCIUM 8.8*  --  8.7*  ?MG  --  2.1 2.1  ?PHOS  --   --  3.2  ? ?GFR ?Estimated Creatinine Clearance: 53 mL/min (A) (by C-G formula based on SCr of 1.07 mg/dL (H)). ?Liver Function Tests: ?Recent Labs  ?Lab 01/24/22 ?1830  ?AST 33  ?ALT 21  ?ALKPHOS 82  ?BILITOT 0.9  ?PROT 6.5  ?ALBUMIN 3.2*  ? ?No results for input(s): LIPASE, AMYLASE in the last 168 hours. ?No results for input(s): AMMONIA in the last 168 hours. ?Coagulation profile ?No results for input(s): INR, PROTIME in the last 168 hours. ? ?CBC: ?Recent Labs  ?Lab 01/24/22 ?1830  ?WBC 8.2  ?NEUTROABS 6.2  ?HGB 12.8  ?HCT 39.9  ?MCV 90.1  ?PLT 159  ? ?Cardiac Enzymes: ?No results for input(s): CKTOTAL, CKMB, CKMBINDEX, TROPONINI in the last 168 hours. ?BNP (last 3 results) ?No results for input(s): PROBNP in the last 8760 hours. ?CBG: ?Recent Labs  ?Lab 01/26/22 ?1226 01/26/22 ?1633 01/26/22 ?2035 01/27/22 ?[id number] 01/27/22 ?1210  ?GLUCAP 237* 258* 276* 151* 216*  ? ?D-Dimer: ?No results for input(s): DDIMER in the last 72 hours. ?Hgb A1c: ?No results for input(s): HGBA1C in the last 72 hours. ?Lipid Profile: ?Recent Labs  ?  01/25/22 ?0423  ?CHOL 181  ?HDL 33*  ?LDLCALC 107*  ?TRIG 204*  ?CHOLHDL 5.5  ? ?Thyroid function studies: ?No results for input(s): TSH, T4TOTAL, T3FREE, THYROIDAB in the last 72 hours. ? ?Invalid input(s): FREET3 ?Anemia work up: ?No results for input(s): VITAMINB12, FOLATE, FERRITIN, TIBC, IRON, RETICCTPCT in the last 72 hours. ?Sepsis Labs: ?Recent Labs  ?Lab 01/24/22 ?1830  ?WBC 8.2  ? ? ?Microbiology ?No results found for this or any previous visit (from the  past 240 hour(s)). ? ?Procedures and diagnostic studies: ? ?EEG adult ? ?Result Date: 01/25/2022 ?Lora Havens, MD     01/25/2022  6:58 PM Patient Name: ISOLDE URDANETA MRN: [id number] Epilepsy Attending: Lora Havens Referring Physician/Provider: Lorenza Chick, MD Date: 01/25/2022 Duration: 22.31 mins Patient history: 81 y.o. female with medical  history significant for Paroxysmal A-fib on Eliquis, prior stroke presenting to the ED with difficulty getting her words out similar to her prior strokes. EEG to evaluate for seizure Level of alertness: Awake, asleep AEDs during EEG study: LEV Technical aspects: This EEG study was done with scalp electrodes positioned according to the 10-20 International system of electrode placement. Electrical activity was acquired at a sampling rate of 500Hz  and reviewed with a high frequency filter of 70Hz  and a low frequency filter of 1Hz . EEG data were recorded continuously and digitally stored. Description: The posterior dominant rhythm consists of 8-9 Hz activity of moderate voltage (25-35 uV) seen predominantly in posterior head regions, symmetric and reactive to eye opening and eye closing. Sleep was characterized by vertex waves, sleep spindles (12 to 14 Hz), maximal frontocentral region. EEG showed intermittent generalized 3 to 6 Hz theta-delta slowing.  Hyperventilation and photic stimulation were not performed.   ABNORMALITY - Intermittent slow, generalized IMPRESSION: This study is suggestive of mild diffuse encephalopathy, nonspecific etiology. No seizures or epileptiform discharges were seen throughout the recording. Priyanka Barbra Sarks   ? ? ? ? ? ? ? ? ? ? ? ? LOS: 2 days  ? ?Willadean Guyton  ?[facility]  ? ?Pager on [email or web address]. If 7PM-7AM, please contact night-coverage at [email or web address] ? ? ? ? ?01/27/2022, 3:32 PM  ? ? ? ? ? ? ? ? ? ?

## 2022-01-27 NOTE — Progress Notes (Signed)
Occupational Therapy Treatment ?Patient Details ?Name: Cheyenne Frank ?MRN: [id number] ?DOB: 06/01/1941 ?Today's Date: 01/27/2022 ? ? ?History of present illness 81 y.o. female with medical history significant for Obesity, Paroxysmal A-fib on Eliquis, prior strokes, AKI, GERD, Fibromyalgia, HTN, insulin-dependent type 2 diabetes, sleep apnea, CKD stage IIIb, COPD, diastolic CHF presenting to the ED with some difficulty getting her words out, similar to her prior strokes.   Pt was recently admitted to this facility w/ acute on chronic CHF; worsening shortness of breath, weight gain, leg edema that admit. ?  ?OT comments ? Pt requesting to mobilize upon OT entering room. Pt performed supine>sit with MIN A +1, STS with RW with MIN A, short amb transfer to bedside chair with MIN A with RW. Pt educated re: importance safety with oob mobility and adhering to fall precautions. RN and tech present in room. OT will continue to follow acutely.   ? ?Recommendations for follow up therapy are one component of a multi-disciplinary discharge planning process, led by the attending physician.  Recommendations may be updated based on patient status, additional functional criteria and insurance authorization. ?   ?Follow Up Recommendations ? Skilled nursing-short term rehab (<3 hours/day)  ?  ?Assistance Recommended at Discharge Frequent or constant Supervision/Assistance  ?Patient can return home with the following ? Two people to help with walking and/or transfers;Assistance with cooking/housework;Assist for transportation;Help with stairs or ramp for entrance;A lot of help with bathing/dressing/bathroom ?  ?Equipment Recommendations ? Other (comment) (per next venue of care)  ?  ?Recommendations for Other Services   ? ?  ?Precautions / Restrictions Precautions ?Precautions: Fall ?Restrictions ?Weight Bearing Restrictions: No  ? ? ?  ? ?Mobility Bed Mobility ?  ?Bed Mobility: Supine to Sit ?  ?  ?Supine to sit: Min assist, HOB  elevated ?  ?  ?General bed mobility comments: with bed rails, MIN A for trunk ?  ? ?Transfers ?Overall transfer level: Needs assistance ?  ?Transfers: Sit to/from Stand ?Sit to Stand: Min assist ?  ?  ?  ?  ?  ?General transfer comment: vcs for hand placement with RW ?  ?  ?Balance Overall balance assessment: Needs assistance ?Sitting-balance support: Single extremity supported, Feet supported ?Sitting balance-Leahy Scale: Good ?  ?  ?Standing balance support: Bilateral upper extremity supported, Reliant on assistive device for balance ?Standing balance-Leahy Scale: Fair ?  ?  ?  ?  ?  ?  ?  ?  ?  ?  ?  ?  ?   ? ?ADL either performed or assessed with clinical judgement  ? ?ADL Overall ADL's : Needs assistance/impaired ?  ?  ?  ?  ?  ?  ?  ?  ?  ?  ?  ?  ?Toilet Transfer: Min guard;Stand-pivot;Rolling walker (2 wheels) ?Toilet Transfer Details (indicate cue type and reason): simulated to bedside chair ?  ?  ?  ?  ?  ?  ?  ? ?Extremity/Trunk Assessment   ?  ?  ?  ?  ?  ? ?Vision   ?  ?  ?Perception   ?  ?Praxis   ?  ? ?Cognition Arousal/Alertness: Awake/alert ?Behavior During Therapy: [facility] for tasks assessed/performed ?Overall Cognitive Status: Impaired/Different from baseline ?Area of Impairment: Orientation, Problem solving ?  ?  ?  ?  ?  ?  ?  ?  ?Orientation Level: Disoriented to, Time ?  ?  ?  ?  ?  ?Problem Solving: Slow processing, Requires  verbal cues ?General Comments: pt requesting to get out of bed, requires step by step vcs for safety ?  ?  ?   ?Exercises   ? ?  ?Shoulder Instructions   ? ? ?  ?General Comments spo2 88% on RA, >90% on 2 L via [place]  ? ? ?Pertinent Vitals/ Pain       Pain Assessment ?Pain Assessment: No/denies pain ? ?Home Living   ?  ?  ?  ?  ?  ?  ?  ?  ?  ?  ?  ?  ?  ?  ?  ?  ?  ?  ? ?  ?Prior Functioning/Environment    ?  ?  ?  ?   ? ?Frequency ? Min 2X/week  ? ? ? ? ?  ?Progress Toward Goals ? ?OT Goals(current goals can now be found in the care plan section) ? Progress towards OT goals:  Progressing toward goals ? ?   ?Plan Discharge plan remains appropriate   ? ?Co-evaluation ? ? ?   ?  ?  ?  ?  ? ?  ?AM-PAC OT "6 Clicks" Daily Activity     ?Outcome Measure ? ? Help from another person eating meals?: None ?Help from another person taking care of personal grooming?: A Little ?Help from another person toileting, which includes using toliet, bedpan, or urinal?: A Lot ?Help from another person bathing (including washing, rinsing, drying)?: A Lot ?Help from another person to put on and taking off regular upper body clothing?: A Little ?Help from another person to put on and taking off regular lower body clothing?: A Lot ?6 Click Score: 16 ? ?  ?End of Session Equipment Utilized During Treatment: Rolling walker (2 wheels) ? ?OT Visit Diagnosis: Other abnormalities of gait and mobility (R26.89);Muscle weakness (generalized) (M62.81);History of falling (Z91.81) ?  ?Activity Tolerance Patient tolerated treatment well ?  ?Patient Left in chair;with call bell/phone within reach;with nursing/sitter in room ?  ?Nurse Communication Mobility status ?  ? ?   ? ?Time: [id number] ?OT Time Calculation (min): 8 min ? ?Charges: OT General Charges ?$OT Visit: 1 Visit ?OT Treatments ?$Therapeutic Activity: 8-22 mins ? ?Shanon Payor, OTD OTR/L  ?01/27/22, 10:17 AM  ?

## 2022-01-27 NOTE — Progress Notes (Signed)
0800: Patient adament about going to the chair this morning. X2 assist with OT/ tech and this RN. Did well with x2 assist.  ?1015: patient adamant about going back to bed. Lethargy increased, similar to the changes yesterday. Increased stares and weakness in her upper extremities- right after feeding herself breakfast. Patient able to stand and pivot to bed with x2 assist. Patient with continuous complaint that back is hurting, restlessness and agitation. MD messaged via secure chat for a pain medicine that is not a narcotic. Tylenol given.  ?1030: Physical therapy attempt to work with patient, unable to this morning.  ?

## 2022-01-27 NOTE — Evaluation (Signed)
Physical Therapy Evaluation ?Patient Details ?Name: Cheyenne Frank ?MRN: [id number] ?DOB: 1940-11-26 ?Today's Date: 01/27/2022 ? ?History of Present Illness ? Pt is an 81 y/o F admitted on 01/24/22 after presenting with c/o speaking, similar to prior strokes. Pt was outside of tPA window. Pt noted to have elevated BP, CT & MRI was negative for acute processes. Neurology has been consulted. PMH: paroxysmal a-fib on eliquis, stroke, HTN, insulin dependent DM2, sleep apnea, CKD 3B, COPD, diastolic CHF, fibromyalgia, anxiety, macular degeneration  ?Clinical Impression ? Pt seen for PT evaluation with daughter Cheyenne Frank) present for session. Pt is writhing in bed, restless, unsure if it's 2/2 pain or not but pt does endorse back pain when asked. Pt with very poor cognition (orientation, awareness, ability to follow commands). Pt does come to sitting EOB with use of bed features & min assist & demonstrates L lateral lean. Pt initiates STS of her own volition & assisted with HHA +2 but pt returns to sitting EOB. Pt's daughter concerned as she reports pt is different compared to when she came into hospital. Notified MD & nurse of pt's behaviors during session. Will continue to follow pt acutely to address balance, gait, and activity tolerance. ?  ?Pt with R gaze preference throughout session, not turning head nor eyes to midline despite PT providing tactile/verbal cuing & attempting to manually facilitate. ?   ? ?Recommendations for follow up therapy are one component of a multi-disciplinary discharge planning process, led by the attending physician.  Recommendations may be updated based on patient status, additional functional criteria and insurance authorization. ? ?Follow Up Recommendations Skilled nursing-short term rehab (<3 hours/day) ? ?  ?Assistance Recommended at Discharge Frequent or constant Supervision/Assistance  ?Patient can return home with the following ? A lot of help with walking and/or transfers;A lot of help  with bathing/dressing/bathroom;Assistance with feeding;Assistance with cooking/housework;Assist for transportation;Direct supervision/assist for financial management;Direct supervision/assist for medications management;Help with stairs or ramp for entrance ? ?  ?Equipment Recommendations None recommended by PT  ?Recommendations for Other Services ?    ?  ?Functional Status Assessment Patient has had a recent decline in their functional status and demonstrates the ability to make significant improvements in function in a reasonable and predictable amount of time.  ? ?  ?Precautions / Restrictions Precautions ?Precautions: Fall ?Restrictions ?Weight Bearing Restrictions: No  ? ?  ? ?Mobility ? Bed Mobility ?Overal bed mobility: Needs Assistance ?Bed Mobility: Supine to Sit ?  ?  ?Supine to sit: Min assist, HOB elevated (use of bed rails) ?Sit to supine: Max assist (anticipate pt can complete movement with less assistance but requires more assistance 2/2 impaired cognition) ?  ?  ?  ? ?Transfers ?Overall transfer level: Needs assistance ?Equipment used: 2 person hand held assist ?Transfers: Sit to/from Stand ?Sit to Stand: Mod assist (pt initiates STS of her own accord & requires BUE support) ?  ?  ?  ?  ?  ?  ?  ? ?Ambulation/Gait ?  ?  ?  ?  ?  ?  ?  ?  ? ?Stairs ?  ?  ?  ?  ?  ? ?Wheelchair Mobility ?  ? ?Modified Rankin (Stroke Patients Only) ?  ? ?  ? ?Balance Overall balance assessment: Needs assistance ?Sitting-balance support: Bilateral upper extremity supported ?Sitting balance-Leahy Scale: Poor ?  ?Postural control: Left lateral lean ?  ?  ?  ?  ?  ?  ?  ?  ?  ?  ?  ?  ?  ?  ?   ? ? ? ?  Pertinent Vitals/Pain Pain Assessment ?Pain Assessment: Faces ?Faces Pain Scale: Hurts even more ?Pain Location: back ?Pain Descriptors / Indicators: Discomfort ?Pain Intervention(s): Monitored during session, Repositioned (nurse notified)  ? ? ?Home Living Family/patient expects to be discharged to:: Private residence ?Living  Arrangements: Children ?Available Help at Discharge: Family (daughter is sick & having more difficulty caring for pt) ?Type of Home: House ?Home Access: Stairs to enter ?Entrance Stairs-Rails: Right ?Entrance Stairs-Number of Steps: 5 ?  ?Home Layout: Two level;Able to live on main level with bedroom/bathroom ?Home Equipment: Agricultural consultant (2 wheels);Wheelchair - manual;BSC/3in1;Shower seat - built in;Other (comment) ?Additional Comments: Lives with daughter and son-in-law. Daughter has MS - is on disability so present 24/7 however only able to provide moderate levels of assist  ?  ?Prior Function Prior Level of Function : Needs assist ?  ?  ?  ?  ?  ?  ?Mobility Comments: amb w/ RW short household distances sometimes with assist from dtr ?  ?  ? ? ?Hand Dominance  ?   ? ?  ?Extremity/Trunk Assessment  ? Upper Extremity Assessment ?Upper Extremity Assessment: Overall WFL for tasks assessed ?  ? ?Lower Extremity Assessment ?Lower Extremity Assessment: Overall WFL for tasks assessed;Generalized weakness (appears fairly stroke, able to come to sitting EOB & initiates STS) ?  ? ?   ?Communication  ? Communication: HOH  ?Cognition Arousal/Alertness: Awake/alert ?Behavior During Therapy: Restless ?Overall Cognitive Status: Impaired/Different from baseline ?Area of Impairment: Orientation, Problem solving, Attention, Following commands, Safety/judgement, Awareness, Memory ?  ?  ?  ?  ?  ?  ?  ?  ?Orientation Level: Disoriented to, Time, Place, Situation ?Current Attention Level: Focused ?Memory: Decreased recall of precautions, Decreased short-term memory ?Following Commands: Follows one step commands inconsistently, Follows one step commands with increased time ?Safety/Judgement: Decreased awareness of safety, Decreased awareness of deficits ?Awareness: Intellectual ?Problem Solving: Slow processing, Requires verbal cues, Decreased initiation, Difficulty sequencing, Requires tactile cues ?General Comments: Pt with poor  awareness to overall situation, partially responds to name, doesn't follow simple commands well, writhing in bed possibly 2/2 pain ?  ?  ? ?  ?General Comments General comments (skin integrity, edema, etc.): Pt on room air, initially 90% but drops to 88%, placed pt back on 2L/min with pt requiring cuing to not remove cannula ? ?  ?Exercises    ? ?Assessment/Plan  ?  ?PT Assessment Patient needs continued PT services  ?PT Problem List Decreased mobility;Decreased strength;Decreased safety awareness;Decreased activity tolerance;Decreased balance;Decreased knowledge of use of DME;Pain;Cardiopulmonary status limiting activity;Decreased coordination ? ?   ?  ?PT Treatment Interventions DME instruction;Therapeutic activities;Modalities;Cognitive remediation;Gait training;Therapeutic exercise;Patient/family education;Stair training;Functional mobility training;Balance training;Neuromuscular re-education;Manual techniques   ? ?PT Goals (Current goals can be found in the Care Plan section)  ?Acute Rehab PT Goals ?Patient Stated Goal: figure out what's going on ?PT Goal Formulation: With family ?Time For Goal Achievement: 02/10/22 ?Potential to Achieve Goals: Fair ? ?  ?Frequency Min 2X/week ?  ? ? ?Co-evaluation   ?  ?  ?  ?  ? ? ?  ?AM-PAC PT "6 Clicks" Mobility  ?Outcome Measure Help needed turning from your back to your side while in a flat bed without using bedrails?: A Little ?Help needed moving from lying on your back to sitting on the side of a flat bed without using bedrails?: A Little ?Help needed moving to and from a bed to a chair (including a wheelchair)?: A Lot ?Help needed standing up from a chair  using your arms (e.g., wheelchair or bedside chair)?: A Lot ?Help needed to walk in hospital room?: A Lot ?Help needed climbing 3-5 steps with a railing? : Total ?6 Click Score: 13 ? ?  ?End of Session   ?Activity Tolerance:  (limited 2/2 confusion) ?Patient left: in bed;with call bell/phone within reach;with bed  alarm set;with family/visitor present ?Nurse Communication: Mobility status ?PT Visit Diagnosis: Muscle weakness (generalized) (M62.81);Difficulty in walking, not elsewhere classified (R26.2);Unsteadiness on

## 2022-01-27 NOTE — Progress Notes (Signed)
Subjective: ?Irritable and agitated this morning, at which time her speech was very impaired per nursing and family ?Daughter was also concerned that the patient's coordination was impaired such that she was unable to feed herself which is not her baseline ? ? ?Objective: ?Vital signs in last 24 hours: ?Temp:  [97.8 ?F (36.6 ?C)-98.6 ?F (37 ?C)] 98.2 ?F (36.8 ?C) (05/13 1209) ?Pulse Rate:  [64-82] 82 (05/13 1209) ?Resp:  [17-20] 18 (05/13 1209) ?BP: (144-175)/(46-79) 156/79 (05/13 1209) ?SpO2:  [93 %-100 %] 93 % (05/13 1209) ? ? ?Exam:  ?General: In bed, clutching her head predominantly the left side but occasionally the right ?Pulmonary: Intermittent wheezing ?Cardiac: Perfusing extremities well ?Abdomen: Protuberant, soft, nontender ? ?Mental status: Language is much improved, the patient is able to tell me her name, repeat that today is a sunny day, naming her thumb, follows simple commands, and communicate her preferences and much more complete sentences.  For example she refuses to sit up in the bed and eat, instead insisting on sitting on the side of the bed ?Cranial nerves: Visual fields full to confrontation, correctly count fingers, EOMI, face symmetric, tongue midline ?Sensory/motor: Using all 4 extremities freely and equally, able to pull blanket up over her shoulders, reacts equally to light touch in all 4 extremities.  Able to use her legs equally to boost herself up in the bed and to sit on the side of the bed ?Coordination: Mildly impaired for feeding herself, but able to correct the orientation of her spoon and successfully eat several bites of soup ? ? ?Lab Results: ? ?Basic Metabolic Panel: ?Recent Labs  ?Lab 01/24/22 ?1830 01/25/22 ?0423 01/27/22 ?0962  ?NA 139  --  141  ?K 3.4*  --  3.4*  ?CL 100  --  104  ?CO2 29  --  32  ?GLUCOSE 390*  --  148*  ?BUN 25*  --  18  ?CREATININE 1.30*  --  1.07*  ?CALCIUM 8.8*  --  8.7*  ?MG  --  2.1 2.1  ?PHOS  --   --  3.2  ? ? ? ?CBC: ?Recent Labs  ?Lab  01/24/22 ?1830  ?WBC 8.2  ?NEUTROABS 6.2  ?HGB 12.8  ?HCT 39.9  ?MCV 90.1  ?PLT 159  ? ? ?Coagulation Studies: ?No results for input(s): LABPROT, INR in the last 72 hours.  ? ? ? ?Studies/Results: ?No new brain specific studies ? ?Medications: ? ?Current Facility-Administered Medications:  ?   stroke: early stages of recovery book, , Does not apply, Once, Andris Baumann, MD ?  acetaminophen (TYLENOL) tablet 650 mg, 650 mg, Oral, Q4H PRN, 650 mg at 01/27/22 1049 **OR** acetaminophen (TYLENOL) 160 MG/5ML solution 650 mg, 650 mg, Per Tube, Q4H PRN **OR** acetaminophen (TYLENOL) suppository 650 mg, 650 mg, Rectal, Q4H PRN, Andris Baumann, MD ?  albuterol (PROVENTIL) (2.5 MG/3ML) 0.083% nebulizer solution 2.5 mg, 2.5 mg, Inhalation, Q4H PRN, Andris Baumann, MD ?  apixaban Everlene Balls) tablet 5 mg, 5 mg, Oral, BID, Andris Baumann, MD, 5 mg at 01/27/22 0848 ?  aspirin EC tablet 81 mg, 81 mg, Oral, Daily, Andris Baumann, MD, 81 mg at 01/27/22 0849 ?  carvedilol (COREG) tablet 25 mg, 25 mg, Oral, BID, Enedina Finner, MD, 25 mg at 01/27/22 0848 ?  haloperidol lactate (HALDOL) injection 5 mg, 5 mg, Intramuscular, Q6H PRN, Lurene Shadow, MD ?  hydrALAZINE (APRESOLINE) tablet 25 mg, 25 mg, Oral, TID PRN, Enedina Finner, MD, 25 mg at 01/25/22 1329 ?  insulin aspart (novoLOG) injection 0-5 Units, 0-5 Units, Subcutaneous, QHS, Fritzi Mandes, MD, 3 Units at 01/26/22 2140 ?  insulin aspart (novoLOG) injection 0-9 Units, 0-9 Units, Subcutaneous, TID WC, Fritzi Mandes, MD, 3 Units at 01/27/22 1258 ?  insulin glargine-yfgn (SEMGLEE) injection 30 Units, 30 Units, Subcutaneous, QHS, Athena Masse, MD, 30 Units at 01/26/22 2140 ?  levETIRAcetam (KEPPRA) tablet 750 mg, 750 mg, Oral, BID, Jennye Boroughs, MD ?  losartan (COZAAR) tablet 25 mg, 25 mg, Oral, Daily, Fritzi Mandes, MD, 25 mg at 01/27/22 0848 ?  multivitamin with minerals tablet 1 tablet, 1 tablet, Oral, Daily, Fritzi Mandes, MD, 1 tablet at 01/27/22 0848 ?  oxybutynin (DITROPAN-XL) 24 hr  tablet 10 mg, 10 mg, Oral, Daily, Fritzi Mandes, MD, 10 mg at 01/27/22 0849 ?  pantoprazole (PROTONIX) EC tablet 40 mg, 40 mg, Oral, Daily, Fritzi Mandes, MD, 40 mg at 01/27/22 0848 ?  PARoxetine (PAXIL) tablet 40 mg, 40 mg, Oral, QHS, Athena Masse, MD, 40 mg at 01/26/22 2141 ?  rosuvastatin (CRESTOR) tablet 2.5 mg, 2.5 mg, Oral, QHS, Athena Masse, MD, 2.5 mg at 01/26/22 2141 ? ?Assessment/Plan: ?Patient's aphasia has been improving, and her exam for me today is the best that I have seen.  Certainly Keppra could contribute to irritability, but on my evaluation she is quite pleasant, and there can be multiple different reasons for irritability.  Discussed with daughter that I cannot definitively say that the patient's intermittent aphasia is secondary to focal seizures, but for now I would continue the Keppra.  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  Additionally for diagnostic clarity arrangement for an EMU stay may be helpful.  Certainly she may not be able to compensate as well for her aphasia when she is stressed, or when her blood pressure is high or glucose is low, and therefore these events may be multifactorial in nature. ?-Continue Keppra 750 mg twice daily ?-No further inpatient neurological work-up indicated at this time ?-Continue to counsel daughter and patient on importance of finding a CPAP that will work for her, blood pressure and glucose control ?-Close outpatient follow-up with neurology ?-Please reach out if new questions or neurological concerns arise ? ? ? LOS: 2 days  ? ?Seanpaul Preece L Kalayna Noy ? ?Greater than 50 minutes were spent in care of this patient today, greater than 50% at bedside  ? ? ?

## 2022-01-27 NOTE — Progress Notes (Signed)
SLP Cancellation Note ? ?Patient Details ?Name: Cheyenne Frank ?MRN: [id number] ?DOB: 12/01/1940 ? ? ?Cancelled treatment:       Reason Eval/Treat Not Completed: Medical issues which prohibited therapy ? ?Pt is awake with family member present. Pt is confused with family member reporting that "it has been a good morning for" pt. Pt is unable to engage with this Clinical research associate.  ? ?At this time, will discharge order for a speech-language evaluation as this is the 3rd attempt.  ? ?Please reconsult as pt is appropriate.  ? ?Deacon Gadbois B. Dreama Saa, M.S., CCC-SLP, CBIS ?Speech-Language Pathologist ?Rehabilitation Services ?Office [phone] ? ?Zoriana Oats ?01/27/2022, 11:09 AM ?

## 2022-01-28 DIAGNOSIS — I5032 Chronic diastolic (congestive) heart failure: Secondary | ICD-10-CM | POA: Diagnosis not present

## 2022-01-28 LAB — GLUCOSE, CAPILLARY
Glucose-Capillary: 108 mg/dL — ABNORMAL HIGH (ref 70–99)
Glucose-Capillary: 407 mg/dL — ABNORMAL HIGH (ref 70–99)
Glucose-Capillary: 350 mg/dL — ABNORMAL HIGH (ref 70–99)
Glucose-Capillary: 252 mg/dL — ABNORMAL HIGH (ref 70–99)

## 2022-01-28 MED ORDER — POTASSIUM CHLORIDE CRYS ER 20 MEQ PO TBCR
20.0000 meq | EXTENDED_RELEASE_TABLET | Freq: Every day | ORAL | Status: DC
  Administered 2022-01-28: 20 meq via ORAL
  Filled 2022-01-28: qty 1

## 2022-01-28 MED ORDER — INSULIN GLARGINE-YFGN 100 UNIT/ML ~~LOC~~ SOLN
50.0000 [IU] | Freq: Every day | SUBCUTANEOUS | Status: DC
Start: 2022-01-28 — End: 2022-01-29
  Administered 2022-01-28: 50 [IU] via SUBCUTANEOUS
  Filled 2022-01-28 (×2): qty 0.5

## 2022-01-28 MED ORDER — INSULIN ASPART 100 UNIT/ML IJ SOLN
0.0000 [IU] | Freq: Three times a day (TID) | INTRAMUSCULAR | Status: DC
  Administered 2022-01-28: 15 [IU] via SUBCUTANEOUS
  Administered 2022-01-29: 7 [IU] via SUBCUTANEOUS
  Administered 2022-01-29: 15 [IU] via SUBCUTANEOUS
  Administered 2022-01-30: 7 [IU] via SUBCUTANEOUS
  Administered 2022-01-30: 11 [IU] via SUBCUTANEOUS
  Administered 2022-01-31: 4 [IU] via SUBCUTANEOUS
  Administered 2022-01-31 (×2): 7 [IU] via SUBCUTANEOUS
  Administered 2022-02-01 (×2): 3 [IU] via SUBCUTANEOUS
  Administered 2022-02-01: 7 [IU] via SUBCUTANEOUS
  Administered 2022-02-02 (×2): 4 [IU] via SUBCUTANEOUS
  Administered 2022-02-02: 7 [IU] via SUBCUTANEOUS
  Administered 2022-02-03: 11 [IU] via SUBCUTANEOUS
  Administered 2022-02-03: 7 [IU] via SUBCUTANEOUS
  Administered 2022-02-03: 4 [IU] via SUBCUTANEOUS
  Filled 2022-01-28 (×17): qty 1

## 2022-01-28 MED ORDER — INSULIN ASPART 100 UNIT/ML IJ SOLN
0.0000 [IU] | Freq: Every day | INTRAMUSCULAR | Status: DC
  Administered 2022-01-28 – 2022-01-29 (×2): 2 [IU] via SUBCUTANEOUS
  Administered 2022-01-30: 4 [IU] via SUBCUTANEOUS
  Administered 2022-01-31: 3 [IU] via SUBCUTANEOUS
  Filled 2022-01-28 (×3): qty 1

## 2022-01-28 NOTE — NC FL2 (Signed)
? MEDICAID FL2 LEVEL OF CARE SCREENING TOOL  ?  ? ?IDENTIFICATION  ?Patient Name: ?Cheyenne Frank Birthdate: 27-Apr-1941 Sex: female Admission Date (Current Location): ?01/24/2022  ?[place] and [place] Number: ? [id number] ?  Facility and Address:  ?[facility], [address], [place],  [address] ?     Provider Number: ?[id number]  ?Attending Physician Name and Address:  ?Jennye Boroughs, MD ? Relative Name and Phone Number:  ?Atha Starks (Daughter)   [phone] [facility]) ?   ?Current Level of Care: ?Hospital Recommended Level of Care: ?[place] Prior Approval Number: ?  ? ?Date Approved/Denied: ?  PASRR Number: ?[id number] ? ?Discharge Plan: ?  ?  ? ?Current Diagnoses: ?Patient Active Problem List  ? Diagnosis Date Noted  ? Aphasia 01/26/2022  ? Acute CVA (cerebrovascular accident) ([place]) 01/25/2022  ? Hypertensive urgency 01/24/2022  ? Acute focal neurological deficit - possible acute CVA 01/24/2022  ? Paroxysmal atrial fibrillation ([place]) 01/24/2022  ? History of CVA (cerebrovascular accident) 01/24/2022  ? Hoarseness of voice 10/27/2021  ? Chronic diastolic CHF (congestive heart failure) ([place]) 10/26/2021  ? Acute hypoxemic respiratory failure ([place]) 10/26/2021  ? Atrial flutter with rapid ventricular response ([place]) 09/16/2021  ? Stage 3b chronic kidney disease (CKD) ([place]) 09/16/2021  ? Cerebrovascular disease 09/16/2021  ? COPD (chronic obstructive pulmonary disease) ([place]) 09/16/2021  ? Depression 09/16/2021  ? Acute diastolic CHF (congestive heart failure) ([place])   ? Obesity, Class III, BMI 40-49.9 (morbid obesity) ([place])   ? Atrial fibrillation with RVR ([place]) 05/16/2021  ? Head injury 12/27/2020  ? SDH (subdural hematoma) ([place]) 12/11/2013  ? CVA (cerebral infarction) 12/11/2013  ? Uncontrolled type 2 diabetes mellitus with hyperglycemia, with long-term current use of insulin ([place]) 04/07/2013  ? Essential hypertension 04/07/2013  ? Sleep apnea 04/07/2013  ? OA  (osteoarthritis) of knee 04/06/2013  ? ? ?Orientation RESPIRATION BLADDER Height & Weight   ?  ?Self, Place ? O2 Continent Weight: 244 lb 14.9 oz (111.1 kg) (taken from march) ?Height:     ?BEHAVIORAL SYMPTOMS/MOOD NEUROLOGICAL BOWEL NUTRITION STATUS  ?    Continent Diet (dys 3)  ?AMBULATORY STATUS COMMUNICATION OF NEEDS Skin   ?Limited Assist Verbally Other (Comment) (redness) ?  ?  ?  ?    ?     ?     ? ? ?Personal Care Assistance Level of Assistance  ?Bathing, Feeding, Dressing Bathing Assistance: Limited assistance ?Feeding assistance: Limited assistance ?Dressing Assistance: Limited assistance ?   ? ?Functional Limitations Info  ?    ?  ?   ? ? ?SPECIAL CARE FACTORS FREQUENCY  ?PT (By licensed PT), OT (By licensed OT)   ?  ?PT Frequency: 5 times per week ?OT Frequency: 5 times per week ?  ?  ?  ?   ? ? ?Contractures    ? ? ?Additional Factors Info  ?Code Status, Allergies Code Status Info: full ?Allergies Info: Allergies: Codeine, Dilaudid (Hydromorphone), Demerol (Meperidine), Pravastatin, Amiodarone, Lipitor (Atorvastatin), Nifedipine, Statins, Sulfa Antibiotics, Vancomycin ?  ?  ?  ?   ? ?Current Medications (01/28/2022):  This is the current hospital active medication list ?Current Facility-Administered Medications  ?Medication Dose Route Frequency Provider Last Rate Last Admin  ?  stroke: early stages of recovery book   Does not apply Once Athena Masse, MD      ? acetaminophen (TYLENOL) tablet 650 mg  650 mg Oral Q4H PRN Athena Masse, MD   650 mg at 01/27/22 1049  ?  Or  ? acetaminophen (TYLENOL) 160 MG/5ML solution 650 mg  650 mg Per Tube Q4H PRN Athena Masse, MD      ? Or  ? acetaminophen (TYLENOL) suppository 650 mg  650 mg Rectal Q4H PRN Athena Masse, MD      ? albuterol (PROVENTIL) (2.5 MG/3ML) 0.083% nebulizer solution 2.5 mg  2.5 mg Inhalation Q4H PRN Athena Masse, MD      ? apixaban Arne Cleveland) tablet 5 mg  5 mg Oral BID Athena Masse, MD   5 mg at 01/28/22 0948  ? aspirin EC tablet 81  mg  81 mg Oral Daily Athena Masse, MD   81 mg at 01/28/22 [id number]  ? carvedilol (COREG) tablet 25 mg  25 mg Oral BID Fritzi Mandes, MD   25 mg at 01/28/22 [id number]  ? haloperidol lactate (HALDOL) injection 5 mg  5 mg Intramuscular Q6H PRN Jennye Boroughs, MD      ? hydrALAZINE (APRESOLINE) tablet 25 mg  25 mg Oral TID PRN Fritzi Mandes, MD   25 mg at 01/25/22 1329  ? insulin aspart (novoLOG) injection 0-20 Units  0-20 Units Subcutaneous TID WC Jennye Boroughs, MD      ? insulin aspart (novoLOG) injection 0-5 Units  0-5 Units Subcutaneous QHS Jennye Boroughs, MD      ? insulin glargine-yfgn (SEMGLEE) injection 50 Units  50 Units Subcutaneous QHS Jennye Boroughs, MD      ? levETIRAcetam (KEPPRA) tablet 750 mg  750 mg Oral BID Jennye Boroughs, MD   750 mg at 01/28/22 0948  ? losartan (COZAAR) tablet 25 mg  25 mg Oral Daily Fritzi Mandes, MD   25 mg at 01/28/22 [id number]  ? multivitamin with minerals tablet 1 tablet  1 tablet Oral Daily Fritzi Mandes, MD   1 tablet at 01/28/22 0948  ? oxybutynin (DITROPAN-XL) 24 hr tablet 10 mg  10 mg Oral Daily Fritzi Mandes, MD   10 mg at 01/28/22 0948  ? pantoprazole (PROTONIX) EC tablet 40 mg  40 mg Oral Daily Fritzi Mandes, MD   40 mg at 01/28/22 0948  ? PARoxetine (PAXIL) tablet 40 mg  40 mg Oral QHS Athena Masse, MD   40 mg at 01/27/22 2010  ? potassium chloride SA (KLOR-CON M) CR tablet 20 mEq  20 mEq Oral Daily Jennye Boroughs, MD   20 mEq at 01/28/22 0948  ? rosuvastatin (CRESTOR) tablet 2.5 mg  2.5 mg Oral QHS Athena Masse, MD   2.5 mg at 01/27/22 2010  ? ? ? ?Discharge Medications: ?Please see discharge summary for a list of discharge medications. ? ?Relevant Imaging Results: ? ?Relevant Lab Results: ? ? ?Additional Information ?SS #: [id number] ? ?Saivon Prowse E Eshaal Duby, LCSW ? ? ? ? ?

## 2022-01-28 NOTE — TOC CM/SW Note (Signed)
Per chart patient is not fully oriented. ?Attempted call to daughter [name] for SNF workup. Left VM requesting return call. ? Alfonso Ramus, LCSW ?[phone] ? ?

## 2022-01-28 NOTE — TOC Initial Note (Addendum)
Transition of Care (TOC) - Initial/Assessment Note  ? ? ?Patient Details  ?Name: PAMLER WELLE ?MRN: [id number] ?Date of Birth: Jul 30, 1941 ? ?Transition of Care (TOC) CM/SW Contact:    ?Oaklie Durrett E Baylin Cabal, LCSW ?Phone Number: ?01/28/2022, 2:29 PM ? ?Clinical Narrative:                CSW spoke with patient's daughter [name] about DC planning. ?Patient lives with her other daughter. PCP is Therapist, occupational in [place]. Pharmacy is [facility] in [place]. Patient has a RW and walk in tub at home. Patient had [facility] in the past. Patient has been to [facility] in the past for SNF. ?[name] is agreeable to SNF recommendation. She prefers [facility] or [facility]. She stated patient has had the first 2 COVID vaccines but no booster, explained [facility] will only take patients if they have had 1 booster. [name] stated they would prefer [facility] if possible in that case because they would prefer patient not get the booster at this time. [name] stated they do not want [facility] or [facility].  ?CSW is starting SNF work up.  ? ? ?Expected Discharge Plan: [place] ?Barriers to Discharge: Continued Medical Work up ? ? ?Patient Goals and CMS Choice ?Patient states their goals for this hospitalization and ongoing recovery are:: SNF ?CMS Medicare.gov Compare Post Acute Care list provided to:: Patient Represenative (must comment) ?Choice offered to / list presented to : Adult Children ? ?Expected Discharge Plan and Services ?Expected Discharge Plan: [place] ?  ?  ?  ?Living arrangements for the past 2 months: [place] ?                ?  ?  ?  ?  ?  ?  ?  ?  ?  ?  ? ?Prior Living Arrangements/Services ?Living arrangements for the past 2 months: [place] ?Lives with:: Adult Children ?Patient language and need for interpreter reviewed:: Yes ?Do you feel safe going back to the place where you live?: Yes      ?Need for Family Participation in Patient Care: Yes (Comment) ?Care giver  support system in place?: Yes (comment) ?Current home services: DME ?Criminal Activity/Legal Involvement Pertinent to Current Situation/Hospitalization: No - Comment as needed ? ?Activities of Daily Living ?Home Assistive Devices/Equipment: Gilford Rile (specify type) ?ADL Screening (condition at time of admission) ?Patient's cognitive ability adequate to safely complete daily activities?: No ?Is the patient deaf or have difficulty hearing?: No ?Does the patient have difficulty seeing, even when wearing glasses/contacts?: No ?Does the patient have difficulty concentrating, remembering, or making decisions?: Yes ?Patient able to express need for assistance with ADLs?: No ?Does the patient have difficulty dressing or bathing?: Yes ?Independently performs ADLs?: No ?Communication: Independent ?Dressing (OT): Needs assistance ?Is this a change from baseline?: Change from baseline, expected to last <3days ?Grooming: Needs assistance ?Is this a change from baseline?: Change from baseline, expected to last <3 days ?Feeding: Needs assistance ?Is this a change from baseline?: Change from baseline, expected to last <3 days ?Bathing: Needs assistance ?Is this a change from baseline?: Change from baseline, expected to last <3 days ?Toileting: Needs assistance ?Is this a change from baseline?: Pre-admission baseline ?In/Out Bed: Needs assistance ?Is this a change from baseline?: Pre-admission baseline ?Walks in Home: Needs assistance ?Is this a change from baseline?: Pre-admission baseline ?Does the patient have difficulty walking or climbing stairs?: Yes ?Weakness of Legs: Both ?Weakness of Arms/Hands: Both ? ?  Permission Sought/Granted ?Permission sought to share information with : Customer service manager ?Permission granted to share information with : Yes, Verbal Permission Granted (by daughter [name]) ?   ? Permission granted to share info w AGENCY: SNFs ?   ?   ? ?Emotional Assessment ?  ?  ?  ?Orientation: : Fluctuating  Orientation (Suspected and/or reported [place]) ?Alcohol / Substance Use: Not Applicable ?Psych Involvement: No (comment) ? ?Admission diagnosis:  Hyperglycemia [R73.9] ?Acute CVA (cerebrovascular accident) ([place]) [I63.9] ?Cerebrovascular accident (CVA), unspecified mechanism ([place]) [I63.9] ?Patient Active Problem List  ? Diagnosis Date Noted  ? Aphasia 01/26/2022  ? Acute CVA (cerebrovascular accident) ([place]) 01/25/2022  ? Hypertensive urgency 01/24/2022  ? Acute focal neurological deficit - possible acute CVA 01/24/2022  ? Paroxysmal atrial fibrillation ([place]) 01/24/2022  ? History of CVA (cerebrovascular accident) 01/24/2022  ? Hoarseness of voice 10/27/2021  ? Chronic diastolic CHF (congestive heart failure) ([place]) 10/26/2021  ? Acute hypoxemic respiratory failure ([place]) 10/26/2021  ? Atrial flutter with rapid ventricular response ([place]) 09/16/2021  ? Stage 3b chronic kidney disease (CKD) ([place]) 09/16/2021  ? Cerebrovascular disease 09/16/2021  ? COPD (chronic obstructive pulmonary disease) ([place]) 09/16/2021  ? Depression 09/16/2021  ? Acute diastolic CHF (congestive heart failure) ([place])   ? Obesity, Class III, BMI 40-49.9 (morbid obesity) ([place])   ? Atrial fibrillation with RVR ([place]) 05/16/2021  ? Head injury 12/27/2020  ? SDH (subdural hematoma) ([place]) 12/11/2013  ? CVA (cerebral infarction) 12/11/2013  ? Uncontrolled type 2 diabetes mellitus with hyperglycemia, with long-term current use of insulin ([place]) 04/07/2013  ? Essential hypertension 04/07/2013  ? Sleep apnea 04/07/2013  ? OA (osteoarthritis) of knee 04/06/2013  ? ?PCP:  [facility] ?Pharmacy:   ?[facility] [address] [facility] ?[place] [address] ?Phone: [phone] Fax: [phone] ? ? ? ? ?Social Determinants of Health (SDOH) Interventions ?  ? ?Readmission Risk Interventions ? ?  01/28/2022  ?  2:28 PM  ?Readmission Risk Prevention Plan  ?Transportation Screening Complete  ?PCP or  Specialist Appt within 3-5 Days Complete  ?[place] or Home Care Consult Complete  ?Social Work Consult for [place] Planning/Counseling Complete  ?Palliative Care Screening Complete  ?Medication Review Press photographer) Complete  ? ? ? ?

## 2022-01-28 NOTE — Progress Notes (Addendum)
? ? ? ? ?Progress Note  ? ? ?Cheyenne Frank  [id number] DOB: 03-Jan-1941  DOA: 01/24/2022 ?PCP: Scot Dock, [facility]  ? ? ? ? ?Brief Narrative:  ? ? ?Medical records reviewed and are as summarized below: ? ?Cheyenne Frank is a 80 y.o. female with medical history significant for Paroxysmal A-fib on Eliquis, prior stroke, HTN, insulin-dependent type 2 diabetes, sleep apnea, CKD stage IIIb, COPD, diastolic CHF, who presented to the hospital with getting her words out.  Reportedly, BP was 232/102 when EMS picked her up. ? ? ? ?Assessment/Plan:  ? ?Principal Problem: ?  Aphasia ?Active Problems: ?  Acute focal neurological deficit - possible acute CVA ?  History of CVA (cerebrovascular accident) ?  Hypertensive urgency ?  Uncontrolled type 2 diabetes mellitus with hyperglycemia, with long-term current use of insulin ([place]) ?  Sleep apnea ?  Stage 3b chronic kidney disease (CKD) ([place]) ?  COPD (chronic obstructive pulmonary disease) ([place]) ?  Chronic diastolic CHF (congestive heart failure) ([place]) ?  Paroxysmal atrial fibrillation (HCC) ?  Acute CVA (cerebrovascular accident) ([place]) ? ? ? ? ?Body mass index is 39.53 kg/m?.  (Obesity) ? ? ?Aphasia, suspected seizures: Continue Keppra no epileptiform activity on EEG.  No acute stroke on MRI brain.  Follow-up with neurologist for further recommendation. ? ?Hypertensive urgency: Continue losartan and carvedilol ? ?Type II DM with severe hyperglycemia: According to admission med rec, she takes Lantus 64 units nightly.  She was only getting glargine 30 units nightly.  Increase insulin glargine from 30 to 50 units nightly.  Increase sliding scale insulin.  Monitor glucose closely and adjust insulin as needed. ? ?Hypokalemia: Replace potassium and monitor levels ? ?Paroxysmal atrial fibrillation with history of stroke: Continue metoprolol and Eliquis ? ?Chronic diastolic CHF: Compensated.  Continue antihypertensives ? ?COPD: Stable.  Continue  bronchodilators ? ?Generalized weakness: PT and OT recommend discharge to SNF ? ?Other comorbidities include OSA, CKD stage IIIb ? ?Plan discussed with Cheyenne Frank, daughter, over the phone ? ?Diet Order   ? ?       ?  DIET DYS 3 Room service appropriate? Yes with Assist; Fluid consistency: Thin  Diet effective now       ?  ? ?  ?  ? ?  ? ? ? ? ? ? ? ? ? ?Consultants: ?Neurologist ? ?Procedures: ?None ? ? ? ?Medications:  ? ?  stroke: early stages of recovery book   Does not apply Once  ? apixaban  5 mg Oral BID  ? aspirin EC  81 mg Oral Daily  ? carvedilol  25 mg Oral BID  ? insulin aspart  0-20 Units Subcutaneous TID WC  ? insulin aspart  0-5 Units Subcutaneous QHS  ? insulin glargine-yfgn  50 Units Subcutaneous QHS  ? levETIRAcetam  750 mg Oral BID  ? losartan  25 mg Oral Daily  ? multivitamin with minerals  1 tablet Oral Daily  ? oxybutynin  10 mg Oral Daily  ? pantoprazole  40 mg Oral Daily  ? PARoxetine  40 mg Oral QHS  ? potassium chloride  20 mEq Oral Daily  ? rosuvastatin  2.5 mg Oral QHS  ? ?Continuous Infusions: ? ? ? ? ?Anti-infectives (From admission, onward)  ? ? None  ? ?  ? ? ? ? ? ? ? ? ? ?Family Communication/Anticipated D/C date and plan/Code Status  ? ?DVT prophylaxis:  ?apixaban (ELIQUIS) tablet 5 mg  ? ?  Code Status: Full Code ? ?  Family Communication: Plan discussed with Cheyenne Frank, daughter, over the phone  ?Disposition Plan: Plan to discharge to SNF ? ? ?Status is: Inpatient ?Remains inpatient appropriate because: Altered mental status, suspected seizures ? ? ? ? ? ? ?Subjective:  ? ?Interval events noted.  She has no complaints.  She feels better. ? ?Objective:  ? ? ?Vitals:  ? 01/27/22 2013 01/28/22 0522 01/28/22 0748 01/28/22 1156  ?BP: 131/84 (!) 186/61 (!) 176/69 119/86  ?Pulse: 75 66 69 70  ?Resp: 20 20 18 18   ?Temp: 98.4 ?F (36.9 ?C) 98 ?F (36.7 ?C) 98.6 ?F (37 ?C) 98 ?F (36.7 ?C)  ?TempSrc: Oral Oral    ?SpO2: 96% 93% 93% 100%  ?Weight:      ? ?No data found. ? ? ?Intake/Output Summary (Last 24  hours) at 01/28/2022 1447 ?Last data filed at 01/28/2022 0522 ?Gross per 24 hour  ?Intake --  ?Output 500 ml  ?Net -500 ml  ? ?Filed Weights  ? 01/25/22 1300  ?Weight: 111.1 kg  ? ? ?Exam: ? ?GEN: NAD, sitting up in the chair ?SKIN: Warm and dry ?EYES: EOMI ?ENT: MMM ?CV: RRR ?PULM: CTA B ?ABD: soft, obese, NT, +BS ?CNS: AAO x 2 (person and place), non focal ?EXT: No edema or tenderness ? ? ? ? ?  ? ? ?Data Reviewed:  ? ?I have personally reviewed following labs and imaging studies: ? ?Labs: ?Labs show the following:  ? ?Basic Metabolic Panel: ?Recent Labs  ?Lab 01/24/22 ?1830 01/25/22 ?0423 01/27/22 ?[id number]  ?NA 139  --  141  ?K 3.4*  --  3.4*  ?CL 100  --  104  ?CO2 29  --  32  ?GLUCOSE 390*  --  148*  ?BUN 25*  --  18  ?CREATININE 1.30*  --  1.07*  ?CALCIUM 8.8*  --  8.7*  ?MG  --  2.1 2.1  ?PHOS  --   --  3.2  ? ?GFR ?Estimated Creatinine Clearance: 53 mL/min (A) (by C-G formula based on SCr of 1.07 mg/dL (H)). ?Liver Function Tests: ?Recent Labs  ?Lab 01/24/22 ?1830  ?AST 33  ?ALT 21  ?ALKPHOS 82  ?BILITOT 0.9  ?PROT 6.5  ?ALBUMIN 3.2*  ? ?No results for input(s): LIPASE, AMYLASE in the last 168 hours. ?No results for input(s): AMMONIA in the last 168 hours. ?Coagulation profile ?No results for input(s): INR, PROTIME in the last 168 hours. ? ?CBC: ?Recent Labs  ?Lab 01/24/22 ?1830  ?WBC 8.2  ?NEUTROABS 6.2  ?HGB 12.8  ?HCT 39.9  ?MCV 90.1  ?PLT 159  ? ?Cardiac Enzymes: ?No results for input(s): CKTOTAL, CKMB, CKMBINDEX, TROPONINI in the last 168 hours. ?BNP (last 3 results) ?No results for input(s): PROBNP in the last 8760 hours. ?CBG: ?Recent Labs  ?Lab 01/27/22 ?1210 01/27/22 ?1625 01/27/22 ?2014 01/28/22 ?0720 01/28/22 ?1154  ?GLUCAP 216* 282* 342* 108* 407*  ? ?D-Dimer: ?No results for input(s): DDIMER in the last 72 hours. ?Hgb A1c: ?No results for input(s): HGBA1C in the last 72 hours. ?Lipid Profile: ?No results for input(s): CHOL, HDL, LDLCALC, TRIG, CHOLHDL, LDLDIRECT in the last 72 hours. ? ?Thyroid  function studies: ?No results for input(s): TSH, T4TOTAL, T3FREE, THYROIDAB in the last 72 hours. ? ?Invalid input(s): FREET3 ?Anemia work up: ?No results for input(s): VITAMINB12, FOLATE, FERRITIN, TIBC, IRON, RETICCTPCT in the last 72 hours. ?Sepsis Labs: ?Recent Labs  ?Lab 01/24/22 ?1830  ?WBC 8.2  ? ? ?Microbiology ?No results found for this or any previous visit (from  the past 240 hour(s)). ? ?Procedures and diagnostic studies: ? ?No results found. ? ? ? ? ? ? ? ? ? ? ? ? LOS: 3 days  ? ?Lavaughn Haberle  ?[facility]  ? ?Pager on [email or web address]. If 7PM-7AM, please contact night-coverage at [email or web address] ? ? ? ? ?01/28/2022, 2:47 PM  ? ? ? ? ? ? ? ? ? ?

## 2022-01-29 ENCOUNTER — Inpatient Hospital Stay: Payer: Medicare Other

## 2022-01-29 DIAGNOSIS — R4701 Aphasia: Secondary | ICD-10-CM

## 2022-01-29 DIAGNOSIS — I480 Paroxysmal atrial fibrillation: Secondary | ICD-10-CM

## 2022-01-29 DIAGNOSIS — I5032 Chronic diastolic (congestive) heart failure: Secondary | ICD-10-CM | POA: Diagnosis not present

## 2022-01-29 DIAGNOSIS — R29818 Other symptoms and signs involving the nervous system: Secondary | ICD-10-CM | POA: Diagnosis not present

## 2022-01-29 LAB — GLUCOSE, CAPILLARY
Glucose-Capillary: 65 mg/dL — ABNORMAL LOW (ref 70–99)
Glucose-Capillary: 239 mg/dL — ABNORMAL HIGH (ref 70–99)
Glucose-Capillary: 327 mg/dL — ABNORMAL HIGH (ref 70–99)
Glucose-Capillary: 236 mg/dL — ABNORMAL HIGH (ref 70–99)

## 2022-01-29 LAB — BASIC METABOLIC PANEL
Anion gap: 7 (ref 5–15)
BUN: 20 mg/dL (ref 8–23)
CO2: 32 mmol/L (ref 22–32)
Calcium: 9.1 mg/dL (ref 8.9–10.3)
Chloride: 105 mmol/L (ref 98–111)
Creatinine, Ser: 1.11 mg/dL — ABNORMAL HIGH (ref 0.44–1.00)
GFR, Estimated: 50 mL/min — ABNORMAL LOW (ref >60)
Glucose, Bld: 70 mg/dL (ref 70–99)
Potassium: 3.3 mmol/L — ABNORMAL LOW (ref 3.5–5.1)
Sodium: 144 mmol/L (ref 135–145)

## 2022-01-29 MED ORDER — LORAZEPAM 2 MG/ML IJ SOLN
1.0000 mg | Freq: Once | INTRAMUSCULAR | Status: AC | PRN
Start: 2022-01-29 — End: 2022-01-30
  Administered 2022-01-30: 1 mg via INTRAVENOUS
  Filled 2022-01-29: qty 1

## 2022-01-29 MED ORDER — POTASSIUM CHLORIDE CRYS ER 20 MEQ PO TBCR
40.0000 meq | EXTENDED_RELEASE_TABLET | Freq: Every day | ORAL | Status: DC
  Administered 2022-01-29 – 2022-02-01 (×4): 40 meq via ORAL
  Filled 2022-01-29 (×6): qty 2

## 2022-01-29 MED ORDER — IOHEXOL 350 MG/ML SOLN
75.0000 mL | Freq: Once | INTRAVENOUS | Status: AC | PRN
  Administered 2022-01-29: 75 mL via INTRAVENOUS

## 2022-01-29 MED ORDER — INSULIN GLARGINE-YFGN 100 UNIT/ML ~~LOC~~ SOLN
35.0000 [IU] | Freq: Every day | SUBCUTANEOUS | Status: DC
  Administered 2022-01-29 – 2022-02-02 (×5): 35 [IU] via SUBCUTANEOUS
  Filled 2022-01-29 (×6): qty 0.35

## 2022-01-29 MED ORDER — LEVETIRACETAM IN NACL 1000 MG/100ML IV SOLN
1000.0000 mg | Freq: Once | INTRAVENOUS | Status: AC
  Administered 2022-01-29: 1000 mg via INTRAVENOUS
  Filled 2022-01-29: qty 100

## 2022-01-29 NOTE — TOC Progression Note (Signed)
Transition of Care (TOC) - Progression Note  ? ? ?Patient Details  ?Name: Cheyenne Frank ?MRN: [id number] ?Date of Birth: July 08, 1941 ? ?Transition of Care (TOC) CM/SW Contact  ?Caryn Section, RN ?Phone Number: ?01/29/2022, 10:09 AM ? ?Clinical Narrative:   no bed offers, RNCM re sent referrals to facilities listed as pending ? ? ? ?Expected Discharge Plan: Skilled Nursing Facility ?Barriers to Discharge: Continued Medical Work up ? ?Expected Discharge Plan and Services ?Expected Discharge Plan: Skilled Nursing Facility ?  ?  ?  ?Living arrangements for the past 2 months: Single Family Home ?                ?  ?  ?  ?  ?  ?  ?  ?  ?  ?  ? ? ?Social Determinants of Health (SDOH) Interventions ?  ? ?Readmission Risk Interventions ? ?  01/28/2022  ?  2:28 PM  ?Readmission Risk Prevention Plan  ?Transportation Screening Complete  ?PCP or Specialist Appt within 3-5 Days Complete  ?HRI or Home Care Consult Complete  ?Social Work Consult for Recovery Care Planning/Counseling Complete  ?Palliative Care Screening Complete  ?Medication Review Oceanographer) Complete  ? ? ?

## 2022-01-29 NOTE — Progress Notes (Signed)
Inpatient Diabetes Program Recommendations ? ?AACE/ADA: New Consensus Statement on Inpatient Glycemic Control (2015) ? ?Target Ranges:  Prepandial:   less than 140 mg/dL ?     Peak postprandial:   less than 180 mg/dL (1-2 hours) ?     Critically ill patients:  140 - 180 mg/dL  ? ?Lab Results  ?Component Value Date  ? GLUCAP 65 (L) 01/29/2022  ? HGBA1C 8.5 (H) 10/26/2021  ? ? ?Review of Glycemic Control ? Latest Reference Range & Units 01/28/22 11:54 01/28/22 16:43 01/28/22 20:47 01/29/22 08:09  ?Glucose-Capillary 70 - 99 mg/dL 482 (H) 500 (H) 370 (H) 65 (L)  ? ?Diabetes history: DM 2 ?Outpatient Diabetes medications:  ?Lantus 64 units q HS ?Humalog 22 units tid with meals ?Januvia 50 mg daily ?Current orders for Inpatient glycemic control:  ?Novolog resistant tid with meals and HS ?Semglee 50 units q HS ?Inpatient Diabetes Program Recommendations:   ?Please consider reducing Semglee to 35 units q HS.  ?Also please reduce Novolog correction to moderate tid with meals and add Novolog 5 units tid with meals (hold if patient eats less than 50% or NPO).  ? ?Thanks,  ?Beryl Meager, RN, BC-ADM ?Inpatient Diabetes Coordinator ?Pager [phone]  (8a-5p) ? ? ?

## 2022-01-29 NOTE — Procedures (Signed)
History: 81 yo F with intermittent aphasia ? ?Sedation: None ? ?Technique: This EEG was acquired with electrodes placed according to the International 10-20 electrode system (including Fp1, Fp2, F3, F4, C3, C4, P3, P4, O1, O2, T3, T4, T5, T6, A1, A2, Fz, Cz, Pz). The following electrodes were missing or displaced: none. ? ? ?Background: The background consists of intermixed alpha and beta activities. There is a well defined posterior dominant rhythm of 9 Hz that attenuates with eye opening. Sleep is recorded with normal appearing structures.  ? ?Photic stimulation: Physiologic driving is not performed ? ?EEG Abnormalities: none ? ?Clinical Interpretation: This normal EEG is recorded in the waking and sleep state. There was no seizure or seizure predisposition recorded on this study. Please note that lack of epileptiform activity on EEG does not preclude the possibility of epilepsy.  ? ?Ritta Slot, MD ?[facility] ?[phone] ? ?If 7pm- 7am, please page neurology on call as listed in AMION. ? ?

## 2022-01-29 NOTE — Progress Notes (Signed)
Eeg done 

## 2022-01-29 NOTE — Progress Notes (Addendum)
? ? ? ? ?Progress Note  ? ? ?Cheyenne Frank  [id number] DOB: Feb 12, 1941  DOA: 01/24/2022 ?PCP: Scot Dock, [facility]  ? ? ? ? ?Brief Narrative:  ? ? ?Medical records reviewed and are as summarized below: ? ?Cheyenne Frank is a 81 y.o. female with medical history significant for Paroxysmal A-fib on Eliquis, prior stroke, HTN, insulin-dependent type 2 diabetes, sleep apnea, CKD stage IIIb, COPD, diastolic CHF, who presented to the hospital with getting her words out.  Reportedly, BP was 232/102 when EMS picked her up. ? ? ? ?Assessment/Plan:  ? ?Principal Problem: ?  Aphasia ?Active Problems: ?  Acute focal neurological deficit - possible acute CVA ?  History of CVA (cerebrovascular accident) ?  Hypertensive urgency ?  Uncontrolled type 2 diabetes mellitus with hyperglycemia, with long-term current use of insulin ([place]) ?  Sleep apnea ?  Stage 3b chronic kidney disease (CKD) ([place]) ?  COPD (chronic obstructive pulmonary disease) ([place]) ?  Chronic diastolic CHF (congestive heart failure) ([place]) ?  Paroxysmal atrial fibrillation (HCC) ?  Acute CVA (cerebrovascular accident) ([place]) ? ? ? ? ?Body mass index is 39.53 kg/m?.  (Obesity) ? ? ?Aphasia, suspected seizures, confusion: Stat CTA head and neck was done because of acute change in mental status today.  No evidence of stenosis was noted and no evidence of acute stroke.  EEG was repeated today but there was no evidence of epileptiform activity.  MRI brain has been ordered for further evaluation. Her daughter requested Ativan for sedation for MRI. ?Continue Keppra.  Appreciate help from Dr. Leonel Ramsay, neurologist. ? ?Hypertensive urgency, hypertension: Continue losartan and carvedilol ? ?Type II DM with severe hyperglycemia, hypoglycemia on 01/29/2022: Glucose dropped to 65 this morning.  Repeat glucose was 239 after treatment.  Decrease insulin glargine from 50 units to 35 units nightly and monitor glucose closely. ? ?According to admission med rec, she takes  Lantus 64 units nightly. ? ?Hypokalemia: Continue potassium repletion and monitor levels ? ?Paroxysmal atrial fibrillation with history of stroke: Continue metoprolol and Eliquis ? ?Chronic diastolic CHF: Compensated.  Continue antihypertensives ? ?COPD: Stable.  Continue bronchodilators ? ?Generalized weakness: PT and OT recommend discharge to SNF ? ?Other comorbidities include OSA, CKD stage IIIb ? ? ? ?Diet Order   ? ?       ?  DIET DYS 3 Room service appropriate? Yes with Assist; Fluid consistency: Thin  Diet effective now       ?  ? ?  ?  ? ?  ? ? ? ? ? ? ? ? ?Consultants: ?Neurologist ? ?Procedures: ?None ? ? ? ?Medications:  ? ?  stroke: early stages of recovery book   Does not apply Once  ? apixaban  5 mg Oral BID  ? aspirin EC  81 mg Oral Daily  ? carvedilol  25 mg Oral BID  ? insulin aspart  0-20 Units Subcutaneous TID WC  ? insulin aspart  0-5 Units Subcutaneous QHS  ? insulin glargine-yfgn  35 Units Subcutaneous QHS  ? levETIRAcetam  750 mg Oral BID  ? losartan  25 mg Oral Daily  ? multivitamin with minerals  1 tablet Oral Daily  ? oxybutynin  10 mg Oral Daily  ? pantoprazole  40 mg Oral Daily  ? PARoxetine  40 mg Oral QHS  ? potassium chloride  40 mEq Oral Daily  ? rosuvastatin  2.5 mg Oral QHS  ? ?Continuous Infusions: ? ? ? ? ?Anti-infectives (From admission, onward)  ? ?  None  ? ?  ? ? ? ? ? ? ? ? ? ?Family Communication/Anticipated D/C date and plan/Code Status  ? ?DVT prophylaxis:  ?apixaban (ELIQUIS) tablet 5 mg  ? ?  Code Status: Full Code ? ?Family Communication: Plan discussed with [name], daughter, at the bedside  ?Disposition Plan: Plan to discharge to SNF ? ? ?Status is: Inpatient ?Remains inpatient appropriate because: Altered mental status, suspected seizures ? ? ? ? ? ? ?Subjective:  ? ?Interval events noted.  Patient was fine earlier this morning when I rounded on her.  However, later in the day, she suddenly became confused and speech was slurred and aphasic.  She was also noticed to have  weakness in the right arm.  Her daughter was at the bedside.  Rapid response team was called to the bedside when patient developed sudden change in mental status.  Her symptoms improved after a few minutes and she was answering questions appropriately.  Dr. Leonel Ramsay, neurologist, and I were at the bedside to assess the patient when rapid response was called. ? ? ?Objective:  ? ? ?Vitals:  ? 01/29/22 0621 01/29/22 0737 01/29/22 1202 01/29/22 1211  ?BP: (!) 158/44 (!) 167/57 (!) 130/51 137/64  ?Pulse: 65 65 65 63  ?Resp:  20 16 18   ?Temp: 98.4 ?F (36.9 ?C) 98.1 ?F (36.7 ?C) 98.2 ?F (36.8 ?C) 98.2 ?F (36.8 ?C)  ?TempSrc:   Oral Oral  ?SpO2: 100% 98% 95% 100%  ?Weight:      ? ?No data found. ? ? ?Intake/Output Summary (Last 24 hours) at 01/29/2022 1621 ?Last data filed at 01/29/2022 1436 ?Gross per 24 hour  ?Intake 360 ml  ?Output --  ?Net 360 ml  ? ?Filed Weights  ? 01/25/22 1300  ?Weight: 111.1 kg  ? ? ?Exam: ? ?GEN: NAD ?SKIN: No rash ?EYES: EOMI ?ENT: MMM ?CV: RRR ?PULM: CTA B ?ABD: soft, obese, NT, +BS ?CNS: Alert but confused ?EXT: No edema or tenderness ? ? ? ? ? ? ?  ? ? ?Data Reviewed:  ? ?I have personally reviewed following labs and imaging studies: ? ?Labs: ?Labs show the following:  ? ?Basic Metabolic Panel: ?Recent Labs  ?Lab 01/24/22 ?1830 01/25/22 ?0423 01/27/22 ?[id number] 01/29/22 ?0527  ?NA 139  --  141 144  ?K 3.4*  --  3.4* 3.3*  ?CL 100  --  104 105  ?CO2 29  --  32 32  ?GLUCOSE 390*  --  148* 70  ?BUN 25*  --  18 20  ?CREATININE 1.30*  --  1.07* 1.11*  ?CALCIUM 8.8*  --  8.7* 9.1  ?MG  --  2.1 2.1  --   ?PHOS  --   --  3.2  --   ? ?GFR ?Estimated Creatinine Clearance: 51.1 mL/min (A) (by C-G formula based on SCr of 1.11 mg/dL (H)). ?Liver Function Tests: ?Recent Labs  ?Lab 01/24/22 ?1830  ?AST 33  ?ALT 21  ?ALKPHOS 82  ?BILITOT 0.9  ?PROT 6.5  ?ALBUMIN 3.2*  ? ?No results for input(s): LIPASE, AMYLASE in the last 168 hours. ?No results for input(s): AMMONIA in the last 168 hours. ?Coagulation  profile ?No results for input(s): INR, PROTIME in the last 168 hours. ? ?CBC: ?Recent Labs  ?Lab 01/24/22 ?1830  ?WBC 8.2  ?NEUTROABS 6.2  ?HGB 12.8  ?HCT 39.9  ?MCV 90.1  ?PLT 159  ? ?Cardiac Enzymes: ?No results for input(s): CKTOTAL, CKMB, CKMBINDEX, TROPONINI in the last 168 hours. ?BNP (last 3 results) ?No  results for input(s): PROBNP in the last 8760 hours. ?CBG: ?Recent Labs  ?Lab 01/28/22 ?1154 01/28/22 ?1643 01/28/22 ?2047 01/29/22 ?0809 01/29/22 ?1152  ?GLUCAP 407* 350* 252* 65* 239*  ? ?D-Dimer: ?No results for input(s): DDIMER in the last 72 hours. ?Hgb A1c: ?No results for input(s): HGBA1C in the last 72 hours. ?Lipid Profile: ?No results for input(s): CHOL, HDL, LDLCALC, TRIG, CHOLHDL, LDLDIRECT in the last 72 hours. ? ?Thyroid function studies: ?No results for input(s): TSH, T4TOTAL, T3FREE, THYROIDAB in the last 72 hours. ? ?Invalid input(s): FREET3 ?Anemia work up: ?No results for input(s): VITAMINB12, FOLATE, FERRITIN, TIBC, IRON, RETICCTPCT in the last 72 hours. ?Sepsis Labs: ?Recent Labs  ?Lab 01/24/22 ?1830  ?WBC 8.2  ? ? ?Microbiology ?No results found for this or any previous visit (from the past 240 hour(s)). ? ?Procedures and diagnostic studies: ? ?EEG adult ? ?Result Date: 01/29/2022 ?Greta Doom, MD     01/29/2022  4:01 PM History: 81 yo F with intermittent aphasia Sedation: None Technique: This EEG was acquired with electrodes placed according to the International 10-20 electrode system (including Fp1, Fp2, F3, F4, C3, C4, P3, P4, O1, O2, T3, T4, T5, T6, A1, A2, Fz, Cz, Pz). The following electrodes were missing or displaced: none. Background: The background consists of intermixed alpha and beta activities. There is a well defined posterior dominant rhythm of 9 Hz that attenuates with eye opening. Sleep is recorded with normal appearing structures. Photic stimulation: Physiologic driving is not performed EEG Abnormalities: none Clinical Interpretation: This normal EEG is recorded  in the waking and sleep state. There was no seizure or seizure predisposition recorded on this study. Please note that lack of epileptiform activity on EEG does not preclude the possibility of epilepsy. [place]

## 2022-01-29 NOTE — Progress Notes (Signed)
Subjective: ?Patient continues to have intermittent aphasia, speech has not been completely normal sometime, it worsened today. ? ?Exam: ?Vitals:  ? 01/29/22 1211 01/29/22 1628  ?BP: 137/64 (!) 156/51  ?Pulse: 63 75  ?Resp: 18 16  ?Temp: 98.2 ?F (36.8 ?C) 98.3 ?F (36.8 ?C)  ?SpO2: 100% 94%  ? ?Gen: In bed, NAD ?Resp: non-labored breathing, no acute distress ?Abd: soft, nt ? ?Neuro: ?MS: Awake, alert, able to repeat simple phrases, able to name watch and band ?CN: I do think she has a mild right facial weakness ?Motor: She has drift of the right upper extremity ?Sensory: Intact to light touch ? ? ?Pertinent Labs: ?Creatinine 1.1 ? ?Impression: 81 year old female with recurrent episodes of aphasia, sometimes accompanied by right facial weakness or arm weakness.  Stuttering TIA is a possibility, but given how long the symptoms have been stuttering for if this is cerebrovascular I would expect there to be change on MRI at this point.  I do think that Dr. Curly Shores was on the right track with possible seizure activity, though with her continuing to have episodes now after starting Keppra I will revisit this. ? ?Recommendations: ?1) EEG ?2) CTA head and neck ?3) if no explanation found on the above, repeat MRI of the brain. ?4) we will give one-time additional dose of Keppra. ? ?Roland Rack, MD ?[facility] ?[phone] ? ?If 7pm- 7am, please page neurology on call as listed in [place]. ? ?

## 2022-01-29 NOTE — Progress Notes (Signed)
?   01/29/22 1210  ?Clinical Encounter Type  ?Visited With Patient and family together;Health care provider  ?Visit Type Initial;Spiritual support;Social support;Other (Comment) ?(Rapid response)  ?Spiritual Encounters  ?Spiritual Needs Emotional  ? ?Chaplain Burris offerd compassionate, non-anxious presence. Offered support to Pt's daughter, [name], as her daughter is also facing surgery tomorrow. ? ?Will come back this afternoon to check on Pt's well-being. ?

## 2022-01-29 NOTE — Significant Event (Signed)
Contacted hospital operator to page code stroke. With lkw time of 0900 ?

## 2022-01-29 NOTE — Significant Event (Signed)
Rapid Response Event Note  ? ?Reason for Call :  ?Stroke like symptoms: garbled speech, right sided weakness, inability to recognize family member ? ?Initial Focused Assessment:  ?Rapid response RN arrived in patient's room with patient sitting in armchair surrounded by 1C staff, daughter, and AC. Per 1C staff and daughter, patient was unable to move her right arm, had severely garbled speech, and was unable to name her own daughter. Last known well at 09:00 when helped to bedside commode.  ? ?At time of this RN's arrival, pt had moderately garbled speech in which patient could be understood with difficulty (better than previous per Debi RN and patient's daughter) and patient could move right arm for some tasks but had weakness and drift. Vital signs stable see flowsheet). CBG at 11:52 239. Dr. Amada Jupiter and Dr. Myriam Forehand arrived shortly after this point. See their notes for their assessments. ? ?Interventions:  ?Helped patient back to bed and helped transport patient to CT for ordered imaging. ? ?Plan of Care:  ?Patient to remain on 1C for now. Debi RN to recall rapid response if needed. ? ?Event Summary:  ? ?MD Notified: Dr. Myriam Forehand and Dr. Amada Jupiter ?Call Time: 12:06 ?Arrival Time: 12:08 ?End Time: 12:46 ? ?Bennie Dallas, RN ?

## 2022-01-29 NOTE — Progress Notes (Signed)
PT Cancellation Note ? ?Patient Details ?Name: THESSALY MCCULLERS ?MRN: [id number] ?DOB: [birth year] ? ? ?Cancelled Treatment:    Reason Eval/Treat Not Completed: Other (comment). Pt with rapid response with code stroke. Not appropriate for PT mobility at this time. Will hold at this time. ? ? ?Padraic Marinos ?01/29/2022, 12:32 PM ?Elizabeth Palau, PT, DPT, GCS ?[phone] ? ?

## 2022-01-30 ENCOUNTER — Inpatient Hospital Stay: Payer: Medicare Other

## 2022-01-30 DIAGNOSIS — R4701 Aphasia: Secondary | ICD-10-CM | POA: Diagnosis not present

## 2022-01-30 DIAGNOSIS — R569 Unspecified convulsions: Secondary | ICD-10-CM

## 2022-01-30 DIAGNOSIS — G40909 Epilepsy, unspecified, not intractable, without status epilepticus: Secondary | ICD-10-CM

## 2022-01-30 LAB — GLUCOSE, CAPILLARY
Glucose-Capillary: 105 mg/dL — ABNORMAL HIGH (ref 70–99)
Glucose-Capillary: 226 mg/dL — ABNORMAL HIGH (ref 70–99)
Glucose-Capillary: 298 mg/dL — ABNORMAL HIGH (ref 70–99)
Glucose-Capillary: 303 mg/dL — ABNORMAL HIGH (ref 70–99)

## 2022-01-30 MED ORDER — LEVETIRACETAM 500 MG PO TABS
1000.0000 mg | ORAL_TABLET | Freq: Two times a day (BID) | ORAL | Status: DC
Start: 2022-01-30 — End: 2022-02-03
  Administered 2022-01-30 – 2022-02-03 (×8): 1000 mg via ORAL
  Filled 2022-01-30 (×8): qty 2

## 2022-01-30 MED ORDER — LEVETIRACETAM 250 MG PO TABS
250.0000 mg | ORAL_TABLET | Freq: Once | ORAL | Status: AC
  Administered 2022-01-30: 250 mg via ORAL
  Filled 2022-01-30: qty 1

## 2022-01-30 NOTE — Progress Notes (Signed)
PT Cancellation Note ? ?Patient Details ?Name: Cheyenne Frank ?MRN: [id number] ?DOB: May 10, 1941 ? ? ?Cancelled Treatment:    Reason Eval/Treat Not Completed: Patient declined, no reason specified Due to patient unable to keep eyes open due to increased fatigue/tired, will re-attempt at a later time/date as available and patient medically appropriate for PT. Thank you! ? ? ?Angelica Ran, PT  ?01/30/22. 2:13 PM ? ?

## 2022-01-30 NOTE — TOC Progression Note (Signed)
Transition of Care (TOC) - Progression Note  ? ? ?Patient Details  ?Name: [name] ?MRN: [id number] ?Date of Birth: 23-Sep-1940 ? ?Transition of Care (TOC) CM/SW Contact  ?Caryn Section, RN ?Phone Number: ?01/30/2022, 2:19 PM ? ?Clinical Narrative:   No bed offers.  Bed search extended to [facility]. ? ? ? ?Expected Discharge Plan: Skilled Nursing Facility ?Barriers to Discharge: Continued Medical Work up ? ?Expected Discharge Plan and Services ?Expected Discharge Plan: Skilled Nursing Facility ?  ?  ?  ?Living arrangements for the past 2 months: Single Family Home ?                ?  ?  ?  ?  ?  ?  ?  ?  ?  ?  ? ? ?Social Determinants of Health (SDOH) Interventions ?  ? ?Readmission Risk Interventions ? ?  01/28/2022  ?  2:28 PM  ?Readmission Risk Prevention Plan  ?Transportation Screening Complete  ?PCP or Specialist Appt within 3-5 Days Complete  ?HRI or Home Care Consult Complete  ?Social Work Consult for Recovery Care Planning/Counseling Complete  ?Palliative Care Screening Complete  ?Medication Review Oceanographer) Complete  ? ? ?

## 2022-01-30 NOTE — Progress Notes (Signed)
Physical Therapy Treatment ?Patient Details ?Name: Cheyenne Frank ?MRN: [id number] ?DOB: 10/11/1940 ?Today's Date: 01/30/2022 ? ? ?History of Present Illness Pt is an 82 y/o F admitted on 01/24/22 after presenting with c/o speaking, similar to prior strokes. Pt was outside of tPA window. Pt noted to have elevated BP, CT & MRI was negative for acute processes. Neurology has been consulted. PMH: paroxysmal a-fib on eliquis, stroke, HTN, insulin dependent DM2, sleep apnea, CKD 3B, COPD, diastolic CHF, fibromyalgia, anxiety, macular degeneration ? ?  ?PT Comments  ? ? Pt in recliner at beginning of session, sliding out. Assisted to stand and take steps over to bed. Follows commands and does fairly well, however very tired. Falls asleep easily once returned to bed. Attempted to perform there-ex. Delayed processing. Will continue to progress as able.   ?Recommendations for follow up therapy are one component of a multi-disciplinary discharge planning process, led by the attending physician.  Recommendations may be updated based on patient status, additional functional criteria and insurance authorization. ? ?Follow Up Recommendations ? Skilled nursing-short term rehab (<3 hours/day) ?  ?  ?Assistance Recommended at Discharge Frequent or constant Supervision/Assistance  ?Patient can return home with the following A lot of help with walking and/or transfers;A lot of help with bathing/dressing/bathroom;Assistance with feeding;Assistance with cooking/housework;Assist for transportation;Direct supervision/assist for financial management;Direct supervision/assist for medications management;Help with stairs or ramp for entrance ?  ?Equipment Recommendations ? None recommended by PT  ?  ?Recommendations for Other Services   ? ? ?  ?Precautions / Restrictions Precautions ?Precautions: Fall ?Restrictions ?Weight Bearing Restrictions: No  ?  ? ?Mobility ? Bed Mobility ?Overal bed mobility: Needs Assistance ?Bed Mobility: Sit to Supine ?   ?  ?  ?Sit to supine: Mod assist ?  ?General bed mobility comments: needs assist for sequencing ?  ? ?Transfers ?Overall transfer level: Needs assistance ?Equipment used: Rolling walker (2 wheels) ?Transfers: Sit to/from Stand ?Sit to Stand: Min assist ?  ?  ?  ?  ?  ?General transfer comment: cues for hand placement. Wide BOS ?  ? ?Ambulation/Gait ?Ambulation/Gait assistance: Min assist ?Gait Distance (Feet): 3 Feet ?Assistive device: Rolling walker (2 wheels) ?Gait Pattern/deviations: Step-to pattern ?  ?  ?  ?General Gait Details: steps over to bed, follows commands. ? ? ?Stairs ?  ?  ?  ?  ?  ? ? ?Wheelchair Mobility ?  ? ?Modified Rankin (Stroke Patients Only) ?  ? ? ?  ?Balance Overall balance assessment: Needs assistance ?Sitting-balance support: No upper extremity supported, Feet supported, Single extremity supported ?Sitting balance-Leahy Scale: Fair ?  ?  ?Standing balance support: Bilateral upper extremity supported, Reliant on assistive device for balance ?Standing balance-Leahy Scale: Fair ?  ?  ?  ?  ?  ?  ?  ?  ?  ?  ?  ?  ?  ? ?  ?Cognition Arousal/Alertness: Awake/alert ?Behavior During Therapy: [facility] for tasks assessed/performed ?Overall Cognitive Status: Impaired/Different from baseline ?  ?  ?  ?  ?  ?  ?  ?  ?  ?  ?  ?  ?  ?  ?  ?  ?General Comments: slow processing, confused ?  ?  ? ?  ?Exercises Other Exercises ?Other Exercises: attempted ther-ex, has difficulty following commands for movements ? ?  ?General Comments   ?  ?  ? ?Pertinent Vitals/Pain Pain Assessment ?Pain Assessment: No/denies pain  ? ? ?Home Living   ?  ?  ?  ?  ?  ?  ?  ?  ?  ?   ?  ?  Prior Function    ?  ?  ?   ? ?PT Goals (current goals can now be found in the care plan section) Acute Rehab PT Goals ?Patient Stated Goal: get stronger ?PT Goal Formulation: With family ?Time For Goal Achievement: 02/10/22 ?Potential to Achieve Goals: Fair ?Progress towards PT goals: Progressing toward goals ? ?  ?Frequency ? ? ? Min  2X/week ? ? ? ?  ?PT Plan Current plan remains appropriate  ? ? ?Co-evaluation   ?  ?  ?  ?  ? ?  ?AM-PAC PT "6 Clicks" Mobility   ?Outcome Measure ? Help needed turning from your back to your side while in a flat bed without using bedrails?: A Little ?Help needed moving from lying on your back to sitting on the side of a flat bed without using bedrails?: A Little ?Help needed moving to and from a bed to a chair (including a wheelchair)?: A Lot ?Help needed standing up from a chair using your arms (e.g., wheelchair or bedside chair)?: A Lot ?Help needed to walk in hospital room?: A Lot ?Help needed climbing 3-5 steps with a railing? : Total ?6 Click Score: 13 ? ?  ?End of Session Equipment Utilized During Treatment: Gait belt ?Activity Tolerance: Patient tolerated treatment well ?Patient left: in bed;with call bell/phone within reach;with bed alarm set;with family/visitor present ?Nurse Communication: Mobility status ?PT Visit Diagnosis: Muscle weakness (generalized) (M62.81);Difficulty in walking, not elsewhere classified (R26.2);Unsteadiness on feet (R26.81) ?  ? ? ?Time: 3299-2426 ?PT Time Calculation (min) (ACUTE ONLY): 11 min ? ?Charges:  $Therapeutic Activity: 8-22 mins          ?          ? ?Elizabeth Palau, PT, DPT, GCS ?[phone] ? ? ? ?Fara Worthy ?01/30/2022, 5:07 PM ? ?

## 2022-01-30 NOTE — Progress Notes (Signed)
Occupational Therapy Treatment ?Patient Details ?Name: Cheyenne Frank ?MRN: [id number] ?DOB: 05-05-1941 ?Today's Date: 01/30/2022 ? ? ?History of present illness Pt is an 81 y/o F admitted on 01/24/22 after presenting with c/o speaking, similar to prior strokes. Pt was outside of tPA window. Pt noted to have elevated BP, CT & MRI was negative for acute processes. Neurology has been consulted. PMH: paroxysmal a-fib on eliquis, stroke, HTN, insulin dependent DM2, sleep apnea, CKD 3B, COPD, diastolic CHF, fibromyalgia, anxiety, macular degeneration ?  ?OT comments ? Pt seen for OT tx this date. Pt seated EOB finishing breakfast upon OT's arrival. Pt pleasant and agreeable. Pt oriented x3 (not to situation). Pt attempted to use a fork upside down to scoop oatmeal, requiring cues to suggest a more appropriate utensil/strategy. When asked if a knife might be better she stated "It might be." When cued to find her spoon on the tray, pt did so with extra time and then was able to scoop with it. Slow processing, decreased safety awareness and awareness of deficits. Appears slower than previous session. Unable to recall what she had for breaikfast despite visual cues on plate of remnants of pancakes and omelet. Pt completed ADL transfers with MIN A + VC for hand placement/RW use. Attempted to sit prematurely requiring VC and TC to correct to maximize safety. Pt then reported urgent need to use BSC. Pt completed SPT with RW requiring MIN A. Pt had large bowel movement and was able to urinate. RN assist for pericare in standing while OT provided CGA in standing with additional VC for RW mgt and hand placement to maximize static standing balance. Pt then returned to the recliner. Pt noted with nasal cannula off at start of session. Pt found on room air, SpO2 85-89%. Nasal cannula replaced and SpO2 improved to >95%. Pt educated in not removing it. RN notified. Pt continues to benefit from skilled OT services, continue to recommend  SNF.   ? ?Recommendations for follow up therapy are one component of a multi-disciplinary discharge planning process, led by the attending physician.  Recommendations may be updated based on patient status, additional functional criteria and insurance authorization. ?   ?Follow Up Recommendations ? Skilled nursing-short term rehab (<3 hours/day)  ?  ?Assistance Recommended at Discharge Frequent or constant Supervision/Assistance  ?Patient can return home with the following ? Assistance with cooking/housework;Assist for transportation;Help with stairs or ramp for entrance;A lot of help with bathing/dressing/bathroom;A lot of help with walking and/or transfers;Direct supervision/assist for medications management ?  ?Equipment Recommendations ? BSC/3in1;Other (comment) (2WW; bariatric)  ?  ?Recommendations for Other Services   ? ?  ?Precautions / Restrictions Precautions ?Precautions: Fall ?Restrictions ?Weight Bearing Restrictions: No  ? ? ?  ? ?Mobility Bed Mobility ?  ?  ?  ?  ?  ?  ?  ?General bed mobility comments: seated EOB upon OT's arrival ?  ? ?Transfers ?Overall transfer level: Needs assistance ?Equipment used: Rolling walker (2 wheels) ?Transfers: Sit to/from Stand ?Sit to Stand: Min assist ?  ?  ?  ?  ?  ?General transfer comment: VC for hand placement on RW ?  ?  ?Balance Overall balance assessment: Needs assistance ?Sitting-balance support: No upper extremity supported, Feet supported, Single extremity supported ?Sitting balance-Leahy Scale: Fair ?Sitting balance - Comments: sat EOB to eat, intermittently will prop up again elevated HOB with RUE ?  ?Standing balance support: Bilateral upper extremity supported, Reliant on assistive device for balance ?Standing balance-Leahy Scale: Fair ?  ?  ?  ?  ?  ?  ?  ?  ?  ?  ?  ?  ?   ? ?  ADL either performed or assessed with clinical judgement  ? ?ADL Overall ADL's : Needs assistance/impaired ?Eating/Feeding: Sitting;Supervision/ safety ?Eating/Feeding Details  (indicate cue type and reason): Pt attempted to use a fork upside down to scoop oatmeal, requiring cues to suggest a more appropriate utensil. When asked if a knife might be better she stated "It might be." When cued to find her spoon, pt did so with extra time and then able to scoop with it.  Unable to recall what she had for breaikfast despite visual cues on plate of remnants of pancakes and omelet. ?  ?  ?  ?  ?  ?  ?  ?  ?  ?  ?Toilet Transfer: Minimal assistance;Moderate assistance;BSC/3in1;Rolling walker (2 wheels);Requires wide/bariatric ?Toilet Transfer Details (indicate cue type and reason): VC for hand placement on RW ?Toileting- Clothing Manipulation and Hygiene: Maximal assistance;Sit to/from stand ?Toileting - Clothing Manipulation Details (indicate cue type and reason): CGA from OT for standing balance and MAX A from RN for pericare ?  ?  ?Functional mobility during ADLs: Minimal assistance;Min guard;Cueing for sequencing;Cueing for safety;Rolling walker (2 wheels) ?  ?  ? ?Extremity/Trunk Assessment   ?  ?  ?  ?  ?  ? ?Vision   ?  ?  ?Perception   ?  ?Praxis   ?  ? ?Cognition Arousal/Alertness: Awake/alert ?[facility] for tasks assessed/performed ?Overall Cognitive Status: Impaired/Different from baseline ?Area of Impairment: Orientation, Problem solving, Attention, Following commands, Safety/judgement, Awareness, Memory ?  ?  ?  ?  ?  ?  ?  ?  ?Orientation Level: Disoriented to, Situation ?Current Attention Level: Focused ?Memory: Decreased recall of precautions, Decreased short-term memory ?Following Commands: Follows one step commands inconsistently, Follows one step commands with increased time ?Safety/Judgement: Decreased awareness of safety, Decreased awareness of deficits ?Awareness: Intellectual ?Problem Solving: Slow processing, Requires verbal cues, Decreased initiation, Difficulty sequencing, Requires tactile cues ?General Comments: Slow processing, decreased safety  awareness and awareness of deficits. Appears more impaired than previous session. ?  ?  ?   ?Exercises   ? ?  ?Shoulder Instructions   ? ? ?  ?General Comments Pt found on room air, SpO2 85-89%. Nasal cannula replaced and SpO2 improved to >95%. Pt educated in not removing it. RN notified.  ? ? ?Pertinent Vitals/ Pain       Pain Assessment ?Pain Assessment: No/denies pain ? ?Home Living   ?  ?  ?  ?  ?  ?  ?  ?  ?  ?  ?  ?  ?  ?  ?  ?  ?  ?  ? ?  ?Prior Functioning/Environment    ?  ?  ?  ?   ? ?Frequency ? Min 2X/week  ? ? ? ? ?  ?Progress Toward Goals ? ?OT Goals(current goals can now be found in the care plan section) ? Progress towards OT goals: OT to reassess next treatment ? ?Acute Rehab OT Goals ?Patient Stated Goal: get better and go home ?OT Goal Formulation: With patient/family ?Time For Goal Achievement: 02/09/22 ?Potential to Achieve Goals: Good  ?Plan Discharge plan remains appropriate;Frequency remains appropriate   ? ?Co-evaluation ? ? ?   ?  ?  ?  ?  ? ?  ?AM-PAC OT "6 Clicks" Daily Activity     ?Outcome Measure ? ? Help from another person eating meals?: A Little ?Help from another person taking care of personal grooming?: A Little ?Help from another person  toileting, which includes using toliet, bedpan, or urinal?: A Lot ?Help from another person bathing (including washing, rinsing, drying)?: A Lot ?Help from another person to put on and taking off regular upper body clothing?: A Little ?Help from another person to put on and taking off regular lower body clothing?: A Lot ?6 Click Score: 15 ? ?  ?End of Session Equipment Utilized During Treatment: Rolling walker (2 wheels);Oxygen ? ?OT Visit Diagnosis: Other abnormalities of gait and mobility (R26.89);Muscle weakness (generalized) (M62.81);History of falling (Z91.81) ?  ?Activity Tolerance Patient tolerated treatment well ?  ?Patient Left in chair;with call bell/phone within reach;with chair alarm set;Other (comment) (purewick in place) ?  ?Nurse  Communication Mobility status ?  ? ?   ? ?Time: 9509-3267 ?OT Time Calculation (min): 28 min ? ?Charges: OT General Charges ?$OT Visit: 1 Visit ?OT Treatments ?$Self Care/Home Management : 23-37 mins ? ?Arman Filter., M

## 2022-01-30 NOTE — Progress Notes (Signed)
Inpatient Diabetes Program Recommendations ? ?AACE/ADA: New Consensus Statement on Inpatient Glycemic Control  ?Target Ranges:  Prepandial:   less than 140 mg/dL ?     Peak postprandial:   less than 180 mg/dL (1-2 hours) ?     Critically ill patients:  140 - 180 mg/dL  ? ? Latest Reference Range & Units 01/29/22 08:09 01/29/22 11:52 01/29/22 16:37 01/29/22 20:38 01/30/22 07:21  ?Glucose-Capillary 70 - 99 mg/dL 65 (L) 829 (H) 562 (H) 236 (H) 105 (H)  ? ?Review of Glycemic Control ? ?Diabetes history: DM2 ?Outpatient Diabetes medications: Lantus 64 units QHS, Humalog 22 units TID with meals, Januvia 50 mg daily ?Current orders for Inpatient glycemic control: Semglee 35 units QHS, Novolog 0-20 units TID with meals, Novolog 0-5 units QHS ? ?Inpatient Diabetes Program Recommendations:   ? ?Insulin: Fasting 65 mg/dl on 10/16/84 and Semglee decreased from 50 to 35 units QHS on 01/29/22.  Please consider ordering Novolog 5 units TID with meals for meal coverage if patient eats at least 50% of meals. ? ?Thanks, ?Orlando Penner, RN, MSN, CDE ?Diabetes Coordinator ?Inpatient Diabetes Program ?[phone] (Team Pager from 8am to 5pm) ? ? ?

## 2022-01-30 NOTE — Progress Notes (Addendum)
Subjective: ?Patient with no further episodes ? ?Exam: ?Vitals:  ? 01/29/22 2337 01/30/22 0441  ?BP: (!) 153/64 (!) 159/66  ?Pulse: 72 64  ?Resp: 20 20  ?Temp: 98.3 ?F (36.8 ?C) 97.8 ?F (36.6 ?C)  ?SpO2: 94% 93%  ? ?Gen: In bed, NAD ?Resp: non-labored breathing, no acute distress ?Abd: soft, nt ? ?Neuro: ?MS: Awake, alert, speech is more fluent than yesterday ?CN: facial weakness is resolved, VFF ?Motor: ? Very mild RUE drift, but better than yesterday.  ?Sensory: Intact to light touch ? ? ?Pertinent Labs: ?Creatinine 1.1 ? ?Impression: 81 year old female with recurrent episodes of aphasia, sometimes accompanied by right facial weakness or arm weakness.  Stuttering TIA was  considered a possibility, but given how long the symptoms have been stuttering with negative repeat MRI, as well as the fact that she appears to be amnestic to the episodes, I think that Dr. Iver Nestle was on the right track with possible seizure activity. EEG is still negative, but would favor increasing keppra. If she continues to have spells, we may need to consider an admission for spell characterization.  ? ?Recommendations: ?1) Increase keppra to 1g BID ?2) If any further spells, will need to consider LTM-EEG ? ?Ritta Slot, MD ?[facility] ?[phone] ? ?If 7pm- 7am, please page neurology on call as listed in AMION. ? ?

## 2022-01-30 NOTE — Progress Notes (Addendum)
? ? ? ? ?Progress Note  ? ? ?Cheyenne Frank  [id number] DOB: 1941/01/13  DOA: 01/24/2022 ?PCP: [facility], [facility]  ? ? ? ? ?Brief Narrative:  ? ? ?Medical records reviewed and are as summarized below: ? ?Cheyenne Frank is a 81 y.o. female with medical history significant for Paroxysmal A-fib on Eliquis, prior stroke, HTN, insulin-dependent type 2 diabetes, sleep apnea, CKD stage IIIb, COPD, diastolic CHF, who presented to the hospital with getting her words out.  Reportedly, BP was 232/102 when EMS picked her up. ? ? ? ?Assessment/Plan:  ? ?Principal Problem: ?  Aphasia ?Active Problems: ?  History of CVA (cerebrovascular accident) ?  Hypertensive urgency ?  Uncontrolled type 2 diabetes mellitus with hyperglycemia, with long-term current use of insulin ([place]) ?  Sleep apnea ?  Stage 3b chronic kidney disease (CKD) ([place]) ?  COPD (chronic obstructive pulmonary disease) ([place]) ?  Chronic diastolic CHF (congestive heart failure) ([place]) ?  Paroxysmal atrial fibrillation (H[facility]) ?  Seizure ([place]) ? ? ? ? ?Body mass index is 39.53 kg/m?.  (Obesity) ? ? ?Aphasia, suspected seizures, confusion: No evidence of acute stroke on MRI brain.  Continue Keppra.  Follow-up with neurologist. ? ?Hypertensive urgency, hypertension: Continue losartan, hydralazine and carvedilol ? ?Type II DM with severe hyperglycemia, hypoglycemia on 01/29/2022: Continue insulin glargine and NovoLog as needed. ?According to admission med rec, she takes Lantus 64 units nightly. ? ?Hypokalemia: Repeat BMP tomorrow ? ?Paroxysmal atrial fibrillation with history of stroke: Continue metoprolol and Eliquis ? ?Chronic diastolic CHF: Compensated.  Continue antihypertensives ? ?COPD: Stable.  Continue bronchodilators ? ?Generalized weakness: PT and OT recommend discharge to SNF ? ?Other comorbidities include OSA, CKD stage IIIb ? ? ? ?Diet Order   ? ?       ?  DIET DYS 3 Room service appropriate? Yes with Assist; Fluid consistency: Thin  Diet effective  now       ?  ? ?  ?  ? ?  ? ? ? ? ? ? ? ? ?Consultants: ?Neurologist ? ?Procedures: ?None ? ? ? ?Medications:  ? ?  stroke: early stages of recovery book   Does not apply Once  ? apixaban  5 mg Oral BID  ? aspirin EC  81 mg Oral Daily  ? carvedilol  25 mg Oral BID  ? insulin aspart  0-20 Units Subcutaneous TID WC  ? insulin aspart  0-5 Units Subcutaneous QHS  ? insulin glargine-yfgn  35 Units Subcutaneous QHS  ? levETIRAcetam  1,000 mg Oral BID  ? losartan  25 mg Oral Daily  ? multivitamin with minerals  1 tablet Oral Daily  ? oxybutynin  10 mg Oral Daily  ? pantoprazole  40 mg Oral Daily  ? PARoxetine  40 mg Oral QHS  ? potassium chloride  40 mEq Oral Daily  ? rosuvastatin  2.5 mg Oral QHS  ? ?Continuous Infusions: ? ? ? ? ?Anti-infectives (From admission, onward)  ? ? None  ? ?  ? ? ? ? ? ? ? ? ? ?Family Communication/Anticipated D/C date and plan/Code Status  ? ?DVT prophylaxis:  ?apixaban (ELIQUIS) tablet 5 mg  ? ?  Code Status: Full Code ? ?Family Communication: Discussed plan of care with her daughter, Cheyenne Frank, over the phone ?Disposition Plan: Plan to discharge to SNF ? ? ?Status is: Inpatient ?Remains inpatient appropriate because: Altered mental status, suspected seizures ? ? ? ? ? ? ?Subjective:  ? ?Interval events noted.  She  feels better today.  She does not remember events that transpired yesterday. ? ? ?Objective:  ? ? ?Vitals:  ? 01/29/22 2154 01/29/22 2337 01/30/22 0441 01/30/22 1200  ?BP: (!) 200/76 (!) 153/64 (!) 159/66 (!) 174/61  ?Pulse:  72 64 66  ?Resp:  20 20 18   ?Temp:  98.3 ?F (36.8 ?C) 97.8 ?F (36.6 ?C) 97.9 ?F (36.6 ?C)  ?TempSrc:    Oral  ?SpO2:  94% 93% 100%  ?Weight:      ? ?No data found. ? ? ?Intake/Output Summary (Last 24 hours) at 01/30/2022 1318 ?Last data filed at 01/30/2022 1102 ?Gross per 24 hour  ?Intake 240 ml  ?Output 450 ml  ?Net -210 ml  ? ?Filed Weights  ? 01/25/22 1300  ?Weight: 111.1 kg  ? ? ?Exam: ? ?GEN: NAD ?SKIN: No rash ?EYES: EOMI ?ENT: MMM ?CV: RRR ?PULM: CTA B ?ABD:  soft, obese, NT, +BS ?CNS: AAO x 2 (person and place), non focal ?EXT: No edema or tenderness ? ? ? ? ?  ? ? ?Data Reviewed:  ? ?I have personally reviewed following labs and imaging studies: ? ?Labs: ?Labs show the following:  ? ?Basic Metabolic Panel: ?Recent Labs  ?Lab 01/24/22 ?1830 01/25/22 ?0423 01/27/22 ?[id number] 01/29/22 ?0527  ?NA 139  --  141 144  ?K 3.4*  --  3.4* 3.3*  ?CL 100  --  104 105  ?CO2 29  --  32 32  ?GLUCOSE 390*  --  148* 70  ?BUN 25*  --  18 20  ?CREATININE 1.30*  --  1.07* 1.11*  ?CALCIUM 8.8*  --  8.7* 9.1  ?MG  --  2.1 2.1  --   ?PHOS  --   --  3.2  --   ? ?GFR ?Estimated Creatinine Clearance: 51.1 mL/min (A) (by C-G formula based on SCr of 1.11 mg/dL (H)). ?Liver Function Tests: ?Recent Labs  ?Lab 01/24/22 ?1830  ?AST 33  ?ALT 21  ?ALKPHOS 82  ?BILITOT 0.9  ?PROT 6.5  ?ALBUMIN 3.2*  ? ?No results for input(s): LIPASE, AMYLASE in the last 168 hours. ?No results for input(s): AMMONIA in the last 168 hours. ?Coagulation profile ?No results for input(s): INR, PROTIME in the last 168 hours. ? ?CBC: ?Recent Labs  ?Lab 01/24/22 ?1830  ?WBC 8.2  ?NEUTROABS 6.2  ?HGB 12.8  ?HCT 39.9  ?MCV 90.1  ?PLT 159  ? ?Cardiac Enzymes: ?No results for input(s): CKTOTAL, CKMB, CKMBINDEX, TROPONINI in the last 168 hours. ?BNP (last 3 results) ?No results for input(s): PROBNP in the last 8760 hours. ?CBG: ?Recent Labs  ?Lab 01/29/22 ?1152 01/29/22 ?1637 01/29/22 ?2038 01/30/22 ?0721 01/30/22 ?1124  ?GLUCAP 239* 327* 236* 105* 226*  ? ?D-Dimer: ?No results for input(s): DDIMER in the last 72 hours. ?Hgb A1c: ?No results for input(s): HGBA1C in the last 72 hours. ?Lipid Profile: ?No results for input(s): CHOL, HDL, LDLCALC, TRIG, CHOLHDL, LDLDIRECT in the last 72 hours. ? ?Thyroid function studies: ?No results for input(s): TSH, T4TOTAL, T3FREE, THYROIDAB in the last 72 hours. ? ?Invalid input(s): FREET3 ?Anemia work up: ?No results for input(s): VITAMINB12, FOLATE, FERRITIN, TIBC, IRON, RETICCTPCT in the last 72  hours. ?Sepsis Labs: ?Recent Labs  ?Lab 01/24/22 ?1830  ?WBC 8.2  ? ? ?Microbiology ?No results found for this or any previous visit (from the past 240 hour(s)). ? ?Procedures and diagnostic studies: ? ?MR BRAIN WO CONTRAST ? ?Result Date: 01/30/2022 ?CLINICAL DATA:  Altered mental status and acute neurologic deficit EXAM: MRI  HEAD WITHOUT CONTRAST TECHNIQUE: Multiplanar, multiecho pulse sequences of the brain and surrounding structures were obtained without intravenous contrast. COMPARISON:  01/24/2022 FINDINGS: Severely motion degraded examination. Brain: No acute infarct, mass effect or extra-axial collection. Small vessel infarct within the ventral left thalamus is unchanged. There is multifocal hyperintense T2-weighted signal within the white matter. Generalized cerebral volume loss. The midline structures are normal. Vascular: Major flow voids are preserved. Skull and upper cervical spine: Normal calvarium and skull base. Visualized upper cervical spine and soft tissues are normal. Sinuses/Orbits:No paranasal sinus fluid levels or advanced mucosal thickening. No mastoid or middle ear effusion. Normal orbits. IMPRESSION: 1. Severely motion degraded examination. 2. No acute intracranial abnormality. 3. Unchanged small vessel infarct within the ventral left thalamus. Electronically Signed   By: Ulyses Jarred M.D.   On: 01/30/2022 01:34  ? ?EEG adult ? ?Result Date: 01/29/2022 ?Greta Doom, MD     01/29/2022  4:01 PM History: 81 yo F with intermittent aphasia Sedation: None Technique: This EEG was acquired with electrodes placed according to the International 10-20 electrode system (including Fp1, Fp2, F3, F4, C3, C4, P3, P4, O1, O2, T3, T4, T5, T6, A1, A2, Fz, Cz, Pz). The following electrodes were missing or displaced: none. Background: The background consists of intermixed alpha and beta activities. There is a well defined posterior dominant rhythm of 9 Hz that attenuates with eye opening. Sleep is  recorded with normal appearing structures. Photic stimulation: Physiologic driving is not performed EEG Abnormalities: none Clinical Interpretation: This normal EEG is recorded in the waking and sleep state. T

## 2022-01-31 LAB — URINALYSIS, COMPLETE (UACMP) WITH MICROSCOPIC
Bilirubin Urine: NEGATIVE
Glucose, UA: NEGATIVE mg/dL
Ketones, ur: NEGATIVE mg/dL
Nitrite: POSITIVE — AB
Protein, ur: 100 mg/dL — AB
Specific Gravity, Urine: 1.008 (ref 1.005–1.030)
Squamous Epithelial / HPF: NONE SEEN (ref 0–5)
WBC, UA: >50 WBC/hpf — ABNORMAL HIGH (ref 0–5)
pH: 5 (ref 5.0–8.0)

## 2022-01-31 LAB — BASIC METABOLIC PANEL
Anion gap: 9 (ref 5–15)
BUN: 22 mg/dL (ref 8–23)
CO2: 31 mmol/L (ref 22–32)
Calcium: 9.1 mg/dL (ref 8.9–10.3)
Chloride: 103 mmol/L (ref 98–111)
Creatinine, Ser: 1.19 mg/dL — ABNORMAL HIGH (ref 0.44–1.00)
GFR, Estimated: 46 mL/min — ABNORMAL LOW (ref >60)
Glucose, Bld: 205 mg/dL — ABNORMAL HIGH (ref 70–99)
Potassium: 4.1 mmol/L (ref 3.5–5.1)
Sodium: 143 mmol/L (ref 135–145)

## 2022-01-31 LAB — GLUCOSE, CAPILLARY
Glucose-Capillary: 188 mg/dL — ABNORMAL HIGH (ref 70–99)
Glucose-Capillary: 250 mg/dL — ABNORMAL HIGH (ref 70–99)
Glucose-Capillary: 209 mg/dL — ABNORMAL HIGH (ref 70–99)
Glucose-Capillary: 297 mg/dL — ABNORMAL HIGH (ref 70–99)

## 2022-01-31 LAB — MAGNESIUM: Magnesium: 2.1 mg/dL (ref 1.7–2.4)

## 2022-01-31 MED ORDER — SODIUM CHLORIDE 0.9 % IV SOLN
1.0000 g | INTRAVENOUS | Status: DC
  Administered 2022-01-31 – 2022-02-02 (×3): 1 g via INTRAVENOUS
  Filled 2022-01-31 (×2): qty 10
  Filled 2022-01-31: qty 1

## 2022-01-31 MED ORDER — TORSEMIDE 20 MG PO TABS
20.0000 mg | ORAL_TABLET | Freq: Every day | ORAL | Status: DC
  Administered 2022-01-31 – 2022-02-02 (×3): 20 mg via ORAL
  Filled 2022-01-31 (×4): qty 1

## 2022-01-31 MED ORDER — HYDRALAZINE HCL 50 MG PO TABS
25.0000 mg | ORAL_TABLET | Freq: Three times a day (TID) | ORAL | Status: DC
Start: 2022-01-31 — End: 2022-02-03
  Administered 2022-01-31 – 2022-02-03 (×9): 25 mg via ORAL
  Filled 2022-01-31 (×10): qty 1

## 2022-01-31 MED ORDER — GUAIFENESIN-DM 100-10 MG/5ML PO SYRP: 5.0000 mL | ORAL_SOLUTION | Freq: Three times a day (TID) | ORAL | Status: DC | PRN

## 2022-01-31 NOTE — Progress Notes (Signed)
Physical Therapy Treatment ?Patient Details ?Name: Cheyenne Frank ?MRN: [id number] ?DOB: 26-Oct-1940 ?Today's Date: 01/31/2022 ? ? ?History of Present Illness Pt is an 81 y/o F admitted on 01/24/22 after presenting with c/o speaking, similar to prior strokes. Pt was outside of tPA window. Pt noted to have elevated BP, CT & MRI was negative for acute processes. Neurology has been consulted. PMH: paroxysmal a-fib on eliquis, stroke, HTN, insulin dependent DM2, sleep apnea, CKD 3B, COPD, diastolic CHF, fibromyalgia, anxiety, macular degeneration ? ?  ?PT Comments  ? ? Pt is making good progress towards goals with ability to ambulate short distance in room to recliner. RW used with ability to follow commands. Able to perform some there-ex in chair, limited by attention to task. Will continue to progress as able.  ?Recommendations for follow up therapy are one component of a multi-disciplinary discharge planning process, led by the attending physician.  Recommendations may be updated based on patient status, additional functional criteria and insurance authorization. ? ?Follow Up Recommendations ? Skilled nursing-short term rehab (<3 hours/day) ?  ?  ?Assistance Recommended at Discharge Frequent or constant Supervision/Assistance  ?Patient can return home with the following A lot of help with walking and/or transfers;A lot of help with bathing/dressing/bathroom;Assistance with feeding;Assistance with cooking/housework;Assist for transportation;Direct supervision/assist for financial management;Direct supervision/assist for medications management;Help with stairs or ramp for entrance ?  ?Equipment Recommendations ? None recommended by PT  ?  ?Recommendations for Other Services   ? ? ?  ?Precautions / Restrictions Precautions ?Precautions: Fall ?Restrictions ?Weight Bearing Restrictions: No  ?  ? ?Mobility ? Bed Mobility ?Overal bed mobility: Needs Assistance ?Bed Mobility: Supine to Sit ?  ?  ?Supine to sit: Min assist ?  ?   ?General bed mobility comments: needs assist with trunkal elevation. Once seated, able to maintain balance ?  ? ?Transfers ?Overall transfer level: Needs assistance ?Equipment used: Rolling walker (2 wheels) ?Transfers: Sit to/from Stand ?Sit to Stand: Min guard ?  ?  ?  ?  ?  ?General transfer comment: safe technique with cues for sequencing. Bed slightly elevated ?  ? ?Ambulation/Gait ?Ambulation/Gait assistance: Min assist ?Gait Distance (Feet): 4 Feet ?Assistive device: Rolling walker (2 wheels) ?Gait Pattern/deviations: Step-to pattern ?  ?  ?  ?General Gait Details: ambulated from bed->chair with ability to follow commands and use RW safely. Slight cues for sequencing. Further distance limited due to MD entering room ? ? ?Stairs ?  ?  ?  ?  ?  ? ? ?Wheelchair Mobility ?  ? ?Modified Rankin (Stroke Patients Only) ?  ? ? ?  ?Balance Overall balance assessment: Needs assistance ?Sitting-balance support: No upper extremity supported, Feet supported, Single extremity supported ?Sitting balance-Leahy Scale: Fair ?  ?  ?Standing balance support: Bilateral upper extremity supported, Reliant on assistive device for balance ?Standing balance-Leahy Scale: Fair ?  ?  ?  ?  ?  ?  ?  ?  ?  ?  ?  ?  ?  ? ?  ?Cognition Arousal/Alertness: Awake/alert ?Behavior During Therapy: [facility] for tasks assessed/performed ?Overall Cognitive Status: Impaired/Different from baseline ?  ?  ?  ?  ?  ?  ?  ?  ?  ?  ?  ?  ?  ?  ?  ?  ?General Comments: slightly improved ability to carry conversation ?  ?  ? ?  ?Exercises Other Exercises ?Other Exercises: seated ther-ex performed on B LE including LAQ, B UE shoulder raises, and alt  marching. 10 reps with supervision and attention to task as she becomes easily distracted. ? ?  ?General Comments   ?  ?  ? ?Pertinent Vitals/Pain Pain Assessment ?Pain Assessment: No/denies pain  ? ? ?Home Living   ?  ?  ?  ?  ?  ?  ?  ?  ?  ?   ?  ?Prior Function    ?  ?  ?   ? ?PT Goals (current goals can now be  found in the care plan section) Acute Rehab PT Goals ?Patient Stated Goal: get stronger ?PT Goal Formulation: With family ?Time For Goal Achievement: 02/10/22 ?Potential to Achieve Goals: Fair ?Progress towards PT goals: Progressing toward goals ? ?  ?Frequency ? ? ? Min 2X/week ? ? ? ?  ?PT Plan Current plan remains appropriate  ? ? ?Co-evaluation   ?  ?  ?  ?  ? ?  ?AM-PAC PT "6 Clicks" Mobility   ?Outcome Measure ? Help needed turning from your back to your side while in a flat bed without using bedrails?: A Little ?Help needed moving from lying on your back to sitting on the side of a flat bed without using bedrails?: A Little ?Help needed moving to and from a bed to a chair (including a wheelchair)?: A Little ?Help needed standing up from a chair using your arms (e.g., wheelchair or bedside chair)?: A Little ?Help needed to walk in hospital room?: A Lot ?Help needed climbing 3-5 steps with a railing? : Total ?6 Click Score: 15 ? ?  ?End of Session Equipment Utilized During Treatment: Gait belt ?Activity Tolerance: Patient tolerated treatment well ?Patient left: in chair;with chair alarm set;with family/visitor present ?Nurse Communication: Mobility status ?PT Visit Diagnosis: Muscle weakness (generalized) (M62.81);Difficulty in walking, not elsewhere classified (R26.2);Unsteadiness on feet (R26.81) ?  ? ? ?Time: [id number] ?PT Time Calculation (min) (ACUTE ONLY): 11 min ? ?Charges:  $Therapeutic Exercise: 8-22 mins          ?          ? ?Greggory Stallion, PT, DPT, GCS ?[phone] ? ? ? ?Jguadalupe Opiela ?01/31/2022, 3:31 PM ? ?

## 2022-01-31 NOTE — Progress Notes (Signed)
Subjective: ?Daughte rhere today reports no further episodes.  ? ?Exam: ?Vitals:  ? 01/31/22 0515 01/31/22 0742  ?BP: (!) 179/75 (!) 177/89  ?Pulse: 68 (!) 105  ?Resp: 20 18  ?Temp: (!) 97.4 ?F (36.3 ?C) 98 ?F (36.7 ?C)  ?SpO2: 94% (!) 89%  ? ?Gen: In bed, NAD ?Resp: non-labored breathing, no acute distress ?Abd: soft, nt ? ?Neuro: ?MS: Awake, alert, speech is more fluent than yesterday ?CN: facial weakness is resolved, VFF ?Motor: Very mild RUE drift ?Sensory: Intact to light touch ? ? ?Impression: 81 year old female with recurrent episodes of aphasia, sometimes accompanied by right facial weakness or arm weakness.  Stuttering TIA was  considered a possibility, but given how long the symptoms have been stuttering with negative repeat MRI, as well as the fact that she appears to be amnestic to the episodes, I think that Dr. Iver Nestle was on the right track with possible seizure activity. EEG is still negative, but would favor increasing keppra. If she continues to have spells, we may need to consider an admission for spell characterization.  ? ?Recommendations: ?1) continue keppra 1g BID ?2) If any further spells, will need to consider LTM-EEG ?3) Neurology will be avaialble should any further spells occur, please call if there remain any questions.  ? ?Ritta Slot, MD ?[facility] ?[phone] ? ?If 7pm- 7am, please page neurology on call as listed in AMION. ? ?

## 2022-01-31 NOTE — TOC Progression Note (Addendum)
Transition of Care (TOC) - Progression Note  ? ? ?Patient Details  ?Name: JULITTA DLUGOS ?MRN: [id number] ?Date of Birth: Mar 01, 1941 ? ?Transition of Care (TOC) CM/SW Contact  ?Alberteen Sam, LCSW ?Phone Number: ?01/31/2022, 11:28 AM ? ?Clinical Narrative:    ? ?Magda Paganini at [facility] called this CSW to inquire as to why patient has no bed offers which has bee noted in patient's chart by previous RNCM. CSW updated Magda Paganini patient actually has 7 bed offers, unclear as to why this was noted.  ? ?Magda Paganini at [facility] able to make a bed offer, patient family informed.  ? ?Per MD patient is medically ready, Magda Paganini at [facility] [place] reports she can take tomorrow. MD updated. ? ?Expected Discharge Plan: [place] ?Barriers to Discharge: Continued Medical Work up ? ?Expected Discharge Plan and Services ?Expected Discharge Plan: [place] ?  ?  ?  ?Living arrangements for the past 2 months: [place] ?                ?  ?  ?  ?  ?  ?  ?  ?  ?  ?  ? ? ?Social Determinants of Health (SDOH) Interventions ?  ? ?Readmission Risk Interventions ? ?  01/28/2022  ?  2:28 PM  ?Readmission Risk Prevention Plan  ?Transportation Screening Complete  ?PCP or Specialist Appt within 3-5 Days Complete  ?[place] or Home Care Consult Complete  ?Social Work Consult for [place] Planning/Counseling Complete  ?Palliative Care Screening Complete  ?Medication Review Press photographer) Complete  ? ? ?

## 2022-01-31 NOTE — Care Management Important Message (Signed)
Important Message ? ?Patient Details  ?Name: Cheyenne Frank ?MRN: [id number] ?Date of Birth: 09/26/1940 ? ? ?Medicare Important Message Given:  Yes ? ? ? ? ?Juliann Pulse A Khylee Algeo ?01/31/2022, 2:51 PM ?

## 2022-01-31 NOTE — Progress Notes (Addendum)
? ? ? ? ?Progress Note  ? ? ?Cheyenne Frank  [id number] DOB: May 18, 1941  DOA: 01/24/2022 ?PCP: Cheyenne Frank, [facility]  ? ? ? ? ?Brief Narrative:  ? ? ?Medical records reviewed and are as summarized below: ? ?Cheyenne Frank is a 81 y.o. female with medical history significant for Paroxysmal A-fib on Eliquis, prior stroke, HTN, insulin-dependent type 2 diabetes, sleep apnea, CKD stage IIIb, COPD, diastolic CHF, who presented to the hospital with getting her words out.  Reportedly, BP was 232/102 when EMS picked her up. ? ? ? ?Assessment/Plan:  ? ?Principal Problem: ?  Aphasia ?Active Problems: ?  History of CVA (cerebrovascular accident) ?  Hypertensive urgency ?  Uncontrolled type 2 diabetes mellitus with hyperglycemia, with long-term current use of insulin (Cheyenne Frank) ?  Sleep apnea ?  Stage 3b chronic kidney disease (CKD) (Cheyenne Frank) ?  COPD (chronic obstructive pulmonary disease) (Cheyenne Frank) ?  Chronic diastolic CHF (congestive heart failure) (Cheyenne Frank) ?  Paroxysmal atrial fibrillation (HCC) ?  Seizure (Cheyenne Frank) ? ? ? ? ?Body mass index is 39.53 kg/m?.  (Obesity) ? ? ?Aphasia, suspected seizures, confusion: Continue Keppra.  She appears to be at her baseline.  No evidence of acute stroke on MRI brain.  Neurologist recommended long-term EEG monitoring if episodes of aphasia recurs.  Follow-up with neurologist. ? ?Hypertensive urgency, hypertension: Continue antihypertensives.  Change as needed hydralazine to scheduled hydralazine and monitor BP ? ?Suspected acute UTI: Start IV Rocephin and follow-up urine culture. ? ?Type II DM with severe hyperglycemia, hypoglycemia on 01/29/2022: Continue insulin glargine and NovoLog as needed. ?According to admission med rec, she takes Lantus 64 units nightly. ? ?Hypokalemia: Repeat BMP tomorrow ? ?Paroxysmal atrial fibrillation with history of stroke: Continue metoprolol and Eliquis ? ?Chronic diastolic CHF: Compensated.  Continue antihypertensives ? ?COPD: Stable.  Continue  bronchodilators ? ?Generalized weakness: PT and OT recommend discharge to SNF ? ?Other comorbidities include OSA, CKD stage IIIb ? ? ? ?Diet Order   ? ?       ?  DIET DYS 3 Room service appropriate? Yes with Assist; Fluid consistency: Thin  Diet effective now       ?  ? ?  ?  ? ?  ? ? ? ? ? ? ? ? ?Consultants: ?Neurologist ? ?Procedures: ?None ? ? ? ?Medications:  ? ?  stroke: early stages of recovery book   Does not apply Once  ? apixaban  5 mg Oral BID  ? aspirin EC  81 mg Oral Daily  ? carvedilol  25 mg Oral BID  ? hydrALAZINE  25 mg Oral Q8H  ? insulin aspart  0-20 Units Subcutaneous TID WC  ? insulin aspart  0-5 Units Subcutaneous QHS  ? insulin glargine-yfgn  35 Units Subcutaneous QHS  ? levETIRAcetam  1,000 mg Oral BID  ? losartan  25 mg Oral Daily  ? multivitamin with minerals  1 tablet Oral Daily  ? oxybutynin  10 mg Oral Daily  ? pantoprazole  40 mg Oral Daily  ? PARoxetine  40 mg Oral QHS  ? potassium chloride  40 mEq Oral Daily  ? rosuvastatin  2.5 mg Oral QHS  ? torsemide  20 mg Oral Daily  ? ?Continuous Infusions: ? ? ? ? ?Anti-infectives (From admission, onward)  ? ? None  ? ?  ? ? ? ? ? ? ? ? ? ?Family Communication/Anticipated D/C date and plan/Code Status  ? ?DVT prophylaxis:  ?apixaban (ELIQUIS) tablet 5 mg  ? ?  Code Status: Full Code ? ?Family Communication: Discussed plan of care with her daughter, Cheyenne Frank, at the bedside ?Disposition Plan: Plan to discharge to SNF ? ? ?Status is: Inpatient ?Remains inpatient appropriate because: Altered mental status, suspected seizures ? ? ? ? ? ? ?Subjective:  ? ?Interval events noted.  She has no complaints.  She is sitting up in the chair.  Cheyenne Frank, daughter, was at the bedside ? ? ?Objective:  ? ? ?Vitals:  ? 01/30/22 2016 01/31/22 0038 01/31/22 0515 01/31/22 [id number]  ?BP: (!) 156/64 (!) 157/62 (!) 179/75 (!) 177/89  ?Pulse: 65 68 68 (!) 105  ?Resp: 18 18 20 18   ?Temp: 98.8 ?F (37.1 ?C) (!) 97.5 ?F (36.4 ?C) (!) 97.4 ?F (36.3 ?C) 98 ?F (36.7 ?C)  ?TempSrc: Oral  Oral Oral   ?SpO2: 96% 92% 94% (!) 89%  ?Weight:      ? ?No data found. ? ? ?Intake/Output Summary (Last 24 hours) at 01/31/2022 1234 ?Last data filed at 01/31/2022 1027 ?Gross per 24 hour  ?Intake 240 ml  ?Output 400 ml  ?Net -160 ml  ? ?Filed Weights  ? 01/25/22 1300  ?Weight: 111.1 kg  ? ? ?Exam: ? ?GEN: NAD ?SKIN: No rash ?EYES: EOMI ?ENT: MMM ?CV: RRR ?PULM: CTA B ?ABD: soft, obese, NT, +BS ?CNS: AAO x 2 (person and place), non focal ?EXT: No edema or tenderness ? ? ? ? ?  ? ? ?Data Reviewed:  ? ?I have personally reviewed following labs and imaging studies: ? ?Labs: ?Labs show the following:  ? ?Basic Metabolic Panel: ?Recent Labs  ?Lab 01/24/22 ?1830 01/25/22 ?0423 01/27/22 ?[id number] 01/29/22 ?[id number] 01/31/22 ?0421  ?NA 139  --  141 144 143  ?K 3.4*  --  3.4* 3.3* 4.1  ?CL 100  --  104 105 103  ?CO2 29  --  32 32 31  ?GLUCOSE 390*  --  148* 70 205*  ?BUN 25*  --  18 20 22   ?CREATININE 1.30*  --  1.07* 1.11* 1.19*  ?CALCIUM 8.8*  --  8.7* 9.1 9.1  ?MG  --  2.1 2.1  --  2.1  ?PHOS  --   --  3.2  --   --   ? ?GFR ?Estimated Creatinine Clearance: 47.6 mL/min (A) (by C-G formula based on SCr of 1.19 mg/dL (H)). ?Liver Function Tests: ?Recent Labs  ?Lab 01/24/22 ?1830  ?AST 33  ?ALT 21  ?ALKPHOS 82  ?BILITOT 0.9  ?PROT 6.5  ?ALBUMIN 3.2*  ? ?No results for input(s): LIPASE, AMYLASE in the last 168 hours. ?No results for input(s): AMMONIA in the last 168 hours. ?Coagulation profile ?No results for input(s): INR, PROTIME in the last 168 hours. ? ?CBC: ?Recent Labs  ?Lab 01/24/22 ?1830  ?WBC 8.2  ?NEUTROABS 6.2  ?HGB 12.8  ?HCT 39.9  ?MCV 90.1  ?PLT 159  ? ?Cardiac Enzymes: ?No results for input(s): CKTOTAL, CKMB, CKMBINDEX, TROPONINI in the last 168 hours. ?BNP (last 3 results) ?No results for input(s): PROBNP in the last 8760 hours. ?CBG: ?Recent Labs  ?Lab 01/30/22 ?1124 01/30/22 ?1610 01/30/22 ?2129 01/31/22 ?[id number] 01/31/22 ?1138  ?GLUCAP 226* 298* 303* 188* 250*  ? ?D-Dimer: ?No results for input(s): DDIMER in the last 72  hours. ?Hgb A1c: ?No results for input(s): HGBA1C in the last 72 hours. ?Lipid Profile: ?No results for input(s): CHOL, HDL, LDLCALC, TRIG, CHOLHDL, LDLDIRECT in the last 72 hours. ? ?Thyroid function studies: ?No results for input(s): TSH, T4TOTAL, T3FREE, THYROIDAB in the  last 72 hours. ? ?Invalid input(s): FREET3 ?Anemia work up: ?No results for input(s): VITAMINB12, FOLATE, FERRITIN, TIBC, IRON, RETICCTPCT in the last 72 hours. ?Sepsis Labs: ?Recent Labs  ?Lab 01/24/22 ?1830  ?WBC 8.2  ? ? ?Microbiology ?No results found for this or any previous visit (from the past 240 hour(s)). ? ?Procedures and diagnostic studies: ? ?MR BRAIN WO CONTRAST ? ?Result Date: 01/30/2022 ?CLINICAL DATA:  Altered mental status and acute neurologic deficit EXAM: MRI HEAD WITHOUT CONTRAST TECHNIQUE: Multiplanar, multiecho pulse sequences of the brain and surrounding structures were obtained without intravenous contrast. COMPARISON:  01/24/2022 FINDINGS: Severely motion degraded examination. Brain: No acute infarct, mass effect or extra-axial collection. Small vessel infarct within the ventral left thalamus is unchanged. There is multifocal hyperintense T2-weighted signal within the white matter. Generalized cerebral volume loss. The midline structures are normal. Vascular: Major flow voids are preserved. Skull and upper cervical spine: Normal calvarium and skull base. Visualized upper cervical spine and soft tissues are normal. Sinuses/Orbits:No paranasal sinus fluid levels or advanced mucosal thickening. No mastoid or middle ear effusion. Normal orbits. IMPRESSION: 1. Severely motion degraded examination. 2. No acute intracranial abnormality. 3. Unchanged small vessel infarct within the ventral left thalamus. Electronically Signed   By: Ulyses Jarred M.D.   On: 01/30/2022 01:34  ? ?EEG adult ? ?Result Date: 01/29/2022 ?Greta Doom, MD     01/29/2022  4:01 PM History: 81 yo F with intermittent aphasia Sedation: None Technique:  This EEG was acquired with electrodes placed according to the International 10-20 electrode system (including Fp1, Fp2, F3, F4, C3, C4, P3, P4, O1, O2, T3, T4, T5, T6, A1, A2, Fz, Cz, Pz). The following electrodes we

## 2022-01-31 NOTE — Progress Notes (Signed)
Inpatient Diabetes Program Recommendations ? ?AACE/ADA: New Consensus Statement on Inpatient Glycemic Control  ? ?Target Ranges:  Prepandial:   less than 140 mg/dL ?     Peak postprandial:   less than 180 mg/dL (1-2 hours) ?     Critically ill patients:  140 - 180 mg/dL  ? ? Latest Reference Range & Units 01/30/22 07:21 01/30/22 11:24 01/30/22 16:10 01/30/22 21:29 01/31/22 07:42  ?Glucose-Capillary 70 - 99 mg/dL 628 (H) 315 (H) 176 (H) 303 (H) 188 (H)  ? ?Review of Glycemic Control ? ?Diabetes history: DM2 ?Outpatient Diabetes medications: Lantus 64 units QHS, Humalog 22 units TID with meals, Januvia 50 mg daily ?Current orders for Inpatient glycemic control: Semglee 35 units QHS, Novolog 0-20 units TID with meals, Novolog 0-5 units QHS ?  ?Inpatient Diabetes Program Recommendations:   ?  ?Insulin: Please consider ordering Novolog 5 units TID with meals for meal coverage if patient eats at least 50% of meals. ?  ?Thanks, ?Orlando Penner, RN, MSN, CDE ?Diabetes Coordinator ?Inpatient Diabetes Program ?[phone] (Team Pager from 8am to 5pm) ? ? ?

## 2022-02-01 DIAGNOSIS — G9341 Metabolic encephalopathy: Secondary | ICD-10-CM

## 2022-02-01 DIAGNOSIS — I480 Paroxysmal atrial fibrillation: Secondary | ICD-10-CM

## 2022-02-01 DIAGNOSIS — R569 Unspecified convulsions: Secondary | ICD-10-CM

## 2022-02-01 LAB — GLUCOSE, CAPILLARY
Glucose-Capillary: 147 mg/dL — ABNORMAL HIGH (ref 70–99)
Glucose-Capillary: 201 mg/dL — ABNORMAL HIGH (ref 70–99)
Glucose-Capillary: 138 mg/dL — ABNORMAL HIGH (ref 70–99)
Glucose-Capillary: 159 mg/dL — ABNORMAL HIGH (ref 70–99)

## 2022-02-01 MED ORDER — QUETIAPINE FUMARATE 25 MG PO TABS
25.0000 mg | ORAL_TABLET | Freq: Every day | ORAL | Status: DC
Start: 2022-02-01 — End: 2022-02-03
  Administered 2022-02-01 – 2022-02-02 (×2): 25 mg via ORAL
  Filled 2022-02-01 (×2): qty 1

## 2022-02-01 NOTE — Progress Notes (Signed)
  Progress Note   Patient: Cheyenne Frank [id number] DOB: May 16, 1941 DOA: 01/24/2022     7 DOS: the patient was seen and examined on 02/01/2022   Brief hospital course: Cheyenne Frank is a 81 y.o. female with medical history significant for Paroxysmal A-fib on Eliquis, prior stroke, HTN, insulin-dependent type 2 diabetes, sleep apnea, CKD stage IIIb, COPD, diastolic CHF, who presented to the hospital with getting her words out.  Reportedly, BP was 232/102 when EMS picked her up. Patient was seen by neurology, suspect patient had a seizure.  Started on Keppra. 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   Assessment and Plan: Acute metabolic encephalopathy Urinary tract infection. Urine culture still pending, patient still is more confused than normal.  Discussed with the patient daughter, patient currently not at baseline.  She could not sleep at night, her sleep cycle seem to be reversed.  We will start Seroquel at nighttime.  Another possibility that this is due to UTI.  Patient currently treated with Rocephin while pending urine culture results.  Aphasia, Suspected seizure disorder. Patient speech is better today.  Continue Keppra per recommendation from neurology.  Paroxysmal atrial fibrillation. History of stroke. Continue metoprolol and Eliquis.  Chronic diastolic congestive heart failure Hypertension urgency. Blood pressure is better.  Continue home medicines, currently patient does not have volume overload.  COPD  Obstructive sleep apnea. Continue home inhalers.  Discussed with patient daughter, patient has been diagnosed with OSA for many years, she refused to wear her CPAP.  Hypokalemia. Repleted.  Generalized weakness. Patient has accepted to nursing home.  Chronic kidney disease stage IIIb. Condition stable.  Morbid obesity. Patient has BMI of 39.53, with associated congestive heart failure.  Patient has morbid  obesity.      Subjective:  Patient still has some confusion, significant weakness. Denies any short of breath.  Physical Exam: Vitals:   01/31/22 2024 02/01/22 0102 02/01/22 0458 02/01/22 0749  BP: (!) 155/56 (!) 174/65 (!) 177/74 (!) 189/83  Pulse: 74 69 73 77  Resp: (!) 22 20 18 17   Temp:  97.9 F (36.6 C) 98 F (36.7 C) 99 F (37.2 C)  TempSrc:  Oral Oral   SpO2: 95% 96% 94% 90%  Weight:       General exam: Appears calm and comfortable, morbid obese. Respiratory system: Clear to auscultation. Respiratory effort normal. Cardiovascular system: S1 & S2 heard, RRR. No JVD, murmurs, rubs, gallops or clicks. No pedal edema. Gastrointestinal system: Abdomen is nondistended, soft and nontender. No organomegaly or masses felt. Normal bowel sounds heard. Central nervous system: Alert and oriented x2. No focal neurological deficits. Extremities: Symmetric 5 x 5 power. Skin: No rashes, lesions or ulcers  Data Reviewed:  Reviewed lab results, MRI of the brain, CT chest, EEG.  Family Communication: Daughter updated at bedside  Disposition: Status is: Inpatient Remains inpatient appropriate because: Severity of disease, altered mental status  Planned Discharge Destination: Skilled nursing facility    Time spent: 36 minutes  Author: Sharen Hones, MD 02/01/2022 12:37 PM  For on call review [email or web address].

## 2022-02-01 NOTE — TOC Progression Note (Signed)
Transition of Care [facility]) - Progression Note    Patient Details  Name: Cheyenne Frank MRN: [id number] Date of Birth: 24-Oct-1940  Transition of Care [facility]) CM/SW Contact  Allayne Butcher, RN Phone Number: 02/01/2022, 11:23 AM  Clinical Narrative:    MD would like to keep patient another night, daughter believes that the patient's mental status is worse.  Plan for discharge to [facility].    Expected Discharge Plan: Skilled Nursing Facility Barriers to Discharge: Continued Medical Work up  Expected Discharge Plan and Services Expected Discharge Plan: Skilled Nursing Facility       Living arrangements for the past 2 months: Single Family Home                                       Social Determinants of Health (SDOH) Interventions    Readmission Risk Interventions    01/28/2022    2:28 PM  Readmission Risk Prevention Plan  Transportation Screening Complete  PCP or Specialist Appt within 3-5 Days Complete  HRI or Home Care Consult Complete  Social Work Consult for Recovery Care Planning/Counseling Complete  Palliative Care Screening Complete  Medication Review [employer]) Complete

## 2022-02-02 ENCOUNTER — Inpatient Hospital Stay: Payer: Medicare Other

## 2022-02-02 DIAGNOSIS — N390 Urinary tract infection, site not specified: Secondary | ICD-10-CM

## 2022-02-02 DIAGNOSIS — F0390 Unspecified dementia without behavioral disturbance: Secondary | ICD-10-CM

## 2022-02-02 LAB — URINE CULTURE: Culture: >=100000 — AB

## 2022-02-02 LAB — GLUCOSE, CAPILLARY
Glucose-Capillary: 181 mg/dL — ABNORMAL HIGH (ref 70–99)
Glucose-Capillary: 199 mg/dL — ABNORMAL HIGH (ref 70–99)
Glucose-Capillary: 220 mg/dL — ABNORMAL HIGH (ref 70–99)
Glucose-Capillary: 143 mg/dL — ABNORMAL HIGH (ref 70–99)

## 2022-02-02 MED ORDER — INSULIN LISPRO 100 UNIT/ML IJ SOLN: 8.0000 [IU] | Freq: Three times a day (TID) | INTRAMUSCULAR | 0 refills | Status: DC

## 2022-02-02 MED ORDER — POTASSIUM CHLORIDE CRYS ER 20 MEQ PO TBCR: 20.0000 meq | EXTENDED_RELEASE_TABLET | Freq: Every day | ORAL | 0 refills | Status: DC

## 2022-02-02 MED ORDER — LEVETIRACETAM 1000 MG PO TABS: 1000.0000 mg | ORAL_TABLET | Freq: Two times a day (BID) | ORAL | 0 refills | Status: DC

## 2022-02-02 MED ORDER — FENTANYL CITRATE PF 50 MCG/ML IJ SOSY
12.5000 ug | PREFILLED_SYRINGE | Freq: Once | INTRAMUSCULAR | Status: AC
  Administered 2022-02-02: 12.5 ug via INTRAVENOUS
  Filled 2022-02-02: qty 1

## 2022-02-02 MED ORDER — CEPHALEXIN 500 MG PO CAPS: 500.0000 mg | ORAL_CAPSULE | Freq: Three times a day (TID) | ORAL | 0 refills | Status: DC

## 2022-02-02 MED ORDER — INSULIN GLARGINE 100 UNIT/ML ~~LOC~~ SOLN: 35.0000 [IU] | Freq: Every day | SUBCUTANEOUS | 0 refills | Status: DC

## 2022-02-02 NOTE — Discharge Summary (Signed)
Physician Discharge Summary   Patient: Cheyenne Frank MRN: [id number] DOB: 14-Sep-1941  Admit date:     01/24/2022  Discharge date: 02/02/22  Discharge Physician: Sharen Hones   PCP: [facility]   Recommendations at discharge:   Follow-up with PCP in 1 week. Follow-up with neurology in 2 weeks.  Discharge Diagnoses: Principal Problem:   Aphasia Active Problems:   History of CVA (cerebrovascular accident)   Hypertensive urgency   Uncontrolled type 2 diabetes mellitus with hyperglycemia, with long-term current use of insulin (HCC)   Sleep apnea   Stage 3b chronic kidney disease (CKD) (HCC)   COPD (chronic obstructive pulmonary disease) (HCC)   Chronic diastolic CHF (congestive heart failure) (HCC)   Paroxysmal atrial fibrillation (HCC)   Seizure (HCC)   Acute metabolic encephalopathy   E. coli UTI   Dementia without behavioral disturbance ([place])  Resolved Problems:   * No resolved hospital problems. *  Hospital Course: Cheyenne Frank is a 81 y.o. female with medical history significant for Paroxysmal A-fib on Eliquis, prior stroke, HTN, insulin-dependent type 2 diabetes, sleep apnea, CKD stage IIIb, COPD, diastolic CHF, who presented to the hospital with getting her words out.  Reportedly, BP was 232/102 when EMS picked her up. Patient was seen by neurology, suspect patient had a seizure.  Started on Keppra. 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  Assessment and Plan: Acute metabolic encephalopathy Probably dementia. E Coli Urinary tract infection. Urine culture came back with E. coli, patient initially treated with Rocephin, will continue Keflex for additional 3 days to complete 5-day course. Patient was not placed on trazodone at the time of admission, patient was on it before admission.  She did not sleep well, but she has a waxing and waning mental status. She was given lower dose Seroquel last night,  seems to slept better. She still has significant confusion, I suspect she has significant dementia. At this point, I will restart her home dose trazodone after discharge.  Aphasia, Suspected seizure disorder. Patient speech is better.  Continue Keppra per recommendation from neurology. EEG did not show any seizure activity.  No observed seizure in the hospital.  Paroxysmal atrial fibrillation. History of stroke. Continue metoprolol and Eliquis.  Chronic diastolic congestive heart failure Hypertension urgency. Resume home medicines.  COPD  Obstructive sleep apnea. Continue home inhalers.  Discussed with patient daughter, patient has been diagnosed with OSA for many years, she refused to wear her CPAP.    Hypokalemia. Repleted, potassium has normalized. I have added 20 mEQ of KCl daily to home medicine.  Generalized weakness. Patient has accepted to nursing home.  Chronic kidney disease stage IIIb. Condition stable.  Morbid obesity. Patient has BMI of 39.53, with associated congestive heart failure.  Patient has morbid obesity.  Uncontrolled type 2 diabetes with hyperglycemia on long-term insulin use. Due to concerns of hypoglycemia, patient insulin dose was decreased in the hospital.  We will continue current regimen, may increase back to home dose if glucose running high again.       Consultants: Neurology Procedures performed: None  Disposition: Skilled nursing facility Diet recommendation:  Discharge Diet Orders (From admission, onward)     Start     Ordered   02/02/22 0000  Diet - low sodium heart healthy        02/02/22 1231           Cardiac diet DISCHARGE MEDICATION: Allergies as of 02/02/2022  Reactions   Codeine Palpitations   Other Reaction: RACES HEART   Dilaudid [hydromorphone] Other (See Comments), Rash   Other Reaction: lightheaded, altered ms, nervous, Paranoia paranoid Other Reaction: lightheaded, altered ms, nervous, Paranoia    Demerol [meperidine] Other (See Comments), Rash   agitated Agitation   Pravastatin Other (See Comments)   Muscle aches   Amiodarone Other (See Comments)   Per family pt had twitching while taking and cardiologist took pt off    Lipitor [atorvastatin] Other (See Comments), Itching   unknown   Nifedipine Other (See Comments)   unknown   Statins Rash   Makes her crazy   Sulfa Antibiotics Rash   Vancomycin Rash   deafness        Medication List     TAKE these medications    acetaminophen 500 MG tablet Commonly known as: TYLENOL Take 500-1,000 mg by mouth every 6 (six) hours as needed for mild pain or fever.   albuterol 108 (90 Base) MCG/ACT inhaler Commonly known as: VENTOLIN HFA albuterol sulfate HFA 90 mcg/actuation aerosol inhaler  INHALE 2 PUFFS INTO LUNGS 4 TIMES DAILY   apixaban 5 MG Tabs tablet Commonly known as: Eliquis Take 1 tablet (5 mg total) by mouth 2 (two) times daily.   aspirin EC 81 MG tablet Take 1 tablet (81 mg total) by mouth daily. MAY RESUME ON 12/31/20   carvedilol 25 MG tablet Commonly known as: COREG Take 25 mg by mouth 2 (two) times daily.   cephALEXin 500 MG capsule Commonly known as: KEFLEX Take 1 capsule (500 mg total) by mouth 3 (three) times daily for 3 days.   gabapentin 300 MG capsule Commonly known as: NEURONTIN Take 300 mg by mouth 2 (two) times daily.   hydrALAZINE 25 MG tablet Commonly known as: APRESOLINE Take 1 tablet (25 mg total) by mouth 3 (three) times daily as needed (For systolic above 0000000).   insulin glargine 100 UNIT/ML injection Commonly known as: LANTUS Inject 0.35 mLs (35 Units total) into the skin at bedtime. What changed: how much to take   insulin lispro 100 UNIT/ML injection Commonly known as: HumaLOG Inject 0.08 mLs (8 Units total) into the skin 3 (three) times daily with meals. What changed: how much to take   levETIRAcetam 1000 MG tablet Commonly known as: KEPPRA Take 1 tablet (1,000 mg total) by  mouth 2 (two) times daily.   liver oil-zinc oxide 40 % ointment Commonly known as: DESITIN Apply 1 application topically as needed for irritation.   losartan 25 MG tablet Commonly known as: COZAAR Take 1 tablet (25 mg total) by mouth daily.   multivitamin with minerals Tabs tablet Take 1 tablet by mouth daily.   omeprazole 40 MG capsule Commonly known as: PRILOSEC Take 40 mg by mouth daily.   oxybutynin 10 MG 24 hr tablet Commonly known as: DITROPAN-XL Take 10 mg by mouth daily.   PARoxetine 40 MG tablet Commonly known as: PAXIL Take 40 mg by mouth at bedtime.   rosuvastatin 5 MG tablet Commonly known as: CRESTOR Take 2.5 mg by mouth at bedtime.   sitaGLIPtin 50 MG tablet Commonly known as: JANUVIA Take 50 mg by mouth daily.   torsemide 20 MG tablet Commonly known as: DEMADEX Take 1 tablet (20 mg total) by mouth daily. Please start from 11/01/2021   traZODone 50 MG tablet Commonly known as: DESYREL Take 50 mg by mouth at bedtime.        [place],  [facility] Follow up in 1 week(s).   Specialty: Family Medicine Contact information: [address] [place] [id number] [facility] Follow up in 2 week(s).   Contact information: [place] [phone]               Discharge Exam: Danley Danker Weights   01/25/22 1300  Weight: 111.1 kg   General exam: Appears calm and comfortable, morbid obese Respiratory system: Clear to auscultation. Respiratory effort normal. Cardiovascular system: S1 & S2 heard, RRR. No JVD, murmurs, rubs, gallops or clicks. No pedal edema. Gastrointestinal system: Abdomen is nondistended, soft and nontender. No organomegaly or masses felt. Normal bowel sounds heard. Central nervous system: Alert and oriented x2. No focal neurological deficits. Extremities: Symmetric 5 x 5  power. Skin: No rashes, lesions or ulcers    Condition at discharge: fair  The results of significant diagnostics from this hospitalization (including imaging, microbiology, ancillary and laboratory) are listed below for reference.   Imaging Studies: CT Head Wo Contrast  Result Date: 01/24/2022 CLINICAL DATA:  Stroke suspected neuro deficit EXAM: CT HEAD WITHOUT CONTRAST TECHNIQUE: Contiguous axial images were obtained from the base of the skull through the vertex without intravenous contrast. RADIATION DOSE REDUCTION: This exam was performed according to the departmental dose-optimization program which includes automated exposure control, adjustment of the mA and/or kV according to patient size and/or use of iterative reconstruction technique. COMPARISON:  01/13/2021 FINDINGS: Brain: No evidence of acute infarction, hemorrhage, cerebral edema, mass, mass effect, or midline shift. No hydrocephalus or extra-axial fluid collection. Redemonstrated left thalamic lacunar infarct. Periventricular white matter changes, likely the sequela of chronic small vessel ischemic disease. Cerebral volume loss is likely within normal limits for age. Vascular: No hyperdense vessel. Atherosclerotic calcifications in the intracranial carotid and vertebral arteries. Skull: Hyperostosis frontalis. Left frontal burr hole. Negative for fracture or focal lesion. Sinuses/Orbits: No acute finding. Other: The mastoid air cells are well aerated. IMPRESSION: No acute intracranial process. Electronically Signed   By: Merilyn Baba M.D.   On: 01/24/2022 18:53   MR ANGIO HEAD WO CONTRAST  Result Date: 01/24/2022 CLINICAL DATA:  Initial evaluation for neuro deficit, stroke suspected. EXAM: MRI HEAD WITHOUT CONTRAST MRA HEAD WITHOUT CONTRAST MRA NECK WITHOUT AND WITH CONTRAST TECHNIQUE: Multiplanar, multi-echo pulse sequences of the brain and surrounding structures were acquired without intravenous contrast. Angiographic images of the  Circle of Willis were acquired using MRA technique without intravenous contrast. Angiographic images of the neck were acquired using MRA technique without and with intravenous contrast. Carotid stenosis measurements (when applicable) are obtained utilizing NASCET criteria, using the distal internal carotid diameter as the denominator. CONTRAST:  None. COMPARISON:  Comparison made with prior CT from earlier the same day. FINDINGS: MRI HEAD FINDINGS Brain: Examination technically limited as the patient was unable to tolerate the full length of the study. Axial and coronal DWI sequences, with axial T2 and sagittal T1 weighted sequences only were performed. Additionally, the provided images are degraded by motion. Generalized age-related cerebral atrophy. Remote lacunar infarct present at the ventral left thalamus. No abnormal foci of restricted diffusion to suggest acute or subacute ischemia. Gray-white matter differentiation otherwise grossly maintained. No other visible areas of chronic cortical infarction. No obvious evidence for intracranial hemorrhage on this limited exam. No visible mass lesion, mass effect or midline shift. Ventricles normal size without hydrocephalus. No extra-axial fluid collection.  Vascular: Major intracranial vascular flow voids are grossly maintained at the skull base. Skull and upper cervical spine: Craniocervical junction grossly within normal limits. No obvious focal marrow replacing lesion. No scalp soft tissue abnormality. Sinuses/Orbits: Prior bilateral ocular lens replacement. Globes and orbital soft tissues demonstrate no definite acute abnormality. Mild scattered mucosal thickening about the ethmoidal air cells. Paranasal sinuses are otherwise largely clear. No significant mastoid effusion. Other: None. MRA HEAD FINDINGS Not performed as the patient was unable to tolerate this portion of the ordered study. MRA NECK FINDINGS Not performed as the patient was unable to tolerate this  portion of the ordered study. IMPRESSION: 1. Technically limited exam due to the patient's inability to tolerate the full length of the study and motion artifact. 2. No acute intracranial infarct. No other definite acute abnormality identified. 3. Age-related cerebral atrophy with remote left thalamic lacunar infarct. 4. Please note that the ordered MRAs of the head and neck were unable to be performed due to the patient's inability to tolerate the full length of the study. No charges should be applied to the patient. A repeat examination could be attempted at a later time as the patient is able to tolerate. Alternatively, correlation with dedicated CTA and/or carotid Doppler ultrasound could also be performed as warranted. Electronically Signed   By: Rise Mu M.D.   On: 01/24/2022 22:02   MR Angiogram Neck W or Wo Contrast  Result Date: 01/24/2022 CLINICAL DATA:  Initial evaluation for neuro deficit, stroke suspected. EXAM: MRI HEAD WITHOUT CONTRAST MRA HEAD WITHOUT CONTRAST MRA NECK WITHOUT AND WITH CONTRAST TECHNIQUE: Multiplanar, multi-echo pulse sequences of the brain and surrounding structures were acquired without intravenous contrast. Angiographic images of the Circle of Willis were acquired using MRA technique without intravenous contrast. Angiographic images of the neck were acquired using MRA technique without and with intravenous contrast. Carotid stenosis measurements (when applicable) are obtained utilizing NASCET criteria, using the distal internal carotid diameter as the denominator. CONTRAST:  None. COMPARISON:  Comparison made with prior CT from earlier the same day. FINDINGS: MRI HEAD FINDINGS Brain: Examination technically limited as the patient was unable to tolerate the full length of the study. Axial and coronal DWI sequences, with axial T2 and sagittal T1 weighted sequences only were performed. Additionally, the provided images are degraded by motion. Generalized age-related  cerebral atrophy. Remote lacunar infarct present at the ventral left thalamus. No abnormal foci of restricted diffusion to suggest acute or subacute ischemia. Gray-white matter differentiation otherwise grossly maintained. No other visible areas of chronic cortical infarction. No obvious evidence for intracranial hemorrhage on this limited exam. No visible mass lesion, mass effect or midline shift. Ventricles normal size without hydrocephalus. No extra-axial fluid collection. Vascular: Major intracranial vascular flow voids are grossly maintained at the skull base. Skull and upper cervical spine: Craniocervical junction grossly within normal limits. No obvious focal marrow replacing lesion. No scalp soft tissue abnormality. Sinuses/Orbits: Prior bilateral ocular lens replacement. Globes and orbital soft tissues demonstrate no definite acute abnormality. Mild scattered mucosal thickening about the ethmoidal air cells. Paranasal sinuses are otherwise largely clear. No significant mastoid effusion. Other: None. MRA HEAD FINDINGS Not performed as the patient was unable to tolerate this portion of the ordered study. MRA NECK FINDINGS Not performed as the patient was unable to tolerate this portion of the ordered study. IMPRESSION: 1. Technically limited exam due to the patient's inability to tolerate the full length of the study and motion artifact. 2. No acute intracranial infarct.  No other definite acute abnormality identified. 3. Age-related cerebral atrophy with remote left thalamic lacunar infarct. 4. Please note that the ordered MRAs of the head and neck were unable to be performed due to the patient's inability to tolerate the full length of the study. No charges should be applied to the patient. A repeat examination could be attempted at a later time as the patient is able to tolerate. Alternatively, correlation with dedicated CTA and/or carotid Doppler ultrasound could also be performed as warranted.  Electronically Signed   By: Jeannine Boga M.D.   On: 01/24/2022 22:02   MR BRAIN WO CONTRAST  Result Date: 01/30/2022 CLINICAL DATA:  Altered mental status and acute neurologic deficit EXAM: MRI HEAD WITHOUT CONTRAST TECHNIQUE: Multiplanar, multiecho pulse sequences of the brain and surrounding structures were obtained without intravenous contrast. COMPARISON:  01/24/2022 FINDINGS: Severely motion degraded examination. Brain: No acute infarct, mass effect or extra-axial collection. Small vessel infarct within the ventral left thalamus is unchanged. There is multifocal hyperintense T2-weighted signal within the white matter. Generalized cerebral volume loss. The midline structures are normal. Vascular: Major flow voids are preserved. Skull and upper cervical spine: Normal calvarium and skull base. Visualized upper cervical spine and soft tissues are normal. Sinuses/Orbits:No paranasal sinus fluid levels or advanced mucosal thickening. No mastoid or middle ear effusion. Normal orbits. IMPRESSION: 1. Severely motion degraded examination. 2. No acute intracranial abnormality. 3. Unchanged small vessel infarct within the ventral left thalamus. Electronically Signed   By: Ulyses Jarred M.D.   On: 01/30/2022 01:34   MR BRAIN WO CONTRAST  Result Date: 01/24/2022 CLINICAL DATA:  Initial evaluation for neuro deficit, stroke suspected. EXAM: MRI HEAD WITHOUT CONTRAST MRA HEAD WITHOUT CONTRAST MRA NECK WITHOUT AND WITH CONTRAST TECHNIQUE: Multiplanar, multi-echo pulse sequences of the brain and surrounding structures were acquired without intravenous contrast. Angiographic images of the Circle of Willis were acquired using MRA technique without intravenous contrast. Angiographic images of the neck were acquired using MRA technique without and with intravenous contrast. Carotid stenosis measurements (when applicable) are obtained utilizing NASCET criteria, using the distal internal carotid diameter as the  denominator. CONTRAST:  None. COMPARISON:  Comparison made with prior CT from earlier the same day. FINDINGS: MRI HEAD FINDINGS Brain: Examination technically limited as the patient was unable to tolerate the full length of the study. Axial and coronal DWI sequences, with axial T2 and sagittal T1 weighted sequences only were performed. Additionally, the provided images are degraded by motion. Generalized age-related cerebral atrophy. Remote lacunar infarct present at the ventral left thalamus. No abnormal foci of restricted diffusion to suggest acute or subacute ischemia. Gray-white matter differentiation otherwise grossly maintained. No other visible areas of chronic cortical infarction. No obvious evidence for intracranial hemorrhage on this limited exam. No visible mass lesion, mass effect or midline shift. Ventricles normal size without hydrocephalus. No extra-axial fluid collection. Vascular: Major intracranial vascular flow voids are grossly maintained at the skull base. Skull and upper cervical spine: Craniocervical junction grossly within normal limits. No obvious focal marrow replacing lesion. No scalp soft tissue abnormality. Sinuses/Orbits: Prior bilateral ocular lens replacement. Globes and orbital soft tissues demonstrate no definite acute abnormality. Mild scattered mucosal thickening about the ethmoidal air cells. Paranasal sinuses are otherwise largely clear. No significant mastoid effusion. Other: None. MRA HEAD FINDINGS Not performed as the patient was unable to tolerate this portion of the ordered study. MRA NECK FINDINGS Not performed as the patient was unable to tolerate this portion of the ordered  study. IMPRESSION: 1. Technically limited exam due to the patient's inability to tolerate the full length of the study and motion artifact. 2. No acute intracranial infarct. No other definite acute abnormality identified. 3. Age-related cerebral atrophy with remote left thalamic lacunar infarct. 4.  Please note that the ordered MRAs of the head and neck were unable to be performed due to the patient's inability to tolerate the full length of the study. No charges should be applied to the patient. A repeat examination could be attempted at a later time as the patient is able to tolerate. Alternatively, correlation with dedicated CTA and/or carotid Doppler ultrasound could also be performed as warranted. Electronically Signed   By: Rise MuBenjamin  McClintock M.D.   On: 01/24/2022 22:02   US Carotid Bilateral (at [facility] and [facility] only)  Result Date: 01/25/2022 CLINICAL DATA:  Initial evaluation for neuro deficit, stroke. EXAM: BILATERAL CAROTID DUPLEX ULTRASOUND TECHNIQUE: Wallace CullensGray scale imaging, color Doppler and duplex ultrasound were performed of bilateral carotid and vertebral arteries in the neck. COMPARISON:  Prior brain MRI from 01/24/2022. FINDINGS: Criteria: Quantification of carotid stenosis is based on velocity parameters that correlate the residual internal carotid diameter with NASCET-based stenosis levels, using the diameter of the distal internal carotid lumen as the denominator for stenosis measurement. The following velocity measurements were obtained: RIGHT ICA: 73/13 cm/sec CCA: 75/9 cm/sec SYSTOLIC ICA/CCA RATIO:  1.0 ECA: 66 cm/sec LEFT ICA: 99/21 cm/sec CCA: 81/10 cm/sec SYSTOLIC ICA/CCA RATIO:  1.2 ECA: 106 cm/sec RIGHT CAROTID ARTERY: Scattered intimal atherosclerotic irregularity within the visualized right CCA without hemodynamically significant stenosis. Mild heterogeneous atherosclerotic plaque present about the right carotid bulb extending into the proximal right ICA. No elevation in peak systolic velocity to suggest hemodynamically significant stenosis. Visualized right ICA patent distally without stenosis. RIGHT VERTEBRAL ARTERY:  Patent with antegrade flow. LEFT CAROTID ARTERY: Mild smooth intimal thickening within the visualized left CCA without stenosis. Mild for age atheromatous  irregularity about the left carotid bulb, extending into the proximal left ICA. No elevation in peak systolic velocity to suggest hemodynamically significant stenosis. Visualized left ICA widely patent distally without stenosis. LEFT VERTEBRAL ARTERY:  Patent with antegrade flow. IMPRESSION: 1. Mild atherosclerotic irregularity and plaque about the bilateral carotid bulbs, extending into the proximal internal carotid arteries, right greater than left, but with no evidence for hemodynamically significant stenosis. 2. Patent antegrade flow within both vertebral arteries. Electronically Signed   By: Rise MuBenjamin  McClintock M.D.   On: 01/25/2022 02:47   EEG adult  Result Date: 01/29/2022 Rejeana BrockKirkpatrick, McNeill P, MD     01/29/2022  4:01 PM History: 81 yo F with intermittent aphasia Sedation: None Technique: This EEG was acquired with electrodes placed according to the International 10-20 electrode system (including Fp1, Fp2, F3, F4, C3, C4, P3, P4, O1, O2, T3, T4, T5, T6, A1, A2, Fz, Cz, Pz). The following electrodes were missing or displaced: none. Background: The background consists of intermixed alpha and beta activities. There is a well defined posterior dominant rhythm of 9 Hz that attenuates with eye opening. Sleep is recorded with normal appearing structures. Photic stimulation: Physiologic driving is not performed EEG Abnormalities: none Clinical Interpretation: This normal EEG is recorded in the waking and sleep state. There was no seizure or seizure predisposition recorded on this study. Please note that lack of epileptiform activity on EEG does not preclude the possibility of epilepsy. Ritta SlotMcNeill Kirkpatrick, MD [facility] [phone] If 7pm- 7am, please page neurology on call as listed in AMION.  EEG adult  Result Date: 01/25/2022 Lora Havens, MD     01/25/2022  6:58 PM Patient Name: TRYNITI MOORER MRN: [id number] Epilepsy Attending: Lora Havens Referring Physician/Provider: Lorenza Chick, MD Date: 01/25/2022 Duration: 22.31 mins Patient history: 81 y.o. female with medical history significant for Paroxysmal A-fib on Eliquis, prior stroke presenting to the ED with difficulty getting her words out similar to her prior strokes. EEG to evaluate for seizure Level of alertness: Awake, asleep AEDs during EEG study: LEV Technical aspects: This EEG study was done with scalp electrodes positioned according to the 10-20 International system of electrode placement. Electrical activity was acquired at a sampling rate of 500Hz  and reviewed with a high frequency filter of 70Hz  and a low frequency filter of 1Hz . EEG data were recorded continuously and digitally stored. Description: The posterior dominant rhythm consists of 8-9 Hz activity of moderate voltage (25-35 uV) seen predominantly in posterior head regions, symmetric and reactive to eye opening and eye closing. Sleep was characterized by vertex waves, sleep spindles (12 to 14 Hz), maximal frontocentral region. EEG showed intermittent generalized 3 to 6 Hz theta-delta slowing.  Hyperventilation and photic stimulation were not performed.   ABNORMALITY - Intermittent slow, generalized IMPRESSION: This study is suggestive of mild diffuse encephalopathy, nonspecific etiology. No seizures or epileptiform discharges were seen throughout the recording. Priyanka Barbra Sarks   CT ANGIO HEAD NECK W WO CM (CODE STROKE)  Result Date: 01/29/2022 CLINICAL DATA:  Neuro deficit, acute, stroke suspected EXAM: CT ANGIOGRAPHY HEAD AND NECK TECHNIQUE: Multidetector CT imaging of the head and neck was performed using the standard protocol during bolus administration of intravenous contrast. Multiplanar CT image reconstructions and MIPs were obtained to evaluate the vascular anatomy. Carotid stenosis measurements (when applicable) are obtained utilizing NASCET criteria, using the distal internal carotid diameter as the denominator. RADIATION DOSE REDUCTION: This exam was  performed according to the departmental dose-optimization program which includes automated exposure control, adjustment of the mA and/or kV according to patient size and/or use of iterative reconstruction technique. CONTRAST:  37mL OMNIPAQUE IOHEXOL 350 MG/ML SOLN COMPARISON:  CT head 01/24/2022 FINDINGS: CT HEAD Brain: There is no acute intracranial hemorrhage, mass effect, or edema. No new loss of gray-white differentiation. Chronic left thalamic infarct. Stable findings of probable chronic microvascular ischemic changes in the cerebral white matter. There is no extra-axial fluid collection. Ventricles and sulci are stable in size and configuration. Vascular: No hyperdense vessel. Skull: Small left craniotomy. Sinuses/Orbits: No acute finding.  Patchy mucosal thickening. Other: None. Review of the MIP images confirms the above findings CTA NECK Streak artifact from dental amalgam. Aortic arch: Mild calcified plaque. Great vessel origins are patent. Right carotid system: Patent. No stenosis. Partially retropharyngeal course. Left carotid system: Patent.  No stenosis. Vertebral arteries: Patent and codominant.  No stenosis. Skeleton: Degenerative changes of the included spine. Degenerative changes of the temporomandibular joints. Other neck: Unremarkable. Upper chest: No apical lung mass. Review of the MIP images confirms the above findings CTA HEAD Anterior circulation: Intracranial internal carotid arteries are patent minimal calcified plaque. Anterior cerebral arteries are patent. Left A1 ACA is dominant. Anterior communicating artery is present. Middle cerebral arteries are patent. Posterior circulation: Intracranial vertebral arteries, basilar artery, and posterior cerebral arteries are patent. Venous sinuses: Patent as allowed by contrast bolus timing. Review of the MIP images confirms the above findings IMPRESSION: No acute intracranial abnormality. No evidence of evolving recent infarction. No large vessel  occlusion, hemodynamically significant stenosis,  or evidence of dissection. Electronically Signed   By: Macy Mis M.D.   On: 01/29/2022 13:00    Microbiology: Results for orders placed or performed during the hospital encounter of 01/24/22  Urine Culture     Status: Abnormal   Collection Time: 01/31/22  3:59 PM   Specimen: Urine, Clean Catch  Result Value Ref Range Status   Specimen Description   Final    URINE, CLEAN CATCH Performed at [facility], [address], [place], [place] [address]    Special Requests   Final    NONE Performed at [facility], [place], [place] [address]    Culture >=100,000 COLONIES/mL ESCHERICHIA COLI (A)  Final   Report Status 02/02/2022 FINAL  Final   Organism ID, Bacteria ESCHERICHIA COLI (A)  Final      Susceptibility   Escherichia coli - MIC*    AMPICILLIN 4 SENSITIVE Sensitive     CEFAZOLIN <=4 SENSITIVE Sensitive     CEFEPIME <=0.12 SENSITIVE Sensitive     CEFTRIAXONE <=0.25 SENSITIVE Sensitive     CIPROFLOXACIN <=0.25 SENSITIVE Sensitive     GENTAMICIN <=1 SENSITIVE Sensitive     IMIPENEM <=0.25 SENSITIVE Sensitive     NITROFURANTOIN <=16 SENSITIVE Sensitive     TRIMETH/SULFA <=20 SENSITIVE Sensitive     AMPICILLIN/SULBACTAM <=2 SENSITIVE Sensitive     PIP/TAZO <=4 SENSITIVE Sensitive     * >=100,000 COLONIES/mL ESCHERICHIA COLI    Labs: CBC: No results for input(s): WBC, NEUTROABS, HGB, HCT, MCV, PLT in the last 168 hours. Basic Metabolic Panel: Recent Labs  Lab 01/27/22 0752 01/29/22 0527 01/31/22 0421  NA 141 144 143  K 3.4* 3.3* 4.1  CL 104 105 103  CO2 32 32 31  GLUCOSE 148* 70 205*  BUN 18 20 22   CREATININE 1.07* 1.11* 1.19*  CALCIUM 8.7* 9.1 9.1  MG 2.1  --  2.1  PHOS 3.2  --   --    Liver Function Tests: No results for input(s): AST, ALT, ALKPHOS, BILITOT, PROT, ALBUMIN in the last 168 hours. CBG: Recent Labs  Lab 02/01/22 0731 02/01/22 1148 02/01/22 1637 02/01/22 2048  02/02/22 0757  GLUCAP 147* 201* 138* 159* 181*    Discharge time spent: greater than 30 minutes.  Signed: Sharen Hones, MD [facility] 02/02/2022

## 2022-02-02 NOTE — Consult Note (Signed)
[facility] TeleNeurology Consult Services  Stat Consult  Patient Name:   Cheyenne Frank, Cheyenne Frank Date of Birth:   Feb 08, 1941 Identification Number:   MRN - [id number] Date of Service:   02/02/2022 21:30:58  Diagnosis:       R47.01 - Aphasia  Impression 81 yo F with h/o chronic diastolic heart failure, CKD, prior R thalamic stroke 2015, episode of aphasia 2010, HTN, HLD, DM2, severe OSA (CPAP nonadherent) seizure disorder (unclear, seems per chart no actual seizures on EEG, eventually taken off Keppra), afib on Eliquis, prior L sided SDH, currently admitted since 01/24/22 with aphasia, with MRI x 2 unremarkable though motion-limited, vessel imaging unremarkable, routine EEG x 2 unremarkable, started on Keppra this admission with plan to transfer for continuous EEG. Teleneurology consulted tonight for worsened aphasia and new L sided headache. Recommend stat CT head without contrast and if unremarkable then ok for evening dose of Eliquis, otherwise agree with plan for transfer for continuous EEG.   Recommendations: Our recommendations are outlined below.  Diagnostic Studies : Stat CT head without contrast  Anticoagulant Medications : Continue Eliquis if CT head without bleed  Antiepileptics Medications : Continue Keppra 1 g BID  Nursing Recommendations : Neuro checks q4 hrs x 24 hrs and then per shiftHead of bed 30 degreesContinue with Telemetry  Consultations : Speech therapy  Disposition : Neurology will follow  ----------------------------------------------------------------------------------------------------    Metrics: [facility] Notification Time: 02/02/2022 21:28:25 Stamp Time: 02/02/2022 21:30:58 Callback Response Time: 02/02/2022 21:32:02     ----------------------------------------------------------------------------------------------------  Chief Complaint: aphasia, headache  History of Present Illness: Patient is a 81 year old  Female. 81 yo F with h/o chronic diastolic heart failure, CKD, prior R thalamic stroke 2015, episode of aphasia 2010, HTN, HLD, DM2, severe OSA (CPAP nonadherent) seizure disorder (unclear, seems per chart no actual seizures on EEG, eventually taken off Keppra), afib on Eliquis, prior L sided SDH, currently admitted since 01/24/22 with aphasia. She has difficulty speaking at baseline occasionally usually associated with blood sugar issues or stress, with overall progressive decline.  Workup for aphasia here has included CT head and two MRI scans of the brain (both unremarkable but motion-limited), could not cooperate for MRAs, carotid US unremarkable, CTA head/neck unremarkable, two routine EEGs that were unremarkable. She was restarted on Keppra 750 mg BID, then increased to 1000 mg BID. Plan is to transfer for continuous EEG.  Teleneurology consulted tonight for worsened aphasia and new L sided headache. Prior neurology notes mention she is holding the L side of her head though no clear mention of report of headache, unclear if this is because she did not have a prior headache or couldn't express it. Pt able to say tonight that she doesn't usually have headache with her aphasia. Otherwise her interview/exam for me is very limited, upset and repeats "I don't want it" when exam attempted.    Past Medical History:      Hypertension      Diabetes Mellitus      Hyperlipidemia      Atrial Fibrillation      Stroke Other PMH:  SDH, ?seizure disorder  Medications:  Anticoagulant use:  Yes Eliquis Antiplatelet use: Yes ASA 81 Reviewed EMR for current medications Other Medications Pertinent To Assessment Include: Keppra 1000 BID  Allergies:  Reviewed  Social History: Alcohol Use: No  Family History:  There is no family history of premature cerebrovascular disease pertinent to this consultation  ROS : 14 Points Review of Systems was performed and was negative  except mentioned in HPI.  Past  Surgical History: There Is No Surgical History Contributory To Today's Visit    Examination: BP(164/84), Pulse(61), 1A: Level of Consciousness - Alert; keenly responsive + 0 1B: Ask Month and Age - Could Not Answer Either Question Correctly + 2 1C: Blink Eyes & Squeeze Hands - Performs Both Tasks + 0 2: Test Horizontal Extraocular Movements - Normal + 0 3: Test Visual Fields - No Visual Loss + 0 4: Test Facial Palsy (Use Grimace if Obtunded) - Normal symmetry + 0 5A: Test Left Arm Motor Drift - No Drift for 10 Seconds + 0 5B: Test Right Arm Motor Drift - No Drift for 10 Seconds + 0 6A: Test Left Leg Motor Drift - No Effort Against Gravity + 3 6B: Test Right Leg Motor Drift - No Effort Against Gravity + 3 7: Test Limb Ataxia (FNF/Heel-Shin) - Does Not Understand + 0 8: Test Sensation - Normal; No sensory loss + 0 9: Test Language/Aphasia - Mild-Moderate Aphasia: Some Obvious Changes, Without Significant Limitation + 1 10: Test Dysarthria - Mild-Moderate Dysarthria: Slurring but can be understood + 1 11: Test Extinction/Inattention - No abnormality + 0  NIHSS Score: 10 NIHSS Free Text : About half of speech is unintelligible Some word finding difficulty and also mild dysarthria Does not want to cooperate with exam, repeats "I don't want it I don't want it" - speech is more clear as she gets upset     Patient / Family was informed the Neurology Consult would occur via TeleHealth consult by way of interactive audio and video telecommunications and consented to receiving care in this manner.  Patient is being evaluated for possible acute neurologic impairment and high probability of imminent or life - threatening deterioration.I spent total of 35 minutes providing care to this patient, including time for face to face visit via telemedicine, review of medical records, imaging studies and discussion of findings with providers, the patient and / or family.   Dr Raymond Gurney   [facility] [phone]  Case [id number]

## 2022-02-02 NOTE — TOC Transition Note (Signed)
Transition of Care [facility]) - CM/SW Discharge Note   Patient Details  Name: Cheyenne Frank MRN: [id number] Date of Birth: [birth year]  [facility]) CM/SW Contact:  Allayne Butcher, RN Phone Number: 02/02/2022, 12:52 PM   Clinical Narrative:    Patient will discharge to [facility] today, going to room 402, bedside RN will call report to [phone].  RNCM will set up EMS transport for 1330.  Patient's daughter aware of discharge today.   Final next level of care: Skilled Nursing Facility Barriers to Discharge: Barriers Resolved   Patient Goals and CMS Choice Patient states their goals for this hospitalization and ongoing recovery are:: SNF CMS Medicare.gov Compare Post Acute Care list provided to:: Patient Represenative (must comment) Choice offered to / list presented to : Adult Children  Discharge Placement              Patient [facility] bed at: [facility] Patient to be transferred to facility by: [facility] Name of family member notified: [name] Patient and family notified of of transfer: 02/02/22  Discharge Plan and Services                                     Social Determinants of Health (SDOH) Interventions     Readmission Risk Interventions    01/28/2022    2:28 PM  Readmission Risk Prevention Plan  Transportation Screening Complete  PCP or Specialist Appt within 3-5 Days Complete  HRI or Home Care Consult Complete  Social Work Consult for Recovery Care Planning/Counseling Complete  Palliative Care Screening Complete  Medication Review [facility]) Complete

## 2022-02-02 NOTE — Progress Notes (Addendum)
Due to waxing waning of mental status, I asked neurology to reevaluate her again.  Dr. Amada Jupiter opinion is that this most likely is dementia with waxing waning mental status.  But we need to rule out intermittent seizure.  He will talk to neurology to set up continuous EEG monitoring.  I will transfer patient to [facility].  If seizure is ruled out, condition most likely is due to dementia.  Patient has a bed in nursing home set up. Daughter updated about this decision.

## 2022-02-02 NOTE — Progress Notes (Signed)
     Subjective: Nurse reported right leg weaknesds and aphasia new from prior   Objective: Vitals:   02/02/22 1331 02/02/22 1633  BP: (!) 157/69 (!) 164/84  Pulse: 72 61  Resp: 20 18  Temp: 98.5 F (36.9 C) 98.5 F (36.9 C)  SpO2: 95% 91%     Assessment  NIH assessment positive only for garbled speech and aphasia New onset headache frontal headache 10/10 but patint stated was not new   Plan:   -stat teleneuro consult -stat CT head without contrast recommended by neurologist  And ordered  - multiple medication allergies 12.5 mcg fentanyl ordered for her pain -eliquis on hold until CT results  UPDATE CT without acute findings. Headache improved and patient rested

## 2022-02-02 NOTE — Progress Notes (Signed)
Subjective: Called by Dr. Gretel Acre due to increasing confusion.  She denies any difficulty speaking, though she does appear to me to be having significantly more trouble than she did a couple of days ago.  Exam: Vitals:   02/02/22 0756 02/02/22 1331  BP: (!) 146/69 (!) 157/69  Pulse: 61 72  Resp: 12 20  Temp: (!) 97.5 F (36.4 C) 98.5 F (36.9 C)  SpO2: 98% 95%   Gen: In bed, NAD Resp: non-labored breathing, no acute distress Abd: soft, nt  Neuro: MS: Awake, alert, she is unable to name even simple objects, but she is able to answer yes/no questions. CN: Pupils are equal round and reactive, she endorses seeing fingers wiggle in both hemifield's, face is symmetric Motor: She has mild right upper extremity drift, no drift on the left Sensory: She endorses symmetric sensation in all four extremities  Impression: 81 year old female who has been having intermittent episodes of difficulty speaking.  Throughout this hospitalization, there has been concern for seizures.  I suspect that she has two things going on, intermittent seizures as well as a waxing/waning delirium.  Unfortunately, I am unable to exclude clinically intermittent partial nonconvulsive seizures, and therefore I do think that she is going to need transfer for continuous EEG monitoring.  Recommendations: 1) continue Keppra 1 g twice daily 2) would favor transfer for continuous EEG.  Ritta Slot, MD [facility] [phone]  If 7pm- 7am, please page neurology on call as listed in AMION.

## 2022-02-03 ENCOUNTER — Inpatient Hospital Stay (HOSPITAL_COMMUNITY): Payer: Medicare Other

## 2022-02-03 ENCOUNTER — Inpatient Hospital Stay (HOSPITAL_COMMUNITY)
Admission: AD | Admit: 2022-02-03 | Discharge: 2022-02-07 | DRG: 100 | Disposition: A | Discharge status: Skilled Nursing Facility | Payer: Medicare Other | Source: Other Acute Inpatient Hospital | Attending: Family Medicine | Admitting: Family Medicine

## 2022-02-03 DIAGNOSIS — F32A Depression, unspecified: Secondary | ICD-10-CM | POA: Diagnosis present

## 2022-02-03 DIAGNOSIS — Z794 Long term (current) use of insulin: Secondary | ICD-10-CM | POA: Diagnosis not present

## 2022-02-03 DIAGNOSIS — Z91199 Patient's noncompliance with other medical treatment and regimen due to unspecified reason: Secondary | ICD-10-CM | POA: Diagnosis not present

## 2022-02-03 DIAGNOSIS — Z7984 Long term (current) use of oral hypoglycemic drugs: Secondary | ICD-10-CM

## 2022-02-03 DIAGNOSIS — Z882 Allergy status to sulfonamides status: Secondary | ICD-10-CM

## 2022-02-03 DIAGNOSIS — Z7901 Long term (current) use of anticoagulants: Secondary | ICD-10-CM

## 2022-02-03 DIAGNOSIS — Z8673 Personal history of transient ischemic attack (TIA), and cerebral infarction without residual deficits: Secondary | ICD-10-CM

## 2022-02-03 DIAGNOSIS — J449 Chronic obstructive pulmonary disease, unspecified: Secondary | ICD-10-CM | POA: Diagnosis not present

## 2022-02-03 DIAGNOSIS — I5033 Acute on chronic diastolic (congestive) heart failure: Secondary | ICD-10-CM | POA: Diagnosis present

## 2022-02-03 DIAGNOSIS — Z888 Allergy status to other drugs, medicaments and biological substances status: Secondary | ICD-10-CM | POA: Diagnosis not present

## 2022-02-03 DIAGNOSIS — E1122 Type 2 diabetes mellitus with diabetic chronic kidney disease: Secondary | ICD-10-CM | POA: Diagnosis present

## 2022-02-03 DIAGNOSIS — I10 Essential (primary) hypertension: Secondary | ICD-10-CM | POA: Diagnosis not present

## 2022-02-03 DIAGNOSIS — N390 Urinary tract infection, site not specified: Secondary | ICD-10-CM | POA: Diagnosis present

## 2022-02-03 DIAGNOSIS — Z885 Allergy status to narcotic agent status: Secondary | ICD-10-CM | POA: Diagnosis not present

## 2022-02-03 DIAGNOSIS — Z825 Family history of asthma and other chronic lower respiratory diseases: Secondary | ICD-10-CM

## 2022-02-03 DIAGNOSIS — G40909 Epilepsy, unspecified, not intractable, without status epilepticus: Principal | ICD-10-CM

## 2022-02-03 DIAGNOSIS — I5032 Chronic diastolic (congestive) heart failure: Secondary | ICD-10-CM | POA: Diagnosis not present

## 2022-02-03 DIAGNOSIS — H3530 Unspecified macular degeneration: Secondary | ICD-10-CM | POA: Diagnosis present

## 2022-02-03 DIAGNOSIS — I130 Hypertensive heart and chronic kidney disease with heart failure and stage 1 through stage 4 chronic kidney disease, or unspecified chronic kidney disease: Secondary | ICD-10-CM | POA: Diagnosis present

## 2022-02-03 DIAGNOSIS — K219 Gastro-esophageal reflux disease without esophagitis: Secondary | ICD-10-CM | POA: Diagnosis present

## 2022-02-03 DIAGNOSIS — R4701 Aphasia: Secondary | ICD-10-CM | POA: Diagnosis not present

## 2022-02-03 DIAGNOSIS — G928 Other toxic encephalopathy: Secondary | ICD-10-CM | POA: Diagnosis present

## 2022-02-03 DIAGNOSIS — Z6841 Body Mass Index (BMI) 40.0 and over, adult: Secondary | ICD-10-CM | POA: Diagnosis not present

## 2022-02-03 DIAGNOSIS — G894 Chronic pain syndrome: Secondary | ICD-10-CM | POA: Diagnosis present

## 2022-02-03 DIAGNOSIS — M797 Fibromyalgia: Secondary | ICD-10-CM | POA: Diagnosis present

## 2022-02-03 DIAGNOSIS — B9620 Unspecified Escherichia coli [E. coli] as the cause of diseases classified elsewhere: Secondary | ICD-10-CM | POA: Diagnosis present

## 2022-02-03 DIAGNOSIS — E1165 Type 2 diabetes mellitus with hyperglycemia: Secondary | ICD-10-CM | POA: Diagnosis not present

## 2022-02-03 DIAGNOSIS — G9341 Metabolic encephalopathy: Secondary | ICD-10-CM | POA: Diagnosis not present

## 2022-02-03 DIAGNOSIS — E11649 Type 2 diabetes mellitus with hypoglycemia without coma: Secondary | ICD-10-CM | POA: Diagnosis present

## 2022-02-03 DIAGNOSIS — Z881 Allergy status to other antibiotic agents status: Secondary | ICD-10-CM

## 2022-02-03 DIAGNOSIS — I480 Paroxysmal atrial fibrillation: Secondary | ICD-10-CM | POA: Diagnosis not present

## 2022-02-03 DIAGNOSIS — G4730 Sleep apnea, unspecified: Secondary | ICD-10-CM

## 2022-02-03 DIAGNOSIS — I69320 Aphasia following cerebral infarction: Secondary | ICD-10-CM

## 2022-02-03 DIAGNOSIS — Z96652 Presence of left artificial knee joint: Secondary | ICD-10-CM | POA: Diagnosis present

## 2022-02-03 DIAGNOSIS — Z7982 Long term (current) use of aspirin: Secondary | ICD-10-CM

## 2022-02-03 DIAGNOSIS — N1832 Chronic kidney disease, stage 3b: Secondary | ICD-10-CM | POA: Diagnosis present

## 2022-02-03 DIAGNOSIS — Z79899 Other long term (current) drug therapy: Secondary | ICD-10-CM

## 2022-02-03 DIAGNOSIS — G4733 Obstructive sleep apnea (adult) (pediatric): Secondary | ICD-10-CM | POA: Diagnosis present

## 2022-02-03 DIAGNOSIS — M961 Postlaminectomy syndrome, not elsewhere classified: Secondary | ICD-10-CM | POA: Diagnosis present

## 2022-02-03 LAB — COMPREHENSIVE METABOLIC PANEL
ALT: 41 U/L (ref 0–44)
AST: 37 U/L (ref 15–41)
Albumin: 2.7 g/dL — ABNORMAL LOW (ref 3.5–5.0)
Alkaline Phosphatase: 104 U/L (ref 38–126)
Anion gap: 9 (ref 5–15)
BUN: 25 mg/dL — ABNORMAL HIGH (ref 8–23)
CO2: 31 mmol/L (ref 22–32)
Calcium: 9 mg/dL (ref 8.9–10.3)
Chloride: 97 mmol/L — ABNORMAL LOW (ref 98–111)
Creatinine, Ser: 1.34 mg/dL — ABNORMAL HIGH (ref 0.44–1.00)
GFR, Estimated: 40 mL/min — ABNORMAL LOW (ref >60)
Glucose, Bld: 178 mg/dL — ABNORMAL HIGH (ref 70–99)
Potassium: 3.9 mmol/L (ref 3.5–5.1)
Sodium: 137 mmol/L (ref 135–145)
Total Bilirubin: 0.7 mg/dL (ref 0.3–1.2)
Total Protein: 6.2 g/dL — ABNORMAL LOW (ref 6.5–8.1)

## 2022-02-03 LAB — CBC WITH DIFFERENTIAL/PLATELET
Abs Immature Granulocytes: 0.03 10*3/uL (ref 0.00–0.07)
Basophils Absolute: 0 10*3/uL (ref 0.0–0.1)
Basophils Relative: 0 %
Eosinophils Absolute: 0.2 10*3/uL (ref 0.0–0.5)
Eosinophils Relative: 4 %
HCT: 41.8 % (ref 36.0–46.0)
Hemoglobin: 13.8 g/dL (ref 12.0–15.0)
Immature Granulocytes: 0 %
Lymphocytes Relative: 15 %
Lymphs Abs: 1.1 10*3/uL (ref 0.7–4.0)
MCH: 29.7 pg (ref 26.0–34.0)
MCHC: 33 g/dL (ref 30.0–36.0)
MCV: 90.1 fL (ref 80.0–100.0)
Monocytes Absolute: 0.9 10*3/uL (ref 0.1–1.0)
Monocytes Relative: 13 %
Neutro Abs: 4.7 10*3/uL (ref 1.7–7.7)
Neutrophils Relative %: 68 %
Platelets: 193 10*3/uL (ref 150–400)
RBC: 4.64 MIL/uL (ref 3.87–5.11)
RDW: 14.1 % (ref 11.5–15.5)
WBC: 6.9 10*3/uL (ref 4.0–10.5)
nRBC: 0 % (ref 0.0–0.2)

## 2022-02-03 LAB — GLUCOSE, CAPILLARY
Glucose-Capillary: 174 mg/dL — ABNORMAL HIGH (ref 70–99)
Glucose-Capillary: 223 mg/dL — ABNORMAL HIGH (ref 70–99)
Glucose-Capillary: 276 mg/dL — ABNORMAL HIGH (ref 70–99)
Glucose-Capillary: 162 mg/dL — ABNORMAL HIGH (ref 70–99)

## 2022-02-03 LAB — MAGNESIUM: Magnesium: 2.1 mg/dL (ref 1.7–2.4)

## 2022-02-03 MED ORDER — ONDANSETRON HCL 4 MG PO TABS: 4.0000 mg | ORAL_TABLET | Freq: Four times a day (QID) | ORAL | Status: DC | PRN

## 2022-02-03 MED ORDER — CARVEDILOL 12.5 MG PO TABS
25.0000 mg | ORAL_TABLET | Freq: Two times a day (BID) | ORAL | Status: DC
  Administered 2022-02-03 – 2022-02-07 (×7): 25 mg via ORAL
  Filled 2022-02-03 (×8): qty 2

## 2022-02-03 MED ORDER — CEPHALEXIN 500 MG PO CAPS
500.0000 mg | ORAL_CAPSULE | Freq: Three times a day (TID) | ORAL | Status: AC
  Administered 2022-02-03 – 2022-02-04 (×4): 500 mg via ORAL
  Filled 2022-02-03 (×5): qty 1

## 2022-02-03 MED ORDER — ADULT MULTIVITAMIN W/MINERALS CH
1.0000 | ORAL_TABLET | Freq: Every day | ORAL | Status: DC
  Administered 2022-02-04 – 2022-02-07 (×4): 1 via ORAL
  Filled 2022-02-03 (×4): qty 1

## 2022-02-03 MED ORDER — LEVETIRACETAM 500 MG PO TABS
1000.0000 mg | ORAL_TABLET | Freq: Two times a day (BID) | ORAL | Status: DC
  Administered 2022-02-03 – 2022-02-07 (×7): 1000 mg via ORAL
  Filled 2022-02-03 (×8): qty 2

## 2022-02-03 MED ORDER — PAROXETINE HCL 20 MG PO TABS
40.0000 mg | ORAL_TABLET | Freq: Every day | ORAL | Status: DC
Start: 2022-02-03 — End: 2022-02-07
  Administered 2022-02-03 – 2022-02-06 (×3): 40 mg via ORAL
  Filled 2022-02-03 (×4): qty 2

## 2022-02-03 MED ORDER — TRAZODONE HCL 50 MG PO TABS
50.0000 mg | ORAL_TABLET | Freq: Every day | ORAL | Status: DC
  Administered 2022-02-03 – 2022-02-06 (×3): 50 mg via ORAL
  Filled 2022-02-03 (×4): qty 1

## 2022-02-03 MED ORDER — INSULIN GLARGINE-YFGN 100 UNIT/ML ~~LOC~~ SOLN
35.0000 [IU] | Freq: Every day | SUBCUTANEOUS | Status: DC
  Administered 2022-02-03 – 2022-02-06 (×2): 35 [IU] via SUBCUTANEOUS
  Filled 2022-02-03 (×5): qty 0.35

## 2022-02-03 MED ORDER — POLYETHYLENE GLYCOL 3350 17 G PO PACK: 17.0000 g | PACK | Freq: Every day | ORAL | Status: DC | PRN

## 2022-02-03 MED ORDER — ONDANSETRON HCL 4 MG/2ML IJ SOLN: 4.0000 mg | Freq: Four times a day (QID) | INTRAMUSCULAR | Status: DC | PRN

## 2022-02-03 MED ORDER — SODIUM CHLORIDE 0.9% FLUSH
3.0000 mL | Freq: Two times a day (BID) | INTRAVENOUS | Status: DC
  Administered 2022-02-03 – 2022-02-07 (×7): 3 mL via INTRAVENOUS

## 2022-02-03 MED ORDER — INSULIN ASPART 100 UNIT/ML IJ SOLN
8.0000 [IU] | Freq: Three times a day (TID) | INTRAMUSCULAR | Status: DC
Start: 2022-02-04 — End: 2022-02-07
  Administered 2022-02-04 – 2022-02-06 (×6): 8 [IU] via SUBCUTANEOUS

## 2022-02-03 MED ORDER — OXYBUTYNIN CHLORIDE ER 5 MG PO TB24
10.0000 mg | ORAL_TABLET | Freq: Every day | ORAL | Status: DC
  Administered 2022-02-04 – 2022-02-07 (×4): 10 mg via ORAL
  Filled 2022-02-03 (×4): qty 2

## 2022-02-03 MED ORDER — ALBUTEROL SULFATE (2.5 MG/3ML) 0.083% IN NEBU: 2.5000 mg | INHALATION_SOLUTION | RESPIRATORY_TRACT | Status: DC | PRN

## 2022-02-03 MED ORDER — LOSARTAN POTASSIUM 25 MG PO TABS
25.0000 mg | ORAL_TABLET | Freq: Every day | ORAL | Status: DC
  Administered 2022-02-04 – 2022-02-06 (×3): 25 mg via ORAL
  Filled 2022-02-03 (×3): qty 1

## 2022-02-03 MED ORDER — HYDRALAZINE HCL 25 MG PO TABS: 25.0000 mg | ORAL_TABLET | Freq: Three times a day (TID) | ORAL | Status: DC | PRN

## 2022-02-03 MED ORDER — ASPIRIN 81 MG PO TBEC
81.0000 mg | DELAYED_RELEASE_TABLET | Freq: Every day | ORAL | Status: DC
Start: 2022-02-04 — End: 2022-02-07
  Administered 2022-02-04 – 2022-02-07 (×4): 81 mg via ORAL
  Filled 2022-02-03 (×4): qty 1

## 2022-02-03 MED ORDER — HYDRALAZINE HCL 20 MG/ML IJ SOLN
10.0000 mg | Freq: Four times a day (QID) | INTRAMUSCULAR | Status: DC | PRN
Start: 2022-02-03 — End: 2022-02-07

## 2022-02-03 MED ORDER — CEFADROXIL 500 MG PO CAPS
500.0000 mg | ORAL_CAPSULE | Freq: Two times a day (BID) | ORAL | Status: DC
  Administered 2022-02-03: 500 mg via ORAL
  Filled 2022-02-03 (×2): qty 1

## 2022-02-03 MED ORDER — GABAPENTIN 300 MG PO CAPS
300.0000 mg | ORAL_CAPSULE | Freq: Two times a day (BID) | ORAL | Status: DC
Start: 2022-02-03 — End: 2022-02-07
  Administered 2022-02-03 – 2022-02-07 (×7): 300 mg via ORAL
  Filled 2022-02-03 (×8): qty 1

## 2022-02-03 MED ORDER — LORAZEPAM 2 MG/ML IJ SOLN
0.5000 mg | Freq: Once | INTRAMUSCULAR | Status: AC
  Administered 2022-02-03: 0.5 mg via INTRAVENOUS
  Filled 2022-02-03: qty 1

## 2022-02-03 MED ORDER — APIXABAN 5 MG PO TABS
5.0000 mg | ORAL_TABLET | Freq: Two times a day (BID) | ORAL | Status: DC
  Administered 2022-02-03 – 2022-02-07 (×7): 5 mg via ORAL
  Filled 2022-02-03 (×8): qty 1

## 2022-02-03 MED ORDER — ACETAMINOPHEN 325 MG PO TABS
650.0000 mg | ORAL_TABLET | Freq: Four times a day (QID) | ORAL | Status: DC | PRN
  Administered 2022-02-04: 650 mg via ORAL
  Filled 2022-02-03 (×2): qty 2

## 2022-02-03 MED ORDER — TORSEMIDE 20 MG PO TABS
20.0000 mg | ORAL_TABLET | Freq: Every day | ORAL | Status: DC
  Administered 2022-02-04: 20 mg via ORAL
  Filled 2022-02-03: qty 1

## 2022-02-03 MED ORDER — ACETAMINOPHEN 650 MG RE SUPP: 650.0000 mg | Freq: Four times a day (QID) | RECTAL | Status: DC | PRN

## 2022-02-03 MED ORDER — PANTOPRAZOLE SODIUM 40 MG PO TBEC
40.0000 mg | DELAYED_RELEASE_TABLET | Freq: Every day | ORAL | Status: DC
  Administered 2022-02-04 – 2022-02-07 (×4): 40 mg via ORAL
  Filled 2022-02-03 (×4): qty 1

## 2022-02-03 MED ORDER — INSULIN ASPART 100 UNIT/ML IJ SOLN
0.0000 [IU] | Freq: Three times a day (TID) | INTRAMUSCULAR | Status: DC
  Administered 2022-02-03 – 2022-02-05 (×4): 3 [IU] via SUBCUTANEOUS
  Administered 2022-02-05 (×2): 5 [IU] via SUBCUTANEOUS
  Administered 2022-02-06: 3 [IU] via SUBCUTANEOUS
  Administered 2022-02-07: 2 [IU] via SUBCUTANEOUS

## 2022-02-03 MED ORDER — ROSUVASTATIN CALCIUM 5 MG PO TABS
2.5000 mg | ORAL_TABLET | Freq: Every day | ORAL | Status: DC
Start: 2022-02-03 — End: 2022-02-07
  Administered 2022-02-03 – 2022-02-06 (×3): 2.5 mg via ORAL
  Filled 2022-02-03 (×4): qty 1

## 2022-02-03 MED ORDER — ALBUTEROL SULFATE HFA 108 (90 BASE) MCG/ACT IN AERS: 2.0000 | INHALATION_SPRAY | RESPIRATORY_TRACT | Status: DC | PRN

## 2022-02-03 NOTE — Assessment & Plan Note (Addendum)
CPAP.  

## 2022-02-03 NOTE — Progress Notes (Signed)
Subjective: She again had an episode this morning of not talking, she is talking more when I come in and daughter seems surprised by this.  Exam: Vitals:   02/03/22 0510 02/03/22 0820  BP: (!) 171/63 (!) 172/66  Pulse: 73 72  Resp: 16 16  Temp: 98.2 F (36.8 C) (!) 97.5 F (36.4 C)  SpO2: 92% 91%   Gen: In bed, NAD Resp: non-labored breathing, no acute distress Abd: soft, nt  Neuro: MS: Awake, alert, she is able to say some simple phrases, but is limited in speech. CN: Pupils are equal round and reactive,  face is symmetric Motor: She has mild right upper extremity drift, no drift on the left Sensory: She endorses symmetric sensation in all four extremities  Impression: 81 year old female who has been having intermittent episodes of difficulty speaking.  Throughout this hospitalization, there has been concern for seizures.  I suspect that she has two things going on, intermittent seizures as well as a waxing/waning delirium.  Unfortunately, I am unable to exclude subclinical intermittent partial nonconvulsive seizures, and therefore I continue to think that she is going to need transfer for continuous EEG monitoring.  I will give her a single dose of Ativan 0.5 mg now, given concern for ongoing change.  Recommendations: 1) continue Keppra 1 g twice daily 2) Ativan 0.5 mg x 1  3) continue to favor transfer for continuous EEG.  Ritta Slot, MD [facility] [phone]  If 7pm- 7am, please page neurology on call as listed in AMION.

## 2022-02-03 NOTE — H&P (Signed)
History and Physical    Patient: Cheyenne Frank MRN: [id number] [place]: 02/03/2022  Date of Service: the patient was seen and examined on 02/03/2022  Patient coming from: [facility]  Chief Complaint:  Waxing and waning delirium / aphasia   HPI:   81 year old female with past medical history of paroxysmal atrial fibrillation on Eliquis, prior stroke, hypertension, insulin-dependent diabetes mellitus type 2, sleep apnea, chronic kidney disease stage IIIb, COPD, diastolic congestive heart failure (Echo 10/2021 EF 60-65%) and history of left-sided SDH who presented to [facility] emergency department with aphasia on 5/10.  Patient had been experiencing the symptoms for at least 24 hours prior to presentation to the ED.  After after a teleneurology consultation there was initial concern for stroke and therefore prior patient was hospitalized for stroke work-up.  Stroke work-up including MRI of brain and MRA revealed no evidence of stroke.  Throughout the hospitalization patient's mentation continues to wax and wane.  Patient's thorough work-up included a urinalysis that was found to be suggestive of urinary tract infection with urine cultures growing out E. coli.  Patient was treated with a 5-day course of antibiotics however this also did not seem to improve patient's waxing and waning mentation.  Neurology followed throughout patient's symptoms were felt to be possibly secondary to an underlying seizure.  Patient was initiated on [place].  Patient underwent EEG twice which failed to identify any definitive evidence of seizure activity.  Despite initiation of  Keppra patient continued to exhibit waxing and waning mentation including an episode of sudden significant aphasia and headaches the evening of 5/19 prompting stat repeat CT imaging of the head that was unremarkable.  There has yet to be a definitive diagnosis.  Neurology's current assessment is the patient may be suffering from intermittent  partial nonconvulsive seizures with hospital course complicated by waxing and waning delirium.  At this point, neurology is recommending transfer to [facility] for continuous EEG monitoring with hospital medicine's eventual plan for the patient to be discharged to [facility] skilled nursing facility on Monday 5/22 once work-up is complete.  The hospitalist group at [facility] has been contacted and patient has been accepted for transfer for continued work-up and medical care.    Review of Systems: Review of Systems  Unable to perform ROS: Mental status change    Past Medical History:  Diagnosis Date   AKI (acute kidney injury) ([place])    Anemia of chronic disease    Anxiety    Arthritis    Asthma    no problems since 1999 (after moving into a new home)   Chronic airway obstruction ([place])    Complication of anesthesia    " feel like I'm dying after I wake up"   Diabetes mellitus without complication ([place])    Diverticulosis    Dysrhythmia    unknown type    Fibromyalgia    Flat back syndrome    GERD (gastroesophageal reflux disease)    Hx of transfusion 2011   Hypertension    Itching    Macular degeneration    Nocturia    Postoperative anemia due to acute blood loss 04/09/2013   Restless leg syndrome    Sleep apnea    Dx 1990's unable to wear c-pap -   Stroke ([place]) 2010   verbal aphasia x 30 min - resolved - no problem since then    Past Surgical History:  Procedure Laterality Date   ABDOMINAL HYSTERECTOMY  rt so   BACK SURGERY  2010 / 2011   BREAST REDUCTION SURGERY     BURR HOLE FOR SUBDURAL HEMATOMA  2015   cataracts removed     [place] OF UTERUS     HERNIA REPAIR     POSTERIOR LAMINECTOMY / DECOMPRESSION LUMBAR SPINE     with T10-S1 fusion   TONSILLECTOMY     TOTAL KNEE ARTHROPLASTY Left 04/06/2013   Procedure: LEFT TOTAL KNEE ARTHROPLASTY;  Surgeon: Gearlean Alf, MD;  Location: [facility] ORS;  Service:  Orthopedics;  Laterality: Left;    Social History:  reports that she has never smoked. She has never used smokeless tobacco. She reports that she does not drink alcohol and does not use drugs.  Allergies  Allergen Reactions   Codeine Palpitations    Other Reaction: RACES HEART   Dilaudid [Hydromorphone] Other (See Comments) and Rash    Other Reaction: lightheaded, altered ms, nervous, Paranoia paranoid Other Reaction: lightheaded, altered ms, nervous, Paranoia   Demerol [Meperidine] Other (See Comments) and Rash    agitated Agitation   Pravastatin Other (See Comments)    Muscle aches   Amiodarone Other (See Comments)    Per family pt had twitching while taking and cardiologist took pt off    Lipitor [Atorvastatin] Other (See Comments) and Itching    unknown   Nifedipine Other (See Comments)    unknown   Statins Rash    Makes her crazy   Sulfa Antibiotics Rash   Vancomycin Rash    deafness    Family History  Problem Relation Age of Onset   COPD Sister        had lung transplant   COPD Brother     Prior to Admission medications   Medication Sig Start Date End Date Taking? Authorizing Provider  acetaminophen (TYLENOL) 500 MG tablet Take 500-1,000 mg by mouth every 6 (six) hours as needed for mild pain or fever.     [provider]  albuterol (VENTOLIN HFA) 108 (90 Base) MCG/ACT inhaler albuterol sulfate HFA 90 mcg/actuation aerosol inhaler  INHALE 2 PUFFS INTO LUNGS 4 TIMES DAILY 05/19/20   [provider]  apixaban (ELIQUIS) 5 MG TABS tablet Take 1 tablet (5 mg total) by mouth 2 (two) times daily. 05/22/21   Shelly Coss, MD  aspirin EC 81 MG tablet Take 1 tablet (81 mg total) by mouth daily. MAY RESUME ON 12/31/20 12/31/20   Bonnielee Haff, MD  carvedilol (COREG) 25 MG tablet Take 25 mg by mouth 2 (two) times daily.     [provider]  cephALEXin (KEFLEX) 500 MG capsule Take 1 capsule (500 mg total) by mouth 3 (three) times daily for 3 days.  02/02/22 02/05/22  Sharen Hones, MD  gabapentin (NEURONTIN) 300 MG capsule Take 300 mg by mouth 2 (two) times daily.     [provider]  hydrALAZINE (APRESOLINE) 25 MG tablet Take 1 tablet (25 mg total) by mouth 3 (three) times daily as needed (For systolic above 0000000). 10/30/21   Lorella Nimrod, MD  insulin glargine (LANTUS) 100 UNIT/ML injection Inject 0.35 mLs (35 Units total) into the skin at bedtime. 02/02/22   Sharen Hones, MD  insulin lispro (HUMALOG) 100 UNIT/ML injection Inject 0.08 mLs (8 Units total) into the skin 3 (three) times daily with meals. 02/02/22   Sharen Hones, MD  levETIRAcetam (KEPPRA) 1000 MG tablet Take 1 tablet (1,000 mg total) by mouth 2 (two) times daily.  02/02/22   Sharen Hones, MD  liver oil-zinc oxide (DESITIN) 40 % ointment Apply 1 application topically as needed for irritation.    [provider]  losartan (COZAAR) 25 MG tablet Take 1 tablet (25 mg total) by mouth daily. 10/30/21   Lorella Nimrod, MD  Multiple Vitamin (MULTIVITAMIN WITH MINERALS) TABS tablet Take 1 tablet by mouth daily.    [provider]  omeprazole (PRILOSEC) 40 MG capsule Take 40 mg by mouth daily.     [provider]  oxybutynin (DITROPAN-XL) 10 MG 24 hr tablet Take 10 mg by mouth daily. 10/09/21   [provider]  PARoxetine (PAXIL) 40 MG tablet Take 40 mg by mouth at bedtime.     [provider]  potassium chloride SA (KLOR-CON M) 20 MEQ tablet Take 1 tablet (20 mEq total) by mouth daily. 02/02/22   Sharen Hones, MD  rosuvastatin (CRESTOR) 5 MG tablet Take 2.5 mg by mouth at bedtime.    [provider]  sitaGLIPtin (JANUVIA) 50 MG tablet Take 50 mg by mouth daily.    [provider]  torsemide (DEMADEX) 20 MG tablet Take 1 tablet (20 mg total) by mouth daily. Please start from 11/01/2021 10/30/21   Lorella Nimrod, MD  traZODone (DESYREL) 50 MG tablet Take 50 mg by mouth at bedtime.     [provider]    Physical  Exam:  There were no vitals filed for this visit.  Constitutional: Lethargic but arousable, oriented x1, no associated distress.   Skin: no rashes, no lesions, poor skin turgor noted. Eyes: Pupils are equally reactive to light.  No evidence of scleral icterus or conjunctival pallor.  ENMT: Extremely dry mucous membranes noted.  Posterior pharynx clear of any exudate or lesions.   Neck: normal, supple, no masses, no thyromegaly.  No evidence of jugular venous distension.   Respiratory: clear to auscultation bilaterally, no wheezing, no crackles. Normal respiratory effort. No accessory muscle use.  Cardiovascular: Regular rate and rhythm, no murmurs / rubs / gallops. No extremity edema. 2+ pedal pulses. No carotid bruits.  Chest:   Nontender without crepitus or deformity.   Back:   Nontender without crepitus or deformity. Abdomen: Abdomen is soft and nontender.  No evidence of intra-abdominal masses.  Positive bowel sounds noted in all quadrants.   Musculoskeletal: No joint deformity upper and lower extremities. Good ROM, no contractures. Normal muscle tone.  Neurologic: Lethargic but arousable, oriented x1.  Patient not consistently following commands.  Sensation grossly intact.  Patient moving all 4 extremities spontaneously.  Patient responsive to verbal and painful stimuli.   Psychiatric: Unable to assess due to depressed sensorium.  Patient currently does not seem to possess insight as to her current situation.  Data Reviewed:  I have personally reviewed and interpreted labs, imaging.  Significant findings are:  Noncontrast CT imaging of the head performed on 5/19 revealing no acute intracranial abnormality and unchanged remote left thalamic infarct.   MRI brain without contrast performed on 5/16 revealing no acute intracranial abnormality with unchanged small vessel infarct within the ventral left thalamus.   CT angiogram of the head performed on 5/15 revealing no acute intracranial  abnormality with no large vessel occlusion.  EKG: Personally reviewed.  Rhythm is normal sinus rhythm with heart rate of 85 bpm.  No dynamic ST segment changes appreciated.   Assessment and Plan: * Seizure disorder [facility]) Concern for ongoing intermittent partial nonconvulsive seizures according to neurology This is despite patient already being on  Keppra which was initiated earlier in the hospitalization Patient has been transferred to [facility] due to recommendations for continuous EEG monitoring Case discussed with Dr. Rory Percy who is the overnight neurologist here who will try to coordinate initiation of EEG monitoring as soon as possible. Continuing Keppra in the meantime Continuing close clinical monitoring  Acute metabolic encephalopathy Waxing and waning mentation throughout the hospital course, likely multifactorial in origin Patient is currently still undergoing treatment for an E. coli urinary tract infection diagnosed earlier in the hospital stay.   Patient has now been transitioned to oral  Keflex which we will continue to completion. Waxing and waning nonconvulsive seizures thought to possibly be contributing to patient's presentation. Furthermore there is also concern for some degree of underlying dementia complicating neurologic presentation Continue to monitor clinical response to completed antibiotic course Following up on EEG results  Paroxysmal atrial fibrillation (HCC) Currently rate controlled. Continue home regimen of anticoagulation Continue home regimen of rate controlling agent Monitoring on telemetry   Uncontrolled type 2 diabetes mellitus with hyperglycemia, with long-term current use of insulin ([place]) Patient been placed on Accu-Cheks before every meal and nightly with sliding scale insulin Continuing regimen of basal bolus insulin therapy that was titrated throughout the patient's hospitalization to [facility]. Diabetic Diet   Stage 3b chronic  kidney disease (CKD) (HCC) Strict intake and output monitoring Creatinine near baseline Minimizing nephrotoxic agents as much as possible Serial chemistries to monitor renal function and electrolytes   Chronic diastolic CHF (congestive heart failure) (HCC) No clinical evidence of cardiogenic volume overload Continue maintenance regimen of diuretics   COPD (chronic obstructive pulmonary disease) (HCC) No evidence of COPD exacerbation this time As needed bronchodilator therapy for episodic shortness of breath and wheezing.   Essential hypertension Resume patients home regimen of oral antihypertensives Titrate antihypertensive regimen as necessary to achieve adequate BP control PRN intravenous antihypertensives for excessively elevated blood pressure    Sleep apnea CPAP nightly as tolerated       Code Status:  Full code  code status   Consults: Discussed with Dr. Rory Percy on arrival to [facility].  Severity of Illness:  The appropriate patient status for this patient is INPATIENT. Inpatient status is judged to be reasonable and necessary in order to provide the required intensity of service to ensure the patient's safety. The patient's presenting symptoms, physical exam findings, and initial radiographic and laboratory data in the context of their chronic comorbidities is felt to place them at high risk for further clinical deterioration. Furthermore, it is not anticipated that the patient will be medically stable for discharge from the hospital within 2 midnights of admission.   * I certify that at the point of admission it is my clinical judgment that the patient will require inpatient hospital care spanning beyond 2 midnights from the point of admission due to high intensity of service, high risk for further deterioration and high frequency of surveillance required.*  Author:  Vernelle Emerald MD  02/03/2022 9:24 PM

## 2022-02-03 NOTE — Plan of Care (Signed)

## 2022-02-03 NOTE — Assessment & Plan Note (Addendum)
-   Continue Keppra, gabapentin

## 2022-02-03 NOTE — Assessment & Plan Note (Addendum)
-   Continue carvedilol, hydralazine, losartan

## 2022-02-03 NOTE — Assessment & Plan Note (Addendum)
Appears euvolemic.  Torsemide held given poor PO intake. - Resume torsemide tomorrow

## 2022-02-03 NOTE — Progress Notes (Signed)
  Progress Note   Patient: Cheyenne Frank [id number] DOB: [birth year] DOA: 01/24/2022     9 DOS: the patient was seen and examined on 02/03/2022   Brief hospital course: Cheyenne Frank is a 81 y.o. female with medical history significant for Paroxysmal A-fib on Eliquis, prior stroke, HTN, insulin-dependent type 2 diabetes, sleep apnea, CKD stage IIIb, COPD, diastolic CHF, who presented to the hospital with getting her words out.  Reportedly, BP was 232/102 when EMS picked her up. Patient was seen by neurology, suspect patient had a seizure.  Started on Keppra. 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  Assessment and Plan: Acute metabolic encephalopathy Probably dementia. E Coli Urinary tract infection. Urine culture came back with E. coli, patient initially treated with Rocephin We will continue Rocephin while in the hospital.  Patient currently pending for transfer to [place], Suspected seizure disorder. Patient had another episode of aphasia last night, CT head ordered by neurology did not show any acute changes. Condition has resolved this morning.  We will continue Keppra per recommendation from neurology.  Currently pending for transfer to [facility] for continuous EEG monitoring to rule out intermittent seizure. Not bed last night.  Paroxysmal atrial fibrillation. History of stroke. On Eliquis and metoprolol.  Chronic diastolic congestive heart failure Hypertension urgency. Condition stable.  COPD  Obstructive sleep apnea. Patient stable, not tolerated CPAP     Hypokalemia. Improved.  Recheck a BMP tomorrow.  Generalized weakness. Patient has accepted to nursing home.  Chronic kidney disease stage IIIb. Condition stable.  Morbid obesity. Patient has BMI of 39.53, with associated congestive heart failure.  Patient has morbid obesity.   Uncontrolled type 2 diabetes with hyperglycemia on  long-term insulin use. Continue current regimen.        Subjective:  Patient had an episode of aphasia last night again.  Currently patient is confused, no agitation.  Denies any shortness of breath.  Physical Exam: Vitals:   02/02/22 1633 02/02/22 1953 02/03/22 0510 02/03/22 0820  BP: (!) 164/84 (!) 170/69 (!) 171/63 (!) 172/66  Pulse: 61 65 73 72  Resp: 18 19 16 16   Temp: 98.5 F (36.9 C) 97.9 F (36.6 C) 98.2 F (36.8 C) (!) 97.5 F (36.4 C)  TempSrc: Oral Oral Oral Oral  SpO2: 91% 95% 92% 91%  Weight:       General exam: Appears calm and comfortable, morbid obese. Respiratory system: Clear to auscultation. Respiratory effort normal. Cardiovascular system: S1 & S2 heard, RRR. No JVD, murmurs, rubs, gallops or clicks. No pedal edema. Gastrointestinal system: Abdomen is nondistended, soft and nontender. No organomegaly or masses felt. Normal bowel sounds heard. Central nervous system: Alert and oriented x2. No focal neurological deficits. Extremities: Symmetric 5 x 5 power. Skin: No rashes, lesions or ulcers   Data Reviewed:  CT head and the lab results reviewed.  Family Communication: Daughter updated at the bedside  Disposition: Status is: Inpatient Remains inpatient appropriate because: Severity of disease, altered mental status  Planned Discharge Destination:  [facility]    Time spent: 30 minutes  Author: Sharen Hones, MD 02/03/2022 10:09 AM  For on call review [email or web address].

## 2022-02-03 NOTE — Assessment & Plan Note (Addendum)
-   Continue SS corrections - Continue glargine - Continue Crestor, aspirin

## 2022-02-03 NOTE — TOC Progression Note (Signed)
Transition of Care [facility]) - Progression Note    Patient Details  Name: Cheyenne Frank MRN: [id number] Date of Birth: 1941-05-26  Transition of Care [facility]) CM/SW Contact  Ashley Royalty Lutricia Feil, RN Phone Number:[phone] 02/03/2022, 9:33 AM  Clinical Narrative:    Spoke with [facility] concerning possible admission. Facility unable to admit pt until Monday. Team aware and updated according.  TOC will continue to monitor pt for any additional needs.   Expected Discharge Plan: Skilled Nursing Facility Barriers to Discharge: Barriers Resolved  Expected Discharge Plan and Services Expected Discharge Plan: Skilled Nursing Facility       Living arrangements for the past 2 months: Single Family Home Expected Discharge Date: 02/02/22                                     Social Determinants of Health (SDOH) Interventions    Readmission Risk Interventions    01/28/2022    2:28 PM  Readmission Risk Prevention Plan  Transportation Screening Complete  PCP or Specialist Appt within 3-5 Days Complete  HRI or Home Care Consult Complete  Social Work Consult for Recovery Care Planning/Counseling Complete  Palliative Care Screening Complete  Medication Review [facility]) Complete

## 2022-02-03 NOTE — Assessment & Plan Note (Addendum)
-   Continue apixaban, Coreg

## 2022-02-03 NOTE — Assessment & Plan Note (Addendum)
Cr stable 1.3 relative to baseline

## 2022-02-03 NOTE — Assessment & Plan Note (Addendum)
No wheezing - Continue PRN albuterol

## 2022-02-03 NOTE — Progress Notes (Signed)
Patient refused CPAP for the night. Family says patient does not wear at home.

## 2022-02-03 NOTE — Progress Notes (Signed)
Patient arrived in the unit at 2000 pm from [place], A&O x1-2, anxious, situated pt comfortably in a bed, initiated telemetry monitor, and will continue to monitor closely.

## 2022-02-03 NOTE — Progress Notes (Signed)
Patient seen by Dr. Amada Jupiter at [facility] this AM. Transferred for LTM-which I have ordered. Neurology will follow on rounds in the AM tomorrow. Discussed with admitting Dr. Leafy Half. -- Milon Dikes, MD Neurologist [facility] Pager: [phone]

## 2022-02-03 NOTE — Discharge Summary (Signed)
Physician Discharge Summary   Patient: Cheyenne Frank MRN: [id number] DOB: [birth year]  Admit date:     01/24/2022  Discharge date: 02/03/22  Discharge Physician: Sharen Hones   PCP: [facility]   Recommendations at discharge:    Discharge Diagnoses: Principal Problem:   Aphasia Active Problems:   History of CVA (cerebrovascular accident)   Hypertensive urgency   Uncontrolled type 2 diabetes mellitus with hyperglycemia, with long-term current use of insulin (HCC)   Sleep apnea   Stage 3b chronic kidney disease (CKD) (HCC)   COPD (chronic obstructive pulmonary disease) (HCC)   Chronic diastolic CHF (congestive heart failure) (HCC)   Paroxysmal atrial fibrillation (HCC)   Seizure (HCC)   Acute metabolic encephalopathy   E. coli UTI   Dementia without behavioral disturbance ([place])  Resolved Problems:   * No resolved hospital problems. *  Hospital Course: Cheyenne Frank is a 81 y.o. female with medical history significant for Paroxysmal A-fib on Eliquis, prior stroke, HTN, insulin-dependent type 2 diabetes, sleep apnea, CKD stage IIIb, COPD, diastolic CHF, who presented to the hospital with getting her words out.  Reportedly, BP was 232/102 when EMS picked her up. Patient was seen by neurology, suspect patient had a seizure.  Started on Keppra. 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  Assessment and Plan: Acute metabolic encephalopathy Probably dementia. E Coli Urinary tract infection. Urine culture came back with E. coli, patient initially treated with Rocephin We will continue Rocephin while in the hospital.  Continue 2 days of oral antibiotics.  Aphasia, Suspected seizure disorder. Patient had another episode of aphasia last night, CT head ordered by neurology did not show any acute changes. Condition has resolved this morning.  We will continue Keppra per recommendation from neurology.  Currently pending  for transfer to [facility] for continuous EEG monitoring to rule out intermittent seizure.   Paroxysmal atrial fibrillation. History of stroke. On Eliquis and metoprolol.  Chronic diastolic congestive heart failure Hypertension urgency. Condition stable.  COPD  Obstructive sleep apnea. Patient stable, not tolerated CPAP     Hypokalemia. Improved.  Recheck a BMP tomorrow.  Generalized weakness. Patient has accepted to nursing home.  Chronic kidney disease stage IIIb. Condition stable.  Morbid obesity. Patient has BMI of 39.53, with associated congestive heart failure.  Patient has morbid obesity.   Uncontrolled type 2 diabetes with hyperglycemia on long-term insulin use. Continue current regimen.     Patient still has a bed in [facility], can transfer on Monday      Consultants: Neurology Procedures performed: None  Disposition:  [facility] Diet recommendation:  Discharge Diet Orders (From admission, onward)     Start     Ordered   02/02/22 0000  Diet - low sodium heart healthy        02/02/22 1231           Cardiac diet DISCHARGE MEDICATION: Allergies as of 02/03/2022       Reactions   Codeine Palpitations   Other Reaction: RACES HEART   Dilaudid [hydromorphone] Other (See Comments), Rash   Other Reaction: lightheaded, altered ms, nervous, Paranoia paranoid Other Reaction: lightheaded, altered ms, nervous, Paranoia   Demerol [meperidine] Other (See Comments), Rash   agitated Agitation   Pravastatin Other (See Comments)   Muscle aches   Amiodarone Other (See Comments)   Per family pt had twitching while taking and cardiologist took pt off    Lipitor [  atorvastatin] Other (See Comments), Itching   unknown   Nifedipine Other (See Comments)   unknown   Statins Rash   Makes her crazy   Sulfa Antibiotics Rash   Vancomycin Rash   deafness        Medication List     TAKE these medications    acetaminophen 500 MG  tablet Commonly known as: TYLENOL Take 500-1,000 mg by mouth every 6 (six) hours as needed for mild pain or fever.   albuterol 108 (90 Base) MCG/ACT inhaler Commonly known as: VENTOLIN HFA albuterol sulfate HFA 90 mcg/actuation aerosol inhaler  INHALE 2 PUFFS INTO LUNGS 4 TIMES DAILY   apixaban 5 MG Tabs tablet Commonly known as: Eliquis Take 1 tablet (5 mg total) by mouth 2 (two) times daily.   aspirin EC 81 MG tablet Take 1 tablet (81 mg total) by mouth daily. MAY RESUME ON 12/31/20   carvedilol 25 MG tablet Commonly known as: COREG Take 25 mg by mouth 2 (two) times daily.   cephALEXin 500 MG capsule Commonly known as: KEFLEX Take 1 capsule (500 mg total) by mouth 3 (three) times daily for 3 days.   gabapentin 300 MG capsule Commonly known as: NEURONTIN Take 300 mg by mouth 2 (two) times daily.   hydrALAZINE 25 MG tablet Commonly known as: APRESOLINE Take 1 tablet (25 mg total) by mouth 3 (three) times daily as needed (For systolic above 0000000).   insulin glargine 100 UNIT/ML injection Commonly known as: LANTUS Inject 0.35 mLs (35 Units total) into the skin at bedtime. What changed: how much to take   insulin lispro 100 UNIT/ML injection Commonly known as: HumaLOG Inject 0.08 mLs (8 Units total) into the skin 3 (three) times daily with meals. What changed: how much to take   levETIRAcetam 1000 MG tablet Commonly known as: KEPPRA Take 1 tablet (1,000 mg total) by mouth 2 (two) times daily.   liver oil-zinc oxide 40 % ointment Commonly known as: DESITIN Apply 1 application topically as needed for irritation.   losartan 25 MG tablet Commonly known as: COZAAR Take 1 tablet (25 mg total) by mouth daily.   multivitamin with minerals Tabs tablet Take 1 tablet by mouth daily.   omeprazole 40 MG capsule Commonly known as: PRILOSEC Take 40 mg by mouth daily.   oxybutynin 10 MG 24 hr tablet Commonly known as: DITROPAN-XL Take 10 mg by mouth daily.   PARoxetine 40  MG tablet Commonly known as: PAXIL Take 40 mg by mouth at bedtime.   potassium chloride SA 20 MEQ tablet Commonly known as: KLOR-CON M Take 1 tablet (20 mEq total) by mouth daily.   rosuvastatin 5 MG tablet Commonly known as: CRESTOR Take 2.5 mg by mouth at bedtime.   sitaGLIPtin 50 MG tablet Commonly known as: JANUVIA Take 50 mg by mouth daily.   torsemide 20 MG tablet Commonly known as: DEMADEX Take 1 tablet (20 mg total) by mouth daily. Please start from 11/01/2021   traZODone 50 MG tablet Commonly known as: DESYREL Take 50 mg by mouth at bedtime.        Contact information for follow-up providers     [facility] [place] [facility] Follow up in 1 week(s).   Specialty: Family Medicine Contact information: [address] [place] [address] [facility] Follow up in 2 week(s).   Contact information: [place] [phone]  Contact information for after-discharge care     [facility] .   Service: Skilled Nursing Contact information: [place] [phone]                    Discharge Exam: Danley Danker Weights   01/25/22 1300  Weight: 111.1 kg   General exam: Appears calm and comfortable  Respiratory system: Clear to auscultation. Respiratory effort normal. Cardiovascular system: S1 & S2 heard, RRR. No JVD, murmurs, rubs, gallops or clicks. No pedal edema. Gastrointestinal system: Abdomen is nondistended, soft and nontender. No organomegaly or masses felt. Normal bowel sounds heard. Central nervous system: Alert and oriented x1. No focal neurological deficits. Extremities: Symmetric 5 x 5 power. Skin: No rashes, lesions or ulcers Psychiatry: Judgement and insight appear  normal. Mood & affect appropriate.    Condition at discharge: fair  The results of significant diagnostics from this hospitalization (including imaging, microbiology, ancillary and laboratory) are listed below for reference.   Imaging Studies: CT HEAD WO CONTRAST (5MM)  Result Date: 02/02/2022 CLINICAL DATA:  Headache, new or worsening (Age >= 50y) EXAM: CT HEAD WITHOUT CONTRAST TECHNIQUE: Contiguous axial images were obtained from the base of the skull through the vertex without intravenous contrast. RADIATION DOSE REDUCTION: This exam was performed according to the departmental dose-optimization program which includes automated exposure control, adjustment of the mA and/or kV according to patient size and/or use of iterative reconstruction technique. COMPARISON:  Brain MRI 01/30/2022, 3 days ago, head CT 01/29/2022 FINDINGS: Brain: Scratched no intracranial hemorrhage, mass effect, or midline shift. No hydrocephalus. The basilar cisterns are patent. Unchanged remote left thalamic infarct. No evidence of territorial infarct or acute ischemia. No extra-axial or intracranial fluid collection. Vascular: Atherosclerosis of skullbase vasculature without hyperdense vessel or abnormal calcification. Skull: No fracture or focal lesion. Remote left-sided craniotomy defect. Sinuses/Orbits: Scattered mucosal thickening of paranasal sinuses, unchanged. No acute findings. Bilateral cataract resection. Other: None. IMPRESSION: 1. No acute intracranial abnormality. 2. Unchanged remote left thalamic infarct. Electronically Signed   By: Keith Rake M.D.   On: 02/02/2022 23:49   CT Head Wo Contrast  Result Date: 01/24/2022 CLINICAL DATA:  Stroke suspected neuro deficit EXAM: CT HEAD WITHOUT CONTRAST TECHNIQUE: Contiguous axial images were obtained from the base of the skull through the vertex without intravenous contrast. RADIATION DOSE REDUCTION: This exam was performed according to the departmental  dose-optimization program which includes automated exposure control, adjustment of the mA and/or kV according to patient size and/or use of iterative reconstruction technique. COMPARISON:  01/13/2021 FINDINGS: Brain: No evidence of acute infarction, hemorrhage, cerebral edema, mass, mass effect, or midline shift. No hydrocephalus or extra-axial fluid collection. Redemonstrated left thalamic lacunar infarct. Periventricular white matter changes, likely the sequela of chronic small vessel ischemic disease. Cerebral volume loss is likely within normal limits for age. Vascular: No hyperdense vessel. Atherosclerotic calcifications in the intracranial carotid and vertebral arteries. Skull: Hyperostosis frontalis. Left frontal burr hole. Negative for fracture or focal lesion. Sinuses/Orbits: No acute finding. Other: The mastoid air cells are well aerated. IMPRESSION: No acute intracranial process. Electronically Signed   By: Merilyn Baba M.D.   On: 01/24/2022 18:53   MR ANGIO HEAD WO CONTRAST  Result Date: 01/24/2022 CLINICAL DATA:  Initial evaluation for neuro deficit, stroke suspected. EXAM: MRI HEAD WITHOUT CONTRAST MRA HEAD WITHOUT CONTRAST MRA NECK WITHOUT AND WITH CONTRAST TECHNIQUE:  Multiplanar, multi-echo pulse sequences of the brain and surrounding structures were acquired without intravenous contrast. Angiographic images of the Circle of Willis were acquired using MRA technique without intravenous contrast. Angiographic images of the neck were acquired using MRA technique without and with intravenous contrast. Carotid stenosis measurements (when applicable) are obtained utilizing NASCET criteria, using the distal internal carotid diameter as the denominator. CONTRAST:  None. COMPARISON:  Comparison made with prior CT from earlier the same day. FINDINGS: MRI HEAD FINDINGS Brain: Examination technically limited as the patient was unable to tolerate the full length of the study. Axial and coronal DWI sequences,  with axial T2 and sagittal T1 weighted sequences only were performed. Additionally, the provided images are degraded by motion. Generalized age-related cerebral atrophy. Remote lacunar infarct present at the ventral left thalamus. No abnormal foci of restricted diffusion to suggest acute or subacute ischemia. Gray-white matter differentiation otherwise grossly maintained. No other visible areas of chronic cortical infarction. No obvious evidence for intracranial hemorrhage on this limited exam. No visible mass lesion, mass effect or midline shift. Ventricles normal size without hydrocephalus. No extra-axial fluid collection. Vascular: Major intracranial vascular flow voids are grossly maintained at the skull base. Skull and upper cervical spine: Craniocervical junction grossly within normal limits. No obvious focal marrow replacing lesion. No scalp soft tissue abnormality. Sinuses/Orbits: Prior bilateral ocular lens replacement. Globes and orbital soft tissues demonstrate no definite acute abnormality. Mild scattered mucosal thickening about the ethmoidal air cells. Paranasal sinuses are otherwise largely clear. No significant mastoid effusion. Other: None. MRA HEAD FINDINGS Not performed as the patient was unable to tolerate this portion of the ordered study. MRA NECK FINDINGS Not performed as the patient was unable to tolerate this portion of the ordered study. IMPRESSION: 1. Technically limited exam due to the patient's inability to tolerate the full length of the study and motion artifact. 2. No acute intracranial infarct. No other definite acute abnormality identified. 3. Age-related cerebral atrophy with remote left thalamic lacunar infarct. 4. Please note that the ordered MRAs of the head and neck were unable to be performed due to the patient's inability to tolerate the full length of the study. No charges should be applied to the patient. A repeat examination could be attempted at a later time as the  patient is able to tolerate. Alternatively, correlation with dedicated CTA and/or carotid Doppler ultrasound could also be performed as warranted. Electronically Signed   By: Jeannine Boga M.D.   On: 01/24/2022 22:02   MR Angiogram Neck W or Wo Contrast  Result Date: 01/24/2022 CLINICAL DATA:  Initial evaluation for neuro deficit, stroke suspected. EXAM: MRI HEAD WITHOUT CONTRAST MRA HEAD WITHOUT CONTRAST MRA NECK WITHOUT AND WITH CONTRAST TECHNIQUE: Multiplanar, multi-echo pulse sequences of the brain and surrounding structures were acquired without intravenous contrast. Angiographic images of the Circle of Willis were acquired using MRA technique without intravenous contrast. Angiographic images of the neck were acquired using MRA technique without and with intravenous contrast. Carotid stenosis measurements (when applicable) are obtained utilizing NASCET criteria, using the distal internal carotid diameter as the denominator. CONTRAST:  None. COMPARISON:  Comparison made with prior CT from earlier the same day. FINDINGS: MRI HEAD FINDINGS Brain: Examination technically limited as the patient was unable to tolerate the full length of the study. Axial and coronal DWI sequences, with axial T2 and sagittal T1 weighted sequences only were performed. Additionally, the provided images are degraded by motion. Generalized age-related cerebral atrophy. Remote lacunar infarct present at the  ventral left thalamus. No abnormal foci of restricted diffusion to suggest acute or subacute ischemia. Gray-white matter differentiation otherwise grossly maintained. No other visible areas of chronic cortical infarction. No obvious evidence for intracranial hemorrhage on this limited exam. No visible mass lesion, mass effect or midline shift. Ventricles normal size without hydrocephalus. No extra-axial fluid collection. Vascular: Major intracranial vascular flow voids are grossly maintained at the skull base. Skull and upper  cervical spine: Craniocervical junction grossly within normal limits. No obvious focal marrow replacing lesion. No scalp soft tissue abnormality. Sinuses/Orbits: Prior bilateral ocular lens replacement. Globes and orbital soft tissues demonstrate no definite acute abnormality. Mild scattered mucosal thickening about the ethmoidal air cells. Paranasal sinuses are otherwise largely clear. No significant mastoid effusion. Other: None. MRA HEAD FINDINGS Not performed as the patient was unable to tolerate this portion of the ordered study. MRA NECK FINDINGS Not performed as the patient was unable to tolerate this portion of the ordered study. IMPRESSION: 1. Technically limited exam due to the patient's inability to tolerate the full length of the study and motion artifact. 2. No acute intracranial infarct. No other definite acute abnormality identified. 3. Age-related cerebral atrophy with remote left thalamic lacunar infarct. 4. Please note that the ordered MRAs of the head and neck were unable to be performed due to the patient's inability to tolerate the full length of the study. No charges should be applied to the patient. A repeat examination could be attempted at a later time as the patient is able to tolerate. Alternatively, correlation with dedicated CTA and/or carotid Doppler ultrasound could also be performed as warranted. Electronically Signed   By: Jeannine Boga M.D.   On: 01/24/2022 22:02   MR BRAIN WO CONTRAST  Result Date: 01/30/2022 CLINICAL DATA:  Altered mental status and acute neurologic deficit EXAM: MRI HEAD WITHOUT CONTRAST TECHNIQUE: Multiplanar, multiecho pulse sequences of the brain and surrounding structures were obtained without intravenous contrast. COMPARISON:  01/24/2022 FINDINGS: Severely motion degraded examination. Brain: No acute infarct, mass effect or extra-axial collection. Small vessel infarct within the ventral left thalamus is unchanged. There is multifocal hyperintense  T2-weighted signal within the white matter. Generalized cerebral volume loss. The midline structures are normal. Vascular: Major flow voids are preserved. Skull and upper cervical spine: Normal calvarium and skull base. Visualized upper cervical spine and soft tissues are normal. Sinuses/Orbits:No paranasal sinus fluid levels or advanced mucosal thickening. No mastoid or middle ear effusion. Normal orbits. IMPRESSION: 1. Severely motion degraded examination. 2. No acute intracranial abnormality. 3. Unchanged small vessel infarct within the ventral left thalamus. Electronically Signed   By: Ulyses Jarred M.D.   On: 01/30/2022 01:34   MR BRAIN WO CONTRAST  Result Date: 01/24/2022 CLINICAL DATA:  Initial evaluation for neuro deficit, stroke suspected. EXAM: MRI HEAD WITHOUT CONTRAST MRA HEAD WITHOUT CONTRAST MRA NECK WITHOUT AND WITH CONTRAST TECHNIQUE: Multiplanar, multi-echo pulse sequences of the brain and surrounding structures were acquired without intravenous contrast. Angiographic images of the Circle of Willis were acquired using MRA technique without intravenous contrast. Angiographic images of the neck were acquired using MRA technique without and with intravenous contrast. Carotid stenosis measurements (when applicable) are obtained utilizing NASCET criteria, using the distal internal carotid diameter as the denominator. CONTRAST:  None. COMPARISON:  Comparison made with prior CT from earlier the same day. FINDINGS: MRI HEAD FINDINGS Brain: Examination technically limited as the patient was unable to tolerate the full length of the study. Axial and coronal DWI sequences, with axial  T2 and sagittal T1 weighted sequences only were performed. Additionally, the provided images are degraded by motion. Generalized age-related cerebral atrophy. Remote lacunar infarct present at the ventral left thalamus. No abnormal foci of restricted diffusion to suggest acute or subacute ischemia. Gray-white matter  differentiation otherwise grossly maintained. No other visible areas of chronic cortical infarction. No obvious evidence for intracranial hemorrhage on this limited exam. No visible mass lesion, mass effect or midline shift. Ventricles normal size without hydrocephalus. No extra-axial fluid collection. Vascular: Major intracranial vascular flow voids are grossly maintained at the skull base. Skull and upper cervical spine: Craniocervical junction grossly within normal limits. No obvious focal marrow replacing lesion. No scalp soft tissue abnormality. Sinuses/Orbits: Prior bilateral ocular lens replacement. Globes and orbital soft tissues demonstrate no definite acute abnormality. Mild scattered mucosal thickening about the ethmoidal air cells. Paranasal sinuses are otherwise largely clear. No significant mastoid effusion. Other: None. MRA HEAD FINDINGS Not performed as the patient was unable to tolerate this portion of the ordered study. MRA NECK FINDINGS Not performed as the patient was unable to tolerate this portion of the ordered study. IMPRESSION: 1. Technically limited exam due to the patient's inability to tolerate the full length of the study and motion artifact. 2. No acute intracranial infarct. No other definite acute abnormality identified. 3. Age-related cerebral atrophy with remote left thalamic lacunar infarct. 4. Please note that the ordered MRAs of the head and neck were unable to be performed due to the patient's inability to tolerate the full length of the study. No charges should be applied to the patient. A repeat examination could be attempted at a later time as the patient is able to tolerate. Alternatively, correlation with dedicated CTA and/or carotid Doppler ultrasound could also be performed as warranted. Electronically Signed   By: Jeannine Boga M.D.   On: 01/24/2022 22:02   US Carotid Bilateral (at [facility] and [facility] only)  Result Date: 01/25/2022 CLINICAL DATA:  Initial evaluation  for neuro deficit, stroke. EXAM: BILATERAL CAROTID DUPLEX ULTRASOUND TECHNIQUE: Pearline Cables scale imaging, color Doppler and duplex ultrasound were performed of bilateral carotid and vertebral arteries in the neck. COMPARISON:  Prior brain MRI from 01/24/2022. FINDINGS: Criteria: Quantification of carotid stenosis is based on velocity parameters that correlate the residual internal carotid diameter with NASCET-based stenosis levels, using the diameter of the distal internal carotid lumen as the denominator for stenosis measurement. The following velocity measurements were obtained: RIGHT ICA: 73/13 cm/sec CCA: [id number] cm/sec SYSTOLIC ICA/CCA RATIO:  1.0 ECA: 66 cm/sec LEFT ICA: 99/21 cm/sec CCA: 0000000 cm/sec SYSTOLIC ICA/CCA RATIO:  1.2 ECA: 106 cm/sec RIGHT CAROTID ARTERY: Scattered intimal atherosclerotic irregularity within the visualized right CCA without hemodynamically significant stenosis. Mild heterogeneous atherosclerotic plaque present about the right carotid bulb extending into the proximal right ICA. No elevation in peak systolic velocity to suggest hemodynamically significant stenosis. Visualized right ICA patent distally without stenosis. RIGHT VERTEBRAL ARTERY:  Patent with antegrade flow. LEFT CAROTID ARTERY: Mild smooth intimal thickening within the visualized left CCA without stenosis. Mild for age atheromatous irregularity about the left carotid bulb, extending into the proximal left ICA. No elevation in peak systolic velocity to suggest hemodynamically significant stenosis. Visualized left ICA widely patent distally without stenosis. LEFT VERTEBRAL ARTERY:  Patent with antegrade flow. IMPRESSION: 1. Mild atherosclerotic irregularity and plaque about the bilateral carotid bulbs, extending into the proximal internal carotid arteries, right greater than left, but with no evidence for hemodynamically significant stenosis. 2. Patent antegrade flow within both  vertebral arteries. Electronically Signed   By:  Jeannine Boga M.D.   On: 01/25/2022 02:47   EEG adult  Result Date: 01/29/2022 Greta Doom, MD     01/29/2022  4:01 PM History: 81 yo F with intermittent aphasia Sedation: None Technique: This EEG was acquired with electrodes placed according to the International 10-20 electrode system (including Fp1, Fp2, F3, F4, C3, C4, P3, P4, O1, O2, T3, T4, T5, T6, A1, A2, Fz, Cz, Pz). The following electrodes were missing or displaced: none. Background: The background consists of intermixed alpha and beta activities. There is a well defined posterior dominant rhythm of 9 Hz that attenuates with eye opening. Sleep is recorded with normal appearing structures. Photic stimulation: Physiologic driving is not performed EEG Abnormalities: none Clinical Interpretation: This normal EEG is recorded in the waking and sleep state. There was no seizure or seizure predisposition recorded on this study. Please note that lack of epileptiform activity on EEG does not preclude the possibility of epilepsy. Roland Rack, MD [facility] [phone] If 7pm- 7am, please page neurology on call as listed in [place].   EEG adult  Result Date: 01/25/2022 Lora Havens, MD     01/25/2022  6:58 PM Patient Name: Cheyenne Frank MRN: [id number] Epilepsy Attending: Lora Havens Referring Physician/Provider: Lorenza Chick, MD Date: 01/25/2022 Duration: 22.31 mins Patient history: 81 y.o. female with medical history significant for Paroxysmal A-fib on Eliquis, prior stroke presenting to the ED with difficulty getting her words out similar to her prior strokes. EEG to evaluate for seizure Level of alertness: Awake, asleep AEDs during EEG study: LEV Technical aspects: This EEG study was done with scalp electrodes positioned according to the 10-20 International system of electrode placement. Electrical activity was acquired at a sampling rate of 500Hz  and reviewed with a high frequency filter of 70Hz  and a low  frequency filter of 1Hz . EEG data were recorded continuously and digitally stored. Description: The posterior dominant rhythm consists of 8-9 Hz activity of moderate voltage (25-35 uV) seen predominantly in posterior head regions, symmetric and reactive to eye opening and eye closing. Sleep was characterized by vertex waves, sleep spindles (12 to 14 Hz), maximal frontocentral region. EEG showed intermittent generalized 3 to 6 Hz theta-delta slowing.  Hyperventilation and photic stimulation were not performed.   ABNORMALITY - Intermittent slow, generalized IMPRESSION: This study is suggestive of mild diffuse encephalopathy, nonspecific etiology. No seizures or epileptiform discharges were seen throughout the recording. Priyanka Barbra Sarks   CT ANGIO HEAD NECK W WO CM (CODE STROKE)  Result Date: 01/29/2022 CLINICAL DATA:  Neuro deficit, acute, stroke suspected EXAM: CT ANGIOGRAPHY HEAD AND NECK TECHNIQUE: Multidetector CT imaging of the head and neck was performed using the standard protocol during bolus administration of intravenous contrast. Multiplanar CT image reconstructions and MIPs were obtained to evaluate the vascular anatomy. Carotid stenosis measurements (when applicable) are obtained utilizing NASCET criteria, using the distal internal carotid diameter as the denominator. RADIATION DOSE REDUCTION: This exam was performed according to the departmental dose-optimization program which includes automated exposure control, adjustment of the mA and/or kV according to patient size and/or use of iterative reconstruction technique. CONTRAST:  58mL OMNIPAQUE IOHEXOL 350 MG/ML SOLN COMPARISON:  CT head 01/24/2022 FINDINGS: CT HEAD Brain: There is no acute intracranial hemorrhage, mass effect, or edema. No new loss of gray-white differentiation. Chronic left thalamic infarct. Stable findings of probable chronic microvascular ischemic changes in the cerebral white matter. There is no extra-axial fluid collection.  Ventricles and sulci are stable in size and configuration. Vascular: No hyperdense vessel. Skull: Small left craniotomy. Sinuses/Orbits: No acute finding.  Patchy mucosal thickening. Other: None. Review of the MIP images confirms the above findings CTA NECK Streak artifact from dental amalgam. Aortic arch: Mild calcified plaque. Great vessel origins are patent. Right carotid system: Patent. No stenosis. Partially retropharyngeal course. Left carotid system: Patent.  No stenosis. Vertebral arteries: Patent and codominant.  No stenosis. Skeleton: Degenerative changes of the included spine. Degenerative changes of the temporomandibular joints. Other neck: Unremarkable. Upper chest: No apical lung mass. Review of the MIP images confirms the above findings CTA HEAD Anterior circulation: Intracranial internal carotid arteries are patent minimal calcified plaque. Anterior cerebral arteries are patent. Left A1 ACA is dominant. Anterior communicating artery is present. Middle cerebral arteries are patent. Posterior circulation: Intracranial vertebral arteries, basilar artery, and posterior cerebral arteries are patent. Venous sinuses: Patent as allowed by contrast bolus timing. Review of the MIP images confirms the above findings IMPRESSION: No acute intracranial abnormality. No evidence of evolving recent infarction. No large vessel occlusion, hemodynamically significant stenosis, or evidence of dissection. Electronically Signed   By: Macy Mis M.D.   On: 01/29/2022 13:00    Microbiology: Results for orders placed or performed during the hospital encounter of 01/24/22  Urine Culture     Status: Abnormal   Collection Time: 01/31/22  3:59 PM   Specimen: Urine, Clean Catch  Result Value Ref Range Status   Specimen Description   Final    URINE, CLEAN CATCH Performed at [facility], [address], [place], [place] [address]    Special Requests   Final    NONE Performed at [facility],  [place],  [address]    Culture >=100,000 COLONIES/mL ESCHERICHIA COLI (A)  Final   Report Status 02/02/2022 FINAL  Final   Organism ID, Bacteria ESCHERICHIA COLI (A)  Final      Susceptibility   Escherichia coli - MIC*    AMPICILLIN 4 SENSITIVE Sensitive     CEFAZOLIN <=4 SENSITIVE Sensitive     CEFEPIME <=0.12 SENSITIVE Sensitive     CEFTRIAXONE <=0.25 SENSITIVE Sensitive     CIPROFLOXACIN <=0.25 SENSITIVE Sensitive     GENTAMICIN <=1 SENSITIVE Sensitive     IMIPENEM <=0.25 SENSITIVE Sensitive     NITROFURANTOIN <=16 SENSITIVE Sensitive     TRIMETH/SULFA <=20 SENSITIVE Sensitive     AMPICILLIN/SULBACTAM <=2 SENSITIVE Sensitive     PIP/TAZO <=4 SENSITIVE Sensitive     * >=100,000 COLONIES/mL ESCHERICHIA COLI    Labs: CBC: No results for input(s): WBC, NEUTROABS, HGB, HCT, MCV, PLT in the last 168 hours. Basic Metabolic Panel: Recent Labs  Lab 01/29/22 0527 01/31/22 0421  NA 144 143  K 3.3* 4.1  CL 105 103  CO2 32 31  GLUCOSE 70 205*  BUN 20 22  CREATININE 1.11* 1.19*  CALCIUM 9.1 9.1  MG  --  2.1   Liver Function Tests: No results for input(s): AST, ALT, ALKPHOS, BILITOT, PROT, ALBUMIN in the last 168 hours. CBG: Recent Labs  Lab 02/02/22 1638 02/02/22 2037 02/03/22 0729 02/03/22 1016 02/03/22 1645  GLUCAP 220* 143* 174* 223* 276*    Discharge time spent: greater than 30 minutes.  Signed: Sharen Hones, MD [facility] 02/03/2022

## 2022-02-03 NOTE — Assessment & Plan Note (Addendum)
At baseline the patient is oriented and participates in cares.  Maybe has some mild memory loss, and has mild aphasia at times as a result of her prior stroke and subdural hemorrhage.  Here she was initially brought in for what appeared to be slurred speech and worse aphasia, evaluated by neurology who felt this was most likely recrudescence of symptoms in the setting of hypoglycemia or hypertension but with the small possibility that it was focal seizures.   While she initially improved here in the hospital, she had some further episodes of hypoglycemia and her aphasia and disorientation returned worse than previous so she was transferred to [facility] for long-term EEG monitoring to capture any possible focal seizures.  She has not had 48 hours of EEG monitoring and 0 seizures have been observed.  I think it is exceedingly unlikely that that is the case, and agree that most likely this is recrudescence of symptoms, as well as anxiety and poor ability to compensate for aphasia when she is stressed  Her encephalopathy seems better today, she is pleasant and participating in feeding Delirium precautions:   -Lights and TV off, minimize interruptions at night  -Blinds open and lights on during day  -Glasses/hearing aid with patient  -Frequent reorientation  -PT/OT when able  -Avoid sedation medications/Beers list medications

## 2022-02-04 DIAGNOSIS — G9341 Metabolic encephalopathy: Secondary | ICD-10-CM | POA: Diagnosis not present

## 2022-02-04 DIAGNOSIS — I5032 Chronic diastolic (congestive) heart failure: Secondary | ICD-10-CM | POA: Diagnosis not present

## 2022-02-04 DIAGNOSIS — G40909 Epilepsy, unspecified, not intractable, without status epilepticus: Secondary | ICD-10-CM | POA: Diagnosis not present

## 2022-02-04 LAB — GLUCOSE, CAPILLARY
Glucose-Capillary: 180 mg/dL — ABNORMAL HIGH (ref 70–99)
Glucose-Capillary: 168 mg/dL — ABNORMAL HIGH (ref 70–99)
Glucose-Capillary: 98 mg/dL (ref 70–99)
Glucose-Capillary: 71 mg/dL (ref 70–99)
Glucose-Capillary: 76 mg/dL (ref 70–99)

## 2022-02-04 MED ORDER — LACTATED RINGERS IV SOLN: INTRAVENOUS | Status: AC

## 2022-02-04 MED ORDER — SODIUM CHLORIDE 0.9 % IV SOLN: INTRAVENOUS | Status: DC

## 2022-02-04 NOTE — Evaluation (Signed)
Clinical/Bedside Swallow Evaluation Patient Details  Name: Cheyenne Frank MRN: [id number] Date of Birth: Jan 23, 1941  Today's Date: 02/04/2022 Time: SLP Start Time (ACUTE ONLY): 67 SLP Stop Time (ACUTE ONLY): 1725 SLP Time Calculation (min) (ACUTE ONLY): 15 min  Past Medical History:  Past Medical History:  Diagnosis Date   AKI (acute kidney injury) ([place])    Anemia of chronic disease    Anxiety    Arthritis    Asthma    no problems since 1999 (after moving into a new home)   Chronic airway obstruction ([place])    Complication of anesthesia    " feel like I'm dying after I wake up"   Diabetes mellitus without complication ([place])    Diverticulosis    Dysrhythmia    unknown type    Fibromyalgia    Flat back syndrome    GERD (gastroesophageal reflux disease)    Hx of transfusion 2011   Hypertension    Itching    Macular degeneration    Nocturia    Postoperative anemia due to acute blood loss 04/09/2013   Restless leg syndrome    Sleep apnea    Dx 1990's unable to wear c-pap -   Stroke ([place]) 2010   verbal aphasia x 30 min - resolved - no problem since then   Past Surgical History:  Past Surgical History:  Procedure Laterality Date   ABDOMINAL HYSTERECTOMY     rt so   BACK SURGERY  2010 / 2011   BREAST [place]  2015   cataracts removed     [place] / [place]     with T10-S1 fusion   TONSILLECTOMY     TOTAL KNEE ARTHROPLASTY Left 04/06/2013   Procedure: LEFT TOTAL KNEE ARTHROPLASTY;  Surgeon: Gearlean Alf, MD;  Location: [facility] ORS;  Service: Orthopedics;  Laterality: Left;   HPI:  Patient is an 81 y.o. female with PMH: paroxysmal atrial fibrillation on Eliquis, prior stroke, hypertension, insulin-dependent diabetes mellitus type 2, sleep apnea, chronic kidney disease stage IIIb, COPD, diastolic congestive heart  failure (Echo 10/2021 EF 60-65%) and history of left-sided SDH who presented to [facility] emergency department with aphasia on 5/10. Stroke workup including MRI of brain and MRA both were negative for CVA. Patient's mentation continued to wax and wane and UA was suggestive of UTI with E-coli. Patient was started on 5 day course of antibiotics but no improvement in mentation. Neurology felt patient's symptoms were possibly secondary to underlying seizure and Keppra initiated but still no improvement in mentation. She exhibited episode of sudden onset significant aphasia and headaches 5/19 in evening and stat CT ordered but results were unremarkable. Patient will undergo continuous EEG monitoring and discharge to [facility] on Monday when work-up complete.    Assessment / Plan / Recommendation  Clinical Impression  Patient presents with what appears to be a mild, cognitive-based oral dysphagia and suspected mild pharyngeal phase dysphagia. Patient was being fed by NT when SLP entered room. She exhibited mildly delayed mastication of solids which appeared to be due to her fluctuating attention. NT reported that with lunch today she would require cues to chew food and at times patient would seem to forget that she was eating. No immediate throat clearing or coughing observed with solids or liquids but patient did exhibit  a delayed, harsh sounding and non-productive cough with SLP suspecting potential GERD symptoms/cause. Patient's voice remained clear and she started to fall asleep after SLP assisted with oral care. SLP recommending continue regular solids, thin liquids and SLP will follow patient while admitted to ensure diet toleration. SLP Visit Diagnosis: Dysphagia, unspecified (R13.10)    Aspiration Risk  No limitations;Mild aspiration risk    Diet Recommendation Regular;Thin liquid   Liquid Administration via: Cup;Straw Medication Administration: Whole meds with puree Supervision: Full  supervision/cueing for compensatory strategies;Staff to assist with self feeding Compensations: Minimize environmental distractions;Small sips/bites;Slow rate Postural Changes: Seated upright at 90 degrees;Remain upright for at least 30 minutes after po intake    Other  Recommendations Oral Care Recommendations: Oral care BID;Staff/trained caregiver to provide oral care    Recommendations for follow up therapy are one component of a multi-disciplinary discharge planning process, led by the attending physician.  Recommendations may be updated based on patient status, additional functional criteria and insurance authorization.  Follow up Recommendations Skilled nursing-short term rehab (<3 hours/day)      Assistance Recommended at Discharge Frequent or constant Supervision/Assistance  Functional Status Assessment Patient has had a recent decline in their functional status and demonstrates the ability to make significant improvements in function in a reasonable and predictable amount of time.  Frequency and Duration min 1 x/week  1 week       Prognosis Prognosis for Safe Diet Advancement: Good Barriers to Reach Goals: Cognitive deficits      Swallow Study   General Date of Onset: 02/04/22 HPI: Patient is an 81 y.o. female with PMH: paroxysmal atrial fibrillation on Eliquis, prior stroke, hypertension, insulin-dependent diabetes mellitus type 2, sleep apnea, chronic kidney disease stage IIIb, COPD, diastolic congestive heart failure (Echo 10/2021 EF 60-65%) and history of left-sided SDH who presented to [facility] emergency department with aphasia on 5/10. Stroke workup including MRI of brain and MRA both were negative for CVA. Patient's mentation continued to wax and wane and UA was suggestive of UTI with E-coli. Patient was started on 5 day course of antibiotics but no improvement in mentation. Neurology felt patient's symptoms were possibly secondary to underlying seizure and Keppra initiated but  still no improvement in mentation. She exhibited episode of sudden onset significant aphasia and headaches 5/19 in evening and stat CT ordered but results were unremarkable. Patient will undergo continuous EEG monitoring and discharge to [facility] on Monday when work-up complete. Type of Study: Bedside Swallow Evaluation Previous Swallow Assessment: BSE at [facility] 01/24/2022 Diet Prior to this Study: Regular;Thin liquids Temperature Spikes Noted: No Respiratory Status: Nasal cannula History of Recent Intubation: No Behavior/Cognition: Alert;Cooperative;Pleasant mood;Distractible;Requires cueing Oral Cavity Assessment: Within Functional Limits Oral Care Completed by SLP: No Oral Cavity - Dentition: Adequate natural dentition Vision: Functional for self-feeding Self-Feeding Abilities: Total assist Patient Positioning: Upright in bed Baseline Vocal Quality: Normal Volitional Cough: Strong Volitional Swallow: Unable to elicit    Oral/Motor/Sensory Function Overall Oral Motor/Sensory Function: Within functional limits   Ice Chips     Thin Liquid Thin Liquid: Impaired Presentation: Straw Pharyngeal  Phase Impairments: Cough - Delayed Other Comments: delayed harsh sounding, non-productive cough    Nectar Thick     Honey Thick     Puree Puree: Within functional limits   Solid     Solid: Impaired Oral Phase Functional Implications: Impaired mastication;Prolonged oral transit     Sonia Baller, MA, CCC-SLP Speech Therapy

## 2022-02-04 NOTE — Hospital Course (Addendum)
Cheyenne Frank is an 81 y.o. 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   At baseline, patient evidently has spells of difficulty speaking 1-2 times per week, usually associated with hypo- or hyperglycemia (has about 2 lows in the 40s per month, and slightly more frequent highs over the readable on her machine).  Also potentially a correlation with "stress".  Also noted a progressive decline in mother's overall cognition and function, worsening incontinence over months.  She was admitted at [facility] first, seen by Neurology who felt that her condition was possibly focal seizures but more likely recrudescence of old deficits in setting of hypertension, as well as possibly a component of delirium.  The episodes seemed to clear, so she was being prepared for discharge to SNF when she had a low blood sugar in the 40s on 5/16 (hospital day 4) and had recurrence of symptoms.    Subsequently, consideration of focal seizures was thought more urgent to rule out, so she was transferred to [facility].   5/15: Admitted 5/16: Relatively better, close to baseline  5/16-5/19: Pending SNF bed 5/19: Recurrence of symptoms 5/20: Transferred to [facility], LTM EEG started 5/22: LTM EEG with no events over 48 hours, EEG stopped, clinically appears significantly improved

## 2022-02-04 NOTE — Progress Notes (Signed)
  Progress Note   Patient: Cheyenne Frank [id number] DOB: [birth year] DOA: 02/03/2022     1 DOS: the patient was seen and examined on 02/04/2022        Brief hospital course: Mrs. Naugher is a 81 y.o. F with obesity, dCHF, pAF on Eliquis and amiodarone, IDDM, CKD IIIb baseline 1.2-1.6, OSA noncompliant, COPD, hx seizure no longer on AED, hx SDH s/p burr hole in 2015 who presented with aphasic episodes.     Assessment and Plan: * Seizure disorder ([place]) - Continue Keppra, gabapentin  Acute metabolic encephalopathy Delirium precautions:   -Lights and TV off, minimize interruptions at night  -Blinds open and lights on during day  -Glasses/hearing aid with patient  -Frequent reorientation  -PT/OT when able  -Avoid sedation medications/Beers list medications    Paroxysmal atrial fibrillation (HCC) - Continue apixaban, Coreg   Uncontrolled type 2 diabetes mellitus with hyperglycemia, with long-term current use of insulin (HCC) - Continue SS corrections - Continue glargine - Continue Crestor, aspirin   Stage 3b chronic kidney disease (CKD) (HCC)     Chronic diastolic CHF (congestive heart failure) (HCC) - Continue torsemide  COPD (chronic obstructive pulmonary disease) (HCC)     Essential hypertension - Continue carvedilol, hydralazine, losartan   Sleep apnea - CPAP          Subjective: Still fairly aphasic today.  No new findings.     Physical Exam: Vitals:   02/04/22 1138 02/04/22 1147 02/04/22 1503 02/04/22 1505  BP: (!) 164/66 (!) 154/70 (!) 174/60   Pulse: 74  75   Resp: 16  (!) 22   Temp: 98.8 F (37.1 C)  100.2 F (37.9 C)   TempSrc: Oral  Oral   SpO2: 93%  90% 93%  Weight:       Obese adult female, lying in bed, appears sluggish and weak RRR, no murmurs, no peripheral edema Respiratory rate normal, lungs clear without rales or wheezes Makes eye contact, sometimes, speech is dysarthric at times, at other times is normal       Disposition: Status is: Inpatient         Author: Edwin Dada, MD 02/04/2022 5:03 PM  For on call review [email or web address].

## 2022-02-04 NOTE — Plan of Care (Signed)
  Problem: Self-Concept: Goal: Level of anxiety will decrease Outcome: Progressing Goal: Ability to verbalize feelings about condition will improve Outcome: Progressing   Problem: Safety: Goal: Non-violent Restraint(s) Outcome: Progressing

## 2022-02-04 NOTE — Progress Notes (Signed)
LTM EEG hooked up and running - no initial skin breakdown - push button tested - neuro notified. [facility] monitoring.  

## 2022-02-04 NOTE — Progress Notes (Signed)
Pt declined CPAP at this time.  

## 2022-02-04 NOTE — Progress Notes (Signed)
Patient trying to hit staff, has removed purewick and IV.  Pt. Refuses to cooperate or take PO meds at this time.

## 2022-02-04 NOTE — Progress Notes (Signed)
Subjective: Resting comfortably in bed with daughters at the bedside.   Objective: Current vital signs: BP (!) 171/81 (BP Location: Left Arm)   Pulse 72   Temp 98.9 F (37.2 C) (Oral)   Resp 18   Wt 112.1 kg   SpO2 93%   BMI 39.89 kg/m  Vital signs in last 24 hours: Temp:  [97.9 F (36.6 C)-99.1 F (37.3 C)] 98.9 F (37.2 C) (05/21 0320) Pulse Rate:  [59-76] 72 (05/21 0320) Resp:  [16-22] 18 (05/21 0320) BP: (140-171)/(58-81) 171/81 (05/21 0320) SpO2:  [90 %-96 %] 93 % (05/21 0320) Weight:  [112.1 kg] 112.1 kg (05/21 0500)  Intake/Output from previous day: 05/20 0701 - 05/21 0700 In: 100 [P.O.:100] Out: -  Intake/Output this shift: No intake/output data recorded. Nutritional status:  Diet Order             Diet heart healthy/carb modified Room service appropriate? Yes; Fluid consistency: Thin  Diet effective now                   Gen: Lying in bed comfortably, NAD Lungs: Respirations unlabored.  Ext: Warm and well perfused   Neuro: Ment: Awake and alert with impaired attention. Pleasant, cooperative and able to follow simple commands as well as attempting to answer questions, but speech is sparse and essentially devoid of information content. Appears confused but is not agitated. One instance of garbled/nonsensical speech was noted, otherwise speech was clear. Poor orientation.  CN: EOMI. Fixates on examiner's face normally during portions of the exam. She endorses seeing fingers wiggle in both hemifields. Face is symmetric at rest and when smiling.  Motor: She has mild right upper extremity drift, no drift on the left. Resists examiner with 4/5 strength to BUE. Will wiggle feet to command without gross asymmetry.  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 Cerebellar: No gross ataxia noted.    Lab Results: Results for orders placed or performed during  the hospital encounter of 02/03/22 (from the past 48 hour(s))  Comprehensive metabolic panel     Status: Abnormal   Collection Time: 02/03/22  9:34 PM  Result Value Ref Range   Sodium 137 135 - 145 mmol/L   Potassium 3.9 3.5 - 5.1 mmol/L   Chloride 97 (L) 98 - 111 mmol/L   CO2 31 22 - 32 mmol/L   Glucose, Bld 178 (H) 70 - 99 mg/dL    Comment: Glucose reference range applies only to samples taken after fasting for at least 8 hours.   BUN 25 (H) 8 - 23 mg/dL   Creatinine, Ser 1.61 (H) 0.44 - 1.00 mg/dL   Calcium 9.0 8.9 - 09.6 mg/dL   Total Protein 6.2 (L) 6.5 - 8.1 g/dL   Albumin 2.7 (L) 3.5 - 5.0 g/dL   AST 37 15 - 41 U/L   ALT 41 0 - 44 U/L   Alkaline Phosphatase 104 38 - 126 U/L   Total Bilirubin 0.7 0.3 - 1.2 mg/dL   GFR, Estimated 40 (L) >60 mL/min    Comment: (NOTE) Calculated using the CKD-EPI Creatinine Equation (2021)    Anion gap 9 5 - 15    Comment: Performed at [facility], [address], [place], [place] [address]  Magnesium     Status: None   Collection Time: 02/03/22  9:34 PM  Result Value Ref Range   Magnesium 2.1 1.7 - 2.4  mg/dL    Comment: Performed at [facility], [address] [place], [place] [address]  CBC with Differential/Platelet     Status: None   Collection Time: 02/03/22  9:34 PM  Result Value Ref Range   WBC 6.9 4.0 - 10.5 K/uL   RBC 4.64 3.87 - 5.11 MIL/uL   Hemoglobin 13.8 12.0 - 15.0 g/dL   HCT 42.5 95.6 - 38.7 %   MCV 90.1 80.0 - 100.0 fL   MCH 29.7 26.0 - 34.0 pg   MCHC 33.0 30.0 - 36.0 g/dL   RDW 56.4 33.2 - 95.1 %   Platelets 193 150 - 400 K/uL   nRBC 0.0 0.0 - 0.2 %   Neutrophils Relative % 68 %   Neutro Abs 4.7 1.7 - 7.7 K/uL   Lymphocytes Relative 15 %   Lymphs Abs 1.1 0.7 - 4.0 K/uL   Monocytes Relative 13 %   Monocytes Absolute 0.9 0.1 - 1.0 K/uL   Eosinophils Relative 4 %   Eosinophils Absolute 0.2 0.0 - 0.5 K/uL   Basophils Relative 0 %   Basophils Absolute 0.0 0.0 - 0.1 K/uL   Immature Granulocytes 0 %    Abs Immature Granulocytes 0.03 0.00 - 0.07 K/uL    Comment: Performed at [facility], [address], [place], [place] [address]  Glucose, capillary     Status: Abnormal   Collection Time: 02/03/22  9:50 PM  Result Value Ref Range   Glucose-Capillary 162 (H) 70 - 99 mg/dL    Comment: Glucose reference range applies only to samples taken after fasting for at least 8 hours.   Comment 1 Notify RN    Comment 2 Document in Chart   Glucose, capillary     Status: Abnormal   Collection Time: 02/04/22  6:01 AM  Result Value Ref Range   Glucose-Capillary 180 (H) 70 - 99 mg/dL    Comment: Glucose reference range applies only to samples taken after fasting for at least 8 hours.    Recent Results (from the past 240 hour(s))  Urine Culture     Status: Abnormal   Collection Time: 01/31/22  3:59 PM   Specimen: Urine, Clean Catch  Result Value Ref Range Status   Specimen Description   Final    URINE, CLEAN CATCH Performed at [facility], [address], [place], [place] [address]    Special Requests   Final    NONE Performed at [facility], [address] [place], [place] [address]    Culture >=100,000 COLONIES/mL ESCHERICHIA COLI (A)  Final   Report Status 02/02/2022 FINAL  Final   Organism ID, Bacteria ESCHERICHIA COLI (A)  Final      Susceptibility   Escherichia coli - MIC*    AMPICILLIN 4 SENSITIVE Sensitive     CEFAZOLIN <=4 SENSITIVE Sensitive     CEFEPIME <=0.12 SENSITIVE Sensitive     CEFTRIAXONE <=0.25 SENSITIVE Sensitive     CIPROFLOXACIN <=0.25 SENSITIVE Sensitive     GENTAMICIN <=1 SENSITIVE Sensitive     IMIPENEM <=0.25 SENSITIVE Sensitive     NITROFURANTOIN <=16 SENSITIVE Sensitive     TRIMETH/SULFA <=20 SENSITIVE Sensitive     AMPICILLIN/SULBACTAM <=2 SENSITIVE Sensitive     PIP/TAZO <=4 SENSITIVE Sensitive     * >=100,000 COLONIES/mL ESCHERICHIA COLI    Lipid Panel No results for input(s): CHOL, TRIG, HDL, CHOLHDL, VLDL, LDLCALC in the  last 72 hours.  Studies/Results: CT HEAD WO CONTRAST ( )  Result Date: 02/02/2022 CLINICAL DATA:  Headache, new or worsening (Age >= 50y) EXAM: CT HEAD WITHOUT CONTRAST TECHNIQUE: Contiguous axial images were obtained from the base of the skull through the vertex without intravenous contrast. RADIATION DOSE REDUCTION: This exam was performed according to the departmental dose-optimization program which includes automated exposure control, adjustment of the mA and/or kV according to patient size and/or use of iterative reconstruction technique. COMPARISON:  Brain MRI 01/30/2022, 3 days ago, head CT 01/29/2022 FINDINGS: Brain: Scratched no intracranial hemorrhage, mass effect, or midline shift. No hydrocephalus. The basilar cisterns are patent. Unchanged remote left thalamic infarct. No evidence of territorial infarct or acute ischemia. No extra-axial or intracranial fluid collection. Vascular: Atherosclerosis of skullbase vasculature without hyperdense vessel or abnormal calcification. Skull: No fracture or focal lesion. Remote left-sided craniotomy defect. Sinuses/Orbits: Scattered mucosal thickening of paranasal sinuses, unchanged. No acute findings. Bilateral cataract resection. Other: None. IMPRESSION: 1. No acute intracranial abnormality. 2. Unchanged remote left thalamic infarct. Electronically Signed   By: Narda Rutherford M.D.   On: 02/02/2022 23:49   Overnight EEG with video  Result Date: 02/04/2022 Charlsie Quest, MD     02/04/2022  6:56 AM Patient Name: Cheyenne Frank MRN: [id number] Epilepsy Attending: Charlsie Quest Referring Physician/Provider: Milon Dikes, MD Duration: 02/03/2022 2223 to 02/04/2022 0700 Patient history: 81 year old female who has been having intermittent episodes of difficulty speaking. EEG to evaluate for seizure Level of alertness: Awake, asleep AEDs during EEG study: LEV, GBP Technical aspects: This EEG study was done with scalp electrodes positioned according to the  10-20 International system of electrode placement. Electrical activity was acquired at a sampling rate of  and reviewed with a high frequency filter of  and a low frequency filter of . EEG data were recorded continuously and digitally stored. Description: The posterior dominant rhythm consists of 9 Hz activity of moderate voltage (25-35 uV) seen predominantly in posterior head regions, symmetric and reactive to eye opening and eye closing. Sleep was characterized by vertex waves, sleep spindles (12 to 14 Hz), maximal frontocentral region.  Hyperventilation and photic stimulation were not performed.   IMPRESSION: This study is within normal limits. No seizures or epileptiform discharges were seen throughout the recording. Priyanka Annabelle Harman    Medications: Scheduled:  apixaban  5 mg Oral BID   aspirin EC  81 mg Oral Daily   carvedilol  25 mg Oral BID   cephALEXin  500 mg Oral TID   gabapentin  300 mg Oral BID   insulin aspart  0-15 Units Subcutaneous TID AC & HS   insulin aspart  8 Units Subcutaneous TID WC   insulin glargine-yfgn  35 Units Subcutaneous QHS   levETIRAcetam  1,000 mg Oral BID   losartan  25 mg Oral Daily   multivitamin with minerals  1 tablet Oral Daily   oxybutynin  10 mg Oral Daily   pantoprazole  40 mg Oral Daily   PARoxetine  40 mg Oral QHS   rosuvastatin  2.5 mg Oral QHS   sodium chloride flush  3 mL Intravenous Q12H   torsemide  20 mg Oral Daily   traZODone  50 mg Oral QHS   Continuous:  sodium chloride     lactated ringers     Assessment: 81 year old female who has been having intermittent episodes of difficulty speaking.  Throughout her hospitalization at [facility], there was concern for seizures. She was transferred to [facility] for LTM monitoring.   - We suspect that she has  two things going on, intermittent seizures as well as a waxing/waning delirium.   - [facility] assess for possible clinically intermittent partial nonconvulsive seizures with the aid  of LTM EEG. - On exam this AM she is pleasant, cooperative and able to follow simple commands as well as attempting to answer questions, but speech is sparse and essentially devoid of information content.  - LTM EEG report for this AM (02/03/2022 2223 to 02/04/2022 0700): This study is within normal limits. No seizures or epileptiform discharges were seen throughout the recording.   Recommendations: 1) Continue Keppra 1 g twice daily 2) Continue LTM EEG. If next 24 hours is negative, will most likely discontinue on Monday.    LOS: 1 day   @Electronically  signed: Dr. Caryl PinaEric Lanna Labella@ 02/04/2022  9:18 AM

## 2022-02-04 NOTE — Procedures (Addendum)
Patient Name: SANJUANITA HOFFMEYER  MRN: [id number]  Epilepsy Attending: Lora Havens  Referring Physician/Provider: Amie Portland, MD Duration: 02/03/2022 2223 to 02/04/2022 2223  Patient history: 81 year old female who has been having intermittent episodes of difficulty speaking. EEG to evaluate for seizure  Level of alertness: Awake, asleep  AEDs during EEG study: LEV, GBP  Technical aspects: This EEG study was done with scalp electrodes positioned according to the 10-20 International system of electrode placement. Electrical activity was acquired at a sampling rate of 500Hz  and reviewed with a high frequency filter of 70Hz  and a low frequency filter of 1Hz . EEG data were recorded continuously and digitally stored.   Description: The posterior dominant rhythm consists of 9 Hz activity of moderate voltage (25-35 uV) seen predominantly in posterior head regions, symmetric and reactive to eye opening and eye closing. Sleep was characterized by vertex waves, sleep spindles (12 to 14 Hz), maximal frontocentral region.  Hyperventilation and photic stimulation were not performed.      IMPRESSION: This study is within normal limits. No seizures or epileptiform discharges were seen throughout the recording.  Meliton Samad Barbra Sarks

## 2022-02-04 NOTE — Progress Notes (Signed)
Maint performed. No skin break down.

## 2022-02-04 NOTE — Progress Notes (Signed)
Much calmer since daughters left to go home.  She was very agitated and kept trying to get up and pulling at everything when her daughters were at the bedside. She is still  restless but much calmer and appears to be calming down some.  She did eat 1/2 of her meal for lunch and would not eat anything previously.  Soft wrist restraints remain intact to help keep her from pulling off EEG head leads, as she keeps trying to remove them.

## 2022-02-05 DIAGNOSIS — G9341 Metabolic encephalopathy: Secondary | ICD-10-CM | POA: Diagnosis not present

## 2022-02-05 DIAGNOSIS — G40909 Epilepsy, unspecified, not intractable, without status epilepticus: Secondary | ICD-10-CM | POA: Diagnosis not present

## 2022-02-05 DIAGNOSIS — M961 Postlaminectomy syndrome, not elsewhere classified: Secondary | ICD-10-CM

## 2022-02-05 LAB — GLUCOSE, CAPILLARY
Glucose-Capillary: 166 mg/dL — ABNORMAL HIGH (ref 70–99)
Glucose-Capillary: 248 mg/dL — ABNORMAL HIGH (ref 70–99)
Glucose-Capillary: 201 mg/dL — ABNORMAL HIGH (ref 70–99)
Glucose-Capillary: 72 mg/dL (ref 70–99)

## 2022-02-05 LAB — CBC
HCT: 42.4 % (ref 36.0–46.0)
Hemoglobin: 13.2 g/dL (ref 12.0–15.0)
MCH: 28.9 pg (ref 26.0–34.0)
MCHC: 31.1 g/dL (ref 30.0–36.0)
MCV: 93 fL (ref 80.0–100.0)
Platelets: 185 10*3/uL (ref 150–400)
RBC: 4.56 MIL/uL (ref 3.87–5.11)
RDW: 13.9 % (ref 11.5–15.5)
WBC: 9.3 10*3/uL (ref 4.0–10.5)
nRBC: 0 % (ref 0.0–0.2)

## 2022-02-05 LAB — BASIC METABOLIC PANEL
Anion gap: 8 (ref 5–15)
BUN: 24 mg/dL — ABNORMAL HIGH (ref 8–23)
CO2: 29 mmol/L (ref 22–32)
Calcium: 8.7 mg/dL — ABNORMAL LOW (ref 8.9–10.3)
Chloride: 104 mmol/L (ref 98–111)
Creatinine, Ser: 1.36 mg/dL — ABNORMAL HIGH (ref 0.44–1.00)
GFR, Estimated: 39 mL/min — ABNORMAL LOW (ref >60)
Glucose, Bld: 147 mg/dL — ABNORMAL HIGH (ref 70–99)
Potassium: 3.8 mmol/L (ref 3.5–5.1)
Sodium: 141 mmol/L (ref 135–145)

## 2022-02-05 LAB — AMMONIA: Ammonia: 36 umol/L — ABNORMAL HIGH (ref 9–35)

## 2022-02-05 LAB — VITAMIN B12: Vitamin B-12: 1137 pg/mL — ABNORMAL HIGH (ref 180–914)

## 2022-02-05 MED ORDER — HYDRALAZINE HCL 25 MG PO TABS
25.0000 mg | ORAL_TABLET | Freq: Three times a day (TID) | ORAL | Status: DC
  Administered 2022-02-05 – 2022-02-07 (×5): 25 mg via ORAL
  Filled 2022-02-05 (×6): qty 1

## 2022-02-05 NOTE — Progress Notes (Signed)
LTM EEG discontinued - no skin breakdown at unhook.   

## 2022-02-05 NOTE — Assessment & Plan Note (Signed)
-   Continue Paxil, trazodone

## 2022-02-05 NOTE — Progress Notes (Incomplete)
Subjective: Patient is sleeping in bed, daughter at the bedside. Some delirium and agitation reported by daughter staff  Objective: Current vital signs: BP (!) 157/72 (BP Location: Right Wrist)   Pulse 67   Temp 99.3 F (37.4 C) (Oral)   Resp 20   Wt 113.3 kg   SpO2 94%   BMI 40.32 kg/m  Vital signs in last 24 hours: Temp:  [97.4 F (36.3 C)-99.3 F (37.4 C)] 99.3 F (37.4 C) (05/22 1551) Pulse Rate:  [66-78] 67 (05/22 1551) Resp:  [14-20] 20 (05/22 1551) BP: (141-180)/(65-81) 157/72 (05/22 1551) SpO2:  [94 %-95 %] 94 % (05/22 1551) Weight:  [113.3 kg] 113.3 kg (05/22 0500)  Intake/Output from previous day: 05/21 0701 - 05/22 0700 In: 881.7 [P.O.:360; I.V.:521.7] Out: 700 [Urine:700] Intake/Output this shift: Total I/O In: 60 [P.O.:60] Out: -  Nutritional status:  Diet Order             Diet heart healthy/carb modified Room service appropriate? Yes; Fluid consistency: Thin  Diet effective now                   Gen: Lying in bed comfortably, NAD Lungs: Respirations unlabored.  Ext: Warm and well perfused   Neuro: Ment: Awake to voice. Pleasant, cooperative and able to follow simple commands as well as attempting to answer questions, but speech is sparse and essentially devoid of information content. Appears confused but is not agitated. One instance of garbled/nonsensical speech was noted, otherwise speech was clear. Poor orientation.  CN: EOMI. Fixates on examiner's face normally during portions of the exam. She endorses seeing fingers wiggle in both hemifields. Face is symmetric at rest and when smiling.  Motor: She has mild right upper extremity drift, no drift on the left. Resists examiner with 4/5 strength to BUE. Will wiggle feet to command without gross asymmetry.  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 Cerebellar: No gross ataxia noted.     Lab Results: Results for orders placed or performed during the hospital encounter of 02/03/22 (from the past 48 hour(s))  Comprehensive metabolic panel     Status: Abnormal   Collection Time: 02/03/22  9:34 PM  Result Value Ref Range   Sodium 137 135 - 145 mmol/L   Potassium 3.9 3.5 - 5.1 mmol/L   Chloride 97 (L) 98 - 111 mmol/L   CO2 31 22 - 32 mmol/L   Glucose, Bld 178 (H) 70 - 99 mg/dL    Comment: Glucose reference range applies only to samples taken after fasting for at least 8 hours.   BUN 25 (H) 8 - 23 mg/dL   Creatinine, Ser 1.34 (H) 0.44 - 1.00 mg/dL   Calcium 9.0 8.9 - 10.3 mg/dL   Total Protein 6.2 (L) 6.5 - 8.1 g/dL   Albumin 2.7 (L) 3.5 - 5.0 g/dL   AST 37 15 - 41 U/L   ALT 41 0 - 44 U/L   Alkaline Phosphatase 104 38 - 126 U/L   Total Bilirubin 0.7 0.3 - 1.2 mg/dL   GFR, Estimated 40 (L) >60 mL/min    Comment: (NOTE) Calculated using the CKD-EPI Creatinine Equation (2021)    Anion gap 9 5 - 15    Comment: Performed at [facility], [place] [address], [place], [place] [address]  Magnesium     Status: None   Collection Time: 02/03/22  9:34 PM  Result Value  Ref Range   Magnesium 2.1 1.7 - 2.4 mg/dL    Comment: Performed at [facility] [address], [place], [place] [address]  CBC with Differential/Platelet     Status: None   Collection Time: 02/03/22  9:34 PM  Result Value Ref Range   WBC 6.9 4.0 - 10.5 K/uL   RBC 4.64 3.87 - 5.11 MIL/uL   Hemoglobin 13.8 12.0 - 15.0 g/dL   HCT 41.8 36.0 - 46.0 %   MCV 90.1 80.0 - 100.0 fL   MCH 29.7 26.0 - 34.0 pg   MCHC 33.0 30.0 - 36.0 g/dL   RDW 14.1 11.5 - 15.5 %   Platelets 193 150 - 400 K/uL   nRBC 0.0 0.0 - 0.2 %   Neutrophils Relative % 68 %   Neutro Abs 4.7 1.7 - 7.7 K/uL   Lymphocytes Relative 15 %   Lymphs Abs 1.1 0.7 - 4.0 K/uL   Monocytes Relative 13 %   Monocytes Absolute 0.9 0.1 - 1.0 K/uL   Eosinophils Relative 4 %   Eosinophils Absolute 0.2 0.0 - 0.5 K/uL   Basophils Relative 0 %    Basophils Absolute 0.0 0.0 - 0.1 K/uL   Immature Granulocytes 0 %   Abs Immature Granulocytes 0.03 0.00 - 0.07 K/uL    Comment: Performed at [facility], [address], [place], [place] [address]  Glucose, capillary     Status: Abnormal   Collection Time: 02/03/22  9:50 PM  Result Value Ref Range   Glucose-Capillary 162 (H) 70 - 99 mg/dL    Comment: Glucose reference range applies only to samples taken after fasting for at least 8 hours.   Comment 1 Notify RN    Comment 2 Document in Chart   Glucose, capillary     Status: Abnormal   Collection Time: 02/04/22  6:01 AM  Result Value Ref Range   Glucose-Capillary 180 (H) 70 - 99 mg/dL    Comment: Glucose reference range applies only to samples taken after fasting for at least 8 hours.  Glucose, capillary     Status: Abnormal   Collection Time: 02/04/22 11:34 AM  Result Value Ref Range   Glucose-Capillary 168 (H) 70 - 99 mg/dL    Comment: Glucose reference range applies only to samples taken after fasting for at least 8 hours.  Glucose, capillary     Status: None   Collection Time: 02/04/22  4:31 PM  Result Value Ref Range   Glucose-Capillary 98 70 - 99 mg/dL    Comment: Glucose reference range applies only to samples taken after fasting for at least 8 hours.  Glucose, capillary     Status: None   Collection Time: 02/04/22  9:19 PM  Result Value Ref Range   Glucose-Capillary 71 70 - 99 mg/dL    Comment: Glucose reference range applies only to samples taken after fasting for at least 8 hours.   Comment 1 Notify RN    Comment 2 Document in Chart   Glucose, capillary     Status: None   Collection Time: 02/04/22 10:32 PM  Result Value Ref Range   Glucose-Capillary 76 70 - 99 mg/dL    Comment: Glucose reference range applies only to samples taken after fasting for at least 8 hours.  CBC     Status: None   Collection Time: 02/05/22 12:38 AM  Result Value Ref Range   WBC 9.3 4.0 - 10.5 K/uL   RBC 4.56 3.87 - 5.11  MIL/uL    Hemoglobin 13.2 12.0 - 15.0 g/dL   HCT 42.4 36.0 - 46.0 %   MCV 93.0 80.0 - 100.0 fL   MCH 28.9 26.0 - 34.0 pg   MCHC 31.1 30.0 - 36.0 g/dL   RDW 13.9 11.5 - 15.5 %   Platelets 185 150 - 400 K/uL   nRBC 0.0 0.0 - 0.2 %    Comment: Performed at [facility], [place] [address] [place], [place] [address]  Basic metabolic panel     Status: Abnormal   Collection Time: 02/05/22 12:38 AM  Result Value Ref Range   Sodium 141 135 - 145 mmol/L   Potassium 3.8 3.5 - 5.1 mmol/L   Chloride 104 98 - 111 mmol/L   CO2 29 22 - 32 mmol/L   Glucose, Bld 147 (H) 70 - 99 mg/dL    Comment: Glucose reference range applies only to samples taken after fasting for at least 8 hours.   BUN 24 (H) 8 - 23 mg/dL   Creatinine, Ser 1.36 (H) 0.44 - 1.00 mg/dL   Calcium 8.7 (L) 8.9 - 10.3 mg/dL   GFR, Estimated 39 (L) >60 mL/min    Comment: (NOTE) Calculated using the CKD-EPI Creatinine Equation (2021)    Anion gap 8 5 - 15    Comment: Performed at [facility] [address], [place], [place] [address]  Glucose, capillary     Status: Abnormal   Collection Time: 02/05/22  6:14 AM  Result Value Ref Range   Glucose-Capillary 166 (H) 70 - 99 mg/dL    Comment: Glucose reference range applies only to samples taken after fasting for at least 8 hours.   Comment 1 Notify RN    Comment 2 Document in Chart   Ammonia     Status: Abnormal   Collection Time: 02/05/22  8:37 AM  Result Value Ref Range   Ammonia 36 (H) 9 - 35 umol/L    Comment: Performed at [facility], [place] [address] [place], [place] [address]  Vitamin B12     Status: Abnormal   Collection Time: 02/05/22  8:37 AM  Result Value Ref Range   Vitamin B-12 1,137 (H) 180 - 914 pg/mL    Comment: (NOTE) This assay is not validated for testing neonatal or myeloproliferative syndrome specimens for Vitamin B12 levels. Performed at [facility], [place] [address], [place], [place] [address]   Glucose, capillary     Status: Abnormal    Collection Time: 02/05/22 11:11 AM  Result Value Ref Range   Glucose-Capillary 248 (H) 70 - 99 mg/dL    Comment: Glucose reference range applies only to samples taken after fasting for at least 8 hours.  Glucose, capillary     Status: Abnormal   Collection Time: 02/05/22  4:37 PM  Result Value Ref Range   Glucose-Capillary 201 (H) 70 - 99 mg/dL    Comment: Glucose reference range applies only to samples taken after fasting for at least 8 hours.    Recent Results (from the past 240 hour(s))  Urine Culture     Status: Abnormal   Collection Time: 01/31/22  3:59 PM   Specimen: Urine, Clean Catch  Result Value Ref Range Status   Specimen Description   Final    URINE, CLEAN CATCH Performed at [facility], [address], [place], [place] [address]    Special Requests   Final    NONE Performed at [facility],  [place], [place] [address]    Culture >=100,000 COLONIES/mL ESCHERICHIA COLI (A)  Final   Report Status 02/02/2022 FINAL  Final   Organism ID, Bacteria ESCHERICHIA COLI (A)  Final      Susceptibility   Escherichia coli - MIC*    AMPICILLIN 4 SENSITIVE Sensitive     CEFAZOLIN <=4 SENSITIVE Sensitive     CEFEPIME <=0.12 SENSITIVE Sensitive     CEFTRIAXONE <=0.25 SENSITIVE Sensitive     CIPROFLOXACIN <=0.25 SENSITIVE Sensitive     GENTAMICIN <=1 SENSITIVE Sensitive     IMIPENEM <=0.25 SENSITIVE Sensitive     NITROFURANTOIN <=16 SENSITIVE Sensitive     TRIMETH/SULFA <=20 SENSITIVE Sensitive     AMPICILLIN/SULBACTAM <=2 SENSITIVE Sensitive     PIP/TAZO <=4 SENSITIVE Sensitive     * >=100,000 COLONIES/mL ESCHERICHIA COLI    Lipid Panel No results for input(s): CHOL, TRIG, HDL, CHOLHDL, VLDL, LDLCALC in the last 72 hours.  Studies/Results: Overnight EEG with video  Result Date: 02/04/2022 Lora Havens, MD     02/05/2022  6:04 AM Patient Name: Cheyenne Frank MRN: [id number] Epilepsy Attending: Lora Havens Referring  Physician/Provider: Amie Portland, MD Duration: 02/03/2022 2223 to 02/04/2022 2223 Patient history: 81 year old female who has been having intermittent episodes of difficulty speaking. EEG to evaluate for seizure Level of alertness: Awake, asleep AEDs during EEG study: LEV, GBP Technical aspects: This EEG study was done with scalp electrodes positioned according to the 10-20 International system of electrode placement. Electrical activity was acquired at a sampling rate of 500Hz  and reviewed with a high frequency filter of 70Hz  and a low frequency filter of 1Hz . EEG data were recorded continuously and digitally stored. Description: The posterior dominant rhythm consists of 9 Hz activity of moderate voltage (25-35 uV) seen predominantly in posterior head regions, symmetric and reactive to eye opening and eye closing. Sleep was characterized by vertex waves, sleep spindles (12 to 14 Hz), maximal frontocentral region.  Hyperventilation and photic stimulation were not performed.   IMPRESSION: This study is within normal limits. No seizures or epileptiform discharges were seen throughout the recording. Priyanka Barbra Sarks    Medications: Scheduled:  apixaban  5 mg Oral BID   aspirin EC  81 mg Oral Daily   carvedilol  25 mg Oral BID   gabapentin  300 mg Oral BID   hydrALAZINE  25 mg Oral Q8H   insulin aspart  0-15 Units Subcutaneous TID AC & HS   insulin aspart  8 Units Subcutaneous TID WC   insulin glargine-yfgn  35 Units Subcutaneous QHS   levETIRAcetam  1,000 mg Oral BID   losartan  25 mg Oral Daily   multivitamin with minerals  1 tablet Oral Daily   oxybutynin  10 mg Oral Daily   pantoprazole  40 mg Oral Daily   PARoxetine  40 mg Oral QHS   rosuvastatin  2.5 mg Oral QHS   sodium chloride flush  3 mL Intravenous Q12H   traZODone  50 mg Oral QHS   Continuous:   Assessment: 81 year old female who has been having intermittent episodes of difficulty speaking.  Throughout her hospitalization at [facility],  there was concern for seizures. She was transferred to [facility] for LTM monitoring.   - We suspect that she has two things going on, intermittent seizures as well as a waxing/waning delirium.   - Transferred to [facility] to assess for possible clinically intermittent partial nonconvulsive seizures with the aid of LTM EEG. - On  exam this AM she is pleasant, cooperative and able to follow simple commands as well as attempting to answer questions, but speech is sparse and essentially devoid of information content.  - LTM EEG report for 02/03/2022 2223 to 02/04/2022 0700: This study is within normal limits. No seizures or epileptiform discharges were seen throughout the recording. LTM Report 5/22: This study is within normal limits. No seizures or epileptiform discharges were seen throughout the recording.  Daughter was not sure if previous ASM was given as prophylactic of for actual seizure. Daughter has never witnessed an actual seizure and questions the reason for LEV at this point.   The patient has had several routine and LTM EEGs which did not reveal epileptogenic discharges. MRI studies on 5/10 and 5/19 though motion degraded, did not show epileptic focus.    Recommendations: 1) Discontinue LTM today, remove restraints 2) Continue Keppra 1 g twice daily for now 3 ) If she remains seizure free, repeat EEG in 6 months prior to outpatient follow up at which point outpatient will determine need to wean off Keppra. 4) Please call for questions.    LOS: 2 days   Patient seen and examined by NP/APP with MD. MD to update note as needed.   Janine Ores, DNP, FNP-BC [facility] Pager: [phone]   Attending Attestation:  Patient seen, examined, labs,vitals and notes reviewed. Discussed plan with Charlean Merl, NP and agree with assessment and plan as documented above. I have independently reviewed the chart, obtained history, review of systems and examined the patient.    Electronically signed  by:  Lynnae Sandhoff, MD Page: [phone] 02/06/2022, 8:28 AM

## 2022-02-05 NOTE — Progress Notes (Signed)
Restraints removed.  In good spirits.  No attempts to pull at IV.  More relaxed since restraints removed.  Following commands.  Laughing and even eating her meal with assistance.

## 2022-02-05 NOTE — Assessment & Plan Note (Signed)
Chart notes "opiate sensitivity" during hospitalization in 2015 at [facility]. - Continue gabapentin

## 2022-02-05 NOTE — Procedures (Addendum)
Patient Name: Cheyenne Frank  MRN: [id number]  Epilepsy Attending: Charlsie Quest  Referring Physician/Provider: Milon Dikes, MD Duration: 02/04/2022 2223 to 02/05/2022 1343   Patient history: 81 year old female who has been having intermittent episodes of difficulty speaking. EEG to evaluate for seizure   Level of alertness: Awake, asleep   AEDs during EEG study: LEV, GBP   Technical aspects: This EEG study was done with scalp electrodes positioned according to the 10-20 International system of electrode placement. Electrical activity was acquired at a sampling rate of 500Hz  and reviewed with a high frequency filter of 70Hz  and a low frequency filter of 1Hz . EEG data were recorded continuously and digitally stored.    Description: The posterior dominant rhythm consists of 9 Hz activity of moderate voltage (25-35 uV) seen predominantly in posterior head regions, symmetric and reactive to eye opening and eye closing. Sleep was characterized by vertex waves, sleep spindles (12 to 14 Hz), maximal frontocentral region.  Hyperventilation and photic stimulation were not performed.        IMPRESSION: This study is within normal limits. No seizures or epileptiform discharges were seen throughout the recording.   Valmai Vandenberghe 

## 2022-02-05 NOTE — Progress Notes (Signed)
EEG complete - results pending 

## 2022-02-05 NOTE — Assessment & Plan Note (Addendum)
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  No strokes since.

## 2022-02-05 NOTE — TOC Progression Note (Signed)
Transition of Care [facility]) - Progression Note    Patient Details  Name: Cheyenne Frank MRN: [id number] Date of Birth: [birth year]  [facility] CM/SW Contact  Baldemar Lenis, [place] Phone Number: 02/05/2022, 10:37 AM  Clinical Narrative:   CSW noting per chart review plan to admit to [facility] at discharge. CSW spoke with Admissions at [facility], and they are now unsure about patient admission due to her aggressive behavior and requiring restraints. [facility] will need to see patient be more appropriate for a couple of days before agreeing to admit at discharge. CSW to follow.    Expected Discharge Plan: Skilled Nursing Facility Barriers to Discharge: Continued Medical Work up, Requiring sitter/restraints  Expected Discharge Plan and Services Expected Discharge Plan: Skilled Nursing Facility                                               Social Determinants of Health (SDOH) Interventions    Readmission Risk Interventions    01/28/2022    2:28 PM  Readmission Risk Prevention Plan  Transportation Screening Complete  PCP or Specialist Appt within 3-5 Days Complete  HRI or Home Care Consult Complete  Social Work Consult for Recovery Care Planning/Counseling Complete  Palliative Care Screening Complete  Medication Review [employer]) Complete

## 2022-02-05 NOTE — Progress Notes (Signed)
Progress Note   Patient: Cheyenne Frank [id number] DOB: Jun 14, 1941 DOA: 02/03/2022     2 DOS: the patient was seen and examined on 02/05/2022        Brief hospital course: Mrs. Fontenot is an 81 y.o. F with obesity, dCHF, pAF on Eliquis and amiodarone, IDDM, CKD IIIb baseline 1.2-1.6, OSA noncompliant, COPD, hx seizure no longer on AED, chronic back pain and post-laminectomy syndrome, hx SDH s/p burr hole in 2015 who presented with aphasic episodes.   At baseline, patient evidently has spells of difficulty speaking 1-2 times per week, usually associated with hypo- or hyperglycemia (has about 2 lows in the 40s per month, and slightly more frequent highs over the readable on her machine).  Also potentially a correlation with "stress".  Also noted a progressive decline in mother's overall cognition and function, worsening incontinence over months.  She was admitted at [facility] first, seen by Neurology who felt that her condition was possibly focal seizures but more likely recrudescence of old deficits in setting of hypertension, as well as possibly a component of delirium.  The episodes seemed to clear, so she was being prepared for discharge to SNF when she had a low blood sugar in the 40s on 5/16 (hospital day 4) and had recurrence of symptoms.    Subsequently, consideration of focal seizures was thought more urgent to rule out, so she was [facility] to [facility].   5/15: Admitted 5/16: Relatively better, close to baseline  5/16-5/19: Pending SNF bed 5/19: Recurrence of symptoms 5/20: Transferred to [facility], LTM EEG started 5/22: LTM EEG with no events over 48 hours, EEG stopped, clinically appears significantly improved        Assessment and Plan: * Acute metabolic encephalopathy At baseline the patient is oriented and participates in cares.  Maybe has some mild memory loss, and has mild aphasia at times as a result of her prior stroke and subdural hemorrhage.  Here she was initially brought  in for what appeared to be slurred speech and worse aphasia, evaluated by neurology who felt this was most likely recrudescence of symptoms in the setting of hypoglycemia or hypertension but with the small possibility that it was focal seizures.   While she initially improved here in the hospital, she had some further episodes of hypoglycemia and her aphasia and disorientation returned worse than previous so she was transferred to [facility] for long-term EEG monitoring to capture any possible focal seizures.  She has not had 48 hours of EEG monitoring and 0 seizures have been observed.  I think it is exceedingly unlikely that that is the case, and agree that most likely this is recrudescence of symptoms, as well as anxiety and poor ability to compensate for aphasia when she is stressed  Her encephalopathy seems better today, she is pleasant and participating in feeding Delirium precautions:   -Lights and TV off, minimize interruptions at night  -Blinds open and lights on during day  -Glasses/hearing aid with patient  -Frequent reorientation  -PT/OT when able  -Avoid sedation medications/Beers list medications    Paroxysmal atrial fibrillation ([place]) - Continue apixaban, Coreg   Uncontrolled type 2 diabetes mellitus with hyperglycemia, with long-term current use of insulin (HCC) - Continue SS corrections - Continue glargine - Continue Crestor, aspirin   Stage 3b chronic kidney disease (CKD) (HCC) Cr stable 1.3 relative to baseline    Chronic diastolic CHF (congestive heart failure) ([place]) Appears euvolemic.  Torsemide held given poor PO intake. - Resume torsemide tomorrow  COPD (chronic obstructive pulmonary disease) (HCC) No wheezing - Continue PRN albuterol    Postlaminectomy syndrome Chart notes "opiate sensitivity" during hospitalization in 2015 at [facility]. 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  No strokes since.  Depression - Continue Paxil, trazodone  Sleep apnea - CPAP  Essential hypertension - Continue carvedilol, hydralazine, losartan           Subjective: Patient is more pleasant this afternoon, interactive, appropriate.  Speaking clearly to me brief sentences.     Physical Exam: Vitals:   02/05/22 0753 02/05/22 0755 02/05/22 1116 02/05/22 1551  BP: (!) 180/65 (!) 147/66 (!) 170/67 (!) 157/72  Pulse: 68 78 78 67  Resp: 14  20 20   Temp: 97.8 F (36.6 C)  98 F (36.7 C) 99.3 F (37.4 C)  TempSrc: Oral  Axillary Oral  SpO2:    94%  Weight:       Elderly adult female, lying in bed, makes eye contact RRR, no murmurs, no peripheral edema Respiratory rate normal, lungs clear without rales or wheezes Abdomen soft without tenderness palpation or guarding, no ascites or distention Extraocular movements intact, face symmetric, speech fluent, slight psychomotor slowing, she is oriented to person, not to place or time.  Follows commands.     Data Reviewed: Nursing notes reviewed, vital signs reviewed Basic metabolic panel reviewed, glucose reviewed Complete blood count reviewed Ammonia and B12 pending  Family Communication: Daughter by phone    Disposition: Status is: Inpatient         Author: Edwin Dada, MD 02/05/2022 7:06 PM  For on call review [email or web address].

## 2022-02-05 NOTE — Progress Notes (Signed)
Pt refused CPAP. RT will cont to monitor as needed.  

## 2022-02-06 LAB — CBC
HCT: 40 % (ref 36.0–46.0)
Hemoglobin: 13.1 g/dL (ref 12.0–15.0)
MCH: 29.8 pg (ref 26.0–34.0)
MCHC: 32.8 g/dL (ref 30.0–36.0)
MCV: 90.9 fL (ref 80.0–100.0)
Platelets: 172 10*3/uL (ref 150–400)
RBC: 4.4 MIL/uL (ref 3.87–5.11)
RDW: 13.9 % (ref 11.5–15.5)
WBC: 8.2 10*3/uL (ref 4.0–10.5)
nRBC: 0 % (ref 0.0–0.2)

## 2022-02-06 LAB — GLUCOSE, CAPILLARY
Glucose-Capillary: 112 mg/dL — ABNORMAL HIGH (ref 70–99)
Glucose-Capillary: 144 mg/dL — ABNORMAL HIGH (ref 70–99)
Glucose-Capillary: 180 mg/dL — ABNORMAL HIGH (ref 70–99)
Glucose-Capillary: 127 mg/dL — ABNORMAL HIGH (ref 70–99)

## 2022-02-06 LAB — BASIC METABOLIC PANEL
Anion gap: 6 (ref 5–15)
BUN: 24 mg/dL — ABNORMAL HIGH (ref 8–23)
CO2: 30 mmol/L (ref 22–32)
Calcium: 8.8 mg/dL — ABNORMAL LOW (ref 8.9–10.3)
Chloride: 106 mmol/L (ref 98–111)
Creatinine, Ser: 1.23 mg/dL — ABNORMAL HIGH (ref 0.44–1.00)
GFR, Estimated: 44 mL/min — ABNORMAL LOW (ref >60)
Glucose, Bld: 99 mg/dL (ref 70–99)
Potassium: 3.9 mmol/L (ref 3.5–5.1)
Sodium: 142 mmol/L (ref 135–145)

## 2022-02-06 MED ORDER — DEXTROSE 50 % IV SOLN: 25.0000 g | INTRAVENOUS | Status: DC | PRN

## 2022-02-06 MED ORDER — TORSEMIDE 20 MG PO TABS
20.0000 mg | ORAL_TABLET | Freq: Every day | ORAL | Status: DC
Start: 2022-02-06 — End: 2022-02-06

## 2022-02-06 MED ORDER — LOSARTAN POTASSIUM 50 MG PO TABS
50.0000 mg | ORAL_TABLET | Freq: Every day | ORAL | Status: DC
  Administered 2022-02-07: 50 mg via ORAL
  Filled 2022-02-06: qty 1

## 2022-02-06 MED ORDER — TORSEMIDE 20 MG PO TABS
20.0000 mg | ORAL_TABLET | Freq: Every day | ORAL | Status: DC
  Administered 2022-02-07: 20 mg via ORAL
  Filled 2022-02-06: qty 1

## 2022-02-06 NOTE — Evaluation (Addendum)
Physical Therapy Evaluation Patient Details Name: Cheyenne Frank MRN: [id number] DOB: 06-07-1941 Today's Date: 02/06/2022  History of Present Illness  Pt is an 81 y/o F after presenting with worsening aphasia, similar to prior strokes. Pt was outside of tPA window. Pt noted to have elevated BP, CT & MRI was negative for acute processes. Pt transferred to [facility] on 5/20 for further monitoring, LTM EEG showing no events after 48 hours. PMH includes paroxysmal a-fib on eliquis, stroke, HTN, insulin dependent DM2, sleep apnea, CKD 3B, COPD, diastolic CHF, fibromyalgia, anxiety, macular degeneration  Clinical Impression  Pt admitted with/for s/s of stroke stated above and some limitations described below.  This was a limited evaluation due to pt not feeling like doing any therapy this afternoon.  Pt needing min assist for bed mobility, but declined to get to EOB or stand/mobilized OOB.  Pt currently limited functionally due to the problems listed. ( See problems list.)   Pt will benefit from PT to maximize function and safety in order to get ready for next venue listed below.        Recommendations for follow up therapy are one component of a multi-disciplinary discharge planning process, led by the attending physician.  Recommendations may be updated based on patient status, additional functional criteria and insurance authorization.  Follow Up Recommendations Skilled nursing-short term rehab (<3 hours/day)    Assistance Recommended at Discharge Frequent or constant Supervision/Assistance  Patient can return home with the following  A lot of help with walking and/or transfers;A lot of help with bathing/dressing/bathroom;Assistance with feeding;Assistance with cooking/housework;Assist for transportation;Direct supervision/assist for financial management;Direct supervision/assist for medications management;Help with stairs or ramp for entrance    Equipment Recommendations None recommended by PT   Recommendations for Other Services       Functional Status Assessment Patient has had a recent decline in their functional status and demonstrates the ability to make significant improvements in function in a reasonable and predictable amount of time.     Precautions / Restrictions Precautions Precautions: Fall Restrictions Weight Bearing Restrictions: No      Mobility  Bed Mobility Overal bed mobility: Needs Assistance Bed Mobility: Supine to Sit, Rolling Rolling: Min guard, Min assist         General bed mobility comments: pt was very resistent to getting up, min assist starting to EOB and then pt flopped back down.    Transfers                   General transfer comment: pt refused to get to EOB or stand up OOB.    Ambulation/Gait                  Stairs            Wheelchair Mobility    Modified Rankin (Stroke Patients Only)       Balance Overall balance assessment: Needs assistance Sitting-balance support: No upper extremity supported, Feet supported, Single extremity supported Sitting balance-Leahy Scale: Fair                                       Pertinent Vitals/Pain Pain Assessment Pain Assessment: Faces Faces Pain Scale: No hurt Pain Intervention(s): Monitored during session    Home Living Family/patient expects to be discharged to:: Private residence Living Arrangements: Children Available Help at Discharge: Family Type of Home: House Home Access: Stairs to enter Entrance  Stairs-Rails: Right Entrance Stairs-Number of Steps: 5   Home Layout: Two level;Able to live on main level with bedroom/bathroom Home Equipment: Rolling Walker (2 wheels);Wheelchair - manual;BSC/3in1;Shower seat - built in;Other (comment) Additional Comments: Lives with daughter and son-in-law. Daughter has MS - is on disability so present 24/7 however only able to provide moderate levels of assist; info obtained per chart review from  initial admission on 5/10.    Prior Function Prior Level of Function : Needs assist             Mobility Comments: per chart review, RW, pt stating w/c although poor historian ADLs Comments: reports recieving assistance for ADLs, unabel to further elaborate, per chart review assist for tub transfers, LB ADLs     Hand Dominance        Extremity/Trunk Assessment   Upper Extremity Assessment Upper Extremity Assessment: Generalized weakness    Lower Extremity Assessment Lower Extremity Assessment: Generalized weakness (R mild weaker and less coordinated)    Cervical / Trunk Assessment Cervical / Trunk Assessment: Normal  Communication   Communication: HOH  Cognition Arousal/Alertness: Lethargic, Awake/alert Behavior During Therapy: Restless, WFL for tasks assessed/performed Overall Cognitive Status: Impaired/Different from baseline Area of Impairment: Orientation, Problem solving, Attention, Following commands, Safety/judgement, Awareness, Memory                 Orientation Level: Disoriented to, Place, Time, Situation Current Attention Level: Focused Memory: Decreased short-term memory Following Commands: Follows one step commands inconsistently, Follows one step commands with increased time Safety/Judgement: Decreased awareness of safety, Decreased awareness of deficits Awareness: Intellectual Problem Solving: Slow processing, Requires verbal cues, Decreased initiation, Difficulty sequencing, Requires tactile cues          General Comments General comments (skin integrity, edema, etc.): vss during limited evaluation    Exercises     Assessment/Plan    PT Assessment Patient needs continued PT services  PT Problem List Decreased strength;Decreased activity tolerance;Decreased balance;Decreased mobility;Decreased knowledge of use of DME       PT Treatment Interventions DME instruction;Gait training;Functional mobility training;Therapeutic  activities;Balance training;Patient/family education    PT Goals (Current goals can be found in the Care Plan section)  Acute Rehab PT Goals Patient Stated Goal: I want to go home...today PT Goal Formulation: Patient unable to participate in goal setting Time For Goal Achievement: 02/20/22 Potential to Achieve Goals: Fair    Frequency Min 3X/week     Co-evaluation               AM-PAC PT "6 Clicks" Mobility  Outcome Measure Help needed turning from your back to your side while in a flat bed without using bedrails?: A Little Help needed moving from lying on your back to sitting on the side of a flat bed without using bedrails?: A Little Help needed moving to and from a bed to a chair (including a wheelchair)?: A Little Help needed standing up from a chair using your arms (e.g., wheelchair or bedside chair)?: A Little Help needed to walk in hospital room?: A Lot Help needed climbing 3-5 steps with a railing? : A Lot 6 Click Score: 16    End of Session   Activity Tolerance: Patient tolerated treatment well Patient left: in bed;with call bell/phone within reach;with bed alarm set Nurse Communication: Mobility status PT Visit Diagnosis: Muscle weakness (generalized) (M62.81);Other abnormalities of gait and mobility (R26.89)    Time: [id number] PT Time Calculation (min) (ACUTE ONLY): 13 min   Charges:   PT  Evaluation $PT Eval Moderate Complexity: 1 Mod          02/06/2022  Jacinto Halim., PT Acute Rehabilitation Services [phone]  (pager) [phone]  (office)02/06/2022  Jacinto Halim., PT Acute Rehabilitation Services [phone]  (pager) [phone]  (office)  Eliseo Gum Eduin Friedel 02/06/2022, 4:50 PM

## 2022-02-06 NOTE — Care Management Important Message (Signed)
Important Message  Patient Details  Name: MADA SADIK MRN: [id number] Date of Birth: 1941/06/15   Medicare Important Message Given:  Yes     Chan Rosasco Stefan Church 02/06/2022, 3:19 PM

## 2022-02-06 NOTE — Progress Notes (Signed)
Patient refused night medication after explaining reason for giving. Patient pushed pills away and pleasantly said "I just want to rest".   Alerted Dow Adolph, DO about patient HR of 64 and Carvedilol dose. Told to hold. Also alerted about CBG of 72 and patient has long acting insulin order. Told to hold that medication as well.

## 2022-02-06 NOTE — Progress Notes (Signed)
Speech Language Pathology Treatment: Dysphagia  Patient Details Name: Cheyenne Frank MRN: [id number] DOB: 1940-10-03 Today's Date: 02/06/2022 Time: 1040-1100 SLP Time Calculation (min) (ACUTE ONLY): 20 min  Assessment / Plan / Recommendation Clinical Impression  Patient seen by SLP with daughter present in room for skilled treatment session focused on dysphagia goals. When SLP entered room, RN preparing her medications and daughter feeding patient breakfast. Overall, patient appears slightly more attentive as compared to previous date, but she continues to be easily distracted by people, sounds, things/objects within her reach. She exhibits prolonged mastication and instances of brief oral holding which appears to be cognitive-based and related to her significantly decreased attention. SLP asked daughter how close to baseline patient was and daughter reported that she is significantly changed from her baseline and although she did have h/o cognitive-linguistic impairments from prior CVA, she was able to complete functional tasks such as feeding self and was able to communicate her wants/needs effectively. Patient refused to take her medications crushed in pudding and SLP then recommended changing to medications whole in puree/pudding. SLP is recommending continued acute level intervention as well as skilled SLP treatment at next venue of care. SLP to consider seeking cognitive linguistic evaluation but this may be more beneficial at next venue of care as well.     HPI HPI: Patient is an 81 y.o. female with PMH: paroxysmal atrial fibrillation on Eliquis, prior stroke, hypertension, insulin-dependent diabetes mellitus type 2, sleep apnea, chronic kidney disease stage IIIb, COPD, diastolic congestive heart failure (Echo 10/2021 EF 60-65%) and history of left-sided SDH who presented to [facility] emergency department with aphasia on 5/10. Stroke workup including MRI of brain and MRA both were negative for CVA.  Patient's mentation continued to wax and wane and UA was suggestive of UTI with E-coli. Patient was started on 5 day course of antibiotics but no improvement in mentation. Neurology felt patient's symptoms were possibly secondary to underlying seizure and Keppra initiated but still no improvement in mentation. She exhibited episode of sudden onset significant aphasia and headaches 5/19 in evening and stat CT ordered but results were unremarkable. Patient will undergo continuous EEG monitoring and discharge to [facility] on Monday when work-up complete.      SLP Plan  Continue with current plan of care      Recommendations for follow up therapy are one component of a multi-disciplinary discharge planning process, led by the attending physician.  Recommendations may be updated based on patient status, additional functional criteria and insurance authorization.    Recommendations  Diet recommendations: Regular;Thin liquid Liquids provided via: Cup;Straw Medication Administration: Whole meds with puree Supervision: Full supervision/cueing for compensatory strategies;Trained caregiver to feed patient Compensations: Minimize environmental distractions;Small sips/bites;Slow rate;Lingual sweep for clearance of pocketing Postural Changes and/or Swallow Maneuvers: Seated upright 90 degrees                Oral Care Recommendations: Oral care BID;Staff/trained caregiver to provide oral care Follow Up Recommendations: Skilled nursing-short term rehab (<3 hours/day) Assistance recommended at discharge: Frequent or constant Supervision/Assistance SLP Visit Diagnosis: Dysphagia, unspecified (R13.10) Plan: Continue with current plan of care         Sonia Baller, MA, CCC-SLP Speech Therapy

## 2022-02-06 NOTE — Plan of Care (Addendum)
  Problem: Education: Goal: Expressions of having a comfortable level of knowledge regarding the disease process will increase Outcome: Progressing   Problem: Coping: Goal: Ability to adjust to condition or change in health will improve Outcome: Progressing Goal: Ability to identify appropriate support needs will improve Outcome: Progressing   Problem: Health Behavior/Discharge Planning: Goal: Compliance with prescribed medication regimen will improve Outcome: Progressing   Problem: Medication: Goal: Risk for medication side effects will decrease Outcome: Progressing   Problem: Clinical Measurements: Goal: Complications related to the disease process, condition or treatment will be avoided or minimized Outcome: Progressing Goal: Diagnostic test results will improve Outcome: Progressing   Problem: Safety: Goal: Verbalization of understanding the information provided will improve Outcome: Progressing   Problem: Self-Concept: Goal: Level of anxiety will decrease Outcome: Progressing Goal: Ability to verbalize feelings about condition will improve Outcome: Progressing   Problem: Safety: Goal: Non-violent Restraint(s) Outcome: Progressing   Problem: Education: Goal: Knowledge of General Education information will improve Description: Including pain rating scale, medication(s)/side effects and non-pharmacologic comfort measures Outcome: Progressing   Problem: Health Behavior/Discharge Planning: Goal: Ability to manage health-related needs will improve Outcome: Progressing   Problem: Clinical Measurements: Goal: Ability to maintain clinical measurements within normal limits will improve Outcome: Progressing Goal: Will remain free from infection Outcome: Progressing Goal: Diagnostic test results will improve Outcome: Progressing Goal: Respiratory complications will improve Outcome: Progressing Goal: Cardiovascular complication will be avoided Outcome: Progressing    Problem: Activity: Goal: Risk for activity intolerance will decrease Outcome: Progressing   Problem: Nutrition: Goal: Adequate nutrition will be maintained Outcome: Progressing   Problem: Coping: Goal: Level of anxiety will decrease Outcome: Progressing   Problem: Elimination: Goal: Will not experience complications related to bowel motility Outcome: Progressing Goal: Will not experience complications related to urinary retention Outcome: Progressing   Problem: Pain Managment: Goal: General experience of comfort will improve Outcome: Progressing   Problem: Safety: Goal: Ability to remain free from injury will improve Outcome: Progressing   Problem: Skin Integrity: Goal: Risk for impaired skin integrity will decrease Outcome: Progressing   1600 Patient began to scream out "I want to go home."   1715 Patient is calm and requested apple juice. She drank 120 mL of juice.

## 2022-02-06 NOTE — Progress Notes (Signed)
Progress Note   Patient: Cheyenne Frank [id number] DOB: [birth year] DOA: 02/03/2022     3 DOS: the patient was seen and examined on 02/06/2022        Brief hospital course: Mrs. Tamargo is an 81 y.o. 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   At baseline, patient evidently has spells of difficulty speaking 1-2 times per week, usually associated with hypo- or hyperglycemia (has about 2 lows in the 40s per month, and slightly more frequent highs over the readable on her machine).  Also potentially a correlation with "stress".  Also noted a progressive decline in mother's overall cognition and function, worsening incontinence over months.  She was admitted at [facility] first, seen by Neurology who felt that her condition was possibly focal seizures but more likely recrudescence of old deficits in setting of hypertension, as well as possibly a component of delirium.  The episodes seemed to clear, so she was being prepared for discharge to SNF when she had a low blood sugar in the 40s on 5/16 (hospital day 4) and had recurrence of symptoms.    Subsequently, consideration of focal seizures was thought more urgent to rule out, so she was transferred to [facility].   5/15: Admitted 5/16: Relatively better, close to baseline  5/16-5/19: Pending SNF bed 5/19: Recurrence of "aphasia" after episode of hypoglycemia 5/20: Transferred to [facility], LTM EEG started 5/22: LTM EEG with no events over 48 hours, EEG stopped, clinically appears improved        Assessment and Plan: * Acute metabolic encephalopathy See much longer summary from 5/22  Better again today, still encephalopathic from baseline but following commands, talking. -  PT/OT     History of CVA (cerebrovascular accident) Had subdural hemorrhage requiring burrhole and left thalamic stroke in the setting of postoperative sepsis from an infected surgical wound after back surgery in 2015.  No strokes since. At baseline, walks with a walker.   - Continue aspirin, Eliquis, Crestor  Depression - Continue Paxil, trazodone   Postlaminectomy syndrome Chronic pain syndrome Chart notes "opiate sensitivity" during hospitalization in 2015 at [facility]. - Continue gabapentin  Paroxysmal atrial fibrillation (HCC) - Continue apixaban, Coreg  Uncontrolled type 2 diabetes mellitus with hyperglycemia, with long-term current use of insulin (HCC) Glucose controlled - Continue SS corrections - Continue glargine - Continue Crestor, aspirin  Stage 3b chronic kidney disease (CKD) (HCC) Cr stable 1.3 relative to baseline   Chronic diastolic CHF (congestive heart failure) (HCC) Appears euvolemic.    - Resume torsemide   COPD (chronic obstructive pulmonary disease) (HCC) No wheezing - Continue PRN albuterol   Seizure disorder (HCC) - Continue Keppra, gabapentin  Sleep apnea - CPAP  Essential hypertension BP somewhat elevated - Continue carvedilol, hydralazine - Increase losartan - Resume torsemide  Morbid obesity BMI 40.3         Subjective: Patient has no current pain complaint, mentation seems slightly better.  She is eager to sit up.     Physical Exam: Vitals:   02/06/22 0357 02/06/22 0733 02/06/22 1100 02/06/22 1139  BP: 106/88 (!) 176/65 (!) 176/62 (!) 166/62  Pulse: 70 72 71 79  Resp: 20 20 18 18   Temp: 98.6 F (37 C) 98.8 F (37.1 C) (!)  97.5 F (36.4 C) 97.6 F (36.4 C)  TempSrc: Oral Oral Axillary Axillary  SpO2: 97% 94%  93%  Weight:       Obese adult female, lying in bed, appears sluggish tired.  Responses are somewhat slowed RRR, no murmurs, no peripheral edema Respiratory normal, lungs clear without rales or  wheezes Abdomen soft without tenderness palpation or guarding Attention normal, affect blunted, psychomotor slowing noted, oriented to self, daughter, hospital, but short-term memory seems impaired      Data Reviewed: Nursing notes reviewed, vital signs reviewed, basic metabolic panel reviewed  Family Communication: Daughter at the bedside    Disposition: Status is: Inpatient Remains inpatient appropriate because: The patient was admitted for altered mental status.  This seems to have resolved, appears to be simply some delirium, likely recrudescence of some old deficits from prior strokes and subdural hemorrhages in the setting of hypoglycemia and hypertension.  Now is medically stable, but will need significant rehab to return to her prior level of function, medically ready for d/c when bed available           Author: Edwin Dada, MD 02/06/2022 5:59 PM  For on call review [email or web address].

## 2022-02-06 NOTE — Progress Notes (Addendum)
1100 Patient's daughter at bedside. Patient had refused to eat breakfast at 0800. Patient ate 15% of breakfast tray. Pills crushed and mixed with vanilla pudding. Patient ate 1 spoonful of medicine mixed vanilla pudding. SBP was increased at 176.   1400 Patient sat in the chair for 5 minutes but she was moved back to the bed due to instability in sitting position. Patient took hydralazine pill whole with water.

## 2022-02-06 NOTE — TOC Progression Note (Signed)
Transition of Care [facility]) - Progression Note    Patient Details  Name: Cheyenne Frank MRN: [id number] Date of Birth: 12/11/1940  Transition of Care [facility]) CM/SW [place], [place] Phone Number: 02/06/2022, 11:57 AM  Clinical Narrative:   CSW spoke with Admissions at [facility], as patient is now out of restraints and doing much better, as the EEG was the only trigger for the patient being upset and now that the EEG has been removed, patient is calm, cooperative, and pleasant. [facility] DON is now refusing to admit the patient, however, and they are rescinding their bed offer. CSW met with patient's daughter at bedside to discuss, and she asked for time to discuss other options with her sister. CSW received a call back that they would like to consider [facility] instead. CSW sent referral, awaiting response. CSW to follow.    Expected Discharge Plan: Skilled Nursing Facility Barriers to Discharge: Continued Medical Work up, Requiring sitter/restraints  Expected Discharge Plan and Services Expected Discharge Plan: [place]                                               Social Determinants of Health (SDOH) Interventions    Readmission Risk Interventions    01/28/2022    2:28 PM  Readmission Risk Prevention Plan  Transportation Screening Complete  PCP or Specialist Appt within 3-5 Days Complete  HRI or [place] Complete  Social Work Consult for [place] Planning/Counseling Complete  Palliative Care Screening Complete  Medication Review [employer] photographer) Complete

## 2022-02-06 NOTE — Evaluation (Addendum)
Occupational Therapy Evaluation Patient Details Name: Cheyenne Frank MRN: [id number] DOB: March 26, 1941 Today's Date: 02/06/2022   History of Present Illness Pt is an 81 y/o F after presenting with worsening aphasia, similar to prior strokes. Pt was outside of tPA window. Pt noted to have elevated BP, CT & MRI was negative for acute processes. Pt transferred to [facility] on 5/20 for further monitoring, LTM EEG showing no events after 48 hours. PMH includes paroxysmal a-fib on eliquis, stroke, HTN, insulin dependent DM2, sleep apnea, CKD 3B, COPD, diastolic CHF, fibromyalgia, anxiety, macular degeneration   Clinical Impression   Pt poor historian, per chart review lives with children, required assistance for LB ADLs, bathing and used RW for mobility, however pt reports using w/c for mobility as well. Pt oriented to self only this session, requiring increased cuing to follow commands during session. Pt min guard-max A for ADLs, min A for bed mobility and transfers with RW. Pt abandoning RW and holding onto bed rails/therapist when taking steps forward, requiring max cues for redirection. Pt declining sitting up in chair, wanting to leave attempting to stand up EOB without support. Pt presenting with impairments listed below, will follow acutely. Recommend SNF at d/c.     Recommendations for follow up therapy are one component of a multi-disciplinary discharge planning process, led by the attending physician.  Recommendations may be updated based on patient status, additional functional criteria and insurance authorization.   Follow Up Recommendations  Skilled nursing-short term rehab (<3 hours/day)    Assistance Recommended at Discharge Frequent or constant Supervision/Assistance  Patient can return home with the following Assistance with cooking/housework;Assist for transportation;Help with stairs or ramp for entrance;A lot of help with bathing/dressing/bathroom;A lot of help with walking and/or  transfers;Direct supervision/assist for medications management    Functional Status Assessment  Patient has had a recent decline in their functional status and demonstrates the ability to make significant improvements in function in a reasonable and predictable amount of time.  Equipment Recommendations  None recommended by OT;Other (comment) (defer to next venue of care)    Recommendations for Other Services PT consult     Precautions / Restrictions Precautions Precautions: Fall Restrictions Weight Bearing Restrictions: No      Mobility Bed Mobility Overal bed mobility: Needs Assistance Bed Mobility: Supine to Sit     Supine to sit: Min assist Sit to supine: Min assist   General bed mobility comments: increased time, step by  step cues as pt with difficulty sequencing steps to sit EOB    Transfers Overall transfer level: Needs assistance Equipment used: Rolling walker (2 wheels) Transfers: Sit to/from Stand Sit to Stand: Min assist           General transfer comment: max cues to not abandon RW with standing/taking steps at bedside      Balance Overall balance assessment: Needs assistance Sitting-balance support: No upper extremity supported, Feet supported, Single extremity supported Sitting balance-Leahy Scale: Fair Sitting balance - Comments: +use of bedrail in sitting   Standing balance support: Bilateral upper extremity supported, Reliant on assistive device for balance Standing balance-Leahy Scale: Poor Standing balance comment: reliant on external support of RW/bedrails/therapist                           ADL either performed or assessed with clinical judgement   ADL Overall ADL's : Needs assistance/impaired Eating/Feeding: Sitting;Supervision/ safety   Grooming: Sitting;Wash/dry face;Set up;Supervision/safety Grooming Details (indicate cue type and  reason): washed face sitting EOB         Upper Body Dressing : Min  guard;Sitting Upper Body Dressing Details (indicate cue type and reason): to don clean gown Lower Body Dressing: Maximal assistance;Sitting/lateral leans Lower Body Dressing Details (indicate cue type and reason): to don socks Toilet Transfer: Minimal assistance;Rolling walker (2 wheels) Toilet Transfer Details (indicate cue type and reason): simulated stepping in room, pt declining chair Toileting- Clothing Manipulation and Hygiene: Maximal assistance;Sitting/lateral lean Toileting - Clothing Manipulation Details (indicate cue type and reason): to doff undergarments     Functional mobility during ADLs: Minimal assistance;Rolling walker (2 wheels)       Vision   Vision Assessment?: No apparent visual deficits     Perception     Praxis      Pertinent Vitals/Pain       Hand Dominance     Extremity/Trunk Assessment         Cervical / Trunk Assessment Cervical / Trunk Assessment: Normal   Communication Communication Communication: HOH   Cognition Arousal/Alertness: Lethargic Behavior During Therapy: WFL for tasks assessed/performed Overall Cognitive Status: Impaired/Different from baseline Area of Impairment: Orientation, Problem solving, Attention, Following commands, Safety/judgement, Awareness, Memory                 Orientation Level: Disoriented to, Place, Time, Situation Current Attention Level: Focused Memory: Decreased short-term memory Following Commands: Follows one step commands inconsistently, Follows one step commands with increased time Safety/Judgement: Decreased awareness of safety, Decreased awareness of deficits Awareness: Intellectual Problem Solving: Slow processing, Requires verbal cues, Decreased initiation, Difficulty sequencing, Requires tactile cues General Comments: delayed response to questions, requiring cues for safety, can be impulsive     General Comments  VSS on RA    Exercises     Shoulder Instructions      Home Living  Family/patient expects to be discharged to:: Private residence Living Arrangements: Children Available Help at Discharge: Family Type of Home: House Home Access: Stairs to enter Technical brewer of Steps: 5 Entrance Stairs-Rails: Right Home Layout: Two level;Able to live on main level with bedroom/bathroom     Bathroom Shower/Tub: Other (comment)   Bathroom Toilet: Handicapped height Bathroom Accessibility: Yes   Home Equipment: Conservation officer, nature (2 wheels);Wheelchair - manual;BSC/3in1;Shower seat - built in;Other (comment)   Additional Comments: Lives with daughter and son-in-law. Daughter has MS - is on disability so present 24/7 however only able to provide moderate levels of assist; info obtained per chart review from initial admission on 5/10.      Prior Functioning/Environment Prior Level of Function : Needs assist             Mobility Comments: per chart review, RW, pt stating w/c although poor historian ADLs Comments: reports recieving assistance for ADLs, unabel to further elaborate, per chart review assist for tub transfers, LB ADLs        OT Problem List: Decreased strength;Decreased cognition;Decreased activity tolerance;Impaired balance (sitting and/or standing);Decreased knowledge of use of DME or AE;Obesity;Decreased safety awareness      OT Treatment/Interventions: Self-care/ADL training;Therapeutic exercise;Therapeutic activities;Cognitive remediation/compensation;DME and/or AE instruction;Patient/family education;Balance training    OT Goals(Current goals can be found in the care plan section) Acute Rehab OT Goals Patient Stated Goal: to go home OT Goal Formulation: With patient Time For Goal Achievement: 02/20/22 Potential to Achieve Goals: Good  OT Frequency: Min 2X/week    Co-evaluation              AM-PAC OT "6 Clicks" Daily  Activity     Outcome Measure Help from another person eating meals?: A Little Help from another person taking  care of personal grooming?: A Little Help from another person toileting, which includes using toliet, bedpan, or urinal?: A Lot Help from another person bathing (including washing, rinsing, drying)?: A Lot Help from another person to put on and taking off regular upper body clothing?: A Little Help from another person to put on and taking off regular lower body clothing?: Total 6 Click Score: 14   End of Session Equipment Utilized During Treatment: Rolling walker (2 wheels) Nurse Communication: Mobility status (pt needing new purewick)  Activity Tolerance: Patient tolerated treatment well Patient left: in bed;with call bell/phone within reach;with bed alarm set  OT Visit Diagnosis: Other abnormalities of gait and mobility (R26.89);Muscle weakness (generalized) (M62.81);History of falling (Z91.81);Other symptoms and signs involving cognitive function;Unsteadiness on feet (R26.81)                Time: [id number] OT Time Calculation (min): 30 min Charges:  OT General Charges $OT Visit: 1 Visit OT Evaluation $OT Eval Moderate Complexity: 1 Mod OT Treatments $Self Care/Home Management : 8-22 mins  Lynnda Child, OTD, OTR/L Acute Rehab [phone]) [phone] - [place] 02/06/2022, 4:27 PM

## 2022-02-07 LAB — GLUCOSE, CAPILLARY
Glucose-Capillary: 148 mg/dL — ABNORMAL HIGH (ref 70–99)
Glucose-Capillary: 131 mg/dL — ABNORMAL HIGH (ref 70–99)

## 2022-02-07 MED ORDER — LEVETIRACETAM 1000 MG PO TABS
1000.0000 mg | ORAL_TABLET | Freq: Two times a day (BID) | ORAL | 0 refills | Status: DC
Start: 2022-02-07 — End: 2022-07-17

## 2022-02-07 MED ORDER — TRAZODONE HCL 50 MG PO TABS: 50.0000 mg | ORAL_TABLET | Freq: Every day | ORAL | 0 refills | Status: DC

## 2022-02-07 MED ORDER — PAROXETINE HCL 40 MG PO TABS: 40.0000 mg | ORAL_TABLET | Freq: Every day | ORAL | 0 refills | Status: DC

## 2022-02-07 MED ORDER — LOSARTAN POTASSIUM 50 MG PO TABS: 50.0000 mg | ORAL_TABLET | Freq: Every day | ORAL | Status: DC

## 2022-02-07 MED ORDER — GABAPENTIN 300 MG PO CAPS: 300.0000 mg | ORAL_CAPSULE | Freq: Two times a day (BID) | ORAL | 0 refills | Status: DC

## 2022-02-07 NOTE — Progress Notes (Signed)
Nursing Discharge Note   Admit Date: 02/03/2022  Discharge date: 02/07/2022   Cheyenne Frank is to be discharged to a Skilled Nursing Facility per MD order.  AVS completed, placed in discharge packet for facility review. Discharge packet compiled for facility. Non-emergency ambulance transport arranged. Report called to Remi Haggard  at [facility] .    Allergies as of 02/07/2022       Reactions   Codeine Palpitations   Other Reaction: RACES HEART   Dilaudid [hydromorphone] Other (See Comments), Rash   Other Reaction: lightheaded, altered ms, nervous, Paranoia paranoid Other Reaction: lightheaded, altered ms, nervous, Paranoia   Demerol [meperidine] Other (See Comments), Rash   agitated Agitation   Pravastatin Other (See Comments)   Muscle aches   Amiodarone Other (See Comments)   Per family pt had twitching while taking and cardiologist took pt off    Lipitor [atorvastatin] Other (See Comments), Itching   unknown   Nifedipine Other (See Comments)   unknown   Statins Rash   Makes her crazy   Sulfa Antibiotics Rash   Vancomycin Rash   deafness        Medication List     STOP taking these medications    cephALEXin 500 MG capsule Commonly known as: KEFLEX   fluticasone 50 MCG/ACT nasal spray Commonly known as: FLONASE   ketoconazole 2 % cream Commonly known as: NIZORAL   liver oil-zinc oxide 40 % ointment Commonly known as: DESITIN   nystatin powder Commonly known as: MYCOSTATIN/NYSTOP   potassium chloride SA 20 MEQ tablet Commonly known as: KLOR-CON M   PreserVision AREDS 2 Caps       TAKE these medications    acetaminophen 500 MG tablet Commonly known as: TYLENOL Take 500-1,000 mg by mouth every 6 (six) hours as needed for mild pain or fever.   albuterol 108 (90 Base) MCG/ACT inhaler Commonly known as: VENTOLIN HFA Inhale 2 puffs into the lungs every 6 (six) hours as needed for wheezing or shortness of breath.   apixaban 5 MG Tabs  tablet Commonly known as: Eliquis Take 1 tablet (5 mg total) by mouth 2 (two) times daily.   aspirin EC 81 MG tablet Take 1 tablet (81 mg total) by mouth daily. MAY RESUME ON 12/31/20 What changed: additional instructions   carvedilol 25 MG tablet Commonly known as: COREG Take 25 mg by mouth 2 (two) times daily.   gabapentin 300 MG capsule Commonly known as: NEURONTIN Take 1 capsule (300 mg total) by mouth 2 (two) times daily.   hydrALAZINE 25 MG tablet Commonly known as: APRESOLINE Take 1 tablet (25 mg total) by mouth 3 (three) times daily as needed (For systolic above 160).   insulin glargine 100 UNIT/ML injection Commonly known as: LANTUS Inject 0.35 mLs (35 Units total) into the skin at bedtime. What changed: how much to take   insulin lispro 100 UNIT/ML injection Commonly known as: HumaLOG Inject 0.08 mLs (8 Units total) into the skin 3 (three) times daily with meals. What changed: how much to take   levETIRAcetam 1000 MG tablet Commonly known as: KEPPRA Take 1 tablet (1,000 mg total) by mouth 2 (two) times daily.   losartan 50 MG tablet Commonly known as: COZAAR Take 1 tablet (50 mg total) by mouth daily. What changed:  medication strength how much to take   multivitamin with minerals Tabs tablet Take 1 tablet by mouth daily.   omeprazole 40 MG capsule Commonly known as: PRILOSEC Take 40 mg by  mouth daily.   oxybutynin 10 MG 24 hr tablet Commonly known as: DITROPAN-XL Take 10 mg by mouth daily.   PARoxetine 40 MG tablet Commonly known as: PAXIL Take 1 tablet (40 mg total) by mouth at bedtime.   rosuvastatin 5 MG tablet Commonly known as: CRESTOR Take 2.5 mg by mouth at bedtime.   sitaGLIPtin 50 MG tablet Commonly known as: JANUVIA Take 50 mg by mouth daily.   torsemide 20 MG tablet Commonly known as: DEMADEX Take 1 tablet (20 mg total) by mouth daily. Please start from 11/01/2021   traZODone 50 MG tablet Commonly known as: DESYREL Take 1  tablet (50 mg total) by mouth at bedtime.         Discharge Instructions     Diet - low sodium heart healthy   Complete by: As directed    Increase activity slowly   Complete by: As directed           [facility] to provide transportation to facility for patient. Non-emergency ambulance transport at bedside. Handoff completed with [facility]  staff/EMTs.   Patient discharged from hospital unit via stretcher. Stable at time of discharge.

## 2022-02-07 NOTE — Discharge Summary (Signed)
Physician Discharge Summary  Cheyenne Frank [id number] DOB: 1941-08-26 DOA: 02/03/2022  PCP: [facility]  Admit date: 02/03/2022 Discharge date: 02/07/2022  Time spent: 37 minutes  Recommendations for Outpatient Follow-up:  Note dosage change of losartan this hospitalization-several prescriptions  provided at discharge will need to be continued as per discretion of nursing facility Recommend Chem-12 CBC in about 1 week Recommend careful reorientation in the outpatient setting and referral to sleep specialist for titration of BiPAP-suspect she may have some narcosis underlying Attempt to down titrate polypharmacy in the outpatient setting--note that she has sensitization and opiate sensitivity and we should try to avoid these in the outpatient setting Suggest neurocognitive eval in the outpatient setting-has had a subdural requiring bur hole in the past--these could all cause an underlying cognitive deficit Patient will need outpatient follow-up with neurology with regards to Keppra dosing and continuation in the next 1 to 2 months  Discharge Diagnoses:  MAIN problem for hospitalization   Toxic metabolic encephalopathy on admission with unclear etiology Suspected secondary to hypoglycemia this admission   Please see below for itemized issues addressed in [facility]- refer to other progress notes for clarity if needed  Discharge Condition: Fair  Diet recommendation: Heart healthy  Filed Weights   02/04/22 0500 02/05/22 0500  Weight: 112.1 kg 113.3 kg    History of present illness:  81 year old white female Known history of A-fib RVR with wide-complex tachycardia in the past HFpEF HTN 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 coli bacteremia requiring IV antibiotics wound VAC on abdomen etc.  Patient presented to [facility] initially at on 5/20 with aphasia-the symptoms occurred at least over 24 hours and work-up was done inclusive of neurological and metabolic work-up Neurology saw the patient subsequently initiated the patient on [place] and patient underwent EEG X2-patient stabilized but then developed severe aphasia and headaches  5/15: Admitted 5/16: Relatively better, close to baseline  5/16-5/19: Pending SNF bed 5/19: Recurrence of "aphasia" after episode of hypoglycemia 5/20: Transferred to [facility], LTM EEG started 5/22: LTM EEG with no events over 48 hours, EEG stopped, clinically appears improved    Hospital Course:  Toxic metabolic encephalopathy on admission and quite encephalopathic on admission 5/20 Patient's mentation is slow-she is on several psychotropic meds which may be contributing she also is noncompliant on CPAP which may be contributing The main driver was thought by Dr. Loleta Books to be hypoglycemia-her sugars are better I think she is stable for discharge to rehab at this time to convalesce appropriately-I appreciate neurology seeing the patient and they have signed off and do not think that this is a focal seizure-we would continue Keppra at least for 3 to 6 months We will continue gabapentin 300 twice daily which was a prior to admission med Prior history of CVA with SDH in 2015 Continuing Eliquis 5 twice daily aspirin 81 daily Secondary prevention Crestor 2.5 at bedtime and good blood pressure control as below Paroxysmal A-fib CHADVASC >4 HTN HFpEF--not acute--this is a chronic problem As per above discussions-discharging on Coreg 25 twice daily Continue losartan 50, hydralazine 25 every 8 Continue Demadex 20 daily DM TY 2 Class III obesity BMI 40 We will continue  home meds-outpatient managing and monitoring DO NOT  AGGRESSIVELY CONTROL BLOOD sugar--it seems that she becomes lethargic when it is too tightly controlled OSA noncompliant on CPAP Real risk of hypercarbia-please refer as an outpatient to sleep medicine Depression Underlying possible polypharmacy risk Continue Paxil 40, trazodone 50 for sleep-please attempt to down titrate Paxil to 20 in the outpatient setting  Procedures: Several \  Consultations: Neurology  Discharge Exam: Vitals:   02/07/22 0003 02/07/22 0321  BP: (!) 147/63 (!) 147/71  Pulse: 63 (!) 59  Resp: 18 16  Temp: 97.7 F (36.5 C) 97.8 F (36.6 C)  SpO2: 95% 95%    Subj on day of d/c   Awake quite hard for her to orient No other distress  General Exam on discharge  EOMI NCAT thick neck Mallampati 4 [id number] holosystolic murmur abdomen obese nontender no rebound no guarding no lower extremity edema Posterolaterally lung fields are clear  Discharge Instructions   Discharge Instructions     Diet - low sodium heart healthy   Complete by: As directed    Increase activity slowly   Complete by: As directed       Allergies as of 02/07/2022       Reactions   Codeine Palpitations   Other Reaction: RACES HEART   Dilaudid [hydromorphone] Other (See Comments), Rash   Other Reaction: lightheaded, altered ms, nervous, Paranoia paranoid Other Reaction: lightheaded, altered ms, nervous, Paranoia   Demerol [meperidine] Other (See Comments), Rash   agitated Agitation   Pravastatin Other (See Comments)   Muscle aches   Amiodarone Other (See Comments)   Per family pt had twitching while taking and cardiologist took pt off    Lipitor [atorvastatin] Other (See Comments), Itching   unknown   Nifedipine Other (See Comments)   unknown   Statins Rash   Makes her crazy   Sulfa Antibiotics Rash   Vancomycin Rash   deafness        Medication List     STOP taking these medications    cephALEXin 500 MG capsule Commonly known as: KEFLEX   fluticasone 50  MCG/ACT nasal spray Commonly known as: FLONASE   ketoconazole 2 % cream Commonly known as: NIZORAL   liver oil-zinc oxide 40 % ointment Commonly known as: DESITIN   nystatin powder Commonly known as: MYCOSTATIN/NYSTOP   potassium chloride SA 20 MEQ tablet Commonly known as: KLOR-CON M   PreserVision AREDS 2 Caps       TAKE these medications    acetaminophen 500 MG tablet Commonly known as: TYLENOL Take 500-1,000 mg by mouth every 6 (six) hours as needed for mild pain or fever.   albuterol 108 (90 Base) MCG/ACT inhaler Commonly known as: VENTOLIN HFA Inhale 2 puffs into the lungs every 6 (six) hours as needed for wheezing or shortness of breath.   apixaban 5 MG Tabs tablet Commonly known as: Eliquis Take 1 tablet (5 mg total) by mouth 2 (two) times daily.   aspirin EC 81 MG tablet Take 1 tablet (81 mg total) by mouth daily. MAY RESUME ON 12/31/20 What changed: additional instructions   carvedilol 25 MG tablet Commonly known as: COREG Take 25 mg by mouth 2 (two) times daily.   gabapentin 300 MG capsule Commonly known as: NEURONTIN Take 1 capsule (300 mg total) by mouth 2 (two) times daily.   hydrALAZINE 25 MG tablet Commonly known as: APRESOLINE Take 1 tablet (25 mg total) by mouth 3 (three) times daily as needed (For systolic above  160).   insulin glargine 100 UNIT/ML injection Commonly known as: LANTUS Inject 0.35 mLs (35 Units total) into the skin at bedtime. What changed: how much to take   insulin lispro 100 UNIT/ML injection Commonly known as: HumaLOG Inject 0.08 mLs (8 Units total) into the skin 3 (three) times daily with meals. What changed: how much to take   levETIRAcetam 1000 MG tablet Commonly known as: KEPPRA Take 1 tablet (1,000 mg total) by mouth 2 (two) times daily.   losartan 50 MG tablet Commonly known as: COZAAR Take 1 tablet (50 mg total) by mouth daily. What changed:  medication strength how much to take   multivitamin with  minerals Tabs tablet Take 1 tablet by mouth daily.   omeprazole 40 MG capsule Commonly known as: PRILOSEC Take 40 mg by mouth daily.   oxybutynin 10 MG 24 hr tablet Commonly known as: DITROPAN-XL Take 10 mg by mouth daily.   PARoxetine 40 MG tablet Commonly known as: PAXIL Take 1 tablet (40 mg total) by mouth at bedtime.   rosuvastatin 5 MG tablet Commonly known as: CRESTOR Take 2.5 mg by mouth at bedtime.   sitaGLIPtin 50 MG tablet Commonly known as: JANUVIA Take 50 mg by mouth daily.   torsemide 20 MG tablet Commonly known as: DEMADEX Take 1 tablet (20 mg total) by mouth daily. Please start from 11/01/2021   traZODone 50 MG tablet Commonly known as: DESYREL Take 1 tablet (50 mg total) by mouth at bedtime.       Allergies  Allergen Reactions   Codeine Palpitations    Other Reaction: RACES HEART   Dilaudid [Hydromorphone] Other (See Comments) and Rash    Other Reaction: lightheaded, altered ms, nervous, Paranoia paranoid Other Reaction: lightheaded, altered ms, nervous, Paranoia   Demerol [Meperidine] Other (See Comments) and Rash    agitated Agitation   Pravastatin Other (See Comments)    Muscle aches   Amiodarone Other (See Comments)    Per family pt had twitching while taking and cardiologist took pt off    Lipitor [Atorvastatin] Other (See Comments) and Itching    unknown   Nifedipine Other (See Comments)    unknown   Statins Rash    Makes her crazy   Sulfa Antibiotics Rash   Vancomycin Rash    deafness      The results of significant diagnostics from this hospitalization (including imaging, microbiology, ancillary and laboratory) are listed below for reference.    Significant Diagnostic Studies: CT HEAD WO CONTRAST (5MM)  Result Date: 02/02/2022 CLINICAL DATA:  Headache, new or worsening (Age >= 50y) EXAM: CT HEAD WITHOUT CONTRAST TECHNIQUE: Contiguous axial images were obtained from the base of the skull through the vertex without intravenous  contrast. RADIATION DOSE REDUCTION: This exam was performed according to the departmental dose-optimization program which includes automated exposure control, adjustment of the mA and/or kV according to patient size and/or use of iterative reconstruction technique. COMPARISON:  Brain MRI 01/30/2022, 3 days ago, head CT 01/29/2022 FINDINGS: Brain: Scratched no intracranial hemorrhage, mass effect, or midline shift. No hydrocephalus. The basilar cisterns are patent. Unchanged remote left thalamic infarct. No evidence of territorial infarct or acute ischemia. No extra-axial or intracranial fluid collection. Vascular: Atherosclerosis of skullbase vasculature without hyperdense vessel or abnormal calcification. Skull: No fracture or focal lesion. Remote left-sided craniotomy defect. Sinuses/Orbits: Scattered mucosal thickening of paranasal sinuses, unchanged. No acute findings. Bilateral cataract resection. Other: None. IMPRESSION: 1. No acute intracranial abnormality. 2. Unchanged remote left thalamic infarct.  Electronically Signed   By: Keith Rake M.D.   On: 02/02/2022 23:49   CT Head Wo Contrast  Result Date: 01/24/2022 CLINICAL DATA:  Stroke suspected neuro deficit EXAM: CT HEAD WITHOUT CONTRAST TECHNIQUE: Contiguous axial images were obtained from the base of the skull through the vertex without intravenous contrast. RADIATION DOSE REDUCTION: This exam was performed according to the departmental dose-optimization program which includes automated exposure control, adjustment of the mA and/or kV according to patient size and/or use of iterative reconstruction technique. COMPARISON:  01/13/2021 FINDINGS: Brain: No evidence of acute infarction, hemorrhage, cerebral edema, mass, mass effect, or midline shift. No hydrocephalus or extra-axial fluid collection. Redemonstrated left thalamic lacunar infarct. Periventricular white matter changes, likely the sequela of chronic small vessel ischemic disease. Cerebral  volume loss is likely within normal limits for age. Vascular: No hyperdense vessel. Atherosclerotic calcifications in the intracranial carotid and vertebral arteries. Skull: Hyperostosis frontalis. Left frontal burr hole. Negative for fracture or focal lesion. Sinuses/Orbits: No acute finding. Other: The mastoid air cells are well aerated. IMPRESSION: No acute intracranial process. Electronically Signed   By: Merilyn Baba M.D.   On: 01/24/2022 18:53   MR ANGIO HEAD WO CONTRAST  Result Date: 01/24/2022 CLINICAL DATA:  Initial evaluation for neuro deficit, stroke suspected. EXAM: MRI HEAD WITHOUT CONTRAST MRA HEAD WITHOUT CONTRAST MRA NECK WITHOUT AND WITH CONTRAST TECHNIQUE: Multiplanar, multi-echo pulse sequences of the brain and surrounding structures were acquired without intravenous contrast. Angiographic images of the Circle of Willis were acquired using MRA technique without intravenous contrast. Angiographic images of the neck were acquired using MRA technique without and with intravenous contrast. Carotid stenosis measurements (when applicable) are obtained utilizing NASCET criteria, using the distal internal carotid diameter as the denominator. CONTRAST:  None. COMPARISON:  Comparison made with prior CT from earlier the same day. FINDINGS: MRI HEAD FINDINGS Brain: Examination technically limited as the patient was unable to tolerate the full length of the study. Axial and coronal DWI sequences, with axial T2 and sagittal T1 weighted sequences only were performed. Additionally, the provided images are degraded by motion. Generalized age-related cerebral atrophy. Remote lacunar infarct present at the ventral left thalamus. No abnormal foci of restricted diffusion to suggest acute or subacute ischemia. Gray-white matter differentiation otherwise grossly maintained. No other visible areas of chronic cortical infarction. No obvious evidence for intracranial hemorrhage on this limited exam. No visible mass  lesion, mass effect or midline shift. Ventricles normal size without hydrocephalus. No extra-axial fluid collection. Vascular: Major intracranial vascular flow voids are grossly maintained at the skull base. Skull and upper cervical spine: Craniocervical junction grossly within normal limits. No obvious focal marrow replacing lesion. No scalp soft tissue abnormality. Sinuses/Orbits: Prior bilateral ocular lens replacement. Globes and orbital soft tissues demonstrate no definite acute abnormality. Mild scattered mucosal thickening about the ethmoidal air cells. Paranasal sinuses are otherwise largely clear. No significant mastoid effusion. Other: None. MRA HEAD FINDINGS Not performed as the patient was unable to tolerate this portion of the ordered study. MRA NECK FINDINGS Not performed as the patient was unable to tolerate this portion of the ordered study. IMPRESSION: 1. Technically limited exam due to the patient's inability to tolerate the full length of the study and motion artifact. 2. No acute intracranial infarct. No other definite acute abnormality identified. 3. Age-related cerebral atrophy with remote left thalamic lacunar infarct. 4. Please note that the ordered MRAs of the head and neck were unable to be performed due to the patient's inability  to tolerate the full length of the study. No charges should be applied to the patient. A repeat examination could be attempted at a later time as the patient is able to tolerate. Alternatively, correlation with dedicated CTA and/or carotid Doppler ultrasound could also be performed as warranted. Electronically Signed   By: Jeannine Boga M.D.   On: 01/24/2022 22:02   MR Angiogram Neck W or Wo Contrast  Result Date: 01/24/2022 CLINICAL DATA:  Initial evaluation for neuro deficit, stroke suspected. EXAM: MRI HEAD WITHOUT CONTRAST MRA HEAD WITHOUT CONTRAST MRA NECK WITHOUT AND WITH CONTRAST TECHNIQUE: Multiplanar, multi-echo pulse sequences of the brain and  surrounding structures were acquired without intravenous contrast. Angiographic images of the Circle of Willis were acquired using MRA technique without intravenous contrast. Angiographic images of the neck were acquired using MRA technique without and with intravenous contrast. Carotid stenosis measurements (when applicable) are obtained utilizing NASCET criteria, using the distal internal carotid diameter as the denominator. CONTRAST:  None. COMPARISON:  Comparison made with prior CT from earlier the same day. FINDINGS: MRI HEAD FINDINGS Brain: Examination technically limited as the patient was unable to tolerate the full length of the study. Axial and coronal DWI sequences, with axial T2 and sagittal T1 weighted sequences only were performed. Additionally, the provided images are degraded by motion. Generalized age-related cerebral atrophy. Remote lacunar infarct present at the ventral left thalamus. No abnormal foci of restricted diffusion to suggest acute or subacute ischemia. Gray-white matter differentiation otherwise grossly maintained. No other visible areas of chronic cortical infarction. No obvious evidence for intracranial hemorrhage on this limited exam. No visible mass lesion, mass effect or midline shift. Ventricles normal size without hydrocephalus. No extra-axial fluid collection. Vascular: Major intracranial vascular flow voids are grossly maintained at the skull base. Skull and upper cervical spine: Craniocervical junction grossly within normal limits. No obvious focal marrow replacing lesion. No scalp soft tissue abnormality. Sinuses/Orbits: Prior bilateral ocular lens replacement. Globes and orbital soft tissues demonstrate no definite acute abnormality. Mild scattered mucosal thickening about the ethmoidal air cells. Paranasal sinuses are otherwise largely clear. No significant mastoid effusion. Other: None. MRA HEAD FINDINGS Not performed as the patient was unable to tolerate this portion of  the ordered study. MRA NECK FINDINGS Not performed as the patient was unable to tolerate this portion of the ordered study. IMPRESSION: 1. Technically limited exam due to the patient's inability to tolerate the full length of the study and motion artifact. 2. No acute intracranial infarct. No other definite acute abnormality identified. 3. Age-related cerebral atrophy with remote left thalamic lacunar infarct. 4. Please note that the ordered MRAs of the head and neck were unable to be performed due to the patient's inability to tolerate the full length of the study. No charges should be applied to the patient. A repeat examination could be attempted at a later time as the patient is able to tolerate. Alternatively, correlation with dedicated CTA and/or carotid Doppler ultrasound could also be performed as warranted. Electronically Signed   By: Jeannine Boga M.D.   On: 01/24/2022 22:02   MR BRAIN WO CONTRAST  Result Date: 01/30/2022 CLINICAL DATA:  Altered mental status and acute neurologic deficit EXAM: MRI HEAD WITHOUT CONTRAST TECHNIQUE: Multiplanar, multiecho pulse sequences of the brain and surrounding structures were obtained without intravenous contrast. COMPARISON:  01/24/2022 FINDINGS: Severely motion degraded examination. Brain: No acute infarct, mass effect or extra-axial collection. Small vessel infarct within the ventral left thalamus is unchanged. There is multifocal hyperintense  T2-weighted signal within the white matter. Generalized cerebral volume loss. The midline structures are normal. Vascular: Major flow voids are preserved. Skull and upper cervical spine: Normal calvarium and skull base. Visualized upper cervical spine and soft tissues are normal. Sinuses/Orbits:No paranasal sinus fluid levels or advanced mucosal thickening. No mastoid or middle ear effusion. Normal orbits. IMPRESSION: 1. Severely motion degraded examination. 2. No acute intracranial abnormality. 3. Unchanged small  vessel infarct within the ventral left thalamus. Electronically Signed   By: Deatra Robinson M.D.   On: 01/30/2022 01:34   MR BRAIN WO CONTRAST  Result Date: 01/24/2022 CLINICAL DATA:  Initial evaluation for neuro deficit, stroke suspected. EXAM: MRI HEAD WITHOUT CONTRAST MRA HEAD WITHOUT CONTRAST MRA NECK WITHOUT AND WITH CONTRAST TECHNIQUE: Multiplanar, multi-echo pulse sequences of the brain and surrounding structures were acquired without intravenous contrast. Angiographic images of the Circle of Willis were acquired using MRA technique without intravenous contrast. Angiographic images of the neck were acquired using MRA technique without and with intravenous contrast. Carotid stenosis measurements (when applicable) are obtained utilizing NASCET criteria, using the distal internal carotid diameter as the denominator. CONTRAST:  None. COMPARISON:  Comparison made with prior CT from earlier the same day. FINDINGS: MRI HEAD FINDINGS Brain: Examination technically limited as the patient was unable to tolerate the full length of the study. Axial and coronal DWI sequences, with axial T2 and sagittal T1 weighted sequences only were performed. Additionally, the provided images are degraded by motion. Generalized age-related cerebral atrophy. Remote lacunar infarct present at the ventral left thalamus. No abnormal foci of restricted diffusion to suggest acute or subacute ischemia. Gray-white matter differentiation otherwise grossly maintained. No other visible areas of chronic cortical infarction. No obvious evidence for intracranial hemorrhage on this limited exam. No visible mass lesion, mass effect or midline shift. Ventricles normal size without hydrocephalus. No extra-axial fluid collection. Vascular: Major intracranial vascular flow voids are grossly maintained at the skull base. Skull and upper cervical spine: Craniocervical junction grossly within normal limits. No obvious focal marrow replacing lesion. No  scalp soft tissue abnormality. Sinuses/Orbits: Prior bilateral ocular lens replacement. Globes and orbital soft tissues demonstrate no definite acute abnormality. Mild scattered mucosal thickening about the ethmoidal air cells. Paranasal sinuses are otherwise largely clear. No significant mastoid effusion. Other: None. MRA HEAD FINDINGS Not performed as the patient was unable to tolerate this portion of the ordered study. MRA NECK FINDINGS Not performed as the patient was unable to tolerate this portion of the ordered study. IMPRESSION: 1. Technically limited exam due to the patient's inability to tolerate the full length of the study and motion artifact. 2. No acute intracranial infarct. No other definite acute abnormality identified. 3. Age-related cerebral atrophy with remote left thalamic lacunar infarct. 4. Please note that the ordered MRAs of the head and neck were unable to be performed due to the patient's inability to tolerate the full length of the study. No charges should be applied to the patient. A repeat examination could be attempted at a later time as the patient is able to tolerate. Alternatively, correlation with dedicated CTA and/or carotid Doppler ultrasound could also be performed as warranted. Electronically Signed   By: Rise Mu M.D.   On: 01/24/2022 22:02   US Carotid Bilateral (at [facility] and [facility] only)  Result Date: 01/25/2022 CLINICAL DATA:  Initial evaluation for neuro deficit, stroke. EXAM: BILATERAL CAROTID DUPLEX ULTRASOUND TECHNIQUE: Wallace Cullens scale imaging, color Doppler and duplex ultrasound were performed of bilateral carotid and vertebral arteries  in the neck. COMPARISON:  Prior brain MRI from 01/24/2022. FINDINGS: Criteria: Quantification of carotid stenosis is based on velocity parameters that correlate the residual internal carotid diameter with NASCET-based stenosis levels, using the diameter of the distal internal carotid lumen as the denominator for stenosis  measurement. The following velocity measurements were obtained: RIGHT ICA: 73/13 cm/sec CCA: [id number] cm/sec SYSTOLIC ICA/CCA RATIO:  1.0 ECA: 66 cm/sec LEFT ICA: 99/21 cm/sec CCA: 0000000 cm/sec SYSTOLIC ICA/CCA RATIO:  1.2 ECA: 106 cm/sec RIGHT CAROTID ARTERY: Scattered intimal atherosclerotic irregularity within the visualized right CCA without hemodynamically significant stenosis. Mild heterogeneous atherosclerotic plaque present about the right carotid bulb extending into the proximal right ICA. No elevation in peak systolic velocity to suggest hemodynamically significant stenosis. Visualized right ICA patent distally without stenosis. RIGHT VERTEBRAL ARTERY:  Patent with antegrade flow. LEFT CAROTID ARTERY: Mild smooth intimal thickening within the visualized left CCA without stenosis. Mild for age atheromatous irregularity about the left carotid bulb, extending into the proximal left ICA. No elevation in peak systolic velocity to suggest hemodynamically significant stenosis. Visualized left ICA widely patent distally without stenosis. LEFT VERTEBRAL ARTERY:  Patent with antegrade flow. IMPRESSION: 1. Mild atherosclerotic irregularity and plaque about the bilateral carotid bulbs, extending into the proximal internal carotid arteries, right greater than left, but with no evidence for hemodynamically significant stenosis. 2. Patent antegrade flow within both vertebral arteries. Electronically Signed   By: Jeannine Boga M.D.   On: 01/25/2022 02:47   EEG adult  Result Date: 01/29/2022 Greta Doom, MD     01/29/2022  4:01 PM History: 81 yo F with intermittent aphasia Sedation: None Technique: This EEG was acquired with electrodes placed according to the International 10-20 electrode system (including Fp1, Fp2, F3, F4, C3, C4, P3, P4, O1, O2, T3, T4, T5, T6, A1, A2, Fz, Cz, Pz). The following electrodes were missing or displaced: none. Background: The background consists of intermixed alpha and beta  activities. There is a well defined posterior dominant rhythm of 9 Hz that attenuates with eye opening. Sleep is recorded with normal appearing structures. Photic stimulation: Physiologic driving is not performed EEG Abnormalities: none Clinical Interpretation: This normal EEG is recorded in the waking and sleep state. There was no seizure or seizure predisposition recorded on this study. Please note that lack of epileptiform activity on EEG does not preclude the possibility of epilepsy. Roland Rack, MD [facility] [phone] If 7pm- 7am, please page neurology on call as listed in [place].   EEG adult  Result Date: 01/25/2022 Lora Havens, MD     01/25/2022  6:58 PM Patient Name: GIMENA PINSKI MRN: [id number] Epilepsy Attending: Lora Havens Referring Physician/Provider: Lorenza Chick, MD Date: 01/25/2022 Duration: 22.31 mins Patient history: 81 y.o. female with medical history significant for Paroxysmal A-fib on Eliquis, prior stroke presenting to the ED with difficulty getting her words out similar to her prior strokes. EEG to evaluate for seizure Level of alertness: Awake, asleep AEDs during EEG study: LEV Technical aspects: This EEG study was done with scalp electrodes positioned according to the 10-20 International system of electrode placement. Electrical activity was acquired at a sampling rate of 500Hz  and reviewed with a high frequency filter of 70Hz  and a low frequency filter of 1Hz . EEG data were recorded continuously and digitally stored. Description: The posterior dominant rhythm consists of 8-9 Hz activity of moderate voltage (25-35 uV) seen predominantly in posterior head regions, symmetric and reactive to eye opening and eye closing. Sleep  was characterized by vertex waves, sleep spindles (12 to 14 Hz), maximal frontocentral region. EEG showed intermittent generalized 3 to 6 Hz theta-delta slowing.  Hyperventilation and photic stimulation were not performed.    ABNORMALITY - Intermittent slow, generalized IMPRESSION: This study is suggestive of mild diffuse encephalopathy, nonspecific etiology. No seizures or epileptiform discharges were seen throughout the recording. Priyanka Barbra Sarks   Overnight EEG with video  Result Date: 02/04/2022 Lora Havens, MD     02/05/2022  6:04 AM Patient Name: EVOLETT KOTLARZ MRN: [id number] Epilepsy Attending: Lora Havens Referring Physician/Provider: Amie Portland, MD Duration: 02/03/2022 2223 to 02/04/2022 2223 Patient history: 81 year old female who has been having intermittent episodes of difficulty speaking. EEG to evaluate for seizure Level of alertness: Awake, asleep AEDs during EEG study: LEV, GBP Technical aspects: This EEG study was done with scalp electrodes positioned according to the 10-20 International system of electrode placement. Electrical activity was acquired at a sampling rate of 500Hz  and reviewed with a high frequency filter of 70Hz  and a low frequency filter of 1Hz . EEG data were recorded continuously and digitally stored. Description: The posterior dominant rhythm consists of 9 Hz activity of moderate voltage (25-35 uV) seen predominantly in posterior head regions, symmetric and reactive to eye opening and eye closing. Sleep was characterized by vertex waves, sleep spindles (12 to 14 Hz), maximal frontocentral region.  Hyperventilation and photic stimulation were not performed.   IMPRESSION: This study is within normal limits. No seizures or epileptiform discharges were seen throughout the recording. Priyanka Barbra Sarks   CT ANGIO HEAD NECK W WO CM (CODE STROKE)  Result Date: 01/29/2022 CLINICAL DATA:  Neuro deficit, acute, stroke suspected EXAM: CT ANGIOGRAPHY HEAD AND NECK TECHNIQUE: Multidetector CT imaging of the head and neck was performed using the standard protocol during bolus administration of intravenous contrast. Multiplanar CT image reconstructions and MIPs were obtained to evaluate the vascular  anatomy. Carotid stenosis measurements (when applicable) are obtained utilizing NASCET criteria, using the distal internal carotid diameter as the denominator. RADIATION DOSE REDUCTION: This exam was performed according to the departmental dose-optimization program which includes automated exposure control, adjustment of the mA and/or kV according to patient size and/or use of iterative reconstruction technique. CONTRAST:  10mL OMNIPAQUE IOHEXOL 350 MG/ML SOLN COMPARISON:  CT head 01/24/2022 FINDINGS: CT HEAD Brain: There is no acute intracranial hemorrhage, mass effect, or edema. No new loss of gray-white differentiation. Chronic left thalamic infarct. Stable findings of probable chronic microvascular ischemic changes in the cerebral white matter. There is no extra-axial fluid collection. Ventricles and sulci are stable in size and configuration. Vascular: No hyperdense vessel. Skull: Small left craniotomy. Sinuses/Orbits: No acute finding.  Patchy mucosal thickening. Other: None. Review of the MIP images confirms the above findings CTA NECK Streak artifact from dental amalgam. Aortic arch: Mild calcified plaque. Great vessel origins are patent. Right carotid system: Patent. No stenosis. Partially retropharyngeal course. Left carotid system: Patent.  No stenosis. Vertebral arteries: Patent and codominant.  No stenosis. Skeleton: Degenerative changes of the included spine. Degenerative changes of the temporomandibular joints. Other neck: Unremarkable. Upper chest: No apical lung mass. Review of the MIP images confirms the above findings CTA HEAD Anterior circulation: Intracranial internal carotid arteries are patent minimal calcified plaque. Anterior cerebral arteries are patent. Left A1 ACA is dominant. Anterior communicating artery is present. Middle cerebral arteries are patent. Posterior circulation: Intracranial vertebral arteries, basilar artery, and posterior cerebral arteries are patent. Venous sinuses:  Patent as allowed  by contrast bolus timing. Review of the MIP images confirms the above findings IMPRESSION: No acute intracranial abnormality. No evidence of evolving recent infarction. No large vessel occlusion, hemodynamically significant stenosis, or evidence of dissection. Electronically Signed   By: Macy Mis M.D.   On: 01/29/2022 13:00    Microbiology: Recent Results (from the past 240 hour(s))  Urine Culture     Status: Abnormal   Collection Time: 01/31/22  3:59 PM   Specimen: Urine, Clean Catch  Result Value Ref Range Status   Specimen Description   Final    URINE, CLEAN CATCH Performed at [facility], [address] [place], [place] [address]    Special Requests   Final    NONE Performed at [facility], [place] [address]    Culture >=100,000 COLONIES/mL ESCHERICHIA COLI (A)  Final   Report Status 02/02/2022 FINAL  Final   Organism ID, Bacteria ESCHERICHIA COLI (A)  Final      Susceptibility   Escherichia coli - MIC*    AMPICILLIN 4 SENSITIVE Sensitive     CEFAZOLIN <=4 SENSITIVE Sensitive     CEFEPIME <=0.12 SENSITIVE Sensitive     CEFTRIAXONE <=0.25 SENSITIVE Sensitive     CIPROFLOXACIN <=0.25 SENSITIVE Sensitive     GENTAMICIN <=1 SENSITIVE Sensitive     IMIPENEM <=0.25 SENSITIVE Sensitive     NITROFURANTOIN <=16 SENSITIVE Sensitive     TRIMETH/SULFA <=20 SENSITIVE Sensitive     AMPICILLIN/SULBACTAM <=2 SENSITIVE Sensitive     PIP/TAZO <=4 SENSITIVE Sensitive     * >=100,000 COLONIES/mL ESCHERICHIA COLI     Labs: Basic Metabolic Panel: Recent Labs  Lab 02/03/22 2134 02/05/22 0038 02/06/22 0434  NA 137 141 142  K 3.9 3.8 3.9  CL 97* 104 106  CO2 31 29 30   GLUCOSE 178* 147* 99  BUN 25* 24* 24*  CREATININE 1.34* 1.36* 1.23*  CALCIUM 9.0 8.7* 8.8*  MG 2.1  --   --    Liver Function Tests: Recent Labs  Lab 02/03/22 2134  AST 37  ALT 41  ALKPHOS 104  BILITOT 0.7  PROT 6.2*  ALBUMIN 2.7*   No  results for input(s): LIPASE, AMYLASE in the last 168 hours. Recent Labs  Lab 02/05/22 0837  AMMONIA 36*   CBC: Recent Labs  Lab 02/03/22 2134 02/05/22 0038 02/06/22 0434  WBC 6.9 9.3 8.2  NEUTROABS 4.7  --   --   HGB 13.8 13.2 13.1  HCT 41.8 42.4 40.0  MCV 90.1 93.0 90.9  PLT 193 185 172   Cardiac Enzymes: No results for input(s): CKTOTAL, CKMB, CKMBINDEX, TROPONINI in the last 168 hours. BNP: BNP (last 3 results) Recent Labs    05/16/21 1745 09/16/21 1339  BNP 70.7 64.9    ProBNP (last 3 results) No results for input(s): PROBNP in the last 8760 hours.  CBG: Recent Labs  Lab 02/06/22 0606 02/06/22 1045 02/06/22 1624 02/06/22 2145 02/07/22 0622  GLUCAP 112* 144* 180* 127* 148*       Signed:  Nita Sells MD   [facility] 02/07/2022, 10:09 AM

## 2022-02-07 NOTE — TOC Transition Note (Signed)
Transition of Care [facility]) - CM/SW Discharge Note   Patient Details  Name: Cheyenne Frank MRN: [id number] Date of Birth: 1941/07/10  [facility] CM/SW Contact:  Baldemar Lenis, LCSW Phone Number: 02/07/2022, 11:20 AM   Clinical Narrative:   CSW confirmed with [facility] that they can admit patient today, and with MD that patient is medically stable. CSW updated daughter Cheyenne Frank via phone, confirmed transportation plan. Transport scheduled with [facility] for next available.  Nurse to call report to [phone], Room 9B.    Final next level of care: Skilled Nursing Facility Barriers to Discharge: Barriers Resolved   Patient Goals and CMS Choice        Discharge Placement              Patient chooses bed at: [facility] Patient to be transferred to facility by: [facility] Name of family member notified: Cheyenne Frank Patient and family notified of of transfer: 02/07/22  Discharge Plan and Services                                     Social Determinants of Health (SDOH) Interventions     Readmission Risk Interventions    01/28/2022    2:28 PM  Readmission Risk Prevention Plan  Transportation Screening Complete  PCP or Specialist Appt within 3-5 Days Complete  HRI or Home Care Consult Complete  Social Work Consult for Recovery Care Planning/Counseling Complete  Palliative Care Screening Complete  Medication Review [employer]) Complete

## 2022-05-14 ENCOUNTER — Ambulatory Visit: Payer: Commercial Managed Care - PPO

## 2022-05-14 DIAGNOSIS — Z789 Other specified health status: Secondary | ICD-10-CM

## 2022-05-14 NOTE — Progress Notes (Signed)
PC SW placed TC to patients daughter, [name].   Daughter inquired about additional in home support for patient. Daughter shared that patients other daughter lives with patient, however she is having difficulty providing the care needs of patient. Daughter is interested in hiring private care givers up to 8 hours a day to assist with in home care needs.  Resources/interventions: SW and daughter discussed different in home care agencies and possible pricing variable. SW emailed ([email or web address]) daughter the following home care agencies to consider and review with family: 1. [facility] [phone] 2. [facility] [phone] 3. [facility] [phone]   Daughter is appreciative of TC and had no other needs/concerns for SW at this time. Patient has initial PC visit with PC NP - K. Smith on 05/17/22.

## 2022-05-17 ENCOUNTER — Telehealth: Payer: Medicare Other | Admitting: Primary Care

## 2022-05-17 DIAGNOSIS — Z8673 Personal history of transient ischemic attack (TIA), and cerebral infarction without residual deficits: Secondary | ICD-10-CM

## 2022-05-17 DIAGNOSIS — F0390 Unspecified dementia without behavioral disturbance: Secondary | ICD-10-CM

## 2022-05-17 DIAGNOSIS — R4701 Aphasia: Secondary | ICD-10-CM

## 2022-05-17 DIAGNOSIS — Z515 Encounter for palliative care: Secondary | ICD-10-CM

## 2022-05-17 DIAGNOSIS — I5032 Chronic diastolic (congestive) heart failure: Secondary | ICD-10-CM

## 2022-05-17 NOTE — Progress Notes (Signed)
    Therapist, nutritional Palliative Care Consult Note Telephone: [phone]  Fax: [phone]    Date of encounter: 05/17/22 PATIENT NAME: Cheyenne Frank [address] [place] [address]   [phone] (home)  DOB: 1940/10/15 MRN: [id number] PRIMARY CARE PROVIDER:    [facility],  [address] [place] [address] [place] [address] [phone]  REFERRING PROVIDER:   [facility] [address] [place],  [place] [address] [phone]  RESPONSIBLE PARTY:    Contact Information     Name Relation Home Work [place] Daughter [phone]  [phone]   Nason,Cynthia Daughter   [phone]       Due to the COVID-19 crisis, this visit was done via telemedicine from my office and it was initiated and consent by this patient and or family.  I connected with  [name] on 05/17/22 by a video enabled telemedicine application and verified that I am speaking with the correct person using two identifiers.   I discussed the limitations of evaluation and management by telemedicine. The patient expressed understanding and agreed to proceed.  Palliative Care was asked to follow this patient by consultation request of  [facility], [facility] to address advance care planning.                                   ASSESSMENT AND PLAN / RECOMMENDATIONS:   Advance Care Planning/Goals of Care: Goals include to maximize quality of life and symptom management. Patient/health care surrogate gave his/her permission to discuss.Our advance care planning conversation included a discussion about:    The value and importance of advance care planning  Experiences with loved ones who have been seriously ill or have died  Exploration of personal, cultural or spiritual beliefs that might influence medical decisions  Exploration of goals of care in the event of a sudden injury or illness  Identification  and preparation of a healthcare agent -  daughters are HCPOA Review  of an  advance directive document . Spoke mostly with POA, pt is totally dependent in [place] and some mobility in home with w/c. She cannot transfer independently safely. Can self feed, do upper body ADL. Cognition is fair, knows family but oriented x 2 most of the time. Says it is December 29 today. Has DNR decision  in place but no form. CODE STATUS: DNR Does not have MOST  or DNR form. Discussed LTC and placement due to increasing care needs and inability of family to provide 24/7. Has 8 hour nurse starting soon, and home health currently in place.   Follow up Palliative Care Visit: Palliative care will continue to follow for complex medical decision making, advance care planning, and clarification of goals. Return 4 weeks or prn.  I spent 30 minutes providing this consultation. More than 50% of the time in this consultation was spent in counseling and care coordination.  Thank you for the opportunity to participate in the care of [name].  The palliative care team will continue to follow. Please call our office at [phone] if we can be of additional assistance.   Eliezer Lofts, NP ,

## 2022-05-29 ENCOUNTER — Other Ambulatory Visit: Payer: Medicare Other | Admitting: Primary Care

## 2022-05-29 DIAGNOSIS — I5032 Chronic diastolic (congestive) heart failure: Secondary | ICD-10-CM

## 2022-05-29 DIAGNOSIS — E66813 Obesity, class 3: Secondary | ICD-10-CM

## 2022-05-29 DIAGNOSIS — F0390 Unspecified dementia without behavioral disturbance: Secondary | ICD-10-CM

## 2022-05-29 NOTE — Progress Notes (Signed)
Designer, jewellery Palliative Care Consult Note Telephone: [phone]  Fax: [phone]    Date of encounter: 05/29/22 11:59 AM PATIENT NAME: Cheyenne Frank [address]   [phone] (home)  DOB: Nov 01, 1940 MRN: [id number] PRIMARY CARE PROVIDER:    [facility],  [address] [place] [address] [phone]  REFERRING PROVIDER:   [facility], [place],  [place] [address] [phone]  RESPONSIBLE PARTY:    Contact Information     Name Relation Home Work [place] Daughter [phone]  [phone]   Nason,Cynthia Daughter   [phone]        I met face to face with patient and family in  home. Palliative Care was asked to follow this patient by consultation request of  [place] to address advance care planning and complex medical decision making. This is a follow up visit.                                   ASSESSMENT AND PLAN / RECOMMENDATIONS:   Advance Care Planning/Goals of Care: Goals include to maximize quality of life and symptom management. Patient/health care surrogate gave his/her permission to discuss.Our advance care planning conversation included a discussion about:    The value and importance of advance care planning  Experiences with loved ones who have been seriously ill or have died  Exploration of personal, cultural or spiritual beliefs that might influence medical decisions  Exploration of goals of care in the event of a sudden injury or illness  Identification of a healthcare agent - daughters Review creation of an advance directive document . Decision not to resuscitate  due to poor prognosis. CODE STATUS: DNR form given, MOST left I completed a MOST form today. The patient and family outlined their wishes for the following treatment decisions:  Cardiopulmonary Resuscitation: Do Not  Attempt Resuscitation (DNR/No CPR)  Medical Interventions: Limited Additional Interventions: Use medical treatment, IV fluids and cardiac monitoring as indicated, DO NOT USE intubation or mechanical ventilation. May consider use of less invasive airway support such as BiPAP or CPAP. Also provide comfort measures. Transfer to the hospital if indicated. Avoid intensive care.   Antibiotics: Determine use of limitation of antibiotics when infection occurs  IV Fluids: IV fluids for a defined trial period  Feeding Tube: No feeding tube    I spent 30 minutes providing this consultation. More than 50% of the time in this consultation was spent in counseling and care coordination. -------------------------------------------------------------------------------------------  Symptom Management/Plan:  Insomnia: Up several times at hs. Has trazodone 50 mg at hs. Cutting down on evening drinks. Instructed she could try 75 mg. Impacting rest of caregiver for her to be up at night.  Mentation: Has memory loss, knows family, able to ambulate some to kitchen for food. Participated in ACP and made her wishes known to family.  Nutrition:appetite WNL Education RE low sodium.   Cough:  Had done imaging in the hospital, no recent PNA. encouraged to weigh on scales to monitor. They will look for a scale with handle. Monitor fluid levels for chf. Continue to monitor. Immunizations for flu recommended.  Follow up Palliative Care Visit: Palliative care will continue to follow for complex medical decision making, advance care planning, and clarification of goals. Return 8 weeks or prn.   PPS: 40%  HOSPICE ELIGIBILITY/DIAGNOSIS: no  Chief Complaint: memory loss, debility  HISTORY OF PRESENT ILLNESS:  Cheyenne Frank is a 81 y.o. year old female  with h/o moderate cognition, obesity,  . Patient seen today to review palliative care needs to include medical decision making and advance care planning as appropriate.    History obtained from review of EMR, discussion with primary team, and interview with family, facility staff/caregiver and/or Cheyenne Frank.  I reviewed available labs, medications, imaging, studies and related documents from the EMR.  Records reviewed and summarized above.   ROS   General: NAD EYES: denies vision changes ENMT: denies dysphagia Cardiovascular: denies chest pain, denies DOE Pulmonary: denies cough, denies increased SOB Abdomen: endorses good appetite, denies constipation, endorses continence of bowel GU: denies dysuria, endorses continence of urine MSK:  denies  increased weakness,  no falls reported Skin: denies rashes or wounds Neurological: denies pain, endorses  insomnia Psych: Endorses positive mood  Physical Exam: Current and past weights: Constitutional:NAD General: frail appearing, obese  EYES: anicteric sclera, lids intact, no discharge  ENMT: intact hearing, oral mucous membranes moist, dentition intact CV: S1S2, RRR, no LE edema Pulmonary: LCTA, no increased work of breathing, no cough, room air Abdomen: intake 100%, normo-active BS + 4 quadrants, soft and non tender, no ascites MSK: no sarcopenia, moves all extremities, ambulatory Skin: warm and dry, no rashes or wounds on visible skin Neuro:  no generalized weakness,  no cognitive impairment, non-anxious affect   Thank you for the opportunity to participate in the care of Cheyenne Frank.  The palliative care team will continue to follow. Please call our office at [phone] if we can be of additional assistance.   Jason Coop, NP DNP, AGPCNP-BC  COVID-19 PATIENT SCREENING TOOL Asked and negative response unless otherwise noted:   Have you had symptoms of covid, tested positive or been in contact with someone with symptoms/positive test in the past 5-10 days?

## 2022-06-06 ENCOUNTER — Other Ambulatory Visit: Payer: Self-pay

## 2022-06-06 ENCOUNTER — Emergency Department: Payer: Medicare Other

## 2022-06-06 ENCOUNTER — Emergency Department
Admission: EM | Admit: 2022-06-06 | Discharge: 2022-06-06 | Disposition: A | Discharge status: Home / Self Care | Payer: Medicare Other | Attending: Emergency Medicine | Admitting: Emergency Medicine

## 2022-06-06 DIAGNOSIS — I4891 Unspecified atrial fibrillation: Secondary | ICD-10-CM | POA: Insufficient documentation

## 2022-06-06 DIAGNOSIS — J449 Chronic obstructive pulmonary disease, unspecified: Secondary | ICD-10-CM | POA: Insufficient documentation

## 2022-06-06 DIAGNOSIS — N3001 Acute cystitis with hematuria: Secondary | ICD-10-CM | POA: Diagnosis not present

## 2022-06-06 DIAGNOSIS — I509 Heart failure, unspecified: Secondary | ICD-10-CM | POA: Diagnosis not present

## 2022-06-06 DIAGNOSIS — R739 Hyperglycemia, unspecified: Secondary | ICD-10-CM | POA: Insufficient documentation

## 2022-06-06 DIAGNOSIS — N390 Urinary tract infection, site not specified: Secondary | ICD-10-CM

## 2022-06-06 DIAGNOSIS — I110 Hypertensive heart disease with heart failure: Secondary | ICD-10-CM | POA: Diagnosis not present

## 2022-06-06 DIAGNOSIS — R300 Dysuria: Secondary | ICD-10-CM | POA: Diagnosis present

## 2022-06-06 LAB — CBC WITH DIFFERENTIAL/PLATELET
Abs Immature Granulocytes: 0.04 10*3/uL (ref 0.00–0.07)
Basophils Absolute: 0 10*3/uL (ref 0.0–0.1)
Basophils Relative: 1 %
Eosinophils Absolute: 0.2 10*3/uL (ref 0.0–0.5)
Eosinophils Relative: 2 %
HCT: 35.5 % — ABNORMAL LOW (ref 36.0–46.0)
Hemoglobin: 11.4 g/dL — ABNORMAL LOW (ref 12.0–15.0)
Immature Granulocytes: 1 %
Lymphocytes Relative: 18 %
Lymphs Abs: 1.2 10*3/uL (ref 0.7–4.0)
MCH: 29.5 pg (ref 26.0–34.0)
MCHC: 32.1 g/dL (ref 30.0–36.0)
MCV: 91.7 fL (ref 80.0–100.0)
Monocytes Absolute: 0.5 10*3/uL (ref 0.1–1.0)
Monocytes Relative: 8 %
Neutro Abs: 4.6 10*3/uL (ref 1.7–7.7)
Neutrophils Relative %: 70 %
Platelets: 120 10*3/uL — ABNORMAL LOW (ref 150–400)
RBC: 3.87 MIL/uL (ref 3.87–5.11)
RDW: 13.4 % (ref 11.5–15.5)
WBC: 6.5 10*3/uL (ref 4.0–10.5)
nRBC: 0 % (ref 0.0–0.2)

## 2022-06-06 LAB — URINALYSIS, ROUTINE W REFLEX MICROSCOPIC
Bilirubin Urine: NEGATIVE
Glucose, UA: 250 mg/dL — AB
Ketones, ur: NEGATIVE mg/dL
Nitrite: POSITIVE — AB
Specific Gravity, Urine: 1.01 (ref 1.005–1.030)
pH: 5.5 (ref 5.0–8.0)

## 2022-06-06 LAB — COMPREHENSIVE METABOLIC PANEL
ALT: 15 U/L (ref 0–44)
AST: 21 U/L (ref 15–41)
Albumin: 3.2 g/dL — ABNORMAL LOW (ref 3.5–5.0)
Alkaline Phosphatase: 78 U/L (ref 38–126)
Anion gap: 9 (ref 5–15)
BUN: 46 mg/dL — ABNORMAL HIGH (ref 8–23)
CO2: 31 mmol/L (ref 22–32)
Calcium: 8.8 mg/dL — ABNORMAL LOW (ref 8.9–10.3)
Chloride: 97 mmol/L — ABNORMAL LOW (ref 98–111)
Creatinine, Ser: 1.72 mg/dL — ABNORMAL HIGH (ref 0.44–1.00)
GFR, Estimated: 30 mL/min — ABNORMAL LOW (ref >60)
Glucose, Bld: 432 mg/dL — ABNORMAL HIGH (ref 70–99)
Potassium: 4.4 mmol/L (ref 3.5–5.1)
Sodium: 137 mmol/L (ref 135–145)
Total Bilirubin: 0.6 mg/dL (ref 0.3–1.2)
Total Protein: 6.5 g/dL (ref 6.5–8.1)

## 2022-06-06 LAB — URINALYSIS, MICROSCOPIC (REFLEX)

## 2022-06-06 LAB — BASIC METABOLIC PANEL
Anion gap: 9 (ref 5–15)
BUN: 45 mg/dL — ABNORMAL HIGH (ref 8–23)
CO2: 27 mmol/L (ref 22–32)
Calcium: 8.6 mg/dL — ABNORMAL LOW (ref 8.9–10.3)
Chloride: 102 mmol/L (ref 98–111)
Creatinine, Ser: 1.51 mg/dL — ABNORMAL HIGH (ref 0.44–1.00)
GFR, Estimated: 35 mL/min — ABNORMAL LOW (ref >60)
Glucose, Bld: 344 mg/dL — ABNORMAL HIGH (ref 70–99)
Potassium: 4 mmol/L (ref 3.5–5.1)
Sodium: 138 mmol/L (ref 135–145)

## 2022-06-06 LAB — CBG MONITORING, ED
Glucose-Capillary: 390 mg/dL — ABNORMAL HIGH (ref 70–99)
Glucose-Capillary: 321 mg/dL — ABNORMAL HIGH (ref 70–99)

## 2022-06-06 MED ORDER — SODIUM CHLORIDE 0.9 % IV BOLUS
1000.0000 mL | Freq: Once | INTRAVENOUS | Status: AC
Start: 2022-06-06 — End: 2022-06-06
  Administered 2022-06-06: 1000 mL via INTRAVENOUS

## 2022-06-06 MED ORDER — SODIUM CHLORIDE 0.9 % IV SOLN
1.0000 g | Freq: Once | INTRAVENOUS | Status: AC
  Administered 2022-06-06: 1 g via INTRAVENOUS
  Filled 2022-06-06: qty 10

## 2022-06-06 MED ORDER — IPRATROPIUM-ALBUTEROL 0.5-2.5 (3) MG/3ML IN SOLN
3.0000 mL | Freq: Once | RESPIRATORY_TRACT | Status: AC
  Administered 2022-06-06: 3 mL via RESPIRATORY_TRACT
  Filled 2022-06-06: qty 3

## 2022-06-06 NOTE — Discharge Instructions (Signed)
You have a little UTI.  I will give you some antibiotics.  Keflex 1 pill 3 times a day and some antibiotics here.  The antibiotic I gave you tonight in the IV should last until you can get the antibiotics by mouth filled tomorrow and start taking them.  Your blood sugars are down now.  Continue your medicines.  I would do fingersticks at least 3-4 times a day until the UTI is better in 3 to 4 days.  Adjust your insulin to cover your high blood sugar.  Please follow-up with your doctor in the next couple days and have him check on your kidney function.  It was a little low today.  It improved with fluids and I think it will improve more if you continue drinking a lot of fluids for the next couple days.  I would drink 6 or 8 12 ounce glasses of water for the next 2 or 3 days.  Please return here for any worsening of your symptoms if you get any weaker or short of breath or run a fever or begin vomiting or have any other problems.  It would probably be safer if you stay but I think you can go home without any big risks at this time.

## 2022-06-06 NOTE — ED Notes (Signed)
This RN has called the patient's daughter Atha Starks who states she will be here in about 45 minutes to pick up the patient.

## 2022-06-06 NOTE — ED Triage Notes (Signed)
Pt presents to ED via [facility] with c/o of hyperglycemia an possible UTI per concerns of home health nurse. Pt denies fever or chills. Pt states she is not having any urinary s/s other than increased frequency. Pt is A&Ox4 at this time.   20G L hand.

## 2022-06-06 NOTE — ED Provider Notes (Signed)
[facility] Provider Note    Event Date/Time   First MD Initiated Contact with Patient 06/06/22 1517     (approximate)   History   Hyperglycemia   HPI  Cheyenne Frank is a 81 y.o. female who reports she was sent in because home health thought she had a UTI as she has been having urinary frequency urgency and possibly some dysuria although she is not sure about the dysuria.  She also reports her blood sugar was 500 today at home.  She has no other complaints.   Past medical history includes COPD CHF (diastolic) A-fib hypertension AKI etc.   Physical Exam   Triage Vital Signs: ED Triage Vitals  Enc Vitals Group     BP --      Pulse Rate 06/06/22 1509 63     Resp 06/06/22 1509 19     Temp 06/06/22 1509 97.8 F (36.6 C)     Temp Source 06/06/22 1509 Oral     SpO2 06/06/22 1509 100 %     Weight --      Height --      Head Circumference --      Peak Flow --      Pain Score 06/06/22 1516 0     Pain Loc --      Pain Edu? --      Excl. in [place]? --     Most recent vital signs: Vitals:   06/06/22 1900 06/06/22 2030  BP: (!) 172/124 (!) 114/44  Pulse: 72 76  Resp: 13 15  Temp:    SpO2: 100% 93%     General: Awake, no distress.  Head normocephalic atraumatic Mouth appears dry CV:  Good peripheral perfusion.  Heart regular rate and rhythm no audible murmurs Resp:  Normal effort.  Lungs are clear except for some scattered wheezing Abd:   distention.  Soft nontender Extremities no edema   ED Results / Procedures / Treatments   Labs (all labs ordered are listed, but only abnormal results are displayed) Labs Reviewed  CBC WITH DIFFERENTIAL/PLATELET - Abnormal; Notable for the following components:      Result Value   Hemoglobin 11.4 (*)    HCT 35.5 (*)    Platelets 120 (*)    All other components within normal limits  COMPREHENSIVE METABOLIC PANEL - Abnormal; Notable for the following components:   Chloride 97 (*)    Glucose, Bld 432  (*)    BUN 46 (*)    Creatinine, Ser 1.72 (*)    Calcium 8.8 (*)    Albumin 3.2 (*)    GFR, Estimated 30 (*)    All other components within normal limits  URINALYSIS, ROUTINE W REFLEX MICROSCOPIC - Abnormal; Notable for the following components:   Glucose, UA 250 (*)    Hgb urine dipstick MODERATE (*)    Protein, ur TRACE (*)    Nitrite POSITIVE (*)    Leukocytes,Ua TRACE (*)    All other components within normal limits  URINALYSIS, MICROSCOPIC (REFLEX) - Abnormal; Notable for the following components:   Bacteria, UA RARE (*)    All other components within normal limits  BASIC METABOLIC PANEL - Abnormal; Notable for the following components:   Glucose, Bld 344 (*)    BUN 45 (*)    Creatinine, Ser 1.51 (*)    Calcium 8.6 (*)    GFR, Estimated 35 (*)    All other components within normal limits  CBG MONITORING,  ED - Abnormal; Notable for the following components:   Glucose-Capillary 390 (*)    All other components within normal limits  CBG MONITORING, ED - Abnormal; Notable for the following components:   Glucose-Capillary 321 (*)    All other components within normal limits     EKG     RADIOLOGY    PROCEDURES:  Critical Care performed:   Procedures   MEDICATIONS ORDERED IN ED: Medications  ipratropium-albuterol (DUONEB) 0.5-2.5 (3) MG/3ML nebulizer solution 3 mL (3 mLs Nebulization Given 06/06/22 1604)  sodium chloride 0.9 % bolus 1,000 mL (1,000 mLs Intravenous Bolus 06/06/22 1607)  cefTRIAXone (ROCEPHIN) 1 g in sodium chloride 0.9 % 100 mL IVPB (0 g Intravenous Stopped 06/06/22 2038)     IMPRESSION / MDM / ASSESSMENT AND PLAN / ED COURSE  I reviewed the triage vital signs and the nursing notes. ----------------------------------------- 8:45 PM on 06/06/2022 ----------------------------------------- Patient is feeling better.  Her sugar is down.  Her GFR is somewhat improved with fluids.  I will give her some antibiotics.  I have offered her admission and  explained that that would be safer but she wants to go home.  I really cannot fault her for doing that.  I have told her she will need close follow-up and we will give her some Keflex antibiotic.  Differential diagnosis includes, but is not limited to, hypoglycemia caused by UTIs most likely thing.  Noncompliance with medications or noncompliance with diet or exercise could also put her sugar up.  Some other infection could also put her sugar but she does have a UTI and symptoms of a UTI with high blood sugar.  She does not have DKA.  Patient's presentation is most consistent with acute presentation with potential threat to life or bodily function.  The patient is on the cardiac monitor to evaluate for evidence of arrhythmia and/or significant heart rate changes.  None were seen.  Patient also has some mild AKI which improved with fluids.  Likely will improve still more with fluids.  We will try to rehydrate her by mouth and have her follow-up closely with her doctor which I explained to her.  FINAL CLINICAL IMPRESSION(S) / ED DIAGNOSES   Final diagnoses:  Hyperglycemia  Urinary tract infection with hematuria, site unspecified     Rx / DC Orders   ED Discharge Orders     None        Note:  This document was prepared using Dragon voice recognition software and may include unintentional dictation errors.   Arnaldo Natal, MD 06/06/22 2047

## 2022-07-16 ENCOUNTER — Other Ambulatory Visit: Payer: Self-pay

## 2022-07-16 DIAGNOSIS — N179 Acute kidney failure, unspecified: Secondary | ICD-10-CM | POA: Insufficient documentation

## 2022-07-16 DIAGNOSIS — Z7901 Long term (current) use of anticoagulants: Secondary | ICD-10-CM | POA: Diagnosis not present

## 2022-07-16 DIAGNOSIS — J45909 Unspecified asthma, uncomplicated: Secondary | ICD-10-CM | POA: Diagnosis not present

## 2022-07-16 DIAGNOSIS — Z993 Dependence on wheelchair: Secondary | ICD-10-CM | POA: Insufficient documentation

## 2022-07-16 DIAGNOSIS — E1122 Type 2 diabetes mellitus with diabetic chronic kidney disease: Secondary | ICD-10-CM | POA: Diagnosis not present

## 2022-07-16 DIAGNOSIS — Z66 Do not resuscitate: Secondary | ICD-10-CM | POA: Insufficient documentation

## 2022-07-16 DIAGNOSIS — Z794 Long term (current) use of insulin: Secondary | ICD-10-CM | POA: Diagnosis not present

## 2022-07-16 DIAGNOSIS — R531 Weakness: Secondary | ICD-10-CM | POA: Insufficient documentation

## 2022-07-16 DIAGNOSIS — G459 Transient cerebral ischemic attack, unspecified: Secondary | ICD-10-CM | POA: Diagnosis not present

## 2022-07-16 DIAGNOSIS — Z79899 Other long term (current) drug therapy: Secondary | ICD-10-CM | POA: Insufficient documentation

## 2022-07-16 DIAGNOSIS — I480 Paroxysmal atrial fibrillation: Secondary | ICD-10-CM | POA: Diagnosis not present

## 2022-07-16 DIAGNOSIS — R29810 Facial weakness: Secondary | ICD-10-CM | POA: Insufficient documentation

## 2022-07-16 DIAGNOSIS — F0394 Unspecified dementia, unspecified severity, with anxiety: Secondary | ICD-10-CM | POA: Insufficient documentation

## 2022-07-16 DIAGNOSIS — J449 Chronic obstructive pulmonary disease, unspecified: Secondary | ICD-10-CM | POA: Insufficient documentation

## 2022-07-16 DIAGNOSIS — Z6841 Body Mass Index (BMI) 40.0 and over, adult: Secondary | ICD-10-CM | POA: Insufficient documentation

## 2022-07-16 DIAGNOSIS — N1832 Chronic kidney disease, stage 3b: Secondary | ICD-10-CM | POA: Diagnosis not present

## 2022-07-16 DIAGNOSIS — I130 Hypertensive heart and chronic kidney disease with heart failure and stage 1 through stage 4 chronic kidney disease, or unspecified chronic kidney disease: Secondary | ICD-10-CM | POA: Diagnosis not present

## 2022-07-16 DIAGNOSIS — Z8673 Personal history of transient ischemic attack (TIA), and cerebral infarction without residual deficits: Secondary | ICD-10-CM | POA: Diagnosis not present

## 2022-07-16 DIAGNOSIS — R4182 Altered mental status, unspecified: Secondary | ICD-10-CM | POA: Diagnosis present

## 2022-07-16 DIAGNOSIS — Z7984 Long term (current) use of oral hypoglycemic drugs: Secondary | ICD-10-CM | POA: Insufficient documentation

## 2022-07-16 DIAGNOSIS — Z7982 Long term (current) use of aspirin: Secondary | ICD-10-CM | POA: Insufficient documentation

## 2022-07-16 DIAGNOSIS — Z96652 Presence of left artificial knee joint: Secondary | ICD-10-CM | POA: Diagnosis not present

## 2022-07-16 DIAGNOSIS — F0390 Unspecified dementia without behavioral disturbance: Secondary | ICD-10-CM | POA: Diagnosis not present

## 2022-07-16 DIAGNOSIS — I5032 Chronic diastolic (congestive) heart failure: Secondary | ICD-10-CM | POA: Diagnosis not present

## 2022-07-16 LAB — COMPREHENSIVE METABOLIC PANEL
ALT: 16 U/L (ref 0–44)
AST: 23 U/L (ref 15–41)
Albumin: 3.2 g/dL — ABNORMAL LOW (ref 3.5–5.0)
Alkaline Phosphatase: 77 U/L (ref 38–126)
Anion gap: 10 (ref 5–15)
BUN: 52 mg/dL — ABNORMAL HIGH (ref 8–23)
CO2: 29 mmol/L (ref 22–32)
Calcium: 8.8 mg/dL — ABNORMAL LOW (ref 8.9–10.3)
Chloride: 101 mmol/L (ref 98–111)
Creatinine, Ser: 2 mg/dL — ABNORMAL HIGH (ref 0.44–1.00)
GFR, Estimated: 25 mL/min — ABNORMAL LOW (ref >60)
Glucose, Bld: 162 mg/dL — ABNORMAL HIGH (ref 70–99)
Potassium: 4.4 mmol/L (ref 3.5–5.1)
Sodium: 140 mmol/L (ref 135–145)
Total Bilirubin: 0.6 mg/dL (ref 0.3–1.2)
Total Protein: 6.5 g/dL (ref 6.5–8.1)

## 2022-07-16 LAB — CBC
HCT: 35.1 % — ABNORMAL LOW (ref 36.0–46.0)
Hemoglobin: 11.3 g/dL — ABNORMAL LOW (ref 12.0–15.0)
MCH: 29.2 pg (ref 26.0–34.0)
MCHC: 32.2 g/dL (ref 30.0–36.0)
MCV: 90.7 fL (ref 80.0–100.0)
Platelets: 143 10*3/uL — ABNORMAL LOW (ref 150–400)
RBC: 3.87 MIL/uL (ref 3.87–5.11)
RDW: 13.4 % (ref 11.5–15.5)
WBC: 11.1 10*3/uL — ABNORMAL HIGH (ref 4.0–10.5)
nRBC: 0 % (ref 0.0–0.2)

## 2022-07-16 LAB — URINALYSIS, ROUTINE W REFLEX MICROSCOPIC
Bilirubin Urine: NEGATIVE
Glucose, UA: NEGATIVE mg/dL
Ketones, ur: NEGATIVE mg/dL
Leukocytes,Ua: NEGATIVE
Nitrite: NEGATIVE
Protein, ur: 30 mg/dL — AB
Specific Gravity, Urine: 1.009 (ref 1.005–1.030)
pH: 5 (ref 5.0–8.0)

## 2022-07-16 LAB — CBG MONITORING, ED: Glucose-Capillary: 152 mg/dL — ABNORMAL HIGH (ref 70–99)

## 2022-07-16 NOTE — ED Triage Notes (Signed)
Pt bib ems from home. Per pt "I was brought in because they said I was lethargic". Pt denies pain, no chest pain/no SOB, no falls. H/O Diabetes  Pt alert to self and place, slow to respond while in triage.

## 2022-07-16 NOTE — ED Triage Notes (Signed)
First nurse note: Patient arrived by EMS from home. Family reports difficult to wake up that started around 1:30pm. They report she has history of UTIs and normally presents like this when one is starting  209 CBG 100/75 b/p 96% RA 82HR 98.4oral  18G L wrist

## 2022-07-17 ENCOUNTER — Encounter: Payer: Self-pay | Admitting: Internal Medicine

## 2022-07-17 ENCOUNTER — Emergency Department: Payer: Medicare Other

## 2022-07-17 ENCOUNTER — Observation Stay
Admission: EM | Admit: 2022-07-17 | Discharge: 2022-07-18 | Disposition: A | Discharge status: Home Health | Payer: Medicare Other | Attending: Internal Medicine | Admitting: Internal Medicine

## 2022-07-17 ENCOUNTER — Other Ambulatory Visit: Payer: Self-pay

## 2022-07-17 DIAGNOSIS — R4182 Altered mental status, unspecified: Principal | ICD-10-CM

## 2022-07-17 DIAGNOSIS — Z8673 Personal history of transient ischemic attack (TIA), and cerebral infarction without residual deficits: Secondary | ICD-10-CM

## 2022-07-17 DIAGNOSIS — N179 Acute kidney failure, unspecified: Secondary | ICD-10-CM

## 2022-07-17 DIAGNOSIS — I639 Cerebral infarction, unspecified: Secondary | ICD-10-CM | POA: Insufficient documentation

## 2022-07-17 DIAGNOSIS — E1165 Type 2 diabetes mellitus with hyperglycemia: Secondary | ICD-10-CM

## 2022-07-17 DIAGNOSIS — I5032 Chronic diastolic (congestive) heart failure: Secondary | ICD-10-CM | POA: Diagnosis present

## 2022-07-17 DIAGNOSIS — N183 Chronic kidney disease, stage 3 unspecified: Secondary | ICD-10-CM | POA: Diagnosis present

## 2022-07-17 DIAGNOSIS — E1122 Type 2 diabetes mellitus with diabetic chronic kidney disease: Secondary | ICD-10-CM | POA: Diagnosis present

## 2022-07-17 DIAGNOSIS — F0390 Unspecified dementia without behavioral disturbance: Secondary | ICD-10-CM | POA: Diagnosis present

## 2022-07-17 DIAGNOSIS — G40909 Epilepsy, unspecified, not intractable, without status epilepticus: Secondary | ICD-10-CM

## 2022-07-17 DIAGNOSIS — I5033 Acute on chronic diastolic (congestive) heart failure: Secondary | ICD-10-CM | POA: Diagnosis present

## 2022-07-17 DIAGNOSIS — I480 Paroxysmal atrial fibrillation: Secondary | ICD-10-CM | POA: Diagnosis present

## 2022-07-17 DIAGNOSIS — G459 Transient cerebral ischemic attack, unspecified: Secondary | ICD-10-CM | POA: Diagnosis not present

## 2022-07-17 LAB — CBG MONITORING, ED
Glucose-Capillary: 104 mg/dL — ABNORMAL HIGH (ref 70–99)
Glucose-Capillary: 191 mg/dL — ABNORMAL HIGH (ref 70–99)
Glucose-Capillary: 176 mg/dL — ABNORMAL HIGH (ref 70–99)

## 2022-07-17 LAB — HEMOGLOBIN A1C
Hgb A1c MFr Bld: 9.1 % — ABNORMAL HIGH (ref 4.8–5.6)
Mean Plasma Glucose: 214.47 mg/dL

## 2022-07-17 MED ORDER — ACETAMINOPHEN 500 MG PO TABS: 500.0000 mg | ORAL_TABLET | Freq: Four times a day (QID) | ORAL | Status: DC | PRN

## 2022-07-17 MED ORDER — INSULIN GLARGINE-YFGN 100 UNIT/ML ~~LOC~~ SOLN
50.0000 [IU] | Freq: Every day | SUBCUTANEOUS | Status: DC
  Administered 2022-07-17: 50 [IU] via SUBCUTANEOUS
  Filled 2022-07-17 (×2): qty 0.5

## 2022-07-17 MED ORDER — GABAPENTIN 300 MG PO CAPS
300.0000 mg | ORAL_CAPSULE | Freq: Two times a day (BID) | ORAL | Status: DC
  Administered 2022-07-17 – 2022-07-18 (×2): 300 mg via ORAL
  Filled 2022-07-17 (×2): qty 1

## 2022-07-17 MED ORDER — ACETAMINOPHEN 325 MG PO TABS: 650.0000 mg | ORAL_TABLET | Freq: Four times a day (QID) | ORAL | Status: DC | PRN

## 2022-07-17 MED ORDER — APIXABAN 2.5 MG PO TABS
2.5000 mg | ORAL_TABLET | Freq: Two times a day (BID) | ORAL | Status: DC
  Administered 2022-07-17 – 2022-07-18 (×3): 2.5 mg via ORAL
  Filled 2022-07-17 (×3): qty 1

## 2022-07-17 MED ORDER — ROSUVASTATIN CALCIUM 5 MG PO TABS
2.5000 mg | ORAL_TABLET | Freq: Every day | ORAL | Status: DC
  Administered 2022-07-17: 2.5 mg via ORAL
  Filled 2022-07-17: qty 0.5

## 2022-07-17 MED ORDER — SODIUM CHLORIDE 0.9 % IV BOLUS (SEPSIS)
1000.0000 mL | Freq: Once | INTRAVENOUS | Status: AC
  Administered 2022-07-17: 1000 mL via INTRAVENOUS

## 2022-07-17 MED ORDER — TRAZODONE HCL 50 MG PO TABS
50.0000 mg | ORAL_TABLET | Freq: Every day | ORAL | Status: DC
  Administered 2022-07-17: 50 mg via ORAL
  Filled 2022-07-17: qty 1

## 2022-07-17 MED ORDER — ADULT MULTIVITAMIN W/MINERALS CH
1.0000 | ORAL_TABLET | Freq: Every day | ORAL | Status: DC
  Administered 2022-07-17 – 2022-07-18 (×2): 1 via ORAL
  Filled 2022-07-17 (×2): qty 1

## 2022-07-17 MED ORDER — ALBUTEROL SULFATE (2.5 MG/3ML) 0.083% IN NEBU
3.0000 mL | INHALATION_SOLUTION | Freq: Four times a day (QID) | RESPIRATORY_TRACT | Status: DC | PRN
  Administered 2022-07-17: 3 mL via RESPIRATORY_TRACT
  Filled 2022-07-17: qty 3

## 2022-07-17 MED ORDER — CARVEDILOL 6.25 MG PO TABS
25.0000 mg | ORAL_TABLET | Freq: Two times a day (BID) | ORAL | Status: DC
  Administered 2022-07-17 – 2022-07-18 (×3): 25 mg via ORAL
  Filled 2022-07-17 (×3): qty 4

## 2022-07-17 MED ORDER — INSULIN ASPART 100 UNIT/ML IJ SOLN
0.0000 [IU] | Freq: Three times a day (TID) | INTRAMUSCULAR | Status: DC
  Administered 2022-07-17 – 2022-07-18 (×3): 3 [IU] via SUBCUTANEOUS
  Filled 2022-07-17 (×3): qty 1

## 2022-07-17 MED ORDER — ACETAMINOPHEN 650 MG RE SUPP: 650.0000 mg | Freq: Four times a day (QID) | RECTAL | Status: DC | PRN

## 2022-07-17 MED ORDER — ASPIRIN 81 MG PO TBEC
81.0000 mg | DELAYED_RELEASE_TABLET | Freq: Every day | ORAL | Status: DC
  Administered 2022-07-17 – 2022-07-18 (×2): 81 mg via ORAL
  Filled 2022-07-17 (×2): qty 1

## 2022-07-17 MED ORDER — NYSTATIN 100000 UNIT/GM EX POWD
Freq: Two times a day (BID) | CUTANEOUS | Status: DC
  Filled 2022-07-17: qty 15

## 2022-07-17 MED ORDER — PANTOPRAZOLE SODIUM 40 MG PO TBEC
40.0000 mg | DELAYED_RELEASE_TABLET | Freq: Every day | ORAL | Status: DC
  Administered 2022-07-17 – 2022-07-18 (×2): 40 mg via ORAL
  Filled 2022-07-17 (×2): qty 1

## 2022-07-17 MED ORDER — OXYBUTYNIN CHLORIDE ER 10 MG PO TB24
10.0000 mg | ORAL_TABLET | Freq: Every day | ORAL | Status: DC
  Administered 2022-07-17 – 2022-07-18 (×2): 10 mg via ORAL
  Filled 2022-07-17 (×2): qty 1

## 2022-07-17 MED ORDER — SODIUM CHLORIDE 0.9 % IV SOLN: INTRAVENOUS | Status: AC

## 2022-07-17 MED ORDER — PAROXETINE HCL 20 MG PO TABS
40.0000 mg | ORAL_TABLET | Freq: Every day | ORAL | Status: DC
  Administered 2022-07-17: 40 mg via ORAL
  Filled 2022-07-17: qty 2

## 2022-07-17 NOTE — H&P (Signed)
History and Physical    Patient: Cheyenne Frank LOV:[id number] DOB: 1940/12/16 DOA: 07/17/2022 DOS: the patient was seen and examined on 07/17/2022 PCP: [facility]  Patient coming from: Home  Chief Complaint:  Chief Complaint  Patient presents with   Altered Mental Status    Most of the history is obtained from patient's daughter Ms [name] over the phone  HPI: Cheyenne Frank is a 81 y.o. female with medical history significant for diabetes mellitus with complications of stage III chronic kidney disease, morbid obesity, history of CVA, hypertension, diverticulosis, chronic diastolic dysfunction CHF who was brought into the ER by EMS for evaluation of concerns of a possible stroke. Per family they were unable to arouse patient at about 1:30 PM and then noted some left-sided weakness and a left facial droop so EMS was called. Patient's symptoms resolved in the ER but family notes that she appeared more confused compared to her baseline. During my evaluation patient was oriented to person and place. She denies having any headache, no dizziness, no lightheadedness, no nausea, no vomiting, no abdominal pain, no chest pain, no cough, no fever, no chills, no blurred vision no focal deficit. She had a CT scan of the head done without contrast which showed Left occipital infarct which appears chronic but is new since prior study. No acute intracranial abnormality. MRI of the brain showed no acute finding. 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 She will be admitted to the hospital for further evaluation   Review of Systems: As mentioned in the history of present illness. All other systems reviewed and are negative. Past Medical History:  Diagnosis Date   AKI (acute kidney injury) (HCC)    Anemia of chronic disease    Anxiety    Arthritis    Asthma    no problems since 1999 (after moving into a new home)   Chronic airway obstruction (HCC)     Complication of anesthesia    " feel like I'm dying after I wake up"   Diabetes mellitus without complication (HCC)    Diverticulosis    Dysrhythmia    unknown type    Fibromyalgia    Flat back syndrome    GERD (gastroesophageal reflux disease)    Hx of transfusion 2011   Hypertension    Itching    Macular degeneration    Nocturia    Postoperative anemia due to acute blood loss 04/09/2013   Restless leg syndrome    Sleep apnea    Dx 1990's unable to wear c-pap -   Stroke (HCC) 2010   verbal aphasia x 30 min - resolved - no problem since then   Past Surgical History:  Procedure Laterality Date   ABDOMINAL HYSTERECTOMY     rt so   BACK SURGERY  2010 / 2011   BREAST REDUCTION SURGERY     BURR HOLE FOR SUBDURAL HEMATOMA  2015   cataracts removed     CHOLECYSTECTOMY  1990   DILATION AND CURETTAGE OF UTERUS     HERNIA REPAIR     POSTERIOR LAMINECTOMY / DECOMPRESSION LUMBAR SPINE     with T10-S1 fusion   TONSILLECTOMY     TOTAL KNEE ARTHROPLASTY Left 04/06/2013   Procedure: LEFT TOTAL KNEE ARTHROPLASTY;  Surgeon: Loanne Drilling, MD;  Location: [facility];  Service: Orthopedics;  Laterality: Left;   Social History:  reports that she has never smoked. She has never used smokeless tobacco. She reports that she  does not drink alcohol and does not use drugs.  Allergies  Allergen Reactions   Codeine Palpitations    Other Reaction: RACES HEART   Dilaudid [Hydromorphone] Other (See Comments) and Rash    Other Reaction: lightheaded, altered ms, nervous, Paranoia paranoid Other Reaction: lightheaded, altered ms, nervous, Paranoia   Demerol [Meperidine] Other (See Comments) and Rash    agitated Agitation   Pravastatin Other (See Comments)    Muscle aches   Amiodarone Other (See Comments)    Per family pt had twitching while taking and cardiologist took pt off    Lipitor [Atorvastatin] Other (See Comments) and Itching    unknown   Nifedipine Other (See Comments)    unknown    Statins Rash    Makes her crazy   Sulfa Antibiotics Rash   Vancomycin Rash    deafness    Family History  Problem Relation Age of Onset   COPD Sister        had lung transplant   COPD Brother     Prior to Admission medications   Medication Sig Start Date End Date Taking? Authorizing Provider  acetaminophen (TYLENOL) 500 MG tablet Take 500-1,000 mg by mouth every 6 (six) hours as needed for mild pain or fever.     [provider]  albuterol (VENTOLIN HFA) 108 (90 Base) MCG/ACT inhaler Inhale 2 puffs into the lungs every 6 (six) hours as needed for wheezing or shortness of breath. 05/19/20   [provider]  apixaban (ELIQUIS) 5 MG TABS tablet Take 1 tablet (5 mg total) by mouth 2 (two) times daily. 05/22/21   Burnadette Pop, MD  aspirin EC 81 MG tablet Take 1 tablet (81 mg total) by mouth daily. MAY RESUME ON 12/31/20 Patient taking differently: Take 81 mg by mouth daily. 12/31/20   Osvaldo Shipper, MD  carvedilol (COREG) 25 MG tablet Take 25 mg by mouth 2 (two) times daily.     [provider]  gabapentin (NEURONTIN) 300 MG capsule Take 1 capsule (300 mg total) by mouth 2 (two) times daily. 02/07/22   Rhetta Mura, MD  hydrALAZINE (APRESOLINE) 25 MG tablet Take 1 tablet (25 mg total) by mouth 3 (three) times daily as needed (For systolic above 160). Patient not taking: Reported on 02/04/2022 10/30/21   Arnetha Courser, MD  insulin glargine (LANTUS) 100 UNIT/ML injection Inject 0.35 mLs (35 Units total) into the skin at bedtime. Patient taking differently: Inject 72 Units into the skin at bedtime. 02/02/22   Marrion Coy, MD  insulin lispro (HUMALOG) 100 UNIT/ML injection Inject 0.08 mLs (8 Units total) into the skin 3 (three) times daily with meals. Patient taking differently: Inject 22 Units into the skin 3 (three) times daily with meals. 02/02/22   Marrion Coy, MD  losartan (COZAAR) 100 MG tablet Take 100 mg by mouth daily. 05/07/22   [provider]   losartan (COZAAR) 25 MG tablet Take 25 mg by mouth daily. 07/03/22   [provider]  losartan (COZAAR) 50 MG tablet Take 1 tablet (50 mg total) by mouth daily. 02/07/22   Rhetta Mura, MD  Multiple Vitamin (MULTIVITAMIN WITH MINERALS) TABS tablet Take 1 tablet by mouth daily.    [provider]  omeprazole (PRILOSEC) 40 MG capsule Take 40 mg by mouth daily.     [provider]  oxybutynin (DITROPAN-XL) 10 MG 24 hr tablet Take 10 mg by mouth daily. 10/09/21   [provider]  PARoxetine (PAXIL) 40 MG tablet Take 1  tablet (40 mg total) by mouth at bedtime. 02/07/22   Rhetta Mura, MD  rosuvastatin (CRESTOR) 5 MG tablet Take 2.5 mg by mouth at bedtime.    [provider]  sitaGLIPtin (JANUVIA) 50 MG tablet Take 50 mg by mouth daily.    [provider]  torsemide (DEMADEX) 20 MG tablet Take 1 tablet (20 mg total) by mouth daily. Please start from 11/01/2021 10/30/21   Arnetha Courser, MD  traZODone (DESYREL) 50 MG tablet Take 1 tablet (50 mg total) by mouth at bedtime. 02/07/22   Rhetta Mura, MD    Physical Exam: Vitals:   07/16/22 1608 07/16/22 1612 07/17/22 0017 07/17/22 0525  BP:  (!) 105/52 (!) 128/42   Pulse:  77 77   Resp:  17 18   Temp:  98.5 F (36.9 C) 97.8 F (36.6 C) 97.9 F (36.6 C)  TempSrc:  Oral Oral Oral  SpO2:  93% 96%   Weight: 112.9 kg     Height: 5\' 6"  (1.676 m)      Physical Exam Vitals and nursing note reviewed.  Constitutional:      Appearance: She is obese.     Comments: Sleeping but arouses easily  HENT:     Head: Normocephalic and atraumatic.     Nose: Nose normal.     Mouth/Throat:     Mouth: Mucous membranes are moist.  Eyes:     Conjunctiva/sclera: Conjunctivae normal.  Cardiovascular:     Rate and Rhythm: Normal rate and regular rhythm.  Pulmonary:     Effort: Pulmonary effort is normal.     Breath sounds: Normal breath sounds.  Abdominal:     General: Abdomen is flat.  Bowel sounds are normal.     Palpations: Abdomen is soft.     Comments: Central adiposity  Musculoskeletal:        General: Normal range of motion.     Cervical back: Normal range of motion and neck supple.  Skin:    General: Skin is warm and dry.  Neurological:     Comments: Able to move all extremities and follow simple commands.  Oriented to person and place.  Psychiatric:        Mood and Affect: Mood normal.        Behavior: Behavior normal.     Data Reviewed: Relevant notes from primary care and specialist visits, past discharge summaries as available in EHR, including Care Everywhere. Prior diagnostic testing as pertinent to current admission diagnoses Updated medications and problem lists for reconciliation ED course, including vitals, labs, imaging, treatment and response to treatment Triage notes, nursing and pharmacy notes and ED provider's notes Notable results as noted in HPI Labs reviewed.  Sodium 140, potassium 4.4, chloride 101, bicarb 29, glucose 162, BUN 52, creatinine 2.0, calcium 8.8, total protein 6.5, albumin 3.2, AST 23, ALT 16, alkaline phosphatase 77, total bilirubin 0.6, white count 11, hemoglobin 11.3, hematocrit 35.1, platelet count 143 12-lead EKG reviewed by me shows sinus rhythm with first-degree AV block.  Low voltage QRS. There are no new results to review at this time.  Assessment and Plan: * TIA (transient ischemic attack) Patient with a history of a prior CVA who presents to the ER for evaluation of mental status changes, left-sided weakness and left-sided facial droop. MRI of the brain was done which showed No acute finding. 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 Patient has a history of A-fib and is on chronic anticoagulation with Eliquis Continue aspirin and  statins  AKI (acute kidney injury) ([place]) At baseline patient has a serum creatinine of 1.5 and today on admission it is 2.0 with elevated BUN We will hydrate patient Hold  losartan and torsemide for now Repeat renal parameters in a.m.  Paroxysmal atrial fibrillation (HCC) Continue carvedilol for rate control Continue apixaban as secondary prophylaxis for an acute stroke  Chronic diastolic CHF (congestive heart failure) ([place]) Patient has chronic diastolic dysfunction CHF with last known LVEF of 60 to 65% from a 2D echocardiogram which was done 02/23 Not acutely exacerbated Continue carvedilol Hold losartan and torsemide due to worsening renal function   CKD stage 3 due to type 2 diabetes mellitus ([place]) Patient has diabetes mellitus with complications of stage IIIb chronic kidney disease. Maintain consistent carbohydrate diet Sliding scale with NovoLog insulin  Dementia without behavioral disturbance (HCC) Stable Continue Paxil  Seizure disorder [facility]) Patient was hospitalized in 05/23 and was transferred to [facility] for continuous EEG which did not show any evidence of seizures and was within normal limits She was discharged on Keppra and advised to follow-up with neurology to discuss  duration of therapy. Discussed with patient's daughter who states that she was unable to tolerate Keppra and they have self discontinued it. Keppra has been discontinued on patient's med rec  History of CVA (cerebrovascular accident) Patient has a history of a prior CVA and at baseline has mild cognitive impairment and is wheelchair-bound but able to ambulate short distances per daughter. Continue aspirin and statin   Obesity, Class III, BMI 40-49.9 (morbid obesity) (HCC) BMI 40 Complicates overall prognosis and care      Advance Care Planning:   Code Status: DNR   Consults: Speech therapy, physical therapy,  Family Communication: Greater than 50% of time was spent discussing patient's condition and plan of care with her daughter Ms. Wilson over the phone.  All questions and concerns have been addressed.  CODE STATUS was discussed and patient is a DO  NOT RESUSCITATE.  Severity of Illness: The appropriate patient status for this patient is INPATIENT. Inpatient status is judged to be reasonable and necessary in order to provide the required intensity of service to ensure the patient's safety. The patient's presenting symptoms, physical exam findings, and initial radiographic and laboratory data in the context of their chronic comorbidities is felt to place them at high risk for further clinical deterioration. Furthermore, it is not anticipated that the patient will be medically stable for discharge from the hospital within 2 midnights of admission.   * I certify that at the point of admission it is my clinical judgment that the patient will require inpatient hospital care spanning beyond 2 midnights from the point of admission due to high intensity of service, high risk for further deterioration and high frequency of surveillance required.*  Author: Collier Bullock, MD 07/17/2022 10:35 AM  For on call review [email or web address].

## 2022-07-17 NOTE — Evaluation (Signed)
Physical Therapy Evaluation Patient Details Name: Cheyenne Frank MRN: [id number] DOB: 07/26/1941 Today's Date: 07/17/2022  History of Present Illness  Pt is an 81 y/o F admitted on 07/17/22 after presenting on 07/16/22 with c/o family unable to arouse pt & then noted L sided weakness & L facial droop. CT scan of the head done without contrast which showed Left occipital infarct which appears chronic but is new since prior study. MRI of the brain showed no acute finding. 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 Pt is being treated for TIA. PMH: DM, CKD3, morbid obesity, CVA, HTN, diverticulosis, chronic diastolic dysfunction CHF  Clinical Impression  Pt seen for PT evaluation with pt received almost asleep in bed but awakens with encouragement. Pt requires mod assist with heavy reliance on hospital bed features to complete supine>sit, but transitions sit>supine with supervision. Pt is able to transfer STS with min assist but relies on UE support to ambulate. Provided pt with RW & pt ambulated into hallway with RW & min assist with min assist to manage RW in small spaces in room. Pt endorsed fatigue & assisted back to bed at end of session.  Spoke to pt's daughter [name]) via telephone, who reports pt lives with pt's other daughter. Pt primarily uses w/c but has been working on ambulation with HHPT once/week. Pt requires assistance for all mobility & ADL's & family assists with this. Pt's cognition has been worsening in the past few months & on this date does not appear far from baseline. Anticipate pt can return home with family assistance & HHPT f/u.     Recommendations for follow up therapy are one component of a multi-disciplinary discharge planning process, led by the attending physician.  Recommendations may be updated based on patient status, additional functional criteria and insurance authorization.  Follow Up Recommendations Home health PT      Assistance Recommended at  Discharge Frequent or constant Supervision/Assistance  Patient can return home with the following  A lot of help with walking and/or transfers;A lot of help with bathing/dressing/bathroom;Assist for transportation;Help with stairs or ramp for entrance;Direct supervision/assist for medications management;Assistance with cooking/housework;Direct supervision/assist for financial management;Assistance with feeding    Equipment Recommendations None recommended by PT  Recommendations for Other Services  OT consult    Functional Status Assessment Patient has had a recent decline in their functional status and demonstrates the ability to make significant improvements in function in a reasonable and predictable amount of time.     Precautions / Restrictions Precautions Precautions: Fall Restrictions Weight Bearing Restrictions: No      Mobility  Bed Mobility Overal bed mobility: Needs Assistance Bed Mobility: Supine to Sit, Sit to Supine     Supine to sit: Mod assist, HOB elevated Sit to supine: Supervision, HOB elevated   General bed mobility comments: Pt requires max cuing/extra time to initiate supine>sit with hospital bed features, assistance to upright trunk. Pt is able to complete sit>supine with supervision. Pt scoots to [facility] with bed in trendelenburg position with extra time/education re: technique.    Transfers Overall transfer level: Needs assistance Equipment used: Rolling walker (2 wheels) Transfers: Sit to/from Stand Sit to Stand: Min assist           General transfer comment: STS from EOB with RW & min assist, poor awareness of safe hand placement during STS with RW    Ambulation/Gait Ambulation/Gait assistance: Min assist Gait Distance (Feet): 30 Feet Assistive device: Rolling walker (2 wheels) Gait Pattern/deviations:  Decreased step length - right, Decreased dorsiflexion - left, Decreased dorsiflexion - right, Decreased step length - left, Decreased stride  length Gait velocity: decreased     General Gait Details: Pushes RW out in front of her, extra time & min assist to manage RW in small space in room.  Stairs            Wheelchair Mobility    Modified Rankin (Stroke Patients Only)       Balance Overall balance assessment: Needs assistance Sitting-balance support: Feet supported Sitting balance-Leahy Scale: Fair Sitting balance - Comments: supervision static sitting EOB   Standing balance support: During functional activity, Bilateral upper extremity supported Standing balance-Leahy Scale: Poor                               Pertinent Vitals/Pain Pain Assessment Pain Assessment: Faces Faces Pain Scale: No hurt    Home Living Family/patient expects to be discharged to:: Private residence Living Arrangements: Children Available Help at Discharge: Family;Available 24 hours/day Type of Home: House Home Access: Ramped entrance       Home Layout: Two level;Able to live on main level with bedroom/bathroom Home Equipment: Rolling Walker (2 wheels);Wheelchair - manual;BSC/3in1;Shower seat - built in;Other (comment) Additional Comments: Hospital bed, trapeze bar,    Prior Function Prior Level of Function : Needs assist             Mobility Comments: daughter reports pt primarily uses w/c at home, has been working on walking with RW & HHPT once/week, requires assistance for stand pivot transfers, intermittent assistance for bed mobility even with hospital bed & trapeze bar ADLs Comments: wears depends, assistance for peri hygiene & bathing/dressing.     Hand Dominance        Extremity/Trunk Assessment   Upper Extremity Assessment Upper Extremity Assessment: Generalized weakness;Difficult to assess due to impaired cognition    Lower Extremity Assessment Lower Extremity Assessment: Generalized weakness;Difficult to assess due to impaired cognition       Communication   Communication: HOH   Cognition Arousal/Alertness:  (fatigued, required encouragement to increase alertness) Behavior During Therapy: Flat affect Overall Cognitive Status: History of cognitive impairments - at baseline                                 General Comments: Daughter reports pt orientation has worsened in the last few months. Pt is able to recall her name & age but not her birthday & daughter reports this is close to baseline.        General Comments      Exercises     Assessment/Plan    PT Assessment Patient needs continued PT services  PT Problem List Decreased strength;Decreased mobility;Decreased safety awareness;Decreased activity tolerance;Decreased balance;Decreased knowledge of use of DME;Decreased cognition;Decreased coordination       PT Treatment Interventions Therapeutic exercise;DME instruction;Gait training;Functional mobility training;Cognitive remediation;Balance training;Neuromuscular re-education;Therapeutic activities;Patient/family education    PT Goals (Current goals can be found in the Care Plan section)  Acute Rehab PT Goals Patient Stated Goal: none stated PT Goal Formulation: Patient unable to participate in goal setting Time For Goal Achievement: 07/31/22 Potential to Achieve Goals: Fair    Frequency Min 2X/week     Co-evaluation               AM-PAC PT "6 Clicks" Mobility  Outcome Measure Help needed  turning from your back to your side while in a flat bed without using bedrails?: A Little Help needed moving from lying on your back to sitting on the side of a flat bed without using bedrails?: Total Help needed moving to and from a bed to a chair (including a wheelchair)?: A Lot Help needed standing up from a chair using your arms (e.g., wheelchair or bedside chair)?: A Little Help needed to walk in hospital room?: A Lot Help needed climbing 3-5 steps with a railing? : Total 6 Click Score: 12    End of Session   Activity Tolerance:  Patient tolerated treatment well;Patient limited by fatigue Patient left: in bed;with call bell/phone within reach;with bed alarm set   PT Visit Diagnosis: Unsteadiness on feet (R26.81);Muscle weakness (generalized) (M62.81)    Time: 8882-8003 PT Time Calculation (min) (ACUTE ONLY): 21 min   Charges:   PT Evaluation $PT Eval Moderate Complexity: [place], PT, DPT 07/17/22, 3:56 PM   Waunita Schooner 07/17/2022, 3:54 PM

## 2022-07-17 NOTE — Assessment & Plan Note (Signed)
Patient was hospitalized in 05/23 and was transferred to [facility] for continuous EEG which did not show any evidence of seizures and was within normal limits She was discharged on Keppra and advised to follow-up with neurology to discuss  duration of therapy. Discussed with patient's daughter who states that she was unable to tolerate Keppra and they have self discontinued it. Keppra has been discontinued on patient's med rec

## 2022-07-17 NOTE — Assessment & Plan Note (Signed)
Stable Continue Paxil 

## 2022-07-17 NOTE — Assessment & Plan Note (Signed)
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 Maintain consistent carbohydrate diet Sliding scale with NovoLog insulin

## 2022-07-17 NOTE — Assessment & Plan Note (Signed)
Patient with a history of a prior CVA who presents to the ER for evaluation of mental status changes, left-sided weakness and left-sided facial droop. MRI of the brain was done which showed No acute finding. 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 Patient has a history of A-fib and is on chronic anticoagulation with Eliquis Continue aspirin and statins

## 2022-07-17 NOTE — Assessment & Plan Note (Signed)
At baseline patient has a serum creatinine of 1.5 and today on admission it is 2.0 with elevated BUN We will hydrate patient Hold losartan and torsemide for now Repeat renal parameters in a.m.

## 2022-07-17 NOTE — Progress Notes (Signed)
[place] [facility]) Hospital Liaison note:  This patient is currently enrolled in [facility] outpatient-based Palliative Care. Will continue to follow for disposition.  Please call with any outpatient palliative questions or concerns.  Thank you, Lorelee Market, LPN [facility] Liaison [phone]

## 2022-07-17 NOTE — Assessment & Plan Note (Signed)
Continue carvedilol for rate control Continue apixaban as secondary prophylaxis for an acute stroke

## 2022-07-17 NOTE — Assessment & Plan Note (Signed)
BMI 40 Complicates overall prognosis and care

## 2022-07-17 NOTE — Assessment & Plan Note (Signed)
Patient has chronic diastolic dysfunction CHF with last known LVEF of 60 to 65% from a 2D echocardiogram which was done 02/23 Not acutely exacerbated Continue carvedilol Hold losartan and torsemide due to worsening renal function

## 2022-07-17 NOTE — Evaluation (Signed)
Occupational Therapy Evaluation Patient Details Name: Cheyenne Frank MRN: [id number] DOB: July 31, 1941 Today's Date: 07/17/2022   History of Present Illness Pt is an 81 y/o F admitted on 07/17/22 after presenting on 07/16/22 with c/o family unable to arouse pt & then noted L sided weakness & L facial droop. CT scan of the head done without contrast which showed Left occipital infarct which appears chronic but is new since prior study. MRI of the brain showed no acute finding. 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 Pt is being treated for TIA. PMH: DM, CKD3, morbid obesity, CVA, HTN, diverticulosis, chronic diastolic dysfunction CHF   Clinical Impression   Patient seen for OT evaluation. Pt presenting with decreased independence in self care, balance, functional mobility/transfers, endurance, and safety awareness. At baseline, pt requires assistance from family for all ADLs, IADLs, and functional mobility. Pt's cognition has worsened in the last few months, however, appears close to baseline. Pt currently functioning at supervision-Mod A for bed mobility, Min A for simulated toiler transfer, Min A for UB dressing, and Max A for LB dressing. Pt required verbal cues for safety awareness throughout mobility. Pt will benefit from acute OT to increase overall independence in the areas of ADLs and functional mobility in order to safely discharge home. Pt could benefit from [facility] following D/C to decrease falls risk, improve balance, and maximize independence in self-care within own home environment.     Recommendations for follow up therapy are one component of a multi-disciplinary discharge planning process, led by the attending physician.  Recommendations may be updated based on patient status, additional functional criteria and insurance authorization.   Follow Up Recommendations  Home health OT    Assistance Recommended at Discharge Frequent or constant Supervision/Assistance  Patient  can return home with the following A lot of help with walking and/or transfers;A lot of help with bathing/dressing/bathroom;Assist for transportation;Help with stairs or ramp for entrance;Direct supervision/assist for medications management;Direct supervision/assist for financial management;Assistance with cooking/housework    Functional Status Assessment  Patient has had a recent decline in their functional status and demonstrates the ability to make significant improvements in function in a reasonable and predictable amount of time.  Equipment Recommendations  None recommended by OT    Recommendations for Other Services       Precautions / Restrictions Precautions Precautions: Fall Restrictions Weight Bearing Restrictions: No      Mobility Bed Mobility Overal bed mobility: Needs Assistance Bed Mobility: Supine to Sit, Sit to Supine     Supine to sit: Mod assist, HOB elevated Sit to supine: Supervision, HOB elevated   General bed mobility comments: Pt requires max cuing/extra time to initiate bed mobility with hospital bed features, assistance to upright trunk. Pt required +2 to scoot up toward HOB 2/2 fatigue.    Transfers Overall transfer level: Needs assistance Equipment used: Rolling walker (2 wheels) Transfers: Sit to/from Stand Sit to Stand: Min assist           General transfer comment: STS from EOB, VC for hand placement      Balance Overall balance assessment: Needs assistance Sitting-balance support: Feet supported Sitting balance-Leahy Scale: Fair Sitting balance - Comments: supervision static sitting EOB   Standing balance support: During functional activity, Bilateral upper extremity supported Standing balance-Leahy Scale: Poor                             ADL either performed or assessed with  clinical judgement   ADL Overall ADL's : Needs assistance/impaired Eating/Feeding: Set up;Bed level               Upper Body Dressing :  Minimal assistance;Sitting Upper Body Dressing Details (indicate cue type and reason): to don/doff gown Lower Body Dressing: Maximal assistance;Sitting/lateral leans   Toilet Transfer: Minimal assistance;Rolling walker (2 wheels) Toilet Transfer Details (indicate cue type and reason): simulated with STS from EOB         Functional mobility during ADLs: Minimal assistance;Rolling walker (2 wheels) (to take lateral steps toward HOB, Min A for RW management) General ADL Comments: Pt reporting bed soiled from bladder incontinence. NT present to assist with linen change.     Vision Patient Visual Report: No change from baseline       Perception     Praxis      Pertinent Vitals/Pain Pain Assessment Pain Assessment: Faces Faces Pain Scale: Hurts little more Pain Location: skin folds on R side of abdomen Pain Descriptors / Indicators: Aching, Crying, Discomfort, Grimacing, Guarding Pain Intervention(s): Monitored during session, Limited activity within patient's tolerance (barrier cream applied)     Hand Dominance Right   Extremity/Trunk Assessment Upper Extremity Assessment Upper Extremity Assessment: Generalized weakness;Difficult to assess due to impaired cognition   Lower Extremity Assessment Lower Extremity Assessment: Difficult to assess due to impaired cognition;Generalized weakness       Communication Communication Communication: HOH   Cognition Arousal/Alertness: Awake/alert Behavior During Therapy: Flat affect, Anxious, Restless Overall Cognitive Status: History of cognitive impairments - at baseline                                 General Comments: Daughter reports pt orientation has worsened in the last few months. Pt is able to recall her name & age but not her birthday & daughter reports this is close to baseline. Pt tearful throughout re: current situation and wanting to go home.     General Comments       Exercises Other Exercises Other  Exercises: OT provided education re: role of OT, OT POC, post acute recs, sitting up for all meals, EOB/OOB mobility with assistance, home/fall safety.    Shoulder Instructions      Home Living Family/patient expects to be discharged to:: Private residence Living Arrangements: Children Available Help at Discharge: Family;Available 24 hours/day Type of Home: House Home Access: Ramped entrance     Home Layout: Two level;Able to live on main level with bedroom/bathroom     Bathroom Shower/Tub: Other (comment) (walk in tub)   Bathroom Toilet: Handicapped height     Home Equipment: Conservation officer, nature (2 wheels);Wheelchair - manual;BSC/3in1;Shower seat - built in;Other (comment)   Additional Comments: Hospital bed, trapeze bar,      Prior Functioning/Environment Prior Level of Function : Needs assist             Mobility Comments: daughter reports pt primarily uses w/c at home, has been working on walking with RW & HHPT once/week, requires assistance for stand pivot transfers, intermittent assistance for bed mobility even with hospital bed & trapeze bar ADLs Comments: wears depends, assistance for peri hygiene & bathing/dressing.        OT Problem List: Decreased strength;Decreased activity tolerance;Impaired balance (sitting and/or standing);Decreased safety awareness;Decreased cognition;Pain;Decreased knowledge of use of DME or AE      OT Treatment/Interventions: Self-care/ADL training;Therapeutic exercise;Patient/family education;Therapeutic activities;DME and/or AE instruction;Cognitive remediation/compensation;Balance training  OT Goals(Current goals can be found in the care plan section) Acute Rehab OT Goals Patient Stated Goal: none stated OT Goal Formulation: Patient unable to participate in goal setting Time For Goal Achievement: 07/31/22 Potential to Achieve Goals: Fair   OT Frequency: Min 2X/week    Co-evaluation              AM-PAC OT "6 Clicks" Daily  Activity     Outcome Measure Help from another person eating meals?: A Little (set up) Help from another person taking care of personal grooming?: A Little Help from another person toileting, which includes using toliet, bedpan, or urinal?: A Lot Help from another person bathing (including washing, rinsing, drying)?: A Lot Help from another person to put on and taking off regular upper body clothing?: A Little Help from another person to put on and taking off regular lower body clothing?: A Lot 6 Click Score: 15   End of Session Equipment Utilized During Treatment: Gait belt;Rolling walker (2 wheels) Nurse Communication: Mobility status  Activity Tolerance: Patient tolerated treatment well;Patient limited by fatigue Patient left: in bed;with call bell/phone within reach;with bed alarm set  OT Visit Diagnosis: Unsteadiness on feet (R26.81);Muscle weakness (generalized) (M62.81);Other symptoms and signs involving cognitive function;Pain                Time: 7116-5790 OT Time Calculation (min): 19 min Charges:  OT General Charges $OT Visit: 1 Visit OT Evaluation $OT Eval Moderate Complexity: 1 Mod  [facility] MS, OTR/L ascom [phone]  07/17/22, 6:29 PM

## 2022-07-17 NOTE — ED Provider Notes (Signed)
[facility] Provider Note    Event Date/Time   First MD Initiated Contact with Patient 07/17/22 [phone]     (approximate)   History   Altered Mental Status   HPI  Cheyenne Frank is a 81 y.o. female with history of previous stroke, obesity, diabetes, hypertension, CHF, dementia who presents to the emergency department with family for concerns for altered mental status.  Became confused with left-sided facial droop and left sided weakness around 1 PM today per her daughters.  Her weakness has resolved but she continues to be confused.  History of frequent UTIs.  No known fevers, head injury, vomiting, diarrhea.  Patient denies any pain at this time.   History provided by patient's daughter by phone, patient but level 5 caveat secondary to confusion.    Past Medical History:  Diagnosis Date   AKI (acute kidney injury) ([place])    Anemia of chronic disease    Anxiety    Arthritis    Asthma    no problems since 1999 (after moving into a new home)   Chronic airway obstruction ([place])    Complication of anesthesia    " feel like I'm dying after I wake up"   Diabetes mellitus without complication ([place])    Diverticulosis    Dysrhythmia    unknown type    Fibromyalgia    Flat back syndrome    GERD (gastroesophageal reflux disease)    Hx of transfusion 2011   Hypertension    Itching    Macular degeneration    Nocturia    Postoperative anemia due to acute blood loss 04/09/2013   Restless leg syndrome    Sleep apnea    Dx 1990's unable to wear c-pap -   Stroke ([place]) 2010   verbal aphasia x 30 min - resolved - no problem since then    Past Surgical History:  Procedure Laterality Date   ABDOMINAL HYSTERECTOMY     rt so   BACK SURGERY  2010 / 2011   BREAST [place]  2015   cataracts removed     [place] [facility]  [place]     with T10-S1 fusion   TONSILLECTOMY     TOTAL KNEE ARTHROPLASTY Left 04/06/2013   Procedure: LEFT TOTAL KNEE ARTHROPLASTY;  Surgeon: Gearlean Alf, MD;  Location: [facility];  Service: Orthopedics;  Laterality: Left;    MEDICATIONS:  Prior to Admission medications   Medication Sig Start Date End Date Taking? Authorizing Provider  acetaminophen (TYLENOL) 500 MG tablet Take 500-1,000 mg by mouth every 6 (six) hours as needed for mild pain or fever.     [provider]  albuterol (VENTOLIN HFA) 108 (90 Base) MCG/ACT inhaler Inhale 2 puffs into the lungs every 6 (six) hours as needed for wheezing or shortness of breath. 05/19/20   [provider]  apixaban (ELIQUIS) 5 MG TABS tablet Take 1 tablet (5 mg total) by mouth 2 (two) times daily. 05/22/21   Shelly Coss, MD  aspirin EC 81 MG tablet Take 1 tablet (81 mg total) by mouth daily. MAY RESUME ON 12/31/20 Patient taking differently: Take 81 mg by mouth daily. 12/31/20   Bonnielee Haff, MD  carvedilol (COREG) 25 MG tablet Take 25 mg by mouth 2 (two) times daily.  [provider]  gabapentin (NEURONTIN) 300 MG capsule Take 1 capsule (300 mg total) by mouth 2 (two) times daily. 02/07/22   Nita Sells, MD  hydrALAZINE (APRESOLINE) 25 MG tablet Take 1 tablet (25 mg total) by mouth 3 (three) times daily as needed (For systolic above 0000000). Patient not taking: Reported on 02/04/2022 10/30/21   Lorella Nimrod, MD  insulin glargine (LANTUS) 100 UNIT/ML injection Inject 0.35 mLs (35 Units total) into the skin at bedtime. Patient taking differently: Inject 72 Units into the skin at bedtime. 02/02/22   Sharen Hones, MD  insulin lispro (HUMALOG) 100 UNIT/ML injection Inject 0.08 mLs (8 Units total) into the skin 3 (three) times daily with meals. Patient taking differently: Inject 22 Units into the skin 3 (three) times daily with meals. 02/02/22   Sharen Hones, MD  levETIRAcetam (KEPPRA) 1000 MG tablet  Take 1 tablet (1,000 mg total) by mouth 2 (two) times daily. 02/07/22   Nita Sells, MD  losartan (COZAAR) 50 MG tablet Take 1 tablet (50 mg total) by mouth daily. 02/07/22   Nita Sells, MD  Multiple Vitamin (MULTIVITAMIN WITH MINERALS) TABS tablet Take 1 tablet by mouth daily.    [provider]  omeprazole (PRILOSEC) 40 MG capsule Take 40 mg by mouth daily.     [provider]  oxybutynin (DITROPAN-XL) 10 MG 24 hr tablet Take 10 mg by mouth daily. 10/09/21   [provider]  PARoxetine (PAXIL) 40 MG tablet Take 1 tablet (40 mg total) by mouth at bedtime. 02/07/22   Nita Sells, MD  rosuvastatin (CRESTOR) 5 MG tablet Take 2.5 mg by mouth at bedtime.    [provider]  sitaGLIPtin (JANUVIA) 50 MG tablet Take 50 mg by mouth daily.    [provider]  torsemide (DEMADEX) 20 MG tablet Take 1 tablet (20 mg total) by mouth daily. Please start from 11/01/2021 10/30/21   Lorella Nimrod, MD  traZODone (DESYREL) 50 MG tablet Take 1 tablet (50 mg total) by mouth at bedtime. 02/07/22   Nita Sells, MD    Physical Exam   Triage Vital Signs: ED Triage Vitals  Enc Vitals Group     BP 07/16/22 1612 (!) 105/52     Pulse Rate 07/16/22 1612 77     Resp 07/16/22 1612 17     Temp 07/16/22 1612 98.5 F (36.9 C)     Temp Source 07/16/22 1612 Oral     SpO2 07/16/22 1612 93 %     Weight 07/16/22 1608 249 lb (112.9 kg)     Height 07/16/22 1608 5\' 6"  (1.676 m)     Head Circumference --      Peak Flow --      Pain Score 07/16/22 1608 0     Pain Loc --      Pain Edu? --      Excl. in [place]? --     Most recent vital signs: Vitals:   07/16/22 1612 07/17/22 0017  BP: (!) 105/52 (!) 128/42  Pulse: 77 77  Resp: 17 18  Temp: 98.5 F (36.9 C) 97.8 F (36.6 C)  SpO2: 93% 96%    CONSTITUTIONAL: Alert and oriented to person only.  Obese.  In no distress. HEAD: Normocephalic, atraumatic EYES: Conjunctivae clear, pupils appear equal,  sclera nonicteric ENT: normal nose; moist mucous membranes NECK: Supple, normal ROM CARD: RRR; S1 and S2 appreciated; no murmurs, no clicks, no rubs, no gallops RESP: Normal chest excursion without splinting or tachypnea; breath sounds  clear and equal bilaterally; no wheezes, no rhonchi, no rales, no hypoxia or respiratory distress, speaking full sentences ABD/GI: Normal bowel sounds; non-distended; soft, non-tender, no rebound, no guarding, no peritoneal signs BACK: The back appears normal EXT: Normal ROM in all joints; no deformity noted, no edema; no cyanosis SKIN: Normal color for age and race; warm; no rash on exposed skin NEURO: Moves all extremities equally, normal speech, no facial asymmetry, seems to have a hard time following commands, unable to reliably test vision or sensation PSYCH: The patient's mood and manner are appropriate.   ED Results / Procedures / Treatments   LABS: (all labs ordered are listed, but only abnormal results are displayed) Labs Reviewed  COMPREHENSIVE METABOLIC PANEL - Abnormal; Notable for the following components:      Result Value   Glucose, Bld 162 (*)    BUN 52 (*)    Creatinine, Ser 2.00 (*)    Calcium 8.8 (*)    Albumin 3.2 (*)    GFR, Estimated 25 (*)    All other components within normal limits  CBC - Abnormal; Notable for the following components:   WBC 11.1 (*)    Hemoglobin 11.3 (*)    HCT 35.1 (*)    Platelets 143 (*)    All other components within normal limits  URINALYSIS, ROUTINE W REFLEX MICROSCOPIC - Abnormal; Notable for the following components:   Color, Urine YELLOW (*)    APPearance CLEAR (*)    Hgb urine dipstick SMALL (*)    Protein, ur 30 (*)    Bacteria, UA RARE (*)    All other components within normal limits  CBG MONITORING, ED - Abnormal; Notable for the following components:   Glucose-Capillary 152 (*)    All other components within normal limits     EKG:  EKG Interpretation  Date/Time:  Monday July 16 2022 16:23:31 EDT Ventricular Rate:  78 PR Interval:  214 QRS Duration: 80 QT Interval:  384 QTC Calculation: 437 R Axis:   -23 Text Interpretation: Sinus rhythm with 1st degree A-V block Low voltage QRS Cannot rule out Anterior infarct , age undetermined Abnormal ECG When compared with ECG of 24-Jan-2022 18:47, PREVIOUS ECG IS PRESENT Confirmed by Pryor Curia [phone]) on 07/17/2022 4:59:13 AM         RADIOLOGY: My personal review and interpretation of imaging: CT head shows no acute abnormality.  MRI brain shows no infarct.  I have personally reviewed all radiology reports.   MR BRAIN WO CONTRAST  Result Date: 07/17/2022 CLINICAL DATA:  Difficult to wake up EXAM: MRI HEAD WITHOUT CONTRAST TECHNIQUE: Multiplanar, multiecho pulse sequences of the brain and surrounding structures were obtained without intravenous contrast. COMPARISON:  01/30/2022 FINDINGS: Brain: No acute infarction, hemorrhage, hydrocephalus, extra-axial collection or mass lesion. Small to moderate size, chronic but interval, bilateral cerebellar and left occipitoparietal cortex infarcts. Cortical laminar necrosis is seen at the left parietal cortex infarct. Interval small right centrum semiovale infarct. Chronic lacunar infarct at the left thalamus. Mild cerebral volume loss and chronic small vessel ischemia for age. Vascular: Normal flow voids. Skull and upper cervical spine: Normal marrow signal. Prior burr hole along the left parietal bone. Sinuses/Orbits: Negative. Other: Motion degraded. IMPRESSION: 1. No acute finding. 2. 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 Electronically Signed   By: Jorje Guild M.D.   On: 07/17/2022 04:20   CT HEAD WO CONTRAST (5MM)  Result Date: 07/17/2022 CLINICAL DATA:  Mental status change, unknown cause  EXAM: CT HEAD WITHOUT CONTRAST TECHNIQUE: Contiguous axial images were obtained from the base of the skull through the vertex without intravenous contrast. RADIATION  DOSE REDUCTION: This exam was performed according to the departmental dose-optimization program which includes automated exposure control, adjustment of the mA and/or kV according to patient size and/or use of iterative reconstruction technique. COMPARISON:  02/02/2022 FINDINGS: Brain: Mild age related volume loss. Old left thalamic lacunar infarct and right periventricular lacunar infarct. Chronic appearing left occipital infarct which is new since prior study. No acute intracranial abnormality. Specifically, no hemorrhage, hydrocephalus, mass lesion, acute infarction, or significant intracranial injury. Vascular: No hyperdense vessel or unexpected calcification. Skull: No acute calvarial abnormality. Sinuses/Orbits: No acute findings Other: None IMPRESSION: Left occipital infarct which appears chronic but is new since prior study. No acute intracranial abnormality. Electronically Signed   By: Rolm Baptise M.D.   On: 07/17/2022 02:51     PROCEDURES:  Critical Care performed: No      .1-3 Lead EKG Interpretation  Performed by: Jamy Whyte, Delice Bison, DO Authorized by: Lourene Hoston, Delice Bison, DO     Interpretation: normal     ECG rate:  77   ECG rate assessment: normal     Rhythm: sinus rhythm     Ectopy: none     Conduction: normal       IMPRESSION / MDM / ASSESSMENT AND PLAN / ED COURSE  I reviewed the triage vital signs and the nursing notes.    Patient here with altered mental status with concerns for left-sided weakness that has resolved.  Symptoms started at 1 PM per family.  Family states that she sometimes is confused but this is worse than normal for her.  On review of her records it does appear she has a diagnosis of dementia.  The patient is on the cardiac monitor to evaluate for evidence of arrhythmia and/or significant heart rate changes.   DIFFERENTIAL DIAGNOSIS (includes but not limited to):   TIA, CVA, intracranial hemorrhage, UTI, electrolyte derangement, symptomatic anemia,  dementia   Patient's presentation is most consistent with acute presentation with potential threat to life or bodily function.   PLAN: Work-up initiated from triage.  Labs show leukocytosis of 11.  Stable hemoglobin.  Normal electrolytes but creatinine is elevated to 2.  It appears on September 20 it was 1.5.  We will give IV fluid bolus.  Urine does not appear grossly infected today.  Will obtain CT of the head.   MEDICATIONS GIVEN IN ED: Medications  sodium chloride 0.9 % bolus 1,000 mL (has no administration in time range)     ED COURSE: CT head reviewed and interpreted by myself and the radiologist and shows a left occipital infarct which is likely chronic but new compared to previous imaging in May.  Will obtain MRI of the brain.   MRI brain reviewed and interpreted by myself and the radiologist and shows no acute infarct.  Will discuss with hospitalist for admission for continued altered mental status, AKI.  Suspect possible TIA given focal neurologic deficits with family.   CONSULTS:  Consulted and discussed patient's case with hospitalist, Dr. Damita Dunnings.  I have recommended admission and consulting physician agrees and will place admission orders.  Patient (and family if present) agree with this plan.   I reviewed all nursing notes, vitals, pertinent previous records.  All labs, EKGs, imaging ordered have been independently reviewed and interpreted by myself.    OUTSIDE RECORDS REVIEWED: Reviewed patient's last admission in May 2023.  FINAL CLINICAL IMPRESSION(S) / ED DIAGNOSES   Final diagnoses:  Altered mental status, unspecified altered mental status type  AKI (acute kidney injury) ([place])     Rx / DC Orders   ED Discharge Orders     None        Note:  This document was prepared using Dragon voice recognition software and may include unintentional dictation errors.   Lorey Pallett, Delice Bison, DO 07/17/22 [phone]

## 2022-07-17 NOTE — Assessment & Plan Note (Signed)
Patient has a history of a prior CVA and at baseline has mild cognitive impairment and is wheelchair-bound but able to ambulate short distances per daughter. Continue aspirin and statin

## 2022-07-18 DIAGNOSIS — N179 Acute kidney failure, unspecified: Secondary | ICD-10-CM | POA: Diagnosis not present

## 2022-07-18 DIAGNOSIS — G459 Transient cerebral ischemic attack, unspecified: Secondary | ICD-10-CM | POA: Diagnosis not present

## 2022-07-18 LAB — BASIC METABOLIC PANEL
Anion gap: 5 (ref 5–15)
BUN: 40 mg/dL — ABNORMAL HIGH (ref 8–23)
CO2: 29 mmol/L (ref 22–32)
Calcium: 8.7 mg/dL — ABNORMAL LOW (ref 8.9–10.3)
Chloride: 108 mmol/L (ref 98–111)
Creatinine, Ser: 1.41 mg/dL — ABNORMAL HIGH (ref 0.44–1.00)
GFR, Estimated: 38 mL/min — ABNORMAL LOW (ref >60)
Glucose, Bld: 196 mg/dL — ABNORMAL HIGH (ref 70–99)
Potassium: 4.3 mmol/L (ref 3.5–5.1)
Sodium: 142 mmol/L (ref 135–145)

## 2022-07-18 LAB — CBC
HCT: 33.1 % — ABNORMAL LOW (ref 36.0–46.0)
Hemoglobin: 10.5 g/dL — ABNORMAL LOW (ref 12.0–15.0)
MCH: 28.7 pg (ref 26.0–34.0)
MCHC: 31.7 g/dL (ref 30.0–36.0)
MCV: 90.4 fL (ref 80.0–100.0)
Platelets: 119 10*3/uL — ABNORMAL LOW (ref 150–400)
RBC: 3.66 MIL/uL — ABNORMAL LOW (ref 3.87–5.11)
RDW: 13.3 % (ref 11.5–15.5)
WBC: 4.7 10*3/uL (ref 4.0–10.5)
nRBC: 0 % (ref 0.0–0.2)

## 2022-07-18 LAB — CBG MONITORING, ED
Glucose-Capillary: 163 mg/dL — ABNORMAL HIGH (ref 70–99)
Glucose-Capillary: 179 mg/dL — ABNORMAL HIGH (ref 70–99)

## 2022-07-18 NOTE — Discharge Summary (Signed)
Physician Discharge Summary  Cheyenne Frank BMW:[id number] DOB: Jan 27, 1941 DOA: 07/17/2022  PCP: [facility]  Admit date: 07/17/2022 Discharge date: 07/18/2022  Admitted From: home  Disposition:  home w/ home health   Recommendations for Outpatient Follow-up:  Follow up with PCP in 1-2 weeks  Home Health: yes Equipment/Devices:  Discharge Condition: stable  CODE STATUS: DNR  Diet recommendation: Heart Healthy / Carb Modified  Brief/Interim Summary: HPI was taken from Dr. Joylene Igo: Cheyenne Frank is a 81 y.o. female with medical history significant for diabetes mellitus with complications of stage III chronic kidney disease, morbid obesity, history of CVA, hypertension, diverticulosis, chronic diastolic dysfunction CHF who was brought into the ER by EMS for evaluation of concerns of a possible stroke. Per family they were unable to arouse patient at about 1:30 PM and then noted some left-sided weakness and a left facial droop so EMS was called. Patient's symptoms resolved in the ER but family notes that she appeared more confused compared to her baseline. During my evaluation patient was oriented to person and place. She denies having any headache, no dizziness, no lightheadedness, no nausea, no vomiting, no abdominal pain, no chest pain, no cough, no fever, no chills, no blurred vision no focal deficit. She had a CT scan of the head done without contrast which showed Left occipital infarct which appears chronic but is new since prior study. No acute intracranial abnormality. MRI of the brain showed no acute finding. 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 She will be admitted to the hospital for further evaluation   As per Dr. Mayford Knife 07/18/22: MRI brain and CT head showed no acute intracranial findings. Of note, pt's Cr was trending down on day of d/c at 1.41 which w/in stage IIIb of CKD. PT/OT evaluated pt and recommended home health. Pt was already  receiving home health and this will be continued at d/c as per CM.   Discharge Diagnoses:  Principal Problem:   TIA (transient ischemic attack) Active Problems:   AKI (acute kidney injury) (HCC)   Paroxysmal atrial fibrillation (HCC)   Chronic diastolic CHF (congestive heart failure) (HCC)   Obesity, Class III, BMI 40-49.9 (morbid obesity) (HCC)   History of CVA (cerebrovascular accident)   Seizure disorder (HCC)   Dementia without behavioral disturbance (HCC)   CKD stage 3 due to type 2 diabetes mellitus (HCC)  TIA: MRI brain & CT head show no acute intracranial abnormalities. Continue on aspirin, statin & eliquis   PAF: continue on coreg, eliquis    Chronic diastolic CHF:  last known LVEF of 60 to 65% from echo in 02/23. Appears compensated. Continue on coreg and can restart losartan, torsemide    AKI on CKDIIIb: Cr is trending down from day prior. Avoid nephrotoxic meds  DM2: poorly controlled, HbA1c 9.1. Continue on glargine, SSI w/ accuchecks  Dementia: stable. Continue on home dose of paxil    Seizure disorder: hospitalized in 05/23 and was transferred to [facility] for continuous EEG which did not show any evidence of seizures and was within normal limits  & was discharged on Keppra and advised to follow-up with neurology to discuss  duration of therapy. Was unable to tolerate Keppra and they have self discontinued it.   Hx of CVA: continue on aspirin, statin. Wheelchair bound but able to ambulate short distances as per pt's daughter   Morbid obesity: BMI 40.1. Complicates overall care & prognosis   Discharge Instructions  Discharge Instructions  Diet - low sodium heart healthy   Complete by: As directed    Diet Carb Modified   Complete by: As directed    Discharge instructions   Complete by: As directed    F/u w/ PCP in 1-2 weeks   Increase activity slowly   Complete by: As directed       Allergies as of 07/18/2022       Reactions   Codeine  Palpitations   Other Reaction: RACES HEART   Dilaudid [hydromorphone] Other (See Comments), Rash   Other Reaction: lightheaded, altered ms, nervous, Paranoia paranoid Other Reaction: lightheaded, altered ms, nervous, Paranoia   Demerol [meperidine] Other (See Comments), Rash   agitated Agitation   Pravastatin Other (See Comments)   Muscle aches   Amiodarone Other (See Comments)   Per family pt had twitching while taking and cardiologist took pt off    Keppra [levetiracetam]    Lipitor [atorvastatin] Other (See Comments), Itching   unknown   Nifedipine Other (See Comments)   unknown   Statins Rash   Makes her crazy   Sulfa Antibiotics Rash   Vancomycin Rash   deafness        Medication List     TAKE these medications    acetaminophen 500 MG tablet Commonly known as: TYLENOL Take 500-1,000 mg by mouth every 6 (six) hours as needed for mild pain or fever.   albuterol 108 (90 Base) MCG/ACT inhaler Commonly known as: VENTOLIN HFA Inhale 2 puffs into the lungs every 6 (six) hours as needed for wheezing or shortness of breath.   apixaban 5 MG Tabs tablet Commonly known as: Eliquis Take 1 tablet (5 mg total) by mouth 2 (two) times daily.   aspirin EC 81 MG tablet Take 1 tablet (81 mg total) by mouth daily. MAY RESUME ON 12/31/20 What changed: additional instructions   carvedilol 25 MG tablet Commonly known as: COREG Take 25 mg by mouth 2 (two) times daily.   gabapentin 300 MG capsule Commonly known as: NEURONTIN Take 1 capsule (300 mg total) by mouth 2 (two) times daily.   hydrALAZINE 25 MG tablet Commonly known as: APRESOLINE Take 1 tablet (25 mg total) by mouth 3 (three) times daily as needed (For systolic above 160).   insulin glargine 100 UNIT/ML injection Commonly known as: LANTUS Inject 0.35 mLs (35 Units total) into the skin at bedtime. What changed: how much to take   insulin lispro 100 UNIT/ML injection Commonly known as: HumaLOG Inject 0.08 mLs (8  Units total) into the skin 3 (three) times daily with meals. What changed: how much to take   losartan 25 MG tablet Commonly known as: COZAAR Take 25 mg by mouth daily. What changed: Another medication with the same name was removed. Continue taking this medication, and follow the directions you see here.   multivitamin with minerals Tabs tablet Take 1 tablet by mouth daily.   omeprazole 40 MG capsule Commonly known as: PRILOSEC Take 40 mg by mouth daily.   oxybutynin 10 MG 24 hr tablet Commonly known as: DITROPAN-XL Take 10 mg by mouth daily.   PARoxetine 40 MG tablet Commonly known as: PAXIL Take 1 tablet (40 mg total) by mouth at bedtime.   rosuvastatin 5 MG tablet Commonly known as: CRESTOR Take 2.5 mg by mouth at bedtime.   sitaGLIPtin 50 MG tablet Commonly known as: JANUVIA Take 50 mg by mouth daily.   torsemide 20 MG tablet Commonly known as: DEMADEX Take 1 tablet (20 mg  total) by mouth daily. Please start from 11/01/2021   traZODone 50 MG tablet Commonly known as: DESYREL Take 1 tablet (50 mg total) by mouth at bedtime. What changed: how much to take        Allergies  Allergen Reactions   Codeine Palpitations    Other Reaction: RACES HEART   Dilaudid [Hydromorphone] Other (See Comments) and Rash    Other Reaction: lightheaded, altered ms, nervous, Paranoia paranoid Other Reaction: lightheaded, altered ms, nervous, Paranoia   Demerol [Meperidine] Other (See Comments) and Rash    agitated Agitation   Pravastatin Other (See Comments)    Muscle aches   Amiodarone Other (See Comments)    Per family pt had twitching while taking and cardiologist took pt off    Keppra [Levetiracetam]    Lipitor [Atorvastatin] Other (See Comments) and Itching    unknown   Nifedipine Other (See Comments)    unknown   Statins Rash    Makes her crazy   Sulfa Antibiotics Rash   Vancomycin Rash    deafness    Consultations:    Procedures/Studies: MR BRAIN WO  CONTRAST  Result Date: 07/17/2022 CLINICAL DATA:  Difficult to wake up EXAM: MRI HEAD WITHOUT CONTRAST TECHNIQUE: Multiplanar, multiecho pulse sequences of the brain and surrounding structures were obtained without intravenous contrast. COMPARISON:  01/30/2022 FINDINGS: Brain: No acute infarction, hemorrhage, hydrocephalus, extra-axial collection or mass lesion. Small to moderate size, chronic but interval, bilateral cerebellar and left occipitoparietal cortex infarcts. Cortical laminar necrosis is seen at the left parietal cortex infarct. Interval small right centrum semiovale infarct. Chronic lacunar infarct at the left thalamus. Mild cerebral volume loss and chronic small vessel ischemia for age. Vascular: Normal flow voids. Skull and upper cervical spine: Normal marrow signal. Prior burr hole along the left parietal bone. Sinuses/Orbits: Negative. Other: Motion degraded. IMPRESSION: 1. No acute finding. 2. 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 Electronically Signed   By: Tiburcio Pea M.D.   On: 07/17/2022 04:20   CT HEAD WO CONTRAST ( )  Result Date: 07/17/2022 CLINICAL DATA:  Mental status change, unknown cause EXAM: CT HEAD WITHOUT CONTRAST TECHNIQUE: Contiguous axial images were obtained from the base of the skull through the vertex without intravenous contrast. RADIATION DOSE REDUCTION: This exam was performed according to the departmental dose-optimization program which includes automated exposure control, adjustment of the mA and/or kV according to patient size and/or use of iterative reconstruction technique. COMPARISON:  02/02/2022 FINDINGS: Brain: Mild age related volume loss. Old left thalamic lacunar infarct and right periventricular lacunar infarct. Chronic appearing left occipital infarct which is new since prior study. No acute intracranial abnormality. Specifically, no hemorrhage, hydrocephalus, mass lesion, acute infarction, or significant intracranial injury.  Vascular: No hyperdense vessel or unexpected calcification. Skull: No acute calvarial abnormality. Sinuses/Orbits: No acute findings Other: None IMPRESSION: Left occipital infarct which appears chronic but is new since prior study. No acute intracranial abnormality. Electronically Signed   By: Charlett Nose M.D.   On: 07/17/2022 02:51   (Echo, Carotid, EGD, Colonoscopy, ERCP)    Subjective: Pt denies any complaints    Discharge Exam: Vitals:   07/18/22 1246 07/18/22 1249  BP:  (!) 149/71  Pulse: 71 73  Resp: 16 15  Temp:    SpO2:  100%   Vitals:   07/18/22 1050 07/18/22 1200 07/18/22 1246 07/18/22 1249  BP:  (!) 173/49  (!) 149/71  Pulse:  71 71 73  Resp:  14 16 15   Temp: 98.3  F (36.8 C)     TempSrc: Oral     SpO2:    100%  Weight:      Height:        General: Pt is alert, awake, not in acute distress Cardiovascular:S1/S2 +, no rubs, no gallops Respiratory: CTA bilaterally, no wheezing, no rhonchi Abdominal: Soft, NT, obese, bowel sounds + Extremities: no cyanosis    The results of significant diagnostics from this hospitalization (including imaging, microbiology, ancillary and laboratory) are listed below for reference.     Microbiology: No results found for this or any previous visit (from the past 240 hour(s)).   Labs: BNP (last 3 results) Recent Labs    09/16/21 1339  BNP 69.6   Basic Metabolic Panel: Recent Labs  Lab 07/16/22 1617 07/18/22 0629  NA 140 142  K 4.4 4.3  CL 101 108  CO2 29 29  GLUCOSE 162* 196*  BUN 52* 40*  CREATININE 2.00* 1.41*  CALCIUM 8.8* 8.7*   Liver Function Tests: Recent Labs  Lab 07/16/22 1617  AST 23  ALT 16  ALKPHOS 77  BILITOT 0.6  PROT 6.5  ALBUMIN 3.2*   No results for input(s): "LIPASE", "AMYLASE" in the last 168 hours. No results for input(s): "AMMONIA" in the last 168 hours. CBC: Recent Labs  Lab 07/16/22 1617 07/18/22 0629  WBC 11.1* 4.7  HGB 11.3* 10.5*  HCT 35.1* 33.1*  MCV 90.7 90.4  PLT  143* 119*   Cardiac Enzymes: No results for input(s): "CKTOTAL", "CKMB", "CKMBINDEX", "TROPONINI" in the last 168 hours. BNP: Invalid input(s): "POCBNP" CBG: Recent Labs  Lab 07/17/22 1211 07/17/22 1711 07/17/22 2140 07/18/22 0831 07/18/22 1202  GLUCAP 104* 191* 176* 163* 179*   D-Dimer No results for input(s): "DDIMER" in the last 72 hours. Hgb A1c Recent Labs    07/16/22 1617  HGBA1C 9.1*   Lipid Profile No results for input(s): "CHOL", "HDL", "LDLCALC", "TRIG", "CHOLHDL", "LDLDIRECT" in the last 72 hours. Thyroid function studies No results for input(s): "TSH", "T4TOTAL", "T3FREE", "THYROIDAB" in the last 72 hours.  Invalid input(s): "FREET3" Anemia work up No results for input(s): "VITAMINB12", "FOLATE", "FERRITIN", "TIBC", "IRON", "RETICCTPCT" in the last 72 hours. Urinalysis    Component Value Date/Time   COLORURINE YELLOW (A) 07/16/2022 1725   APPEARANCEUR CLEAR (A) 07/16/2022 1725   APPEARANCEUR Clear 06/29/2014 1138   LABSPEC 1.009 07/16/2022 1725   LABSPEC 1.019 06/29/2014 1138   PHURINE 5.0 07/16/2022 1725   GLUCOSEU NEGATIVE 07/16/2022 1725   GLUCOSEU Negative 06/29/2014 1138   HGBUR SMALL (A) 07/16/2022 1725   BILIRUBINUR NEGATIVE 07/16/2022 1725   BILIRUBINUR Negative 06/29/2014 1138   KETONESUR NEGATIVE 07/16/2022 1725   PROTEINUR 30 (A) 07/16/2022 1725   UROBILINOGEN 0.2 12/14/2013 1737   NITRITE NEGATIVE 07/16/2022 1725   LEUKOCYTESUR NEGATIVE 07/16/2022 1725   LEUKOCYTESUR Negative 06/29/2014 1138   Sepsis Labs Recent Labs  Lab 07/16/22 1617 07/18/22 0629  WBC 11.1* 4.7   Microbiology No results found for this or any previous visit (from the past 240 hour(s)).   Time coordinating discharge: Over 30 minutes  SIGNED:   Wyvonnia Dusky, MD  [facility] 07/18/2022, 2:27 PM Pager   If 7PM-7AM, please contact night-coverage [email or web address]

## 2022-07-18 NOTE — Evaluation (Signed)
Clinical/Bedside Swallow Evaluation Patient Details  Name: Cheyenne Frank MRN: [id number] Date of Birth: 1941-02-06  Today's Date: 07/18/2022 Time: SLP Start Time (ACUTE ONLY): 0822 SLP Stop Time (ACUTE ONLY): 0915 SLP Time Calculation (min) (ACUTE ONLY): 53 min  Past Medical History:  Past Medical History:  Diagnosis Date   AKI (acute kidney injury) (HCC)    Anemia of chronic disease    Anxiety    Arthritis    Asthma    no problems since 1999 (after moving into a new home)   Chronic airway obstruction (HCC)    Complication of anesthesia    " feel like I'm dying after I wake up"   Diabetes mellitus without complication (HCC)    Diverticulosis    Dysrhythmia    unknown type    Fibromyalgia    Flat back syndrome    GERD (gastroesophageal reflux disease)    Hx of transfusion 2011   Hypertension    Itching    Macular degeneration    Nocturia    Postoperative anemia due to acute blood loss 04/09/2013   Restless leg syndrome    Sleep apnea    Dx 1990's unable to wear c-pap -   Stroke (HCC) 2010   verbal aphasia x 30 min - resolved - no problem since then   Past Surgical History:  Past Surgical History:  Procedure Laterality Date   ABDOMINAL HYSTERECTOMY     rt so   BACK SURGERY  2010 / 2011   BREAST REDUCTION SURGERY     BURR HOLE FOR SUBDURAL HEMATOMA  2015   cataracts removed     CHOLECYSTECTOMY  1990   DILATION AND CURETTAGE OF UTERUS     HERNIA REPAIR     POSTERIOR LAMINECTOMY / DECOMPRESSION LUMBAR SPINE     with T10-S1 fusion   TONSILLECTOMY     TOTAL KNEE ARTHROPLASTY Left 04/06/2013   Procedure: LEFT TOTAL KNEE ARTHROPLASTY;  Surgeon: Loanne Drilling, MD;  Location: [facility];  Service: Orthopedics;  Laterality: Left;   HPI:  Pt is a 81 y.o. female with medical history significant for diabetes mellitus with complications of stage III chronic kidney disease, morbid obesity, history of CVA, hypertension, diverticulosis, chronic diastolic dysfunction CHF, GERD who  was brought into the ER by EMS for evaluation of concerns of a possible stroke; determined AKI.  Per family they were unable to arouse patient at about 1:30 PM and then noted some left-sided weakness and a left facial droop so EMS was called.  Patient's symptoms resolved in the ER but family notes that she appeared more confused compared to her baseline.  CXR negative.  MRI of the brain showed no acute finding. 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    Assessment / Plan / Recommendation  Clinical Impression   Pt seen this AM. Pt hard to awaken but once awake, she was verbal and engaged to follow basic commands w/ cues and answer basic questions re: self and foods she liked. NSG agreed.  On RA; afebrile, wbc wnl. Pt was alert to name and place(given choice of 2). Min slow motorically but helped to sit herself upright in bed w/ Mod cues.  Pt appears to present w/ grossly adequate oropharyngeal phase swallow w/ No gross oropharyngeal phase dysphagia noted, No neuromuscular deficits noted. Pt consumed po trials w/ No immediate, overt, clinical s/s of aspiration during po trials. Pt appears at reduced risk for aspiration following general aspiration precautions.  However, pt does  have challenging factors that could impact her oropharyngeal swallowing to include Baseline Dementia/Cognitive decline and fatigue/weakness. ANY Cognitive decline can impact oropharyngeal phase swallowing but this can be reduced following general aspiration precautions and when given support at meals.   Noted soft solid foods and min verbal/visual cues were helpful during the meal and for self-feeding.  During po trials, pt consumed all consistencies w/ no overt coughing, decline in vocal quality, or change in respiratory presentation during/post trials. O2 sats remained 98%. Oral phase appeared grossly [facility] w/ timely bolus management, mastication, and control of bolus propulsion for A-P transfer for swallowing. Oral  clearing achieved w/ all trial consistencies -- moistened, soft foods given. Noted min lengthy mastication of solids intermittently; deliberate oral phase attention/mastication, but complete and cleared orally b/t bites -- did not appear to overly slow down the meal. Min cues given intermittently d/t decreased oral prep awareness. OM Exam appeared [facility] w/ no unilateral weakness noted. Speech Clear. Pt fed self w/ setup support.   Recommend continue a fairly Regular consistency diet w/ well-Cut meats, moistened foods(ease for self-feeding and eating); Thin liquids. Recommend general aspiration and GERD/REFLUX precautions, tray setup at meals. Recommend reducing DISTRACTIONS at meals to ensure full focus on meal. Pills WHOLE vs Crushed in Puree for safer, easier swallowing IF needed d/t baseline Cognitive decline.  Education given on Pills in Puree as needed to NSG; food consistencies and easy to eat options at meals; general aspiration and REFLUX precautions to pt and NSG. NSG to reconsult if any new needs arise. NSG updated, agreed. MD updated. Recommend Dietician f/u for support. ST services recommends follow w/ Palliative Care for education re: impact of Cognitive decline/Dementia. Suspect pt is close to/at her baseline per previous evaluations/chart notes. Precautions posted in room. SLP Visit Diagnosis: Dysphagia, unspecified (R13.10) (Baseline Dementia; cues to support)    Aspiration Risk   (reduced following general aspiration precautions)    Diet Recommendation   a fairly Regular consistency diet w/ well-Cut meats, moistened foods(ease for self-feeding and eating); Thin liquids. Recommend general aspiration and GERD/REFLUX precautions, tray setup at meals. Recommend reducing DISTRACTIONS at meals to ensure full focus on meal.   Medication Administration: Whole meds with puree (vs need to Crush in puree for ease/safety of swallowing if needed)    Other  Recommendations Recommended Consults:   (Dietician f/u: Palliative Care following at home per chart) Oral Care Recommendations: Oral care BID;Oral care before and after PO;Staff/trained caregiver to provide oral care (support pt) Other Recommendations:  (n/a)    Recommendations for follow up therapy are one component of a multi-disciplinary discharge planning process, led by the attending physician.  Recommendations may be updated based on patient status, additional functional criteria and insurance authorization.  Follow up Recommendations No SLP follow up      Assistance Recommended at Discharge Set up Supervision/Assistance (as needed d/t Dementia baseline)  Functional Status Assessment Patient has not had a recent decline in their functional status  Frequency and Duration  (n/a)   (n/a)       Prognosis Prognosis for Safe Diet Advancement: Good Barriers to Reach Goals: Cognitive deficits;Time post onset;Severity of deficits;Language deficits Barriers/Prognosis Comment: baseline Dementia; needs support at meals      Swallow Study   General Date of Onset: 07/16/22 HPI: Pt is a 81 y.o. female with medical history significant for diabetes mellitus with complications of stage III chronic kidney disease, morbid obesity, history of CVA, hypertension, diverticulosis, chronic diastolic dysfunction CHF who was brought  into the ER by EMS for evaluation of concerns of a possible stroke; determined AKI.  Per family they were unable to arouse patient at about 1:30 PM and then noted some left-sided weakness and a left facial droop so EMS was called.  Patient's symptoms resolved in the ER but family notes that she appeared more confused compared to her baseline.  CXR negative.  MRI of the brain showed no acute finding. 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 Type of Study: Bedside Swallow Evaluation Previous Swallow Assessment: 01/2022 - regular diet then rec'd Diet Prior to this Study: Regular;Thin liquids Temperature Spikes  Noted: No (wbc 4.7) Respiratory Status: Room air History of Recent Intubation: No Behavior/Cognition: Alert;Cooperative;Pleasant mood;Confused;Distractible;Requires cueing (Baseline Dementia) Oral Cavity Assessment: Within Functional Limits Oral Care Completed by SLP: Yes Oral Cavity - Dentition: Adequate natural dentition Vision: Functional for self-feeding Self-Feeding Abilities: Able to feed self;Needs assist;Needs set up (generalized weakness; inattention at times) Patient Positioning: Upright in bed (needed positioning support) Baseline Vocal Quality: Normal Volitional Cough: Strong Volitional Swallow: Able to elicit    Oral/Motor/Sensory Function Overall Oral Motor/Sensory Function: Within functional limits   Ice Chips Ice chips: Within functional limits Presentation: Spoon (2 trials)   Thin Liquid Thin Liquid: Within functional limits Presentation: Cup;Self Fed;Straw (~8 ozs total) Other Comments: water, juice; min slow oral prep/self-feeding intermittently    Nectar Thick Nectar Thick Liquid: Not tested   Honey Thick Honey Thick Liquid: Not tested   Puree Puree: Within functional limits Presentation: Self Fed;Spoon (supported; ~3 ozs)   Solid     Solid: Within functional limits (grossly - no cues required) Presentation: Self Fed;Spoon (supported; >50% of french toast and fruit) Other Comments: min lengthy mastication; deliberate but complete and cleared orally b/t bites -- did not appear to overly slow down the meal        Jerilynn Som, MS, CCC-SLP Speech Language Pathologist Rehab Services; [facility] [phone] (ascom) Jahlon Baines 07/18/2022,9:19 AM

## 2022-07-18 NOTE — Progress Notes (Signed)
Patient is not able to walk the distance required to go the bathroom, or he/she is unable to safely negotiate stairs required to access the bathroom.  A 3in1 BSC will alleviate this problem  

## 2022-07-18 NOTE — ED Notes (Signed)
Attempted to call Caren Griffins, daughter to inform her of discharge for pt. No answer at this time.

## 2022-07-18 NOTE — Care Management Obs Status (Signed)
[facility] NOTIFICATION   Patient Details  Name: Cheyenne Frank MRN: [id number] Date of Birth: [birth year]   Medicare Observation Status Notification Given:  Yes    Shelbie Hutching, RN 07/18/2022, 2:59 PM

## 2022-07-18 NOTE — TOC Initial Note (Signed)
Transition of Care [facility]) - Initial/Assessment Note    Patient Details  Name: Cheyenne Frank MRN: [id number] Date of Birth: 02/17/1941  Transition [facility] CM/SW Contact:    Shelbie Hutching, RN Phone Number: 07/18/2022, 3:29 PM  Clinical Narrative:                 Patient placed under observation for TIA, she is now medically cleared for discharge home with home health care.  Patient is already open with [facility], [place] notified of discharge and resumption orders for PT and OT ordered.  Patient reports that she has a walker but not a bedside commode and she would like a bedside commode.  3 in 1 ordered from [employer], patient said it could be shipped to the home.  Patient lives with her daughter, Pricilla Larsson provides transportation and will be picking her up today.    Expected Discharge Plan: [place] Barriers to Discharge: Barriers Resolved   Patient Goals and CMS Choice Patient states their goals for this hospitalization and ongoing recovery are:: to go home CMS Medicare.gov Compare Post Acute Care list provided to:: Patient Choice offered to / list presented to : Patient, Adult Children  Expected Discharge Plan and Services Expected Discharge Plan: [place]   Discharge Planning Services: CM Consult Post Acute Care Choice: [place], Resumption of Svcs/PTA Provider Living arrangements for the past 2 months: Single Family Home Expected Discharge Date: 07/18/22               DME Arranged: 3-N-1 DME Agency: [employer] Date DME Agency Contacted: 07/18/22 Time DME Agency ContactedED:[id number] Representative spoke with at DME Agency: Suanne Marker HH Arranged: PT, OT [place] Agency: [place] Date [place]: 07/18/22 Time [place]: 71 Representative spoke with at [place]: [place]  Prior Living Arrangements/Services Living arrangements for the past 2 months: [place] with:: Adult  Children Patient language and need for interpreter reviewed:: Yes Do you feel safe going back to the place where you live?: Yes      Need for Family Participation in Patient Care: Yes (Comment) Care giver support system in place?: Yes (comment) Current home services: DME (walker) Criminal Activity/Legal Involvement Pertinent to Current Situation/Hospitalization: No - Comment as needed  Activities of Daily Living Home Assistive Devices/Equipment: Wheelchair, Environmental consultant (specify type) ADL Screening (condition at time of admission) Patient's cognitive ability adequate to safely complete daily activities?: Yes Is the patient deaf or have difficulty hearing?: No Does the patient have difficulty seeing, even when wearing glasses/contacts?: No Does the patient have difficulty concentrating, remembering, or making decisions?: Yes Patient able to express need for assistance with ADLs?: Yes Does the patient have difficulty dressing or bathing?: Yes Independently performs ADLs?: No Communication: Independent Dressing (OT): Needs assistance Is this a change from baseline?: Pre-admission baseline Grooming: Independent Feeding: Independent Bathing: Needs assistance Is this a change from baseline?: Pre-admission baseline Toileting: Needs assistance Is this a change from baseline?: Pre-admission baseline In/Out Bed: Needs assistance Is this a change from baseline?: Pre-admission baseline Walks in Home: Independent with device (comment) Does the patient have difficulty walking or climbing stairs?: Yes Weakness of Legs: Both Weakness of Arms/Hands: None  Permission Sought/Granted Permission sought to share information with : Case Manager, Family Supports Permission granted to share information with : Yes, Verbal Permission Granted  Share Information with NAME: Atha Starks  Permission granted to share info w AGENCY: [facility]  Permission granted to share info w Relationship: daughter  Permission  granted to share info w Contact Information: [phone]  Emotional Assessment Appearance:: Appears stated age Attitude/Demeanor/Rapport: Lethargic Affect (typically observed): Accepting Orientation: : Oriented to Self, Oriented to Place Alcohol / Substance Use: Not Applicable Psych Involvement: No (comment)  Admission diagnosis:  Acute CVA (cerebrovascular accident) ([place]) [I63.9] TIA (transient ischemic attack) [G45.9] Patient Active Problem List   Diagnosis Date Noted   Acute CVA (cerebrovascular accident) ([place]) 07/17/2022   CKD stage 3 due to type 2 diabetes mellitus ([place]) 07/17/2022   Postlaminectomy syndrome 02/05/2022   Dementia without behavioral disturbance ([place]) 20/35/5974   Acute metabolic encephalopathy 16/38/4536   Seizure disorder ([place]) 01/30/2022   Aphasia 01/26/2022   Hypertensive urgency 01/24/2022   Paroxysmal atrial fibrillation ([place]) 01/24/2022   History of CVA (cerebrovascular accident) 01/24/2022   Hoarseness of voice 10/27/2021   Chronic diastolic CHF (congestive heart failure) ([place]) 10/26/2021   Acute hypoxemic respiratory failure ([place]) 10/26/2021   Atrial flutter with rapid ventricular response ([place]) 09/16/2021   Stage 3b chronic kidney disease (CKD) ([place]) 09/16/2021   TIA (transient ischemic attack) 09/16/2021   COPD (chronic obstructive pulmonary disease) ([place]) 09/16/2021   Depression 46/80/3212   Acute diastolic CHF (congestive heart failure) ([place])    AKI (acute kidney injury) ([place])    Obesity, Class III, BMI 40-49.9 (morbid obesity) ([place])    Atrial fibrillation with RVR ([place]) 05/16/2021   Head injury 12/27/2020   SDH (subdural hematoma) ([place]) 12/11/2013   CVA (cerebral infarction) 12/11/2013   Uncontrolled type 2 diabetes mellitus with hyperglycemia, with long-term current use of insulin ([place]) 04/07/2013   Essential hypertension 04/07/2013   Sleep apnea 04/07/2013   OA (osteoarthritis) of knee 04/06/2013   PCP:  Angelene Giovanni [facility] Pharmacy:   [facility] [address], [place] [address] [place] [address] Phone: [phone] Fax: [phone]     Social Determinants of Health (SDOH) Interventions    Readmission Risk Interventions    07/18/2022    3:27 PM 01/28/2022    2:28 PM  Readmission Risk Prevention Plan  Transportation Screening Complete Complete  PCP or Specialist Appt within 3-5 Days  Complete  HRI or Home Care Consult  Complete  Social Work Consult for [place] Planning/Counseling  Complete  Palliative Care Screening  Complete  Medication Review Press photographer) Complete Complete  PCP or Specialist appointment within 3-5 days of discharge Complete   HRI or [place] Complete   SW Recovery Care/Counseling Consult Not Complete   SW Consult Not Complete Comments NA   Palliative Care Screening Not [place] Not Applicable

## 2022-07-18 NOTE — Care Management CC44 (Signed)
Condition Code 44 Documentation Completed  Patient Details  Name: Cheyenne Frank MRN: [id number] Date of Birth: 07/24/1941   Condition Code 44 given:  Yes Patient signature on Condition Code 44 notice:  Yes Documentation of 2 MD's agreement:  Yes Code 44 added to claim:  Yes    Shelbie Hutching, RN 07/18/2022, 3:00 PM

## 2022-07-18 NOTE — ED Notes (Signed)
Pt. Had a medium bm.

## 2022-07-31 ENCOUNTER — Other Ambulatory Visit: Payer: Medicare Other | Admitting: Primary Care

## 2022-07-31 DIAGNOSIS — F0390 Unspecified dementia without behavioral disturbance: Secondary | ICD-10-CM

## 2022-07-31 DIAGNOSIS — Z515 Encounter for palliative care: Secondary | ICD-10-CM

## 2022-07-31 DIAGNOSIS — Z8673 Personal history of transient ischemic attack (TIA), and cerebral infarction without residual deficits: Secondary | ICD-10-CM

## 2022-07-31 NOTE — Progress Notes (Signed)
[facility] Palliative Care Consult Note Telephone: [phone]  Fax: [phone]    Date of encounter: 07/31/22 2:21 PM PATIENT NAME: Cheyenne Frank [place] [address]   [phone] (home)  DOB: [birth year] MRN: [id number] PRIMARY CARE PROVIDER:    Scot Frank [facility],  [address] [place] [address] [phone]  REFERRING PROVIDER:   [place], [place],  [place] [address] [phone]  RESPONSIBLE PARTY:    Contact Information     Name Relation Home Work [place] Daughter [phone]  [phone]   Nason,Cynthia Daughter   [phone]        I met face to face with patient and family in  home. Palliative Care was asked to follow this patient by consultation request of  [place], [facility]* to address advance care planning and complex medical decision making. This is a follow up visit.                                   ASSESSMENT AND PLAN / RECOMMENDATIONS:   Advance Care Planning/Goals of Care: Goals include to maximize quality of life and symptom management. Patient/health care surrogate gave his/her permission to discuss. Our advance care planning conversation included a discussion about:    The value and importance of advance care planning  Experiences with loved ones who have been seriously ill or have died  Exploration of personal, cultural or spiritual beliefs that might influence medical decisions  Identification of a healthcare agent - daughters Pt increasingly home bound. CODE STATUS: Dnr   Symptom Management/Plan:  I met with patient in her home with her family present. Daughter outlined her going to the hospital after some change in mentation. She was found to have had some old stroke activity, but nothing acute. She states seizures were ruled out back in March or May of this year. Patient states she has  chronic leg pain. She does endorse some insomnia with sleep initiation. I would recommend increasing her trazodone to 100 mg at HS.  Patient is Able to answer simple questions. She's eating well and able to ambulate with some assistance. She is having difficulty getting out to appointments and may benefit from a home-based service. The palliative care nurse practitioner home visiting program is ending it and I apprise the family of this.  She will discharge from her service at this time.    Follow up Palliative Care Visit: Palliative care  home NP visiting program ending, family apprised.  PPS: 40%  HOSPICE ELIGIBILITY/DIAGNOSIS: TBD  Chief Complaint: debility, dementia  HISTORY OF PRESENT ILLNESS:  Cheyenne Frank is a 81 y.o. year old female  with cva, debility, dementia .   History obtained from review of EMR, discussion with primary team, and interview with family, facility staff/caregiver and/or Cheyenne Frank.  I reviewed available labs, medications, imaging, studies and related documents from the EMR.  Records reviewed and summarized above.   ROS  General: NAD EYES: denies vision changes ENMT: denies dysphagia although coughing on eating. Cardiovascular: denies chest pain, denies DOE Pulmonary: endorses cough, denies increased SOB Abdomen: endorses good appetite, denies constipation, endorses incontinence of bowel GU: denies dysuria, endorses incontinence of urine MSK:  denies increased weakness,  no falls reported Skin: denies rashes or wounds Neurological:  endorses leg pain, endorses insomnia Psych: Endorses positive mood  Heme/lymph/immuno: denies bruises, abnormal bleeding  Physical Exam: Current and past weights: 230 lbs Constitutional: NAD General: frail appearing, obese  EYES: anicteric sclera, lids intact, no discharge  ENMT: intact hearing, oral mucous membranes moist, dentition intact CV: S1S2, RRR, no LE edema Pulmonary: LCTA, no increased work of breathing, no  cough, room air Abdomen: intake 80%, soft and non tender, no ascites MSK: + sarcopenia, moves all extremities, ambulatory with standby and walker  Skin: warm and dry, no rashes or wounds on visible skin Neuro:  + generalized weakness,  + cognitive impairment Psych: non-anxious affect, A and O x 2 Hem/lymph/immuno: no widespread bruising   Thank you for the opportunity to participate in the care of Cheyenne Frank. Please call our office at [phone] if we can be of additional assistance.   Jason Coop, NP   COVID-19 PATIENT SCREENING TOOL Asked and negative response unless otherwise noted:   Have you had symptoms of covid, tested positive or been in contact with someone with symptoms/positive test in the past 5-10 days?

## 2022-09-09 ENCOUNTER — Emergency Department: Payer: Medicare Other

## 2022-09-09 ENCOUNTER — Other Ambulatory Visit: Payer: Self-pay

## 2022-09-09 ENCOUNTER — Observation Stay
Admission: EM | Admit: 2022-09-09 | Discharge: 2022-09-10 | Disposition: A | Discharge status: Home / Self Care | Payer: Medicare Other | Attending: Family Medicine | Admitting: Family Medicine

## 2022-09-09 DIAGNOSIS — R079 Chest pain, unspecified: Secondary | ICD-10-CM | POA: Diagnosis present

## 2022-09-09 DIAGNOSIS — Z79899 Other long term (current) drug therapy: Secondary | ICD-10-CM | POA: Diagnosis not present

## 2022-09-09 DIAGNOSIS — N1832 Chronic kidney disease, stage 3b: Secondary | ICD-10-CM | POA: Diagnosis not present

## 2022-09-09 DIAGNOSIS — F0390 Unspecified dementia without behavioral disturbance: Secondary | ICD-10-CM | POA: Insufficient documentation

## 2022-09-09 DIAGNOSIS — E1165 Type 2 diabetes mellitus with hyperglycemia: Secondary | ICD-10-CM | POA: Diagnosis not present

## 2022-09-09 DIAGNOSIS — R3589 Other polyuria: Secondary | ICD-10-CM | POA: Insufficient documentation

## 2022-09-09 DIAGNOSIS — Z794 Long term (current) use of insulin: Secondary | ICD-10-CM | POA: Insufficient documentation

## 2022-09-09 DIAGNOSIS — Z8673 Personal history of transient ischemic attack (TIA), and cerebral infarction without residual deficits: Secondary | ICD-10-CM | POA: Diagnosis not present

## 2022-09-09 DIAGNOSIS — J45909 Unspecified asthma, uncomplicated: Secondary | ICD-10-CM | POA: Insufficient documentation

## 2022-09-09 DIAGNOSIS — G4730 Sleep apnea, unspecified: Secondary | ICD-10-CM | POA: Diagnosis present

## 2022-09-09 DIAGNOSIS — I129 Hypertensive chronic kidney disease with stage 1 through stage 4 chronic kidney disease, or unspecified chronic kidney disease: Secondary | ICD-10-CM | POA: Insufficient documentation

## 2022-09-09 DIAGNOSIS — R7989 Other specified abnormal findings of blood chemistry: Secondary | ICD-10-CM | POA: Diagnosis not present

## 2022-09-09 DIAGNOSIS — E1122 Type 2 diabetes mellitus with diabetic chronic kidney disease: Secondary | ICD-10-CM | POA: Insufficient documentation

## 2022-09-09 DIAGNOSIS — I4891 Unspecified atrial fibrillation: Secondary | ICD-10-CM | POA: Diagnosis not present

## 2022-09-09 DIAGNOSIS — F32A Depression, unspecified: Secondary | ICD-10-CM | POA: Diagnosis present

## 2022-09-09 DIAGNOSIS — I480 Paroxysmal atrial fibrillation: Principal | ICD-10-CM | POA: Insufficient documentation

## 2022-09-09 DIAGNOSIS — R0789 Other chest pain: Secondary | ICD-10-CM | POA: Diagnosis present

## 2022-09-09 DIAGNOSIS — Z96652 Presence of left artificial knee joint: Secondary | ICD-10-CM | POA: Diagnosis not present

## 2022-09-09 DIAGNOSIS — I10 Essential (primary) hypertension: Secondary | ICD-10-CM | POA: Diagnosis present

## 2022-09-09 DIAGNOSIS — Z7982 Long term (current) use of aspirin: Secondary | ICD-10-CM | POA: Diagnosis not present

## 2022-09-09 DIAGNOSIS — I2089 Other forms of angina pectoris: Secondary | ICD-10-CM | POA: Diagnosis not present

## 2022-09-09 LAB — PROTIME-INR
INR: 1.1 (ref 0.8–1.2)
Prothrombin Time: 14 seconds (ref 11.4–15.2)

## 2022-09-09 LAB — BASIC METABOLIC PANEL
Anion gap: 9 (ref 5–15)
BUN: 43 mg/dL — ABNORMAL HIGH (ref 8–23)
CO2: 31 mmol/L (ref 22–32)
Calcium: 9.3 mg/dL (ref 8.9–10.3)
Chloride: 102 mmol/L (ref 98–111)
Creatinine, Ser: 1.66 mg/dL — ABNORMAL HIGH (ref 0.44–1.00)
GFR, Estimated: 31 mL/min — ABNORMAL LOW (ref >60)
Glucose, Bld: 266 mg/dL — ABNORMAL HIGH (ref 70–99)
Potassium: 3.9 mmol/L (ref 3.5–5.1)
Sodium: 142 mmol/L (ref 135–145)

## 2022-09-09 LAB — URINALYSIS, COMPLETE (UACMP) WITH MICROSCOPIC
Bilirubin Urine: NEGATIVE
Glucose, UA: NEGATIVE mg/dL
Ketones, ur: NEGATIVE mg/dL
Leukocytes,Ua: NEGATIVE
Nitrite: NEGATIVE
Protein, ur: 30 mg/dL — AB
Specific Gravity, Urine: 1.008 (ref 1.005–1.030)
WBC, UA: NONE SEEN WBC/hpf (ref 0–5)
pH: 5 (ref 5.0–8.0)

## 2022-09-09 LAB — CBC
HCT: 38.9 % (ref 36.0–46.0)
Hemoglobin: 12.4 g/dL (ref 12.0–15.0)
MCH: 29.3 pg (ref 26.0–34.0)
MCHC: 31.9 g/dL (ref 30.0–36.0)
MCV: 92 fL (ref 80.0–100.0)
Platelets: 128 10*3/uL — ABNORMAL LOW (ref 150–400)
RBC: 4.23 MIL/uL (ref 3.87–5.11)
RDW: 13.8 % (ref 11.5–15.5)
WBC: 7.2 10*3/uL (ref 4.0–10.5)
nRBC: 0 % (ref 0.0–0.2)

## 2022-09-09 LAB — CBG MONITORING, ED
Glucose-Capillary: 135 mg/dL — ABNORMAL HIGH (ref 70–99)
Glucose-Capillary: 138 mg/dL — ABNORMAL HIGH (ref 70–99)

## 2022-09-09 LAB — PROCALCITONIN: Procalcitonin: <0.1 ng/mL

## 2022-09-09 LAB — TROPONIN I (HIGH SENSITIVITY)
Troponin I (High Sensitivity): 14 ng/L (ref <18)
Troponin I (High Sensitivity): 18 ng/L — ABNORMAL HIGH (ref <18)

## 2022-09-09 LAB — TSH: TSH: 3.309 u[IU]/mL (ref 0.350–4.500)

## 2022-09-09 MED ORDER — ISOSORBIDE MONONITRATE ER 30 MG PO TB24
15.0000 mg | ORAL_TABLET | Freq: Once | ORAL | Status: DC
  Filled 2022-09-09: qty 1

## 2022-09-09 MED ORDER — TRAZODONE HCL 50 MG PO TABS: 75.0000 mg | ORAL_TABLET | Freq: Every evening | ORAL | Status: DC | PRN

## 2022-09-09 MED ORDER — GABAPENTIN 300 MG PO CAPS
300.0000 mg | ORAL_CAPSULE | Freq: Two times a day (BID) | ORAL | Status: DC
  Administered 2022-09-10 (×2): 300 mg via ORAL
  Filled 2022-09-09 (×2): qty 1

## 2022-09-09 MED ORDER — PAROXETINE HCL 20 MG PO TABS
40.0000 mg | ORAL_TABLET | Freq: Every day | ORAL | Status: DC
  Administered 2022-09-10: 40 mg via ORAL
  Filled 2022-09-09: qty 2

## 2022-09-09 MED ORDER — DILTIAZEM HCL 25 MG/5ML IV SOLN
5.0000 mg | Freq: Once | INTRAVENOUS | Status: AC
  Administered 2022-09-09: 5 mg via INTRAVENOUS
  Filled 2022-09-09: qty 5

## 2022-09-09 MED ORDER — TORSEMIDE 20 MG PO TABS
20.0000 mg | ORAL_TABLET | Freq: Every day | ORAL | Status: DC
  Administered 2022-09-10: 20 mg via ORAL
  Filled 2022-09-09: qty 1

## 2022-09-09 MED ORDER — LOSARTAN POTASSIUM 50 MG PO TABS
25.0000 mg | ORAL_TABLET | Freq: Every day | ORAL | Status: DC
  Filled 2022-09-09: qty 1

## 2022-09-09 MED ORDER — ACETAMINOPHEN 325 MG PO TABS: 650.0000 mg | ORAL_TABLET | ORAL | Status: DC | PRN

## 2022-09-09 MED ORDER — ONDANSETRON HCL 4 MG/2ML IJ SOLN: 4.0000 mg | Freq: Four times a day (QID) | INTRAMUSCULAR | Status: DC | PRN

## 2022-09-09 MED ORDER — ALBUTEROL SULFATE HFA 108 (90 BASE) MCG/ACT IN AERS: 2.0000 | INHALATION_SPRAY | Freq: Four times a day (QID) | RESPIRATORY_TRACT | Status: DC | PRN

## 2022-09-09 MED ORDER — CARVEDILOL 6.25 MG PO TABS
25.0000 mg | ORAL_TABLET | Freq: Two times a day (BID) | ORAL | Status: DC
  Administered 2022-09-10: 25 mg via ORAL
  Filled 2022-09-09: qty 4

## 2022-09-09 MED ORDER — PANTOPRAZOLE SODIUM 40 MG PO TBEC
80.0000 mg | DELAYED_RELEASE_TABLET | Freq: Every day | ORAL | Status: DC
  Administered 2022-09-10: 80 mg via ORAL
  Filled 2022-09-09: qty 2

## 2022-09-09 MED ORDER — DILTIAZEM HCL-DEXTROSE 125-5 MG/125ML-% IV SOLN (PREMIX)
5.0000 mg/h | INTRAVENOUS | Status: DC
  Administered 2022-09-09: 5 mg/h via INTRAVENOUS
  Administered 2022-09-10: 12.5 mg/h via INTRAVENOUS
  Filled 2022-09-09 (×2): qty 125

## 2022-09-09 MED ORDER — INSULIN ASPART 100 UNIT/ML IJ SOLN
0.0000 [IU] | Freq: Three times a day (TID) | INTRAMUSCULAR | Status: DC
  Administered 2022-09-09: 3 [IU] via SUBCUTANEOUS
  Administered 2022-09-10: 11 [IU] via SUBCUTANEOUS
  Administered 2022-09-10: 3 [IU] via SUBCUTANEOUS
  Filled 2022-09-09 (×3): qty 1

## 2022-09-09 MED ORDER — ROSUVASTATIN CALCIUM 5 MG PO TABS
2.5000 mg | ORAL_TABLET | Freq: Every day | ORAL | Status: DC
  Administered 2022-09-10: 2.5 mg via ORAL
  Filled 2022-09-09: qty 0.5

## 2022-09-09 MED ORDER — DILTIAZEM HCL 25 MG/5ML IV SOLN
10.0000 mg | Freq: Once | INTRAVENOUS | Status: AC
  Administered 2022-09-09: 10 mg via INTRAVENOUS

## 2022-09-09 MED ORDER — OXYBUTYNIN CHLORIDE ER 10 MG PO TB24
10.0000 mg | ORAL_TABLET | Freq: Every day | ORAL | Status: DC
  Administered 2022-09-10: 10 mg via ORAL
  Filled 2022-09-09: qty 1

## 2022-09-09 MED ORDER — ASPIRIN 81 MG PO TBEC
81.0000 mg | DELAYED_RELEASE_TABLET | Freq: Every day | ORAL | Status: DC
  Administered 2022-09-10: 81 mg via ORAL
  Filled 2022-09-09: qty 1

## 2022-09-09 MED ORDER — ADULT MULTIVITAMIN W/MINERALS CH
1.0000 | ORAL_TABLET | Freq: Every day | ORAL | Status: DC
  Administered 2022-09-10: 1 via ORAL
  Filled 2022-09-09: qty 1

## 2022-09-09 MED ORDER — HEPARIN SODIUM (PORCINE) 5000 UNIT/ML IJ SOLN: 5000.0000 [IU] | Freq: Three times a day (TID) | INTRAMUSCULAR | Status: DC

## 2022-09-09 MED ORDER — ALBUTEROL SULFATE (2.5 MG/3ML) 0.083% IN NEBU: 2.5000 mg | INHALATION_SOLUTION | Freq: Four times a day (QID) | RESPIRATORY_TRACT | Status: DC | PRN

## 2022-09-09 MED ORDER — APIXABAN 5 MG PO TABS
5.0000 mg | ORAL_TABLET | Freq: Two times a day (BID) | ORAL | Status: DC
  Administered 2022-09-10 (×2): 5 mg via ORAL
  Filled 2022-09-09 (×2): qty 1

## 2022-09-09 MED ORDER — INSULIN ASPART 100 UNIT/ML IJ SOLN: 0.0000 [IU] | Freq: Every day | INTRAMUSCULAR | Status: DC

## 2022-09-09 MED ORDER — INSULIN GLARGINE-YFGN 100 UNIT/ML ~~LOC~~ SOLN
55.0000 [IU] | Freq: Every day | SUBCUTANEOUS | Status: DC
  Administered 2022-09-10: 55 [IU] via SUBCUTANEOUS
  Filled 2022-09-09 (×2): qty 0.55

## 2022-09-09 NOTE — Assessment & Plan Note (Addendum)
-  Does not meet criteria for acute kidney injury at this time - And eGFR has been stable at 31 which is CKD 3B - Strict I's and O's - Repeat BMP in the a.m.

## 2022-09-09 NOTE — ED Notes (Signed)
MD [facility] notified patient is back in A-fib with HR of 126. MD at bedside. Pt denies chest pain at this time.

## 2022-09-09 NOTE — ED Notes (Signed)
Assumed care of patient.  Monitors in place. Pt reports mild pain earlier, resolved now, new EKG obtained.   EKG shows ST, notified Dr. Sedalia Muta - per Dr Sedalia Muta continue diltiazem drip per ordered rate. Pt reports no pain at present. Respirations even and unlabored, bed changed.  Provided with juice

## 2022-09-09 NOTE — H&P (Addendum)
History and Physical   Cheyenne Frank ZOX:[id number] DOB: 10-Apr-1941 DOA: 09/09/2022  PCP: [facility]  Outpatient Specialists: Dr. Leanna Sato clinic cardiology Patient coming from: Home via EMS  I have personally briefly reviewed patient's old medical records in [place].  Chief Concern: Chest discomfort  HPI: Cheyenne Frank is a 81 year old female with history hypertension, atrial fibrillation, depression, anxiety, GERD, hyperlipidemia, who presents to emergency department for chief concerns of chest pain and shortness of breath.  Initial vitals in the emergency department showed temperature of 98.4, respiration rate of 18, heart rate of 86, blood pressure 126/92, SpO2 of 100% on room air.  Serum sodium is 142, potassium 2.9, chloride 102, bicarb 31, BUN of 43, serum creatinine of 1.66, EGFR of 31, WBC 7.2, hemoglobin 12.6, platelets of 126.  High sensitive troponin was 14 and on repeat was 18.  EDP discussed case with Dr. Humphrey Rolls, cardiology on-call who recommends starting patient on Imdur.  ED treatment: Diltiazem 10 mg IV one-time dose, 5 mg IV one-time dose, isosorbide mononitrate 15 mg p.o. one-time dose, and patient was placed on diltiazem drip. ----------------------------- At bedside patient is able to tell me her name, she states her age is 81, she knows she is in the hospital, and she knows it is Christmas time.  She reports that she started having chest discomfort on 09/08/2022.  Her daughter, [name], whom she lives with states that she was having symptoms on 09/07/2022.  She reports no changes to her physical activity at that time.  At bedside, patient denies current chest pain or shortness of breath.  Patient is not able to reliably tell me her symptoms including reviews of systems.  She endorses that she has diarrhea and then corrects herself and states that she has loose stools, twice per day.  Upon further clarification, she states  that these bowel movements are normal for her and denies diarrhea.  She denies blood in her stool, black/melena stool, dysuria, hematuria.  She denies nausea, vomiting, cough.  Her daughters, [name] and [name] are at bedside.  They take turns taking care of her and she lives with her daughter [name] full-time.  Of note, daughter endorses that patient is at her wheelchair in the middle of the night to eat sweets.  They do not stop this as this is her single form of pleasure.  Daughter further endorses that they do not check her blood sugar regularly instead just keeps the prescribed insulin of Humalog and scheduled Lantus at 55 units nightly.  Social history: She lives at home with her daughter [name].  She denies tobacco, EtOH, recreational drug use.  ROS: Constitutional: no weight change, no fever ENT/Mouth: no sore throat, no rhinorrhea Eyes: no eye pain, no vision changes Cardiovascular: + chest pain, + dyspnea,  no edema, no palpitations Respiratory: no cough, no sputum, no wheezing Gastrointestinal: no nausea, no vomiting, no diarrhea, no constipation Genitourinary: no urinary incontinence, no dysuria, no hematuria Musculoskeletal: no arthralgias, no myalgias Skin: no skin lesions, no pruritus, Neuro: + weakness, no loss of consciousness, no syncope Psych: no anxiety, no depression, + decrease appetite Heme/Lymph: no bruising, no bleeding  ED Course: Discussed with emergency medicine provider, patient requiring hospitalization for chief concerns of chest discomfort.  Assessment/Plan  Principal Problem:   Atrial fibrillation with RVR (HCC) Active Problems:   Paroxysmal atrial fibrillation ([place])   Uncontrolled type 2 diabetes mellitus with hyperglycemia, with long-term current use of insulin (HCC)   Stage 3b  chronic kidney disease (CKD) ([place])   Essential hypertension   Sleep apnea   Obesity, Class III, BMI 40-49.9 (morbid obesity) ([place])   Depression   History of CVA (cerebrovascular  accident)   Dementia without behavioral disturbance (HCC)   Chest pain   Elevated serum creatinine   Polyuria   Elevated troponin   Assessment and Plan:  * Atrial fibrillation with RVR ([place]) - Etiology workup in progress, check TSH, procalcitonin, UA with complete microscopy - Cardiology, Dr. Humphrey Rolls recommended initiating Imdur 15 mg p.o. one-time dose which was given in the ED - Complete echo ordered, pending echo results I would recommend a.m. team consult cardiology as needed - Admit to progressive cardiac, observation  Paroxysmal atrial fibrillation ([place]) - Resumed home Coreg 25 mg p.o. twice daily  Uncontrolled type 2 diabetes mellitus with hyperglycemia, with long-term current use of insulin ([place]) - A1c check on 07/16/2022 showed level of 9.1. She gets 55 lantus qhs - I did counsel that eating sweets and not following a diabetic diet does increase patient's sugar level and I recognize that she is 81 years old and acknowledged that her sugar intake may be her only form of pleasure.  Her daughters agree that this is her only form of pleasure and they did not want to hit her her consuming sugar/sweet products. - I did counsel that if we continue with not following a diabetic diet, I recommend that her sugar is checked 3 times daily AC given that she is on high-dose of insulin and sulfonylurea.  I stated that my concern is for uncontrolled hyperglycemia and especially for hypoglycemia that can lead to comatose and death.  Daughters acknowledge this information and endorses compliance. - Long-acting insulin, 55 units nightly resumed - Insulin SSI with at bedtime coverage ordered, obesity dosing - Goal inpatient blood glucose level is 140-180  Stage 3b chronic kidney disease (CKD) (HCC) - At baseline  Elevated troponin - Presumed secondary to A-fib, treat per A-fib with  Polyuria - Check a UA with complete microscopy - Differentials include urinary tract infection versus  hyperglycemia in setting of 81 year old female with dementia not being able to follow diabetic diet  Elevated serum creatinine - Does not meet criteria for acute kidney injury at this time - And eGFR has been stable at 31 which is CKD 3B - Strict I's and O's - Repeat BMP in the a.m.  Chest pain - Presumed secondary to atrial fibrillation with RVR, etiology workup in progress  Obesity, Class III, BMI 40-49.9 (morbid obesity) ([place]) - This complicates overall care and prognosis.   Chart reviewed.   DVT prophylaxis: Eliquis 5 mg p.o. twice daily Code Status: Full code; daughters at bedside states that patient has a DO NOT RESUSCITATE order however at bedside she is awake alert and oriented x 3 and has selected full code, I stated that we would respect her wishes at this time Diet: Heart healthy Family Communication: updated daughters, [name] instantly at bedside with patient's permission Disposition Plan: Pending clinical course Consults called: EDP consulted cardiology; I have not formally consulted cardiology as patient's symptoms controlled/resolved and we are awaiting complete echo Admission status: Observation, progressive cardiac  Past Medical History:  Diagnosis Date   AKI (acute kidney injury) ([place])    Anemia of chronic disease    Anxiety    Arthritis    Asthma    no problems since 1999 (after moving into a new home)   Chronic airway obstruction ([place])    Complication  of anesthesia    " feel like I'm dying after I wake up"   Diabetes mellitus without complication ([place])    Diverticulosis    Dysrhythmia    unknown type    Fibromyalgia    Flat back syndrome    GERD (gastroesophageal reflux disease)    Hx of transfusion 2011   Hypertension    Itching    Macular degeneration    Nocturia    Postoperative anemia due to acute blood loss 04/09/2013   Restless leg syndrome    Sleep apnea    Dx 1990's unable to wear c-pap -   Stroke ([place]) 2010   verbal aphasia x 30 min -  resolved - no problem since then   Past Surgical History:  Procedure Laterality Date   ABDOMINAL HYSTERECTOMY     rt so   BACK SURGERY  2010 / 2011   [place]  2015   cataracts removed     [place]     with T10-S1 fusion   TONSILLECTOMY     TOTAL KNEE ARTHROPLASTY Left 04/06/2013   Procedure: LEFT TOTAL KNEE ARTHROPLASTY;  Surgeon: Gearlean Alf, MD;  Location: [facility];  Service: Orthopedics;  Laterality: Left;   Social History:  reports that she has never smoked. She has never used smokeless tobacco. She reports that she does not drink alcohol and does not use drugs.  Allergies  Allergen Reactions   Codeine Palpitations    Other Reaction: RACES HEART   Dilaudid [Hydromorphone] Other (See Comments) and Rash    Other Reaction: lightheaded, altered ms, nervous, Paranoia paranoid Other Reaction: lightheaded, altered ms, nervous, Paranoia   Demerol [Meperidine] Other (See Comments) and Rash    agitated Agitation   Pravastatin Other (See Comments)    Muscle aches   Amiodarone Other (See Comments)    Per family pt had twitching while taking and cardiologist took pt off    Keppra [Levetiracetam]    Lipitor [Atorvastatin] Other (See Comments) and Itching    unknown   Nifedipine Other (See Comments)    unknown   Statins Rash    Makes her crazy   Sulfa Antibiotics Rash   Vancomycin Rash    deafness   Family History  Problem Relation Age of Onset   COPD Sister        had lung transplant   COPD Brother    Family history: Family history reviewed and not pertinent  Prior to Admission medications   Medication Sig Start Date End Date Taking? Authorizing Provider  acetaminophen (TYLENOL) 500 MG tablet Take 500-1,000 mg by mouth every 6 (six) hours as needed for mild pain or fever.     [provider]  albuterol (VENTOLIN HFA) 108 (90 Base) MCG/ACT inhaler Inhale 2 puffs into the lungs every 6 (six) hours as needed for wheezing or shortness of breath. 05/19/20   [provider]  apixaban (ELIQUIS) 5 MG TABS tablet Take 1 tablet (5 mg total) by mouth 2 (two) times daily. 05/22/21   Shelly Coss, MD  aspirin EC 81 MG tablet Take 1 tablet (81 mg total) by mouth daily. MAY RESUME ON 12/31/20 Patient taking differently: Take 81 mg by mouth daily. 12/31/20   Bonnielee Haff, MD  carvedilol (COREG) 25 MG tablet Take 25  mg by mouth 2 (two) times daily.     [provider]  gabapentin (NEURONTIN) 300 MG capsule Take 1 capsule (300 mg total) by mouth 2 (two) times daily. 02/07/22   Nita Sells, MD  hydrALAZINE (APRESOLINE) 25 MG tablet Take 1 tablet (25 mg total) by mouth 3 (three) times daily as needed (For systolic above 371). 10/30/21   Lorella Nimrod, MD  insulin glargine (LANTUS) 100 UNIT/ML injection Inject 0.35 mLs (35 Units total) into the skin at bedtime. Patient taking differently: Inject 55 Units into the skin at bedtime. 02/02/22   Sharen Hones, MD  insulin lispro (HUMALOG) 100 UNIT/ML injection Inject 0.08 mLs (8 Units total) into the skin 3 (three) times daily with meals. Patient taking differently: Inject 10 Units into the skin 3 (three) times daily with meals. 02/02/22   Sharen Hones, MD  losartan (COZAAR) 25 MG tablet Take 25 mg by mouth daily. 07/03/22   [provider]  Multiple Vitamin (MULTIVITAMIN WITH MINERALS) TABS tablet Take 1 tablet by mouth daily.    [provider]  omeprazole (PRILOSEC) 40 MG capsule Take 40 mg by mouth daily.     [provider]  oxybutynin (DITROPAN-XL) 10 MG 24 hr tablet Take 10 mg by mouth daily. 10/09/21   [provider]  PARoxetine (PAXIL) 40 MG tablet Take 1 tablet (40 mg total) by mouth at bedtime. 02/07/22   Nita Sells, MD  rosuvastatin (CRESTOR) 5 MG tablet Take 2.5 mg by mouth at  bedtime.    [provider]  sitaGLIPtin (JANUVIA) 50 MG tablet Take 50 mg by mouth daily.    [provider]  torsemide (DEMADEX) 20 MG tablet Take 1 tablet (20 mg total) by mouth daily. Please start from 11/01/2021 10/30/21   Lorella Nimrod, MD  traZODone (DESYREL) 50 MG tablet Take 1 tablet (50 mg total) by mouth at bedtime. Patient taking differently: Take 75 mg by mouth at bedtime. 02/07/22   Nita Sells, MD   Physical Exam: Vitals:   09/09/22 1420 09/09/22 1430 09/09/22 1432 09/09/22 1456  BP: (!) 114/44     Pulse: (!) 147 98 85 68  Resp: (!) _0 Temp:      TempSrc:      SpO2: 94% 96% 95% 91%  Weight:      Height:       Constitutional: appears frail, NAD, calm, comfortable Eyes: PERRL, lids and conjunctivae normal ENMT: Mucous membranes are moist. Posterior pharynx clear of any exudate or lesions. Age-appropriate dentition. Hearing appropriate Neck: normal, supple, no masses, no thyromegaly Respiratory: clear to auscultation bilaterally, no wheezing, no crackles. Normal respiratory effort. No accessory muscle use.  Cardiovascular: Regular rate and rhythm, no murmurs / rubs / gallops. No extremity edema. 2+ pedal pulses. No carotid bruits.  Abdomen: Morbidly obese abdomen, no tenderness, no masses palpated, no hepatosplenomegaly. Bowel sounds positive.  Musculoskeletal: no clubbing / cyanosis. No joint deformity upper and lower extremities. Good ROM, no contractures, no atrophy. Normal muscle tone.  Skin: no rashes, lesions, ulcers. No induration Neurologic: Sensation intact. Strength 5/5 in all 4.  Psychiatric: Normal judgment and insight. Alert and oriented x 3. Normal mood.   EKG: independently reviewed, showing sinus rhythm with rate of 75, QTc 453  Chest x-ray on Admission: I personally reviewed and I agree with radiologist reading as below.  DG Chest 2 View  Result Date: 09/09/2022 CLINICAL DATA:  chest pain EXAM: CHEST - 2 VIEW  COMPARISON:  June 06, 2022 FINDINGS: The cardiomediastinal silhouette is unchanged in contour. No pleural effusion. No pneumothorax. No acute pleuroparenchymal abnormality. Visualized abdomen is unremarkable. Orthopedic hardware of the thoracolumbar spine. Multilevel degenerative changes of the thoracic spine. IMPRESSION: No acute cardiopulmonary abnormality. Electronically Signed   By: Valentino Saxon M.D.   On: 09/09/2022 09:38    Labs on Admission: I have personally reviewed following labs CBC: Recent Labs  Lab 09/09/22 0912  WBC 7.2  HGB 12.4  HCT 38.9  MCV 92.0  PLT 680*   Basic Metabolic Panel: Recent Labs  Lab 09/09/22 0912  NA 142  K 3.9  CL 102  CO2 31  GLUCOSE 266*  BUN 43*  CREATININE 1.66*  CALCIUM 9.3   GFR: Estimated Creatinine Clearance: 31.6 mL/min (A) (by C-G formula based on SCr of 1.66 mg/dL (H)).  Coagulation Profile: Recent Labs  Lab 09/09/22 0912  INR 1.1   Thyroid Function Tests: Recent Labs    09/09/22 0912  TSH 3.309   Urine analysis:    Component Value Date/Time   COLORURINE YELLOW (A) 07/16/2022 1725   APPEARANCEUR CLEAR (A) 07/16/2022 1725   APPEARANCEUR Clear 06/29/2014 1138   LABSPEC 1.009 07/16/2022 1725   LABSPEC 1.019 06/29/2014 1138   PHURINE 5.0 07/16/2022 1725   GLUCOSEU NEGATIVE 07/16/2022 1725   GLUCOSEU Negative 06/29/2014 1138   HGBUR SMALL (A) 07/16/2022 1725   BILIRUBINUR NEGATIVE 07/16/2022 1725   BILIRUBINUR Negative 06/29/2014 [place] 07/16/2022 1725   PROTEINUR 30 (A) 07/16/2022 1725   UROBILINOGEN 0.2 12/14/2013 1737   NITRITE NEGATIVE 07/16/2022 1725   LEUKOCYTESUR NEGATIVE 07/16/2022 1725   LEUKOCYTESUR Negative 06/29/2014 1138   This document was prepared using Dragon Voice Recognition software and may include unintentional dictation errors.  Dr. Tobie Poet [facility]  If 7PM-7AM, please contact overnight-coverage provider If 7AM-7PM, please contact day coverage  provider [email or web address]  09/09/2022, 3:26 PM

## 2022-09-09 NOTE — Assessment & Plan Note (Signed)
At baseline 

## 2022-09-09 NOTE — Assessment & Plan Note (Signed)
-   This complicates overall care and prognosis.  

## 2022-09-09 NOTE — ED Provider Notes (Signed)
[facility] Provider Note    Event Date/Time   First MD Initiated Contact with Patient 09/09/22 [phone]     (approximate)   History   Chest Pain   HPI  Cheyenne Frank is a 81 y.o. female who reports she had a brief episode of chest pain last evening and again last night.  At least 1 of these and possibly both were associated with brief episodes of tachycardia.  Patient had another episode of tachycardia and chest tightness this morning that lasted longer.  She was short of breath with it and came in by EMS.  EMS reportedly saw her converted from A-fib with RVR to either a flutter or sinus at a slower rhythm.      Physical Exam   Triage Vital Signs: ED Triage Vitals  Enc Vitals Group     BP 09/09/22 0907 (!) 159/87     Pulse Rate 09/09/22 0907 (!) 135     Resp 09/09/22 0907 18     Temp 09/09/22 0907 98.4 F (36.9 C)     Temp Source 09/09/22 0907 Oral     SpO2 09/09/22 0907 100 %     Weight 09/09/22 0905 235 lb (106.6 kg)     Height 09/09/22 0905 5\' 3"  (1.6 m)     Head Circumference --      Peak Flow --      Pain Score 09/09/22 0904 0     Pain Loc --      Pain Edu? --      Excl. in GC? --     Most recent vital signs: Vitals:   09/09/22 1235 09/09/22 1254  BP: (!) 152/89   Pulse: (!) 125 (!) 128  Resp: 20 19  Temp:    SpO2: 92% 95%     General: Awake, initially complaining of chest tightness later this goes away when she converts to normal sinus rhythm.  Currently at 12:23, patient is intermittently in A-fib which is her usual state hide feels well. CV:  Good peripheral perfusion.  Heart regular rate and rhythm no audible murmurs Resp:  Normal effort.  Lungs are clear Abd:  No distention.  Soft and nontender Extremities: Slight trace edema   ED Results / Procedures / Treatments   Labs (all labs ordered are listed, but only abnormal results are displayed) Labs Reviewed  BASIC METABOLIC PANEL - Abnormal; Notable for the following  components:      Result Value   Glucose, Bld 266 (*)    BUN 43 (*)    Creatinine, Ser 1.66 (*)    GFR, Estimated 31 (*)    All other components within normal limits  CBC - Abnormal; Notable for the following components:   Platelets 128 (*)    All other components within normal limits  TROPONIN I (HIGH SENSITIVITY) - Abnormal; Notable for the following components:   Troponin I (High Sensitivity) 18 (*)    All other components within normal limits  PROTIME-INR  TROPONIN I (HIGH SENSITIVITY)     EKG EKG #1 read and interpreted by me shows A-fib with RVR at a rate of 126 normal axis with appear to be rate related changes EKG #2 read and interpreted by me shows normal sinus rhythm rate of 75 normal axis EKG shows some ST T-segment changes but these are similar to EKG from 10 May of this year.   RADIOLOGY Chest x-ray read and interpreted by me shows no acute changes radiology reviews  these films and agrees   PROCEDURES:  Critical Care performed:   Procedures   MEDICATIONS ORDERED IN ED: Medications  diltiazem (CARDIZEM) 125 mg in dextrose 5% 125 mL (1 mg/mL) infusion (has no administration in time range)  diltiazem (CARDIZEM) injection 5 mg (5 mg Intravenous Given 09/09/22 1250)  diltiazem (CARDIZEM) injection 10 mg (10 mg Intravenous Given 09/09/22 1259)     IMPRESSION / MDM / ASSESSMENT AND PLAN / ED COURSE  I reviewed the triage vital signs and the nursing notes. ----------------------------------------- 12:26 PM on 09/09/2022 ----------------------------------------- Discussed the patient with Dr. Adrian Blackwater, cardiology.  He asked me to place the patient on isosorbide mononitrate 30 mg one half a pill daily.  He was going to do 30 mg daily her last blood pressure was somewhat low at 112/49.  So he cut the dose in half.  Will try dose here in the ER and see how she tolerates that and let her go.  Differential diagnosis includes, but is not limited to, MI (STEMI versus  NSTEMI) CHF symptoms related only to A-fib with RVR which appears to be the case.  Patient's presentation is most consistent with acute presentation with potential threat to life or bodily function.  The patient is on the cardiac monitor to evaluate for evidence of arrhythmia and/or significant heart rate changes.  Intermittent episodes of A-fib with RVR which is what the patient has had in the past  I considered admitting the patient but the patient's troponins are negative she wants to go home and she now feels fine EKG changes appear to have resolved.  I can get her outpatient follow-up fairly closely.  ----------------------------------------- 12:39 PM on 09/09/2022 ----------------------------------------- Patient is now back in A-fib again.  She is at her maximum dose of Coreg.  She is not currently having chest pain but family is worried about sending her back home as am I.  I have given her her diltiazem 5 mg IV with no result 10 mg IV with good result I will start her on Dilt drip now.  I will consult with the hospitalist   FINAL CLINICAL IMPRESSION(S) / ED DIAGNOSES   Final diagnoses:  Other forms of angina pectoris  Atrial fibrillation with RVR (HCC)     Rx / DC Orders   ED Discharge Orders     None        Note:  This document was prepared using Dragon voice recognition software and may include unintentional dictation errors.   Arnaldo Natal, MD 09/09/22 [phone]

## 2022-09-09 NOTE — ED Triage Notes (Signed)
Pt states she has been having CP the last 2 days- pt denies CP right now- pt did take 6 ASA and per EMS converted to afib- pt states she does take a blood thinner but cannot remember which one

## 2022-09-09 NOTE — Assessment & Plan Note (Signed)
-   Etiology workup in progress, check TSH, procalcitonin, UA with complete microscopy - Cardiology, Dr. Welton Flakes recommended initiating Imdur 15 mg p.o. one-time dose which was given in the ED - Complete echo ordered, pending echo results I would recommend a.m. team consult cardiology as needed - Admit to progressive cardiac, observation

## 2022-09-09 NOTE — Assessment & Plan Note (Signed)
-   Presumed secondary to atrial fibrillation with RVR, etiology workup in progress

## 2022-09-09 NOTE — Assessment & Plan Note (Addendum)
-   A1c check on 07/16/2022 showed level of 9.1. She gets 55 lantus qhs - I did counsel that eating sweets and not following a diabetic diet does increase patient's sugar level and I recognize that she is 81 years old and acknowledged that her sugar intake may be her only form of pleasure.  Her daughters agree that this is her only form of pleasure and they did not want to hit her her consuming sugar/sweet products. - I did counsel that if we continue with not following a diabetic diet, I recommend that her sugar is checked 3 times daily AC given that she is on high-dose of insulin and sulfonylurea.  I stated that my concern is for uncontrolled hyperglycemia and especially for hypoglycemia that can lead to comatose and death.  Daughters acknowledge this information and endorses compliance. - Long-acting insulin, 55 units nightly resumed - Insulin SSI with at bedtime coverage ordered, obesity dosing - Goal inpatient blood glucose level is 140-180

## 2022-09-09 NOTE — ED Notes (Signed)
Dilt restarted per VO from Dr Sedalia Muta

## 2022-09-09 NOTE — ED Notes (Addendum)
Pt arrived with complaint of chest pain. Pt states "I dont feel too good." Pt took 6 ASA PTA. Pt states her chest felt tight. Pt denies SOB or CP at this time. Pt placed on cardiac monitor.

## 2022-09-09 NOTE — ED Triage Notes (Signed)
First RN:  Pt BIB [facility] due to chest pain from home x2 days. Pt took 6-81mg  ASA. Pt converted from aflutter at 130 to afib at 85 by herself while in the ambulance.   EMS Vitals: 229 CBG 152/80 BP 130 HR a-flutter. 100% RA Pt not complaining of pain since self converting.

## 2022-09-09 NOTE — Hospital Course (Addendum)
Cheyenne Frank is a 81 year old female with history hypertension, atrial fibrillation, depression, anxiety, GERD, hyperlipidemia, who presents to emergency department for chief concerns of chest pain and shortness of breath.  Initial vitals in the emergency department showed temperature of 98.4, respiration rate of 18, heart rate of 86, blood pressure 126/92, SpO2 of 100% on room air.  Serum sodium is 142, potassium 2.9, chloride 102, bicarb 31, BUN of 43, serum creatinine of 1.66, EGFR of 31, WBC 7.2, hemoglobin 12.6, platelets of 126.  High sensitive troponin was 14 and on repeat was 18.  EDP discussed case with Dr. Humphrey Rolls, cardiology on-call who recommends starting patient on Imdur.  ED treatment: Diltiazem 10 mg IV one-time dose, 5 mg IV one-time dose, isosorbide mononitrate 15 mg p.o. one-time dose, and patient was placed on diltiazem drip.

## 2022-09-09 NOTE — ED Notes (Signed)
Dr Sedalia Muta notified of pts HR, awaiting orders at this time

## 2022-09-09 NOTE — Assessment & Plan Note (Signed)
-   Check a UA with complete microscopy - Differentials include urinary tract infection versus hyperglycemia in setting of 81 year old female with dementia not being able to follow diabetic diet

## 2022-09-09 NOTE — Assessment & Plan Note (Signed)
-   Resumed home Coreg 25 mg p.o. twice daily

## 2022-09-09 NOTE — Assessment & Plan Note (Signed)
-   Presumed secondary to A-fib, treat per A-fib with

## 2022-09-10 ENCOUNTER — Observation Stay
Admit: 2022-09-10 | Discharge: 2022-09-10 | Disposition: A | Discharge status: Home / Self Care | Payer: Medicare Other | Attending: Internal Medicine | Admitting: Internal Medicine

## 2022-09-10 DIAGNOSIS — I480 Paroxysmal atrial fibrillation: Secondary | ICD-10-CM | POA: Diagnosis not present

## 2022-09-10 DIAGNOSIS — I4891 Unspecified atrial fibrillation: Secondary | ICD-10-CM | POA: Diagnosis not present

## 2022-09-10 LAB — ECHOCARDIOGRAM COMPLETE
AR max vel: 2.5 cm2
AV Area VTI: 2.54 cm2
AV Area mean vel: 2.48 cm2
AV Mean grad: 4 mmHg
AV Peak grad: 7.3 mmHg
Ao pk vel: 1.36 m/s
Area-P 1/2: 3 cm2
Calc EF: 77.2 %
Height: 63 in
MV VTI: 2.1 cm2
S' Lateral: 2.6 cm
Single Plane A2C EF: 79.5 %
Single Plane A4C EF: 74.3 %
Weight: 3760 oz

## 2022-09-10 LAB — CBG MONITORING, ED
Glucose-Capillary: 126 mg/dL — ABNORMAL HIGH (ref 70–99)
Glucose-Capillary: 150 mg/dL — ABNORMAL HIGH (ref 70–99)
Glucose-Capillary: 280 mg/dL — ABNORMAL HIGH (ref 70–99)

## 2022-09-10 MED ORDER — DILTIAZEM HCL ER COATED BEADS 120 MG PO CP24: 120.0000 mg | ORAL_CAPSULE | Freq: Every day | ORAL | 11 refills | Status: DC

## 2022-09-10 MED ORDER — NYSTATIN 100000 UNIT/GM EX POWD
Freq: Every day | CUTANEOUS | Status: DC
  Filled 2022-09-10: qty 15

## 2022-09-10 NOTE — ED Notes (Signed)
Bishop Limbo, NP notified of BP and HR prior to carvedilol dose. States to continue and give medication

## 2022-09-10 NOTE — Discharge Summary (Signed)
Physician Discharge Summary  Cheyenne Frank WUX:[id number] DOB: 13-Jun-1941 DOA: 09/09/2022  PCP: [facility]  Admit date: 09/09/2022  Discharge date: 09/10/2022  Admitted From: Home.  Disposition:  Home.  Recommendations for Outpatient Follow-up:  Follow up with PCP in 1-2 weeks. Please obtain BMP/CBC in one week. 3 .  Advised to follow-up with cardiology as scheduled. 4.   Advised to take Cardizem CD 120 mg daily for atrial fibrillation  Home Health:None Equipment/Devices:Home oxygen  Discharge Condition: Stable CODE STATUS:Full code Diet recommendation: Heart Healthy   [facility] Course: This 81 year old female with PMH significant for hypertension, atrial fibrillation, depression, anxiety, GERD, hyperlipidemia, who presents to emergency department for chief concerns of chest pain and shortness of breath.Patient was found to have A-fib with RVR and was given Cardizem,  Imdur and was placed on Cardizem drip.Cardiology was consulted.  Heart rate well-controlled with Coreg.  Patient was evaluated by cardiology,  started patient on Cardizem CD 120 mg for additional heart rate control. Patient is cleared from cardiology to be discharged. Patient will follow-up with Dr. Darrold Junker in 1 week for outpatient ischemic workup.  Echocardiogram was unremarkable. Patient feels better and patient is being discharged home  Discharge Diagnoses:  Principal Problem:   Atrial fibrillation with RVR (HCC) Active Problems:   Paroxysmal atrial fibrillation (HCC)   Uncontrolled type 2 diabetes mellitus with hyperglycemia, with long-term current use of insulin (HCC)   Stage 3b chronic kidney disease (CKD) (HCC)   Essential hypertension   Sleep apnea   Obesity, Class III, BMI 40-49.9 (morbid obesity) (HCC)   Depression   History of CVA (cerebrovascular accident)   Dementia without behavioral disturbance (HCC)   Chest pain   Elevated serum creatinine   Polyuria    Elevated troponin    Discharge Instructions  Discharge Instructions     Call MD for:  persistant dizziness or light-headedness   Complete by: As directed    Call MD for:  persistant nausea and vomiting   Complete by: As directed    Call MD for:  redness, tenderness, or signs of infection (pain, swelling, redness, odor or green/yellow discharge around incision site)   Complete by: As directed    Diet - low sodium heart healthy   Complete by: As directed    Diet Carb Modified   Complete by: As directed    Discharge instructions   Complete by: As directed    Advised to follow-up with primary care physician in 1 week. Advised to follow-up with cardiology as scheduled. Advised to take Cardizem CD 120 mg daily for atrial fibrillation   Increase activity slowly   Complete by: As directed       Allergies as of 09/10/2022       Reactions   Codeine Palpitations   Other Reaction: RACES HEART   Dilaudid [hydromorphone] Other (See Comments), Rash   Other Reaction: lightheaded, altered ms, nervous, Paranoia paranoid Other Reaction: lightheaded, altered ms, nervous, Paranoia   Demerol [meperidine] Other (See Comments), Rash   agitated Agitation   Pravastatin Other (See Comments)   Muscle aches   Amiodarone Other (See Comments)   Per family pt had twitching while taking and cardiologist took pt off    Keppra [levetiracetam]    Lipitor [atorvastatin] Other (See Comments), Itching   unknown   Nifedipine Other (See Comments)   unknown   Statins Rash   Makes her crazy   Sulfa Antibiotics Rash   Vancomycin Rash   deafness  Medication List     TAKE these medications    acetaminophen 500 MG tablet Commonly known as: TYLENOL Take 500-1,000 mg by mouth every 6 (six) hours as needed for mild pain or fever.   albuterol 108 (90 Base) MCG/ACT inhaler Commonly known as: VENTOLIN HFA Inhale 2 puffs into the lungs every 6 (six) hours as needed for wheezing or shortness of  breath.   apixaban 5 MG Tabs tablet Commonly known as: Eliquis Take 1 tablet (5 mg total) by mouth 2 (two) times daily.   aspirin EC 81 MG tablet Take 1 tablet (81 mg total) by mouth daily. MAY RESUME ON 12/31/20 What changed: additional instructions   carvedilol 25 MG tablet Commonly known as: COREG Take 25 mg by mouth 2 (two) times daily.   diltiazem 120 MG 24 hr capsule Commonly known as: Cardizem CD Take 1 capsule (120 mg total) by mouth daily.   gabapentin 300 MG capsule Commonly known as: NEURONTIN Take 1 capsule (300 mg total) by mouth 2 (two) times daily.   hydrALAZINE 25 MG tablet Commonly known as: APRESOLINE Take 1 tablet (25 mg total) by mouth 3 (three) times daily as needed (For systolic above 160).   insulin glargine 100 UNIT/ML injection Commonly known as: LANTUS Inject 0.35 mLs (35 Units total) into the skin at bedtime. What changed: how much to take   insulin lispro 100 UNIT/ML injection Commonly known as: HumaLOG Inject 0.08 mLs (8 Units total) into the skin 3 (three) times daily with meals. What changed: how much to take   losartan 25 MG tablet Commonly known as: COZAAR Take 25 mg by mouth daily.   multivitamin with minerals Tabs tablet Take 1 tablet by mouth daily.   omeprazole 40 MG capsule Commonly known as: PRILOSEC Take 40 mg by mouth daily.   oxybutynin 10 MG 24 hr tablet Commonly known as: DITROPAN-XL Take 10 mg by mouth daily.   PARoxetine 40 MG tablet Commonly known as: PAXIL Take 1 tablet (40 mg total) by mouth at bedtime.   rosuvastatin 5 MG tablet Commonly known as: CRESTOR Take 2.5 mg by mouth at bedtime.   sitaGLIPtin 50 MG tablet Commonly known as: JANUVIA Take 50 mg by mouth daily.   torsemide 20 MG tablet Commonly known as: DEMADEX Take 1 tablet (20 mg total) by mouth daily. Please start from 11/01/2021   traZODone 50 MG tablet Commonly known as: DESYREL Take 1 tablet (50 mg total) by mouth at bedtime. What  changed: how much to take        Follow-up Information     [place] Follow up in 1 week(s).   Specialty: Family Medicine Contact information: [address] [place] [address] [phone]         Marcina Millard, MD Follow up in 1 week(s).   Specialty: Cardiology Contact information: [address] [facility] [place] [address] [phone]                Allergies  Allergen Reactions   Codeine Palpitations    Other Reaction: RACES HEART   Dilaudid [Hydromorphone] Other (See Comments) and Rash    Other Reaction: lightheaded, altered ms, nervous, Paranoia paranoid Other Reaction: lightheaded, altered ms, nervous, Paranoia   Demerol [Meperidine] Other (See Comments) and Rash    agitated Agitation   Pravastatin Other (See Comments)    Muscle aches   Amiodarone Other (See Comments)    Per family pt had twitching  while taking and cardiologist took pt off    Keppra [Levetiracetam]    Lipitor [Atorvastatin] Other (See Comments) and Itching    unknown   Nifedipine Other (See Comments)    unknown   Statins Rash    Makes her crazy   Sulfa Antibiotics Rash   Vancomycin Rash    deafness    Consultations: Cardiology   Procedures/Studies: ECHOCARDIOGRAM COMPLETE  Result Date: 09/10/2022    ECHOCARDIOGRAM REPORT   Patient Name:   KORTNI HASTEN Date of Exam: 09/10/2022 Medical Rec #:  [id number]       Height:       63.0 in Accession #:    [id number]      Weight:       235.0 lb Date of Birth:  1941/03/11        BSA:          2.071 m Patient Age:    81 years        BP:           137/49 mmHg Patient Gender: F               HR:           59 bpm. Exam Location:  [facility] Procedure: 2D Echo, Cardiac Doppler and Color Doppler Indications:     Elevated Tropnin; Dyspnea; Chest Pain  History:         Patient has prior history of Echocardiogram examinations.                  Stroke; Risk  Factors:Hypertension and Diabetes.  Sonographer:     L. Thornton-Maynard Referring Phys:  [id number] AMY N COX Diagnosing Phys: Adrian Blackwater IMPRESSIONS  1. Left ventricular ejection fraction, by estimation, is 70 to 75%. The left ventricle has hyperdynamic function. The left ventricle has no regional wall motion abnormalities. There is moderate concentric left ventricular hypertrophy. Left ventricular diastolic parameters are consistent with Grade II diastolic dysfunction (pseudonormalization).  2. Right ventricular systolic function is normal. The right ventricular size is normal.  3. Left atrial size was mildly dilated.  4. Right atrial size was mildly dilated.  5. The mitral valve is normal in structure. Mild mitral valve regurgitation. No evidence of mitral stenosis.  6. The aortic valve is normal in structure. Aortic valve regurgitation is mild. Aortic valve sclerosis/calcification is present, without any evidence of aortic stenosis.  7. The inferior vena cava is normal in size with greater than 50% respiratory variability, suggesting right atrial pressure of 3 mmHg. Conclusion(s)/Recommendation(s): No intracardiac source of embolism detected on this transthoracic study. Consider a transesophageal echocardiogram to exclude cardiac source of embolism if clinically indicated. FINDINGS  Left Ventricle: Left ventricular ejection fraction, by estimation, is 70 to 75%. The left ventricle has hyperdynamic function. The left ventricle has no regional wall motion abnormalities. The left ventricular internal cavity size was normal in size. There is moderate concentric left ventricular hypertrophy. Left ventricular diastolic parameters are consistent with Grade II diastolic dysfunction (pseudonormalization). Right Ventricle: The right ventricular size is normal. No increase in right ventricular wall thickness. Right ventricular systolic function is normal. Left Atrium: Left atrial size was mildly dilated. Right Atrium:  Right atrial size was mildly dilated. Pericardium: There is no evidence of pericardial effusion. Mitral Valve: The mitral valve is normal in structure. Mild mitral valve regurgitation. No evidence of mitral valve stenosis. MV peak gradient, 5.6 mmHg. The mean mitral valve gradient is 2.0 mmHg. Tricuspid Valve: The tricuspid  valve is normal in structure. Tricuspid valve regurgitation is trivial. No evidence of tricuspid stenosis. Aortic Valve: The aortic valve is normal in structure. Aortic valve regurgitation is mild. Aortic valve sclerosis/calcification is present, without any evidence of aortic stenosis. Aortic valve mean gradient measures 4.0 mmHg. Aortic valve peak gradient measures 7.3 mmHg. Aortic valve area, by VTI measures 2.54 cm. Pulmonic Valve: The pulmonic valve was normal in structure. Pulmonic valve regurgitation is not visualized. No evidence of pulmonic stenosis. Aorta: The aortic root is normal in size and structure. Venous: The inferior vena cava is normal in size with greater than 50% respiratory variability, suggesting right atrial pressure of 3 mmHg. IAS/Shunts: No atrial level shunt detected by color flow Doppler.  LEFT VENTRICLE PLAX 2D LVIDd:         4.10 cm     Diastology LVIDs:         2.60 cm     LV e' medial:    7.72 cm/s LV PW:         1.60 cm     LV E/e' medial:  16.3 LV IVS:        1.60 cm     LV e' lateral:   4.90 cm/s LVOT diam:     2.00 cm     LV E/e' lateral: 25.7 LV SV:         81 LV SV Index:   39 LVOT Area:     3.14 cm  LV Volumes (MOD) LV vol d, MOD A2C: 59.6 ml LV vol d, MOD A4C: 58.3 ml LV vol s, MOD A2C: 12.2 ml LV vol s, MOD A4C: 15.0 ml LV SV MOD A2C:     47.4 ml LV SV MOD A4C:     58.3 ml LV SV MOD BP:      46.6 ml RIGHT VENTRICLE RV Basal diam:  2.70 cm RV S prime:     12.30 cm/s TAPSE (M-mode): 1.5 cm LEFT ATRIUM             Index        RIGHT ATRIUM           Index LA diam:        4.60 cm 2.22 cm/m   RA Area:     13.50 cm LA Vol (A2C):   52.9 ml 25.55 ml/m  RA  Volume:   32.70 ml  15.79 ml/m LA Vol (A4C):   75.0 ml 36.22 ml/m LA Biplane Vol: 62.8 ml 30.33 ml/m  AORTIC VALVE                    PULMONIC VALVE AV Area (Vmax):    2.50 cm     PV Vmax:          1.20 m/s AV Area (Vmean):   2.48 cm     PV Peak grad:     5.8 mmHg AV Area (VTI):     2.54 cm     PR End Diast Vel: 5.86 msec AV Vmax:           135.50 cm/s AV Vmean:          94.400 cm/s AV VTI:            0.318 m AV Peak Grad:      7.3 mmHg AV Mean Grad:      4.0 mmHg LVOT Vmax:         108.00 cm/s LVOT Vmean:  74.400 cm/s LVOT VTI:          0.257 m LVOT/AV VTI ratio: 0.81  AORTA Ao Root diam: 2.90 cm Ao Asc diam:  3.00 cm MITRAL VALVE MV Area (PHT): 3.00 cm     SHUNTS MV Area VTI:   2.10 cm     Systemic VTI:  0.26 m MV Peak grad:  5.6 mmHg     Systemic Diam: 2.00 cm MV Mean grad:  2.0 mmHg MV Vmax:       1.18 m/s MV Vmean:      73.9 cm/s MV Decel Time: 253 msec MV E velocity: 126.00 cm/s MV A velocity: 95.90 cm/s MV E/A ratio:  1.31 Shaukat Khan Electronically signed by Adrian Blackwater Signature Date/Time: 09/10/2022/12:26:06 PM    Final    DG Chest 2 View  Result Date: 09/09/2022 CLINICAL DATA:  chest pain EXAM: CHEST - 2 VIEW COMPARISON:  June 06, 2022 FINDINGS: The cardiomediastinal silhouette is unchanged in contour. No pleural effusion. No pneumothorax. No acute pleuroparenchymal abnormality. Visualized abdomen is unremarkable. Orthopedic hardware of the thoracolumbar spine. Multilevel degenerative changes of the thoracic spine. IMPRESSION: No acute cardiopulmonary abnormality. Electronically Signed   By: Meda Klinefelter M.D.   On: 09/09/2022 09:38     Subjective: Patient was seen and examined at bedside.  Overnight events noted.   Patient reports doing much better and wants to be discharged.   She denies any chest pain.  Heart rate is well-controlled.  Discharge Exam: Vitals:   09/10/22 1235 09/10/22 1300  BP: (!) 128/52 (!) 126/47  Pulse: 65 65  Resp: 19 (!) 21  Temp: 97.9  F (36.6 C) 98 F (36.7 C)  SpO2: 100% 95%   Vitals:   09/10/22 1030 09/10/22 1100 09/10/22 1235 09/10/22 1300  BP: (!) 118/46 (!) 117/50 (!) 128/52 (!) 126/47  Pulse: 62 63 65 65  Resp: 15 11 19  (!) 21  Temp:   97.9 F (36.6 C) 98 F (36.7 C)  TempSrc:      SpO2: 95% 98% 100% 95%  Weight:      Height:        General: Pt is alert, awake, not in acute distress Cardiovascular: RRR, S1/S2 +, no rubs, no gallops Respiratory: CTA bilaterally, no wheezing, no rhonchi Abdominal: Soft, NT, ND, bowel sounds + Extremities: no edema, no cyanosis    The results of significant diagnostics from this hospitalization (including imaging, microbiology, ancillary and laboratory) are listed below for reference.     Microbiology: No results found for this or any previous visit (from the past 240 hour(s)).   Labs: BNP (last 3 results) Recent Labs    09/16/21 1339  BNP 64.9   Basic Metabolic Panel: Recent Labs  Lab 09/09/22 0912  NA 142  K 3.9  CL 102  CO2 31  GLUCOSE 266*  BUN 43*  CREATININE 1.66*  CALCIUM 9.3   Liver Function Tests: No results for input(s): "AST", "ALT", "ALKPHOS", "BILITOT", "PROT", "ALBUMIN" in the last 168 hours. No results for input(s): "LIPASE", "AMYLASE" in the last 168 hours. No results for input(s): "AMMONIA" in the last 168 hours. CBC: Recent Labs  Lab 09/09/22 0912  WBC 7.2  HGB 12.4  HCT 38.9  MCV 92.0  PLT 128*   Cardiac Enzymes: No results for input(s): "CKTOTAL", "CKMB", "CKMBINDEX", "TROPONINI" in the last 168 hours. BNP: Invalid input(s): "POCBNP" CBG: Recent Labs  Lab 09/09/22 1745 09/09/22 2148 09/10/22 0029 09/10/22 0802 09/10/22 1235  GLUCAP 135* 138*  150* 126* 280*   D-Dimer No results for input(s): "DDIMER" in the last 72 hours. Hgb A1c No results for input(s): "HGBA1C" in the last 72 hours. Lipid Profile No results for input(s): "CHOL", "HDL", "LDLCALC", "TRIG", "CHOLHDL", "LDLDIRECT" in the last 72  hours. Thyroid function studies Recent Labs    09/09/22 0912  TSH 3.309   Anemia work up No results for input(s): "VITAMINB12", "FOLATE", "FERRITIN", "TIBC", "IRON", "RETICCTPCT" in the last 72 hours. Urinalysis    Component Value Date/Time   COLORURINE STRAW (A) 09/09/2022 1517   APPEARANCEUR CLEAR (A) 09/09/2022 1517   APPEARANCEUR Clear 06/29/2014 1138   LABSPEC 1.008 09/09/2022 1517   LABSPEC 1.019 06/29/2014 1138   PHURINE 5.0 09/09/2022 1517   GLUCOSEU NEGATIVE 09/09/2022 1517   GLUCOSEU Negative 06/29/2014 1138   HGBUR SMALL (A) 09/09/2022 1517   BILIRUBINUR NEGATIVE 09/09/2022 1517   BILIRUBINUR Negative 06/29/2014 1138   KETONESUR NEGATIVE 09/09/2022 1517   PROTEINUR 30 (A) 09/09/2022 1517   UROBILINOGEN 0.2 12/14/2013 1737   NITRITE NEGATIVE 09/09/2022 1517   LEUKOCYTESUR NEGATIVE 09/09/2022 1517   LEUKOCYTESUR Negative 06/29/2014 1138   Sepsis Labs Recent Labs  Lab 09/09/22 0912  WBC 7.2   Microbiology No results found for this or any previous visit (from the past 240 hour(s)).   Time coordinating discharge: Over 30 minutes  SIGNED:   Cipriano BunkerPARDEEP Adriyanna Christians, MD  [facility] 09/10/2022, 3:30 PM Pager   If 7PM-7AM, please contact night-coverage

## 2022-09-10 NOTE — ED Notes (Signed)
Pt placed on bedpan

## 2022-09-10 NOTE — ED Notes (Signed)
Pt discharge to home. Pt VSS, GCS 15, NAD. Pt verbalized understanding of discharge instructions with no additional questions at this time.  

## 2022-09-10 NOTE — ED Notes (Signed)
Pt cleaned of incontinence. Pt has redness and and rash noted in abdominal folds and creases of groin. MD Lucianne Muss notified.

## 2022-09-10 NOTE — ED Notes (Signed)
Cardizem drip stopped due to patient HR maintaining in the 50s and 60s

## 2022-09-10 NOTE — ED Notes (Signed)
BS 150

## 2022-09-10 NOTE — Discharge Instructions (Signed)
Advised to follow-up with primary care physician in 1 week. Advised to follow-up with cardiology as scheduled. Advised to take Cardizem CD 120 mg daily for atrial fibrillation

## 2022-09-10 NOTE — ED Notes (Signed)
RN contacted patient daughter for pick up for discharge. Daughter on her way now.

## 2022-09-10 NOTE — ED Notes (Signed)
Report given to Alexys, RN  

## 2022-09-10 NOTE — Consult Note (Signed)
Cheyenne Frank is a 81 y.o. female  [id number]  Primary Cardiologist: [place] [facility] Reason for Consultation: Atrial fibrillation with rapid ventricular response rate  HPI: 81 year old white female with a past medical history of chronic kidney disease and atrial fibrillation presented to the hospital with chest pain palpitation and fluttering.  Patient was found to be in atrial fibrillation with rapid ventricular response rate.   Review of Systems: Patient denies any chest pain at this time but when she has A-fib he gets tightness in the chest   Past Medical History:  Diagnosis Date   AKI (acute kidney injury) (HCC)    Anemia of chronic disease    Anxiety    Arthritis    Asthma    no problems since 1999 (after moving into a new home)   Chronic airway obstruction (HCC)    Complication of anesthesia    " feel like I'm dying after I wake up"   Diabetes mellitus without complication (HCC)    Diverticulosis    Dysrhythmia    unknown type    Fibromyalgia    Flat back syndrome    GERD (gastroesophageal reflux disease)    Hx of transfusion 2011   Hypertension    Itching    Macular degeneration    Nocturia    Postoperative anemia due to acute blood loss 04/09/2013   Restless leg syndrome    Sleep apnea    Dx 1990's unable to wear c-pap -   Stroke (HCC) 2010   verbal aphasia x 30 min - resolved - no problem since then    (Not in a hospital admission)     apixaban  5 mg Oral BID   aspirin EC  81 mg Oral Daily   carvedilol  25 mg Oral BID   gabapentin  300 mg Oral BID   insulin aspart  0-20 Units Subcutaneous TID WC   insulin aspart  0-5 Units Subcutaneous QHS   insulin glargine-yfgn  55 Units Subcutaneous QHS   losartan  25 mg Oral Daily   multivitamin with minerals  1 tablet Oral Daily   nystatin   Topical Daily   oxybutynin  10 mg Oral QHS   pantoprazole  80 mg Oral Daily   PARoxetine  40 mg Oral QHS   rosuvastatin  2.5 mg Oral QHS   torsemide  20 mg Oral Daily     Infusions:  diltiazem (CARDIZEM) infusion Stopped (09/10/22 0540)    Allergies  Allergen Reactions   Codeine Palpitations    Other Reaction: RACES HEART   Dilaudid [Hydromorphone] Other (See Comments) and Rash    Other Reaction: lightheaded, altered ms, nervous, Paranoia paranoid Other Reaction: lightheaded, altered ms, nervous, Paranoia   Demerol [Meperidine] Other (See Comments) and Rash    agitated Agitation   Pravastatin Other (See Comments)    Muscle aches   Amiodarone Other (See Comments)    Per family pt had twitching while taking and cardiologist took pt off    Keppra [Levetiracetam]    Lipitor [Atorvastatin] Other (See Comments) and Itching    unknown   Nifedipine Other (See Comments)    unknown   Statins Rash    Makes her crazy   Sulfa Antibiotics Rash   Vancomycin Rash    deafness    Social History   Socioeconomic History   Marital status: Single    Spouse name: Not on file   Number of children: Not on file   Years of education: Not  on file   Highest education level: Not on file  Occupational History   Not on file  Tobacco Use   Smoking status: Never   Smokeless tobacco: Never  Substance and Sexual Activity   Alcohol use: No    Alcohol/week: 0.0 standard drinks of alcohol   Drug use: No   Sexual activity: Not Currently  Other Topics Concern   Not on file  Social History Narrative   Not on file   Social Determinants of Health   Financial Resource Strain: Not on file  Food Insecurity: No Food Insecurity (07/18/2022)   Hunger Vital Sign    Worried About Running Out of Food in the Last Year: Never true    Ran Out of Food in the Last Year: Never true  Transportation Needs: No Transportation Needs (07/18/2022)   PRAPARE - Hydrologist (Medical): No    Lack of Transportation (Non-Medical): No  Physical Activity: Not on file  Stress: Not on file  Social Connections: Not on file  Intimate Partner Violence: Not At  Risk (07/18/2022)   Humiliation, Afraid, Rape, and Kick questionnaire    Fear of Current or Ex-Partner: No    Emotionally Abused: No    Physically Abused: No    Sexually Abused: No    Family History  Problem Relation Age of Onset   COPD Sister        had lung transplant   COPD Brother     PHYSICAL EXAM: Vitals:   09/10/22 1000 09/10/22 1030  BP: (!) 122/44 (!) 118/46  Pulse: (!) 58 62  Resp: 15 15  Temp:    SpO2: 100% 95%     Intake/Output Summary (Last 24 hours) at 09/10/2022 1205 Last data filed at 09/10/2022 0541 Gross per 24 hour  Intake 159.5 ml  Output 900 ml  Net -740.5 ml    General:  Well appearing. No respiratory difficulty HEENT: normal Neck: supple. no JVD. Carotids 2+ bilat; no bruits. No lymphadenopathy or thryomegaly appreciated. Cor: PMI nondisplaced. Regular rate & rhythm. No rubs, gallops or murmurs. Lungs: clear Abdomen: soft, nontender, nondistended. No hepatosplenomegaly. No bruits or masses. Good bowel sounds. Extremities: no cyanosis, clubbing, rash, edema Neuro: alert & oriented x 3, cranial nerves grossly intact. moves all 4 extremities w/o difficulty. Affect pleasant.  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  Results for orders placed or performed during the hospital encounter of 09/09/22 (from the past 24 hour(s))  Urinalysis, Complete w Microscopic Urine, Clean Catch     Status: Abnormal   Collection Time: 09/09/22  3:17 PM  Result Value Ref Range   Color, Urine STRAW (A) YELLOW   APPearance CLEAR (A) CLEAR   Specific Gravity, Urine 1.008 1.005 - 1.030   pH 5.0 5.0 - 8.0   Glucose, UA NEGATIVE NEGATIVE mg/dL   Hgb urine dipstick SMALL (A) NEGATIVE   Bilirubin Urine NEGATIVE NEGATIVE   Ketones, ur NEGATIVE NEGATIVE mg/dL   Protein, ur 30 (A) NEGATIVE mg/dL   Nitrite NEGATIVE NEGATIVE   Leukocytes,Ua NEGATIVE NEGATIVE   RBC / HPF 0-5 0 - 5 RBC/hpf    WBC, UA NONE SEEN 0 - 5 WBC/hpf   Bacteria, UA RARE (A) NONE SEEN   Squamous Epithelial / LPF 0-5 0 - 5   Hyaline Casts, UA PRESENT   CBG monitoring, ED     Status: Abnormal   Collection Time: 09/09/22  5:45  PM  Result Value Ref Range   Glucose-Capillary 135 (H) 70 - 99 mg/dL  CBG monitoring, ED     Status: Abnormal   Collection Time: 09/09/22  9:48 PM  Result Value Ref Range   Glucose-Capillary 138 (H) 70 - 99 mg/dL  CBG monitoring, ED     Status: Abnormal   Collection Time: 09/10/22 12:29 AM  Result Value Ref Range   Glucose-Capillary 150 (H) 70 - 99 mg/dL  CBG monitoring, ED     Status: Abnormal   Collection Time: 09/10/22  8:02 AM  Result Value Ref Range   Glucose-Capillary 126 (H) 70 - 99 mg/dL   DG Chest 2 View  Result Date: 09/09/2022 CLINICAL DATA:  chest pain EXAM: CHEST - 2 VIEW COMPARISON:  June 06, 2022 FINDINGS: The cardiomediastinal silhouette is unchanged in contour. No pleural effusion. No pneumothorax. No acute pleuroparenchymal abnormality. Visualized abdomen is unremarkable. Orthopedic hardware of the thoracolumbar spine. Multilevel degenerative changes of the thoracic spine. IMPRESSION: No acute cardiopulmonary abnormality. Electronically Signed   By: Valentino Saxon M.D.   On: 09/09/2022 09:38     ASSESSMENT AND PLAN: Paroxysmal atrial fibrillation with rapid ventricular response rate currently in normal sinus rhythm 69 bpm.  Patient is allergic to amiodarone thus cannot put the patient on any antiarrhythmic drug.  Because of renal insufficiency cannot take sotalol or Tikosyn.  Will add Cardizem on top of carvedilol to prevent these episodes.  Currently appears to be stable and may need workup for coronary artery disease as an outpatient.  Cheyenne Frank A

## 2022-09-10 NOTE — ED Notes (Signed)
Assumed care from Arissa,RN. Pt resting comfortably in bed at this time. Pt denies any current needs or questions. Call light with in reach.   

## 2022-09-14 ENCOUNTER — Other Ambulatory Visit: Payer: Self-pay

## 2022-09-14 ENCOUNTER — Encounter: Payer: Self-pay | Admitting: Emergency Medicine

## 2022-09-14 ENCOUNTER — Emergency Department
Admission: EM | Admit: 2022-09-14 | Discharge: 2022-09-15 | Disposition: A | Discharge status: Home / Self Care | Payer: Medicare Other | Attending: Emergency Medicine | Admitting: Emergency Medicine

## 2022-09-14 ENCOUNTER — Emergency Department: Payer: Medicare Other

## 2022-09-14 DIAGNOSIS — I4891 Unspecified atrial fibrillation: Secondary | ICD-10-CM | POA: Diagnosis not present

## 2022-09-14 DIAGNOSIS — E119 Type 2 diabetes mellitus without complications: Secondary | ICD-10-CM | POA: Insufficient documentation

## 2022-09-14 DIAGNOSIS — R079 Chest pain, unspecified: Secondary | ICD-10-CM | POA: Diagnosis present

## 2022-09-14 DIAGNOSIS — I10 Essential (primary) hypertension: Secondary | ICD-10-CM | POA: Diagnosis not present

## 2022-09-14 DIAGNOSIS — Z1152 Encounter for screening for COVID-19: Secondary | ICD-10-CM | POA: Insufficient documentation

## 2022-09-14 DIAGNOSIS — I509 Heart failure, unspecified: Secondary | ICD-10-CM | POA: Insufficient documentation

## 2022-09-14 DIAGNOSIS — R0602 Shortness of breath: Secondary | ICD-10-CM | POA: Insufficient documentation

## 2022-09-14 LAB — CBC WITH DIFFERENTIAL/PLATELET
Abs Immature Granulocytes: 0.04 10*3/uL (ref 0.00–0.07)
Basophils Absolute: 0 10*3/uL (ref 0.0–0.1)
Basophils Relative: 0 %
Eosinophils Absolute: 0.1 10*3/uL (ref 0.0–0.5)
Eosinophils Relative: 2 %
HCT: 38.1 % (ref 36.0–46.0)
Hemoglobin: 12.2 g/dL (ref 12.0–15.0)
Immature Granulocytes: 1 %
Lymphocytes Relative: 15 %
Lymphs Abs: 1.1 10*3/uL (ref 0.7–4.0)
MCH: 29.8 pg (ref 26.0–34.0)
MCHC: 32 g/dL (ref 30.0–36.0)
MCV: 92.9 fL (ref 80.0–100.0)
Monocytes Absolute: 0.5 10*3/uL (ref 0.1–1.0)
Monocytes Relative: 7 %
Neutro Abs: 5.4 10*3/uL (ref 1.7–7.7)
Neutrophils Relative %: 75 %
Platelets: 151 10*3/uL (ref 150–400)
RBC: 4.1 MIL/uL (ref 3.87–5.11)
RDW: 14.2 % (ref 11.5–15.5)
WBC: 7.1 10*3/uL (ref 4.0–10.5)
nRBC: 0 % (ref 0.0–0.2)

## 2022-09-14 LAB — BASIC METABOLIC PANEL
Anion gap: 9 (ref 5–15)
BUN: 49 mg/dL — ABNORMAL HIGH (ref 8–23)
CO2: 28 mmol/L (ref 22–32)
Calcium: 8.8 mg/dL — ABNORMAL LOW (ref 8.9–10.3)
Chloride: 104 mmol/L (ref 98–111)
Creatinine, Ser: 1.85 mg/dL — ABNORMAL HIGH (ref 0.44–1.00)
GFR, Estimated: 27 mL/min — ABNORMAL LOW (ref >60)
Glucose, Bld: 265 mg/dL — ABNORMAL HIGH (ref 70–99)
Potassium: 4.1 mmol/L (ref 3.5–5.1)
Sodium: 141 mmol/L (ref 135–145)

## 2022-09-14 LAB — RESP PANEL BY RT-PCR (RSV, FLU A&B, COVID)  RVPGX2
Influenza A by PCR: NEGATIVE
Influenza B by PCR: NEGATIVE
Resp Syncytial Virus by PCR: NEGATIVE
SARS Coronavirus 2 by RT PCR: NEGATIVE

## 2022-09-14 LAB — TROPONIN I (HIGH SENSITIVITY)
Troponin I (High Sensitivity): 15 ng/L (ref <18)
Troponin I (High Sensitivity): 15 ng/L (ref <18)

## 2022-09-14 LAB — BRAIN NATRIURETIC PEPTIDE: B Natriuretic Peptide: 813.7 pg/mL — ABNORMAL HIGH (ref 0.0–100.0)

## 2022-09-14 MED ORDER — DILTIAZEM HCL 25 MG/5ML IV SOLN
10.0000 mg | Freq: Once | INTRAVENOUS | Status: AC
  Administered 2022-09-14: 10 mg via INTRAVENOUS
  Filled 2022-09-14: qty 5

## 2022-09-14 MED ORDER — SODIUM CHLORIDE 0.9 % IV BOLUS
1000.0000 mL | Freq: Once | INTRAVENOUS | Status: AC
  Administered 2022-09-14: 1000 mL via INTRAVENOUS

## 2022-09-14 NOTE — ED Provider Notes (Signed)
[facility] Provider Note    Event Date/Time   First MD Initiated Contact with Patient 09/14/22 2017     (approximate)  History   Chief Complaint: Chest Pain  HPI  Cheyenne Frank is a 81 y.o. female with a past medical history of anemia, arthritis, diabetes, hypertension, presents to the emergency department for palpitations and chest tightness.  Patient states it feels like her atrial fibrillation is acting up she has been increasingly short of breath with chest tightness.  Patient found to be tachycardic around 140 bpm.  Physical Exam   Triage Vital Signs: ED Triage Vitals  Enc Vitals Group     BP 09/14/22 1446 (!) 115/57     Pulse Rate 09/14/22 1446 (!) 136     Resp 09/14/22 1446 (!) 22     Temp 09/14/22 1446 98 F (36.7 C)     Temp Source 09/14/22 1446 Oral     SpO2 09/14/22 1446 98 %     Weight 09/14/22 1445 235 lb (106.6 kg)     Height 09/14/22 1445 5\' 3"  (1.6 m)     Head Circumference --      Peak Flow --      Pain Score 09/14/22 1445 8     Pain Loc --      Pain Edu? --      Excl. in GC? --     Most recent vital signs: Vitals:   09/14/22 1446 09/14/22 2000  BP: (!) 115/57 (!) 120/92  Pulse: (!) 136 (!) 133  Resp: (!) 22 (!) 28  Temp: 98 F (36.7 C)   SpO2: 98% 94%    General: Awake, no distress.  CV:  Good peripheral perfusion.  Largely regular rhythm rate around 120 to 140 bpm. Resp:  Normal effort.  Equal breath sounds bilaterally.  Abd:  No distention.  Soft, nontender.  No rebound or guarding.   ED Results / Procedures / Treatments   EKG  EKG viewed and interpreted by myself shows tachycardia 135 bpm with a narrow QRS, left axis deviation, largely normal intervals, nonspecific ST changes.  RADIOLOGY  I reviewed and interpreted the chest x-ray images.  No consolidation seen on my evaluation. Radiology read the chest x-ray as negative.   MEDICATIONS ORDERED IN ED: Medications  diltiazem (CARDIZEM) injection 10 mg  (has no administration in time range)  sodium chloride 0.9 % bolus 1,000 mL (has no administration in time range)     IMPRESSION / MDM / ASSESSMENT AND PLAN / ED COURSE  I reviewed the triage vital signs and the nursing notes.  Patient's presentation is most consistent with acute presentation with potential threat to life or bodily function.  Patient presents emergency department for chest tightness and palpitations.  Patient found to be tachycardic with what appears likely to be atrial fibrillation which the patient has a history of around 130 to 140 bpm.  Patient denies any pain currently.  No shortness of breath.  Patient satting in the mid to upper 90s on room air.  Patient's labs have resulted showing negative troponin x 2, chemistry shows no concerning findings, chronic renal insufficiency.  Patient's BNP is elevated 813 but no signs of pulmonary edema or hypoxia.  Patient CBC is largely within normal limits patient's COVID/flu/RSV is negative.  Patient given 10 mg of IV diltiazem.  On recheck patient appears to be in a more regular rate around 75 bpm.  We will repeat an EKG.  Patient denies  any symptoms currently, overall appears well.  We will reassess after the second EKG.  Patient is hopeful that she can go home.  Given the patient's elevated BNP I did recommend she increase her torsemide from 20 mg daily to 1.5 tablets (30 mg daily) for the next 7 days.  Patient agreeable to plan.  Repeat EKG viewed and interpreted by myself shows a sinus rhythm at 73 bpm with a narrow QRS, normal axis, normal intervals besides slight PR prolongation, nonspecific but no concerning ST changes.  We will discharge patient home increase her torsemide and have her follow-up with the heart failure clinic as well as her PCP.  Patient agreeable to plan of care.  FINAL CLINICAL IMPRESSION(S) / ED DIAGNOSES   Atrial fibrillation with rapid ventricular response CHF   Note:  This document was prepared using  Dragon voice recognition software and may include unintentional dictation errors.   Minna Antis, MD 09/14/22 2159

## 2022-09-14 NOTE — ED Triage Notes (Signed)
Patient c/o chest pain since yesterday morning. States pain is intermittent and mid chest. Radiates into left arm. Patient also has cough x 5 weeks.

## 2022-09-14 NOTE — ED Notes (Signed)
MD Scotty Court made aware that patient heart rate is 133.

## 2022-09-14 NOTE — ED Provider Triage Note (Signed)
Emergency Medicine Provider Triage Evaluation Note  Cheyenne Frank , a 81 y.o. female  was evaluated in triage.  Pt complains of chest pain x2 days, intermittent. Lasts 5 minutes. Feels dizzy when stands up. Reprots the pain feels like crushing pain. She does not have it now. No SOB. No history of PE/DVT. Not worse with deep inspiration.Also has cough.   Review of Systems  Positive: CP, dizzy Negative: SOB, n/v/d  Physical Exam  BP (!) 115/57   Pulse (!) 136   Temp 98 F (36.7 C) (Oral)   Resp (!) 22   Ht 5\' 3"  (1.6 m)   Wt 106.6 kg   SpO2 98%   BMI 41.63 kg/m  Gen:   Awake, no distress   Resp:  Normal effort  MSK:   Moves extremities without difficulty  Other:    Medical Decision Making  Medically screening exam initiated at 2:48 PM.  Appropriate orders placed.  was informed that the remainder of the evaluation will be completed by another provider, this initial triage assessment does not replace that evaluation, and the importance of remaining in the ED until their evaluation is complete.     [name], PA-C 09/14/22 1451

## 2022-09-14 NOTE — ED Notes (Signed)
[facility] to transport pt to home 

## 2022-09-14 NOTE — Discharge Instructions (Addendum)
Your workup in the emergency department has shown reassuring results.  Your heart rate was fast however after treatment you are now back into a normal sinus rhythm.  I do believe you likely have a little bit of extra fluid, please increase your torsemide (fluid pill) from 20 mg daily to 30 mg (1.5 tablets) per day for the next 7 days.  Please follow-up with your primary care doctor within the next couple days for recheck/reevaluation.  Return to the emergency department for any further chest pain tightness any shortness of breath or any other symptom personally concerning to yourself.

## 2022-09-14 NOTE — ED Notes (Signed)
Called patients daughter [name] and continue to get a voicemail. Will keep trying.

## 2022-09-25 ENCOUNTER — Encounter: Payer: Commercial Managed Care - PPO | Admitting: Family

## 2022-10-21 ENCOUNTER — Emergency Department: Payer: Medicare Other

## 2022-10-21 ENCOUNTER — Other Ambulatory Visit: Payer: Self-pay

## 2022-10-21 ENCOUNTER — Emergency Department
Admission: EM | Admit: 2022-10-21 | Discharge: 2022-10-21 | Disposition: A | Discharge status: Home / Self Care | Payer: Medicare Other | Attending: Emergency Medicine | Admitting: Emergency Medicine

## 2022-10-21 DIAGNOSIS — E1165 Type 2 diabetes mellitus with hyperglycemia: Secondary | ICD-10-CM | POA: Diagnosis not present

## 2022-10-21 DIAGNOSIS — N189 Chronic kidney disease, unspecified: Secondary | ICD-10-CM | POA: Diagnosis not present

## 2022-10-21 DIAGNOSIS — Z8673 Personal history of transient ischemic attack (TIA), and cerebral infarction without residual deficits: Secondary | ICD-10-CM | POA: Insufficient documentation

## 2022-10-21 DIAGNOSIS — J449 Chronic obstructive pulmonary disease, unspecified: Secondary | ICD-10-CM | POA: Insufficient documentation

## 2022-10-21 DIAGNOSIS — I4891 Unspecified atrial fibrillation: Secondary | ICD-10-CM

## 2022-10-21 DIAGNOSIS — U071 COVID-19: Secondary | ICD-10-CM | POA: Diagnosis not present

## 2022-10-21 DIAGNOSIS — R079 Chest pain, unspecified: Secondary | ICD-10-CM | POA: Diagnosis present

## 2022-10-21 DIAGNOSIS — I130 Hypertensive heart and chronic kidney disease with heart failure and stage 1 through stage 4 chronic kidney disease, or unspecified chronic kidney disease: Secondary | ICD-10-CM | POA: Insufficient documentation

## 2022-10-21 DIAGNOSIS — E1122 Type 2 diabetes mellitus with diabetic chronic kidney disease: Secondary | ICD-10-CM | POA: Insufficient documentation

## 2022-10-21 DIAGNOSIS — I509 Heart failure, unspecified: Secondary | ICD-10-CM | POA: Diagnosis not present

## 2022-10-21 DIAGNOSIS — R739 Hyperglycemia, unspecified: Secondary | ICD-10-CM

## 2022-10-21 LAB — COMPREHENSIVE METABOLIC PANEL
ALT: 17 U/L (ref 0–44)
AST: 22 U/L (ref 15–41)
Albumin: 3.1 g/dL — ABNORMAL LOW (ref 3.5–5.0)
Alkaline Phosphatase: 100 U/L (ref 38–126)
Anion gap: 12 (ref 5–15)
BUN: 36 mg/dL — ABNORMAL HIGH (ref 8–23)
CO2: 28 mmol/L (ref 22–32)
Calcium: 8.9 mg/dL (ref 8.9–10.3)
Chloride: 97 mmol/L — ABNORMAL LOW (ref 98–111)
Creatinine, Ser: 1.49 mg/dL — ABNORMAL HIGH (ref 0.44–1.00)
GFR, Estimated: 35 mL/min — ABNORMAL LOW (ref >60)
Glucose, Bld: 324 mg/dL — ABNORMAL HIGH (ref 70–99)
Potassium: 3.9 mmol/L (ref 3.5–5.1)
Sodium: 137 mmol/L (ref 135–145)
Total Bilirubin: 0.7 mg/dL (ref 0.3–1.2)
Total Protein: 6.6 g/dL (ref 6.5–8.1)

## 2022-10-21 LAB — CBC WITH DIFFERENTIAL/PLATELET
Abs Immature Granulocytes: 0.04 10*3/uL (ref 0.00–0.07)
Basophils Absolute: 0 10*3/uL (ref 0.0–0.1)
Basophils Relative: 0 %
Eosinophils Absolute: 0.1 10*3/uL (ref 0.0–0.5)
Eosinophils Relative: 2 %
HCT: 36.4 % (ref 36.0–46.0)
Hemoglobin: 11.8 g/dL — ABNORMAL LOW (ref 12.0–15.0)
Immature Granulocytes: 1 %
Lymphocytes Relative: 12 %
Lymphs Abs: 1.1 10*3/uL (ref 0.7–4.0)
MCH: 29.3 pg (ref 26.0–34.0)
MCHC: 32.4 g/dL (ref 30.0–36.0)
MCV: 90.3 fL (ref 80.0–100.0)
Monocytes Absolute: 0.7 10*3/uL (ref 0.1–1.0)
Monocytes Relative: 8 %
Neutro Abs: 6.9 10*3/uL (ref 1.7–7.7)
Neutrophils Relative %: 77 %
Platelets: 139 10*3/uL — ABNORMAL LOW (ref 150–400)
RBC: 4.03 MIL/uL (ref 3.87–5.11)
RDW: 13.9 % (ref 11.5–15.5)
WBC: 8.8 10*3/uL (ref 4.0–10.5)
nRBC: 0 % (ref 0.0–0.2)

## 2022-10-21 LAB — RESP PANEL BY RT-PCR (RSV, FLU A&B, COVID)  RVPGX2
Influenza A by PCR: NEGATIVE
Influenza B by PCR: NEGATIVE
Resp Syncytial Virus by PCR: NEGATIVE
SARS Coronavirus 2 by RT PCR: POSITIVE — AB

## 2022-10-21 LAB — BRAIN NATRIURETIC PEPTIDE: B Natriuretic Peptide: 498.5 pg/mL — ABNORMAL HIGH (ref 0.0–100.0)

## 2022-10-21 LAB — CBG MONITORING, ED: Glucose-Capillary: 209 mg/dL — ABNORMAL HIGH (ref 70–99)

## 2022-10-21 LAB — TROPONIN I (HIGH SENSITIVITY)
Troponin I (High Sensitivity): 18 ng/L — ABNORMAL HIGH (ref <18)
Troponin I (High Sensitivity): 18 ng/L — ABNORMAL HIGH (ref <18)

## 2022-10-21 LAB — LIPASE, BLOOD: Lipase: 30 U/L (ref 11–51)

## 2022-10-21 MED ORDER — DILTIAZEM HCL 25 MG/5ML IV SOLN
10.0000 mg | Freq: Once | INTRAVENOUS | Status: AC
  Administered 2022-10-21: 10 mg via INTRAVENOUS
  Filled 2022-10-21: qty 5

## 2022-10-21 MED ORDER — DILTIAZEM HCL 60 MG PO TABS
60.0000 mg | ORAL_TABLET | Freq: Once | ORAL | Status: AC
  Administered 2022-10-21: 60 mg via ORAL
  Filled 2022-10-21: qty 1

## 2022-10-21 MED ORDER — ACETAMINOPHEN 500 MG PO TABS
1000.0000 mg | ORAL_TABLET | Freq: Once | ORAL | Status: AC
  Administered 2022-10-21: 1000 mg via ORAL
  Filled 2022-10-21: qty 2

## 2022-10-21 MED ORDER — INSULIN ASPART 100 UNIT/ML IJ SOLN
8.0000 [IU] | Freq: Once | INTRAMUSCULAR | Status: AC
  Administered 2022-10-21: 8 [IU] via INTRAVENOUS
  Filled 2022-10-21: qty 1

## 2022-10-21 MED ORDER — CARVEDILOL 25 MG PO TABS
25.0000 mg | ORAL_TABLET | Freq: Two times a day (BID) | ORAL | Status: DC
  Administered 2022-10-21: 25 mg via ORAL
  Filled 2022-10-21: qty 1

## 2022-10-21 NOTE — Discharge Instructions (Addendum)
Take Tylenol 650 mg every 6 hours for headache or pains or fevers.  I do not want you to take Paxlovid for COVID because it has a drug interaction with Eliquis which is important for your atrial fibrillation and preventing blood clots.  Continue to take all medications as prescribed and please do not forget to take your atrial fibrillation medications as this can cause your heart to go too fast.  Thank you for choosing [place] for your health care today!  Please see your primary doctor this week for a follow up appointment.   Sometimes, in the early stages of certain disease courses it is difficult to detect in the emergency department evaluation -- so, it is important that you continue to monitor your symptoms and call your doctor right away or return to the emergency department if you develop any new or worsening symptoms.  Please go to the following website to schedule new (and existing) patient appointments:   [email or web address]  If you do not have a primary doctor try calling the following clinics to establish care:  If you have insurance:  [facility] [phone] [place] [address]   [facility]  [phone] [place] [place] [address]   If you do not have insurance:  [facility]  [phone] [address] [place] [address]   The following is another list of primary care offices in the area who are accepting new patients at this time.  Please reach out to one of them directly and let them know you would like to schedule an appointment to follow up on an Emergency Department visit, and/or to establish a new primary care provider (PCP).  There are likely other primary care clinics in the are who are accepting new patients, but this is an excellent place to start:  [place] physician: Dr Lavon Paganini [address] [place], [place]  [address] [phone]  [facility] Lead Physician: Dr Steele Sizer [address], [place], [place] [address] [phone]  [place] Physician: Dr Park Liter [address] [place], [place] [address] [phone]  [facility] Lead Physician: Dr Dewaine Oats [place], [place], [place] [address] [phone]  [facility] Physician: Dr Halina Maidens [address] [place] [place], [place] [address] [phone]   It was my pleasure to care for you today.   Hoover Brunette Jacelyn Grip, MD

## 2022-10-21 NOTE — ED Triage Notes (Addendum)
Pt arrived via EMS from home with complaint of chest pressure. Pt states it started around 0900. Pt took ASA this morning with no relief. Pt states she is short of breath but has CHF so that and a productive cough are not abnormal for her. Pt denies N/V/D. Pt CBG was 427 by EMS. Pt alert and oriented x4.

## 2022-10-21 NOTE — ED Provider Notes (Signed)
[facility] Provider Note    Event Date/Time   First MD Initiated Contact with Patient 10/21/22 1717     (approximate)   History   Chest Pain   HPI  Cheyenne Frank is a 82 y.o. female   Past medical history of CHF, COPD, insulin-dependent diabetes, hypertension, atrial fibrillation on Eliquis, CVA, CKD, who presents to the emergency department with chief complaint of elevated blood pressure and elevated blood sugar from her at home checks this morning.  She has also had polyuria and polydipsia.  She has been compliant with all her medications including her insulin.  Initially EMS reported chest pressure but patient denies, stating that she has increased blood pressure that she was concerned about.  She has baseline shortness of breath and cough that are unchanged and she has no fever or chills and denies GI or GU complaints.   When she first presented she was tachycardic and initial EKG with AF RVR in the 130s spontaneously resovled to NSR w normal BP.    External Medical Documents Reviewed: Discharge summary from hospitalization from December 2023 where she presented with chief complaint of chest pain and shortness of breath found to be in A-fib with RVR requiring Cardizem drip.      Physical Exam   Triage Vital Signs: ED Triage Vitals  Enc Vitals Group     BP 10/21/22 1721 (!) 108/59     Pulse Rate 10/21/22 1721 (!) 131     Resp 10/21/22 1721 17     Temp 10/21/22 1721 98.2 F (36.8 C)     Temp Source 10/21/22 1721 Oral     SpO2 10/21/22 1721 96 %     Weight --      Height --      Head Circumference --      Peak Flow --      Pain Score 10/21/22 1722 8     Pain Loc --      Pain Edu? --      Excl. in [place]? --     Most recent vital signs: Vitals:   10/21/22 2030 10/21/22 2036  BP: 127/61   Pulse: 81 70  Resp: 19 15  Temp:    SpO2:  93%    General: Awake, no distress.  CV:  Good peripheral perfusion.  Resp:  Normal effort.   Abd:  No distention.  Other:  Awake alert oriented nontoxic-appearing neck is supple with full range of motion lungs clear without obvious rales focalities or wheezing, abdomen soft and nontender skin appears warm well-perfused euvolemic appearing abdomen soft and nontender.  Initially tachycardic 130s irregular rate spontaneously converted to regular rate in the 70s and blood pressure 108/59 afebrile.   ED Results / Procedures / Treatments   Labs (all labs ordered are listed, but only abnormal results are displayed) Labs Reviewed  RESP PANEL BY RT-PCR (RSV, FLU A&B, COVID)  RVPGX2 - Abnormal; Notable for the following components:      Result Value   SARS Coronavirus 2 by RT PCR POSITIVE (*)    All other components within normal limits  COMPREHENSIVE METABOLIC PANEL - Abnormal; Notable for the following components:   Chloride 97 (*)    Glucose, Bld 324 (*)    BUN 36 (*)    Creatinine, Ser 1.49 (*)    Albumin 3.1 (*)    GFR, Estimated 35 (*)    All other components within normal limits  CBC WITH DIFFERENTIAL/PLATELET - Abnormal;  Notable for the following components:   Hemoglobin 11.8 (*)    Platelets 139 (*)    All other components within normal limits  BRAIN NATRIURETIC PEPTIDE - Abnormal; Notable for the following components:   B Natriuretic Peptide 498.5 (*)    All other components within normal limits  CBG MONITORING, ED - Abnormal; Notable for the following components:   Glucose-Capillary 209 (*)    All other components within normal limits  TROPONIN I (HIGH SENSITIVITY) - Abnormal; Notable for the following components:   Troponin I (High Sensitivity) 18 (*)    All other components within normal limits  TROPONIN I (HIGH SENSITIVITY) - Abnormal; Notable for the following components:   Troponin I (High Sensitivity) 18 (*)    All other components within normal limits  LIPASE, BLOOD  TROPONIN I (HIGH SENSITIVITY)     I ordered and reviewed the above labs they are notable  for troponin is 18, BNP is 498, she is COVID-positive and her glucose is 324 which is near her baseline in the 2-3 100s with a normal anion gap  EKG  ED ECG REPORT I, Lucillie Garfinkel, the attending physician, personally viewed and interpreted this ECG.   Date: 10/21/2022  EKG Time: 1720  Rate: 132  Rhythm: AF 130s  Axis: nl  Intervals:none  ST&T Change: No acute ischemic changes **Repeat EKG obtained 10 minutes later at 1729 shows normal sinus rhythm with a rate in the 70s no ischemic changes**    RADIOLOGY I independently reviewed and interpreted chest x-ray see no obvious focal opacities or pneumothorax   PROCEDURES:  Critical Care performed: Yes, see critical care procedure note(s)  .Critical Care  Performed by: Lucillie Garfinkel, MD Authorized by: Lucillie Garfinkel, MD   Critical care provider statement:    Critical care time (minutes):  30   Critical care was time spent personally by me on the following activities:  Development of treatment plan with patient or surrogate, discussions with consultants, evaluation of patient's response to treatment, examination of patient, ordering and review of laboratory studies, ordering and review of radiographic studies, ordering and performing treatments and interventions, pulse oximetry, re-evaluation of patient's condition and review of old charts    MEDICATIONS ORDERED IN ED: Medications  carvedilol (COREG) tablet 25 mg (25 mg Oral Given 10/21/22 1944)  acetaminophen (TYLENOL) tablet 1,000 mg (1,000 mg Oral Given 10/21/22 1944)  diltiazem (CARDIZEM) injection 10 mg (10 mg Intravenous Given 10/21/22 1946)  insulin aspart (novoLOG) injection 8 Units (8 Units Intravenous Given 10/21/22 1943)  diltiazem (CARDIZEM) tablet 60 mg (60 mg Oral Given 10/21/22 2020)     IMPRESSION / MDM / [place] / ED COURSE  I reviewed the triage vital signs and the nursing notes.                                Patient's presentation is most consistent with  acute presentation with potential threat to life or bodily function.  Differential diagnosis includes, but is not limited to, hyperglycemia/DKA, ACS, respiratory infection, COPD or CHF exacerbation, atrial fibrillation with RVR   The patient is on the cardiac monitor to evaluate for evidence of arrhythmia and/or significant heart rate changes.  MDM: Patient without any symptoms today despite her positive COVID test she denies acute shortness of breath, chest pain, cough, fever.  Again her chief complaint today was concerns about her blood sugar and blood pressure-her blood sugar is  elevated but she is not in DKA and it seems to be at her baseline so we will give her some insulin to lower the blood sugar.  Her BNP is elevated but he does not appear fluid overloaded and her chest x-ray shows no pulmonary edema.  She has a history of atrial fibrillation and seems to be going in and out of atrial fibrillation with RVR, initial EKG appeared to be atrial fibrillation with RVR with a rate in the 130s versus sinus tachycardia but thought more likely to be atrial fibrillation given her spontaneous and immediate conversion to normal sinus rhythm in the 70s.  Later in her emergency department stay she jumped up to 130s again, and so I ordered her 10 of IV diltiazem which appeared to work well for rate control during her last emergency department visit.  She thinks she may have forgotten to take her morning dose of Cardizem.  Her oxygen saturation levels ranged from 90% to 95% and seem to desaturate when she talks, she does have a history of COPD and she does not wear home oxygen.  She is adamant about going home stating that she feels well.  She understands however that her heart rate is elevated and must be controlled given this high rate is not safe to go home with.  We will attempt rate control and continue to monitor in the emergency department, but if rate is controlled and she is largely asymptomatic  with her COVID diagnosis and her desire is to discharge home, we can accommodate that.   Rate controlled now in the 70s to 90s.  Blood pressure has been stable normotensive.  She remains asymptomatic.  She wants to go home.  Paxlovid contraindicated because she is on Eliquis, discussed with pharmacy.  I considered hospitalization for admission or observation for COVID and atrial fibrillation with RVR but since patient is asymptomatic and rate is now controlled, and she wants to go home understands risks and need for follow-up and return precautions, she will be discharged at this time.        FINAL CLINICAL IMPRESSION(S) / ED DIAGNOSES   Final diagnoses:  COVID  Hyperglycemia  Atrial fibrillation with RVR ([place])     Rx / DC Orders   ED Discharge Orders     None        Note:  This document was prepared using Dragon voice recognition software and may include unintentional dictation errors.    Lucillie Garfinkel, MD 10/21/22 2053

## 2022-10-21 NOTE — ED Notes (Signed)
Pt tachycardic in 130's. EKG obtained for MD Jacelyn Grip.

## 2022-10-21 NOTE — ED Notes (Signed)
Call placed to daughter, ETA [phone]

## 2022-10-21 NOTE — ED Notes (Signed)
Pt in room crying and calling out for RN. RN at bedside and explained to patient we are waiting to draw another lab for her heart enzyme. Pt states she wants to just go home. MD Jacelyn Grip notified.

## 2022-11-15 ENCOUNTER — Other Ambulatory Visit: Payer: Self-pay

## 2022-11-15 ENCOUNTER — Inpatient Hospital Stay
Admission: EM | Admit: 2022-11-15 | Discharge: 2022-11-18 | DRG: 309 | Disposition: A | Discharge status: Home Health | Payer: Medicare Other | Attending: Internal Medicine | Admitting: Internal Medicine

## 2022-11-15 ENCOUNTER — Emergency Department: Payer: Medicare Other

## 2022-11-15 ENCOUNTER — Encounter: Payer: Self-pay | Admitting: Emergency Medicine

## 2022-11-15 DIAGNOSIS — Z79899 Other long term (current) drug therapy: Secondary | ICD-10-CM

## 2022-11-15 DIAGNOSIS — Z825 Family history of asthma and other chronic lower respiratory diseases: Secondary | ICD-10-CM

## 2022-11-15 DIAGNOSIS — E66813 Obesity, class 3: Secondary | ICD-10-CM | POA: Diagnosis present

## 2022-11-15 DIAGNOSIS — F419 Anxiety disorder, unspecified: Secondary | ICD-10-CM | POA: Diagnosis not present

## 2022-11-15 DIAGNOSIS — Z6841 Body Mass Index (BMI) 40.0 and over, adult: Secondary | ICD-10-CM

## 2022-11-15 DIAGNOSIS — Z882 Allergy status to sulfonamides status: Secondary | ICD-10-CM

## 2022-11-15 DIAGNOSIS — Z993 Dependence on wheelchair: Secondary | ICD-10-CM

## 2022-11-15 DIAGNOSIS — D631 Anemia in chronic kidney disease: Secondary | ICD-10-CM | POA: Diagnosis present

## 2022-11-15 DIAGNOSIS — I480 Paroxysmal atrial fibrillation: Principal | ICD-10-CM | POA: Diagnosis present

## 2022-11-15 DIAGNOSIS — M797 Fibromyalgia: Secondary | ICD-10-CM | POA: Diagnosis present

## 2022-11-15 DIAGNOSIS — Z885 Allergy status to narcotic agent status: Secondary | ICD-10-CM

## 2022-11-15 DIAGNOSIS — I130 Hypertensive heart and chronic kidney disease with heart failure and stage 1 through stage 4 chronic kidney disease, or unspecified chronic kidney disease: Secondary | ICD-10-CM | POA: Diagnosis present

## 2022-11-15 DIAGNOSIS — E1122 Type 2 diabetes mellitus with diabetic chronic kidney disease: Secondary | ICD-10-CM | POA: Diagnosis present

## 2022-11-15 DIAGNOSIS — J4489 Other specified chronic obstructive pulmonary disease: Secondary | ICD-10-CM | POA: Diagnosis present

## 2022-11-15 DIAGNOSIS — Z90710 Acquired absence of both cervix and uterus: Secondary | ICD-10-CM

## 2022-11-15 DIAGNOSIS — I10 Essential (primary) hypertension: Secondary | ICD-10-CM | POA: Diagnosis present

## 2022-11-15 DIAGNOSIS — G2581 Restless legs syndrome: Secondary | ICD-10-CM | POA: Diagnosis present

## 2022-11-15 DIAGNOSIS — G40909 Epilepsy, unspecified, not intractable, without status epilepticus: Secondary | ICD-10-CM

## 2022-11-15 DIAGNOSIS — E1165 Type 2 diabetes mellitus with hyperglycemia: Secondary | ICD-10-CM

## 2022-11-15 DIAGNOSIS — Z96652 Presence of left artificial knee joint: Secondary | ICD-10-CM | POA: Diagnosis present

## 2022-11-15 DIAGNOSIS — Z1152 Encounter for screening for COVID-19: Secondary | ICD-10-CM

## 2022-11-15 DIAGNOSIS — K219 Gastro-esophageal reflux disease without esophagitis: Secondary | ICD-10-CM | POA: Diagnosis present

## 2022-11-15 DIAGNOSIS — I4891 Unspecified atrial fibrillation: Secondary | ICD-10-CM | POA: Diagnosis present

## 2022-11-15 DIAGNOSIS — Z9049 Acquired absence of other specified parts of digestive tract: Secondary | ICD-10-CM

## 2022-11-15 DIAGNOSIS — I509 Heart failure, unspecified: Secondary | ICD-10-CM

## 2022-11-15 DIAGNOSIS — Z886 Allergy status to analgesic agent status: Secondary | ICD-10-CM

## 2022-11-15 DIAGNOSIS — F03918 Unspecified dementia, unspecified severity, with other behavioral disturbance: Secondary | ICD-10-CM | POA: Diagnosis present

## 2022-11-15 DIAGNOSIS — Z8673 Personal history of transient ischemic attack (TIA), and cerebral infarction without residual deficits: Secondary | ICD-10-CM

## 2022-11-15 DIAGNOSIS — Z7901 Long term (current) use of anticoagulants: Secondary | ICD-10-CM

## 2022-11-15 DIAGNOSIS — Z7982 Long term (current) use of aspirin: Secondary | ICD-10-CM

## 2022-11-15 DIAGNOSIS — F0394 Unspecified dementia, unspecified severity, with anxiety: Secondary | ICD-10-CM | POA: Diagnosis present

## 2022-11-15 DIAGNOSIS — Z794 Long term (current) use of insulin: Secondary | ICD-10-CM

## 2022-11-15 DIAGNOSIS — Z66 Do not resuscitate: Secondary | ICD-10-CM | POA: Diagnosis present

## 2022-11-15 DIAGNOSIS — N1832 Chronic kidney disease, stage 3b: Secondary | ICD-10-CM | POA: Diagnosis present

## 2022-11-15 DIAGNOSIS — I5032 Chronic diastolic (congestive) heart failure: Secondary | ICD-10-CM | POA: Diagnosis present

## 2022-11-15 DIAGNOSIS — Z888 Allergy status to other drugs, medicaments and biological substances status: Secondary | ICD-10-CM

## 2022-11-15 NOTE — ED Provider Notes (Signed)
[facility] Provider Note    Event Date/Time   First MD Initiated Contact with Patient 11/15/22 2347     (approximate)   History   Shortness of Breath and Altered Mental Status   HPI  Cheyenne Frank is a 82 y.o. female who presents to the ED for evaluation of Shortness of Breath and Altered Mental Status   Review 2/25 medical DC summary.  History of A-fib on Eliquis, depression and anxiety, HTN and GERD.  DM, CKD.  Does have a documented history of dementia.  Was admitted for A-fib with RVR.  Patient presents from home by EMS for evaluation of acute shortness of breath and altered mentation.  Reportedly she was trying to speak to dead family members and seemed dyspneic with this at home and so daughter called 51.  Patient is somewhat disoriented and cannot provide much relevant history.  She denies any dyspnea now reports feeling fine, requesting water.  No complaints.  Physical Exam   Triage Vital Signs: ED Triage Vitals  Enc Vitals Group     BP      Pulse      Resp      Temp      Temp src      SpO2      Weight      Height      Head Circumference      Peak Flow      Pain Score      Pain Loc      Pain Edu?      Excl. in [place]?     Most recent vital signs: Vitals:   11/16/22 0230 11/16/22 0245  BP:  123/88  Pulse: (!) 131 (!) 132  Resp: (!) 21 (!) 26  Temp:  97.7 F (36.5 C)  SpO2: 95% 94%    General: Awake, no distress.  Pleasantly disoriented.  Follows commands in all 4 without apparent deficit.  CV:  Good peripheral perfusion.  Tachycardic and irregular Resp:    Mild tachypnea to the mid 20s without distress or wheezing Abd:  No distention.  Soft and benign MSK:  No deformity noted.  Pitting edema to bilateral lower extremities symmetrically Neuro:  No focal deficits appreciated. Other:     ED Results / Procedures / Treatments   Labs (all labs ordered are listed, but only abnormal results are displayed) Labs Reviewed   COMPREHENSIVE METABOLIC PANEL - Abnormal; Notable for the following components:      Result Value   Chloride 95 (*)    Glucose, Bld 353 (*)    BUN 41 (*)    Creatinine, Ser 1.45 (*)    Albumin 3.2 (*)    GFR, Estimated 36 (*)    All other components within normal limits  CBC WITH DIFFERENTIAL/PLATELET - Abnormal; Notable for the following components:   Platelets 142 (*)    All other components within normal limits  BRAIN NATRIURETIC PEPTIDE - Abnormal; Notable for the following components:   B Natriuretic Peptide 174.7 (*)    All other components within normal limits  RESP PANEL BY RT-PCR (RSV, FLU A&B, COVID)  RVPGX2  MAGNESIUM  URINALYSIS, ROUTINE W REFLEX MICROSCOPIC  TROPONIN I (HIGH SENSITIVITY)  TROPONIN I (HIGH SENSITIVITY)    EKG A-fib with RVR with a rate of 131 bpm.  Normal axis.  QTc 501.  Nonspecific ST changes without clear signs of acute ischemia or STEMI  RADIOLOGY CT head interpreted by me without evidence  of acute intracranial pathology CXR interpreted by me without evidence of acute cardiopulmonary pathology.  Official radiology report(s): CT HEAD WO CONTRAST (5MM)  Result Date: 11/16/2022 CLINICAL DATA:  Altered breathing and altered mental status. EXAM: CT HEAD WITHOUT CONTRAST TECHNIQUE: Contiguous axial images were obtained from the base of the skull through the vertex without intravenous contrast. RADIATION DOSE REDUCTION: This exam was performed according to the departmental dose-optimization program which includes automated exposure control, adjustment of the mA and/or kV according to patient size and/or use of iterative reconstruction technique. COMPARISON:  July 17, 2022 FINDINGS: Brain: There is mild cerebral atrophy with widening of the extra-axial spaces and ventricular dilatation. There are areas of decreased attenuation within the white matter tracts of the supratentorial brain, consistent with microvascular disease changes. Chronic bilateral  cerebellar and left occipital lobe infarcts are seen. Chronic left thalamic and right periventricular lacunar infarcts are also noted. Vascular: No hyperdense vessel or unexpected calcification. Skull: Normal. Negative for fracture or focal lesion. Sinuses/Orbits: No acute finding. Other: None. IMPRESSION: 1. No acute intracranial abnormality. 2. 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 3. Chronic left thalamic and right periventricular lacunar infarcts. 4. Generalized cerebral atrophy and microvascular disease changes of the supratentorial brain. Electronically Signed   By: Virgina Norfolk M.D.   On: 11/16/2022 00:11   DG Chest Portable 1 View  Result Date: 11/16/2022 CLINICAL DATA:  Difficulty breathing. EXAM: PORTABLE CHEST 1 VIEW COMPARISON:  October 21, 2022 FINDINGS: The cardiac silhouette is mildly enlarged and unchanged in size. There is no evidence of an acute infiltrate, pleural effusion or pneumothorax. Chronic seventh and eighth right rib fractures are seen. Postoperative changes are noted within the lower thoracic and upper lumbar spine. IMPRESSION: Stable cardiomegaly without evidence of acute or active cardiopulmonary disease. Electronically Signed   By: Virgina Norfolk M.D.   On: 11/16/2022 00:08    PROCEDURES and INTERVENTIONS:  .1-3 Lead EKG Interpretation  Performed by: Vladimir Crofts, MD Authorized by: Vladimir Crofts, MD     Interpretation: abnormal     ECG rate:  134   ECG rate assessment: tachycardic     Rhythm: atrial fibrillation     Ectopy: none     Conduction: normal   .Critical Care  Performed by: Vladimir Crofts, MD Authorized by: Vladimir Crofts, MD   Critical care provider statement:    Critical care time (minutes):  30   Critical care time was exclusive of:  Separately billable procedures and treating other patients   Critical care was necessary to treat or prevent imminent or life-threatening deterioration of the following conditions:  Cardiac  failure and circulatory failure   Critical care was time spent personally by me on the following activities:  Development of treatment plan with patient or surrogate, discussions with consultants, evaluation of patient's response to treatment, examination of patient, ordering and review of laboratory studies, ordering and review of radiographic studies, ordering and performing treatments and interventions, pulse oximetry, re-evaluation of patient's condition and review of old charts   Medications  diltiazem (CARDIZEM) 125 mg in dextrose 5% 125 mL (1 mg/mL) infusion (10 mg/hr Intravenous Rate/Dose Change 11/16/22 0237)  midazolam (VERSED) injection 2 mg (2 mg Intravenous Given 11/16/22 0054)  furosemide (LASIX) injection 60 mg (60 mg Intravenous Given 11/16/22 0154)  diltiazem (CARDIZEM) injection 10 mg (10 mg Intravenous Given 11/16/22 0150)     IMPRESSION / MDM / [place] / ED COURSE  I reviewed the triage vital signs and the  nursing notes.  Differential diagnosis includes, but is not limited to, ACS, PTX, PNA, muscle strain/spasm, PE, dissection, stroke, sundowning  {Patient presents with symptoms of an acute illness or injury that is potentially life-threatening.  82 year old female presents with signs of A-fib with RVR and a CHF exacerbation, alongside anxiety with possible sundowning, requiring medical admission on a diltiazem drip for diuresis.  She is tachycardic and a rapid A-fib but hemodynamically stable.  No hypoxia.  Looks volume overloaded on exam.  CXR without pleural effusions or infiltrate.  Blood work with elevated BNP.  Renal dysfunction around baseline.  Normal CBC and troponin.   She presents calm and pleasant, but slightly disoriented consistent with dementia.  Later, as below, she does have an episode of anxiety resolved with a small dose of IV diazepam.  Suspicion for sundowning.   Due to continued rapid rates of A-fib, requires diltiazem bolus and drip.  Will start  diuresis and consult with medicine for admission.  Clinical Course as of 11/16/22 0249  Fri Nov 16, 2022  0053 Reassessed.  One of her daughters, [name], is at the bedside.  [name], the other daughter, staying at home because she is not in great health and has MS.  Patient is now quite anxious, tearful, tremulous and does not know why.  Will provide a small dose of calming agents as we continue workup [DS]  0127 Reassessed discussed plan of care [DS]    Clinical Course User Index [DS] Vladimir Crofts, MD     FINAL CLINICAL IMPRESSION(S) / ED DIAGNOSES   Final diagnoses:  Atrial fibrillation with RVR ([place])  Acute on chronic congestive heart failure, unspecified heart failure type [facility])  Anxiety     Rx / DC Orders   ED Discharge Orders     None        Note:  This document was prepared using Dragon voice recognition software and may include unintentional dictation errors.   Vladimir Crofts, MD 11/16/22 [phone]

## 2022-11-15 NOTE — ED Triage Notes (Signed)
  Patient BIB EMS for SOB and AMS that started earlier this evening.  Family states she was calling out for deceased family members and they were concerned her breathing had gotten worse.  Hx of CHF.  Patient alert and states she has no pain at the moment.  Patient states her breathing feels better at this time.  SPO2 99% on RA.

## 2022-11-16 ENCOUNTER — Other Ambulatory Visit: Payer: Commercial Managed Care - PPO

## 2022-11-16 ENCOUNTER — Encounter: Payer: Self-pay | Admitting: Internal Medicine

## 2022-11-16 ENCOUNTER — Emergency Department: Payer: Medicare Other

## 2022-11-16 DIAGNOSIS — M797 Fibromyalgia: Secondary | ICD-10-CM | POA: Diagnosis present

## 2022-11-16 DIAGNOSIS — Z8673 Personal history of transient ischemic attack (TIA), and cerebral infarction without residual deficits: Secondary | ICD-10-CM | POA: Diagnosis not present

## 2022-11-16 DIAGNOSIS — Z825 Family history of asthma and other chronic lower respiratory diseases: Secondary | ICD-10-CM | POA: Diagnosis not present

## 2022-11-16 DIAGNOSIS — Z794 Long term (current) use of insulin: Secondary | ICD-10-CM | POA: Diagnosis not present

## 2022-11-16 DIAGNOSIS — E1122 Type 2 diabetes mellitus with diabetic chronic kidney disease: Secondary | ICD-10-CM | POA: Diagnosis present

## 2022-11-16 DIAGNOSIS — N1832 Chronic kidney disease, stage 3b: Secondary | ICD-10-CM | POA: Diagnosis present

## 2022-11-16 DIAGNOSIS — I4891 Unspecified atrial fibrillation: Secondary | ICD-10-CM | POA: Diagnosis present

## 2022-11-16 DIAGNOSIS — I130 Hypertensive heart and chronic kidney disease with heart failure and stage 1 through stage 4 chronic kidney disease, or unspecified chronic kidney disease: Secondary | ICD-10-CM | POA: Diagnosis present

## 2022-11-16 DIAGNOSIS — F0394 Unspecified dementia, unspecified severity, with anxiety: Secondary | ICD-10-CM | POA: Diagnosis present

## 2022-11-16 DIAGNOSIS — Z96652 Presence of left artificial knee joint: Secondary | ICD-10-CM | POA: Diagnosis present

## 2022-11-16 DIAGNOSIS — Z7901 Long term (current) use of anticoagulants: Secondary | ICD-10-CM | POA: Diagnosis not present

## 2022-11-16 DIAGNOSIS — J4489 Other specified chronic obstructive pulmonary disease: Secondary | ICD-10-CM | POA: Diagnosis present

## 2022-11-16 DIAGNOSIS — I480 Paroxysmal atrial fibrillation: Secondary | ICD-10-CM | POA: Diagnosis present

## 2022-11-16 DIAGNOSIS — G40909 Epilepsy, unspecified, not intractable, without status epilepticus: Secondary | ICD-10-CM | POA: Diagnosis present

## 2022-11-16 DIAGNOSIS — Z6841 Body Mass Index (BMI) 40.0 and over, adult: Secondary | ICD-10-CM | POA: Diagnosis not present

## 2022-11-16 DIAGNOSIS — D631 Anemia in chronic kidney disease: Secondary | ICD-10-CM | POA: Diagnosis present

## 2022-11-16 DIAGNOSIS — Z66 Do not resuscitate: Secondary | ICD-10-CM | POA: Diagnosis present

## 2022-11-16 DIAGNOSIS — G2581 Restless legs syndrome: Secondary | ICD-10-CM | POA: Diagnosis present

## 2022-11-16 DIAGNOSIS — I5032 Chronic diastolic (congestive) heart failure: Secondary | ICD-10-CM | POA: Diagnosis present

## 2022-11-16 DIAGNOSIS — Z993 Dependence on wheelchair: Secondary | ICD-10-CM | POA: Diagnosis not present

## 2022-11-16 DIAGNOSIS — Z1152 Encounter for screening for COVID-19: Secondary | ICD-10-CM | POA: Diagnosis not present

## 2022-11-16 DIAGNOSIS — E1165 Type 2 diabetes mellitus with hyperglycemia: Secondary | ICD-10-CM | POA: Diagnosis not present

## 2022-11-16 DIAGNOSIS — F03918 Unspecified dementia, unspecified severity, with other behavioral disturbance: Secondary | ICD-10-CM | POA: Diagnosis present

## 2022-11-16 DIAGNOSIS — Z9049 Acquired absence of other specified parts of digestive tract: Secondary | ICD-10-CM | POA: Diagnosis not present

## 2022-11-16 DIAGNOSIS — F419 Anxiety disorder, unspecified: Secondary | ICD-10-CM | POA: Diagnosis present

## 2022-11-16 LAB — URINALYSIS, ROUTINE W REFLEX MICROSCOPIC
Bacteria, UA: NONE SEEN
Bilirubin Urine: NEGATIVE
Glucose, UA: 150 mg/dL — AB
Hgb urine dipstick: NEGATIVE
Ketones, ur: NEGATIVE mg/dL
Leukocytes,Ua: NEGATIVE
Nitrite: NEGATIVE
Protein, ur: 100 mg/dL — AB
Specific Gravity, Urine: 1.009 (ref 1.005–1.030)
pH: 7 (ref 5.0–8.0)

## 2022-11-16 LAB — COMPREHENSIVE METABOLIC PANEL
ALT: 18 U/L (ref 0–44)
AST: 27 U/L (ref 15–41)
Albumin: 3.2 g/dL — ABNORMAL LOW (ref 3.5–5.0)
Alkaline Phosphatase: 100 U/L (ref 38–126)
Anion gap: 10 (ref 5–15)
BUN: 41 mg/dL — ABNORMAL HIGH (ref 8–23)
CO2: 30 mmol/L (ref 22–32)
Calcium: 9.4 mg/dL (ref 8.9–10.3)
Chloride: 95 mmol/L — ABNORMAL LOW (ref 98–111)
Creatinine, Ser: 1.45 mg/dL — ABNORMAL HIGH (ref 0.44–1.00)
GFR, Estimated: 36 mL/min — ABNORMAL LOW (ref >60)
Glucose, Bld: 353 mg/dL — ABNORMAL HIGH (ref 70–99)
Potassium: 4 mmol/L (ref 3.5–5.1)
Sodium: 135 mmol/L (ref 135–145)
Total Bilirubin: 0.7 mg/dL (ref 0.3–1.2)
Total Protein: 6.8 g/dL (ref 6.5–8.1)

## 2022-11-16 LAB — TROPONIN I (HIGH SENSITIVITY)
Troponin I (High Sensitivity): 11 ng/L (ref <18)
Troponin I (High Sensitivity): 18 ng/L — ABNORMAL HIGH (ref <18)

## 2022-11-16 LAB — RESP PANEL BY RT-PCR (RSV, FLU A&B, COVID)  RVPGX2
Influenza A by PCR: NEGATIVE
Influenza B by PCR: NEGATIVE
Resp Syncytial Virus by PCR: NEGATIVE
SARS Coronavirus 2 by RT PCR: NEGATIVE

## 2022-11-16 LAB — CBC WITH DIFFERENTIAL/PLATELET
Abs Immature Granulocytes: 0.03 10*3/uL (ref 0.00–0.07)
Basophils Absolute: 0 10*3/uL (ref 0.0–0.1)
Basophils Relative: 0 %
Eosinophils Absolute: 0.2 10*3/uL (ref 0.0–0.5)
Eosinophils Relative: 2 %
HCT: 38.4 % (ref 36.0–46.0)
Hemoglobin: 12.4 g/dL (ref 12.0–15.0)
Immature Granulocytes: 0 %
Lymphocytes Relative: 18 %
Lymphs Abs: 1.3 10*3/uL (ref 0.7–4.0)
MCH: 28.8 pg (ref 26.0–34.0)
MCHC: 32.3 g/dL (ref 30.0–36.0)
MCV: 89.3 fL (ref 80.0–100.0)
Monocytes Absolute: 0.7 10*3/uL (ref 0.1–1.0)
Monocytes Relative: 9 %
Neutro Abs: 5.4 10*3/uL (ref 1.7–7.7)
Neutrophils Relative %: 71 %
Platelets: 142 10*3/uL — ABNORMAL LOW (ref 150–400)
RBC: 4.3 MIL/uL (ref 3.87–5.11)
RDW: 13.8 % (ref 11.5–15.5)
WBC: 7.6 10*3/uL (ref 4.0–10.5)
nRBC: 0 % (ref 0.0–0.2)

## 2022-11-16 LAB — GLUCOSE, CAPILLARY
Glucose-Capillary: 347 mg/dL — ABNORMAL HIGH (ref 70–99)
Glucose-Capillary: 211 mg/dL — ABNORMAL HIGH (ref 70–99)
Glucose-Capillary: 133 mg/dL — ABNORMAL HIGH (ref 70–99)

## 2022-11-16 LAB — BRAIN NATRIURETIC PEPTIDE: B Natriuretic Peptide: 174.7 pg/mL — ABNORMAL HIGH (ref 0.0–100.0)

## 2022-11-16 LAB — MAGNESIUM: Magnesium: 1.9 mg/dL (ref 1.7–2.4)

## 2022-11-16 LAB — CBG MONITORING, ED: Glucose-Capillary: 371 mg/dL — ABNORMAL HIGH (ref 70–99)

## 2022-11-16 MED ORDER — INSULIN GLARGINE-YFGN 100 UNIT/ML ~~LOC~~ SOLN
35.0000 [IU] | Freq: Every day | SUBCUTANEOUS | Status: DC
  Administered 2022-11-16 – 2022-11-18 (×3): 35 [IU] via SUBCUTANEOUS
  Filled 2022-11-16 (×3): qty 0.35

## 2022-11-16 MED ORDER — MIDAZOLAM HCL 2 MG/2ML IJ SOLN
2.0000 mg | Freq: Once | INTRAMUSCULAR | Status: AC
  Administered 2022-11-16: 2 mg via INTRAVENOUS
  Filled 2022-11-16: qty 2

## 2022-11-16 MED ORDER — FUROSEMIDE 10 MG/ML IJ SOLN
60.0000 mg | Freq: Once | INTRAMUSCULAR | Status: AC
  Administered 2022-11-16: 60 mg via INTRAVENOUS
  Filled 2022-11-16: qty 8

## 2022-11-16 MED ORDER — INSULIN ASPART 100 UNIT/ML IJ SOLN
0.0000 [IU] | Freq: Every day | INTRAMUSCULAR | Status: DC
  Administered 2022-11-17: 2 [IU] via SUBCUTANEOUS

## 2022-11-16 MED ORDER — CARVEDILOL 25 MG PO TABS
25.0000 mg | ORAL_TABLET | Freq: Two times a day (BID) | ORAL | Status: DC
  Administered 2022-11-16 – 2022-11-18 (×4): 25 mg via ORAL
  Filled 2022-11-16 (×5): qty 1

## 2022-11-16 MED ORDER — LOSARTAN POTASSIUM 25 MG PO TABS
25.0000 mg | ORAL_TABLET | Freq: Every day | ORAL | Status: DC
  Administered 2022-11-16 – 2022-11-18 (×3): 25 mg via ORAL
  Filled 2022-11-16 (×3): qty 1

## 2022-11-16 MED ORDER — DILTIAZEM HCL 25 MG/5ML IV SOLN
10.0000 mg | Freq: Once | INTRAVENOUS | Status: AC
  Administered 2022-11-16: 10 mg via INTRAVENOUS
  Filled 2022-11-16: qty 5

## 2022-11-16 MED ORDER — ACETAMINOPHEN 325 MG PO TABS: 650.0000 mg | ORAL_TABLET | ORAL | Status: DC | PRN

## 2022-11-16 MED ORDER — INSULIN ASPART 100 UNIT/ML IJ SOLN
0.0000 [IU] | Freq: Three times a day (TID) | INTRAMUSCULAR | Status: DC
  Administered 2022-11-16: 5 [IU] via SUBCUTANEOUS
  Administered 2022-11-16: 15 [IU] via SUBCUTANEOUS
  Administered 2022-11-17: 5 [IU] via SUBCUTANEOUS
  Administered 2022-11-17 – 2022-11-18 (×2): 3 [IU] via SUBCUTANEOUS
  Administered 2022-11-18: 5 [IU] via SUBCUTANEOUS
  Filled 2022-11-16 (×7): qty 1

## 2022-11-16 MED ORDER — LORAZEPAM 0.5 MG PO TABS
0.5000 mg | ORAL_TABLET | Freq: Four times a day (QID) | ORAL | Status: AC | PRN
  Administered 2022-11-16: 0.5 mg via ORAL
  Filled 2022-11-16: qty 1

## 2022-11-16 MED ORDER — ONDANSETRON HCL 4 MG/2ML IJ SOLN
4.0000 mg | Freq: Four times a day (QID) | INTRAMUSCULAR | Status: DC | PRN
  Filled 2022-11-16: qty 2

## 2022-11-16 MED ORDER — APIXABAN 5 MG PO TABS
5.0000 mg | ORAL_TABLET | Freq: Two times a day (BID) | ORAL | Status: DC
  Administered 2022-11-16 – 2022-11-18 (×4): 5 mg via ORAL
  Filled 2022-11-16 (×5): qty 1

## 2022-11-16 MED ORDER — DILTIAZEM HCL 30 MG PO TABS
30.0000 mg | ORAL_TABLET | Freq: Four times a day (QID) | ORAL | Status: DC
  Administered 2022-11-16 – 2022-11-17 (×5): 30 mg via ORAL
  Filled 2022-11-16 (×5): qty 1

## 2022-11-16 MED ORDER — HYDRALAZINE HCL 25 MG PO TABS: 25.0000 mg | ORAL_TABLET | Freq: Three times a day (TID) | ORAL | Status: DC | PRN

## 2022-11-16 MED ORDER — ASPIRIN 81 MG PO TBEC
81.0000 mg | DELAYED_RELEASE_TABLET | Freq: Every day | ORAL | Status: DC
  Administered 2022-11-16 – 2022-11-18 (×3): 81 mg via ORAL
  Filled 2022-11-16 (×3): qty 1

## 2022-11-16 MED ORDER — PAROXETINE HCL 20 MG PO TABS
40.0000 mg | ORAL_TABLET | Freq: Every day | ORAL | Status: DC
  Administered 2022-11-16: 40 mg via ORAL
  Filled 2022-11-16 (×2): qty 2

## 2022-11-16 MED ORDER — TRAZODONE HCL 50 MG PO TABS
75.0000 mg | ORAL_TABLET | Freq: Every day | ORAL | Status: DC
  Administered 2022-11-16: 75 mg via ORAL
  Filled 2022-11-16 (×2): qty 2

## 2022-11-16 MED ORDER — ROSUVASTATIN CALCIUM 5 MG PO TABS
2.5000 mg | ORAL_TABLET | Freq: Every day | ORAL | Status: DC
  Administered 2022-11-16: 2.5 mg via ORAL
  Filled 2022-11-16 (×2): qty 1

## 2022-11-16 MED ORDER — PANTOPRAZOLE SODIUM 40 MG PO TBEC
40.0000 mg | DELAYED_RELEASE_TABLET | Freq: Every day | ORAL | Status: DC
  Administered 2022-11-16 – 2022-11-18 (×3): 40 mg via ORAL
  Filled 2022-11-16 (×3): qty 1

## 2022-11-16 MED ORDER — ALBUTEROL SULFATE (2.5 MG/3ML) 0.083% IN NEBU: 2.5000 mg | INHALATION_SOLUTION | Freq: Four times a day (QID) | RESPIRATORY_TRACT | Status: DC | PRN

## 2022-11-16 MED ORDER — DILTIAZEM HCL-DEXTROSE 125-5 MG/125ML-% IV SOLN (PREMIX)
5.0000 mg/h | INTRAVENOUS | Status: DC
  Administered 2022-11-16: 5 mg/h via INTRAVENOUS
  Filled 2022-11-16: qty 125

## 2022-11-16 MED ORDER — TORSEMIDE 20 MG PO TABS
20.0000 mg | ORAL_TABLET | Freq: Every day | ORAL | Status: DC
  Administered 2022-11-16 – 2022-11-18 (×3): 20 mg via ORAL
  Filled 2022-11-16 (×3): qty 1

## 2022-11-16 MED ORDER — MORPHINE SULFATE (PF) 2 MG/ML IV SOLN
2.0000 mg | INTRAVENOUS | Status: DC | PRN
  Administered 2022-11-16 – 2022-11-17 (×4): 2 mg via INTRAVENOUS
  Filled 2022-11-16 (×4): qty 1

## 2022-11-16 NOTE — ED Notes (Signed)
Noted pts hr to be in the 80's and SR. 12-lead completed and given to MD Tamala Julian. MD Damita Dunnings also notified. Cardizem drip still infusing at '10mg'$ /hr until further orders received. Will continue to monitor. CB in reach.

## 2022-11-16 NOTE — ED Notes (Signed)
2L [place] placed rt saturation dropping to the low 80's. After placed O2 noted to be 97.

## 2022-11-16 NOTE — Progress Notes (Signed)
   Attempted to see patient, while in the ED.  She was asleep and no family members present.  Pricilla Riffle RN [facility]

## 2022-11-16 NOTE — H&P (Addendum)
History and Physical    Patient: Cheyenne Frank [id number] DOB: 09-Dec-1940 DOA: 11/15/2022 DOS: the patient was seen and examined on 11/16/2022 PCP: [facility]  Patient coming from: Home  Chief Complaint:  Chief Complaint  Patient presents with   Shortness of Breath   Altered Mental Status    HPI: Cheyenne Frank is a 82 y.o. female with medical history significant for Paroxysmal A-fib on Eliquis, prior stroke, HTN, insulin-dependent type 2 diabetes, sleep apnea, dementia, CKD stage IIIb, COPD, diastolic CHF brought in by EMS with shortness of breath and altered mental status described as anxiety, tearfulness and calling out for deceased family members.  She denies chest pain, cough, fever or chills. ED course and data review: Patient in rapid A-fib at 132, respirations 26 with O2 sat 94% on room air, BP 123/88.  Labs notable for troponin of 18 and BNP 174.  Glucose 353.creatinine at baseline at 1.45.  Respiratory viral panel negative.  Chest x-ray was nonacute Patient was treated with a diltiazem bolus and started on a diltiazem infusion she was also given a dose of IV Lasix as well as a dose of Versed due to anxiety noted in the ED. Hospitalist consulted for admission Patient converted to sinus rhythm at 80 soon after admission and was started on oral Cardizem     Past Medical History:  Diagnosis Date   AKI (acute kidney injury) ([place])    Anemia of chronic disease    Anxiety    Arthritis    Asthma    no problems since 1999 (after moving into a new home)   Chronic airway obstruction ([place])    Complication of anesthesia    " feel like I'm dying after I wake up"   Diabetes mellitus without complication ([place])    Diverticulosis    Dysrhythmia    unknown type    Fibromyalgia    Flat back syndrome    GERD (gastroesophageal reflux disease)    Hx of transfusion 2011   Hypertension    Itching    Macular degeneration    Nocturia    Postoperative anemia due to  acute blood loss 04/09/2013   Restless leg syndrome    Sleep apnea    Dx 1990's unable to wear c-pap -   Stroke ([place]) 2010   verbal aphasia x 30 min - resolved - no problem since then   Past Surgical History:  Procedure Laterality Date   ABDOMINAL HYSTERECTOMY     rt so   BACK SURGERY  2010 / 2011   BREAST [place]  2015   cataracts removed     [place] / [place]     with T10-S1 fusion   TONSILLECTOMY     TOTAL KNEE ARTHROPLASTY Left 04/06/2013   Procedure: LEFT TOTAL KNEE ARTHROPLASTY;  Surgeon: Gearlean Alf, MD;  Location: [facility];  Service: Orthopedics;  Laterality: Left;   Social History:  reports that she has never smoked. She has never used smokeless tobacco. She reports that she does not drink alcohol and does not use drugs.  Allergies  Allergen Reactions   Codeine Palpitations    Other Reaction: RACES HEART   Dilaudid [Hydromorphone] Other (See Comments) and Rash    Other Reaction: lightheaded, altered ms, nervous, Paranoia paranoid Other Reaction: lightheaded,  altered ms, nervous, Paranoia   Demerol [Meperidine] Other (See Comments) and Rash    agitated Agitation   Pravastatin Other (See Comments)    Muscle aches   Amiodarone Other (See Comments)    Per family pt had twitching while taking and cardiologist took pt off    Keppra [Levetiracetam]    Lipitor [Atorvastatin] Other (See Comments) and Itching    unknown   Nifedipine Other (See Comments)    unknown   Statins Rash    Makes her crazy   Sulfa Antibiotics Rash   Vancomycin Rash    deafness    Family History  Problem Relation Age of Onset   COPD Sister        had lung transplant   COPD Brother     Prior to Admission medications   Medication Sig Start Date End Date Taking? Authorizing Provider  acetaminophen (TYLENOL) 500 MG tablet Take  500-1,000 mg by mouth every 6 (six) hours as needed for mild pain or fever.     [provider]  albuterol (VENTOLIN HFA) 108 (90 Base) MCG/ACT inhaler Inhale 2 puffs into the lungs every 6 (six) hours as needed for wheezing or shortness of breath. 05/19/20   [provider]  apixaban (ELIQUIS) 5 MG TABS tablet Take 1 tablet (5 mg total) by mouth 2 (two) times daily. 05/22/21   Shelly Coss, MD  aspirin EC 81 MG tablet Take 1 tablet (81 mg total) by mouth daily. MAY RESUME ON 12/31/20 Patient taking differently: Take 81 mg by mouth daily. 12/31/20   Bonnielee Haff, MD  carvedilol (COREG) 25 MG tablet Take 25 mg by mouth 2 (two) times daily.     [provider]  diltiazem (CARDIZEM CD) 120 MG 24 hr capsule Take 1 capsule (120 mg total) by mouth daily. 09/10/22 09/10/23  Shawna Clamp, MD  gabapentin (NEURONTIN) 300 MG capsule Take 1 capsule (300 mg total) by mouth 2 (two) times daily. 02/07/22   Nita Sells, MD  hydrALAZINE (APRESOLINE) 25 MG tablet Take 1 tablet (25 mg total) by mouth 3 (three) times daily as needed (For systolic above 0000000). 10/30/21   Lorella Nimrod, MD  insulin glargine (LANTUS) 100 UNIT/ML injection Inject 0.35 mLs (35 Units total) into the skin at bedtime. Patient taking differently: Inject 55 Units into the skin at bedtime. 02/02/22   Sharen Hones, MD  insulin lispro (HUMALOG) 100 UNIT/ML injection Inject 0.08 mLs (8 Units total) into the skin 3 (three) times daily with meals. Patient taking differently: Inject 10 Units into the skin 3 (three) times daily with meals. 02/02/22   Sharen Hones, MD  losartan (COZAAR) 25 MG tablet Take 25 mg by mouth daily. 07/03/22   [provider]  Multiple Vitamin (MULTIVITAMIN WITH MINERALS) TABS tablet Take 1 tablet by mouth daily.    [provider]  omeprazole (PRILOSEC) 40 MG capsule Take 40 mg by mouth daily.     [provider]  oxybutynin (DITROPAN-XL) 10 MG 24 hr tablet Take 10 mg  by mouth daily. 10/09/21   [provider]  PARoxetine (PAXIL) 40 MG tablet Take 1 tablet (40 mg total) by mouth at bedtime. 02/07/22   Nita Sells, MD  rosuvastatin (CRESTOR) 5 MG tablet Take 2.5 mg by mouth at bedtime.    [provider]  sitaGLIPtin (JANUVIA) 50 MG tablet Take 50 mg by mouth daily.    [provider]  torsemide (DEMADEX) 20 MG tablet Take 1 tablet (20 mg total) by  mouth daily. Please start from 11/01/2021 10/30/21   Lorella Nimrod, MD  traZODone (DESYREL) 50 MG tablet Take 1 tablet (50 mg total) by mouth at bedtime. Patient taking differently: Take 75 mg by mouth at bedtime. 02/07/22   Nita Sells, MD    Physical Exam: Vitals:   11/16/22 0230 11/16/22 0245 11/16/22 0300 11/16/22 0317  BP:  123/88 (!) 139/99   Pulse: (!) 131 (!) 132 (!) 137 81  Resp: (!) 21 (!) 26 (!) 26 (!) 29  Temp:  97.7 F (36.5 C)    TempSrc:  Oral    SpO2: 95% 94% 99% 99%  Weight:      Height:       Physical Exam Vitals and nursing note reviewed.  Constitutional:      General: She is not in acute distress. HENT:     Head: Normocephalic and atraumatic.  Cardiovascular:     Rate and Rhythm: Tachycardia present. Rhythm irregular.     Heart sounds: Normal heart sounds.  Pulmonary:     Effort: Tachypnea present.     Breath sounds: Normal breath sounds.  Abdominal:     Palpations: Abdomen is soft.     Tenderness: There is no abdominal tenderness.  Neurological:     General: No focal deficit present.     Mental Status: She is disoriented.     Labs on Admission: I have personally reviewed following labs and imaging studies  CBC: Recent Labs  Lab 11/16/22 0001  WBC 7.6  NEUTROABS 5.4  HGB 12.4  HCT 38.4  MCV 89.3  PLT [id number]*   Basic Metabolic Panel: Recent Labs  Lab 11/16/22 0001  NA 135  K 4.0  CL 95*  CO2 30  GLUCOSE 353*  BUN 41*  CREATININE 1.45*  CALCIUM 9.4  MG 1.9   GFR: Estimated Creatinine Clearance: 35.6 mL/min (A)  (by C-G formula based on SCr of 1.45 mg/dL (H)). Liver Function Tests: Recent Labs  Lab 11/16/22 0001  AST 27  ALT 18  ALKPHOS 100  BILITOT 0.7  PROT 6.8  ALBUMIN 3.2*   No results for input(s): "LIPASE", "AMYLASE" in the last 168 hours. No results for input(s): "AMMONIA" in the last 168 hours. Coagulation Profile: No results for input(s): "INR", "PROTIME" in the last 168 hours. Cardiac Enzymes: No results for input(s): "CKTOTAL", "CKMB", "CKMBINDEX", "TROPONINI" in the last 168 hours. BNP (last 3 results) No results for input(s): "PROBNP" in the last 8760 hours. HbA1C: No results for input(s): "HGBA1C" in the last 72 hours. CBG: No results for input(s): "GLUCAP" in the last 168 hours. Lipid Profile: No results for input(s): "CHOL", "HDL", "LDLCALC", "TRIG", "CHOLHDL", "LDLDIRECT" in the last 72 hours. Thyroid Function Tests: No results for input(s): "TSH", "T4TOTAL", "FREET4", "T3FREE", "THYROIDAB" in the last 72 hours. Anemia Panel: No results for input(s): "VITAMINB12", "FOLATE", "FERRITIN", "TIBC", "IRON", "RETICCTPCT" in the last 72 hours. Urine analysis:    Component Value Date/Time   COLORURINE STRAW (A) 11/16/2022 0235   APPEARANCEUR CLEAR (A) 11/16/2022 0235   APPEARANCEUR Clear 06/29/2014 1138   LABSPEC 1.009 11/16/2022 0235   LABSPEC 1.019 06/29/2014 1138   PHURINE 7.0 11/16/2022 0235   GLUCOSEU 150 (A) 11/16/2022 0235   GLUCOSEU Negative 06/29/2014 1138   HGBUR NEGATIVE 11/16/2022 0235   BILIRUBINUR NEGATIVE 11/16/2022 0235   BILIRUBINUR Negative 06/29/2014 1138   KETONESUR NEGATIVE 11/16/2022 0235   PROTEINUR 100 (A) 11/16/2022 0235   UROBILINOGEN 0.2 12/14/2013 1737   NITRITE NEGATIVE 11/16/2022 0235  LEUKOCYTESUR NEGATIVE 11/16/2022 0235   LEUKOCYTESUR Negative 06/29/2014 1138    Radiological Exams on Admission: CT HEAD WO CONTRAST (5MM)  Result Date: 11/16/2022 CLINICAL DATA:  Altered breathing and altered mental status. EXAM: CT HEAD WITHOUT  CONTRAST TECHNIQUE: Contiguous axial images were obtained from the base of the skull through the vertex without intravenous contrast. RADIATION DOSE REDUCTION: This exam was performed according to the departmental dose-optimization program which includes automated exposure control, adjustment of the mA and/or kV according to patient size and/or use of iterative reconstruction technique. COMPARISON:  July 17, 2022 FINDINGS: Brain: There is mild cerebral atrophy with widening of the extra-axial spaces and ventricular dilatation. There are areas of decreased attenuation within the white matter tracts of the supratentorial brain, consistent with microvascular disease changes. Chronic bilateral cerebellar and left occipital lobe infarcts are seen. Chronic left thalamic and right periventricular lacunar infarcts are also noted. Vascular: No hyperdense vessel or unexpected calcification. Skull: Normal. Negative for fracture or focal lesion. Sinuses/Orbits: No acute finding. Other: None. IMPRESSION: 1. No acute intracranial abnormality. 2. 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 3. Chronic left thalamic and right periventricular lacunar infarcts. 4. Generalized cerebral atrophy and microvascular disease changes of the supratentorial brain. Electronically Signed   By: Virgina Norfolk M.D.   On: 11/16/2022 00:11   DG Chest Portable 1 View  Result Date: 11/16/2022 CLINICAL DATA:  Difficulty breathing. EXAM: PORTABLE CHEST 1 VIEW COMPARISON:  October 21, 2022 FINDINGS: The cardiac silhouette is mildly enlarged and unchanged in size. There is no evidence of an acute infiltrate, pleural effusion or pneumothorax. Chronic seventh and eighth right rib fractures are seen. Postoperative changes are noted within the lower thoracic and upper lumbar spine. IMPRESSION: Stable cardiomegaly without evidence of acute or active cardiopulmonary disease. Electronically Signed   By: Virgina Norfolk M.D.   On:  11/16/2022 00:08     Data Reviewed: Relevant notes from primary care and specialist visits, past discharge summaries as available in EHR, including Care Everywhere. Prior diagnostic testing as pertinent to current admission diagnoses Updated medications and problem lists for reconciliation ED course, including vitals, labs, imaging, treatment and response to treatment Triage notes, nursing and pharmacy notes and ED provider's notes Notable results as noted in HPI   Assessment and Plan:   Atrial fibrillation with RVR ([place]) Patient converted to sinus rhythm while on diltiazem infusion Diltiazem infusion transitioned to oral Cardizem Continue carvedilol  Continue apixaban as secondary prophylaxis for an acute stroke   Chronic diastolic CHF (congestive heart failure) ([place]) Patient has chronic diastolic dysfunction CHF with last known LVEF of 60 to 65% from a 2D echocardiogram which was done 02/23 Not acutely exacerbated Continue carvedilol Hold losartan and torsemide due to worsening renal function   Uncontrolled type 2 diabetes mellitus with hyperglycemia with long-term current use of insulin Continue home basal insulin Sliding scale coverage   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 Maintain consistent carbohydrate diet Sliding scale with NovoLog insulin   Dementia with behavioral disturbance (HCC) Patient got Versed in the ED Currently stable Continue Paxil  History of CVA (cerebrovascular accident) Patient has a history of a prior CVA and at baseline has mild cognitive impairment and is wheelchair-bound but able to ambulate short distances per daughter. Continue aspirin and statin  Obesity, Class III, BMI 40-49.9 (morbid obesity) (HCC) BMI 40 Complicates overall prognosis and care   History  of seizure disorder [facility]) Patient no longer takes Keppra    DVT prophylaxis:  Apixaban  Consults: none  Advance Care Planning: DNR per ACP documents  Family Communication: none  Disposition Plan: Back to previous home environment  Severity of Illness: The appropriate patient status for this patient is OBSERVATION. Observation status is judged to be reasonable and necessary in order to provide the required intensity of service to ensure the patient's safety. The patient's presenting symptoms, physical exam findings, and initial radiographic and laboratory data in the context of their medical condition is felt to place them at decreased risk for further clinical deterioration. Furthermore, it is anticipated that the patient will be medically stable for discharge from the hospital within 2 midnights of admission.   Author: Athena Masse, MD 11/16/2022 3:20 AM  For on call review [email or web address].

## 2022-11-16 NOTE — Discharge Instructions (Signed)

## 2022-11-16 NOTE — Inpatient Diabetes Management (Addendum)
Inpatient Diabetes Program Recommendations  AACE/ADA: New Consensus Statement on Inpatient Glycemic Control (2015)  Target Ranges:  Prepandial:   less than 140 mg/dL      Peak postprandial:   less than 180 mg/dL (1-2 hours)      Critically ill patients:  140 - 180 mg/dL   Lab Results  Component Value Date   GLUCAP 209 (H) 10/21/2022   HGBA1C 9.1 (H) 07/16/2022    Review of Glycemic Control  Diabetes history: DM2 Outpatient Diabetes medications:  Lantus 64 units QD Humalog 10 units TID Januvia 50 mg QD Current orders for Inpatient glycemic control:  Semglee 35 units TID  Inpatient Diabetes Program Recommendations:    Please consider:  Novolog 0-15 units TID and 0-5 units QHS with cbg's ac/hs.  Will continue to follow while inpatient.  Thank you, Reche Dixon, MSN, [place] Diabetes Coordinator Inpatient Diabetes Program [phone] (team pager from 8a-5p)

## 2022-11-16 NOTE — Progress Notes (Signed)
Interval events noted.  Patient has no complaints.  She feels better.  Her speech is a little slurred.  I spoke to [place], daughter, who said that she has allergies that patient speech was slurred yesterday.  She was in A-fib RVR but she has now converted to normal sinus rhythm after treatment with IV Cardizem infusion.  She has been started on oral Cardizem.  She is also on carvedilol.  Continue Eliquis for stroke prophylaxis.  Telemetry shows normal sinus rhythm.  CT head did not show any acute stroke but there is evidence of previous stroke.  Patient normally gets around in a wheelchair and lives with a daughter who is unwell.  I spoke to [name], another daughter, who said that it may be difficult to take care of patient at home.  PT and OT has been ordered for further evaluation.

## 2022-11-17 DIAGNOSIS — Z8673 Personal history of transient ischemic attack (TIA), and cerebral infarction without residual deficits: Secondary | ICD-10-CM | POA: Diagnosis not present

## 2022-11-17 DIAGNOSIS — I4891 Unspecified atrial fibrillation: Secondary | ICD-10-CM | POA: Diagnosis not present

## 2022-11-17 DIAGNOSIS — Z794 Long term (current) use of insulin: Secondary | ICD-10-CM | POA: Diagnosis not present

## 2022-11-17 DIAGNOSIS — E1165 Type 2 diabetes mellitus with hyperglycemia: Secondary | ICD-10-CM | POA: Diagnosis not present

## 2022-11-17 LAB — GLUCOSE, CAPILLARY
Glucose-Capillary: 183 mg/dL — ABNORMAL HIGH (ref 70–99)
Glucose-Capillary: 249 mg/dL — ABNORMAL HIGH (ref 70–99)
Glucose-Capillary: 227 mg/dL — ABNORMAL HIGH (ref 70–99)

## 2022-11-17 MED ORDER — DILTIAZEM HCL ER COATED BEADS 120 MG PO CP24
120.0000 mg | ORAL_CAPSULE | Freq: Every day | ORAL | Status: DC
  Administered 2022-11-17: 120 mg via ORAL
  Filled 2022-11-17 (×2): qty 1

## 2022-11-17 MED ORDER — QUETIAPINE FUMARATE 25 MG PO TABS
25.0000 mg | ORAL_TABLET | Freq: Every day | ORAL | Status: DC
  Administered 2022-11-17: 25 mg via ORAL
  Filled 2022-11-17 (×2): qty 1

## 2022-11-17 NOTE — Evaluation (Signed)
Occupational Therapy Evaluation Patient Details Name: Cheyenne Frank MRN: [id number] DOB: [birth year] Today's Date: 11/17/2022   History of Present Illness Pt admitted for Afib with RVR. HIstory includes Afib, CVA, HTN, DM, dementia, and COPD.   Clinical Impression   Patient agreeable to OT evaluation. Pt presenting with decreased independence in self care, balance, functional mobility/transfers, endurance, and safety awareness. Pt poor historian 2/2 baseline cognitive deficits. PLOF obtained from chart review. PTA pt lived with daughter (has MS) and received assistance as needed for ADLs/IADLs. Pt currently functioning at Min-Mod A for bed mobility, Min A for sit<>stand, and CGA-Min A to take several steps toward bedside recliner using RW. She required Max A for LB dressing. Pt appears grossly at baseline for self-care tasks. Pt would benefit from 24/7 supervision due to cognition, however, would most likely not benefit from SNF. OT recommends ongoing therapy upon discharge to maximize safety and independence with ADLs, functional mobility, decrease fall risk, and decrease caregiver burden.     Recommendations for follow up therapy are one component of a multi-disciplinary discharge planning process, led by the attending physician.  Recommendations may be updated based on patient status, additional functional criteria and insurance authorization.   Follow Up Recommendations  Home health OT     Assistance Recommended at Discharge Frequent or constant Supervision/Assistance  Patient can return home with the following A little help with walking and/or transfers;A lot of help with bathing/dressing/bathroom;Direct supervision/assist for medications management;Direct supervision/assist for financial management;Assistance with cooking/housework;Assist for transportation;Help with stairs or ramp for entrance    Functional Status Assessment  Patient has had a recent decline in their functional status and  demonstrates the ability to make significant improvements in function in a reasonable and predictable amount of time.  Equipment Recommendations  None recommended by OT    Recommendations for Other Services       Precautions / Restrictions Precautions Precautions: Fall Restrictions Weight Bearing Restrictions: No      Mobility Bed Mobility Overal bed mobility: Needs Assistance Bed Mobility: Supine to Sit, Sit to Supine     Supine to sit: HOB elevated, Mod assist (assist for trunk elevation) Sit to supine: Min assist (for BLE management)   General bed mobility comments: step by step VCs    Transfers Overall transfer level: Needs assistance Equipment used: Rolling walker (2 wheels) Transfers: Sit to/from Stand Sit to Stand: Min assist           General transfer comment: VC for hand placement      Balance Overall balance assessment: Needs assistance Sitting-balance support: Feet supported Sitting balance-Leahy Scale: Good     Standing balance support: Bilateral upper extremity supported, During functional activity Standing balance-Leahy Scale: Fair                             ADL either performed or assessed with clinical judgement   ADL Overall ADL's : Needs assistance/impaired                     Lower Body Dressing: Maximal assistance;Sitting/lateral leans Lower Body Dressing Details (indicate cue type and reason): socks Toilet Transfer: Rolling walker (2 wheels);Min guard;Minimal assistance Toilet Transfer Details (indicate cue type and reason): simulated, short ambulatory transfer         Functional mobility during ADLs: Min guard;Rolling walker (2 wheels);Cueing for safety;Minimal assistance;Cueing for sequencing (to take ~3 steps to/from bedside recliner, intermittent assist for RW management)  Vision Patient Visual Report: No change from baseline       Perception     Praxis      Pertinent Vitals/Pain Pain  Assessment Pain Assessment: No/denies pain     Hand Dominance Right   Extremity/Trunk Assessment Upper Extremity Assessment Upper Extremity Assessment: Generalized weakness   Lower Extremity Assessment Lower Extremity Assessment: Generalized weakness       Communication Communication Communication: HOH   Cognition Arousal/Alertness: Awake/alert Behavior During Therapy: WFL for tasks assessed/performed Overall Cognitive Status: History of cognitive impairments - at baseline                                 General Comments: appears to have expressive aphasia. Does better reading lips. Oriented to self only. Follows commands ~80% of the time     General Comments       Exercises Other Exercises Other Exercises: OT provided education re: role of OT, OT POC, post acute recs, sitting up for all meals, EOB/OOB mobility with assistance, home/fall safety.     Shoulder Instructions      Home Living Family/patient expects to be discharged to:: Private residence Living Arrangements: Children Available Help at Discharge: Family;Available 24 hours/day Type of Home: House Home Access: Ramped entrance     Home Layout: Two level;Able to live on main level with bedroom/bathroom     Bathroom Shower/Tub: Other (comment) (walk in tub)   Bathroom Toilet: Handicapped height     Home Equipment: Conservation officer, nature (2 wheels);Wheelchair - manual;BSC/3in1;Shower seat - built in;Other (comment);Hospital bed (trapeze bar)   Additional Comments: Hospital bed, trapeze bar,      Prior Functioning/Environment Prior Level of Function : Needs assist             Mobility Comments: daughter reports pt primarily uses w/c at home, has been working on walking with RW & HHPT once/week, requires assistance for stand pivot transfers, intermittent assistance for bed mobility even with hospital bed & trapeze bar ADLs Comments: wears depends, assistance for peri hygiene &  bathing/dressing.        OT Problem List: Decreased strength;Decreased activity tolerance;Impaired balance (sitting and/or standing);Decreased cognition;Decreased safety awareness      OT Treatment/Interventions: Therapeutic exercise;Neuromuscular education;Energy conservation;Manual therapy;Modalities;DME and/or AE instruction;Balance training;Patient/family education;Visual/perceptual remediation/compensation;Cognitive remediation/compensation;Therapeutic activities;Splinting;Self-care/ADL training    OT Goals(Current goals can be found in the care plan section) Acute Rehab OT Goals Patient Stated Goal: none stated OT Goal Formulation: Patient unable to participate in goal setting Time For Goal Achievement: 12/01/22 Potential to Achieve Goals: Fair   OT Frequency: Min 2X/week    Co-evaluation              AM-PAC OT "6 Clicks" Daily Activity     Outcome Measure Help from another person eating meals?: None Help from another person taking care of personal grooming?: A Little Help from another person toileting, which includes using toliet, bedpan, or urinal?: A Lot Help from another person bathing (including washing, rinsing, drying)?: A Lot Help from another person to put on and taking off regular upper body clothing?: A Little Help from another person to put on and taking off regular lower body clothing?: A Lot 6 Click Score: 16   End of Session Equipment Utilized During Treatment: Gait belt;Rolling walker (2 wheels) Nurse Communication: Mobility status  Activity Tolerance: Patient tolerated treatment well Patient left: in bed;with call bell/phone within reach;with bed alarm set  OT Visit  Diagnosis: Unsteadiness on feet (R26.81);Muscle weakness (generalized) (M62.81);Other symptoms and signs involving cognitive function                Time: 1100-1123 OT Time Calculation (min): 23 min Charges:  OT General Charges $OT Visit: 1 Visit OT Evaluation $OT Eval Low Complexity:  1 Low  [facility] MS, OTR/L ascom [phone]  11/17/22, 3:16 PM

## 2022-11-17 NOTE — Evaluation (Signed)
Physical Therapy Evaluation Patient Details Name: Cheyenne Frank MRN: [id number] DOB: 1940-10-12 Today's Date: 11/17/2022  History of Present Illness  Pt admitted for Afib with RVR. HIstory includes Afib, CVA, HTN, DM, dementia, and COPD.  Clinical Impression  .Pt is a pleasant 82 year old female who was admitted for Afib with RVR. Pt performs bed mobility with cga, transfers with min assist, and ambulation with cga and RW. Pt demonstrates deficits with strength/cognition/mobility. Pt is poor historian and heavily relied on previous chart for equipment. Spoke to care team who has been in communication with family. Pt is grossly at baseline in regards to functional mobility. Due to cognition, would benefit from 24/7 supervision, however most likely wouldn't benefit from SNF. Discussed with TOC. Would benefit from skilled PT to address above deficits and promote optimal return to PLOF. Recommend transition to [facility] upon discharge from acute hospitalization.      Recommendations for follow up therapy are one component of a multi-disciplinary discharge planning process, led by the attending physician.  Recommendations may be updated based on patient status, additional functional criteria and insurance authorization.  Follow Up Recommendations Home health PT      Assistance Recommended at Discharge Frequent or constant Supervision/Assistance  Patient can return home with the following  A little help with walking and/or transfers;A little help with bathing/dressing/bathroom;Direct supervision/assist for medications management;Assist for transportation;Help with stairs or ramp for entrance    Equipment Recommendations None recommended by PT  Recommendations for Other Services       Functional Status Assessment Patient has had a recent decline in their functional status and demonstrates the ability to make significant improvements in function in a reasonable and predictable amount of time.      Precautions / Restrictions Precautions Precautions: Fall Restrictions Weight Bearing Restrictions: No      Mobility  Bed Mobility Overal bed mobility: Needs Assistance Bed Mobility: Supine to Sit     Supine to sit: Min guard     General bed mobility comments: safe technique and follows commands. Does require cues for sequencing including B LE management and scooting out towards EOB.    Transfers Overall transfer level: Needs assistance Equipment used: Rolling walker (2 wheels) Transfers: Sit to/from Stand Sit to Stand: Min assist           General transfer comment: needs cues for initiating transfer. Once standing, safe technique and use of RW    Ambulation/Gait Ambulation/Gait assistance: Min guard Gait Distance (Feet): 3 Feet Assistive device: Rolling walker (2 wheels) Gait Pattern/deviations: Step-to pattern       General Gait Details: pt able to take steps over to recliner and is requesting to sit and eat her lunch. Lunch tray given. Safe technique  Stairs            Wheelchair Mobility    Modified Rankin (Stroke Patients Only)       Balance Overall balance assessment: Needs assistance Sitting-balance support: Feet supported Sitting balance-Leahy Scale: Good     Standing balance support: Bilateral upper extremity supported Standing balance-Leahy Scale: Fair                               Pertinent Vitals/Pain Pain Assessment Pain Assessment: No/denies pain    Home Living Family/patient expects to be discharged to:: Private residence Living Arrangements: Children Available Help at Discharge: Family;Available 24 hours/day Type of Home: House Home Access: Ramped entrance  Home Layout: Two level;Able to live on main level with bedroom/bathroom Home Equipment: Rolling Walker (2 wheels);Wheelchair - manual;BSC/3in1;Shower seat - built in;Other (comment);Hospital bed Additional Comments: Hospital bed, trapeze bar,     Prior Function Prior Level of Function : Needs assist             Mobility Comments: daughter reports pt primarily uses w/c at home, has been working on walking with RW & HHPT once/week, requires assistance for stand pivot transfers, intermittent assistance for bed mobility even with hospital bed & trapeze bar ADLs Comments: wears depends, assistance for peri hygiene & bathing/dressing.     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 Does better reading lips. Follows commands 80% of the time        General Comments      Exercises     Assessment/Plan    PT Assessment Patient needs continued PT services  PT Problem List Decreased strength;Decreased balance;Decreased mobility;Decreased cognition       PT Treatment Interventions Gait training;DME instruction;Therapeutic exercise;Therapeutic activities    PT Goals (Current goals can be found in the Care Plan section)  Acute Rehab PT Goals Patient Stated Goal: to eat lunch PT Goal Formulation: Patient unable to participate in goal setting Time For Goal Achievement: 12/01/22 Potential to Achieve Goals: Fair    Frequency Min 2X/week     Co-evaluation               AM-PAC PT "6 Clicks" Mobility  Outcome Measure Help needed turning from your back to your side while in a flat bed without using bedrails?: A Little Help needed moving from lying on your back to sitting on the side of a flat bed  without using bedrails?: A Little Help needed moving to and from a bed to a chair (including a wheelchair)?: A Little Help needed standing up from a chair using your arms (e.g., wheelchair or bedside chair)?: A Little Help needed to walk in hospital room?: A Little Help needed climbing 3-5 steps with a railing? : A Lot 6 Click Score: 17    End of Session Equipment Utilized During Treatment: Gait belt Activity Tolerance: Patient tolerated treatment well Patient left: in chair (chair alarm pad present, no alarm box, RN notified) Nurse Communication: Mobility status PT Visit Diagnosis: Unsteadiness on feet (R26.81);Muscle weakness (generalized) (M62.81);Difficulty in walking, not elsewhere classified (R26.2)    Time: [id number] PT Time Calculation (min) (ACUTE ONLY): 15 min   Charges:   PT Evaluation $PT Eval Low Complexity: 1 Low          Greggory Stallion, PT, DPT, GCS [phone]   Karee [place]ge 11/17/2022, 2:12 PM

## 2022-11-17 NOTE — Progress Notes (Addendum)
Progress Note    Cheyenne Frank  [id number] DOB: Oct 11, 1940  DOA: 11/15/2022 PCP: [facility]      Brief Narrative:    Medical records reviewed and are as summarized below:  GARNA ROTUNDO is a 82 y.o. female  with medical history significant for Paroxysmal A-fib on Eliquis, prior stroke, HTN, insulin-dependent type 2 diabetes, sleep apnea, dementia, CKD stage IIIb, COPD, diastolic CHF brought in by EMS with shortness of breath and altered mental status described as anxiety, tearfulness and calling out for deceased family members.        Assessment/Plan:   Principal Problem:   Atrial fibrillation with rapid ventricular response (HCC) Active Problems:   Uncontrolled type 2 diabetes mellitus with hyperglycemia, with long-term current use of insulin (HCC)   Stage 3b chronic kidney disease (CKD) (HCC)   Essential hypertension   Obesity, Class III, BMI 40-49.9 (morbid obesity) ([place])   History of CVA (cerebrovascular accident)   Seizure disorder ([place])   Atrial fibrillation ([place])    Body mass index is 40.62 kg/m.  (Morbid obesity)   Atrial fibrillation with RVR (HCC) She is still in normal sinus rhythm.  Change short-acting oral Cardizem to long-acting oral Cardizem and monitor heart rate.  Continue carvedilol.  Continue Eliquis for stroke prophylaxis.     Chronic diastolic CHF (congestive heart failure) ([place]) Patient has chronic diastolic dysfunction CHF with last known LVEF of 60 to 65% from a 2D echocardiogram which was done 02/23 Compensated.  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 Maintain consistent carbohydrate diet Sliding scale with NovoLog  insulin    Dementia with behavioral disturbance (HCC) Patient got Versed in the ED Currently stable Continue Paxil    History of CVA (cerebrovascular accident) Patient has a history of a prior CVA and at baseline has mild cognitive impairment and is wheelchair-bound but able to ambulate short distances per daughter. Continue aspirin and statin    Obesity, Class III, BMI 40-49.9 (morbid obesity) (HCC) BMI 40 Complicates overall prognosis and care    History of seizure disorder [facility]) Patient no longer takes Keppra    General weakness PT recommends discharge to SNF.    Diet Order             Diet heart healthy/carb modified Room service appropriate? Yes; Fluid consistency: Thin  Diet effective now                            Consultants: None  Procedures: None    Medications:    apixaban  5 mg Oral BID   aspirin EC  81 mg Oral Daily   carvedilol  25 mg Oral BID WC   diltiazem  120 mg Oral Daily   insulin aspart  0-15 Units Subcutaneous TID WC   insulin aspart  0-5 Units Subcutaneous QHS   insulin glargine-yfgn  35 Units Subcutaneous Daily   losartan  25 mg Oral Daily   pantoprazole  40 mg Oral Daily   PARoxetine  40 mg Oral QHS   rosuvastatin  2.5 mg Oral QHS   torsemide  20 mg Oral Daily   traZODone  75 mg Oral QHS  Continuous Infusions:   Anti-infectives (From admission, onward)    None              Family Communication/Anticipated D/C date and plan/Code Status   DVT prophylaxis:  apixaban (ELIQUIS) tablet 5 mg     Code Status: DNR  Family Communication: None Disposition Plan: May need SNF at discharge   Status is: Inpatient Remains inpatient appropriate because: A-fib with RVR, awaiting placement       Subjective:   Interval events noted.  She has no complaints.  Terri, RN, was at the bedside  Objective:    Vitals:   11/17/22 0404 11/17/22 0612 11/17/22 0729 11/17/22 1206  BP: (!) 150/66 134/61 (!)  160/65   Pulse: 65 66 60   Resp: 18  15   Temp: 98.1 F (36.7 C)  98.2 F (36.8 C)   TempSrc: Oral  Oral   SpO2: 93% 94% 95%   Weight:    104 kg  Height:       No data found.   Intake/Output Summary (Last 24 hours) at 11/17/2022 1301 Last data filed at 11/17/2022 0700 Gross per 24 hour  Intake 218.94 ml  Output 700 ml  Net -481.06 ml   Filed Weights   11/15/22 2350 11/17/22 1206  Weight: 106.6 kg 104 kg    Exam:  GEN: NAD SKIN: No rash EYES: EOMI ENT: MMM CV: RRR PULM: CTA B ABD: soft, obese, NT, +BS CNS: AAO x 2 (person and place), non focal EXT: No edema or tenderness        Data Reviewed:   I have personally reviewed following labs and imaging studies:  Labs: Labs show the following:   Basic Metabolic Panel: Recent Labs  Lab 11/16/22 0001  NA 135  K 4.0  CL 95*  CO2 30  GLUCOSE 353*  BUN 41*  CREATININE 1.45*  CALCIUM 9.4  MG 1.9   GFR Estimated Creatinine Clearance: 35.1 mL/min (A) (by C-G formula based on SCr of 1.45 mg/dL (H)). Liver Function Tests: Recent Labs  Lab 11/16/22 0001  AST 27  ALT 18  ALKPHOS 100  BILITOT 0.7  PROT 6.8  ALBUMIN 3.2*   No results for input(s): "LIPASE", "AMYLASE" in the last 168 hours. No results for input(s): "AMMONIA" in the last 168 hours. Coagulation profile No results for input(s): "INR", "PROTIME" in the last 168 hours.  CBC: Recent Labs  Lab 11/16/22 0001  WBC 7.6  NEUTROABS 5.4  HGB 12.4  HCT 38.4  MCV 89.3  PLT 142*   Cardiac Enzymes: No results for input(s): "CKTOTAL", "CKMB", "CKMBINDEX", "TROPONINI" in the last 168 hours. BNP (last 3 results) No results for input(s): "PROBNP" in the last 8760 hours. CBG: Recent Labs  Lab 11/16/22 1327 11/16/22 1636 11/16/22 2010 11/17/22 0811 11/17/22 1104  GLUCAP 347* 211* 133* 183* 249*   D-Dimer: No results for input(s): "DDIMER" in the last 72 hours. Hgb A1c: No results for input(s): "HGBA1C" in the last 72 hours. Lipid  Profile: No results for input(s): "CHOL", "HDL", "LDLCALC", "TRIG", "CHOLHDL", "LDLDIRECT" in the last 72 hours. Thyroid function studies: No results for input(s): "TSH", "T4TOTAL", "T3FREE", "THYROIDAB" in the last 72 hours.  Invalid input(s): "FREET3" Anemia work up: No results for input(s): "VITAMINB12", "FOLATE", "FERRITIN", "TIBC", "IRON", "RETICCTPCT" in the last 72 hours. Sepsis Labs: Recent Labs  Lab 11/16/22 0001  WBC 7.6    Microbiology Recent Results (from the past 240 hour(s))  Resp panel by RT-PCR (RSV, Flu A&B, Covid)  Anterior Nasal Swab     Status: None   Collection Time: 11/16/22 12:01 AM   Specimen: Anterior Nasal Swab  Result Value Ref Range Status   SARS Coronavirus 2 by RT PCR NEGATIVE NEGATIVE Final    Comment: (NOTE) SARS-CoV-2 target nucleic acids are NOT DETECTED.  The SARS-CoV-2 RNA is generally detectable in upper respiratory specimens during the acute phase of infection. The lowest concentration of SARS-CoV-2 viral copies this assay can detect is 138 copies/mL. A negative result does not preclude SARS-Cov-2 infection and should not be used as the sole basis for treatment or other patient management decisions. A negative result may occur with  improper specimen collection/handling, submission of specimen other than nasopharyngeal swab, presence of viral mutation(s) within the areas targeted by this assay, and inadequate number of viral copies(<138 copies/mL). A negative result must be combined with clinical observations, patient history, and epidemiological information. The expected result is Negative.  Fact Sheet for Patients:  EntrepreneurPulse.com.au  Fact Sheet for Healthcare Providers:  IncredibleEmployment.be  This test is no t yet approved or cleared by the [place] FDA and  has been authorized for detection and/or diagnosis of SARS-CoV-2 by FDA under an Emergency Use Authorization (EUA). This EUA  will remain  in effect (meaning this test can be used) for the duration of the COVID-19 declaration under Section 564(b)(1) of the Act, 21 U.S.C.section 360bbb-3(b)(1), unless the authorization is terminated  or revoked sooner.       Influenza A by PCR NEGATIVE NEGATIVE Final   Influenza B by PCR NEGATIVE NEGATIVE Final    Comment: (NOTE) The Xpert Xpress SARS-CoV-2/FLU/RSV plus assay is intended as an aid in the diagnosis of influenza from Nasopharyngeal swab specimens and should not be used as a sole basis for treatment. Nasal washings and aspirates are unacceptable for Xpert Xpress SARS-CoV-2/FLU/RSV testing.  Fact Sheet for Patients: EntrepreneurPulse.com.au  Fact Sheet for Healthcare Providers: IncredibleEmployment.be  This test is not yet approved or cleared by the [place] FDA and has been authorized for detection and/or diagnosis of SARS-CoV-2 by FDA under an Emergency Use Authorization (EUA). This EUA will remain in effect (meaning this test can be used) for the duration of the COVID-19 declaration under Section 564(b)(1) of the Act, 21 U.S.C. section 360bbb-3(b)(1), unless the authorization is terminated or revoked.     Resp Syncytial Virus by PCR NEGATIVE NEGATIVE Final    Comment: (NOTE) Fact Sheet for Patients: EntrepreneurPulse.com.au  Fact Sheet for Healthcare Providers: IncredibleEmployment.be  This test is not yet approved or cleared by the [place] FDA and has been authorized for detection and/or diagnosis of SARS-CoV-2 by FDA under an Emergency Use Authorization (EUA). This EUA will remain in effect (meaning this test can be used) for the duration of the COVID-19 declaration under Section 564(b)(1) of the Act, 21 U.S.C. section 360bbb-3(b)(1), unless the authorization is terminated or revoked.  Performed at [facility], [address] [place],   [address]     Procedures and diagnostic studies:  CT HEAD WO CONTRAST (5MM)  Result Date: 11/16/2022 CLINICAL DATA:  Altered breathing and altered mental status. EXAM: CT HEAD WITHOUT CONTRAST TECHNIQUE: Contiguous axial images were obtained from the base of the skull through the vertex without intravenous contrast. RADIATION DOSE REDUCTION: This exam was performed according to the departmental dose-optimization program which includes automated exposure control, adjustment of the mA and/or kV according to patient size and/or use of iterative reconstruction technique. COMPARISON:  July 17, 2022 FINDINGS: Brain:  There is mild cerebral atrophy with widening of the extra-axial spaces and ventricular dilatation. There are areas of decreased attenuation within the white matter tracts of the supratentorial brain, consistent with microvascular disease changes. Chronic bilateral cerebellar and left occipital lobe infarcts are seen. Chronic left thalamic and right periventricular lacunar infarcts are also noted. Vascular: No hyperdense vessel or unexpected calcification. Skull: Normal. Negative for fracture or focal lesion. Sinuses/Orbits: No acute finding. Other: None. IMPRESSION: 1. No acute intracranial abnormality. 2. 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 3. Chronic left thalamic and right periventricular lacunar infarcts. 4. Generalized cerebral atrophy and microvascular disease changes of the supratentorial brain. Electronically Signed   By: Virgina Norfolk M.D.   On: 11/16/2022 00:11   DG Chest Portable 1 View  Result Date: 11/16/2022 CLINICAL DATA:  Difficulty breathing. EXAM: PORTABLE CHEST 1 VIEW COMPARISON:  October 21, 2022 FINDINGS: The cardiac silhouette is mildly enlarged and unchanged in size. There is no evidence of an acute infiltrate, pleural effusion or pneumothorax. Chronic seventh and eighth right rib fractures are seen. Postoperative changes are noted within the lower  thoracic and upper lumbar spine. IMPRESSION: Stable cardiomegaly without evidence of acute or active cardiopulmonary disease. Electronically Signed   By: Virgina Norfolk M.D.   On: 11/16/2022 00:08               LOS: 1 day   Greogry Goodwyn  [facility]   Pager on [email or web address]. If 7PM-7AM, please contact night-coverage at [email or web address]     11/17/2022, 1:01 PM

## 2022-11-18 DIAGNOSIS — Z8673 Personal history of transient ischemic attack (TIA), and cerebral infarction without residual deficits: Secondary | ICD-10-CM | POA: Diagnosis not present

## 2022-11-18 DIAGNOSIS — E1165 Type 2 diabetes mellitus with hyperglycemia: Secondary | ICD-10-CM | POA: Diagnosis not present

## 2022-11-18 DIAGNOSIS — Z794 Long term (current) use of insulin: Secondary | ICD-10-CM | POA: Diagnosis not present

## 2022-11-18 DIAGNOSIS — I4891 Unspecified atrial fibrillation: Secondary | ICD-10-CM | POA: Diagnosis not present

## 2022-11-18 LAB — BASIC METABOLIC PANEL
Anion gap: 11 (ref 5–15)
BUN: 44 mg/dL — ABNORMAL HIGH (ref 8–23)
CO2: 31 mmol/L (ref 22–32)
Calcium: 9 mg/dL (ref 8.9–10.3)
Chloride: 98 mmol/L (ref 98–111)
Creatinine, Ser: 1.61 mg/dL — ABNORMAL HIGH (ref 0.44–1.00)
GFR, Estimated: 32 mL/min — ABNORMAL LOW (ref >60)
Glucose, Bld: 125 mg/dL — ABNORMAL HIGH (ref 70–99)
Potassium: 3.6 mmol/L (ref 3.5–5.1)
Sodium: 140 mmol/L (ref 135–145)

## 2022-11-18 LAB — GLUCOSE, CAPILLARY
Glucose-Capillary: 170 mg/dL — ABNORMAL HIGH (ref 70–99)
Glucose-Capillary: 235 mg/dL — ABNORMAL HIGH (ref 70–99)

## 2022-11-18 MED ORDER — INSULIN LISPRO 100 UNIT/ML IJ SOLN: 10.0000 [IU] | Freq: Three times a day (TID) | INTRAMUSCULAR | Status: DC

## 2022-11-18 MED ORDER — INSULIN GLARGINE 100 UNIT/ML ~~LOC~~ SOLN: 64.0000 [IU] | Freq: Every evening | SUBCUTANEOUS | Status: DC

## 2022-11-18 MED ORDER — TRAZODONE HCL 50 MG PO TABS: 75.0000 mg | ORAL_TABLET | Freq: Every day | ORAL | Status: DC

## 2022-11-18 NOTE — TOC Transition Note (Signed)
Transition of Care [facility]) - CM/SW Discharge Note   Patient Details  Name: Cheyenne Frank MRN: [id number] Date of Birth: 1941-04-25  Transition of Care [facility]) CM/SW Contact:  Raina Mina, [place] Phone Number: 11/18/2022, 1:08 PM   Clinical Narrative:   CSW spoke with [place] who will resume home health PT when patient discharges home.           Patient Goals and CMS Choice      Discharge Placement                         Discharge Plan and Services Additional resources added to the After Visit Summary for                                       Social Determinants of Health (SDOH) Interventions SDOH Screenings   Food Insecurity: No Food Insecurity (11/16/2022)  Housing: [place]  (11/16/2022)  Transportation Needs: No Transportation Needs (11/16/2022)  Utilities: Not At Risk (11/16/2022)  Tobacco Use: Low Risk  (11/16/2022)     Readmission Risk Interventions    07/18/2022    3:27 PM 01/28/2022    2:28 PM  Readmission Risk Prevention Plan  Transportation Screening Complete Complete  PCP or Specialist Appt within 3-5 Days  Complete  HRI or [place]  Complete  Social Work Consult for [place] Planning/Counseling  Complete  Palliative Care Screening  Complete  Medication Review Press photographer) Complete Complete  PCP or Specialist appointment within 3-5 days of discharge Complete   HRI or [place] Complete   SW Recovery Care/Counseling Consult Not Complete   SW Consult Not Complete Comments NA   Palliative Care Screening Not [place] Not Applicable

## 2022-11-18 NOTE — Discharge Summary (Signed)
Physician Discharge Summary   Patient: Cheyenne Frank MRN: [id number] DOB: December 12, 1940  Admit date:     11/15/2022  Discharge date: 11/18/22  Discharge Physician: Jennye Boroughs   PCP: [facility]   Recommendations at discharge:   Follow-up with PCP in 1 to 2 weeks  Discharge Diagnoses: Principal Problem:   Atrial fibrillation with rapid ventricular response (HCC) Active Problems:   Uncontrolled type 2 diabetes mellitus with hyperglycemia, with long-term current use of insulin (HCC)   Stage 3b chronic kidney disease (CKD) (HCC)   Essential hypertension   Obesity, Class III, BMI 40-49.9 (morbid obesity) ([place])   History of CVA (cerebrovascular accident)   Seizure disorder ([place])   Atrial fibrillation ([place])  Resolved Problems:   * No resolved hospital problems. *  Hospital Course:  Cheyenne Frank is a 82 y.o. female  with medical history significant for Paroxysmal A-fib on Eliquis, prior stroke, HTN, insulin-dependent type 2 diabetes, sleep apnea, dementia, CKD stage IIIb, COPD, diastolic CHF brought in by EMS with shortness of breath and altered mental status described as anxiety, tearfulness and calling out for deceased family members.    She was admitted to the hospital with A-fib with RVR.  She was treated with IV Cardizem infusion.  She converted to normal sinus rhythm when she was switched to oral Cardizem.   Assessment and Plan:   Atrial fibrillation with RVR (HCC) She is back in normal sinus rhythm.  Continue carvedilol, diltiazem and Eliquis.     Chronic diastolic CHF (congestive heart failure) ([place]) Patient has chronic diastolic dysfunction CHF with last known LVEF of 60 to 65% from a 2D echocardiogram which was done 02/23 Compensated.  Continue torsemide, losartan     Uncontrolled type 2 diabetes mellitus with hyperglycemia with long-term current use of insulin Continue insulin glargine and NovoLog as needed for hyperglycemia     CKD  stage 3b due to type 2 diabetes mellitus ([place]) Patient has diabetes mellitus with complications of stage IIIb chronic kidney disease.     Dementia with behavioral disturbance ([place]) Continue Paxil supportive care     History of CVA (cerebrovascular accident) Patient has a history of a prior CVA and at baseline has mild cognitive impairment and is wheelchair-bound but able to ambulate short distances per daughter. Continue aspirin and statin     Obesity, Class III, BMI 40-49.9 (morbid obesity) (HCC) BMI 40 Complicates overall prognosis and care     History of seizure disorder [facility]) Patient no longer takes Keppra     General weakness PT recommended discharge to home with home health therapy.     Overall, patient's condition has improved and she is deemed stable for discharge today.  Discharge plan was discussed with [name], daughter, at bedside.  [name] said she will hire private caregivers to assist patient at home.       Consultants: None Procedures performed: None  Disposition: Home health Diet recommendation:  Discharge Diet Orders (From admission, onward)     Start     Ordered   11/18/22 0000  Diet - low sodium heart healthy        11/18/22 1201   11/18/22 0000  Diet Carb Modified        11/18/22 1201           Cardiac and Carb modified diet DISCHARGE MEDICATION: Allergies as of 11/18/2022       Reactions   Codeine Palpitations   Other Reaction: RACES HEART   Dilaudid [  hydromorphone] Other (See Comments), Rash   Other Reaction: lightheaded, altered ms, nervous, Paranoia paranoid Other Reaction: lightheaded, altered ms, nervous, Paranoia   Demerol [meperidine] Other (See Comments), Rash   agitated Agitation   Pravastatin Other (See Comments)   Muscle aches   Amiodarone Other (See Comments)   Per family pt had twitching while taking and cardiologist took pt off    Keppra [levetiracetam]    Lipitor [atorvastatin] Other (See Comments), Itching   unknown    Nifedipine Other (See Comments)   unknown   Statins Rash   Makes her crazy   Sulfa Antibiotics Rash   Vancomycin Rash   deafness        Medication List     TAKE these medications    acetaminophen 500 MG tablet Commonly known as: TYLENOL Take 500-1,000 mg by mouth every 6 (six) hours as needed for mild pain or fever.   albuterol 108 (90 Base) MCG/ACT inhaler Commonly known as: VENTOLIN HFA Inhale 2 puffs into the lungs every 6 (six) hours as needed for wheezing or shortness of breath.   apixaban 5 MG Tabs tablet Commonly known as: Eliquis Take 1 tablet (5 mg total) by mouth 2 (two) times daily.   aspirin EC 81 MG tablet Take 1 tablet (81 mg total) by mouth daily. MAY RESUME ON 12/31/20 What changed: additional instructions   carvedilol 25 MG tablet Commonly known as: COREG Take 25 mg by mouth 2 (two) times daily.   diltiazem 120 MG 24 hr capsule Commonly known as: Cardizem CD Take 1 capsule (120 mg total) by mouth daily.   gabapentin 300 MG capsule Commonly known as: NEURONTIN Take 1 capsule (300 mg total) by mouth 2 (two) times daily.   hydrALAZINE 25 MG tablet Commonly known as: APRESOLINE Take 1 tablet (25 mg total) by mouth 3 (three) times daily as needed (For systolic above 0000000).   ICAPS AREDS 2 PO Take 1 capsule by mouth in the morning and at bedtime.   insulin glargine 100 UNIT/ML injection Commonly known as: LANTUS Inject 0.64 mLs (64 Units total) into the skin at bedtime.   insulin lispro 100 UNIT/ML injection Commonly known as: HumaLOG Inject 0.1 mLs (10 Units total) into the skin 3 (three) times daily with meals.   losartan 25 MG tablet Commonly known as: COZAAR Take 25 mg by mouth daily.   multivitamin with minerals Tabs tablet Take 1 tablet by mouth daily.   omeprazole 40 MG capsule Commonly known as: PRILOSEC Take 40 mg by mouth daily.   oxybutynin 10 MG 24 hr tablet Commonly known as: DITROPAN-XL Take 10 mg by mouth at bedtime.    PARoxetine 40 MG tablet Commonly known as: PAXIL Take 1 tablet (40 mg total) by mouth at bedtime.   rosuvastatin 5 MG tablet Commonly known as: CRESTOR Take 2.5 mg by mouth at bedtime.   sitaGLIPtin 50 MG tablet Commonly known as: JANUVIA Take 50 mg by mouth daily.   torsemide 20 MG tablet Commonly known as: DEMADEX Take 1 tablet (20 mg total) by mouth daily. Please start from 11/01/2021   traZODone 50 MG tablet Commonly known as: DESYREL Take 1.5 tablets (75 mg total) by mouth at bedtime.        Discharge Exam: Filed Weights   11/15/22 2350 11/17/22 1206  Weight: 106.6 kg 104 kg   GEN: NAD SKIN: Warm and dry EYES: Anicteric ENT: MMM CV: RRR PULM: CTA B ABD: soft, obese, NT, +BS CNS: AAO x 3, non  focal EXT: No edema or tenderness   P[place], RN, was at the bedside during this encounter   Condition at discharge: stable  The results of significant diagnostics from this hospitalization (including imaging, microbiology, ancillary and laboratory) are listed below for reference.   Imaging Studies: CT HEAD WO CONTRAST (5MM)  Result Date: 11/16/2022 CLINICAL DATA:  Altered breathing and altered mental status. EXAM: CT HEAD WITHOUT CONTRAST TECHNIQUE: Contiguous axial images were obtained from the base of the skull through the vertex without intravenous contrast. RADIATION DOSE REDUCTION: This exam was performed according to the departmental dose-optimization program which includes automated exposure control, adjustment of the mA and/or kV according to patient size and/or use of iterative reconstruction technique. COMPARISON:  July 17, 2022 FINDINGS: Brain: There is mild cerebral atrophy with widening of the extra-axial spaces and ventricular dilatation. There are areas of decreased attenuation within the white matter tracts of the supratentorial brain, consistent with microvascular disease changes. Chronic bilateral cerebellar and left occipital lobe infarcts are seen.  Chronic left thalamic and right periventricular lacunar infarcts are also noted. Vascular: No hyperdense vessel or unexpected calcification. Skull: Normal. Negative for fracture or focal lesion. Sinuses/Orbits: No acute finding. Other: None. IMPRESSION: 1. No acute intracranial abnormality. 2. 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 3. Chronic left thalamic and right periventricular lacunar infarcts. 4. Generalized cerebral atrophy and microvascular disease changes of the supratentorial brain. Electronically Signed   By: Virgina Norfolk M.D.   On: 11/16/2022 00:11   DG Chest Portable 1 View  Result Date: 11/16/2022 CLINICAL DATA:  Difficulty breathing. EXAM: PORTABLE CHEST 1 VIEW COMPARISON:  October 21, 2022 FINDINGS: The cardiac silhouette is mildly enlarged and unchanged in size. There is no evidence of an acute infiltrate, pleural effusion or pneumothorax. Chronic seventh and eighth right rib fractures are seen. Postoperative changes are noted within the lower thoracic and upper lumbar spine. IMPRESSION: Stable cardiomegaly without evidence of acute or active cardiopulmonary disease. Electronically Signed   By: Virgina Norfolk M.D.   On: 11/16/2022 00:08   DG Chest Port 1 View  Result Date: 10/21/2022 CLINICAL DATA:  Chest pain. Chest pressure. Short of breath. Productive cough. EXAM: PORTABLE CHEST 1 VIEW COMPARISON:  09/14/2022 FINDINGS: Mild cardiac enlargement. No vascular congestion, edema, or consolidation. No evidence of pleural effusion or pneumothorax. Mediastinal contours appear intact. Degenerative changes in the spine and shoulders. Postoperative changes in the thoracolumbar spine. IMPRESSION: Cardiac enlargement.  No evidence of active pulmonary disease. Electronically Signed   By: Lucienne Capers M.D.   On: 10/21/2022 18:10    Microbiology: Results for orders placed or performed during the hospital encounter of 11/15/22  Resp panel by RT-PCR (RSV, Flu A&B,  Covid) Anterior Nasal Swab     Status: None   Collection Time: 11/16/22 12:01 AM   Specimen: Anterior Nasal Swab  Result Value Ref Range Status   SARS Coronavirus 2 by RT PCR NEGATIVE NEGATIVE Final    Comment: (NOTE) SARS-CoV-2 target nucleic acids are NOT DETECTED.  The SARS-CoV-2 RNA is generally detectable in upper respiratory specimens during the acute phase of infection. The lowest concentration of SARS-CoV-2 viral copies this assay can detect is 138 copies/mL. A negative result does not preclude SARS-Cov-2 infection and should not be used as the sole basis for treatment or other patient management decisions. A negative result may occur with  improper specimen collection/handling, submission of specimen other than nasopharyngeal swab, presence of viral mutation(s) within the areas targeted by this assay,  and inadequate number of viral copies(<138 copies/mL). A negative result must be combined with clinical observations, patient history, and epidemiological information. The expected result is Negative.  Fact Sheet for Patients:  EntrepreneurPulse.com.au  Fact Sheet for Healthcare Providers:  IncredibleEmployment.be  This test is no t yet approved or cleared by the [place] FDA and  has been authorized for detection and/or diagnosis of SARS-CoV-2 by FDA under an Emergency Use Authorization (EUA). This EUA will remain  in effect (meaning this test can be used) for the duration of the COVID-19 declaration under Section 564(b)(1) of the Act, 21 U.S.C.section 360bbb-3(b)(1), unless the authorization is terminated  or revoked sooner.       Influenza A by PCR NEGATIVE NEGATIVE Final   Influenza B by PCR NEGATIVE NEGATIVE Final    Comment: (NOTE) The Xpert Xpress SARS-CoV-2/FLU/RSV plus assay is intended as an aid in the diagnosis of influenza from Nasopharyngeal swab specimens and should not be used as a sole basis for treatment. Nasal  washings and aspirates are unacceptable for Xpert Xpress SARS-CoV-2/FLU/RSV testing.  Fact Sheet for Patients: EntrepreneurPulse.com.au  Fact Sheet for Healthcare Providers: IncredibleEmployment.be  This test is not yet approved or cleared by the [place] FDA and has been authorized for detection and/or diagnosis of SARS-CoV-2 by FDA under an Emergency Use Authorization (EUA). This EUA will remain in effect (meaning this test can be used) for the duration of the COVID-19 declaration under Section 564(b)(1) of the Act, 21 U.S.C. section 360bbb-3(b)(1), unless the authorization is terminated or revoked.     Resp Syncytial Virus by PCR NEGATIVE NEGATIVE Final    Comment: (NOTE) Fact Sheet for Patients: EntrepreneurPulse.com.au  Fact Sheet for Healthcare Providers: IncredibleEmployment.be  This test is not yet approved or cleared by the [place] FDA and has been authorized for detection and/or diagnosis of SARS-CoV-2 by FDA under an Emergency Use Authorization (EUA). This EUA will remain in effect (meaning this test can be used) for the duration of the COVID-19 declaration under Section 564(b)(1) of the Act, 21 U.S.C. section 360bbb-3(b)(1), unless the authorization is terminated or revoked.  Performed at [facility], [address] [place], [place] [address]     Labs: CBC: Recent Labs  Lab 11/16/22 0001  WBC 7.6  NEUTROABS 5.4  HGB 12.4  HCT 38.4  MCV 89.3  PLT [id number]*   Basic Metabolic Panel: Recent Labs  Lab 11/16/22 0001 11/18/22 0440  NA 135 140  K 4.0 3.6  CL 95* 98  CO2 30 31  GLUCOSE 353* 125*  BUN 41* 44*  CREATININE 1.45* 1.61*  CALCIUM 9.4 9.0  MG 1.9  --    Liver Function Tests: Recent Labs  Lab 11/16/22 0001  AST 27  ALT 18  ALKPHOS 100  BILITOT 0.7  PROT 6.8  ALBUMIN 3.2*   CBG: Recent Labs  Lab 11/16/22 2010 11/17/22 0811 11/17/22 1104  11/17/22 2059 11/18/22 0853  GLUCAP 133* 183* 249* 227* 170*    Discharge time spent: greater than 30 minutes.  Signed: Jennye Boroughs, MD [facility] 11/18/2022

## 2022-11-24 NOTE — Progress Notes (Deleted)
   Patient ID: BRETA DEMEDEIROS, female    DOB: [birth year], 82 y.o.   MRN: [id number]  HPI  Ms Riederer is a 82 y/o female with a history of  Echo 09/10/22: EF 70-75% with moderate LVH, mild LAE and mild MR.   Admitted 11/15/22 due to SOB and AMS. Found to be in AF RVR. Initially needed IV cardizem. She converted back to NSR. ED 10/21/22 d/t covid.   She presents today for her initial visit with a chief complaint of   Review of Systems    Physical Exam  Assessment & Plan:  1: Chronic heart failure with preserved ejection fraction- - NYHA class - BNP 11/16/22  2: Atrial fibrillation-  3: HTN- - BP - had telemedicine visit with PCP @ [facility] 07/03/22 - BMP 11/18/22 showed sodium 140, potassium 3.6, creatinine 1.61 & GFR 32  4: DM- - A1c 07/16/22 was 9.1%

## 2022-11-26 ENCOUNTER — Encounter: Payer: Commercial Managed Care - PPO | Admitting: Family

## 2022-11-26 ENCOUNTER — Telehealth: Payer: Self-pay | Admitting: Family

## 2022-11-26 NOTE — Telephone Encounter (Signed)
Patient did not show for her initial [facility] appointment on 11/26/22.

## 2022-11-28 ENCOUNTER — Telehealth: Payer: Self-pay

## 2022-11-28 NOTE — Telephone Encounter (Signed)
Daughter [name] called in stating that her mother missed her appt and needs to R/S from 3.11.24 appt. I LVMOM for her to call back and R/S with [place].

## 2022-12-27 IMAGING — CT CT HEAD W/O CM
4 series · 15 of 47 positions shown, 17 images · non-contrast
Comparison: Brain MRI 01/30/2022, [DATE] days ago, head CT 01/29/2022

CLINICAL DATA: Headache, new or worsening (Age >= 50y)



[Series 2: head wo · axial · 0.43mm/px · z∈[-169,-54]mm · 7 of 31 slices shown, 9 images]
[im 4/31  brain]
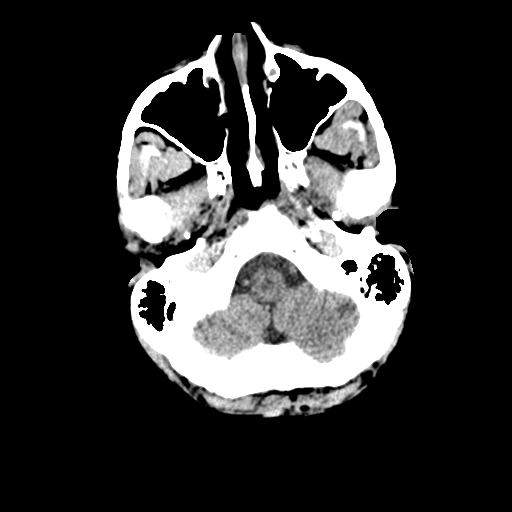
[im 4/31  bone]
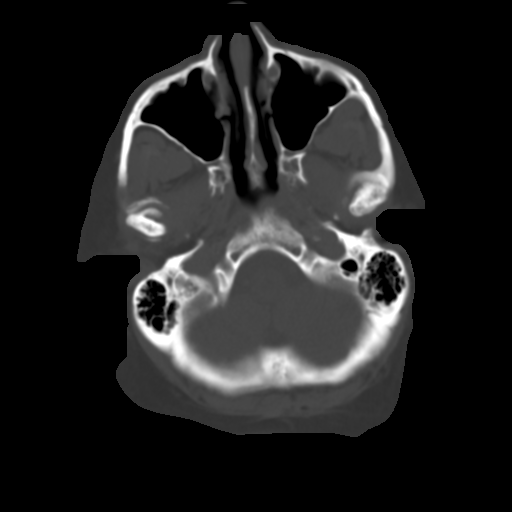
[im 8/31  brain]
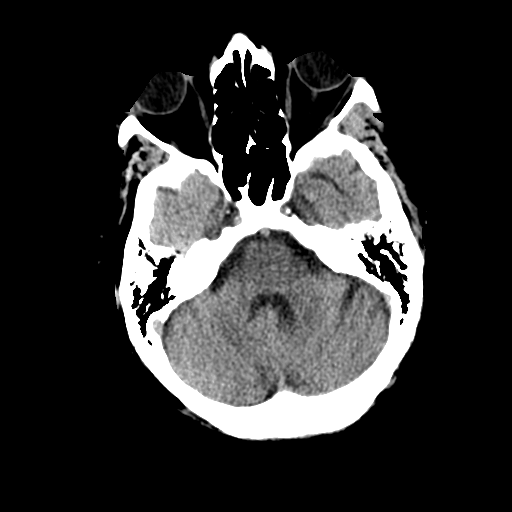
[im 12/31  brain]
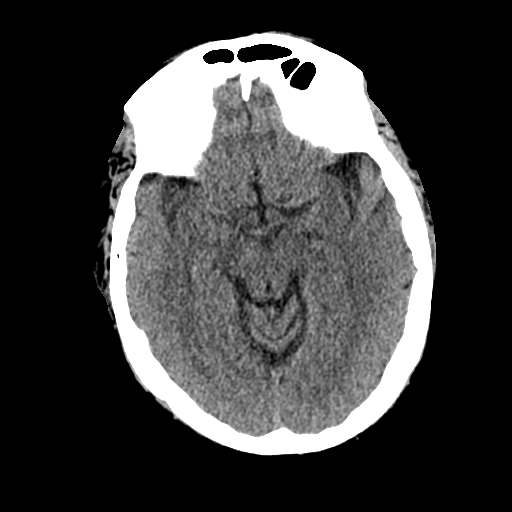
[im 16/31  brain]
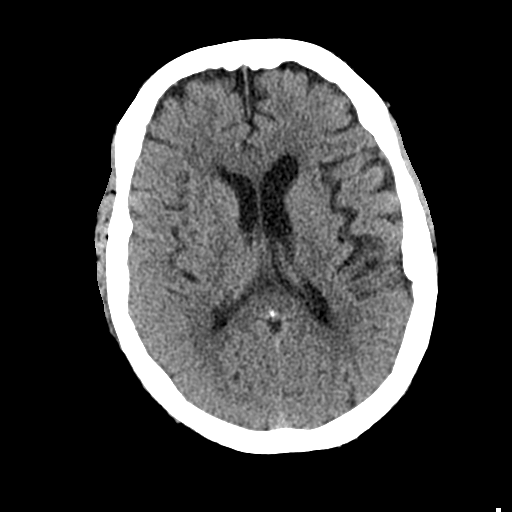
[im 19/31  brain]
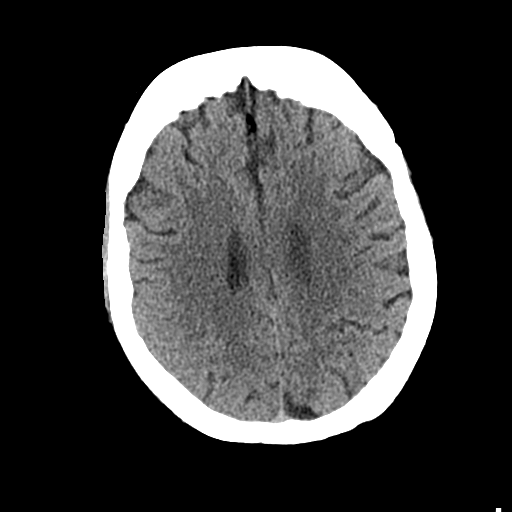
[im 19/31  bone]
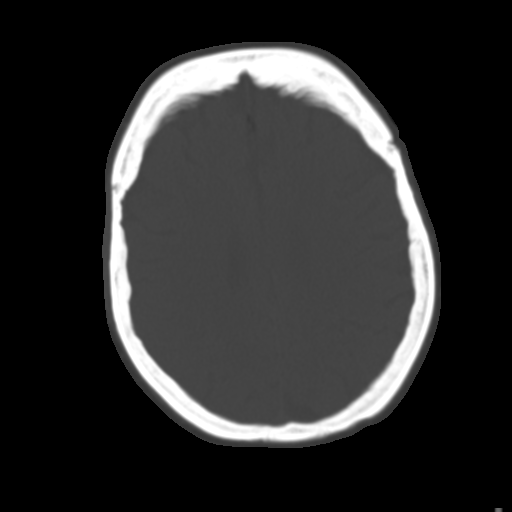
[im 23/31  brain]
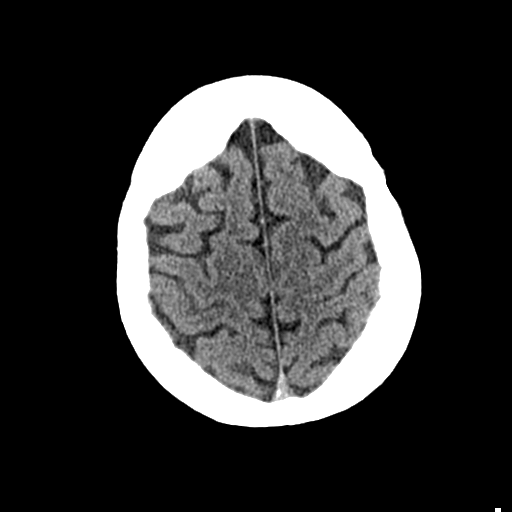
[im 27/31  brain]
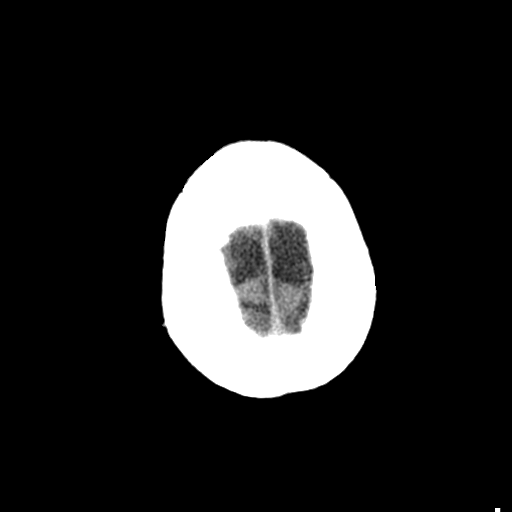

[Series 3: head bone · axial · 0.43mm/px · z∈[-170,-154]mm · 2 of 77 slices shown]
[im 8/77  bone]
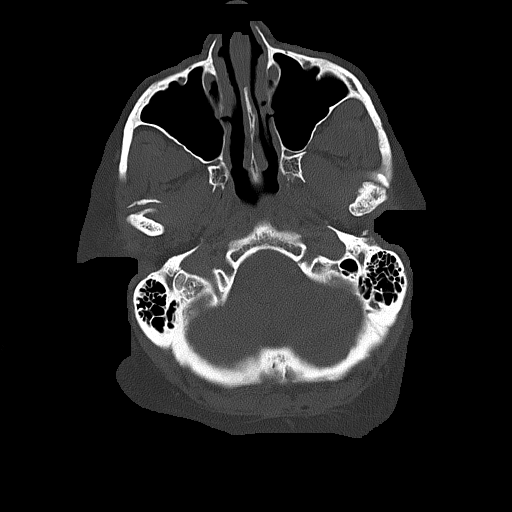
[im 16/77  bone]
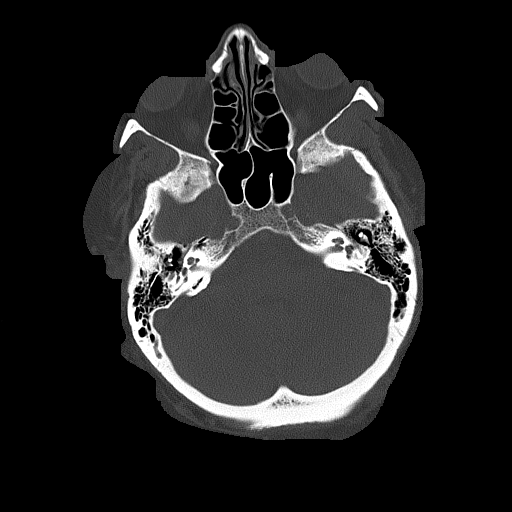

[Series 4: coronal soft tissue · coronal · 0.30mm/px · 3 of 65 slices shown]
[im 22/65  brain]
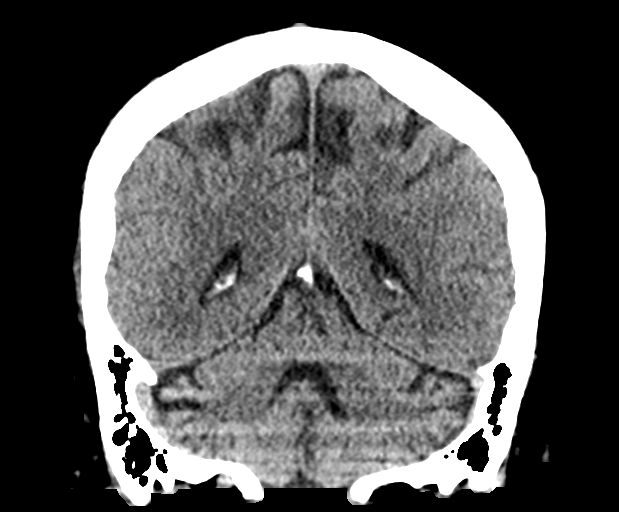
[im 29/65  brain]
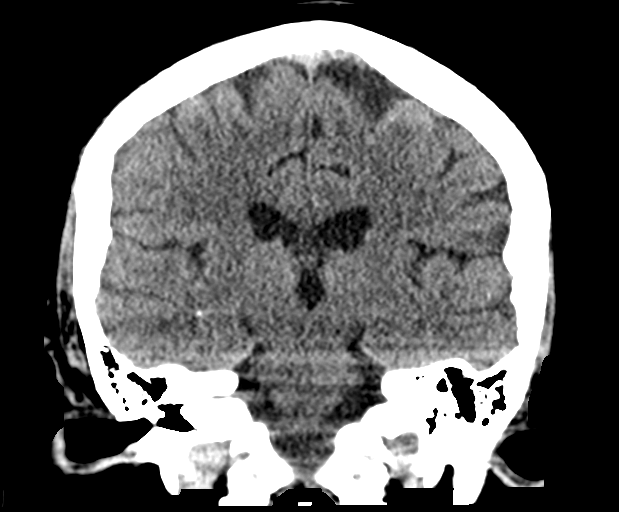
[im 36/65  brain]
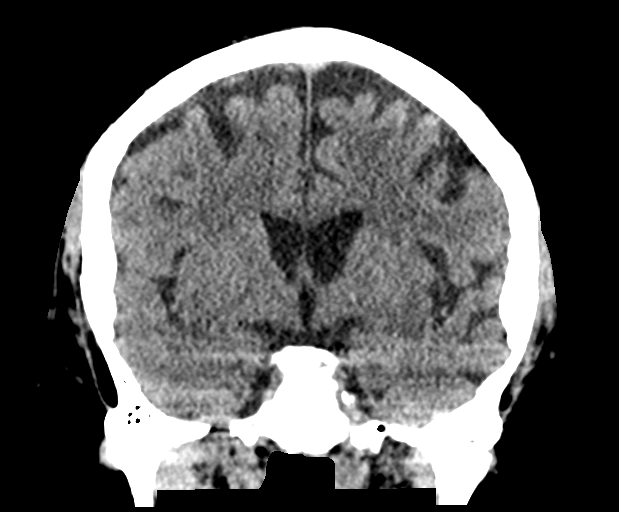

[Series 5: sagittal soft tissue · sagittal · 0.30mm/px · 3 of 59 slices shown]
[im 20/59  brain]
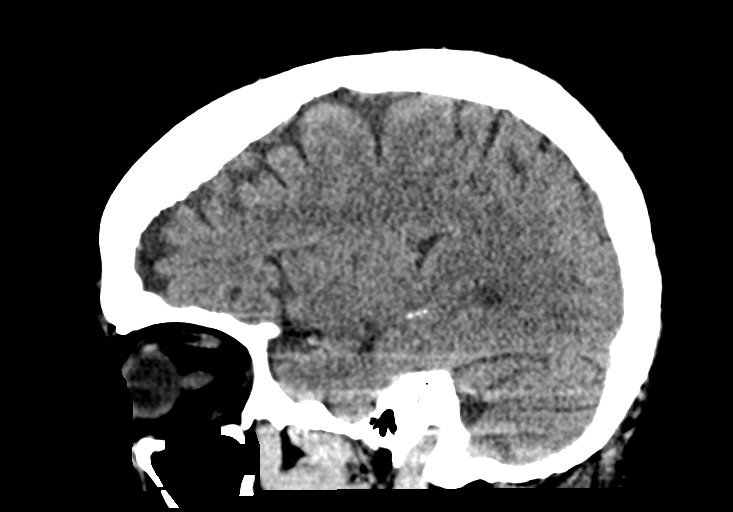
[im 30/59  brain]
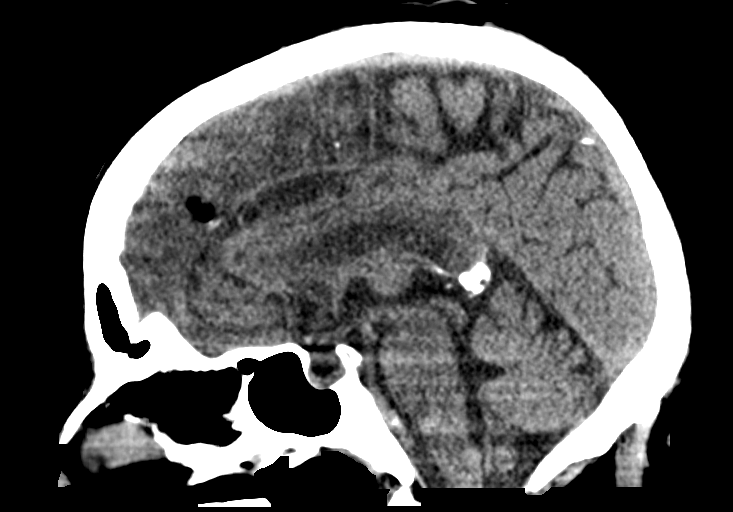
[im 39/59  brain]
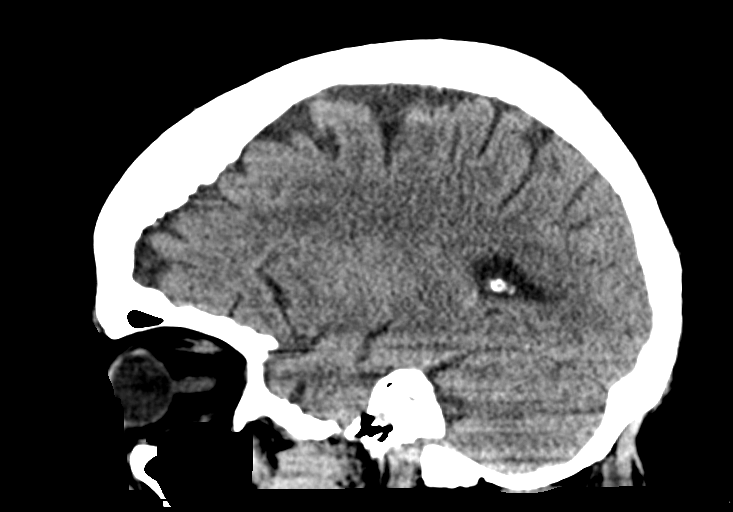

[15 of 47 positions shown; findings below may reference images not displayed]

FINDINGS: Brain: Scratched no intracranial hemorrhage, mass effect, or midline
shift. No hydrocephalus. The basilar cisterns are patent. Unchanged
remote left thalamic infarct. No evidence of territorial infarct or
acute ischemia. No extra-axial or intracranial fluid collection.

Vascular: Atherosclerosis of skullbase vasculature without
hyperdense vessel or abnormal calcification.

Skull: No fracture or focal lesion. Remote left-sided craniotomy
defect.

Sinuses/Orbits: Scattered mucosal thickening of paranasal sinuses,
unchanged. No acute findings. Bilateral cataract resection.

Other: None.
IMPRESSION: 1. No acute intracranial abnormality.
2. Unchanged remote left thalamic infarct.

## 2023-02-19 ENCOUNTER — Inpatient Hospital Stay
Admission: EM | Admit: 2023-02-19 | Discharge: 2023-02-23 | DRG: 291 | Disposition: A | Discharge status: Home Health | Payer: Medicare Other | Attending: Internal Medicine | Admitting: Internal Medicine

## 2023-02-19 ENCOUNTER — Emergency Department: Payer: Medicare Other

## 2023-02-19 ENCOUNTER — Other Ambulatory Visit: Payer: Self-pay

## 2023-02-19 DIAGNOSIS — Z8673 Personal history of transient ischemic attack (TIA), and cerebral infarction without residual deficits: Secondary | ICD-10-CM

## 2023-02-19 DIAGNOSIS — Z79899 Other long term (current) drug therapy: Secondary | ICD-10-CM

## 2023-02-19 DIAGNOSIS — K219 Gastro-esophageal reflux disease without esophagitis: Secondary | ICD-10-CM | POA: Diagnosis present

## 2023-02-19 DIAGNOSIS — Z794 Long term (current) use of insulin: Secondary | ICD-10-CM

## 2023-02-19 DIAGNOSIS — Z9049 Acquired absence of other specified parts of digestive tract: Secondary | ICD-10-CM

## 2023-02-19 DIAGNOSIS — Z7901 Long term (current) use of anticoagulants: Secondary | ICD-10-CM

## 2023-02-19 DIAGNOSIS — H1132 Conjunctival hemorrhage, left eye: Secondary | ICD-10-CM | POA: Diagnosis present

## 2023-02-19 DIAGNOSIS — J440 Chronic obstructive pulmonary disease with acute lower respiratory infection: Secondary | ICD-10-CM | POA: Diagnosis present

## 2023-02-19 DIAGNOSIS — I5032 Chronic diastolic (congestive) heart failure: Secondary | ICD-10-CM | POA: Diagnosis not present

## 2023-02-19 DIAGNOSIS — G459 Transient cerebral ischemic attack, unspecified: Secondary | ICD-10-CM | POA: Diagnosis present

## 2023-02-19 DIAGNOSIS — E1122 Type 2 diabetes mellitus with diabetic chronic kidney disease: Secondary | ICD-10-CM | POA: Diagnosis present

## 2023-02-19 DIAGNOSIS — Z882 Allergy status to sulfonamides status: Secondary | ICD-10-CM

## 2023-02-19 DIAGNOSIS — F32A Depression, unspecified: Secondary | ICD-10-CM | POA: Diagnosis present

## 2023-02-19 DIAGNOSIS — I130 Hypertensive heart and chronic kidney disease with heart failure and stage 1 through stage 4 chronic kidney disease, or unspecified chronic kidney disease: Secondary | ICD-10-CM | POA: Diagnosis not present

## 2023-02-19 DIAGNOSIS — Z7982 Long term (current) use of aspirin: Secondary | ICD-10-CM

## 2023-02-19 DIAGNOSIS — I959 Hypotension, unspecified: Secondary | ICD-10-CM | POA: Diagnosis present

## 2023-02-19 DIAGNOSIS — J9601 Acute respiratory failure with hypoxia: Secondary | ICD-10-CM

## 2023-02-19 DIAGNOSIS — R531 Weakness: Principal | ICD-10-CM

## 2023-02-19 DIAGNOSIS — Z7984 Long term (current) use of oral hypoglycemic drugs: Secondary | ICD-10-CM

## 2023-02-19 DIAGNOSIS — E1142 Type 2 diabetes mellitus with diabetic polyneuropathy: Secondary | ICD-10-CM | POA: Diagnosis present

## 2023-02-19 DIAGNOSIS — G2581 Restless legs syndrome: Secondary | ICD-10-CM | POA: Diagnosis present

## 2023-02-19 DIAGNOSIS — N179 Acute kidney failure, unspecified: Secondary | ICD-10-CM | POA: Diagnosis present

## 2023-02-19 DIAGNOSIS — E1165 Type 2 diabetes mellitus with hyperglycemia: Secondary | ICD-10-CM | POA: Diagnosis present

## 2023-02-19 DIAGNOSIS — G4733 Obstructive sleep apnea (adult) (pediatric): Secondary | ICD-10-CM | POA: Diagnosis present

## 2023-02-19 DIAGNOSIS — J209 Acute bronchitis, unspecified: Secondary | ICD-10-CM | POA: Diagnosis present

## 2023-02-19 DIAGNOSIS — N1832 Chronic kidney disease, stage 3b: Secondary | ICD-10-CM | POA: Diagnosis present

## 2023-02-19 DIAGNOSIS — Z888 Allergy status to other drugs, medicaments and biological substances status: Secondary | ICD-10-CM

## 2023-02-19 DIAGNOSIS — N189 Chronic kidney disease, unspecified: Secondary | ICD-10-CM

## 2023-02-19 DIAGNOSIS — R4701 Aphasia: Secondary | ICD-10-CM | POA: Diagnosis present

## 2023-02-19 DIAGNOSIS — Z6841 Body Mass Index (BMI) 40.0 and over, adult: Secondary | ICD-10-CM

## 2023-02-19 DIAGNOSIS — I4891 Unspecified atrial fibrillation: Secondary | ICD-10-CM | POA: Diagnosis present

## 2023-02-19 DIAGNOSIS — Z885 Allergy status to narcotic agent status: Secondary | ICD-10-CM

## 2023-02-19 DIAGNOSIS — Z825 Family history of asthma and other chronic lower respiratory diseases: Secondary | ICD-10-CM

## 2023-02-19 DIAGNOSIS — F419 Anxiety disorder, unspecified: Secondary | ICD-10-CM | POA: Diagnosis present

## 2023-02-19 DIAGNOSIS — Z981 Arthrodesis status: Secondary | ICD-10-CM

## 2023-02-19 DIAGNOSIS — Z96652 Presence of left artificial knee joint: Secondary | ICD-10-CM | POA: Diagnosis present

## 2023-02-19 DIAGNOSIS — T50995A Adverse effect of other drugs, medicaments and biological substances, initial encounter: Secondary | ICD-10-CM | POA: Diagnosis present

## 2023-02-19 DIAGNOSIS — I4892 Unspecified atrial flutter: Secondary | ICD-10-CM | POA: Diagnosis present

## 2023-02-19 DIAGNOSIS — M797 Fibromyalgia: Secondary | ICD-10-CM | POA: Diagnosis present

## 2023-02-19 DIAGNOSIS — Y92239 Unspecified place in hospital as the place of occurrence of the external cause: Secondary | ICD-10-CM | POA: Diagnosis present

## 2023-02-19 DIAGNOSIS — E785 Hyperlipidemia, unspecified: Secondary | ICD-10-CM | POA: Diagnosis present

## 2023-02-19 DIAGNOSIS — I700 Atherosclerosis of aorta: Secondary | ICD-10-CM | POA: Diagnosis present

## 2023-02-19 DIAGNOSIS — Z90710 Acquired absence of both cervix and uterus: Secondary | ICD-10-CM

## 2023-02-19 DIAGNOSIS — L304 Erythema intertrigo: Secondary | ICD-10-CM | POA: Diagnosis present

## 2023-02-19 DIAGNOSIS — E875 Hyperkalemia: Secondary | ICD-10-CM | POA: Diagnosis not present

## 2023-02-19 DIAGNOSIS — I10 Essential (primary) hypertension: Secondary | ICD-10-CM | POA: Diagnosis present

## 2023-02-19 DIAGNOSIS — J810 Acute pulmonary edema: Secondary | ICD-10-CM

## 2023-02-19 DIAGNOSIS — I5033 Acute on chronic diastolic (congestive) heart failure: Secondary | ICD-10-CM | POA: Diagnosis present

## 2023-02-19 LAB — CBC WITH DIFFERENTIAL/PLATELET
Abs Immature Granulocytes: 0.03 10*3/uL (ref 0.00–0.07)
Basophils Absolute: 0 10*3/uL (ref 0.0–0.1)
Basophils Relative: 0 %
Eosinophils Absolute: 0.3 10*3/uL (ref 0.0–0.5)
Eosinophils Relative: 4 %
HCT: 37.3 % (ref 36.0–46.0)
Hemoglobin: 11.7 g/dL — ABNORMAL LOW (ref 12.0–15.0)
Immature Granulocytes: 0 %
Lymphocytes Relative: 17 %
Lymphs Abs: 1.3 10*3/uL (ref 0.7–4.0)
MCH: 29 pg (ref 26.0–34.0)
MCHC: 31.4 g/dL (ref 30.0–36.0)
MCV: 92.6 fL (ref 80.0–100.0)
Monocytes Absolute: 0.8 10*3/uL (ref 0.1–1.0)
Monocytes Relative: 11 %
Neutro Abs: 5.1 10*3/uL (ref 1.7–7.7)
Neutrophils Relative %: 68 %
Platelets: 157 10*3/uL (ref 150–400)
RBC: 4.03 MIL/uL (ref 3.87–5.11)
RDW: 14.6 % (ref 11.5–15.5)
WBC: 7.6 10*3/uL (ref 4.0–10.5)
nRBC: 0 % (ref 0.0–0.2)

## 2023-02-19 LAB — COMPREHENSIVE METABOLIC PANEL
ALT: 15 U/L (ref 0–44)
AST: 31 U/L (ref 15–41)
Albumin: 3.1 g/dL — ABNORMAL LOW (ref 3.5–5.0)
Alkaline Phosphatase: 92 U/L (ref 38–126)
Anion gap: 7 (ref 5–15)
BUN: 50 mg/dL — ABNORMAL HIGH (ref 8–23)
CO2: 30 mmol/L (ref 22–32)
Calcium: 8.4 mg/dL — ABNORMAL LOW (ref 8.9–10.3)
Chloride: 97 mmol/L — ABNORMAL LOW (ref 98–111)
Creatinine, Ser: 2.38 mg/dL — ABNORMAL HIGH (ref 0.44–1.00)
GFR, Estimated: 20 mL/min — ABNORMAL LOW (ref >60)
Glucose, Bld: 313 mg/dL — ABNORMAL HIGH (ref 70–99)
Potassium: 5.6 mmol/L — ABNORMAL HIGH (ref 3.5–5.1)
Sodium: 134 mmol/L — ABNORMAL LOW (ref 135–145)
Total Bilirubin: 1 mg/dL (ref 0.3–1.2)
Total Protein: 6.3 g/dL — ABNORMAL LOW (ref 6.5–8.1)

## 2023-02-19 LAB — BRAIN NATRIURETIC PEPTIDE: B Natriuretic Peptide: 623 pg/mL — ABNORMAL HIGH (ref 0.0–100.0)

## 2023-02-19 LAB — CBG MONITORING, ED: Glucose-Capillary: 313 mg/dL — ABNORMAL HIGH (ref 70–99)

## 2023-02-19 LAB — D-DIMER, QUANTITATIVE: D-Dimer, Quant: 1.06 ug/mL-FEU — ABNORMAL HIGH (ref 0.00–0.50)

## 2023-02-19 LAB — LACTIC ACID, PLASMA: Lactic Acid, Venous: 1.8 mmol/L (ref 0.5–1.9)

## 2023-02-19 MED ORDER — DILTIAZEM HCL 25 MG/5ML IV SOLN
15.0000 mg | Freq: Once | INTRAVENOUS | Status: AC
  Administered 2023-02-19: 15 mg via INTRAVENOUS
  Filled 2023-02-19: qty 5

## 2023-02-19 MED ORDER — IOHEXOL 350 MG/ML SOLN
75.0000 mL | Freq: Once | INTRAVENOUS | Status: AC | PRN
  Administered 2023-02-19: 75 mL via INTRAVENOUS

## 2023-02-19 MED ORDER — SODIUM CHLORIDE 0.9 % IV BOLUS
1000.0000 mL | Freq: Once | INTRAVENOUS | Status: AC
  Administered 2023-02-19: 1000 mL via INTRAVENOUS

## 2023-02-19 MED ORDER — METHYLPREDNISOLONE SODIUM SUCC 125 MG IJ SOLR
125.0000 mg | Freq: Once | INTRAMUSCULAR | Status: AC
  Administered 2023-02-19: 125 mg via INTRAVENOUS
  Filled 2023-02-19: qty 2

## 2023-02-19 MED ORDER — IPRATROPIUM-ALBUTEROL 0.5-2.5 (3) MG/3ML IN SOLN
3.0000 mL | Freq: Once | RESPIRATORY_TRACT | Status: AC
  Administered 2023-02-19: 3 mL via RESPIRATORY_TRACT
  Filled 2023-02-19: qty 3

## 2023-02-19 NOTE — ED Notes (Signed)
Md in with pt now  family at bedside.

## 2023-02-19 NOTE — ED Notes (Signed)
Patient transported to CT 

## 2023-02-19 NOTE — ED Notes (Signed)
Placed on bedpan for urine collection. Unable to provide specimen.

## 2023-02-19 NOTE — ED Provider Notes (Signed)
   [facility] Provider Note    Event Date/Time   First MD Initiated Contact with Patient 02/19/23 2047     (approximate)   History   Weakness and Hypotension   HPI  Cheyenne Frank is a 82 y.o. female with history of A-fib, T2DM, HTN, COPD, seizures presenting to the emergency department for evaluation of weakness and shortness of breath.  For the last 24 hours, patient reports generalized weakness.  Her blood glucose has been elevated.  With EMS her heart rate was in the 140s, SBP in the 80s, BGL 333.    Physical Exam   Triage Vital Signs: ED Triage Vitals  Enc Vitals Group     BP 02/19/23 2059 134/87     Pulse Rate 02/19/23 2051 (!) 134     Resp 02/19/23 2051 16     Temp 02/19/23 2051 98.7 F (37.1 C)     Temp src --      SpO2 02/19/23 2051 96 %     Weight 02/19/23 2049 229 lb 4.5 oz (104 kg)     Height --      Head Circumference --      Peak Flow --      Pain Score --      Pain Loc --      Pain Edu? --      Excl. in GC? --     Most recent vital signs: Vitals:   02/19/23 2051 02/19/23 2059  BP:  134/87  Pulse: (!) 134   Resp: 16   Temp: 98.7 F (37.1 C)   SpO2: 96%      General: Awake, interactive  CV:  Tachycardic, normal peripheral perfusion Resp:  Diffuse expiratory wheezing, mildly labored respirations Abd:  Soft, nondistended.  Neuro:  Symmetric facial movement, fluid speech   ED Results / Procedures / Treatments   Labs (all labs ordered are listed, but only abnormal results are displayed) Labs Reviewed  CBG MONITORING, ED - Abnormal; Notable for the following components:      Result Value   Glucose-Capillary 313 (*)    All other components within normal limits  CULTURE, BLOOD (ROUTINE X 2)  CULTURE, BLOOD (ROUTINE X 2)  LACTIC ACID, PLASMA  LACTIC ACID, PLASMA  COMPREHENSIVE METABOLIC PANEL  CBC WITH DIFFERENTIAL/PLATELET  URINALYSIS, ROUTINE W REFLEX MICROSCOPIC  D-DIMER, QUANTITATIVE  BRAIN NATRIURETIC  PEPTIDE     EKG EKG independently reviewed interpreted by myself (ER attending) demonstrates:    RADIOLOGY Imaging independently reviewed and interpreted by myself demonstrates:    PROCEDURES:  Critical Care performed: {CriticalCareYesNo:19197::"Yes, see critical care procedure note(s)","No"}  Procedures   MEDICATIONS ORDERED IN ED: Medications - No data to display   IMPRESSION / MDM / ASSESSMENT AND PLAN / ED COURSE  I reviewed the triage vital signs and the nursing notes.  Differential diagnosis includes, but is not limited to, ***  Patient's presentation is most consistent with {EM COPA:27473}  ***      FINAL CLINICAL IMPRESSION(S) / ED DIAGNOSES   Final diagnoses:  None     Rx / DC Orders   ED Discharge Orders     None        Note:  This document was prepared using Dragon voice recognition software and may include unintentional dictation errors.

## 2023-02-19 NOTE — ED Triage Notes (Signed)
Pt bib EMS from home for increased weakness for past 24 hours, hypotension, HR in 140s, blood sugar 333, denies any pain. Diarrhea a few days ago. Audible wheezing, denies SOB

## 2023-02-20 ENCOUNTER — Inpatient Hospital Stay: Payer: Medicare Other

## 2023-02-20 DIAGNOSIS — I5033 Acute on chronic diastolic (congestive) heart failure: Secondary | ICD-10-CM | POA: Diagnosis present

## 2023-02-20 DIAGNOSIS — I10 Essential (primary) hypertension: Secondary | ICD-10-CM

## 2023-02-20 DIAGNOSIS — Z7901 Long term (current) use of anticoagulants: Secondary | ICD-10-CM | POA: Diagnosis not present

## 2023-02-20 DIAGNOSIS — Z6841 Body Mass Index (BMI) 40.0 and over, adult: Secondary | ICD-10-CM | POA: Diagnosis not present

## 2023-02-20 DIAGNOSIS — R4701 Aphasia: Secondary | ICD-10-CM | POA: Diagnosis present

## 2023-02-20 DIAGNOSIS — I130 Hypertensive heart and chronic kidney disease with heart failure and stage 1 through stage 4 chronic kidney disease, or unspecified chronic kidney disease: Secondary | ICD-10-CM | POA: Diagnosis present

## 2023-02-20 DIAGNOSIS — N189 Chronic kidney disease, unspecified: Secondary | ICD-10-CM

## 2023-02-20 DIAGNOSIS — I5032 Chronic diastolic (congestive) heart failure: Secondary | ICD-10-CM | POA: Diagnosis present

## 2023-02-20 DIAGNOSIS — G459 Transient cerebral ischemic attack, unspecified: Secondary | ICD-10-CM | POA: Diagnosis present

## 2023-02-20 DIAGNOSIS — Z7984 Long term (current) use of oral hypoglycemic drugs: Secondary | ICD-10-CM | POA: Diagnosis not present

## 2023-02-20 DIAGNOSIS — I4892 Unspecified atrial flutter: Secondary | ICD-10-CM

## 2023-02-20 DIAGNOSIS — J440 Chronic obstructive pulmonary disease with acute lower respiratory infection: Secondary | ICD-10-CM | POA: Diagnosis present

## 2023-02-20 DIAGNOSIS — I700 Atherosclerosis of aorta: Secondary | ICD-10-CM | POA: Diagnosis present

## 2023-02-20 DIAGNOSIS — E785 Hyperlipidemia, unspecified: Secondary | ICD-10-CM

## 2023-02-20 DIAGNOSIS — M797 Fibromyalgia: Secondary | ICD-10-CM | POA: Diagnosis present

## 2023-02-20 DIAGNOSIS — I4891 Unspecified atrial fibrillation: Secondary | ICD-10-CM | POA: Diagnosis present

## 2023-02-20 DIAGNOSIS — F32A Depression, unspecified: Secondary | ICD-10-CM | POA: Diagnosis present

## 2023-02-20 DIAGNOSIS — E1142 Type 2 diabetes mellitus with diabetic polyneuropathy: Secondary | ICD-10-CM

## 2023-02-20 DIAGNOSIS — N1832 Chronic kidney disease, stage 3b: Secondary | ICD-10-CM | POA: Diagnosis present

## 2023-02-20 DIAGNOSIS — N179 Acute kidney failure, unspecified: Secondary | ICD-10-CM

## 2023-02-20 DIAGNOSIS — Z794 Long term (current) use of insulin: Secondary | ICD-10-CM | POA: Diagnosis not present

## 2023-02-20 DIAGNOSIS — E1165 Type 2 diabetes mellitus with hyperglycemia: Secondary | ICD-10-CM | POA: Diagnosis present

## 2023-02-20 DIAGNOSIS — E1122 Type 2 diabetes mellitus with diabetic chronic kidney disease: Secondary | ICD-10-CM | POA: Diagnosis present

## 2023-02-20 DIAGNOSIS — I959 Hypotension, unspecified: Secondary | ICD-10-CM | POA: Diagnosis present

## 2023-02-20 DIAGNOSIS — G2581 Restless legs syndrome: Secondary | ICD-10-CM | POA: Diagnosis present

## 2023-02-20 DIAGNOSIS — E875 Hyperkalemia: Secondary | ICD-10-CM | POA: Diagnosis not present

## 2023-02-20 DIAGNOSIS — Y92239 Unspecified place in hospital as the place of occurrence of the external cause: Secondary | ICD-10-CM | POA: Diagnosis present

## 2023-02-20 LAB — URINALYSIS, ROUTINE W REFLEX MICROSCOPIC
Bacteria, UA: NONE SEEN
Bilirubin Urine: NEGATIVE
Glucose, UA: NEGATIVE mg/dL
Hgb urine dipstick: NEGATIVE
Ketones, ur: NEGATIVE mg/dL
Leukocytes,Ua: NEGATIVE
Nitrite: NEGATIVE
Protein, ur: 100 mg/dL — AB
Specific Gravity, Urine: 1.033 — ABNORMAL HIGH (ref 1.005–1.030)
pH: 5 (ref 5.0–8.0)

## 2023-02-20 LAB — CBC
HCT: 36.7 % (ref 36.0–46.0)
Hemoglobin: 11.5 g/dL — ABNORMAL LOW (ref 12.0–15.0)
MCH: 28.9 pg (ref 26.0–34.0)
MCHC: 31.3 g/dL (ref 30.0–36.0)
MCV: 92.2 fL (ref 80.0–100.0)
Platelets: 141 10*3/uL — ABNORMAL LOW (ref 150–400)
RBC: 3.98 MIL/uL (ref 3.87–5.11)
RDW: 14.3 % (ref 11.5–15.5)
WBC: 7.3 10*3/uL (ref 4.0–10.5)
nRBC: 0 % (ref 0.0–0.2)

## 2023-02-20 LAB — HEMOGLOBIN A1C
Hgb A1c MFr Bld: 10.2 % — ABNORMAL HIGH (ref 4.8–5.6)
Mean Plasma Glucose: 246 mg/dL

## 2023-02-20 LAB — POTASSIUM
Potassium: 4.9 mmol/L (ref 3.5–5.1)
Potassium: 5 mmol/L (ref 3.5–5.1)
Potassium: 5 mmol/L (ref 3.5–5.1)

## 2023-02-20 LAB — BASIC METABOLIC PANEL
Anion gap: 9 (ref 5–15)
BUN: 55 mg/dL — ABNORMAL HIGH (ref 8–23)
CO2: 29 mmol/L (ref 22–32)
Calcium: 8.5 mg/dL — ABNORMAL LOW (ref 8.9–10.3)
Chloride: 99 mmol/L (ref 98–111)
Creatinine, Ser: 2.26 mg/dL — ABNORMAL HIGH (ref 0.44–1.00)
GFR, Estimated: 21 mL/min — ABNORMAL LOW (ref >60)
Glucose, Bld: 346 mg/dL — ABNORMAL HIGH (ref 70–99)
Potassium: 5.8 mmol/L — ABNORMAL HIGH (ref 3.5–5.1)
Sodium: 137 mmol/L (ref 135–145)

## 2023-02-20 LAB — LACTIC ACID, PLASMA: Lactic Acid, Venous: 2 mmol/L (ref 0.5–1.9)

## 2023-02-20 LAB — GLUCOSE, CAPILLARY
Glucose-Capillary: 459 mg/dL — ABNORMAL HIGH (ref 70–99)
Glucose-Capillary: 434 mg/dL — ABNORMAL HIGH (ref 70–99)

## 2023-02-20 LAB — CBG MONITORING, ED
Glucose-Capillary: 303 mg/dL — ABNORMAL HIGH (ref 70–99)
Glucose-Capillary: 359 mg/dL — ABNORMAL HIGH (ref 70–99)

## 2023-02-20 MED ORDER — INSULIN ASPART 100 UNIT/ML IJ SOLN: 0.0000 [IU] | Freq: Three times a day (TID) | INTRAMUSCULAR | Status: DC

## 2023-02-20 MED ORDER — PANTOPRAZOLE SODIUM 40 MG PO TBEC
40.0000 mg | DELAYED_RELEASE_TABLET | Freq: Every day | ORAL | Status: DC
  Administered 2023-02-20 – 2023-02-23 (×4): 40 mg via ORAL
  Filled 2023-02-20 (×4): qty 1

## 2023-02-20 MED ORDER — INSULIN ASPART 100 UNIT/ML IJ SOLN
0.0000 [IU] | Freq: Three times a day (TID) | INTRAMUSCULAR | Status: DC
  Administered 2023-02-20: 15 [IU] via SUBCUTANEOUS
  Administered 2023-02-20: 11 [IU] via SUBCUTANEOUS
  Administered 2023-02-21: 8 [IU] via SUBCUTANEOUS
  Filled 2023-02-20 (×3): qty 1

## 2023-02-20 MED ORDER — OXYBUTYNIN CHLORIDE ER 10 MG PO TB24
10.0000 mg | ORAL_TABLET | Freq: Every day | ORAL | Status: DC
  Administered 2023-02-20 – 2023-02-22 (×3): 10 mg via ORAL
  Filled 2023-02-20 (×4): qty 1

## 2023-02-20 MED ORDER — DILTIAZEM HCL-DEXTROSE 125-5 MG/125ML-% IV SOLN (PREMIX)
5.0000 mg/h | INTRAVENOUS | Status: DC
  Administered 2023-02-20: 5 mg/h via INTRAVENOUS
  Administered 2023-02-20: 15 mg/h via INTRAVENOUS
  Administered 2023-02-20: 10 mg/h via INTRAVENOUS
  Filled 2023-02-20 (×2): qty 125

## 2023-02-20 MED ORDER — HYDRALAZINE HCL 25 MG PO TABS: 25.0000 mg | ORAL_TABLET | Freq: Three times a day (TID) | ORAL | Status: DC | PRN

## 2023-02-20 MED ORDER — SODIUM ZIRCONIUM CYCLOSILICATE 10 G PO PACK
10.0000 g | PACK | Freq: Every day | ORAL | Status: DC
  Administered 2023-02-20: 10 g via ORAL
  Filled 2023-02-20 (×2): qty 1

## 2023-02-20 MED ORDER — APIXABAN 5 MG PO TABS: 5.0000 mg | ORAL_TABLET | Freq: Two times a day (BID) | ORAL | Status: DC

## 2023-02-20 MED ORDER — GABAPENTIN 300 MG PO CAPS
300.0000 mg | ORAL_CAPSULE | Freq: Two times a day (BID) | ORAL | Status: DC
  Administered 2023-02-20 – 2023-02-23 (×7): 300 mg via ORAL
  Filled 2023-02-20 (×7): qty 1

## 2023-02-20 MED ORDER — ACETAMINOPHEN 325 MG PO TABS: 650.0000 mg | ORAL_TABLET | Freq: Four times a day (QID) | ORAL | Status: DC | PRN

## 2023-02-20 MED ORDER — CARVEDILOL 25 MG PO TABS
25.0000 mg | ORAL_TABLET | Freq: Two times a day (BID) | ORAL | Status: DC
  Administered 2023-02-20 (×2): 25 mg via ORAL
  Filled 2023-02-20 (×4): qty 1

## 2023-02-20 MED ORDER — LINAGLIPTIN 5 MG PO TABS
5.0000 mg | ORAL_TABLET | Freq: Every day | ORAL | Status: DC
  Administered 2023-02-20 – 2023-02-23 (×4): 5 mg via ORAL
  Filled 2023-02-20 (×4): qty 1

## 2023-02-20 MED ORDER — DILTIAZEM HCL 30 MG PO TABS
30.0000 mg | ORAL_TABLET | Freq: Four times a day (QID) | ORAL | Status: DC
  Administered 2023-02-20 – 2023-02-21 (×3): 30 mg via ORAL
  Filled 2023-02-20 (×3): qty 1

## 2023-02-20 MED ORDER — ASPIRIN 81 MG PO TBEC
81.0000 mg | DELAYED_RELEASE_TABLET | Freq: Every day | ORAL | Status: DC
  Administered 2023-02-20 – 2023-02-23 (×4): 81 mg via ORAL
  Filled 2023-02-20 (×4): qty 1

## 2023-02-20 MED ORDER — ONDANSETRON HCL 4 MG PO TABS: 4.0000 mg | ORAL_TABLET | Freq: Four times a day (QID) | ORAL | Status: DC | PRN

## 2023-02-20 MED ORDER — TRAZODONE HCL 50 MG PO TABS: 25.0000 mg | ORAL_TABLET | Freq: Every evening | ORAL | Status: DC | PRN

## 2023-02-20 MED ORDER — SODIUM CHLORIDE 0.9 % IV SOLN: INTRAVENOUS | Status: DC

## 2023-02-20 MED ORDER — TRAZODONE HCL 50 MG PO TABS
75.0000 mg | ORAL_TABLET | Freq: Every day | ORAL | Status: DC
  Administered 2023-02-20 – 2023-02-22 (×3): 75 mg via ORAL
  Filled 2023-02-20 (×3): qty 2

## 2023-02-20 MED ORDER — ALBUTEROL SULFATE (2.5 MG/3ML) 0.083% IN NEBU
2.5000 mg | INHALATION_SOLUTION | Freq: Four times a day (QID) | RESPIRATORY_TRACT | Status: DC | PRN
  Administered 2023-02-21 – 2023-02-23 (×3): 2.5 mg via RESPIRATORY_TRACT
  Filled 2023-02-20 (×3): qty 3

## 2023-02-20 MED ORDER — ADULT MULTIVITAMIN W/MINERALS CH
1.0000 | ORAL_TABLET | Freq: Every day | ORAL | Status: DC
  Administered 2023-02-20 – 2023-02-23 (×4): 1 via ORAL
  Filled 2023-02-20 (×4): qty 1

## 2023-02-20 MED ORDER — ACETAMINOPHEN 650 MG RE SUPP: 650.0000 mg | Freq: Four times a day (QID) | RECTAL | Status: DC | PRN

## 2023-02-20 MED ORDER — KETOCONAZOLE 2 % EX CREA
TOPICAL_CREAM | Freq: Two times a day (BID) | CUTANEOUS | Status: DC
  Filled 2023-02-20 (×2): qty 15

## 2023-02-20 MED ORDER — PAROXETINE HCL 20 MG PO TABS
40.0000 mg | ORAL_TABLET | Freq: Every day | ORAL | Status: DC
  Administered 2023-02-20 – 2023-02-22 (×3): 40 mg via ORAL
  Filled 2023-02-20 (×4): qty 2

## 2023-02-20 MED ORDER — ONDANSETRON HCL 4 MG/2ML IJ SOLN: 4.0000 mg | Freq: Four times a day (QID) | INTRAMUSCULAR | Status: DC | PRN

## 2023-02-20 MED ORDER — INSULIN ASPART 100 UNIT/ML IJ SOLN
0.0000 [IU] | INTRAMUSCULAR | Status: DC
  Administered 2023-02-20: 11 [IU] via SUBCUTANEOUS
  Filled 2023-02-20: qty 1

## 2023-02-20 MED ORDER — FUROSEMIDE 10 MG/ML IJ SOLN
40.0000 mg | Freq: Two times a day (BID) | INTRAMUSCULAR | Status: DC
  Administered 2023-02-20: 40 mg via INTRAVENOUS
  Filled 2023-02-20 (×2): qty 4

## 2023-02-20 MED ORDER — ROSUVASTATIN CALCIUM 5 MG PO TABS
2.5000 mg | ORAL_TABLET | Freq: Every day | ORAL | Status: DC
  Administered 2023-02-20 – 2023-02-22 (×3): 2.5 mg via ORAL
  Filled 2023-02-20 (×3): qty 1

## 2023-02-20 MED ORDER — INSULIN GLARGINE-YFGN 100 UNIT/ML ~~LOC~~ SOLN
64.0000 [IU] | Freq: Every day | SUBCUTANEOUS | Status: DC
  Administered 2023-02-20 – 2023-02-21 (×3): 64 [IU] via SUBCUTANEOUS
  Filled 2023-02-20 (×3): qty 0.64

## 2023-02-20 MED ORDER — FUROSEMIDE 10 MG/ML IJ SOLN
40.0000 mg | Freq: Every day | INTRAMUSCULAR | Status: DC
  Administered 2023-02-21: 40 mg via INTRAVENOUS
  Filled 2023-02-20: qty 4

## 2023-02-20 MED ORDER — INSULIN ASPART 100 UNIT/ML IJ SOLN: 0.0000 [IU] | Freq: Every day | INTRAMUSCULAR | Status: DC

## 2023-02-20 MED ORDER — APIXABAN 2.5 MG PO TABS
2.5000 mg | ORAL_TABLET | Freq: Two times a day (BID) | ORAL | Status: DC
  Administered 2023-02-20 – 2023-02-23 (×7): 2.5 mg via ORAL
  Filled 2023-02-20 (×7): qty 1

## 2023-02-20 MED ORDER — MAGNESIUM HYDROXIDE 400 MG/5ML PO SUSP: 30.0000 mL | Freq: Every day | ORAL | Status: DC | PRN

## 2023-02-20 NOTE — Assessment & Plan Note (Signed)
-   We will continue IV Cardizem drip for now. - We will continue Eliquis.

## 2023-02-20 NOTE — ED Notes (Signed)
Patient transported to MRI 

## 2023-02-20 NOTE — ED Notes (Signed)
Daughter at bedside and said "someone had asked about mom's baseline. She is normally alert and oriented, uses a wheelchair and is able to stand and pivot into her chair. Sometimes she has issues finding her words when she gets agitated or flustered." Pt woke while daughter was talking to this RN and some of pt's words were garbled which daughter said was not her normal.

## 2023-02-20 NOTE — Assessment & Plan Note (Signed)
-   We will continue Paxil and trazodone.

## 2023-02-20 NOTE — Progress Notes (Signed)
The patient was seen and examined for acute onsets of worsening slurred speech and decreased responsiveness and inability to answer questions per her nurse.  Code stroke was called.  During my exam, she was somnolent but arousable and was having mild worsening of her slurred speech with no other neurological deficits.  She was slow to respond to questions.  Teleneurology consult was being started and stat head CT scan was ordered.  We will continue to monitor her.

## 2023-02-20 NOTE — ED Notes (Signed)
Report off to abby rn

## 2023-02-20 NOTE — Assessment & Plan Note (Signed)
-  The patient will be admitted to a cardiac telemetry bed. - We will continue diuresis with IV Lasix. - We Will follow serial troponins. - We will follow I's and O's and daily weights. - Cardiology consult be obtained. - I notified Dr. Shirlee Latch about the patient.

## 2023-02-20 NOTE — Assessment & Plan Note (Signed)
-   We will continue her antihypertensives. 

## 2023-02-20 NOTE — Consult Note (Signed)
[facility] Consult Services   Patient Name:   Cheyenne Frank, Cheyenne Frank Date of Birth:   Sep 30, 1940 Identification Number:   MRN - [id number] Date of Service:   02/20/2023 06:56:48  Diagnosis:       I63.89 - Cerebrovascular accident (CVA) due to other mechanism ([facility])       G93.41 - Encephalopathy Metabolic  Impression:  Pt is a 82 YOF with PMH of HTN, HLD, GERD, ASTHMA, DM II, OSA, RLS, ANXIETY D/O, A.FIB on ELIQUIS, prior CVA who had slurred speech, AMS. NIHSS: 2. She is not a thrombolytic candidate. Deferred CTAs due to poor renal function and not fitting LVO pattern. Complete stroke workup.    Monitor neuro checks/VS q4h with telemetry.  Recommend fall precautions and seizure precautions.  Goal SBP b/w 160-180 today, 140-160 -> 100-140 afterwards.  Can cont. ELIQUIS if no acute stroke on MRI (consider holding depending on size).  Start STATIN if no contraindications.  Get MRI BRAIN W/O and ECHO.  Get AMMONIA, BLOOD CX x2, COVID, UDS, ETOH, ESR/CRP, TROP, CK, TSH, B12, BNP, LACTIC ACID, LIPID PANEL, and A1C.  Get WORKUP for TOXIC/METABOLIC/INFECTIOUS causes.  PT/OT/ST eval.    Our recommendations are outlined below.  Recommendations:        Stroke/Telemetry Floor       Neuro Checks       Bedside Swallow Eval       DVT Prophylaxis       IV Fluids, Normal Saline       Head of Bed 30 Degrees       Euglycemia and Avoid Hyperthermia (PRN Acetaminophen)  Sign Out:       Discussed with Primary Attending    ------------------------------------------------------------------------------  Advanced Imaging: Advanced Imaging Deferred because:  poor renal function and not fitting LVO pattern.   Metrics: Last Known Well: Unknown [facility] Notification Time: 02/20/2023 06:56:48 Stamp Time: 02/20/2023 06:56:48 Initial Response Time: 02/20/2023 07:01:38 Symptoms: slurred speech, AMS. Initial patient interaction: 02/20/2023 07:06:06 NIHSS  Assessment Completed: 02/20/2023 07:20:59 Patient is not a candidate for Thrombolytic. Thrombolytic Medical Decision: 02/20/2023 07:21:20 Patient was not deemed candidate for Thrombolytic because of following reasons: Last Well Known Above 4.5 Hours. Use of NOAs within 48 hours.  CT HEAD: Chronic infarcts primarily in the posterior circulation. No acute or focal finding.   Primary Provider Notified of Diagnostic Impression and Management Plan on: 02/20/2023 07:31:24 Spoke With: INPT TEAM (no call back #) Able to Reach Attempted to reach the consulting physician but was unsuccessful    ------------------------------------------------------------------------------  History of Present Illness: Inpatient stroke alert was called for symptoms of slurred speech, AMS.  Pt is a 82 YOF with PMH of HTN, HLD, GERD, ASTHMA, DM II, OSA, RLS, ANXIETY D/O, A.FIB on ELIQUIS, prior CVA who had slurred speech, AMS. Her last known well not clear. She is confused and difficult to get info from her. She is on ELIQUIS but last dose not clear. She had recent left eye surgery. Unclear if speech is her baseline or not.  -BUN/CR: 50/2.38, Ca: 8.4, Na: 134, K: 5.6, LACTATE: 2.0, BNP: 623, D-DIMER: 1.06   Past Medical History:      Hypertension      Diabetes Mellitus      Hyperlipidemia      Atrial Fibrillation      Stroke unable to obtain due to:   Patient Is Confused  Medications:  Anticoagulant use:  Yes ELIQUIS No Antiplatelet use Reviewed EMR for current medications  Allergies:  Allergies Unable To Obtain Due To: Patient Is Confused  Social History: Unable To Obtain Due To Patient Status : Patient Is Confused  Family History:  Family History Cannot Be Obtained Because:Patient Is Confused  ROS : ROS Cannot Be Obtained Because:  Patient Is Confused  Past Surgical History: Past Surgical History Cannot Be Obtained Because: Patient Is Confused    Examination: BP(142/68), Pulse(57),  Blood Glucose(303) 1A: Level of Consciousness - Alert; keenly responsive + 0 1B: Ask Month and Age - 1 Question Right + 1 1C: Blink Eyes & Squeeze Hands - Performs Both Tasks + 0 2: Test Horizontal Extraocular Movements - Normal + 0 3: Test Visual Fields - No Visual Loss + 0 4: Test Facial Palsy (Use Grimace if Obtunded) - Normal symmetry + 0 5A: Test Left Arm Motor Drift - No Drift for 10 Seconds + 0 5B: Test Right Arm Motor Drift - No Drift for 10 Seconds + 0 6A: Test Left Leg Motor Drift - No Drift for 5 Seconds + 0 6B: Test Right Leg Motor Drift - No Drift for 5 Seconds + 0 7: Test Limb Ataxia (FNF/Heel-Shin) - No Ataxia + 0 8: Test Sensation - Normal; No sensory loss + 0 9: Test Language/Aphasia - Normal; No aphasia + 0 10: Test Dysarthria - Mild-Moderate Dysarthria: Slurring but can be understood + 1 11: Test Extinction/Inattention - No abnormality + 0  NIHSS Score: 2  NIHSS Free Text : Age: 82, Month/Year: ? She has some slurred speech, no facial droop.  Pre-Morbid Modified Rankin Scale: Unable to assess  Spoke with : INPT TEAM (no call back #)  This consult was conducted in real time using interactive audio and Immunologist. Patient was informed of the technology being used for this visit and agreed to proceed. Patient located in hospital and provider located at home/office setting.   Patient is being evaluated for possible acute neurologic impairment and high probability of imminent or life-threatening deterioration. I spent total of 30 minutes providing care to this patient, including time for face to face visit via telemedicine, review of medical records, imaging studies and discussion of findings with providers, the patient and/or family.   Dr Marcene Corning   [facility] For Inpatient follow-up with [facility] physician please call [facility] [phone]. This is not an outpatient service. Post hospital discharge, please contact hospital directly.  Please  do not communicate with [facility] physicians via secure chat. If you have any questions, Please contact [facility]. Please call or reconsult our service if there are any clinical or diagnostic changes.

## 2023-02-20 NOTE — Progress Notes (Signed)
1610: Tele stroke cart activated at this time. Pt is in the ED,but is inpatient. LKW is unknown. ROS is 0630. Pt with slurred speech and difficulty with word finding.  9604: Provider at bedside.  5409: Pt to CT.  8119: Report given to Alcide Goodness, RN. This TSRN off camera at this time.

## 2023-02-20 NOTE — Progress Notes (Signed)
Patient admitted to the hospital for acute on chronic diastolic dysfunction CHF and rapid A-fib.  Code stroke called for intermittent episodes of slurred speech.  Initial CT scan of the head without contrast was negative for bleed.  MRI showed remote infarcts but nothing acute.  Will resume patient's Eliquis.  Will titrate Cardizem drip to DC. Noted to have hyperkalemia and expect improvement since she received Lasix and Lokelma.  Follow-up results of repeat potassium level

## 2023-02-20 NOTE — Progress Notes (Signed)
Patient blood glucose >400 due to patient eating prior to accucheck. Dr. Joylene Igo notified via secure check per order.

## 2023-02-20 NOTE — Assessment & Plan Note (Signed)
-   We will continue statin therapy. 

## 2023-02-20 NOTE — Consult Note (Signed)
[facility] CLINIC CARDIOLOGY CONSULT NOTE       Patient ID: Cheyenne Frank MRN: [id number] DOB/AGE: [birth year] 82 y.o.  Admit date: 02/19/2023 Referring Physician Dr. Valente David Primary Physician Dr. Laurine Blazer  Primary Cardiologist Dr. Darrold Junker (last seen 2022)  Reason for Consultation AF RVR  HPI: Cheyenne Frank is an 71yoF with a PMH of , paroxymal atrial flutter (eliquis), DM2, HTN, CKD, hx CVA, morbid obesity (BMI 45 kg/m), memory impairment who presented to [facility] ED 02/19/2023 with generalized weakness and palpitations.  Found to be in atrial fibrillation with RVR and hypotensive to the 80s per EMS.  Cardiology is consulted for further assistance with her heart failure.  The patient is a very limited historian and presents with her daytime caretaker at the bedside who helps contribute to the history.  The patient lives at home with her 3 children and has caretakers 24/7 that rotate and day and night shifts.  Her caretaker states that the patient has had about a month of cough and wheezing and reportedly felt very weak yesterday which prompted emergency assistance.  When EMS arrived she was reportedly hypotensive with SBP in the 80s, and heart rate was in the 140s when they arrived.  EKG showed a flutter with RVR with rate in the 130s.  She was given a liter bolus of normal saline, IV Solu-Medrol, and was started on a Cardizem infusion for control of her atrial fibrillation with RVR.  This morning with the patient's daughter at bedside, there was some concern regarding increased garbled speech compared to the patient's intermittent speech issues at baseline from her previous CVA.  Code stroke was activated stat CT was obtained which was negative for acute infarct and demonstrated chronic infarcts.  Neurology evaluated the patient who recommended holding Eliquis until stroke was ruled out by MRI.  MRI also was negative for acute infarct fortunately.  At my time of evaluation this afternoon, the  patient is laying at low incline in ED stretcher with her caretaker present.  She denies chest pain or shortness of breath, heart racing or palpitations.  She currently is alert and oriented to self but states the year is 2025, the month is April and that "Jackquline Bosch is not president but he is running for president."  She does not give me a straight answer when asked why she is here in the hospital and instead starts trying to pull off her pulse oximeter.  This seems to be her baseline per the patient's caretaker.  Review of systems complete and found to be negative unless listed above     Past Medical History:  Diagnosis Date   AKI (acute kidney injury) (HCC)    Anemia of chronic disease    Anxiety    Arthritis    Asthma    no problems since 1999 (after moving into a new home)   Chronic airway obstruction (HCC)    Complication of anesthesia    " feel like I'm dying after I wake up"   Diabetes mellitus without complication (HCC)    Diverticulosis    Dysrhythmia    unknown type    Fibromyalgia    Flat back syndrome    GERD (gastroesophageal reflux disease)    Hx of transfusion 2011   Hypertension    Itching    Macular degeneration    Nocturia    Postoperative anemia due to acute blood loss 04/09/2013   Restless leg syndrome    Sleep apnea    Dx  1990's unable to wear c-pap -   Stroke (HCC) 2010   verbal aphasia x 30 min - resolved - no problem since then    Past Surgical History:  Procedure Laterality Date   ABDOMINAL HYSTERECTOMY     rt so   BACK SURGERY  2010 / 2011   BREAST REDUCTION SURGERY     BURR HOLE FOR SUBDURAL HEMATOMA  2015   cataracts removed     CHOLECYSTECTOMY  1990   DILATION AND CURETTAGE OF UTERUS     HERNIA REPAIR     POSTERIOR LAMINECTOMY / DECOMPRESSION LUMBAR SPINE     with T10-S1 fusion   TONSILLECTOMY     TOTAL KNEE ARTHROPLASTY Left 04/06/2013   Procedure: LEFT TOTAL KNEE ARTHROPLASTY;  Surgeon: Loanne Drilling, MD;  Location: [facility] ORS;  Service:  Orthopedics;  Laterality: Left;    Medications Prior to Admission  Medication Sig Dispense Refill Last Dose   acetaminophen (TYLENOL) 500 MG tablet Take 500-1,000 mg by mouth every 6 (six) hours as needed for mild pain or fever.    unk at unk   albuterol (VENTOLIN HFA) 108 (90 Base) MCG/ACT inhaler Inhale 2 puffs into the lungs every 6 (six) hours as needed for wheezing or shortness of breath.   unk at unk   apixaban (ELIQUIS) 5 MG TABS tablet Take 1 tablet (5 mg total) by mouth 2 (two) times daily. 60 tablet 0 02/19/2023   aspirin EC 81 MG tablet Take 1 tablet (81 mg total) by mouth daily. MAY RESUME ON 12/31/20 (Patient taking differently: Take 81 mg by mouth daily.) 30 tablet 11 02/19/2023   carvedilol (COREG) 25 MG tablet Take 25 mg by mouth 2 (two) times daily.    02/19/2023   diltiazem (CARDIZEM CD) 120 MG 24 hr capsule Take 1 capsule (120 mg total) by mouth daily. 30 capsule 11 02/19/2023   gabapentin (NEURONTIN) 300 MG capsule Take 1 capsule (300 mg total) by mouth 2 (two) times daily. 6 capsule 0 02/19/2023   hydrALAZINE (APRESOLINE) 25 MG tablet Take 1 tablet (25 mg total) by mouth 3 (three) times daily as needed (For systolic above 160). 30 tablet 0 unk at unk   insulin glargine (LANTUS) 100 UNIT/ML injection Inject 0.64 mLs (64 Units total) into the skin at bedtime.   02/18/2023   insulin lispro (HUMALOG) 100 UNIT/ML injection Inject 0.1 mLs (10 Units total) into the skin 3 (three) times daily with meals.   02/19/2023   losartan (COZAAR) 25 MG tablet Take 25 mg by mouth daily.   02/19/2023   Multiple Vitamin (MULTIVITAMIN WITH MINERALS) TABS tablet Take 1 tablet by mouth daily.   02/19/2023   Multiple Vitamins-Minerals (ICAPS AREDS 2 PO) Take 1 capsule by mouth in the morning and at bedtime.   02/19/2023   omeprazole (PRILOSEC) 40 MG capsule Take 40 mg by mouth daily.    02/19/2023   oxybutynin (DITROPAN-XL) 10 MG 24 hr tablet Take 10 mg by mouth at bedtime.   02/19/2023   PARoxetine (PAXIL) 40 MG tablet Take  1 tablet (40 mg total) by mouth at bedtime. 6 tablet 0 02/19/2023   rosuvastatin (CRESTOR) 5 MG tablet Take 2.5 mg by mouth at bedtime.   02/19/2023   sitaGLIPtin (JANUVIA) 50 MG tablet Take 50 mg by mouth daily.   02/19/2023   torsemide (DEMADEX) 20 MG tablet Take 1 tablet (20 mg total) by mouth daily. Please start from 11/01/2021 90 tablet 1 02/19/2023   traZODone (DESYREL) 50  MG tablet Take 1.5 tablets (75 mg total) by mouth at bedtime.   02/19/2023   Social History   Socioeconomic History   Marital status: Single    Spouse name: Not on file   Number of children: Not on file   Years of education: Not on file   Highest education level: Not on file  Occupational History   Not on file  Tobacco Use   Smoking status: Never   Smokeless tobacco: Never  Substance and Sexual Activity   Alcohol use: No    Alcohol/week: 0.0 standard drinks of alcohol   Drug use: No   Sexual activity: Not Currently  Other Topics Concern   Not on file  Social History Narrative   Not on file   Social Determinants of Health   Financial Resource Strain: Not on file  Food Insecurity: No Food Insecurity (11/16/2022)   Hunger Vital Sign    Worried About Running Out of Food in the Last Year: Never true    Ran Out of Food in the Last Year: Never true  Transportation Needs: No Transportation Needs (11/16/2022)   PRAPARE - Administrator, Civil Service (Medical): No    Lack of Transportation (Non-Medical): No  Physical Activity: Not on file  Stress: Not on file  Social Connections: Not on file  Intimate Partner Violence: Not At Risk (11/16/2022)   Humiliation, Afraid, Rape, and Kick questionnaire    Fear of Current or Ex-Partner: No    Emotionally Abused: No    Physically Abused: No    Sexually Abused: No    Family History  Problem Relation Age of Onset   COPD Sister        had lung transplant   COPD Brother       Intake/Output Summary (Last 24 hours) at 02/20/2023 1514 Last data filed at 02/20/2023  1610 Gross per 24 hour  Intake 70.15 ml  Output --  Net 70.15 ml    Vitals:   02/20/23 1000 02/20/23 1100 02/20/23 1250 02/20/23 1422  BP: (!) 134/98 (!) 118/98 116/88 125/74  Pulse: (!) 124 (!) 129    Resp: 14 20  20   Temp:    97.8 F (36.6 C)  TempSrc:    Oral  SpO2: 99% 100%  93%  Weight:    114.2 kg  Height:    5' 2.5" (1.588 m)    PHYSICAL EXAM General: Elderly female, well nourished, in no acute distress.  Laying at low incline in ED stretcher with caretaker present at bedside HEENT:  Normocephalic and atraumatic. Neck:  No JVD.  Lungs: Slight increased work of breathing on room air.  Decreased breath sounds with expiratory wheezes.   Heart: Irregularly irregular with controlled rate. Normal S1 and S2 without gallops or murmurs.  Abdomen: Distended appearing with excess adiposity, nontender to palpation.  Redness and erythema with whitish substance over lower abdominal folds bilaterally Msk: Normal strength and tone for age. Extremities: Warm and well perfused. No clubbing, cyanosis.  Trace bilateral lower extremity edema with chronic appearing hyperpigmentation.  Neuro: Awake and alert, oriented to self, says year is 2025, month April, and does not answer why we are here in the hospital. Psych: Pleasant and cooperative.  Labs: Basic Metabolic Panel: Recent Labs    02/19/23 2106 02/20/23 0619  NA 134* 137  K 5.6* 5.8*  CL 97* 99  CO2 30 29  GLUCOSE 313* 346*  BUN 50* 55*  CREATININE 2.38* 2.26*  CALCIUM 8.4* 8.5*  Liver Function Tests: Recent Labs    02/19/23 2106  AST 31  ALT 15  ALKPHOS 92  BILITOT 1.0  PROT 6.3*  ALBUMIN 3.1*   No results for input(s): "LIPASE", "AMYLASE" in the last 72 hours. CBC: Recent Labs    02/19/23 2106 02/20/23 0619  WBC 7.6 7.3  NEUTROABS 5.1  --   HGB 11.7* 11.5*  HCT 37.3 36.7  MCV 92.6 92.2  PLT 157 141*   Cardiac Enzymes: No results for input(s): "CKTOTAL", "CKMB", "CKMBINDEX", "TROPONINIHS" in the last 72  hours. BNP: Recent Labs    02/19/23 2110  BNP 623.0*   D-Dimer: Recent Labs    02/19/23 2110  DDIMER 1.06*   Hemoglobin A1C: No results for input(s): "HGBA1C" in the last 72 hours. Fasting Lipid Panel: No results for input(s): "CHOL", "HDL", "LDLCALC", "TRIG", "CHOLHDL", "LDLDIRECT" in the last 72 hours. Thyroid Function Tests: No results for input(s): "TSH", "T4TOTAL", "T3FREE", "THYROIDAB" in the last 72 hours.  Invalid input(s): "FREET3" Anemia Panel: No results for input(s): "VITAMINB12", "FOLATE", "FERRITIN", "TIBC", "IRON", "RETICCTPCT" in the last 72 hours.   Radiology: MR BRAIN WO CONTRAST  Result Date: 02/20/2023 CLINICAL DATA:  Neuro deficit, acute, stroke suspected EXAM: MRI HEAD WITHOUT CONTRAST TECHNIQUE: Multiplanar, multiecho pulse sequences of the brain and surrounding structures were obtained without intravenous contrast. COMPARISON:  CT head June 5, 24. FINDINGS: Brain: No acute infarction, acute hemorrhage, hydrocephalus, extra-axial collection or mass lesion. Remote infarcts in the left internal capsule, occipital lobe and bilateral cerebellum. Vascular: Major arterial flow voids are maintained at the skull base. Skull and upper cervical spine: Normal marrow signal. Sinuses/Orbits: Clear sinuses.  No acute orbital findings. Other: No mastoid effusions. IMPRESSION: 1. No evidence of acute intracranial abnormality. 2. Remote infarcts. Electronically Signed   By: Feliberto Harts M.D.   On: 02/20/2023 11:58   CT HEAD CODE STROKE WO CONTRAST`  Result Date: 02/20/2023 CLINICAL DATA:  Code stroke.  Worsening speech EXAM: CT HEAD WITHOUT CONTRAST TECHNIQUE: Contiguous axial images were obtained from the base of the skull through the vertex without intravenous contrast. RADIATION DOSE REDUCTION: This exam was performed according to the departmental dose-optimization program which includes automated exposure control, adjustment of the mA and/or kV according to patient size  and/or use of iterative reconstruction technique. COMPARISON:  11/16/2022 FINDINGS: Brain: Chronic bilateral cerebellar, left occipital, and left low internal capsule infarcts. Lacunar infarct in the right centrum semiovale. Cerebral volume loss which is generalized. No evidence of acute infarct. No hemorrhage, hydrocephalus, or masslike finding Vascular: No hyperdense vessel when allowing for streak artifact in the posterior fossa. Skull: High left parietal burr hole Sinuses/Orbits: Negative ASPECTS (Alberta Stroke Program Early CT Score) - Ganglionic level infarction (caudate, lentiform nuclei, internal capsule, insula, M1-M3 cortex): 7 - Supraganglionic infarction (M4-M6 cortex): 3 Total score (0-10 with 10 being normal): 10 IMPRESSION: Chronic infarcts primarily in the posterior circulation. No acute or focal finding. Electronically Signed   By: Tiburcio Pea M.D.   On: 02/20/2023 07:02   CT Angio Chest PE W and/or Wo Contrast  Result Date: 02/19/2023 CLINICAL DATA:  Weakness and hypotension. EXAM: CT ANGIOGRAPHY CHEST WITH CONTRAST TECHNIQUE: Multidetector CT imaging of the chest was performed using the standard protocol during bolus administration of intravenous contrast. Multiplanar CT image reconstructions and MIPs were obtained to evaluate the vascular anatomy. RADIATION DOSE REDUCTION: This exam was performed according to the departmental dose-optimization program which includes automated exposure control, adjustment of the mA and/or kV according to  patient size and/or use of iterative reconstruction technique. CONTRAST:  75mL OMNIPAQUE IOHEXOL 350 MG/ML SOLN COMPARISON:  May 16, 2021 FINDINGS: Cardiovascular: There is moderate severity calcification of the aortic arch, without evidence of aortic aneurysm. Satisfactory opacification of the pulmonary arteries to the segmental level. No evidence of pulmonary embolism. Normal heart size. A small pericardial effusion is seen. Mediastinum/Nodes: No  enlarged mediastinal, hilar, or axillary lymph nodes. Thyroid gland, trachea, and esophagus demonstrate no significant findings. Lungs/Pleura: Mild diffuse interstitial thickening is noted. Mild atelectasis is seen along the posterior aspects of the right upper lobe and bilateral lower lobes. There are small bilateral pleural effusions. No pneumothorax is identified. Upper Abdomen: Multiple surgical clips are seen within the gallbladder fossa. Musculoskeletal: Multiple tortuous venous varices are seen within the subcutaneous fat of the anterior left chest wall. Multilevel degenerative changes seen throughout the thoracic spine with postoperative changes also seen within the lower thoracic and upper lumbar spine. Review of the MIP images confirms the above findings. IMPRESSION: 1. No evidence of pulmonary embolism. 2. Mild diffuse interstitial thickening which may represent mild pulmonary edema. 3. Small bilateral pleural effusions. 4. Small pericardial effusion. 5. Evidence of prior cholecystectomy. 6. Postoperative changes within the lower thoracic and upper lumbar spine. 7. Aortic atherosclerosis. Aortic Atherosclerosis (ICD10-I70.0). Electronically Signed   By: Aram Candela M.D.   On: 02/19/2023 22:56   DG Chest Port 1 View  Result Date: 02/19/2023 CLINICAL DATA:  [id number] Wheezing on expiration [id number] EXAM: PORTABLE CHEST - 1 VIEW COMPARISON:  11/15/2022 FINDINGS: Lungs are clear. Heart size upper limits normal. There is blunting of the left lateral costophrenic angle suggesting small effusion. Thoracolumbar fixation hardware partially visualized. Bilateral shoulder DJD. IMPRESSION: Possible small left effusion. Electronically Signed   By: Corlis Leak M.D.   On: 02/19/2023 21:41    ECHO 08/2022  1. Left ventricular ejection fraction, by estimation, is 70 to 75%. The  left ventricle has hyperdynamic function. The left ventricle has no  regional wall motion abnormalities. There is moderate concentric  left  ventricular hypertrophy. Left ventricular  diastolic parameters are consistent with Grade II diastolic dysfunction  (pseudonormalization).   2. Right ventricular systolic function is normal. The right ventricular  size is normal.   3. Left atrial size was mildly dilated.   4. Right atrial size was mildly dilated.   5. The mitral valve is normal in structure. Mild mitral valve  regurgitation. No evidence of mitral stenosis.   6. The aortic valve is normal in structure. Aortic valve regurgitation is  mild. Aortic valve sclerosis/calcification is present, without any  evidence of aortic stenosis.   7. The inferior vena cava is normal in size with greater than 50%  respiratory variability, suggesting right atrial pressure of 3 mmHg.   TELEMETRY reviewed by me (LT) 02/20/2023 : AF rate 120s earlier AM, now 90s  EKG reviewed by me: AFL rate 60s without concerning ST changes  Data reviewed by me (LT) 02/20/2023: ED note, admission H&P, nursing notes, neurology note last 24h vitals tele labs imaging I/O   Principal Problem:   Acute on chronic diastolic (congestive) heart failure (HCC) Active Problems:   Essential hypertension   Atrial flutter with rapid ventricular response (HCC)   Depression   Acute kidney injury superimposed on chronic kidney disease (HCC)   Type 2 diabetes mellitus with peripheral neuropathy (HCC)   Dyslipidemia   Hyperkalemia    ASSESSMENT AND PLAN:  Cheyenne Frank is an 89yoF with a PMH  of , paroxymal atrial flutter (eliquis), DM2, HTN, CKD, hx CVA, morbid obesity (BMI 45 kg/m), memory impairment who presented [facility] ED [facility]/19/2023 with generalized weakness and palpitations.  Found to be in atrial fibrillation with RVR and hypotensive to the 80s per EMS.  Cardiology is consulted for further assistance with her heart failure.  # Generalized weakness # Wheezing # intertrigo  Reportedly cough and wheezing for up to 1 month recently per the patient's caretaker.  Redness and irritation present along lower abdominal folds  -  further management per primary.  # Paroxysmal atrial flutter with RVR In AFL on telemetry with rate generally controlled in the 90s to low 100s. -Wean diltiazem drip, and start diltiazem 30 mg every 6 hours with goals to consolidate -Continue Coreg 25 mg twice daily -Continue Eliquis 2.5 mg twice daily for stroke prevention (age >68, creatinine >1.5)  # Acute on chronic HFpEF Slightly hypervolemic with some peripheral edema and abdominal distention, not hypoxic, but BNP elevated in the 700s. -Continue IV Lasix 40 mg daily -Monitor renal function and potassium closely -Continue GDMT with carvedilol, further additions limited by renal insufficiency  # AKI on CKD BUN/creatinine elevated but downtrending with current 55/2.26 and GFR 21.  This patient's plan of care was discussed and created with Dr. Darrold Junker and he is in agreement.  Signed: Rebeca Allegra , PA-C 02/20/2023, 3:14 PM [facility]

## 2023-02-20 NOTE — ED Notes (Signed)
Meds infusing.   ?

## 2023-02-20 NOTE — ED Notes (Signed)
1610 Pt returns to room from CT 0704 Dr Allena Katz joins cart

## 2023-02-20 NOTE — Assessment & Plan Note (Signed)
-   D50 insulin be given - We will hold off Cozaar. - This is likely associated with AKI on CKD. - We will follow potassium level.

## 2023-02-20 NOTE — Assessment & Plan Note (Signed)
-   The patient will be placed on supplemental coverage with NovoLog. - We will continue basal coverage. - We will continue Neurontin. 

## 2023-02-20 NOTE — ED Notes (Signed)
Meds given  purewick in place.  Iv meds infusing   repeat EKG done.

## 2023-02-20 NOTE — H&P (Addendum)
[place]   PATIENT NAME: Cheyenne Frank    MR#:  [id number]  DATE OF BIRTH:  09--1942  DATE OF ADMISSION:  02/19/2023  PRIMARY CARE PHYSICIAN: [place] [place] [facility]   Patient is coming from: Home  REQUESTING/REFERRING PHYSICIAN: Loleta Rose, MD  CHIEF COMPLAINT:   Chief Complaint  Patient presents with   Weakness   Hypotension  -Heart racing  HISTORY OF PRESENT ILLNESS:  Cheyenne Frank is a 82 y.o. Caucasian female with medical history significant for asthma, anxiety, type 2 diabetes mellitus, GERD, hypertension, OSA, RLS and CVA, who presented to the ER with acute onset of generalized weakness and palpitations.  She admitted to cough and wheezing without dyspnea.  She has been having lower extremity edema and dyspnea on exertion.  Her heart rate was in the 140s per EMS and systolic BP in the 80s with a blood glucose of 333.  No fever or chills.  No nausea or vomiting or abdominal pain.  No dysuria, oliguria or hematuria or flank pain.  She had a left eye procedure a couple days ago with residual left subconjunctival hemorrhage that she was told should resolve in the next couple of days.  No headache or dizziness or blurred vision.  No paresthesias or focal muscle weakness.  ED Course: Upon presentation to the emergency room, heart rate was 134 and vital signs were otherwise within normal.  Labs revealed sodium 134 potassium 5.6 and chloride 97.  Blood glucose was 313 with BUN of 50 and creatinine 2.38 above previous levels 44/1.68 on 11/18/2022 with a calcium 8.4 and total obtained 6.3 with albumin of 3.1.  BNP was 623.  Lactic acid was 1.8 and later 2.  CBC showed hemoglobin 11.7 hematocrit 37.3.  D-dimer was 1.06.  EKG: showed suspected atrial flutter with RVR of 135 with anteroseptal Q waves. Imaging: Chest CTA revealed no evidence for PE.  It showed mild diffuse interstitial thickening that may represent mild pulmonary edema and small bilateral pleural effusions,  small pericardial effusion, her prior cholecystectomy and postoperative changes within the lower thoracic and upper lumbar spine as well as aortic atherosclerosis.  The patient was initially given 1 L bolus of IV normal saline and 125 mg of IV Solu-Medrol as well as DuoNeb and IV Cardizem bolus of 15 mg followed by IV Cardizem drip.  She will be given 40 mg of IV Lasix.  She will be admitted to a progressive unit bed for further evaluation and management. PAST MEDICAL HISTORY:   Past Medical History:  Diagnosis Date   AKI (acute kidney injury) (HCC)    Anemia of chronic disease    Anxiety    Arthritis    Asthma    no problems since 1999 (after moving into a new home)   Chronic airway obstruction (HCC)    Complication of anesthesia    " feel like I'm dying after I wake up"   Diabetes mellitus without complication (HCC)    Diverticulosis    Dysrhythmia    unknown type    Fibromyalgia    Flat back syndrome    GERD (gastroesophageal reflux disease)    Hx of transfusion 2011   Hypertension    Itching    Macular degeneration    Nocturia    Postoperative anemia due to acute blood loss 04/09/2013   Restless leg syndrome    Sleep apnea    Dx 1990's unable to wear c-pap -   Stroke [facility]) 2010  verbal aphasia x 30 min - resolved - no problem since then    PAST SURGICAL HISTORY:   Past Surgical History:  Procedure Laterality Date   ABDOMINAL HYSTERECTOMY     rt so   BACK SURGERY  2010 / 2011   BREAST REDUCTION SURGERY     BURR HOLE FOR SUBDURAL HEMATOMA  2015   cataracts removed     CHOLECYSTECTOMY  1990   DILATION AND CURETTAGE OF UTERUS     HERNIA REPAIR     POSTERIOR LAMINECTOMY / DECOMPRESSION LUMBAR SPINE     with T10-S1 fusion   TONSILLECTOMY     TOTAL KNEE ARTHROPLASTY Left 04/06/2013   Procedure: LEFT TOTAL KNEE ARTHROPLASTY;  Surgeon: Loanne Drilling, MD;  Location: [facility];  Service: Orthopedics;  Laterality: Left;    SOCIAL HISTORY:   Social History   Tobacco  Use   Smoking status: Never   Smokeless tobacco: Never  Substance Use Topics   Alcohol use: No    Alcohol/week: 0.0 standard drinks of alcohol    FAMILY HISTORY:   Family History  Problem Relation Age of Onset   COPD Sister        had lung transplant   COPD Brother     DRUG ALLERGIES:   Allergies  Allergen Reactions   Codeine Palpitations    Other Reaction: RACES HEART   Dilaudid [Hydromorphone] Other (See Comments) and Rash    Other Reaction: lightheaded, altered ms, nervous, Paranoia paranoid Other Reaction: lightheaded, altered ms, nervous, Paranoia   Demerol [Meperidine] Other (See Comments) and Rash    agitated Agitation   Pravastatin Other (See Comments)    Muscle aches   Amiodarone Other (See Comments)    Per family pt had twitching while taking and cardiologist took pt off    Keppra [Levetiracetam]    Lipitor [Atorvastatin] Other (See Comments) and Itching    unknown   Nifedipine Other (See Comments)    unknown   Statins Rash    Makes her crazy   Sulfa Antibiotics Rash   Vancomycin Rash    deafness    REVIEW OF SYSTEMS:   ROS As per history of present illness. All pertinent systems were reviewed above. Constitutional, HEENT, cardiovascular, respiratory, GI, GU, musculoskeletal, neuro, psychiatric, endocrine, integumentary and hematologic systems were reviewed and are otherwise negative/unremarkable except for positive findings mentioned above in the HPI.   MEDICATIONS AT HOME:   Prior to Admission medications   Medication Sig Start Date End Date Taking? Authorizing Provider  acetaminophen (TYLENOL) 500 MG tablet Take 500-1,000 mg by mouth every 6 (six) hours as needed for mild pain or fever.    Yes [provider]  albuterol (VENTOLIN HFA) 108 (90 Base) MCG/ACT inhaler Inhale 2 puffs into the lungs every 6 (six) hours as needed for wheezing or shortness of breath. 05/19/20  Yes [provider]  apixaban (ELIQUIS) 5 MG TABS tablet Take  1 tablet (5 mg total) by mouth 2 (two) times daily. 05/22/21  Yes Burnadette Pop, MD  aspirin EC 81 MG tablet Take 1 tablet (81 mg total) by mouth daily. MAY RESUME ON 12/31/20 Patient taking differently: Take 81 mg by mouth daily. 12/31/20  Yes Osvaldo Shipper, MD  carvedilol (COREG) 25 MG tablet Take 25 mg by mouth 2 (two) times daily.    Yes [provider]  diltiazem (CARDIZEM CD) 120 MG 24 hr capsule Take 1 capsule (120 mg total) by mouth daily. 09/10/22 09/10/23 Yes Willeen Niece, MD  gabapentin (NEURONTIN) 300 MG capsule Take 1 capsule (300 mg total) by mouth 2 (two) times daily. 02/07/22  Yes Rhetta Mura, MD  hydrALAZINE (APRESOLINE) 25 MG tablet Take 1 tablet (25 mg total) by mouth 3 (three) times daily as needed (For systolic above 160). 10/30/21  Yes Arnetha Courser, MD  insulin glargine (LANTUS) 100 UNIT/ML injection Inject 0.64 mLs (64 Units total) into the skin at bedtime. 11/18/22  Yes Lurene Shadow, MD  insulin lispro (HUMALOG) 100 UNIT/ML injection Inject 0.1 mLs (10 Units total) into the skin 3 (three) times daily with meals. 11/18/22  Yes Lurene Shadow, MD  losartan (COZAAR) 25 MG tablet Take 25 mg by mouth daily. 07/03/22  Yes [provider]  Multiple Vitamin (MULTIVITAMIN WITH MINERALS) TABS tablet Take 1 tablet by mouth daily.   Yes [provider]  Multiple Vitamins-Minerals (ICAPS AREDS 2 PO) Take 1 capsule by mouth in the morning and at bedtime.   Yes [provider]  omeprazole (PRILOSEC) 40 MG capsule Take 40 mg by mouth daily.    Yes [provider]  oxybutynin (DITROPAN-XL) 10 MG 24 hr tablet Take 10 mg by mouth at bedtime. 10/09/21  Yes [provider]  PARoxetine (PAXIL) 40 MG tablet Take 1 tablet (40 mg total) by mouth at bedtime. 02/07/22  Yes Rhetta Mura, MD  rosuvastatin (CRESTOR) 5 MG tablet Take 2.5 mg by mouth at bedtime.   Yes [provider]  sitaGLIPtin (JANUVIA) 50 MG tablet Take 50 mg  by mouth daily.   Yes [provider]  torsemide (DEMADEX) 20 MG tablet Take 1 tablet (20 mg total) by mouth daily. Please start from 11/01/2021 10/30/21  Yes Arnetha Courser, MD  traZODone (DESYREL) 50 MG tablet Take 1.5 tablets (75 mg total) by mouth at bedtime. 11/18/22  Yes Lurene Shadow, MD      VITAL SIGNS:  Blood pressure 132/81, pulse 60, temperature 98.5 F (36.9 C), temperature source Oral, resp. rate 12, weight 104 kg, SpO2 99 %.  PHYSICAL EXAMINATION:  Physical Exam  GENERAL:  82 y.o.-year-old Caucasian female patient lying in the bed with no acute distress.  EYES: Pupils equal, round, reactive to light and accommodation. No scleral icterus. Extraocular muscles intact.  HEENT: Head atraumatic, normocephalic. Oropharynx and nasopharynx clear.  NECK:  Supple, no jugular venous distention. No thyroid enlargement, no tenderness.  LUNGS: Diminished bibasal breath sounds with bibasal rales.  No use of accessory muscles of respiration.  CARDIOVASCULAR: Regular tachycardic rhythm, S1, S2 normal. No murmurs, rubs, or gallops.  ABDOMEN: Soft, nondistended, nontender. Bowel sounds present. No organomegaly or mass.  EXTREMITIES: 1+ bilateral lower extremity pitting edema with no cyanosis, or clubbing.  NEUROLOGIC: Cranial nerves II through XII are intact. Muscle strength 5/5 in all extremities. Sensation intact. Gait not checked.  PSYCHIATRIC: The patient is alert and oriented x 3.  Normal affect and good eye contact. SKIN: No obvious rash, lesion, or ulcer.   LABORATORY PANEL:   CBC Recent Labs  Lab 02/19/23 2106  WBC 7.6  HGB 11.7*  HCT 37.3  PLT 157   ------------------------------------------------------------------------------------------------------------------  Chemistries  Recent Labs  Lab 02/19/23 2106  NA 134*  K 5.6*  CL 97*  CO2 30  GLUCOSE 313*  BUN 50*  CREATININE 2.38*  CALCIUM 8.4*  AST 31  ALT 15  ALKPHOS 92  BILITOT 1.0    ------------------------------------------------------------------------------------------------------------------  Cardiac Enzymes No results for input(s): "TROPONINI" in the last 168 hours. ------------------------------------------------------------------------------------------------------------------  RADIOLOGY:  CT Angio Chest  PE W and/or Wo Contrast  Result Date: 02/19/2023 CLINICAL DATA:  Weakness and hypotension. EXAM: CT ANGIOGRAPHY CHEST WITH CONTRAST TECHNIQUE: Multidetector CT imaging of the chest was performed using the standard protocol during bolus administration of intravenous contrast. Multiplanar CT image reconstructions and MIPs were obtained to evaluate the vascular anatomy. RADIATION DOSE REDUCTION: This exam was performed according to the departmental dose-optimization program which includes automated exposure control, adjustment of the mA and/or kV according to patient size and/or use of iterative reconstruction technique. CONTRAST:  75mL OMNIPAQUE IOHEXOL 350 MG/ML SOLN COMPARISON:  May 16, 2021 FINDINGS: Cardiovascular: There is moderate severity calcification of the aortic arch, without evidence of aortic aneurysm. Satisfactory opacification of the pulmonary arteries to the segmental level. No evidence of pulmonary embolism. Normal heart size. A small pericardial effusion is seen. Mediastinum/Nodes: No enlarged mediastinal, hilar, or axillary lymph nodes. Thyroid gland, trachea, and esophagus demonstrate no significant findings. Lungs/Pleura: Mild diffuse interstitial thickening is noted. Mild atelectasis is seen along the posterior aspects of the right upper lobe and bilateral lower lobes. There are small bilateral pleural effusions. No pneumothorax is identified. Upper Abdomen: Multiple surgical clips are seen within the gallbladder fossa. Musculoskeletal: Multiple tortuous venous varices are seen within the subcutaneous fat of the anterior left chest wall. Multilevel  degenerative changes seen throughout the thoracic spine with postoperative changes also seen within the lower thoracic and upper lumbar spine. Review of the MIP images confirms the above findings. IMPRESSION: 1. No evidence of pulmonary embolism. 2. Mild diffuse interstitial thickening which may represent mild pulmonary edema. 3. Small bilateral pleural effusions. 4. Small pericardial effusion. 5. Evidence of prior cholecystectomy. 6. Postoperative changes within the lower thoracic and upper lumbar spine. 7. Aortic atherosclerosis. Aortic Atherosclerosis (ICD10-I70.0). Electronically Signed   By: Aram Candela M.D.   On: 02/19/2023 22:56   DG Chest Port 1 View  Result Date: 02/19/2023 CLINICAL DATA:  [id number] Wheezing on expiration [id number] EXAM: PORTABLE CHEST - 1 VIEW COMPARISON:  11/15/2022 FINDINGS: Lungs are clear. Heart size upper limits normal. There is blunting of the left lateral costophrenic angle suggesting small effusion. Thoracolumbar fixation hardware partially visualized. Bilateral shoulder DJD. IMPRESSION: Possible small left effusion. Electronically Signed   By: Corlis Leak M.D.   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 - We will continue diuresis with IV Lasix. - We Will follow serial troponins. - We will follow I's and O's and daily weights. - Cardiology consult be obtained. - I notified Dr. Shirlee Latch about the patient.   Atrial flutter with rapid ventricular response (HCC) - We will continue IV Cardizem drip for now. - We will continue Eliquis.  Hyperkalemia - D50 insulin be given - We will hold off Cozaar. - This is likely associated with AKI on CKD. - We will follow potassium level.  Acute kidney injury superimposed on chronic kidney disease (HCC) - This is superimposed on stage IIIb chronic kidney disease. - This is  likely secondary to renal hypoperfusion due to acute CHF. - Will follow BMP with diuresis. - We will hold off nephrotoxins.  Type 2 diabetes mellitus with peripheral neuropathy (HCC) - The patient will be placed on supplemental coverage with NovoLog. - We will continue basal coverage. - We will continue Neurontin.  Essential hypertension - We will continue her antihypertensives.  Dyslipidemia We will continue  statin therapy.  Depression - We will continue Paxil and trazodone.   DVT prophylaxis: Lovenox.  Advanced Care Planning:  Code Status: full code.  This was discussed with her. Family Communication:  The plan of care was discussed in details with the patient (and family). I answered all questions. The patient agreed to proceed with the above mentioned plan. Further management will depend upon hospital course. Disposition Plan: Back to previous home environment Consults called: Cardiology. All the records are reviewed and case discussed with ED provider.  Status is: Inpatient   At the time of the admission, it appears that the appropriate admission status for this patient is inpatient.  This is judged to be reasonable and necessary in order to provide the required intensity of service to ensure the patient's safety given the presenting symptoms, physical exam findings and initial radiographic and laboratory data in the context of comorbid conditions.  The patient requires inpatient status due to high intensity of service, high risk of further deterioration and high frequency of surveillance required.  I certify that at the time of admission, it is my clinical judgment that the patient will require inpatient hospital care extending more than 2 midnights.                            Dispo: The patient is from: Home              Anticipated d/c is to: Home              Patient currently is not medically stable to d/c.              Difficult to place patient: No  Hannah Beat M.D  on 02/20/2023 at 6:13 AM  [facility]   From 7 PM-7 AM, contact night-coverage [email or web address]  CC: Primary care physician; Terrence [facility], [place] [facility]

## 2023-02-20 NOTE — ED Notes (Signed)
[id number] Assumed care from [place] RN.

## 2023-02-20 NOTE — Assessment & Plan Note (Addendum)
-   This is superimposed on stage IIIb chronic kidney disease. - This is likely secondary to renal hypoperfusion due to acute CHF. - Will follow BMP with diuresis. - We will hold off nephrotoxins.

## 2023-02-20 NOTE — Plan of Care (Signed)
  Problem: Education: Goal: Ability to demonstrate management of disease process will improve Outcome: Progressing Goal: Ability to verbalize understanding of medication therapies will improve Outcome: Progressing   Problem: Activity: Goal: Capacity to carry out activities will improve Outcome: Progressing   

## 2023-02-21 DIAGNOSIS — I5033 Acute on chronic diastolic (congestive) heart failure: Secondary | ICD-10-CM | POA: Diagnosis not present

## 2023-02-21 LAB — POTASSIUM
Potassium: 4.8 mmol/L (ref 3.5–5.1)
Potassium: 4.8 mmol/L (ref 3.5–5.1)
Potassium: 5.1 mmol/L (ref 3.5–5.1)

## 2023-02-21 LAB — CBC
HCT: 33.9 % — ABNORMAL LOW (ref 36.0–46.0)
Hemoglobin: 10.8 g/dL — ABNORMAL LOW (ref 12.0–15.0)
MCH: 29.3 pg (ref 26.0–34.0)
MCHC: 31.9 g/dL (ref 30.0–36.0)
MCV: 91.9 fL (ref 80.0–100.0)
Platelets: 157 10*3/uL (ref 150–400)
RBC: 3.69 MIL/uL — ABNORMAL LOW (ref 3.87–5.11)
RDW: 14.4 % (ref 11.5–15.5)
WBC: 10 10*3/uL (ref 4.0–10.5)
nRBC: 0 % (ref 0.0–0.2)

## 2023-02-21 LAB — GLUCOSE, CAPILLARY
Glucose-Capillary: 376 mg/dL — ABNORMAL HIGH (ref 70–99)
Glucose-Capillary: 256 mg/dL — ABNORMAL HIGH (ref 70–99)
Glucose-Capillary: 271 mg/dL — ABNORMAL HIGH (ref 70–99)
Glucose-Capillary: 222 mg/dL — ABNORMAL HIGH (ref 70–99)
Glucose-Capillary: 105 mg/dL — ABNORMAL HIGH (ref 70–99)

## 2023-02-21 LAB — BASIC METABOLIC PANEL
Anion gap: 11 (ref 5–15)
BUN: 74 mg/dL — ABNORMAL HIGH (ref 8–23)
CO2: 28 mmol/L (ref 22–32)
Calcium: 8.6 mg/dL — ABNORMAL LOW (ref 8.9–10.3)
Chloride: 100 mmol/L (ref 98–111)
Creatinine, Ser: 2.29 mg/dL — ABNORMAL HIGH (ref 0.44–1.00)
GFR, Estimated: 21 mL/min — ABNORMAL LOW (ref >60)
Glucose, Bld: 267 mg/dL — ABNORMAL HIGH (ref 70–99)
Potassium: 5.1 mmol/L (ref 3.5–5.1)
Sodium: 139 mmol/L (ref 135–145)

## 2023-02-21 LAB — MAGNESIUM: Magnesium: 2.3 mg/dL (ref 1.7–2.4)

## 2023-02-21 MED ORDER — DILTIAZEM HCL 30 MG PO TABS
60.0000 mg | ORAL_TABLET | Freq: Four times a day (QID) | ORAL | Status: DC
  Administered 2023-02-21 (×2): 60 mg via ORAL
  Filled 2023-02-21 (×2): qty 2

## 2023-02-21 MED ORDER — INSULIN ASPART 100 UNIT/ML IJ SOLN
5.0000 [IU] | Freq: Three times a day (TID) | INTRAMUSCULAR | Status: DC
  Administered 2023-02-21: 5 [IU] via SUBCUTANEOUS
  Filled 2023-02-21: qty 1

## 2023-02-21 MED ORDER — METOPROLOL TARTRATE 25 MG PO TABS
25.0000 mg | ORAL_TABLET | Freq: Four times a day (QID) | ORAL | Status: DC
  Administered 2023-02-21 – 2023-02-22 (×2): 25 mg via ORAL
  Filled 2023-02-21 (×2): qty 1

## 2023-02-21 MED ORDER — METOPROLOL TARTRATE 25 MG PO TABS
12.5000 mg | ORAL_TABLET | Freq: Four times a day (QID) | ORAL | Status: DC
  Administered 2023-02-21: 12.5 mg via ORAL
  Filled 2023-02-21: qty 1

## 2023-02-21 NOTE — Progress Notes (Signed)
Mobility Specialist - Progress Note   During mobility: SpO2(89-90) Post-mobility: SPO2(92)     02/21/23 1542  Mobility  Activity Ambulated with assistance in room  Level of Assistance Contact guard assist, steadying assist  Assistive Device Front wheel walker  Distance Ambulated (ft) 15 ft  Range of Motion/Exercises Active  Activity Response Tolerated well  Mobility Referral Yes  $Mobility charge 1 Mobility  Mobility Specialist Start Time (ACUTE ONLY) 1522  Mobility Specialist Stop Time (ACUTE ONLY) 1542  Mobility Specialist Time Calculation (min) (ACUTE ONLY) 20 min   Pt resting in bed on RA upon entry. Pt endorses no SOB but wheezing. (RN present at bedside). Pt performs bed mobility ModA and sit EOB. Pt STS and ambulates in room CGA with RW. Pt endorses fatigue after 10 feet. Pt returns to bed (SpO2 stat after ambulation: 88-90) and left with needs in reach and bed alarm activated. Pt recovered to 92% after returning to bed but currently fluctuating between 88-91%. RN notified.   Johnathan Hausen Mobility Specialist 02/21/23, 3:53 PM

## 2023-02-21 NOTE — Plan of Care (Signed)

## 2023-02-21 NOTE — Progress Notes (Signed)
Progress Note   Patient: Cheyenne Frank WUJ:[id number] DOB: 1941/08/27 DOA: 02/19/2023     1 DOS: the patient was seen and examined on 02/21/2023   Brief hospital course:  ASCHLEY Frank is a 82 y.o. Caucasian female with medical history significant for asthma, anxiety, type 2 diabetes mellitus, GERD, hypertension, OSA, RLS and CVA, who presented to the ER with acute onset of generalized weakness and palpitations.  She admitted to cough and wheezing without dyspnea.  She has been having lower extremity edema and dyspnea on exertion.  Her heart rate was in the 140s per EMS and systolic BP in the 80s with a blood glucose of 333.  No fever or chills.  No nausea or vomiting or abdominal pain.  No dysuria, oliguria or hematuria or flank pain.  She had a left eye procedure a couple days ago with residual left subconjunctival hemorrhage that she was told should resolve in the next couple of days.  No headache or dizziness or blurred vision.  No paresthesias or focal muscle weakness.   ED Course: Upon presentation to the emergency room, heart rate was 134 and vital signs were otherwise within normal.  Labs revealed sodium 134 potassium 5.6 and chloride 97.  Blood glucose was 313 with BUN of 50 and creatinine 2.38 above previous levels 44/1.68 on 11/18/2022 with a calcium 8.4 and total obtained 6.3 with albumin of 3.1.  BNP was 623.  Lactic acid was 1.8 and later 2.  CBC showed hemoglobin 11.7 hematocrit 37.3.  D-dimer was 1.06.   EKG: showed suspected atrial flutter with RVR of 135 with anteroseptal Q waves. Imaging: Chest CTA revealed no evidence for PE.  It showed mild diffuse interstitial thickening that may represent mild pulmonary edema and small bilateral pleural effusions, small pericardial effusion, her prior cholecystectomy and postoperative changes within the lower thoracic and upper lumbar spine as well as aortic atherosclerosis.   The patient was initially given 1 L bolus of IV normal saline and 125 mg  of IV Solu-Medrol as well as DuoNeb and IV Cardizem bolus of 15 mg followed by IV Cardizem drip.  She will be given 40 mg of IV Lasix.  She will be admitted to a progressive unit bed for further evaluation and management.    Assessment and Plan: Acute on chronic diastolic dysfunction CHF Most likely triggered by rapid A-fib Last known LVEF of 70 to 75% with LVH and grade 2 diastolic dysfunction from a 2D echocardiogram which was done 12/23 Continue Lasix 40 mg IV daily Optimize blood pressure control Continue metoprolol Maintain low-salt diet Check daily weight    A-fib with rapid ventricular rate Currently off Cardizem drip Rate controlled on oral Cardizem and metoprolol Continue Eliquis as secondary prophylaxis for an acute stroke    TIA Patient with episodes of slurred speech which has resolved Code stroke was called and patient had a stat CT scan of the head without contrast which was negative for bleed MRI of the brain showed remote infarct with no acute intracranial abnormality Appreciate neurology input Optimize blood pressure control    Hyperkalemia Most likely secondary to AKI and ARB use Improved   Diabetes mellitus type 2 with hyperglycemia A1c of 10.2 consistent with poor control Continue long-acting insulin and NovoLog 5 units with meals   Acute kidney injury superimposed on stage IIIb chronic kidney disease Renal function appears stable Monitor closely while on diuretic therapy Hold Cozaar    Depression Continue Paxil and trazodone  Morbid obesity BMI 45.31 Complicates overall prognosis and care Lifestyle modification and exercise has been discussed with patient in detail    Physical deconditioning PT evaluation            Subjective: Patient is seen and examined at the bedside.  Her caregiver is at the bedside.  Speech is back to baseline and is intelligible.  Physical Exam: Vitals:   02/20/23 1422 02/20/23 2315 02/21/23  0354 02/21/23 1149  BP: 125/74 113/78 114/85 112/82  Pulse:  (!) 133 (!) 130 (!) 135  Resp: 20 20 18 19   Temp: 97.8 F (36.6 C) 98.2 F (36.8 C) 98.2 F (36.8 C) 97.8 F (36.6 C)  TempSrc: Oral   Oral  SpO2: 93% 92% 94% 92%  Weight: 114.2 kg     Height: 5' 2.5" (1.588 m)      GENERAL:  82 y.o.-year-old Caucasian female patient lying in the bed with no acute distress.  EYES: Pupils equal, round, reactive to light and accommodation. No scleral icterus. Extraocular muscles intact.  HEENT: Head atraumatic, normocephalic. Oropharynx and nasopharynx clear.  NECK:  Supple, no jugular venous distention. No thyroid enlargement, no tenderness.  LUNGS: Diminished bibasal breath sounds with bibasal rales.  No use of accessory muscles of respiration.  CARDIOVASCULAR:, S1, S2 normal. No murmurs, rubs, or gallops.  ABDOMEN: Soft, nondistended, nontender. Bowel sounds present. No organomegaly or mass.  EXTREMITIES: 1+ bilateral lower extremity pitting edema with no cyanosis, or clubbing.  NEUROLOGIC: Cranial nerves II through XII are intact. Muscle strength 5/5 in all extremities. Sensation intact. Gait not checked.  PSYCHIATRIC: The patient is alert and oriented x 3.  Normal affect and good eye contact. SKIN: Abdominal folds bilaterally Data Reviewed: Labs reviewed.  Potassium 5.1, BUN 74, creatinine 2.29 There are no new results to review at this time.  Family Communication: Plan of care discussed with caregiver at the bedside  Disposition: Status is: Inpatient Remains inpatient appropriate because: IV diuretics  Planned Discharge Destination: Home with Home Health    Time spent: 35 minutes  Author: Lucile Shutters, MD 02/21/2023 1:56 PM  For on call review [email or web address].

## 2023-02-21 NOTE — Progress Notes (Signed)
Cheyenne Frank       Patient ID: [name] MRN: [id number] DOB/AGE: 1940/10/14 82 y.o.  Admit date: 02/19/2023 Referring Physician Dr. Valente David Primary Physician Dr. Laurine Blazer  Primary Cardiologist Dr. Darrold Junker (last seen 2022)  Reason for Consultation AF RVR  HPI: Cheyenne Frank is an 82yoF with a PMH of , paroxymal atrial flutter (eliquis), DM2, HTN, CKD, hx CVA, morbid obesity (BMI 45 kg/m), memory impairment who presented to [facility] ED 02/19/2023 with generalized weakness and palpitations.  Found to be in atrial fibrillation with RVR and hypotensive to the 80s per EMS.  Cardiology is consulted for further assistance with her heart failure.  Interval History:  -No acute events -Sitting upright eating lunch with caregiver at bedside and daughter present by phone. -The patient has no complaints, and upright eating lunch.  Still with dry cough and wheezing but no chest pain, palpitations, or shortness of breath.  On room air. -Remains in AFL RVR rate uncontrolled in the 130s-140s  Review of systems complete and found to be negative unless listed above     Past Medical History:  Diagnosis Date   AKI (acute kidney injury) (HCC)    Anemia of chronic disease    Anxiety    Arthritis    Asthma    no problems since 1999 (after moving into a new home)   Chronic airway obstruction (HCC)    Complication of anesthesia    " feel like I'm dying after I wake up"   Diabetes mellitus without complication (HCC)    Diverticulosis    Dysrhythmia    unknown type    Fibromyalgia    Flat back syndrome    GERD (gastroesophageal reflux disease)    Hx of transfusion 2011   Hypertension    Itching    Macular degeneration    Nocturia    Postoperative anemia due to acute blood loss 04/09/2013   Restless leg syndrome    Sleep apnea    Dx 1990's unable to wear c-pap -   Stroke (HCC) 2010   verbal aphasia x 30 min - resolved - no problem since then    Past  Surgical History:  Procedure Laterality Date   ABDOMINAL HYSTERECTOMY     rt so   BACK SURGERY  2010 / 2011   BREAST REDUCTION SURGERY     BURR HOLE FOR SUBDURAL HEMATOMA  2015   cataracts removed     CHOLECYSTECTOMY  1990   DILATION AND CURETTAGE OF UTERUS     HERNIA REPAIR     POSTERIOR LAMINECTOMY / DECOMPRESSION LUMBAR SPINE     with T10-S1 fusion   TONSILLECTOMY     TOTAL KNEE ARTHROPLASTY Left 04/06/2013   Procedure: LEFT TOTAL KNEE ARTHROPLASTY;  Surgeon: Loanne Drilling, MD;  Location: [facility] ORS;  Service: Orthopedics;  Laterality: Left;    Medications Prior to Admission  Medication Sig Dispense Refill Last Dose   acetaminophen (TYLENOL) 500 MG tablet Take 500-1,000 mg by mouth every 6 (six) hours as needed for mild pain or fever.    unk at unk   albuterol (VENTOLIN HFA) 108 (90 Base) MCG/ACT inhaler Inhale 2 puffs into the lungs every 6 (six) hours as needed for wheezing or shortness of breath.   unk at unk   apixaban (ELIQUIS) 5 MG TABS tablet Take 1 tablet (5 mg total) by mouth 2 (two) times daily. 60 tablet 0 02/19/2023   aspirin EC 81 MG tablet Take  1 tablet (81 mg total) by mouth daily. MAY RESUME ON 12/31/20 (Patient taking differently: Take 81 mg by mouth daily.) 30 tablet 11 02/19/2023   carvedilol (COREG) 25 MG tablet Take 25 mg by mouth 2 (two) times daily.    02/19/2023   diltiazem (CARDIZEM CD) 120 MG 24 hr capsule Take 1 capsule (120 mg total) by mouth daily. 30 capsule 11 02/19/2023   gabapentin (NEURONTIN) 300 MG capsule Take 1 capsule (300 mg total) by mouth 2 (two) times daily. 6 capsule 0 02/19/2023   hydrALAZINE (APRESOLINE) 25 MG tablet Take 1 tablet (25 mg total) by mouth 3 (three) times daily as needed (For systolic above 160). 30 tablet 0 unk at unk   insulin glargine (LANTUS) 100 UNIT/ML injection Inject 0.64 mLs (64 Units total) into the skin at bedtime.   02/18/2023   insulin lispro (HUMALOG) 100 UNIT/ML injection Inject 0.1 mLs (10 Units total) into the skin 3 (three)  times daily with meals.   02/19/2023   losartan (COZAAR) 25 MG tablet Take 25 mg by mouth daily.   02/19/2023   Multiple Vitamin (MULTIVITAMIN WITH MINERALS) TABS tablet Take 1 tablet by mouth daily.   02/19/2023   Multiple Vitamins-Minerals (ICAPS AREDS 2 PO) Take 1 capsule by mouth in the morning and at bedtime.   02/19/2023   omeprazole (PRILOSEC) 40 MG capsule Take 40 mg by mouth daily.    02/19/2023   oxybutynin (DITROPAN-XL) 10 MG 24 hr tablet Take 10 mg by mouth at bedtime.   02/19/2023   PARoxetine (PAXIL) 40 MG tablet Take 1 tablet (40 mg total) by mouth at bedtime. 6 tablet 0 02/19/2023   rosuvastatin (CRESTOR) 5 MG tablet Take 2.5 mg by mouth at bedtime.   02/19/2023   sitaGLIPtin (JANUVIA) 50 MG tablet Take 50 mg by mouth daily.   02/19/2023   torsemide (DEMADEX) 20 MG tablet Take 1 tablet (20 mg total) by mouth daily. Please start from 11/01/2021 90 tablet 1 02/19/2023   traZODone (DESYREL) 50 MG tablet Take 1.5 tablets (75 mg total) by mouth at bedtime.   02/19/2023   Social History   Socioeconomic History   Marital status: Single    Spouse name: Not on file   Number of children: Not on file   Years of education: Not on file   Highest education level: Not on file  Occupational History   Not on file  Tobacco Use   Smoking status: Never   Smokeless tobacco: Never  Substance and Sexual Activity   Alcohol use: No    Alcohol/week: 0.0 standard drinks of alcohol   Drug use: No   Sexual activity: Not Currently  Other Topics Concern   Not on file  Social History Narrative   Not on file   Social Determinants of Health   Financial Resource Strain: Not on file  Food Insecurity: No Food Insecurity (11/16/2022)   Hunger Vital Sign    Worried About Running Out of Food in the Last Year: Never true    Ran Out of Food in the Last Year: Never true  Transportation Needs: No Transportation Needs (11/16/2022)   PRAPARE - Administrator, Civil Service (Medical): No    Lack of Transportation  (Non-Medical): No  Physical Activity: Not on file  Stress: Not on file  Social Connections: Not on file  Intimate Partner Violence: Not At Risk (11/16/2022)   Humiliation, Afraid, Rape, and Kick questionnaire    Fear of Current or Ex-Partner: No  Emotionally Abused: No    Physically Abused: No    Sexually Abused: No    Family History  Problem Relation Age of Onset   COPD Sister        had lung transplant   COPD Brother       Intake/Output Summary (Last 24 hours) at 02/21/2023 0825 Last data filed at 02/20/2023 1500 Gross per 24 hour  Intake 240 ml  Output 300 ml  Net -60 ml     Vitals:   02/20/23 1250 02/20/23 1422 02/20/23 2315 02/21/23 0354  BP: 116/88 125/74 113/78 114/85  Pulse:   (!) 133 (!) 130  Resp:  20 20 18   Temp:  97.8 F (36.6 C) 98.2 F (36.8 C) 98.2 F (36.8 C)  TempSrc:  Oral    SpO2:  93% 92% 94%  Weight:  114.2 kg    Height:  5' 2.5" (1.588 m)      PHYSICAL EXAM General: Elderly female, well nourished, in no acute distress.  Sitting upright in bed eating lunch with caretaker at bedside and daughter present by phone.   HEENT:  Normocephalic and atraumatic. Neck:  No JVD.  Lungs: Normal respiratory effort on room air.  Decreased breath sounds with diffuse expiratory wheezes.   Heart: Tacky irregularly irregular  Normal S1 and S2 without gallops or murmurs.  Abdomen: Mildly distended appearing with excess adiposity, nontender to palpation.  Improved redness and erythema with whitish substance over lower abdominal folds bilaterally Msk: Normal strength and tone for age. Extremities: Warm and well perfused. No clubbing, cyanosis.  Trace bilateral lower extremity edema with chronic appearing hyperpigmentation.  Neuro: Awake and alert, answer simple questions appropriately.   Psych: Pleasant and cooperative.  Labs: Basic Metabolic Panel: Recent Labs    02/19/23 2106 02/20/23 0619 02/20/23 1536 02/21/23 0511 02/21/23 0653  NA 134* 137  --   --   --    K 5.6* 5.8*   < > 4.8 4.8  CL 97* 99  --   --   --   CO2 30 29  --   --   --   GLUCOSE 313* 346*  --   --   --   BUN 50* 55*  --   --   --   CREATININE 2.38* 2.26*  --   --   --   CALCIUM 8.4* 8.5*  --   --   --    < > = values in this interval not displayed.    Liver Function Tests: Recent Labs    02/19/23 2106  AST 31  ALT 15  ALKPHOS 92  BILITOT 1.0  PROT 6.3*  ALBUMIN 3.1*    No results for input(s): "LIPASE", "AMYLASE" in the last 72 hours. CBC: Recent Labs    02/19/23 2106 02/20/23 0619  WBC 7.6 7.3  NEUTROABS 5.1  --   HGB 11.7* 11.5*  HCT 37.3 36.7  MCV 92.6 92.2  PLT 157 141*    Cardiac Enzymes: No results for input(s): "CKTOTAL", "CKMB", "CKMBINDEX", "TROPONINIHS" in the last 72 hours. BNP: Recent Labs    02/19/23 2110  BNP 623.0*    D-Dimer: Recent Labs    02/19/23 2110  DDIMER 1.06*    Hemoglobin A1C: Recent Labs    02/20/23 0619  HGBA1C 10.2*   Fasting Lipid Panel: No results for input(s): "CHOL", "HDL", "LDLCALC", "TRIG", "CHOLHDL", "LDLDIRECT" in the last 72 hours. Thyroid Function Tests: No results for input(s): "TSH", "T4TOTAL", "T3FREE", "THYROIDAB" in the last  72 hours.  Invalid input(s): "FREET3" Anemia Panel: No results for input(s): "VITAMINB12", "FOLATE", "FERRITIN", "TIBC", "IRON", "RETICCTPCT" in the last 72 hours.   Radiology: MR BRAIN WO CONTRAST  Result Date: 02/20/2023 CLINICAL DATA:  Neuro deficit, acute, stroke suspected EXAM: MRI HEAD WITHOUT CONTRAST TECHNIQUE: Multiplanar, multiecho pulse sequences of the brain and surrounding structures were obtained without intravenous contrast. COMPARISON:  CT head June 5, 24. FINDINGS: Brain: No acute infarction, acute hemorrhage, hydrocephalus, extra-axial collection or mass lesion. Remote infarcts in the left internal capsule, occipital lobe and bilateral cerebellum. Vascular: Major arterial flow voids are maintained at the skull base. Skull and upper cervical spine: Normal  marrow signal. Sinuses/Orbits: Clear sinuses.  No acute orbital findings. Other: No mastoid effusions. IMPRESSION: 1. No evidence of acute intracranial abnormality. 2. Remote infarcts. Electronically Signed   By: Feliberto Harts M.D.   On: 02/20/2023 11:58   CT HEAD CODE STROKE WO CONTRAST`  Result Date: 02/20/2023 CLINICAL DATA:  Code stroke.  Worsening speech EXAM: CT HEAD WITHOUT CONTRAST TECHNIQUE: Contiguous axial images were obtained from the base of the skull through the vertex without intravenous contrast. RADIATION DOSE REDUCTION: This exam was performed according to the departmental dose-optimization program which includes automated exposure control, adjustment of the mA and/or kV according to patient size and/or use of iterative reconstruction technique. COMPARISON:  11/16/2022 FINDINGS: Brain: Chronic bilateral cerebellar, left occipital, and left low internal capsule infarcts. Lacunar infarct in the right centrum semiovale. Cerebral volume loss which is generalized. No evidence of acute infarct. No hemorrhage, hydrocephalus, or masslike finding Vascular: No hyperdense vessel when allowing for streak artifact in the posterior fossa. Skull: High left parietal burr hole Sinuses/Orbits: Negative ASPECTS (Alberta Stroke Program Early CT Score) - Ganglionic level infarction (caudate, lentiform nuclei, internal capsule, insula, M1-M3 cortex): 7 - Supraganglionic infarction (M4-M6 cortex): 3 Total score (0-10 with 10 being normal): 10 IMPRESSION: Chronic infarcts primarily in the posterior circulation. No acute or focal finding. Electronically Signed   By: Tiburcio Pea M.D.   On: 02/20/2023 07:02   CT Angio Chest PE W and/or Wo Contrast  Result Date: 02/19/2023 CLINICAL DATA:  Weakness and hypotension. EXAM: CT ANGIOGRAPHY CHEST WITH CONTRAST TECHNIQUE: Multidetector CT imaging of the chest was performed using the standard protocol during bolus administration of intravenous contrast. Multiplanar CT  image reconstructions and MIPs were obtained to evaluate the vascular anatomy. RADIATION DOSE REDUCTION: This exam was performed according to the departmental dose-optimization program which includes automated exposure control, adjustment of the mA and/or kV according to patient size and/or use of iterative reconstruction technique. CONTRAST:  75mL OMNIPAQUE IOHEXOL 350 MG/ML SOLN COMPARISON:  May 16, 2021 FINDINGS: Cardiovascular: There is moderate severity calcification of the aortic arch, without evidence of aortic aneurysm. Satisfactory opacification of the pulmonary arteries to the segmental level. No evidence of pulmonary embolism. Normal heart size. A small pericardial effusion is seen. Mediastinum/Nodes: No enlarged mediastinal, hilar, or axillary lymph nodes. Thyroid gland, trachea, and esophagus demonstrate no significant findings. Lungs/Pleura: Mild diffuse interstitial thickening is noted. Mild atelectasis is seen along the posterior aspects of the right upper lobe and bilateral lower lobes. There are small bilateral pleural effusions. No pneumothorax is identified. Upper Abdomen: Multiple surgical clips are seen within the gallbladder fossa. Musculoskeletal: Multiple tortuous venous varices are seen within the subcutaneous fat of the anterior left chest wall. Multilevel degenerative changes seen throughout the thoracic spine with postoperative changes also seen within the lower thoracic and upper lumbar spine. Review of  the MIP images confirms the above findings. IMPRESSION: 1. No evidence of pulmonary embolism. 2. Mild diffuse interstitial thickening which may represent mild pulmonary edema. 3. Small bilateral pleural effusions. 4. Small pericardial effusion. 5. Evidence of prior cholecystectomy. 6. Postoperative changes within the lower thoracic and upper lumbar spine. 7. Aortic atherosclerosis. Aortic Atherosclerosis (ICD10-I70.0). Electronically Signed   By: Aram Candela M.D.   On:  02/19/2023 22:56   DG Chest Port 1 View  Result Date: 02/19/2023 CLINICAL DATA:  [id number] Wheezing on expiration [id number] EXAM: PORTABLE CHEST - 1 VIEW COMPARISON:  11/15/2022 FINDINGS: Lungs are clear. Heart size upper limits normal. There is blunting of the left lateral costophrenic angle suggesting small effusion. Thoracolumbar fixation hardware partially visualized. Bilateral shoulder DJD. IMPRESSION: Possible small left effusion. Electronically Signed   By: Corlis Leak M.D.   On: 02/19/2023 21:41    ECHO 08/2022  1. Left ventricular ejection fraction, by estimation, is 70 to 75%. The  left ventricle has hyperdynamic function. The left ventricle has no  regional wall motion abnormalities. There is moderate concentric left  ventricular hypertrophy. Left ventricular  diastolic parameters are consistent with Grade II diastolic dysfunction  (pseudonormalization).   2. Right ventricular systolic function is normal. The right ventricular  size is normal.   3. Left atrial size was mildly dilated.   4. Right atrial size was mildly dilated.   5. The mitral valve is normal in structure. Mild mitral valve  regurgitation. No evidence of mitral stenosis.   6. The aortic valve is normal in structure. Aortic valve regurgitation is  mild. Aortic valve sclerosis/calcification is present, without any  evidence of aortic stenosis.   7. The inferior vena cava is normal in size with greater than 50%  respiratory variability, suggesting right atrial pressure of 3 mmHg.   TELEMETRY reviewed by me (LT) 02/21/2023 : AFL 130s-140s  EKG reviewed by me: AFL rate 60s without concerning ST changes  Data reviewed by me (LT) 02/21/2023: Close progress Frank, last 24h vitals tele labs imaging I/O   Principal Problem:   Acute on chronic diastolic (congestive) heart failure (HCC) Active Problems:   Essential hypertension   Atrial flutter with rapid ventricular response (HCC)   Depression   Acute kidney injury  superimposed on chronic kidney disease (HCC)   Type 2 diabetes mellitus with peripheral neuropathy (HCC)   Dyslipidemia   Hyperkalemia    ASSESSMENT AND PLAN:  Cheyenne Frank is an 44yoF with a PMH of , paroxymal atrial flutter (eliquis), DM2, HTN, CKD, hx CVA, morbid obesity (BMI 45 kg/m), memory impairment who presented to [facility] ED 02/19/2023 with generalized weakness and palpitations.  Found to be in atrial fibrillation with RVR and hypotensive to the 80s per EMS.  Cardiology is consulted for further assistance with her heart failure.  # Generalized weakness # Wheezing # intertrigo  Reportedly cough and wheezing for up to 1 month recently per the patient's caretaker. Redness and irritation present along lower abdominal folds, improved after topical ketoconazole  # Paroxysmal atrial flutter with RVR In AFL on telemetry with rate elevated today in the 130s-140s. -S/p diltiazem drip -Increase diltiazem to 60 mg every 6 hours with goals to consolidate -Start metoprolol to tartrate 12.5 mg every 6 hours -Stop Coreg 25 mg twice daily -Continue Eliquis 2.5 mg twice daily for stroke prevention (age >30, creatinine >1.5)  # Acute on chronic HFpEF Slightly hypervolemic with some peripheral edema and abdominal distention, not hypoxic, but BNP elevated in the  700s. -Continue IV Lasix 40 mg daily -Monitor renal function and potassium closely -Continue GDMT with metoprolol, further additions limited by renal insufficiency  # AKI on CKD BUN/creatinine elevated but downtrending with current 55/2.26 and GFR 21.  This patient's plan of care was discussed and created with Dr. Darrold Junker and he is in agreement.  Signed: Rebeca Allegra , PA-C 02/21/2023, 8:25 AM [facility]

## 2023-02-21 NOTE — Progress Notes (Signed)
MEWS Progress Note  Patient Details Name: VIANKA YLITALO MRN: [id number] DOB: 1941-06-16 Today's Date: 02/21/2023   MEWS Flowsheet Documentation:  Assess: MEWS Score Temp: 98.2 F (36.8 C) BP: 113/78 MAP (mmHg): 85 Pulse Rate: (!) 133 ECG Heart Rate: (!) 130 Resp: 20 Level of Consciousness: Alert SpO2: 92 % O2 Device: Room Air Assess: MEWS Score MEWS Temp: 0 MEWS Systolic: 0 MEWS Pulse: 3 MEWS RR: 0 MEWS LOC: 0 MEWS Score: 3 MEWS Score Color: Yellow Assess: SIRS CRITERIA SIRS Temperature : 0 SIRS Respirations : 0 SIRS Pulse: 1 SIRS WBC: 0 SIRS Score Sum : 1 Assess: if the MEWS score is Yellow or Red Were vital signs taken at a resting state?: Yes Focused Assessment: No change from prior assessment Does the patient meet 2 or more of the SIRS criteria?: No MEWS guidelines implemented : No, other (Comment) Notify: Charge Nurse/RN Name of Charge Nurse/RN Notified: Tiffany, RN      Marylu Lund  [place] Stegemann 02/21/2023, 3:35 AM

## 2023-02-21 NOTE — Inpatient Diabetes Management (Signed)
Inpatient Diabetes Program Recommendations  AACE/ADA: New Consensus Statement on Inpatient Glycemic Control (2015)  Target Ranges:  Prepandial:   less than 140 mg/dL      Peak postprandial:   less than 180 mg/dL (1-2 hours)      Critically ill patients:  140 - 180 mg/dL   Lab Results  Component Value Date   GLUCAP 256 (H) 02/21/2023   HGBA1C 10.2 (H) 02/20/2023    Review of Glycemic Control  Latest Reference Range & Units 02/20/23 06:40 02/20/23 12:25 02/20/23 18:16 02/20/23 21:05 02/21/23 04:31 02/21/23 07:53  Glucose-Capillary 70 - 99 mg/dL 409 (H) 811 (H) 914 (H) 434 (H) 376 (H) 256 (H)   Diabetes history: DM 2 Outpatient Diabetes medications:  Lantus 64 units q HS Humalog 10 units tid with meals Januvia 50 mg daily Current orders for Inpatient glycemic control:  Novolog 0-15 units tid with meals Semglee 64 units q HS Inpatient Diabetes Program Recommendations:    Please consider adding Novolog 5 units tid with meals (meal coverage - 5 units tid with meals).    Thanks,  Beryl Meager, RN, BC-ADM Inpatient Diabetes Coordinator Pager [phone] (8a-5p)

## 2023-02-22 DIAGNOSIS — I5033 Acute on chronic diastolic (congestive) heart failure: Secondary | ICD-10-CM | POA: Diagnosis not present

## 2023-02-22 LAB — CBC
HCT: 35.7 % — ABNORMAL LOW (ref 36.0–46.0)
Hemoglobin: 11.1 g/dL — ABNORMAL LOW (ref 12.0–15.0)
MCH: 29 pg (ref 26.0–34.0)
MCHC: 31.1 g/dL (ref 30.0–36.0)
MCV: 93.2 fL (ref 80.0–100.0)
Platelets: 180 10*3/uL (ref 150–400)
RBC: 3.83 MIL/uL — ABNORMAL LOW (ref 3.87–5.11)
RDW: 14.6 % (ref 11.5–15.5)
WBC: 9.6 10*3/uL (ref 4.0–10.5)
nRBC: 0 % (ref 0.0–0.2)

## 2023-02-22 LAB — BASIC METABOLIC PANEL
Anion gap: 11 (ref 5–15)
BUN: 78 mg/dL — ABNORMAL HIGH (ref 8–23)
CO2: 28 mmol/L (ref 22–32)
Calcium: 8.6 mg/dL — ABNORMAL LOW (ref 8.9–10.3)
Chloride: 102 mmol/L (ref 98–111)
Creatinine, Ser: 2.43 mg/dL — ABNORMAL HIGH (ref 0.44–1.00)
GFR, Estimated: 20 mL/min — ABNORMAL LOW (ref >60)
Glucose, Bld: 98 mg/dL (ref 70–99)
Potassium: 4.4 mmol/L (ref 3.5–5.1)
Sodium: 141 mmol/L (ref 135–145)

## 2023-02-22 LAB — GLUCOSE, CAPILLARY
Glucose-Capillary: 101 mg/dL — ABNORMAL HIGH (ref 70–99)
Glucose-Capillary: 84 mg/dL (ref 70–99)
Glucose-Capillary: 106 mg/dL — ABNORMAL HIGH (ref 70–99)
Glucose-Capillary: 155 mg/dL — ABNORMAL HIGH (ref 70–99)
Glucose-Capillary: 196 mg/dL — ABNORMAL HIGH (ref 70–99)

## 2023-02-22 MED ORDER — DILTIAZEM HCL ER COATED BEADS 180 MG PO CP24
180.0000 mg | ORAL_CAPSULE | Freq: Every day | ORAL | Status: DC
  Administered 2023-02-22 – 2023-02-23 (×2): 180 mg via ORAL
  Filled 2023-02-22 (×2): qty 1

## 2023-02-22 MED ORDER — INSULIN ASPART 100 UNIT/ML IJ SOLN
3.0000 [IU] | Freq: Three times a day (TID) | INTRAMUSCULAR | Status: DC
  Administered 2023-02-22 – 2023-02-23 (×4): 3 [IU] via SUBCUTANEOUS
  Filled 2023-02-22 (×3): qty 1

## 2023-02-22 MED ORDER — METOPROLOL TARTRATE 25 MG PO TABS
25.0000 mg | ORAL_TABLET | Freq: Two times a day (BID) | ORAL | Status: DC
  Administered 2023-02-22 – 2023-02-23 (×2): 25 mg via ORAL
  Filled 2023-02-22 (×2): qty 1

## 2023-02-22 MED ORDER — INSULIN GLARGINE-YFGN 100 UNIT/ML ~~LOC~~ SOLN
50.0000 [IU] | Freq: Every day | SUBCUTANEOUS | Status: DC
  Administered 2023-02-22: 50 [IU] via SUBCUTANEOUS
  Filled 2023-02-22 (×2): qty 0.5

## 2023-02-22 NOTE — Progress Notes (Signed)
Progress Note   Patient: Cheyenne Frank ZOX:[id number] DOB: Dec 11, 1940 DOA: 02/19/2023     2 DOS: the patient was seen and examined on 02/22/2023   Brief hospital course: Cheyenne Frank is a 82 y.o. Caucasian female with medical history significant for asthma, anxiety, type 2 diabetes mellitus, GERD, hypertension, OSA, RLS and CVA, who presented to the ER with acute onset of generalized weakness and palpitations.  She admitted to cough and wheezing without dyspnea.  She has been having lower extremity edema and dyspnea on exertion.  Her heart rate was in the 140s per EMS and systolic BP in the 80s with a blood glucose of 333.  No fever or chills.  No nausea or vomiting or abdominal pain.  No dysuria, oliguria or hematuria or flank pain.  She had a left eye procedure a couple days ago with residual left subconjunctival hemorrhage that she was told should resolve in the next couple of days.  No headache or dizziness or blurred vision.  No paresthesias or focal muscle weakness.   ED Course: Upon presentation to the emergency room, heart rate was 134 and vital signs were otherwise within normal.  Labs revealed sodium 134 potassium 5.6 and chloride 97.  Blood glucose was 313 with BUN of 50 and creatinine 2.38 above previous levels 44/1.68 on 11/18/2022 with a calcium 8.4 and total obtained 6.3 with albumin of 3.1.  BNP was 623.  Lactic acid was 1.8 and later 2.  CBC showed hemoglobin 11.7 hematocrit 37.3.  D-dimer was 1.06.   EKG: showed suspected atrial flutter with RVR of 135 with anteroseptal Q waves. Imaging: Chest CTA revealed no evidence for PE.  It showed mild diffuse interstitial thickening that may represent mild pulmonary edema and small bilateral pleural effusions, small pericardial effusion, her prior cholecystectomy and postoperative changes within the lower thoracic and upper lumbar spine as well as aortic atherosclerosis.   The patient was initially given 1 L bolus of IV normal saline and 125 mg  of IV Solu-Medrol as well as DuoNeb and IV Cardizem bolus of 15 mg followed by IV Cardizem drip.  She will be given 40 mg of IV Lasix.  She will be admitted to a progressive unit bed for further evaluation and management.       Assessment and Plan:  Acute on chronic diastolic dysfunction CHF Most likely triggered by rapid A-fib Last known LVEF of 70 to 75% with LVH and grade 2 diastolic dysfunction from a 2D echocardiogram which was done 12/23 Lasix was discontinued due to renal insufficiency Optimize blood pressure control Continue metoprolol Maintain low-salt diet Check daily weight       A-fib with rapid ventricular rate (paroxysmal) Currently off Cardizem drip Back in sinus rhythm Rate controlled on oral Cardizem and metoprolol Continue Eliquis as secondary prophylaxis for an acute stroke       TIA Patient with episodes of slurred speech which has resolved Code stroke was called and patient had a stat CT scan of the head without contrast which was negative for bleed MRI of the brain showed remote infarct with no acute intracranial abnormality Appreciate neurology input Optimize blood pressure control       Hyperkalemia Most likely secondary to AKI and ARB use Improved     Diabetes mellitus type 2 with hyperglycemia A1c of 10.2 consistent with poor control Continue long-acting insulin and NovoLog 5 units with meals     Acute kidney injury superimposed on stage IIIb chronic kidney disease Renal  function appears stable Monitor closely while on diuretic therapy Hold Cozaar       Depression Continue Paxil and trazodone       Morbid obesity BMI 45.31 Complicates overall prognosis and care Lifestyle modification and exercise has been discussed with patient in detail       Physical deconditioning PT evaluation              Subjective: Patient is seen and examined at the bedside.  No new complaints  Physical Exam: Vitals:   02/22/23 0342  02/22/23 0750 02/22/23 0959 02/22/23 1155  BP: 126/81 (!) 109/91    Pulse: 63 63    Resp: (!) 21 18    Temp: 97.8 F (36.6 C) 98 F (36.7 C)    TempSrc:      SpO2: 97% 94%  100%  Weight:   113.1 kg   Height:       General: Elderly female, well nourished, in no acute distress.  Sitting upright in bed eating lunch with caretaker and caretaker's sister HEENT:  Normocephalic and atraumatic. Left eye subconjunctival hemorrhage present on arrival from recent eye injection 5/31 Neck:   No JVD.  Lungs: Normal respiratory effort on room air.  Decreased breath sounds with trace expiratory wheezes.   Heart: regular rate and rhythm. Normal S1 and S2 without gallops or murmurs.  Abdomen: Mildly distended appearing with excess adiposity, nontender to palpation.  Much improved redness and erythema with whitish discharge over lower abdominal folds bilaterally Msk: Normal strength and tone for age. Extremities: Warm and well perfused. No clubbing, cyanosis.  Trace bilateral lower extremity edema with chronic appearing hyperpigmentation.  Neuro: Awake and alert, answer simple questions appropriately.   Psych: Pleasant and cooperative.   Data Reviewed: Labs reviewed.  Creatinine 2.43 There are no new results to review at this time.  Family Communication: Plan of care discussed with patient and her caregiver at the bedside.  Dispo dependent on PT evaluation  Disposition: Status is: Inpatient Remains inpatient appropriate because: Discharge planning  Planned Discharge Destination:  TBD    Time spent: 33 minutes  Author: Lucile Shutters, MD 02/22/2023 12:42 PM  For on call review [email or web address].

## 2023-02-22 NOTE — TOC Initial Note (Addendum)
Transition of Care [facility]) - Initial/Assessment Note    Patient Details  Name: Cheyenne Frank MRN: [id number] Date of Birth: 09/19/1940  Transition of Care [facility]) CM/SW Contact:    Margarito Liner, LCSW Phone Number: 02/22/2023, 10:39 AM  Clinical Narrative:  Readmission prevention screen complete. CSW met with patient. Caregiver at bedside. CSW introduced role and explained that discharge planning would be discussed. PCP is at [facility] in [place]. She does not see a specific provider there. Daughter drives her to appointments. Pharmacy is [facility] in [place]. No issues obtaining medications. Patient lives home with her daughter, son-in-law, and granddaughter. No home health prior to admission although she does have hired caregivers. Caregiver at bedside stated they are with her almost 24 hours per day. Patient has a hospital bed, 3-in-1 (that she uses as an elevated toilet seat), walk-in tub, RW, and wheelchair. PT evaluation is pending. Will follow up once recommendations are made. No further concerns. CSW will continue to follow patient for support and facilitate discharge once medically stable. If she returns home, daughter will transport her.                2:37 pm: Patient does not have home health agency preference. She was recently discharged from [facility]. CSW called daughter [name] who would like to use them again. Referral accepted for PT. Per MD, will discharge in the morning. Daughter [name] will transport her home.  Expected Discharge Plan:  (TBD) Barriers to Discharge: Continued Medical Work up   Patient Goals and CMS Choice            Expected Discharge Plan and Services     Post Acute Care Choice:  (TBD) Living arrangements for the past 2 months: Single Family Home                                      Prior Living Arrangements/Services Living arrangements for the past 2 months: Single Family Home Lives with:: Adult Children, Relatives Patient  language and need for interpreter reviewed:: Yes Do you feel safe going back to the place where you live?: Yes      Need for Family Participation in Patient Care: Yes (Comment) Care giver support system in place?: Yes (comment) Current home services: DME, Homehealth aide Criminal Activity/Legal Involvement Pertinent to Current Situation/Hospitalization: No - Comment as needed  Activities of Daily Living      Permission Sought/Granted                  Emotional Assessment Appearance:: Appears stated age Attitude/Demeanor/Rapport: Engaged, Gracious Affect (typically observed): Accepting, Appropriate, Calm, Pleasant Orientation: : Oriented to Self, Oriented to Place, Oriented to  Time, Oriented to Situation Alcohol / Substance Use: Not Applicable Psych Involvement: No (comment)  Admission diagnosis:  Chronic diastolic heart failure (HCC) [I50.32] Acute pulmonary edema (HCC) [J81.0] Atrial flutter with rapid ventricular response (HCC) [I48.92] Acute respiratory failure with hypoxia (HCC) [J96.01] Generalized weakness [R53.1] Acute on chronic diastolic (congestive) heart failure (HCC) [I50.33] Acute renal failure superimposed on chronic kidney disease, unspecified acute renal failure type, unspecified CKD stage (HCC) [N17.9, N18.9] Patient Active Problem List   Diagnosis Date Noted   Acute on chronic diastolic (congestive) heart failure (HCC) 02/20/2023   Acute kidney injury superimposed on chronic kidney disease (HCC) 02/20/2023   Type 2 diabetes mellitus with peripheral neuropathy (HCC) 02/20/2023   Dyslipidemia 02/20/2023   Hyperkalemia  02/20/2023   Atrial fibrillation with rapid ventricular response (HCC) 11/16/2022   Atrial fibrillation (HCC) 11/16/2022   Chest pain 09/09/2022   Elevated serum creatinine 09/09/2022   Polyuria 09/09/2022   Elevated troponin 09/09/2022   Acute CVA (cerebrovascular accident) (HCC) 07/17/2022   CKD stage 3 due to type 2 diabetes mellitus  (HCC) 07/17/2022   Postlaminectomy syndrome 02/05/2022   Dementia without behavioral disturbance (HCC) 02/02/2022   Acute metabolic encephalopathy 02/01/2022   Seizure disorder (HCC) 01/30/2022   Aphasia 01/26/2022   Hypertensive urgency 01/24/2022   Paroxysmal atrial fibrillation (HCC) 01/24/2022   History of CVA (cerebrovascular accident) 01/24/2022   Hoarseness of voice 10/27/2021   Chronic diastolic CHF (congestive heart failure) (HCC) 10/26/2021   Acute hypoxemic respiratory failure (HCC) 10/26/2021   Atrial flutter with rapid ventricular response (HCC) 09/16/2021   Stage 3b chronic kidney disease (CKD) (HCC) 09/16/2021   TIA (transient ischemic attack) 09/16/2021   COPD (chronic obstructive pulmonary disease) (HCC) 09/16/2021   Depression 09/16/2021   Acute diastolic CHF (congestive heart failure) (HCC)    AKI (acute kidney injury) (HCC)    Obesity, Class III, BMI 40-49.9 (morbid obesity) (HCC)    Atrial fibrillation with RVR (HCC) 05/16/2021   Head injury 12/27/2020   SDH (subdural hematoma) (HCC) 12/11/2013   CVA (cerebral infarction) 12/11/2013   Uncontrolled type 2 diabetes mellitus with hyperglycemia, with long-term current use of insulin (HCC) 04/07/2013   Essential hypertension 04/07/2013   Sleep apnea 04/07/2013   OA (osteoarthritis) of knee 04/06/2013   PCP:  [facility]:   [facility] [address] [place] - [address] [place] [address] Phone: [phone] Fax: [phone]     Social Determinants of Health (SDOH) Social History: SDOH Screenings   Food Insecurity: No Food Insecurity (11/16/2022)  Housing: Low Risk  (11/16/2022)  Transportation Needs: No Transportation Needs (11/16/2022)  Utilities: Not At Risk (11/16/2022)  Tobacco Use: Low Risk  (11/16/2022)   SDOH Interventions:     Readmission Risk Interventions    02/22/2023   10:37 AM 07/18/2022    3:27 PM 01/28/2022    2:28 PM  Readmission  Risk Prevention Plan  Transportation Screening Complete Complete Complete  PCP or Specialist Appt within 3-5 Days   Complete  HRI or Home Care Consult   Complete  Social Work Consult for Recovery Care Planning/Counseling   Complete  Palliative Care Screening   Complete  Medication Review Oceanographer) Complete Complete Complete  PCP or Specialist appointment within 3-5 days of discharge Complete Complete   HRI or Home Care Consult  Complete   SW Recovery Care/Counseling Consult Complete Not Complete   SW Consult Not Complete Comments  NA   Palliative Care Screening Not Applicable Not Applicable   Skilled Nursing Facility Not Applicable Not Applicable

## 2023-02-22 NOTE — Progress Notes (Addendum)
Inpatient Diabetes Program Recommendations  AACE/ADA: New Consensus Statement on Inpatient Glycemic Control (2015)  Target Ranges:  Prepandial:   less than 140 mg/dL      Peak postprandial:   less than 180 mg/dL (1-2 hours)      Critically ill patients:  140 - 180 mg/dL   Lab Results  Component Value Date   GLUCAP 84 02/22/2023   HGBA1C 10.2 (H) 02/20/2023    Review of Glycemic Control  Latest Reference Range & Units 02/21/23 07:53 02/21/23 12:12 02/21/23 16:06 02/21/23 21:16 02/22/23 03:42 02/22/23 07:53  Glucose-Capillary 70 - 99 mg/dL 865 (H) 784 (H) 696 (H) 105 (H) 101 (H) 84   Diabetes history: DM 2 Outpatient Diabetes medications:  Lantus 64 units q HS Humalog 10 units tid with meals Januvia 50 mg daily Current orders for Inpatient glycemic control:  Novolog 0-15 units tid with meals Semglee 64 units q HS Novolog 5 units tid withmeals  Inpatient Diabetes Program Recommendations:    Blood sugars improved.  Consider reducing Semglee to 55 units q HS and reduce Novolog meal coverage to 3 units tid with meals.    Addendum 1300- briefly spoke with caregiver at bedside.  She states that caregivers assist with insulin and helping patient check blood sugars at home.  Told patient and caregiver current A1C of 10.2% (which is increased).  Needs close f/u with PCP.  Daughter states that she usually is in the 200's most of the time.  She states that mother is immobile and does have treats sometimes.  Recommended that she eliminate sugar from beverages (like regular sodas/sweet tea) and for patient to replace with water/diet soda.  May be candidate for a CGM for monitoring.  Told daughter to speak with patient's PCP about whether this is appropriate.  Daughter appreciative of call.   Thanks,  Beryl Meager, RN, BC-ADM Inpatient Diabetes Coordinator Pager [phone]  (8a-5p)

## 2023-02-22 NOTE — Care Management Important Message (Signed)
Important Message  Patient Details  Name: TOSHA BELGARDE MRN: [id number] Date of Birth: [birth year]   Medicare Important Message Given:  Yes  Reviewed Medicare IM with patient, initial consent obtained.  Copy of Medicare IM left in room for reference, original to be scanned into the chart.    [name] 02/22/2023, 1:37 PM

## 2023-02-22 NOTE — Evaluation (Signed)
Physical Therapy Evaluation Patient Details Name: Cheyenne Frank MRN: [id number] DOB: Jun 24, 1941 Today's Date: 02/22/2023  History of Present Illness  Cheyenne Frank is a 82 y.o. Caucasian female with medical history significant for asthma, anxiety, type 2 diabetes mellitus, GERD, hypertension, OSA, RLS and CVA, who presented to the ER with acute onset of generalized weakness and palpitations.  She admitted to cough and wheezing without dyspnea.  She has been having lower extremity edema and dyspnea on exertion.  Her heart rate was in the 140s per EMS and systolic BP in the 80s with a blood glucose of 333.  No fever or chills.  No nausea or vomiting or abdominal pain.  No dysuria, oliguria or hematuria or flank pain.  She had a left eye procedure a couple days ago with residual left subconjunctival hemorrhage that she was told should resolve in the next couple of days.  No headache or dizziness or blurred vision.  No paresthesias or focal muscle weakness.    Clinical Impression  Pt asleep upon arrival but easily awakened.  Pt noted to be fatigued, but ready to begin therapy.  Pt able to perform transfers with minA from therapist with HHA for coming into seated position.  Pt then does well with transitioning to standing and ambulating out of the room.  Pt does fatigue quickly and is only able to ambulate to the nursing station before needing to return.  Pt able to ambulate household distances at this time.  Pt returned to bed with all needs met and min-modA necessary for bringing LE's back into bed.  Pt left with call bell in reach.       Recommendations for follow up therapy are one component of a multi-disciplinary discharge planning process, led by the attending physician.  Recommendations may be updated based on patient status, additional functional criteria and insurance authorization.     Assistance Recommended at Discharge PRN  Patient can return home with the following  A little help with  walking and/or transfers;A little help with bathing/dressing/bathroom;Assist for transportation;Help with stairs or ramp for entrance    Equipment Recommendations None recommended by PT  Recommendations for Other Services       Functional Status Assessment Patient has had a recent decline in their functional status and demonstrates the ability to make significant improvements in function in a reasonable and predictable amount of time.     Precautions / Restrictions Restrictions Weight Bearing Restrictions: No      Mobility  Bed Mobility Overal bed mobility: Needs Assistance Bed Mobility: Supine to Sit, Sit to Supine     Supine to sit: Min assist Sit to supine: Min assist, Mod assist   General bed mobility comments: minA with HHA to come into seated position and min-modA necessary for getting LE's back into the bed.    Transfers Overall transfer level: Needs assistance Equipment used: Rolling walker (2 wheels) Transfers: Sit to/from Stand Sit to Stand: Min guard           General transfer comment: Pt with good transfer from bed to come into standing position.    Ambulation/Gait Ambulation/Gait assistance: Min guard Gait Distance (Feet): 70 Feet Assistive device: Rolling walker (2 wheels) Gait Pattern/deviations: WFL(Within Functional Limits) Gait velocity: decreased     General Gait Details: pt with slow but steady gait mechanics.  Stairs            Wheelchair Mobility    Modified Rankin (Stroke Patients Only)  Balance Overall balance assessment: Mild deficits observed, not formally tested                                           Pertinent Vitals/Pain Pain Assessment Pain Assessment: No/denies pain    Home Living Family/patient expects to be discharged to:: Private residence Living Arrangements: Children Available Help at Discharge: Family;Available 24 hours/day Type of Home: House Home Access: Ramped entrance        Home Layout: Two level;Able to live on main level with bedroom/bathroom Home Equipment: Rolling Walker (2 wheels);Wheelchair - manual;BSC/3in1;Shower seat - built in;Other (comment);Hospital bed Additional Comments: Hospital bed, trapeze bar,    Prior Function Prior Level of Function : Needs assist             Mobility Comments: daughter reports pt primarily uses w/c at home, has been working on walking with RW & HHPT once/week, requires assistance for stand pivot transfers, intermittent assistance for bed mobility even with hospital bed & trapeze bar ADLs Comments: wears depends, assistance for peri hygiene & bathing/dressing.     Hand Dominance   Dominant Hand: Right    Extremity/Trunk Assessment   Upper Extremity Assessment Upper Extremity Assessment: Overall WFL for tasks assessed    Lower Extremity Assessment Lower Extremity Assessment: Generalized weakness       Communication   Communication: HOH  Cognition Arousal/Alertness: Awake/alert Behavior During Therapy: WFL for tasks assessed/performed Overall Cognitive Status: Within Functional Limits for tasks assessed                                          General Comments      Exercises     Assessment/Plan    PT Assessment Patient needs continued PT services  PT Problem List Decreased strength;Decreased activity tolerance;Decreased balance;Decreased mobility       PT Treatment Interventions DME instruction;Gait training;Functional mobility training;Therapeutic activities;Therapeutic exercise;Balance training;Neuromuscular re-education    PT Goals (Current goals can be found in the Care Plan section)  Acute Rehab PT Goals Patient Stated Goal: to go back home. PT Goal Formulation: With patient Time For Goal Achievement: 03/08/23 Potential to Achieve Goals: Good    Frequency Min 2X/week     Co-evaluation               AM-PAC PT "6 Clicks" Mobility  Outcome Measure Help  needed turning from your back to your side while in a flat bed without using bedrails?: A Little Help needed moving from lying on your back to sitting on the side of a flat bed without using bedrails?: A Little Help needed moving to and from a bed to a chair (including a wheelchair)?: A Little Help needed standing up from a chair using your arms (e.g., wheelchair or bedside chair)?: A Little Help needed to walk in hospital room?: A Little Help needed climbing 3-5 steps with a railing? : A Lot 6 Click Score: 17    End of Session Equipment Utilized During Treatment: Gait belt Activity Tolerance: Patient tolerated treatment well Patient left: in bed;with call bell/phone within reach;with bed alarm set Nurse Communication: Mobility status PT Visit Diagnosis: Unsteadiness on feet (R26.81);Other abnormalities of gait and mobility (R26.89);Muscle weakness (generalized) (M62.81);Difficulty in walking, not elsewhere classified (R26.2)    Time: 1478-2956 PT Time  Calculation (min) (ACUTE ONLY): 24 min   Charges:   PT Evaluation $PT Eval Low Complexity: 1 Low PT Treatments $Therapeutic Activity: 8-22 mins        Nolon Bussing, PT, DPT Physical Therapist - [place]  [facility]  02/22/23, 1:53 PM

## 2023-02-22 NOTE — Progress Notes (Addendum)
[facility] CLINIC CARDIOLOGY CONSULT NOTE       Patient ID: JAKEA WILLETT MRN: [id number] DOB/AGE: 1941/01/03 82 y.o.  Admit date: 02/19/2023 Referring Physician Dr. Valente David Primary Physician Dr. Laurine Blazer  Primary Cardiologist Dr. Darrold Junker (last seen 2022)  Reason for Consultation AF RVR  HPI: Gale B. Haithcock is an 51yoF with a PMH of paroxymal atrial flutter (eliquis), DM2, HTN, CKD, hx CVA, morbid obesity (BMI 45 kg/m), memory impairment who presented to [facility] ED 02/19/2023 with generalized weakness and palpitations.  Found to be in atrial fibrillation with RVR and hypotensive to the 80s per EMS.  Cardiology is consulted for further assistance with her heart failure.  Interval History:  -sitting up in bed eating breakfast - feels like "she's been run over" and continues to have a productive cough and wheezing - no chest pain or palpitations - converted from AFL RVR to NSR with rate in the 60s-70s overnight   Review of systems complete and found to be negative unless listed above     Past Medical History:  Diagnosis Date   AKI (acute kidney injury) (HCC)    Anemia of chronic disease    Anxiety    Arthritis    Asthma    no problems since 1999 (after moving into a new home)   Chronic airway obstruction (HCC)    Complication of anesthesia    " feel like I'm dying after I wake up"   Diabetes mellitus without complication (HCC)    Diverticulosis    Dysrhythmia    unknown type    Fibromyalgia    Flat back syndrome    GERD (gastroesophageal reflux disease)    Hx of transfusion 2011   Hypertension    Itching    Macular degeneration    Nocturia    Postoperative anemia due to acute blood loss 04/09/2013   Restless leg syndrome    Sleep apnea    Dx 1990's unable to wear c-pap -   Stroke (HCC) 2010   verbal aphasia x 30 min - resolved - no problem since then    Past Surgical History:  Procedure Laterality Date   ABDOMINAL HYSTERECTOMY     rt so   BACK SURGERY  2010  / 2011   BREAST REDUCTION SURGERY     BURR HOLE FOR SUBDURAL HEMATOMA  2015   cataracts removed     CHOLECYSTECTOMY  1990   DILATION AND CURETTAGE OF UTERUS     HERNIA REPAIR     POSTERIOR LAMINECTOMY / DECOMPRESSION LUMBAR SPINE     with T10-S1 fusion   TONSILLECTOMY     TOTAL KNEE ARTHROPLASTY Left 04/06/2013   Procedure: LEFT TOTAL KNEE ARTHROPLASTY;  Surgeon: Loanne Drilling, MD;  Location: [facility] ORS;  Service: Orthopedics;  Laterality: Left;    Medications Prior to Admission  Medication Sig Dispense Refill Last Dose   acetaminophen (TYLENOL) 500 MG tablet Take 500-1,000 mg by mouth every 6 (six) hours as needed for mild pain or fever.    unk at unk   albuterol (VENTOLIN HFA) 108 (90 Base) MCG/ACT inhaler Inhale 2 puffs into the lungs every 6 (six) hours as needed for wheezing or shortness of breath.   unk at unk   apixaban (ELIQUIS) 5 MG TABS tablet Take 1 tablet (5 mg total) by mouth 2 (two) times daily. 60 tablet 0 02/19/2023   aspirin EC 81 MG tablet Take 1 tablet (81 mg total) by mouth daily. MAY RESUME ON 12/31/20 (Patient  taking differently: Take 81 mg by mouth daily.) 30 tablet 11 02/19/2023   carvedilol (COREG) 25 MG tablet Take 25 mg by mouth 2 (two) times daily.    02/19/2023   diltiazem (CARDIZEM CD) 120 MG 24 hr capsule Take 1 capsule (120 mg total) by mouth daily. 30 capsule 11 02/19/2023   gabapentin (NEURONTIN) 300 MG capsule Take 1 capsule (300 mg total) by mouth 2 (two) times daily. 6 capsule 0 02/19/2023   hydrALAZINE (APRESOLINE) 25 MG tablet Take 1 tablet (25 mg total) by mouth 3 (three) times daily as needed (For systolic above 160). 30 tablet 0 unk at unk   insulin glargine (LANTUS) 100 UNIT/ML injection Inject 0.64 mLs (64 Units total) into the skin at bedtime.   02/18/2023   insulin lispro (HUMALOG) 100 UNIT/ML injection Inject 0.1 mLs (10 Units total) into the skin 3 (three) times daily with meals.   02/19/2023   losartan (COZAAR) 25 MG tablet Take 25 mg by mouth daily.   02/19/2023    Multiple Vitamin (MULTIVITAMIN WITH MINERALS) TABS tablet Take 1 tablet by mouth daily.   02/19/2023   Multiple Vitamins-Minerals (ICAPS AREDS 2 PO) Take 1 capsule by mouth in the morning and at bedtime.   02/19/2023   omeprazole (PRILOSEC) 40 MG capsule Take 40 mg by mouth daily.    02/19/2023   oxybutynin (DITROPAN-XL) 10 MG 24 hr tablet Take 10 mg by mouth at bedtime.   02/19/2023   PARoxetine (PAXIL) 40 MG tablet Take 1 tablet (40 mg total) by mouth at bedtime. 6 tablet 0 02/19/2023   rosuvastatin (CRESTOR) 5 MG tablet Take 2.5 mg by mouth at bedtime.   02/19/2023   sitaGLIPtin (JANUVIA) 50 MG tablet Take 50 mg by mouth daily.   02/19/2023   torsemide (DEMADEX) 20 MG tablet Take 1 tablet (20 mg total) by mouth daily. Please start from 11/01/2021 90 tablet 1 02/19/2023   traZODone (DESYREL) 50 MG tablet Take 1.5 tablets (75 mg total) by mouth at bedtime.   02/19/2023   Social History   Socioeconomic History   Marital status: Single    Spouse name: Not on file   Number of children: Not on file   Years of education: Not on file   Highest education level: Not on file  Occupational History   Not on file  Tobacco Use   Smoking status: Never   Smokeless tobacco: Never  Substance and Sexual Activity   Alcohol use: No    Alcohol/week: 0.0 standard drinks of alcohol   Drug use: No   Sexual activity: Not Currently  Other Topics Concern   Not on file  Social History Narrative   Not on file   Social Determinants of Health   Financial Resource Strain: Not on file  Food Insecurity: No Food Insecurity (11/16/2022)   Hunger Vital Sign    Worried About Running Out of Food in the Last Year: Never true    Ran Out of Food in the Last Year: Never true  Transportation Needs: No Transportation Needs (11/16/2022)   PRAPARE - Administrator, Civil Service (Medical): No    Lack of Transportation (Non-Medical): No  Physical Activity: Not on file  Stress: Not on file  Social Connections: Not on file   Intimate Partner Violence: Not At Risk (11/16/2022)   Humiliation, Afraid, Rape, and Kick questionnaire    Fear of Current or Ex-Partner: No    Emotionally Abused: No    Physically Abused: No  Sexually Abused: No    Family History  Problem Relation Age of Onset   COPD Sister        had lung transplant   COPD Brother       Intake/Output Summary (Last 24 hours) at 02/22/2023 1111 Last data filed at 02/22/2023 0630 Gross per 24 hour  Intake 240 ml  Output 300 ml  Net -60 ml     Vitals:   02/22/23 0030 02/22/23 0342 02/22/23 0750 02/22/23 0959  BP: 121/68 126/81 (!) 109/91   Pulse: 61 63 63   Resp: 18 (!) 21 18   Temp: 98.1 F (36.7 C) 97.8 F (36.6 C) 98 F (36.7 C)   TempSrc:      SpO2: 90% 97% 94%   Weight:    113.1 kg  Height:        PHYSICAL EXAM General: Elderly female, well nourished, in no acute distress.  Sitting upright in bed eating lunch with caretaker and caretaker's sister HEENT:  Normocephalic and atraumatic. Left eye subconjunctival hemorrhage present on arrival from recent eye injection 5/31 Neck:  No JVD.  Lungs: Normal respiratory effort on room air.  Decreased breath sounds with trace expiratory wheezes.   Heart: regular rate and rhythm. Normal S1 and S2 without gallops or murmurs.  Abdomen: Mildly distended appearing with excess adiposity, nontender to palpation.  Much improved redness and erythema with whitish discharge over lower abdominal folds bilaterally Msk: Normal strength and tone for age. Extremities: Warm and well perfused. No clubbing, cyanosis.  Trace bilateral lower extremity edema with chronic appearing hyperpigmentation.  Neuro: Awake and alert, answer simple questions appropriately.   Psych: Pleasant and cooperative.  Labs: Basic Metabolic Panel: Recent Labs    02/21/23 0929 02/22/23 0433  NA 139 141  K 5.1 4.4  CL 100 102  CO2 28 28  GLUCOSE 267* 98  BUN 74* 78*  CREATININE 2.29* 2.43*  CALCIUM 8.6* 8.6*  MG 2.3  --      Liver Function Tests: Recent Labs    02/19/23 2106  AST 31  ALT 15  ALKPHOS 92  BILITOT 1.0  PROT 6.3*  ALBUMIN 3.1*    No results for input(s): "LIPASE", "AMYLASE" in the last 72 hours. CBC: Recent Labs    02/19/23 2106 02/20/23 0619 02/21/23 0929 02/22/23 0433  WBC 7.6   < > 10.0 9.6  NEUTROABS 5.1  --   --   --   HGB 11.7*   < > 10.8* 11.1*  HCT 37.3   < > 33.9* 35.7*  MCV 92.6   < > 91.9 93.2  PLT 157   < > 157 180   < > = values in this interval not displayed.    Cardiac Enzymes: No results for input(s): "CKTOTAL", "CKMB", "CKMBINDEX", "TROPONINIHS" in the last 72 hours. BNP: Recent Labs    02/19/23 2110  BNP 623.0*    D-Dimer: Recent Labs    02/19/23 2110  DDIMER 1.06*    Hemoglobin A1C: Recent Labs    02/20/23 0619  HGBA1C 10.2*    Fasting Lipid Panel: No results for input(s): "CHOL", "HDL", "LDLCALC", "TRIG", "CHOLHDL", "LDLDIRECT" in the last 72 hours. Thyroid Function Tests: No results for input(s): "TSH", "T4TOTAL", "T3FREE", "THYROIDAB" in the last 72 hours.  Invalid input(s): "FREET3" Anemia Panel: No results for input(s): "VITAMINB12", "FOLATE", "FERRITIN", "TIBC", "IRON", "RETICCTPCT" in the last 72 hours.   Radiology: MR BRAIN WO CONTRAST  Result Date: 02/20/2023 CLINICAL DATA:  Neuro deficit, acute, stroke  suspected EXAM: MRI HEAD WITHOUT CONTRAST TECHNIQUE: Multiplanar, multiecho pulse sequences of the brain and surrounding structures were obtained without intravenous contrast. COMPARISON:  CT head June 5, 24. FINDINGS: Brain: No acute infarction, acute hemorrhage, hydrocephalus, extra-axial collection or mass lesion. Remote infarcts in the left internal capsule, occipital lobe and bilateral cerebellum. Vascular: Major arterial flow voids are maintained at the skull base. Skull and upper cervical spine: Normal marrow signal. Sinuses/Orbits: Clear sinuses.  No acute orbital findings. Other: No mastoid effusions. IMPRESSION: 1. No  evidence of acute intracranial abnormality. 2. Remote infarcts. Electronically Signed   By: Feliberto Harts M.D.   On: 02/20/2023 11:58   CT HEAD CODE STROKE WO CONTRAST`  Result Date: 02/20/2023 CLINICAL DATA:  Code stroke.  Worsening speech EXAM: CT HEAD WITHOUT CONTRAST TECHNIQUE: Contiguous axial images were obtained from the base of the skull through the vertex without intravenous contrast. RADIATION DOSE REDUCTION: This exam was performed according to the departmental dose-optimization program which includes automated exposure control, adjustment of the mA and/or kV according to patient size and/or use of iterative reconstruction technique. COMPARISON:  11/16/2022 FINDINGS: Brain: Chronic bilateral cerebellar, left occipital, and left low internal capsule infarcts. Lacunar infarct in the right centrum semiovale. Cerebral volume loss which is generalized. No evidence of acute infarct. No hemorrhage, hydrocephalus, or masslike finding Vascular: No hyperdense vessel when allowing for streak artifact in the posterior fossa. Skull: High left parietal burr hole Sinuses/Orbits: Negative ASPECTS (Alberta Stroke Program Early CT Score) - Ganglionic level infarction (caudate, lentiform nuclei, internal capsule, insula, M1-M3 cortex): 7 - Supraganglionic infarction (M4-M6 cortex): 3 Total score (0-10 with 10 being normal): 10 IMPRESSION: Chronic infarcts primarily in the posterior circulation. No acute or focal finding. Electronically Signed   By: Tiburcio Pea M.D.   On: 02/20/2023 07:02   CT Angio Chest PE W and/or Wo Contrast  Result Date: 02/19/2023 CLINICAL DATA:  Weakness and hypotension. EXAM: CT ANGIOGRAPHY CHEST WITH CONTRAST TECHNIQUE: Multidetector CT imaging of the chest was performed using the standard protocol during bolus administration of intravenous contrast. Multiplanar CT image reconstructions and MIPs were obtained to evaluate the vascular anatomy. RADIATION DOSE REDUCTION: This exam was  performed according to the departmental dose-optimization program which includes automated exposure control, adjustment of the mA and/or kV according to patient size and/or use of iterative reconstruction technique. CONTRAST:  75mL OMNIPAQUE IOHEXOL 350 MG/ML SOLN COMPARISON:  May 16, 2021 FINDINGS: Cardiovascular: There is moderate severity calcification of the aortic arch, without evidence of aortic aneurysm. Satisfactory opacification of the pulmonary arteries to the segmental level. No evidence of pulmonary embolism. Normal heart size. A small pericardial effusion is seen. Mediastinum/Nodes: No enlarged mediastinal, hilar, or axillary lymph nodes. Thyroid gland, trachea, and esophagus demonstrate no significant findings. Lungs/Pleura: Mild diffuse interstitial thickening is noted. Mild atelectasis is seen along the posterior aspects of the right upper lobe and bilateral lower lobes. There are small bilateral pleural effusions. No pneumothorax is identified. Upper Abdomen: Multiple surgical clips are seen within the gallbladder fossa. Musculoskeletal: Multiple tortuous venous varices are seen within the subcutaneous fat of the anterior left chest wall. Multilevel degenerative changes seen throughout the thoracic spine with postoperative changes also seen within the lower thoracic and upper lumbar spine. Review of the MIP images confirms the above findings. IMPRESSION: 1. No evidence of pulmonary embolism. 2. Mild diffuse interstitial thickening which may represent mild pulmonary edema. 3. Small bilateral pleural effusions. 4. Small pericardial effusion. 5. Evidence of prior cholecystectomy. 6. Postoperative  changes within the lower thoracic and upper lumbar spine. 7. Aortic atherosclerosis. Aortic Atherosclerosis (ICD10-I70.0). Electronically Signed   By: Aram Candela M.D.   On: 02/19/2023 22:56   DG Chest Port 1 View  Result Date: 02/19/2023 CLINICAL DATA:  [id number] Wheezing on expiration [id number] EXAM:  PORTABLE CHEST - 1 VIEW COMPARISON:  11/15/2022 FINDINGS: Lungs are clear. Heart size upper limits normal. There is blunting of the left lateral costophrenic angle suggesting small effusion. Thoracolumbar fixation hardware partially visualized. Bilateral shoulder DJD. IMPRESSION: Possible small left effusion. Electronically Signed   By: Corlis Leak M.D.   On: 02/19/2023 21:41    ECHO 08/2022  1. Left ventricular ejection fraction, by estimation, is 70 to 75%. The  left ventricle has hyperdynamic function. The left ventricle has no  regional wall motion abnormalities. There is moderate concentric left  ventricular hypertrophy. Left ventricular  diastolic parameters are consistent with Grade II diastolic dysfunction  (pseudonormalization).   2. Right ventricular systolic function is normal. The right ventricular  size is normal.   3. Left atrial size was mildly dilated.   4. Right atrial size was mildly dilated.   5. The mitral valve is normal in structure. Mild mitral valve  regurgitation. No evidence of mitral stenosis.   6. The aortic valve is normal in structure. Aortic valve regurgitation is  mild. Aortic valve sclerosis/calcification is present, without any  evidence of aortic stenosis.   7. The inferior vena cava is normal in size with greater than 50%  respiratory variability, suggesting right atrial pressure of 3 mmHg.   TELEMETRY reviewed by me (LT) 02/22/2023 : NSR 60s-70s  EKG reviewed by me: AFL rate 60s without concerning ST changes  Data reviewed by me (LT) 02/22/2023: hospitalist progress note, last 24h vitals tele labs imaging I/O   Principal Problem:   Acute on chronic diastolic (congestive) heart failure (HCC) Active Problems:   Uncontrolled type 2 diabetes mellitus with hyperglycemia, with long-term current use of insulin (HCC)   Essential hypertension   Atrial flutter with rapid ventricular response (HCC)   Stage 3b chronic kidney disease (CKD) (HCC)   Depression    Acute kidney injury superimposed on chronic kidney disease (HCC)   Dyslipidemia   Hyperkalemia    ASSESSMENT AND PLAN:  Magally B. Alwood is an 49yoF with a PMH of , paroxymal atrial flutter (eliquis), DM2, HTN, CKD, hx CVA, morbid obesity (BMI 45 kg/m), memory impairment who presented to [facility] ED 02/19/2023 with generalized weakness and palpitations.  Found to be in atrial fibrillation with RVR and hypotensive to the 80s per EMS.  Cardiology is consulted for further assistance with her heart failure.  # Generalized weakness # Wheezing # intertrigo  Reportedly cough and wheezing for up to 1 month recently per the patient's caretaker. Redness and irritation present along lower abdominal folds, improved after topical ketoconazole - further management per primary team   # Paroxysmal atrial flutter with RVR Converted to NSR overnight early AM 6/7. Rate controlled in 60s-70s on tele  -S/p diltiazem drip -consolidate short acting diltiazem to 60 mg q6h to cardizem CD 180mg  daily -consolidate metoprolol tartrate to 25mg  BID  -Stop Coreg 25 mg twice daily as metop started above  -Continue Eliquis 2.5 mg twice daily for stroke prevention (age >49, creatinine >1.5)  # Acute on chronic HFpEF Difficult to assess volume status due to body habitus. -hold further diuresis  -Monitor renal function and potassium closely -Continue GDMT with metoprolol, further additions limited by renal  insufficiency  # AKI on CKD - continue to monitor closely   Cardiology will sign off. Please haiku with questions or re-engage if needed.    This patient's plan of care was discussed and created with Dr. Darrold Junker and he is in agreement.  Signed: Rebeca Allegra , PA-C 02/22/2023, 11:11 AM [facility]

## 2023-02-23 ENCOUNTER — Inpatient Hospital Stay: Payer: Medicare Other

## 2023-02-23 DIAGNOSIS — I5033 Acute on chronic diastolic (congestive) heart failure: Secondary | ICD-10-CM | POA: Diagnosis not present

## 2023-02-23 DIAGNOSIS — J209 Acute bronchitis, unspecified: Secondary | ICD-10-CM | POA: Diagnosis present

## 2023-02-23 LAB — CBC
HCT: 33.5 % — ABNORMAL LOW (ref 36.0–46.0)
Hemoglobin: 10.5 g/dL — ABNORMAL LOW (ref 12.0–15.0)
MCH: 29 pg (ref 26.0–34.0)
MCHC: 31.3 g/dL (ref 30.0–36.0)
MCV: 92.5 fL (ref 80.0–100.0)
Platelets: 128 10*3/uL — ABNORMAL LOW (ref 150–400)
RBC: 3.62 MIL/uL — ABNORMAL LOW (ref 3.87–5.11)
RDW: 14.5 % (ref 11.5–15.5)
WBC: 7.1 10*3/uL (ref 4.0–10.5)
nRBC: 0 % (ref 0.0–0.2)

## 2023-02-23 LAB — BASIC METABOLIC PANEL
Anion gap: 10 (ref 5–15)
BUN: 79 mg/dL — ABNORMAL HIGH (ref 8–23)
CO2: 29 mmol/L (ref 22–32)
Calcium: 8.5 mg/dL — ABNORMAL LOW (ref 8.9–10.3)
Chloride: 100 mmol/L (ref 98–111)
Creatinine, Ser: 2.47 mg/dL — ABNORMAL HIGH (ref 0.44–1.00)
GFR, Estimated: 19 mL/min — ABNORMAL LOW (ref >60)
Glucose, Bld: 178 mg/dL — ABNORMAL HIGH (ref 70–99)
Potassium: 4.2 mmol/L (ref 3.5–5.1)
Sodium: 139 mmol/L (ref 135–145)

## 2023-02-23 LAB — GLUCOSE, CAPILLARY
Glucose-Capillary: 119 mg/dL — ABNORMAL HIGH (ref 70–99)
Glucose-Capillary: 173 mg/dL — ABNORMAL HIGH (ref 70–99)
Glucose-Capillary: 150 mg/dL — ABNORMAL HIGH (ref 70–99)

## 2023-02-23 MED ORDER — METOPROLOL TARTRATE 25 MG PO TABS: 25.0000 mg | ORAL_TABLET | Freq: Two times a day (BID) | ORAL | 0 refills | Status: DC

## 2023-02-23 MED ORDER — INSULIN LISPRO 100 UNIT/ML IJ SOLN: 5.0000 [IU] | Freq: Three times a day (TID) | INTRAMUSCULAR | 11 refills | Status: DC

## 2023-02-23 MED ORDER — GUAIFENESIN 100 MG/5ML PO LIQD: 5.0000 mL | ORAL | 0 refills | Status: DC | PRN

## 2023-02-23 MED ORDER — DILTIAZEM HCL ER COATED BEADS 180 MG PO CP24: 180.0000 mg | ORAL_CAPSULE | Freq: Every day | ORAL | 0 refills | Status: DC

## 2023-02-23 MED ORDER — APIXABAN 2.5 MG PO TABS: 2.5000 mg | ORAL_TABLET | Freq: Two times a day (BID) | ORAL | 0 refills | Status: DC

## 2023-02-23 NOTE — Progress Notes (Signed)
Natina B Muscato  A and O x 4. VSS. Pt tolerating diet well. No complaints of pain or nausea. IV removed intact, prescriptions given. Pt voiced understanding of discharge instructions with no further questions. Pt discharged via wheelchair.    Allergies as of 02/23/2023       Reactions   Codeine Palpitations   Other Reaction: RACES HEART   Dilaudid [hydromorphone] Other (See Comments), Rash   Other Reaction: lightheaded, altered ms, nervous, Paranoia paranoid Other Reaction: lightheaded, altered ms, nervous, Paranoia   Demerol [meperidine] Other (See Comments), Rash   agitated Agitation   Pravastatin Other (See Comments)   Muscle aches   Amiodarone Other (See Comments)   Per family pt had twitching while taking and cardiologist took pt off    Keppra [levetiracetam]    Lipitor [atorvastatin] Other (See Comments), Itching   unknown   Nifedipine Other (See Comments)   unknown   Statins Rash   Makes her crazy   Sulfa Antibiotics Rash   Vancomycin Rash   deafness        Medication List     STOP taking these medications    aspirin EC 81 MG tablet   carvedilol 25 MG tablet Commonly known as: COREG   hydrALAZINE 25 MG tablet Commonly known as: APRESOLINE   losartan 25 MG tablet Commonly known as: COZAAR       TAKE these medications    acetaminophen 500 MG tablet Commonly known as: TYLENOL Take 500-1,000 mg by mouth every 6 (six) hours as needed for mild pain or fever.   albuterol 108 (90 Base) MCG/ACT inhaler Commonly known as: VENTOLIN HFA Inhale 2 puffs into the lungs every 6 (six) hours as needed for wheezing or shortness of breath.   apixaban 2.5 MG Tabs tablet Commonly known as: ELIQUIS Take 1 tablet (2.5 mg total) by mouth 2 (two) times daily. What changed:  medication strength how much to take   diltiazem 180 MG 24 hr capsule Commonly known as: CARDIZEM CD Take 1 capsule (180 mg total) by mouth daily. Start taking on: February 24, 2023 What changed:   medication strength how much to take   gabapentin 300 MG capsule Commonly known as: NEURONTIN Take 1 capsule (300 mg total) by mouth 2 (two) times daily.   guaiFENesin 100 MG/5ML liquid Commonly known as: ROBITUSSIN Take 5 mLs by mouth every 4 (four) hours as needed for cough or to loosen phlegm.   ICAPS AREDS 2 PO Take 1 capsule by mouth in the morning and at bedtime.   insulin glargine 100 UNIT/ML injection Commonly known as: LANTUS Inject 0.64 mLs (64 Units total) into the skin at bedtime.   insulin lispro 100 UNIT/ML injection Commonly known as: HumaLOG Inject 0.05 mLs (5 Units total) into the skin 3 (three) times daily with meals. What changed: how much to take   metoprolol tartrate 25 MG tablet Commonly known as: LOPRESSOR Take 1 tablet (25 mg total) by mouth 2 (two) times daily.   multivitamin with minerals Tabs tablet Take 1 tablet by mouth daily.   omeprazole 40 MG capsule Commonly known as: PRILOSEC Take 40 mg by mouth daily.   oxybutynin 10 MG 24 hr tablet Commonly known as: DITROPAN-XL Take 10 mg by mouth at bedtime.   PARoxetine 40 MG tablet Commonly known as: PAXIL Take 1 tablet (40 mg total) by mouth at bedtime.   rosuvastatin 5 MG tablet Commonly known as: CRESTOR Take 2.5 mg by mouth at bedtime.   sitaGLIPtin  50 MG tablet Commonly known as: JANUVIA Take 50 mg by mouth daily.   torsemide 20 MG tablet Commonly known as: DEMADEX Take 1 tablet (20 mg total) by mouth daily. Please start from 11/01/2021   traZODone 50 MG tablet Commonly known as: DESYREL Take 1.5 tablets (75 mg total) by mouth at bedtime.        Vitals:   02/23/23 0822 02/23/23 1258  BP: (!) 159/132 (!) 157/70  Pulse: 64 65  Resp: 16 16  Temp: 97.6 F (36.4 C) 97.9 F (36.6 C)  SpO2: 93% 92%    Suzzanne Cloud

## 2023-02-23 NOTE — Discharge Summary (Signed)
Physician Discharge Summary   Patient: Cheyenne Frank MRN: [id number] DOB: 1941/03/12  Admit date:     02/19/2023  Discharge date: 02/23/23  Discharge Physician: Yadira Hada   PCP: Terrence Dupont, [facility]   Recommendations at discharge:  {Tip this will not be part of the note when signed- Example include specific recommendations for outpatient follow-up, pending tests to follow-up on. (Optional):2678 Take medications as recommended  Discharge Diagnoses: Principal Problem:   Acute on chronic diastolic (congestive) heart failure (HCC) Active Problems:   Atrial flutter with rapid ventricular response (HCC)   TIA (transient ischemic attack)   Acute kidney injury superimposed on chronic kidney disease (HCC)   Hyperkalemia   Uncontrolled type 2 diabetes mellitus with hyperglycemia, with long-term current use of insulin (HCC)   Essential hypertension   Stage 3b chronic kidney disease (CKD) (HCC)   Dyslipidemia   Depression   Acute bronchitis  Resolved Problems:   * No resolved hospital problems. *  Hospital Course: Cheyenne Frank is a 82 y.o. Caucasian female with medical history significant for asthma, anxiety, type 2 diabetes mellitus, GERD, hypertension, OSA, RLS and CVA, who presented to the ER with acute onset of generalized weakness and palpitations.  She admitted to cough and wheezing without dyspnea.  She has been having lower extremity edema and dyspnea on exertion.  Her heart rate was in the 140s per EMS and systolic BP in the 80s with a blood glucose of 333.  No fever or chills.  No nausea or vomiting or abdominal pain.  No dysuria, oliguria or hematuria or flank pain.  She had a left eye procedure a couple days ago with residual left subconjunctival hemorrhage that she was told should resolve in the next couple of days.  No headache or dizziness or blurred vision.  No paresthesias or focal muscle weakness.   ED Course: Upon presentation to the emergency room, heart  rate was 134 and vital signs were otherwise within normal.  Labs revealed sodium 134 potassium 5.6 and chloride 97.  Blood glucose was 313 with BUN of 50 and creatinine 2.38 above previous levels 44/1.68 on 11/18/2022 with a calcium 8.4 and total obtained 6.3 with albumin of 3.1.  BNP was 623.  Lactic acid was 1.8 and later 2.  CBC showed hemoglobin 11.7 hematocrit 37.3.  D-dimer was 1.06.   EKG: showed suspected atrial flutter with RVR of 135 with anteroseptal Q waves. Imaging: Chest CTA revealed no evidence for PE.  It showed mild diffuse interstitial thickening that may represent mild pulmonary edema and small bilateral pleural effusions, small pericardial effusion, her prior cholecystectomy and postoperative changes within the lower thoracic and upper lumbar spine as well as aortic atherosclerosis.   The patient was initially given 1 L bolus of IV normal saline and 125 mg of IV Solu-Medrol as well as DuoNeb and IV Cardizem bolus of 15 mg followed by IV Cardizem drip.  She will be given 40 mg of IV Lasix.  She will be admitted to a progressive unit bed for further evaluation and management.     Assessment and Plan:  Acute on chronic diastolic dysfunction CHF Most likely triggered by rapid A-fib Last known LVEF of 70 to 75% with LVH and grade 2 diastolic dysfunction from a 2D echocardiogram which was done 12/23 Lasix was discontinued due to worsening renal function Optimize blood pressure control Continue metoprolol Maintain low-salt diet Check daily weight       A-fib with rapid ventricular rate (paroxysmal)  Currently off Cardizem drip Back in sinus rhythm Rate controlled on oral Cardizem and metoprolol Continue Eliquis as secondary prophylaxis for an acute stroke but at a decreased dose of 2.5mg  bid       TIA Patient with episodes of slurred speech which has resolved Code stroke was called and patient had a stat CT scan of the head without contrast which was negative for bleed MRI  of the brain showed remote infarct with no acute intracranial abnormality Appreciate neurology input Optimize blood pressure control       Hyperkalemia Most likely secondary to AKI and ARB use Losartan has been discontinued due to renal insufficiency      Diabetes mellitus type 2 with hyperglycemia A1c of 10.2 consistent with poor control Continue long-acting insulin and NovoLog 5 units with meals     Acute kidney injury superimposed on stage IIIb chronic kidney disease Renal function appears stable Monitor closely while on diuretic therapy Discontinue Cozaar       Depression Continue Paxil and trazodone       Morbid obesity BMI 45.31 Complicates overall prognosis and care Lifestyle modification and exercise has been discussed with patient in detail       Physical deconditioning Appreciate PT input.  Patient will be discharged home with home health PT               Consultants: Cardiology Procedures performed: 2D echocardiogram: Disposition: Home health Diet recommendation:  Discharge Diet Orders (From admission, onward)     Start     Ordered   02/23/23 0000  Diet - low sodium heart healthy        02/23/23 1432   02/23/23 0000  Diet Carb Modified        02/23/23 1432           Cardiac diet DISCHARGE MEDICATION: Allergies as of 02/23/2023       Reactions   Codeine Palpitations   Other Reaction: RACES HEART   Dilaudid [hydromorphone] Other (See Comments), Rash   Other Reaction: lightheaded, altered ms, nervous, Paranoia paranoid Other Reaction: lightheaded, altered ms, nervous, Paranoia   Demerol [meperidine] Other (See Comments), Rash   agitated Agitation   Pravastatin Other (See Comments)   Muscle aches   Amiodarone Other (See Comments)   Per family pt had twitching while taking and cardiologist took pt off    Keppra [levetiracetam]    Lipitor [atorvastatin] Other (See Comments), Itching   unknown   Nifedipine Other (See Comments)    unknown   Statins Rash   Makes her crazy   Sulfa Antibiotics Rash   Vancomycin Rash   deafness        Medication List     STOP taking these medications    aspirin EC 81 MG tablet   carvedilol 25 MG tablet Commonly known as: COREG   hydrALAZINE 25 MG tablet Commonly known as: APRESOLINE   losartan 25 MG tablet Commonly known as: COZAAR       TAKE these medications    acetaminophen 500 MG tablet Commonly known as: TYLENOL Take 500-1,000 mg by mouth every 6 (six) hours as needed for mild pain or fever.   albuterol 108 (90 Base) MCG/ACT inhaler Commonly known as: VENTOLIN HFA Inhale 2 puffs into the lungs every 6 (six) hours as needed for wheezing or shortness of breath.   apixaban 2.5 MG Tabs tablet Commonly known as: ELIQUIS Take 1 tablet (2.5 mg total) by mouth 2 (two) times daily. What changed:  medication strength how much to take   diltiazem 180 MG 24 hr capsule Commonly known as: CARDIZEM CD Take 1 capsule (180 mg total) by mouth daily. Start taking on: February 24, 2023 What changed:  medication strength how much to take   gabapentin 300 MG capsule Commonly known as: NEURONTIN Take 1 capsule (300 mg total) by mouth 2 (two) times daily.   guaiFENesin 100 MG/5ML liquid Commonly known as: ROBITUSSIN Take 5 mLs by mouth every 4 (four) hours as needed for cough or to loosen phlegm.   ICAPS AREDS 2 PO Take 1 capsule by mouth in the morning and at bedtime.   insulin glargine 100 UNIT/ML injection Commonly known as: LANTUS Inject 0.64 mLs (64 Units total) into the skin at bedtime.   insulin lispro 100 UNIT/ML injection Commonly known as: HumaLOG Inject 0.05 mLs (5 Units total) into the skin 3 (three) times daily with meals. What changed: how much to take   metoprolol tartrate 25 MG tablet Commonly known as: LOPRESSOR Take 1 tablet (25 mg total) by mouth 2 (two) times daily.   multivitamin with minerals Tabs tablet Take 1 tablet by mouth daily.    omeprazole 40 MG capsule Commonly known as: PRILOSEC Take 40 mg by mouth daily.   oxybutynin 10 MG 24 hr tablet Commonly known as: DITROPAN-XL Take 10 mg by mouth at bedtime.   PARoxetine 40 MG tablet Commonly known as: PAXIL Take 1 tablet (40 mg total) by mouth at bedtime.   rosuvastatin 5 MG tablet Commonly known as: CRESTOR Take 2.5 mg by mouth at bedtime.   sitaGLIPtin 50 MG tablet Commonly known as: JANUVIA Take 50 mg by mouth daily.   torsemide 20 MG tablet Commonly known as: DEMADEX Take 1 tablet (20 mg total) by mouth daily. Please start from 11/01/2021   traZODone 50 MG tablet Commonly known as: DESYREL Take 1.5 tablets (75 mg total) by mouth at bedtime.        Follow-up Information     Paraschos, Alexander, MD. Go in 1 week(s).   Specialty: Cardiology Contact information: [address] [facility] [place] [address] [phone]         [facility] Follow up.   Specialty: Home Health Services Why: They will follow up with you for your home health needs. Contact information: [address] [place] [address] [phone]                Discharge Exam: Ceasar Mons Weights   02/19/23 2049 02/20/23 1422 02/22/23 0959  Weight: 104 kg 114.2 kg 113.1 kg   General: Elderly female, well nourished, in no acute distress.  Sitting upright in bed  HEENT:  Normocephalic and atraumatic. Left eye subconjunctival hemorrhage present on arrival from recent eye injection 5/31 Neck:   No JVD.  Lungs: Normal respiratory effort on room air.  Decreased breath sounds with trace expiratory wheezes.   Heart: regular rate and rhythm. Normal S1 and S2 without gallops or murmurs.  Abdomen: Mildly distended appearing with excess adiposity, nontender to palpation.  Much improved redness and erythema with whitish discharge over lower abdominal folds bilaterally Msk: Normal strength and tone for age. Extremities: Warm and  well perfused. No clubbing, cyanosis.  Trace bilateral lower extremity edema with chronic appearing hyperpigmentation.  Neuro: Awake and alert, answer simple questions appropriately.   Psych: Pleasant and cooperative.      Condition at discharge: stable  The results of significant diagnostics from this hospitalization (  including imaging, microbiology, ancillary and laboratory) are listed below for reference.   Imaging Studies: DG Chest Port 1 View  Result Date: 02/23/2023 CLINICAL DATA:  Shortness of breath EXAM: PORTABLE CHEST 1 VIEW COMPARISON:  Previous studies including the examination of 02/19/2023 FINDINGS: Transverse diameter of heart is increased. Central pulmonary vessels are prominent without signs of LDL or pulmonary edema. There is increased density in left lower lung field obscuring left hemidiaphragm. There is blunting of left lateral CP angle. There is no pneumothorax. There is pleural density in the lateral aspects of both lungs, more so on the left side, possibly suggesting pleural thickening. There is previous surgical fusion in lower thoracic and upper lumbar spine. Degenerative changes are noted in both shoulders. IMPRESSION: There is increased density in left lower lung field which may suggest infiltrate in left lower lung field and pleural effusion. Part of this finding may be due to chest wall attenuation. Electronically Signed   By: Ernie Avena M.D.   On: 02/23/2023 13:19   MR BRAIN WO CONTRAST  Result Date: 02/20/2023 CLINICAL DATA:  Neuro deficit, acute, stroke suspected EXAM: MRI HEAD WITHOUT CONTRAST TECHNIQUE: Multiplanar, multiecho pulse sequences of the brain and surrounding structures were obtained without intravenous contrast. COMPARISON:  CT head June 5, 24. FINDINGS: Brain: No acute infarction, acute hemorrhage, hydrocephalus, extra-axial collection or mass lesion. Remote infarcts in the left internal capsule, occipital lobe and bilateral cerebellum.  Vascular: Major arterial flow voids are maintained at the skull base. Skull and upper cervical spine: Normal marrow signal. Sinuses/Orbits: Clear sinuses.  No acute orbital findings. Other: No mastoid effusions. IMPRESSION: 1. No evidence of acute intracranial abnormality. 2. Remote infarcts. Electronically Signed   By: Feliberto Harts M.D.   On: 02/20/2023 11:58   CT HEAD CODE STROKE WO CONTRAST`  Result Date: 02/20/2023 CLINICAL DATA:  Code stroke.  Worsening speech EXAM: CT HEAD WITHOUT CONTRAST TECHNIQUE: Contiguous axial images were obtained from the base of the skull through the vertex without intravenous contrast. RADIATION DOSE REDUCTION: This exam was performed according to the departmental dose-optimization program which includes automated exposure control, adjustment of the mA and/or kV according to patient size and/or use of iterative reconstruction technique. COMPARISON:  11/16/2022 FINDINGS: Brain: Chronic bilateral cerebellar, left occipital, and left low internal capsule infarcts. Lacunar infarct in the right centrum semiovale. Cerebral volume loss which is generalized. No evidence of acute infarct. No hemorrhage, hydrocephalus, or masslike finding Vascular: No hyperdense vessel when allowing for streak artifact in the posterior fossa. Skull: High left parietal burr hole Sinuses/Orbits: Negative ASPECTS (Alberta Stroke Program Early CT Score) - Ganglionic level infarction (caudate, lentiform nuclei, internal capsule, insula, M1-M3 cortex): 7 - Supraganglionic infarction (M4-M6 cortex): 3 Total score (0-10 with 10 being normal): 10 IMPRESSION: Chronic infarcts primarily in the posterior circulation. No acute or focal finding. Electronically Signed   By: Tiburcio Pea M.D.   On: 02/20/2023 07:02   CT Angio Chest PE W and/or Wo Contrast  Result Date: 02/19/2023 CLINICAL DATA:  Weakness and hypotension. EXAM: CT ANGIOGRAPHY CHEST WITH CONTRAST TECHNIQUE: Multidetector CT imaging of the chest  was performed using the standard protocol during bolus administration of intravenous contrast. Multiplanar CT image reconstructions and MIPs were obtained to evaluate the vascular anatomy. RADIATION DOSE REDUCTION: This exam was performed according to the departmental dose-optimization program which includes automated exposure control, adjustment of the mA and/or kV according to patient size and/or use of iterative reconstruction technique. CONTRAST:  75mL OMNIPAQUE IOHEXOL 350  MG/ML SOLN COMPARISON:  May 16, 2021 FINDINGS: Cardiovascular: There is moderate severity calcification of the aortic arch, without evidence of aortic aneurysm. Satisfactory opacification of the pulmonary arteries to the segmental level. No evidence of pulmonary embolism. Normal heart size. A small pericardial effusion is seen. Mediastinum/Nodes: No enlarged mediastinal, hilar, or axillary lymph nodes. Thyroid gland, trachea, and esophagus demonstrate no significant findings. Lungs/Pleura: Mild diffuse interstitial thickening is noted. Mild atelectasis is seen along the posterior aspects of the right upper lobe and bilateral lower lobes. There are small bilateral pleural effusions. No pneumothorax is identified. Upper Abdomen: Multiple surgical clips are seen within the gallbladder fossa. Musculoskeletal: Multiple tortuous venous varices are seen within the subcutaneous fat of the anterior left chest wall. Multilevel degenerative changes seen throughout the thoracic spine with postoperative changes also seen within the lower thoracic and upper lumbar spine. Review of the MIP images confirms the above findings. IMPRESSION: 1. No evidence of pulmonary embolism. 2. Mild diffuse interstitial thickening which may represent mild pulmonary edema. 3. Small bilateral pleural effusions. 4. Small pericardial effusion. 5. Evidence of prior cholecystectomy. 6. Postoperative changes within the lower thoracic and upper lumbar spine. 7. Aortic  atherosclerosis. Aortic Atherosclerosis (ICD10-I70.0). Electronically Signed   By: Aram Candela M.D.   On: 02/19/2023 22:56   DG Chest Port 1 View  Result Date: 02/19/2023 CLINICAL DATA:  [id number] Wheezing on expiration [id number] EXAM: PORTABLE CHEST - 1 VIEW COMPARISON:  11/15/2022 FINDINGS: Lungs are clear. Heart size upper limits normal. There is blunting of the left lateral costophrenic angle suggesting small effusion. Thoracolumbar fixation hardware partially visualized. Bilateral shoulder DJD. IMPRESSION: Possible small left effusion. Electronically Signed   By: Corlis Leak M.D.   On: 02/19/2023 21:41    Microbiology: Results for orders placed or performed during the hospital encounter of 02/19/23  Blood culture (routine x 2)     Status: None (Preliminary result)   Collection Time: 02/19/23  9:10 PM   Specimen: BLOOD  Result Value Ref Range Status   Specimen Description BLOOD BLOOD RIGHT ARM  Final   Special Requests   Final    BOTTLES DRAWN AEROBIC AND ANAEROBIC Blood Culture adequate volume   Culture   Final    NO GROWTH 4 DAYS Performed at [facility], [address] [place], [place] [address]    Report Status PENDING  Incomplete  Blood culture (routine x 2)     Status: None (Preliminary result)   Collection Time: 02/19/23 11:05 PM   Specimen: BLOOD  Result Value Ref Range Status   Specimen Description BLOOD  RIGHT HAND  Final   Special Requests   Final    BOTTLES DRAWN AEROBIC AND ANAEROBIC Blood Culture adequate volume   Culture   Final    NO GROWTH 4 DAYS Performed at [facility], [address] [place], [place] [address]    Report Status PENDING  Incomplete    Labs: CBC: Recent Labs  Lab 02/19/23 2106 02/20/23 0619 02/21/23 0929 02/22/23 0433 02/23/23 0438  WBC 7.6 7.3 10.0 9.6 7.1  NEUTROABS 5.1  --   --   --   --   HGB 11.7* 11.5* 10.8* 11.1* 10.5*  HCT 37.3 36.7 33.9* 35.7* 33.5*  MCV 92.6 92.2 91.9 93.2 92.5  PLT 157 141* 157  180 128*   Basic Metabolic Panel: Recent Labs  Lab 02/19/23 2106 02/20/23 0619 02/20/23 1536 02/21/23 0511 02/21/23 0653 02/21/23 0929 02/22/23 0433 02/23/23 0438  NA 134* 137  --   --   --  139 141 139  K 5.6* 5.8*   < > 4.8 4.8 5.1 4.4 4.2  CL 97* 99  --   --   --  100 102 100  CO2 30 29  --   --   --  28 28 29   GLUCOSE 313* 346*  --   --   --  267* 98 178*  BUN 50* 55*  --   --   --  74* 78* 79*  CREATININE 2.38* 2.26*  --   --   --  2.29* 2.43* 2.47*  CALCIUM 8.4* 8.5*  --   --   --  8.6* 8.6* 8.5*  MG  --   --   --   --   --  2.3  --   --    < > = values in this interval not displayed.   Liver Function Tests: Recent Labs  Lab 02/19/23 2106  AST 31  ALT 15  ALKPHOS 92  BILITOT 1.0  PROT 6.3*  ALBUMIN 3.1*   CBG: Recent Labs  Lab 02/22/23 1619 02/22/23 2057 02/23/23 0825 02/23/23 1219 02/23/23 1259  GLUCAP 155* 196* 119* 173* 150*    Discharge time spent: greater than 30 minutes.  Signed: Lucile Shutters, MD [facility] 02/23/2023

## 2023-02-24 LAB — CULTURE, BLOOD (ROUTINE X 2)
Culture: NO GROWTH
Culture: NO GROWTH
Special Requests: ADEQUATE
Special Requests: ADEQUATE

## 2023-04-01 ENCOUNTER — Emergency Department: Payer: Medicare Other

## 2023-04-01 ENCOUNTER — Other Ambulatory Visit: Payer: Self-pay

## 2023-04-01 ENCOUNTER — Inpatient Hospital Stay
Admission: EM | Admit: 2023-04-01 | Discharge: 2023-04-08 | DRG: 190 | Disposition: A | Discharge status: Home Health | Payer: Medicare Other | Attending: Student | Admitting: Student

## 2023-04-01 DIAGNOSIS — I5033 Acute on chronic diastolic (congestive) heart failure: Secondary | ICD-10-CM | POA: Diagnosis present

## 2023-04-01 DIAGNOSIS — Z7984 Long term (current) use of oral hypoglycemic drugs: Secondary | ICD-10-CM

## 2023-04-01 DIAGNOSIS — G2581 Restless legs syndrome: Secondary | ICD-10-CM | POA: Diagnosis present

## 2023-04-01 DIAGNOSIS — E875 Hyperkalemia: Secondary | ICD-10-CM | POA: Diagnosis present

## 2023-04-01 DIAGNOSIS — E1129 Type 2 diabetes mellitus with other diabetic kidney complication: Secondary | ICD-10-CM | POA: Diagnosis present

## 2023-04-01 DIAGNOSIS — M797 Fibromyalgia: Secondary | ICD-10-CM | POA: Diagnosis present

## 2023-04-01 DIAGNOSIS — T380X5A Adverse effect of glucocorticoids and synthetic analogues, initial encounter: Secondary | ICD-10-CM | POA: Diagnosis present

## 2023-04-01 DIAGNOSIS — Z825 Family history of asthma and other chronic lower respiratory diseases: Secondary | ICD-10-CM

## 2023-04-01 DIAGNOSIS — E876 Hypokalemia: Secondary | ICD-10-CM | POA: Diagnosis present

## 2023-04-01 DIAGNOSIS — Z981 Arthrodesis status: Secondary | ICD-10-CM

## 2023-04-01 DIAGNOSIS — I4892 Unspecified atrial flutter: Secondary | ICD-10-CM | POA: Diagnosis present

## 2023-04-01 DIAGNOSIS — N1832 Chronic kidney disease, stage 3b: Secondary | ICD-10-CM | POA: Diagnosis present

## 2023-04-01 DIAGNOSIS — Z882 Allergy status to sulfonamides status: Secondary | ICD-10-CM

## 2023-04-01 DIAGNOSIS — Z794 Long term (current) use of insulin: Secondary | ICD-10-CM

## 2023-04-01 DIAGNOSIS — J441 Chronic obstructive pulmonary disease with (acute) exacerbation: Secondary | ICD-10-CM | POA: Diagnosis present

## 2023-04-01 DIAGNOSIS — F0390 Unspecified dementia without behavioral disturbance: Secondary | ICD-10-CM | POA: Diagnosis not present

## 2023-04-01 DIAGNOSIS — I10 Essential (primary) hypertension: Secondary | ICD-10-CM | POA: Diagnosis present

## 2023-04-01 DIAGNOSIS — R5381 Other malaise: Secondary | ICD-10-CM | POA: Diagnosis not present

## 2023-04-01 DIAGNOSIS — I130 Hypertensive heart and chronic kidney disease with heart failure and stage 1 through stage 4 chronic kidney disease, or unspecified chronic kidney disease: Secondary | ICD-10-CM | POA: Diagnosis present

## 2023-04-01 DIAGNOSIS — Z885 Allergy status to narcotic agent status: Secondary | ICD-10-CM

## 2023-04-01 DIAGNOSIS — E1122 Type 2 diabetes mellitus with diabetic chronic kidney disease: Secondary | ICD-10-CM | POA: Diagnosis present

## 2023-04-01 DIAGNOSIS — I480 Paroxysmal atrial fibrillation: Secondary | ICD-10-CM | POA: Diagnosis present

## 2023-04-01 DIAGNOSIS — E662 Morbid (severe) obesity with alveolar hypoventilation: Secondary | ICD-10-CM | POA: Diagnosis present

## 2023-04-01 DIAGNOSIS — Z1152 Encounter for screening for COVID-19: Secondary | ICD-10-CM | POA: Diagnosis not present

## 2023-04-01 DIAGNOSIS — N184 Chronic kidney disease, stage 4 (severe): Secondary | ICD-10-CM | POA: Diagnosis not present

## 2023-04-01 DIAGNOSIS — Z9049 Acquired absence of other specified parts of digestive tract: Secondary | ICD-10-CM

## 2023-04-01 DIAGNOSIS — Z6841 Body Mass Index (BMI) 40.0 and over, adult: Secondary | ICD-10-CM

## 2023-04-01 DIAGNOSIS — I5032 Chronic diastolic (congestive) heart failure: Secondary | ICD-10-CM

## 2023-04-01 DIAGNOSIS — Z888 Allergy status to other drugs, medicaments and biological substances status: Secondary | ICD-10-CM

## 2023-04-01 DIAGNOSIS — J9601 Acute respiratory failure with hypoxia: Secondary | ICD-10-CM | POA: Diagnosis present

## 2023-04-01 DIAGNOSIS — K219 Gastro-esophageal reflux disease without esophagitis: Secondary | ICD-10-CM | POA: Diagnosis present

## 2023-04-01 DIAGNOSIS — E785 Hyperlipidemia, unspecified: Secondary | ICD-10-CM | POA: Diagnosis present

## 2023-04-01 DIAGNOSIS — E1165 Type 2 diabetes mellitus with hyperglycemia: Secondary | ICD-10-CM | POA: Diagnosis present

## 2023-04-01 DIAGNOSIS — Z8673 Personal history of transient ischemic attack (TIA), and cerebral infarction without residual deficits: Secondary | ICD-10-CM | POA: Diagnosis not present

## 2023-04-01 DIAGNOSIS — Z66 Do not resuscitate: Secondary | ICD-10-CM | POA: Diagnosis present

## 2023-04-01 DIAGNOSIS — Z7901 Long term (current) use of anticoagulants: Secondary | ICD-10-CM | POA: Diagnosis not present

## 2023-04-01 DIAGNOSIS — I4891 Unspecified atrial fibrillation: Secondary | ICD-10-CM | POA: Diagnosis present

## 2023-04-01 DIAGNOSIS — D509 Iron deficiency anemia, unspecified: Secondary | ICD-10-CM | POA: Diagnosis present

## 2023-04-01 DIAGNOSIS — I639 Cerebral infarction, unspecified: Secondary | ICD-10-CM | POA: Diagnosis present

## 2023-04-01 DIAGNOSIS — Z90710 Acquired absence of both cervix and uterus: Secondary | ICD-10-CM

## 2023-04-01 DIAGNOSIS — R531 Weakness: Secondary | ICD-10-CM | POA: Diagnosis present

## 2023-04-01 DIAGNOSIS — Z96652 Presence of left artificial knee joint: Secondary | ICD-10-CM | POA: Diagnosis present

## 2023-04-01 DIAGNOSIS — Z79899 Other long term (current) drug therapy: Secondary | ICD-10-CM | POA: Diagnosis not present

## 2023-04-01 DIAGNOSIS — I5030 Unspecified diastolic (congestive) heart failure: Secondary | ICD-10-CM | POA: Diagnosis not present

## 2023-04-01 DIAGNOSIS — J9811 Atelectasis: Secondary | ICD-10-CM | POA: Diagnosis present

## 2023-04-01 DIAGNOSIS — J449 Chronic obstructive pulmonary disease, unspecified: Secondary | ICD-10-CM | POA: Diagnosis not present

## 2023-04-01 LAB — BLOOD GAS, VENOUS
Acid-Base Excess: 5.6 mmol/L — ABNORMAL HIGH (ref 0.0–2.0)
Bicarbonate: 33.5 mmol/L — ABNORMAL HIGH (ref 20.0–28.0)
O2 Content: 2 L/min
O2 Saturation: 88 %
Patient temperature: 37
pCO2, Ven: 65 mmHg — ABNORMAL HIGH (ref 44–60)
pH, Ven: 7.32 (ref 7.25–7.43)
pO2, Ven: 57 mmHg — ABNORMAL HIGH (ref 32–45)

## 2023-04-01 LAB — CBC WITH DIFFERENTIAL/PLATELET
Abs Immature Granulocytes: 0.04 10*3/uL (ref 0.00–0.07)
Basophils Absolute: 0 10*3/uL (ref 0.0–0.1)
Basophils Relative: 0 %
Eosinophils Absolute: 0.2 10*3/uL (ref 0.0–0.5)
Eosinophils Relative: 2 %
HCT: 36.6 % (ref 36.0–46.0)
Hemoglobin: 11.3 g/dL — ABNORMAL LOW (ref 12.0–15.0)
Immature Granulocytes: 0 %
Lymphocytes Relative: 15 %
Lymphs Abs: 1.4 10*3/uL (ref 0.7–4.0)
MCH: 28.8 pg (ref 26.0–34.0)
MCHC: 30.9 g/dL (ref 30.0–36.0)
MCV: 93.4 fL (ref 80.0–100.0)
Monocytes Absolute: 1.1 10*3/uL — ABNORMAL HIGH (ref 0.1–1.0)
Monocytes Relative: 11 %
Neutro Abs: 7 10*3/uL (ref 1.7–7.7)
Neutrophils Relative %: 72 %
Platelets: 163 10*3/uL (ref 150–400)
RBC: 3.92 MIL/uL (ref 3.87–5.11)
RDW: 15.3 % (ref 11.5–15.5)
WBC: 9.8 10*3/uL (ref 4.0–10.5)
nRBC: 0 % (ref 0.0–0.2)

## 2023-04-01 LAB — BASIC METABOLIC PANEL
Anion gap: 4 — ABNORMAL LOW (ref 5–15)
BUN: 56 mg/dL — ABNORMAL HIGH (ref 8–23)
CO2: 31 mmol/L (ref 22–32)
Calcium: 8.7 mg/dL — ABNORMAL LOW (ref 8.9–10.3)
Chloride: 105 mmol/L (ref 98–111)
Creatinine, Ser: 2.12 mg/dL — ABNORMAL HIGH (ref 0.44–1.00)
GFR, Estimated: 23 mL/min — ABNORMAL LOW (ref >60)
Glucose, Bld: 135 mg/dL — ABNORMAL HIGH (ref 70–99)
Potassium: 4.7 mmol/L (ref 3.5–5.1)
Sodium: 140 mmol/L (ref 135–145)

## 2023-04-01 LAB — GLUCOSE, CAPILLARY
Glucose-Capillary: 258 mg/dL — ABNORMAL HIGH (ref 70–99)
Glucose-Capillary: 249 mg/dL — ABNORMAL HIGH (ref 70–99)
Glucose-Capillary: 279 mg/dL — ABNORMAL HIGH (ref 70–99)

## 2023-04-01 LAB — SARS CORONAVIRUS 2 BY RT PCR: SARS Coronavirus 2 by RT PCR: NEGATIVE

## 2023-04-01 LAB — CBG MONITORING, ED: Glucose-Capillary: 160 mg/dL — ABNORMAL HIGH (ref 70–99)

## 2023-04-01 LAB — BRAIN NATRIURETIC PEPTIDE: B Natriuretic Peptide: 524.8 pg/mL — ABNORMAL HIGH (ref 0.0–100.0)

## 2023-04-01 MED ORDER — SODIUM CHLORIDE 0.9 % IV SOLN
500.0000 mg | INTRAVENOUS | Status: DC
  Administered 2023-04-01: 500 mg via INTRAVENOUS
  Filled 2023-04-01: qty 5

## 2023-04-01 MED ORDER — OXYBUTYNIN CHLORIDE ER 10 MG PO TB24
10.0000 mg | ORAL_TABLET | Freq: Every day | ORAL | Status: DC
  Administered 2023-04-01 – 2023-04-07 (×7): 10 mg via ORAL
  Filled 2023-04-01 (×7): qty 1

## 2023-04-01 MED ORDER — METHYLPREDNISOLONE SODIUM SUCC 125 MG IJ SOLR
60.0000 mg | Freq: Two times a day (BID) | INTRAMUSCULAR | Status: DC
  Administered 2023-04-01 – 2023-04-02 (×2): 60 mg via INTRAVENOUS
  Filled 2023-04-01 (×2): qty 2

## 2023-04-01 MED ORDER — APIXABAN 2.5 MG PO TABS
2.5000 mg | ORAL_TABLET | Freq: Two times a day (BID) | ORAL | Status: DC
  Administered 2023-04-01 – 2023-04-08 (×15): 2.5 mg via ORAL
  Filled 2023-04-01 (×15): qty 1

## 2023-04-01 MED ORDER — PAROXETINE HCL 20 MG PO TABS
40.0000 mg | ORAL_TABLET | Freq: Every day | ORAL | Status: DC
  Administered 2023-04-01 – 2023-04-07 (×7): 40 mg via ORAL
  Filled 2023-04-01 (×7): qty 2

## 2023-04-01 MED ORDER — ADULT MULTIVITAMIN W/MINERALS CH
1.0000 | ORAL_TABLET | Freq: Every day | ORAL | Status: DC
  Administered 2023-04-01 – 2023-04-08 (×8): 1 via ORAL
  Filled 2023-04-01 (×8): qty 1

## 2023-04-01 MED ORDER — TRAZODONE HCL 50 MG PO TABS
75.0000 mg | ORAL_TABLET | Freq: Every day | ORAL | Status: DC
  Administered 2023-04-01 – 2023-04-07 (×7): 75 mg via ORAL
  Filled 2023-04-01 (×7): qty 2

## 2023-04-01 MED ORDER — IPRATROPIUM-ALBUTEROL 0.5-2.5 (3) MG/3ML IN SOLN
3.0000 mL | Freq: Four times a day (QID) | RESPIRATORY_TRACT | Status: DC
  Administered 2023-04-01 – 2023-04-04 (×8): 3 mL via RESPIRATORY_TRACT
  Filled 2023-04-01 (×3): qty 3
  Filled 2023-04-01: qty 6
  Filled 2023-04-01 (×5): qty 3

## 2023-04-01 MED ORDER — METOPROLOL TARTRATE 5 MG/5ML IV SOLN
5.0000 mg | INTRAVENOUS | Status: DC | PRN
  Administered 2023-04-01 – 2023-04-07 (×7): 5 mg via INTRAVENOUS
  Filled 2023-04-01 (×7): qty 5

## 2023-04-01 MED ORDER — ALBUTEROL SULFATE (2.5 MG/3ML) 0.083% IN NEBU
2.5000 mg | INHALATION_SOLUTION | RESPIRATORY_TRACT | Status: DC | PRN
  Administered 2023-04-04: 2.5 mg via RESPIRATORY_TRACT
  Filled 2023-04-01 (×2): qty 3

## 2023-04-01 MED ORDER — GABAPENTIN 300 MG PO CAPS
300.0000 mg | ORAL_CAPSULE | Freq: Two times a day (BID) | ORAL | Status: DC
  Administered 2023-04-01 – 2023-04-08 (×15): 300 mg via ORAL
  Filled 2023-04-01 (×15): qty 1

## 2023-04-01 MED ORDER — DILTIAZEM HCL ER COATED BEADS 180 MG PO CP24
180.0000 mg | ORAL_CAPSULE | Freq: Every day | ORAL | Status: DC
  Administered 2023-04-01 – 2023-04-03 (×3): 180 mg via ORAL
  Filled 2023-04-01 (×3): qty 1

## 2023-04-01 MED ORDER — MAGNESIUM SULFATE 2 GM/50ML IV SOLN
2.0000 g | INTRAVENOUS | Status: AC
  Administered 2023-04-01: 2 g via INTRAVENOUS
  Filled 2023-04-01: qty 50

## 2023-04-01 MED ORDER — ACETAMINOPHEN 325 MG PO TABS
650.0000 mg | ORAL_TABLET | Freq: Four times a day (QID) | ORAL | Status: DC | PRN
  Administered 2023-04-06: 650 mg via ORAL
  Filled 2023-04-01: qty 2

## 2023-04-01 MED ORDER — INSULIN ASPART 100 UNIT/ML IJ SOLN
0.0000 [IU] | Freq: Every day | INTRAMUSCULAR | Status: DC
  Administered 2023-04-01: 3 [IU] via SUBCUTANEOUS
  Administered 2023-04-02 – 2023-04-03 (×2): 4 [IU] via SUBCUTANEOUS
  Administered 2023-04-05 – 2023-04-06 (×2): 3 [IU] via SUBCUTANEOUS
  Administered 2023-04-07: 2 [IU] via SUBCUTANEOUS
  Filled 2023-04-01 (×6): qty 1

## 2023-04-01 MED ORDER — ALBUTEROL SULFATE (2.5 MG/3ML) 0.083% IN NEBU
5.0000 mg | INHALATION_SOLUTION | Freq: Once | RESPIRATORY_TRACT | Status: AC
  Administered 2023-04-01: 5 mg via RESPIRATORY_TRACT
  Filled 2023-04-01: qty 6

## 2023-04-01 MED ORDER — SODIUM CHLORIDE 0.9 % IV SOLN
2.0000 g | INTRAVENOUS | Status: DC
  Administered 2023-04-01: 2 g via INTRAVENOUS
  Filled 2023-04-01: qty 20

## 2023-04-01 MED ORDER — FUROSEMIDE 10 MG/ML IJ SOLN
40.0000 mg | Freq: Two times a day (BID) | INTRAMUSCULAR | Status: DC
  Administered 2023-04-01 – 2023-04-03 (×5): 40 mg via INTRAVENOUS
  Filled 2023-04-01 (×5): qty 4

## 2023-04-01 MED ORDER — ROSUVASTATIN CALCIUM 5 MG PO TABS
2.5000 mg | ORAL_TABLET | Freq: Every day | ORAL | Status: DC
  Administered 2023-04-01 – 2023-04-07 (×7): 2.5 mg via ORAL
  Filled 2023-04-01 (×2): qty 1
  Filled 2023-04-01 (×3): qty 0.5
  Filled 2023-04-01 (×2): qty 1

## 2023-04-01 MED ORDER — HYDRALAZINE HCL 20 MG/ML IJ SOLN: 5.0000 mg | INTRAMUSCULAR | Status: DC | PRN

## 2023-04-01 MED ORDER — INSULIN GLARGINE-YFGN 100 UNIT/ML ~~LOC~~ SOLN
45.0000 [IU] | Freq: Every day | SUBCUTANEOUS | Status: DC
  Administered 2023-04-01: 45 [IU] via SUBCUTANEOUS
  Filled 2023-04-01 (×2): qty 0.45

## 2023-04-01 MED ORDER — IPRATROPIUM-ALBUTEROL 0.5-2.5 (3) MG/3ML IN SOLN
3.0000 mL | RESPIRATORY_TRACT | Status: DC
  Administered 2023-04-01 (×2): 3 mL via RESPIRATORY_TRACT
  Filled 2023-04-01 (×2): qty 3

## 2023-04-01 MED ORDER — INSULIN ASPART 100 UNIT/ML IJ SOLN
0.0000 [IU] | Freq: Three times a day (TID) | INTRAMUSCULAR | Status: DC
  Administered 2023-04-01: 2 [IU] via SUBCUTANEOUS
  Administered 2023-04-01: 3 [IU] via SUBCUTANEOUS
  Administered 2023-04-01: 5 [IU] via SUBCUTANEOUS
  Administered 2023-04-02: 9 [IU] via SUBCUTANEOUS
  Administered 2023-04-02: 2 [IU] via SUBCUTANEOUS
  Administered 2023-04-02 – 2023-04-03 (×2): 7 [IU] via SUBCUTANEOUS
  Administered 2023-04-03: 9 [IU] via SUBCUTANEOUS
  Administered 2023-04-03: 7 [IU] via SUBCUTANEOUS
  Administered 2023-04-04 (×2): 2 [IU] via SUBCUTANEOUS
  Administered 2023-04-05: 1 [IU] via SUBCUTANEOUS
  Administered 2023-04-05 – 2023-04-06 (×3): 5 [IU] via SUBCUTANEOUS
  Administered 2023-04-06: 2 [IU] via SUBCUTANEOUS
  Administered 2023-04-06: 7 [IU] via SUBCUTANEOUS
  Administered 2023-04-07 (×3): 2 [IU] via SUBCUTANEOUS
  Administered 2023-04-08: 3 [IU] via SUBCUTANEOUS
  Filled 2023-04-01 (×21): qty 1

## 2023-04-01 MED ORDER — ONDANSETRON HCL 4 MG/2ML IJ SOLN: 4.0000 mg | Freq: Three times a day (TID) | INTRAMUSCULAR | Status: DC | PRN

## 2023-04-01 MED ORDER — AZITHROMYCIN 250 MG PO TABS
250.0000 mg | ORAL_TABLET | Freq: Every day | ORAL | Status: AC
  Administered 2023-04-02 – 2023-04-05 (×4): 250 mg via ORAL
  Filled 2023-04-01 (×4): qty 1

## 2023-04-01 MED ORDER — DM-GUAIFENESIN ER 30-600 MG PO TB12: 1.0000 | ORAL_TABLET | Freq: Two times a day (BID) | ORAL | Status: DC | PRN

## 2023-04-01 MED ORDER — PANTOPRAZOLE SODIUM 40 MG PO TBEC
40.0000 mg | DELAYED_RELEASE_TABLET | Freq: Every day | ORAL | Status: DC
  Administered 2023-04-01 – 2023-04-08 (×8): 40 mg via ORAL
  Filled 2023-04-01 (×8): qty 1

## 2023-04-01 NOTE — ED Provider Notes (Signed)
[facility] Provider Note    Event Date/Time   First MD Initiated Contact with Patient 04/01/23 [phone]     (approximate)   History   Chief Complaint: Shortness of Breath (Pt. In via [facility] from home for SOB x 1 day, worse tonight, HX of COPD, EMS gave 2 duoneb and 125 solumedrol PTA)   HPI  Cheyenne Frank is a 82 y.o. female with a history of hypertension diabetes paroxysmal atrial fibrillation CKD COPD who comes the ED complaining of shortness of breath for the past 2 days, gradually worsening.  No fever or cough.  No chest pain.  No dizziness syncope or palpitations.  EMS report initial oxygen saturation of 87% on room air.  They gave 2 DuoNeb's and IV Solu-Medrol.  Patient reports still being very short of breath.     Physical Exam   Triage Vital Signs: ED Triage Vitals  Encounter Vitals Group     BP      Systolic BP Percentile      Diastolic BP Percentile      Pulse      Resp      Temp      Temp src      SpO2      Weight      Height      Head Circumference      Peak Flow      Pain Score      Pain Loc      Pain Education      Exclude from Growth Chart     Most recent vital signs: Vitals:   04/01/23 0339 04/01/23 0612  BP: (!) 137/50   Pulse: (!) 127 78  Resp: (!) 22   Temp: 98.2 F (36.8 C)   SpO2: 100%     General: Awake, no distress.  Mild respiratory distress CV:  Good peripheral perfusion.  Irregular rhythm, heart rate 120.  Symmetric distal pulses Resp:  Normal effort.  Diminished air entry diffusely.  Prolonged expiratory phase and expiratory wheezing.  There are supraclavicular retractions. Abd:  No distention.  Soft nontender Other:  No lower extremity edema.  No calf tenderness.   ED Results / Procedures / Treatments   Labs (all labs ordered are listed, but only abnormal results are displayed) Labs Reviewed  CBC WITH DIFFERENTIAL/PLATELET - Abnormal; Notable for the following components:      Result Value    Hemoglobin 11.3 (*)    Monocytes Absolute 1.1 (*)    All other components within normal limits  BASIC METABOLIC PANEL - Abnormal; Notable for the following components:   Glucose, Bld 135 (*)    BUN 56 (*)    Creatinine, Ser 2.12 (*)    Calcium 8.7 (*)    GFR, Estimated 23 (*)    Anion gap 4 (*)    All other components within normal limits  SARS CORONAVIRUS 2 BY RT PCR  BLOOD GAS, VENOUS     EKG    RADIOLOGY Chest x-ray interpreted by me, appears unremarkable.  Radiology report reviewed   PROCEDURES:  Procedures   MEDICATIONS ORDERED IN ED: Medications  cefTRIAXone (ROCEPHIN) 2 g in sodium chloride 0.9 % 100 mL IVPB (2 g Intravenous New Bag/Given 04/01/23 0612)  azithromycin (ZITHROMAX) 500 mg in sodium chloride 0.9 % 250 mL IVPB (500 mg Intravenous New Bag/Given 04/01/23 0613)  albuterol (PROVENTIL) (2.5 MG/3ML) 0.083% nebulizer solution 5 mg (5 mg Nebulization Given 04/01/23 0353)  magnesium sulfate IVPB  2 g 50 mL (0 g Intravenous Stopped 04/01/23 0556)     IMPRESSION / MDM / ASSESSMENT AND PLAN / ED COURSE  I reviewed the triage vital signs and the nursing notes.  DDx: COPD exacerbation, pneumonia, pleural effusion, pneumothorax, pulmonary edema, COVID, AKI, electrolyte abnormality  Patient's presentation is most consistent with acute presentation with potential threat to life or bodily function.  Patient presents with hypoxia and respiratory symptoms consistent with COPD exacerbation.  Chest x-ray is overall unremarkable.  Doubt ACS PE dissection or pericardial effusion.  Will give additional bronchodilators and IV magnesium   Clinical Course as of 04/01/23 0635  Mon Apr 01, 2023  1610 Still hypoxic to 88% on room air, still wheezy.  Work of breathing is improved.  Will need to admit for further management due to hypoxic respiratory failure. [PS]    Clinical Course User Index [PS] Sharman Cheek, MD    ----------------------------------------- 6:38 AM on  04/01/2023 ----------------------------------------- Case discussed with hospitalist   FINAL CLINICAL IMPRESSION(S) / ED DIAGNOSES   Final diagnoses:  COPD exacerbation (HCC)  Acute respiratory failure with hypoxia (HCC)  Type 2 diabetes mellitus without complication, with long-term current use of insulin (HCC)     Rx / DC Orders   ED Discharge Orders     None        Note:  This document was prepared using Dragon voice recognition software and may include unintentional dictation errors.   Sharman Cheek, MD 04/01/23 [phone]

## 2023-04-01 NOTE — Progress Notes (Signed)
   04/01/23 1451  Assess: MEWS Score  BP 117/84  MAP (mmHg) 95  Pulse Rate (!) 128  SpO2 98 %  O2 Device Nasal Cannula  O2 Flow Rate (L/min) 3 L/min  Assess: MEWS Score  MEWS Temp 0  MEWS Systolic 0  MEWS Pulse 2  MEWS RR 0  MEWS LOC 0  MEWS Score 2  MEWS Score Color Yellow  Assess: if the MEWS score is Yellow or Red  Were vital signs taken at a resting state? Yes  Focused Assessment Change from prior assessment (see assessment flowsheet)  Does the patient meet 2 or more of the SIRS criteria? No  MEWS guidelines implemented  Yes, yellow  Treat  MEWS Interventions Considered administering scheduled or prn medications/treatments as ordered  Take Vital Signs  Increase Vital Sign Frequency  Yellow: Q2hr x1, continue Q4hrs until patient remains green for 12hrs  Escalate  MEWS: Escalate Yellow: Discuss with charge nurse and consider notifying provider and/or RRT  Notify: Charge Nurse/RN  Name of Charge Nurse/RN Notified Recruitment consultant  Provider Notification  Provider Name/Title Dr Clyde Lundborg  Date Provider Notified 04/01/23  Time Provider Notified 1452  Method of Notification  (secure chat)  Notification Reason Other (Comment) (increased HR)  Provider response See new orders  Date of Provider Response 04/01/23  Time of Provider Response 1053  Assess: SIRS CRITERIA  SIRS Temperature  0  SIRS Pulse 1  SIRS Respirations  0  SIRS WBC 0  SIRS Score Sum  1

## 2023-04-01 NOTE — ED Notes (Signed)
 Dr Niu at bedside 

## 2023-04-01 NOTE — H&P (Signed)
History and Physical    Cheyenne Frank ZDG:[id number] DOB: 11-Oct-1940 DOA: 04/01/2023  Referring MD/NP/PA:   PCP: [facility]   Patient coming from:  The patient is coming from home.     Chief Complaint: SOB  HPI: Cheyenne Frank is a 82 y.o. female with medical history significant of HTN, JHLD, DM,  COPD not on O2, dCHF, stroke, dementia, depression, CKD-4, morbid obesity, atrial fibrillation on Eliquis, subdural hematoma, RLS, fibromyalgia, who presents with shortness of breath.  Per her daughter at the bedside, patient has shortness of breath for more than 2 days, which has been progressively worsening.  He has dry cough, no chest pain, fever or chills.  Patient has wheezing.  Normally patient is not using oxygen at home, but was found to have oxygen desaturation to 87% on room air, which improved to 100% on 3 L oxygen.  Patient does not have nausea, vomiting, diarrhea or abdominal pain.  No symptoms of UTI.   Data reviewed independently and ED Course: pt was found to have negative COVID PCR, WBC 9.8, BNP 524, stable renal function, temperature normal, blood pressure 137/50, heart rate 127, 78, RR 22.  VBG with pH 7.32, CO2 57, O2 88.  Patient is admitted to telemetry bed as inpatient.  Chest x-ray: 1. Bilateral pleural effusions, small but that on the left appears increased since last month. Bilateral lung base atelectasis suspected. 2. Cardiomegaly with increased pulmonary vascularity. Cannot exclude mild or developing interstitial edema. 3. Question underlying Tracheomalacia.  EKG: I have personally reviewed.  Sinus rhythm, QTc 459, LAE, LAD, T wave inversion in the lateral leads and V5-V6.   Review of Systems:   General: no fevers, chills, no body weight gain, has fatigue HEENT: no blurry vision, hearing changes or sore throat Respiratory: has dyspnea, coughing, wheezing CV: no chest pain, no palpitations GI: no nausea, vomiting, abdominal pain,  diarrhea, constipation GU: no dysuria, burning on urination, increased urinary frequency, hematuria  Ext: has leg edema Neuro: no unilateral weakness, numbness, or tingling, no vision change or hearing loss Skin: no rash, no skin tear. MSK: No muscle spasm, no deformity, no limitation of range of movement in spin Heme: No easy bruising.  Travel history: No recent long distant travel.   Allergy:  Allergies  Allergen Reactions   Codeine Palpitations    Other Reaction: RACES HEART   Dilaudid [Hydromorphone] Other (See Comments) and Rash    Other Reaction: lightheaded, altered ms, nervous, Paranoia paranoid Other Reaction: lightheaded, altered ms, nervous, Paranoia   Demerol [Meperidine] Other (See Comments) and Rash    agitated Agitation   Pravastatin Other (See Comments)    Muscle aches   Amiodarone Other (See Comments)    Per family pt had twitching while taking and cardiologist took pt off    Keppra [Levetiracetam]    Lipitor [Atorvastatin] Other (See Comments) and Itching    unknown   Nifedipine Other (See Comments)    unknown   Statins Rash    Makes her crazy   Sulfa Antibiotics Rash   Vancomycin Rash    deafness    Past Medical History:  Diagnosis Date   AKI (acute kidney injury) (HCC)    Anemia of chronic disease    Anxiety    Arthritis    Asthma    no problems since 1999 (after moving into a new home)   Chronic airway obstruction (HCC)    Complication of anesthesia    " feel like I'm  dying after I wake up"   Diabetes mellitus without complication (HCC)    Diverticulosis    Dysrhythmia    unknown type    Fibromyalgia    Flat back syndrome    GERD (gastroesophageal reflux disease)    Hx of transfusion 2011   Hypertension    Itching    Macular degeneration    Nocturia    Postoperative anemia due to acute blood loss 04/09/2013   Restless leg syndrome    Sleep apnea    Dx 1990's unable to wear c-pap -   Stroke (HCC) 2010   verbal aphasia x 30 min -  resolved - no problem since then    Past Surgical History:  Procedure Laterality Date   ABDOMINAL HYSTERECTOMY     rt so   BACK SURGERY  2010 / 2011   BREAST REDUCTION SURGERY     BURR HOLE FOR SUBDURAL HEMATOMA  2015   cataracts removed     CHOLECYSTECTOMY  1990   DILATION AND CURETTAGE OF UTERUS     HERNIA REPAIR     POSTERIOR LAMINECTOMY / DECOMPRESSION LUMBAR SPINE     with T10-S1 fusion   TONSILLECTOMY     TOTAL KNEE ARTHROPLASTY Left 04/06/2013   Procedure: LEFT TOTAL KNEE ARTHROPLASTY;  Surgeon: Loanne Drilling, MD;  Location: [facility];  Service: Orthopedics;  Laterality: Left;    Social History:  reports that she has never smoked. She has never used smokeless tobacco. She reports that she does not drink alcohol and does not use drugs.  Family History:  Family History  Problem Relation Age of Onset   COPD Sister        had lung transplant   COPD Brother      Prior to Admission medications   Medication Sig Start Date End Date Taking? Authorizing Provider  insulin lispro (HUMALOG KWIKPEN) 100 UNIT/ML KwikPen Inject 22 Units into the skin 3 (three) times daily. 03/14/23  Yes [provider]  acetaminophen (TYLENOL) 500 MG tablet Take 500-1,000 mg by mouth every 6 (six) hours as needed for mild pain or fever.     [provider]  albuterol (VENTOLIN HFA) 108 (90 Base) MCG/ACT inhaler Inhale 2 puffs into the lungs every 6 (six) hours as needed for wheezing or shortness of breath. 05/19/20   [provider]  apixaban (ELIQUIS) 2.5 MG TABS tablet Take 1 tablet (2.5 mg total) by mouth 2 (two) times daily. 02/23/23 03/25/23  Lucile Shutters, MD  diltiazem (CARDIZEM CD) 180 MG 24 hr capsule Take 1 capsule (180 mg total) by mouth daily. 02/24/23 03/26/23  Lucile Shutters, MD  gabapentin (NEURONTIN) 300 MG capsule Take 1 capsule (300 mg total) by mouth 2 (two) times daily. 02/07/22   Rhetta Mura, MD  guaiFENesin (ROBITUSSIN) 100 MG/5ML liquid Take 5 mLs by  mouth every 4 (four) hours as needed for cough or to loosen phlegm. 02/23/23   Agbata, Tochukwu, MD  insulin glargine (LANTUS) 100 UNIT/ML injection Inject 0.64 mLs (64 Units total) into the skin at bedtime. 11/18/22   Lurene Shadow, MD  insulin lispro (HUMALOG) 100 UNIT/ML injection Inject 0.05 mLs (5 Units total) into the skin 3 (three) times daily with meals. 02/23/23   Lucile Shutters, MD  metoprolol tartrate (LOPRESSOR) 25 MG tablet Take 1 tablet (25 mg total) by mouth 2 (two) times daily. 02/23/23 03/25/23  Lucile Shutters, MD  Multiple Vitamin (MULTIVITAMIN WITH MINERALS) TABS tablet Take 1 tablet by mouth daily.  [provider]  Multiple Vitamins-Minerals (ICAPS AREDS 2 PO) Take 1 capsule by mouth in the morning and at bedtime.    [provider]  omeprazole (PRILOSEC) 40 MG capsule Take 40 mg by mouth daily.     [provider]  oxybutynin (DITROPAN-XL) 10 MG 24 hr tablet Take 10 mg by mouth at bedtime. 10/09/21   [provider]  PARoxetine (PAXIL) 40 MG tablet Take 1 tablet (40 mg total) by mouth at bedtime. 02/07/22   Rhetta Mura, MD  rosuvastatin (CRESTOR) 5 MG tablet Take 2.5 mg by mouth at bedtime.    [provider]  sitaGLIPtin (JANUVIA) 50 MG tablet Take 50 mg by mouth daily.    [provider]  torsemide (DEMADEX) 20 MG tablet Take 1 tablet (20 mg total) by mouth daily. Please start from 11/01/2021 10/30/21   Arnetha Courser, MD  traZODone (DESYREL) 50 MG tablet Take 1.5 tablets (75 mg total) by mouth at bedtime. 11/18/22   Lurene Shadow, MD    Physical Exam: Vitals:   04/01/23 1100 04/01/23 1159 04/01/23 1451 04/01/23 1707  BP: 123/60  117/84 127/62  Pulse: (!) 108 98 (!) 128 87  Resp: 20 18  17   Temp: 98.5 F (36.9 C)     TempSrc: Oral     SpO2: 93% 91% 98% 97%  Weight:      Height:       General: Not in acute distress HEENT:       Eyes: PERRL, EOMI, no jaundice       ENT: No discharge from the ears and nose, no  pharynx injection, no tonsillar enlargement.        Neck: Difficult to assess JVD due to morbid obesity, no bruit, no mass felt. Heme: No neck lymph node enlargement. Cardiac: S1/S2, RRR, No murmurs, No gallops or rubs. Respiratory: Has wheezing and fine crackles bilaterally GI: Soft, nondistended, nontender, no rebound pain, no organomegaly, BS present. GU: No hematuria Ext: 1+ pitting leg edema bilaterally. 1+DP/PT pulse bilaterally. Musculoskeletal: No joint deformities, No joint redness or warmth, no limitation of ROM in spin. Skin: No rashes.  Neuro: Alert, oriented X3, cranial nerves II-XII grossly intact, moves all extremities normally. Psych: Patient is not psychotic, no suicidal or hemocidal ideation.  Labs on Admission: I have personally reviewed following labs and imaging studies  CBC: Recent Labs  Lab 04/01/23 0347  WBC 9.8  NEUTROABS 7.0  HGB 11.3*  HCT 36.6  MCV 93.4  PLT 163   Basic Metabolic Panel: Recent Labs  Lab 04/01/23 0425  NA 140  K 4.7  CL 105  CO2 31  GLUCOSE 135*  BUN 56*  CREATININE 2.12*  CALCIUM 8.7*   GFR: Estimated Creatinine Clearance: 24.3 mL/min (A) (by C-G formula based on SCr of 2.12 mg/dL (H)). Liver Function Tests: No results for input(s): "AST", "ALT", "ALKPHOS", "BILITOT", "PROT", "ALBUMIN" in the last 168 hours. No results for input(s): "LIPASE", "AMYLASE" in the last 168 hours. No results for input(s): "AMMONIA" in the last 168 hours. Coagulation Profile: No results for input(s): "INR", "PROTIME" in the last 168 hours. Cardiac Enzymes: No results for input(s): "CKTOTAL", "CKMB", "CKMBINDEX", "TROPONINI" in the last 168 hours. BNP (last 3 results) No results for input(s): "PROBNP" in the last 8760 hours. HbA1C: No results for input(s): "HGBA1C" in the last 72 hours. CBG: Recent Labs  Lab 04/01/23 0815 04/01/23 1156 04/01/23 1708  GLUCAP 160* 258* 249*   Lipid Profile: No results for input(s): "CHOL", "  HDL",  "LDLCALC", "TRIG", "CHOLHDL", "LDLDIRECT" in the last 72 hours. Thyroid Function Tests: No results for input(s): "TSH", "T4TOTAL", "FREET4", "T3FREE", "THYROIDAB" in the last 72 hours. Anemia Panel: No results for input(s): "VITAMINB12", "FOLATE", "FERRITIN", "TIBC", "IRON", "RETICCTPCT" in the last 72 hours. Urine analysis:    Component Value Date/Time   COLORURINE YELLOW (A) 02/19/2023 2110   APPEARANCEUR HAZY (A) 02/19/2023 2110   APPEARANCEUR Clear 06/29/2014 1138   LABSPEC 1.033 (H) 02/19/2023 2110   LABSPEC 1.019 06/29/2014 1138   PHURINE 5.0 02/19/2023 2110   GLUCOSEU NEGATIVE 02/19/2023 2110   GLUCOSEU Negative 06/29/2014 1138   HGBUR NEGATIVE 02/19/2023 2110   BILIRUBINUR NEGATIVE 02/19/2023 2110   BILIRUBINUR Negative 06/29/2014 1138   KETONESUR NEGATIVE 02/19/2023 2110   PROTEINUR 100 (A) 02/19/2023 2110   UROBILINOGEN 0.2 12/14/2013 1737   NITRITE NEGATIVE 02/19/2023 2110   LEUKOCYTESUR NEGATIVE 02/19/2023 2110   LEUKOCYTESUR Negative 06/29/2014 1138   Sepsis Labs: @LABRCNTIP (procalcitonin:4,lacticidven:4) ) Recent Results (from the past 240 hour(s))  SARS Coronavirus 2 by RT PCR (hospital order, performed in [facility] hospital lab) *cepheid single result test* Anterior Nasal Swab     Status: None   Collection Time: 04/01/23  3:47 AM   Specimen: Anterior Nasal Swab  Result Value Ref Range Status   SARS Coronavirus 2 by RT PCR NEGATIVE NEGATIVE Final    Comment: (NOTE) SARS-CoV-2 target nucleic acids are NOT DETECTED.  The SARS-CoV-2 RNA is generally detectable in upper and lower respiratory specimens during the acute phase of infection. The lowest concentration of SARS-CoV-2 viral copies this assay can detect is 250 copies / mL. A negative result does not preclude SARS-CoV-2 infection and should not be used as the sole basis for treatment or other patient management decisions.  A negative result may occur with improper specimen collection / handling,  submission of specimen other than nasopharyngeal swab, presence of viral mutation(s) within the areas targeted by this assay, and inadequate number of viral copies (<250 copies / mL). A negative result must be combined with clinical observations, patient history, and epidemiological information.  Fact Sheet for Patients:   RoadLapTop.co.za  Fact Sheet for Healthcare Providers: http://kim-miller.com/  This test is not yet approved or  cleared by the [place] FDA and has been authorized for detection and/or diagnosis of SARS-CoV-2 by FDA under an Emergency Use Authorization (EUA).  This EUA will remain in effect (meaning this test can be used) for the duration of the COVID-19 declaration under Section 564(b)(1) of the Act, 21 U.S.C. section 360bbb-3(b)(1), unless the authorization is terminated or revoked sooner.  Performed at [facility], [address] [place], [place] [address]      Radiological Exams on Admission: DG Chest Portable 1 View  Result Date: 04/01/2023 CLINICAL DATA:  82 year old female with shortness of breath. Undergoing breathing treatment. EXAM: PORTABLE CHEST 1 VIEW COMPARISON:  Portable chest 02/23/2023. Chest CTA 02/19/2023 and earlier. FINDINGS: Portable AP upright view at 0356 hours. Left lung veiling opacity has mildly increased since last month. Bilateral layering pleural effusions demonstrated on CTA at that time and pleural effusion on the right also likely persists, with patchy associated opacity most resembling atelectasis. Stable mild cardiomegaly and other mediastinal contours. Mildly irregular tracheal air column appears stable from previous exams and was atelectatic on the CTA last month. No superimposed pneumothorax. Pulmonary vascularity does appear mildly increased. No air bronchograms. Paucity of bowel gas. Chronic lower spinal fusion hardware. IMPRESSION: 1. Bilateral pleural effusions, small  but that  on the left appears increased since last month. Bilateral lung base atelectasis suspected. 2. Cardiomegaly with increased pulmonary vascularity. Cannot exclude mild or developing interstitial edema. 3. Question underlying Tracheomalacia. Electronically Signed   By: Odessa Fleming M.D.   On: 04/01/2023 04:07      Assessment/Plan Principal Problem:   COPD exacerbation (HCC) Active Problems:   Acute on chronic diastolic CHF (congestive heart failure) (HCC)   Atrial fibrillation with rapid ventricular response (HCC)   HTN (hypertension)   Stroke (HCC)   Stage 3b chronic kidney disease (CKD) (HCC)   HLD (hyperlipidemia)   Obesity, Class III, BMI 40-49.9 (morbid obesity) (HCC)   Type II diabetes mellitus with renal manifestations (HCC)   Assessment and Plan:  COPD exacerbation [facility]): Patient has cough, shortness of breath, wheezing, consistent with COPD exacerbation.  Patient has a 3 L new oxygen requirement.  No fever or leukocytosis clinically does not seem to have pneumonia.  - will admit to tele bed as inpatient -Bronchodilators -Patient received 2 g of magnesium sulfate in ED -Solu-Medrol 60 mg IV bid -Oral azithromycin (patient received 1 dose of Rocephin and 1 dose of IV azithromycin in ED) -Mucinex for cough  -Incentive spirometry -sputum culture -Nasal cannula oxygen as needed to maintain O2 saturation 93% or greater  Acute on chronic diastolic CHF (congestive heart failure) (HCC): 2D echo on 09/10/2022 showed EF of 70-75% with grade 2 diastolic dysfunction.  Patient has 1+ leg edema, elevated BNP 524, clinically consistent with CHF exacerbation. -Lasix 40 mg bid by IV -Daily weights -strict I/O's -Low salt diet -Fluid restriction -As needed bronchodilators for shortness of breath  Atrial fibrillation with rapid ventricular response (HCC): Heart rate up to 127 -Eliquis -Cardizem -As needed IV metoprolol for heart rate> 125  HTN (hypertension) -IV hydralazine as  needed -Cardizem  Stroke (HCC) -Crestor -Patient is on Eliquis due to A-fib  Stage 3b chronic kidney disease (CKD) (HCC): Close to recent baseline creatinine.  Recent baseline creatinine 2.47 on 02/23/2023.  Her creatinine is at 2.12, BUN 56, GFR 23 -Follow-up  renal function by BMP  HLD (hyperlipidemia) -Crestor  Obesity, Class III, BMI 40-49.9 (morbid obesity) (HCC): Body weight 110 kg, BMI 44.35 -Encourage losing weight -Exercise and healthy diet  Diabetes mellitus with renal complications: Recent A1c 10.2, poorly controlled.  Patient taking NovoLog, Januvia, Lantus 64 units daily -Sliding scale insulin -Lantus 45 units daily    DVT ppx: on Eliquis  Code Status: DNR per pt and her daughter  Family Communication: Yes, patient's daughter   at bed side.  Disposition Plan:  Anticipate discharge back to previous environment  Consults called:  none  Admission status and Level of care: Telemetry Medical:   as inpt       Dispo: The patient is from: Home              Anticipated d/c is to: Home              Anticipated d/c date is: 2 days              Patient currently is not medically stable to d/c.    Severity of Illness:  The appropriate patient status for this patient is INPATIENT. Inpatient status is judged to be reasonable and necessary in order to provide the required intensity of service to ensure the patient's safety. The patient's presenting symptoms, physical exam findings, and initial radiographic and laboratory data in the context of their chronic comorbidities is felt to place  them at high risk for further clinical deterioration. Furthermore, it is not anticipated that the patient will be medically stable for discharge from the hospital within 2 midnights of admission.   * I certify that at the point of admission it is my clinical judgment that the patient will require inpatient hospital care spanning beyond 2 midnights from the point of admission due to high  intensity of service, high risk for further deterioration and high frequency of surveillance required.*       Date of Service 04/01/2023    Lorretta Harp [facility]   If 7PM-7AM, please contact night-coverage [email or web address] 04/01/2023, 6:58 PM

## 2023-04-02 ENCOUNTER — Inpatient Hospital Stay: Payer: Medicare Other

## 2023-04-02 ENCOUNTER — Other Ambulatory Visit: Payer: Commercial Managed Care - PPO

## 2023-04-02 DIAGNOSIS — J441 Chronic obstructive pulmonary disease with (acute) exacerbation: Secondary | ICD-10-CM | POA: Diagnosis not present

## 2023-04-02 LAB — CBC
HCT: 35.9 % — ABNORMAL LOW (ref 36.0–46.0)
Hemoglobin: 10.8 g/dL — ABNORMAL LOW (ref 12.0–15.0)
MCH: 28.3 pg (ref 26.0–34.0)
MCHC: 30.1 g/dL (ref 30.0–36.0)
MCV: 94 fL (ref 80.0–100.0)
Platelets: 139 10*3/uL — ABNORMAL LOW (ref 150–400)
RBC: 3.82 MIL/uL — ABNORMAL LOW (ref 3.87–5.11)
RDW: 15.3 % (ref 11.5–15.5)
WBC: 9.9 10*3/uL (ref 4.0–10.5)
nRBC: 0 % (ref 0.0–0.2)

## 2023-04-02 LAB — IRON AND TIBC
Iron: 29 ug/dL (ref 28–170)
Saturation Ratios: 8 % — ABNORMAL LOW (ref 10.4–31.8)
TIBC: 356 ug/dL (ref 250–450)
UIBC: 327 ug/dL

## 2023-04-02 LAB — BASIC METABOLIC PANEL
Anion gap: 11 (ref 5–15)
BUN: 73 mg/dL — ABNORMAL HIGH (ref 8–23)
CO2: 29 mmol/L (ref 22–32)
Calcium: 8.6 mg/dL — ABNORMAL LOW (ref 8.9–10.3)
Chloride: 100 mmol/L (ref 98–111)
Creatinine, Ser: 2.25 mg/dL — ABNORMAL HIGH (ref 0.44–1.00)
GFR, Estimated: 21 mL/min — ABNORMAL LOW (ref >60)
Glucose, Bld: 302 mg/dL — ABNORMAL HIGH (ref 70–99)
Potassium: 5.3 mmol/L — ABNORMAL HIGH (ref 3.5–5.1)
Sodium: 140 mmol/L (ref 135–145)

## 2023-04-02 LAB — VITAMIN D 25 HYDROXY (VIT D DEFICIENCY, FRACTURES): Vit D, 25-Hydroxy: 33.75 ng/mL (ref 30–100)

## 2023-04-02 LAB — GLUCOSE, CAPILLARY
Glucose-Capillary: 317 mg/dL — ABNORMAL HIGH (ref 70–99)
Glucose-Capillary: 383 mg/dL — ABNORMAL HIGH (ref 70–99)
Glucose-Capillary: 332 mg/dL — ABNORMAL HIGH (ref 70–99)

## 2023-04-02 LAB — FOLATE: Folate: 35 ng/mL (ref >5.9)

## 2023-04-02 MED ORDER — INSULIN GLARGINE-YFGN 100 UNIT/ML ~~LOC~~ SOLN
65.0000 [IU] | Freq: Every day | SUBCUTANEOUS | Status: DC
  Administered 2023-04-02: 65 [IU] via SUBCUTANEOUS
  Filled 2023-04-02: qty 0.65

## 2023-04-02 MED ORDER — SODIUM ZIRCONIUM CYCLOSILICATE 10 G PO PACK
10.0000 g | PACK | Freq: Three times a day (TID) | ORAL | Status: AC
  Administered 2023-04-02 (×2): 10 g via ORAL
  Filled 2023-04-02 (×2): qty 1

## 2023-04-02 MED ORDER — FLUTICASONE FUROATE-VILANTEROL 200-25 MCG/ACT IN AEPB
1.0000 | INHALATION_SPRAY | Freq: Every day | RESPIRATORY_TRACT | Status: DC
  Administered 2023-04-02 – 2023-04-05 (×4): 1 via RESPIRATORY_TRACT
  Filled 2023-04-02: qty 28

## 2023-04-02 MED ORDER — PREDNISONE 20 MG PO TABS
20.0000 mg | ORAL_TABLET | Freq: Every day | ORAL | Status: AC
  Administered 2023-04-06 – 2023-04-08 (×3): 20 mg via ORAL
  Filled 2023-04-02 (×3): qty 1

## 2023-04-02 MED ORDER — GUAIFENESIN ER 600 MG PO TB12: 600.0000 mg | ORAL_TABLET | Freq: Two times a day (BID) | ORAL | Status: DC

## 2023-04-02 MED ORDER — METHYLPREDNISOLONE SODIUM SUCC 40 MG IJ SOLR: 40.0000 mg | Freq: Two times a day (BID) | INTRAMUSCULAR | Status: DC

## 2023-04-02 MED ORDER — SODIUM CHLORIDE 0.9 % IV SOLN
200.0000 mg | Freq: Every day | INTRAVENOUS | Status: AC
  Administered 2023-04-02 – 2023-04-04 (×3): 200 mg via INTRAVENOUS
  Filled 2023-04-02 (×3): qty 200

## 2023-04-02 MED ORDER — POLYSACCHARIDE IRON COMPLEX 150 MG PO CAPS: 150.0000 mg | ORAL_CAPSULE | Freq: Every day | ORAL | Status: DC

## 2023-04-02 MED ORDER — INSULIN GLARGINE-YFGN 100 UNIT/ML ~~LOC~~ SOLN
20.0000 [IU] | Freq: Once | SUBCUTANEOUS | Status: AC
  Administered 2023-04-02: 20 [IU] via SUBCUTANEOUS
  Filled 2023-04-02: qty 0.2

## 2023-04-02 MED ORDER — PREDNISONE 10 MG PO TABS: 10.0000 mg | ORAL_TABLET | Freq: Every day | ORAL | Status: DC

## 2023-04-02 MED ORDER — GUAIFENESIN ER 600 MG PO TB12
600.0000 mg | ORAL_TABLET | Freq: Two times a day (BID) | ORAL | Status: AC
  Administered 2023-04-02 – 2023-04-06 (×10): 600 mg via ORAL
  Filled 2023-04-02 (×10): qty 1

## 2023-04-02 MED ORDER — PREDNISONE 20 MG PO TABS
30.0000 mg | ORAL_TABLET | Freq: Every day | ORAL | Status: AC
  Administered 2023-04-03 – 2023-04-05 (×3): 30 mg via ORAL
  Filled 2023-04-02 (×3): qty 1

## 2023-04-02 NOTE — Inpatient Diabetes Management (Addendum)
Inpatient Diabetes Program Recommendations  AACE/ADA: New Consensus Statement on Inpatient Glycemic Control (2015)  Target Ranges:  Prepandial:   less than 140 mg/dL      Peak postprandial:   less than 180 mg/dL (1-2 hours)      Critically ill patients:  140 - 180 mg/dL   Lab Results  Component Value Date   GLUCAP 279 (H) 04/01/2023   HGBA1C 10.2 (H) 02/20/2023    Review of Glycemic Control  Latest Reference Range & Units 04/01/23 17:08 04/01/23 20:56  Glucose-Capillary 70 - 99 mg/dL 161 (H) 096 (H)  (H): Data is abnormally high  Diabetes history: DM2 Outpatient Diabetes medications:  Lantus 64 units every day Humalog 22 units TID Januvia 50 mg QD Current orders for Inpatient glycemic control:  Semglee 45 units every day Novolog 0-9 units TID and 0-5 units at bedtime Solumedrol 40 mg Q12H  Inpatient Diabetes Program Recommendations:    If steroids continue, please consider:  1) Novolog 0-20 units TID 2) Semglee 64 units every day (home dose)  Will continue to follow while inpatient.  Thank you, Dulce Sellar, MSN, CDCES Diabetes Coordinator Inpatient Diabetes Program [phone] (team pager from 8a-5p)

## 2023-04-02 NOTE — Progress Notes (Addendum)
[facility] Progress Note  Patient: Cheyenne Frank    ZOX:[id number]  DOA: 04/01/2023     Date of Service: the patient was seen and examined on 04/02/2023  Chief Complaint  Patient presents with   Shortness of Breath    Pt. In via [facility] from home for SOB x 1 day, worse tonight, HX of COPD, EMS gave 2 duoneb and 125 solumedrol PTA   Brief hospital course: Cheyenne Frank is a 82 y.o. female with medical history significant of HTN, JHLD, DM,  COPD not on O2, dCHF, stroke, dementia, depression, CKD-4, morbid obesity, atrial fibrillation on Eliquis, subdural hematoma, RLS, fibromyalgia, who presents with shortness of breath.   Per her daughter at the bedside, patient has shortness of breath for more than 2 days, which has been progressively worsening.  He has dry cough, no chest pain, fever or chills.  Patient has wheezing.  Normally patient is not using oxygen at home, but was found to have oxygen desaturation to 87% on room air, which improved to 100% on 3 L oxygen.  Patient does not have nausea, vomiting, diarrhea or abdominal pain.  No symptoms of UTI.     Data reviewed independently and ED Course: pt was found to have negative COVID PCR, WBC 9.8, BNP 524, stable renal function, temperature normal, blood pressure 137/50, heart rate 127, 78, RR 22.  VBG with pH 7.32, CO2 57, O2 88.  Patient is admitted to telemetry bed as inpatient.   Chest x-ray: 1. Bilateral pleural effusions, small but that on the left appears increased since last month. Bilateral lung base atelectasis suspected. 2. Cardiomegaly with increased pulmonary vascularity. Cannot exclude mild or developing interstitial edema. 3. Question underlying Tracheomalacia.   EKG: Sinus rhythm, QTc 459, LAE, LAD, T wave inversion in the lateral leads and V5-V6.  Assessment and Plan: Principal Problem:   COPD exacerbation (HCC) Active Problems:   Acute on chronic diastolic CHF (congestive heart failure) (HCC)   Atrial  fibrillation with rapid ventricular response (HCC)   HTN (hypertension)   Stroke (HCC)   Stage 3b chronic kidney disease (CKD) (HCC)   HLD (hyperlipidemia)   Obesity, Class III, BMI 40-49.9 (morbid obesity) (HCC)   Type II diabetes mellitus with renal manifestations (HCC)     Assessment and Plan:   COPD exacerbation: Patient has cough, shortness of breath, wheezing, consistent with COPD exacerbation.  Patient has a 3 L new oxygen requirement.  No fever or leukocytosis clinically does not seem to have pneumonia. S/p azithromycin IV given in the ED, started azithromycin 250 mg p.o. daily S/p IV Solu-Medrol, started tapering dose of oral prednisone Continue DuoNeb every 6 hourly scheduled, transition to as needed after improvement Started Breo Ellipta inhaler Mucinex 600 mg p.o. twice daily, Mucinex DM as needed for cough Continue supplemental O2 admission Continue incentive spirometry    Acute on chronic diastolic CHF (congestive heart failure) (HCC): 2D echo on 09/10/2022 showed EF of 70-75% with grade 2 diastolic dysfunction.  Patient has 1+ leg edema, elevated BNP 524, clinically consistent with CHF exacerbation. -Lasix 40 mg IV bid  -Daily weights -strict I/O's -Low salt diet -Fluid restriction -As needed bronchodilators for shortness of breath    Hyperkalemia -Lokelma 10 g every 8 hourly x 2 doses order placed Monitor potassium level daily Low potassium diet  Atrial fibrillation with rapid ventricular response (HCC):  -Eliquis -Cardizem -As needed IV metoprolol for heart rate> 125   HTN (hypertension) -IV hydralazine as needed -Cardizem  Stroke  -Crestor -Patient is on Eliquis due to A-fib   Stage 3b chronic kidney disease (CKD) (HCC): Close to recent baseline creatinine.  Recent baseline creatinine 2.47 on 02/23/2023.   creatinine 2.12--2.25  -Follow-up  renal function by BMP   HLD (hyperlipidemia) -Crestor   Obesity, Class III, BMI 40-49.9 (morbid obesity)  (HCC): Body weight 110 kg, BMI 44.35 -Encourage losing weight -Exercise and healthy diet   Diabetes mellitus with renal complications: Recent A1c 10.2, poorly controlled.  Patient taking NovoLog, Januvia, Lantus 64 units daily -Sliding scale insulin -Lantus 65 units daily (64 u Home dose) Hyperglycemia secondary to steroids Follow diabetic coordinator consult Continue diabetic diet, monitor CBG  Iron deficiency anemia, transferrin saturation 8%, Venofer 20 mg IV x 3 doses followed by oral supplement Folic acid level within normal range  Body mass index is 49.19 kg/m.  Interventions:  Diet: Diabetic and heart healthy diet DVT Prophylaxis: Subcutaneous Lovenox   Advance goals of care discussion: DNR  Family Communication: family was present at bedside, at the time of interview.  The pt provided permission to discuss medical plan with the family. Opportunity was given to ask question and all questions were answered satisfactorily.   Disposition:  Pt is from Home, admitted with COPD and CHF exacerbation, still has shortness of breath, on IV Lasix, which precludes a safe discharge. Discharge to home, when stable, may need few days more to stabilize.  Subjective: No significant events overnight, patient still has shortness of breath but feels improvement as compared to yesterday.  Still has chest congestion and cough, unable to bring up phlegm.  Denies any palpitations or chest pain.  Denies any abdominal pain, no nausea vomiting or diarrhea.  Physical Exam: General: NAD, lying comfortably Appear in no distress, affect appropriate Eyes: PERRLA ENT: Oral Mucosa Clear, moist  Neck: no JVD,  Cardiovascular: S1 and S2 Present, no Murmur,  Respiratory: Equal air entry bilaterally, bibasilar crackles and occasional minimal wheezing. Abdomen: Bowel Sound present, Soft and no tenderness,  Skin: no rashes Extremities: Mild Pedal edema, no calf tenderness Neurologic: without any new focal  findings Gait not checked due to patient safety concerns  Vitals:   04/02/23 0416 04/02/23 0500 04/02/23 0740 04/02/23 0813  BP: (!) 138/59   (!) 145/91  Pulse: 74  (!) 109   Resp: 18  16 16   Temp: 98.1 F (36.7 C)   98.6 F (37 C)  TempSrc:      SpO2: 92%  92% (!) 86%  Weight:  122 kg    Height:        Intake/Output Summary (Last 24 hours) at 04/02/2023 1255 Last data filed at 04/02/2023 0940 Gross per 24 hour  Intake --  Output 250 ml  Net -250 ml   Filed Weights   04/01/23 0340 04/02/23 0500  Weight: 110 kg 122 kg    Data Reviewed: I have personally reviewed and interpreted daily labs, tele strips, imagings as discussed above. I reviewed all nursing notes, pharmacy notes, vitals, pertinent old records I have discussed plan of care as described above with RN and patient/family.  CBC: Recent Labs  Lab 04/01/23 0347 04/02/23 0516  WBC 9.8 9.9  NEUTROABS 7.0  --   HGB 11.3* 10.8*  HCT 36.6 35.9*  MCV 93.4 94.0  PLT 163 139*   Basic Metabolic Panel: Recent Labs  Lab 04/01/23 0425 04/02/23 0516  NA 140 140  K 4.7 5.3*  CL 105 100  CO2 31 29  GLUCOSE 135*  302*  BUN 56* 73*  CREATININE 2.12* 2.25*  CALCIUM 8.7* 8.6*    Studies: No results found.  Scheduled Meds:  apixaban  2.5 mg Oral BID   azithromycin  250 mg Oral Daily   diltiazem  180 mg Oral Daily   furosemide  40 mg Intravenous Q12H   gabapentin  300 mg Oral BID   insulin aspart  0-5 Units Subcutaneous QHS   insulin aspart  0-9 Units Subcutaneous TID WC   insulin glargine-yfgn  20 Units Subcutaneous Once   insulin glargine-yfgn  65 Units Subcutaneous QHS   ipratropium-albuterol  3 mL Nebulization Q6H   multivitamin with minerals  1 tablet Oral Daily   oxybutynin  10 mg Oral QHS   pantoprazole  40 mg Oral Daily   PARoxetine  40 mg Oral QHS   [START ON 04/03/2023] predniSONE  30 mg Oral Q breakfast   Followed by   Melene Muller ON 04/06/2023] predniSONE  20 mg Oral Q breakfast   Followed by    Melene Muller ON 04/09/2023] predniSONE  10 mg Oral Q breakfast   rosuvastatin  2.5 mg Oral QHS   sodium zirconium cyclosilicate  10 g Oral TID   traZODone  75 mg Oral QHS   Continuous Infusions: PRN Meds: acetaminophen, albuterol, dextromethorphan-guaiFENesin, hydrALAZINE, metoprolol tartrate, ondansetron (ZOFRAN) IV  Time spent: 55 minutes  Author: Gillis Santa. MD [facility] 04/02/2023 12:55 PM  To reach On-call, see care teams to locate the attending and reach out to them via [email or web address]. If 7PM-7AM, please contact night-coverage If you still have difficulty reaching the attending provider, please page the [facility] (Director on Call) for [facility] on amion for assistance.

## 2023-04-02 NOTE — Progress Notes (Signed)
PT Cancellation Note  Patient Details Name: Cheyenne Frank MRN: [id number] DOB: 06/11/1941   Cancelled Treatment:    Reason Eval/Treat Not Completed: Fatigue/lethargy limiting ability to participate Patient sleeping, had to shake her to wake up and then patient states she is too sleepy. Asks that we return tomorrow.   Legaci Tarman 04/02/2023, 2:38 PM

## 2023-04-02 NOTE — TOC Initial Note (Signed)
Transition of Care [facility]) - Initial/Assessment Note    Patient Details  Name: Cheyenne Frank MRN: [id number] Date of Birth: February 28, 1941  [facility] CM/SW Contact:    Allena Katz, LCSW Phone Number: 04/02/2023, 12:08 PM  Clinical Narrative:    TOC will follow for current plan updates. Pt is still active with [facility]. Pt admitted from home and is only oriented to self.               HRA completed on 02/22/2023 By Charlynn Court, LCSW.      Readmission prevention screen complete. CSW met with patient. Caregiver at bedside. CSW introduced role and explained that discharge planning would be discussed. PCP is at [facility] in [place]. She does not see a specific provider there. Daughter drives her to appointments. Pharmacy is [facility] in [place]. No issues obtaining medications. Patient lives home with her daughter, son-in-law, and granddaughter. No home health prior to admission although she does have hired caregivers. Caregiver at bedside stated they are with her almost 24 hours per day. Patient has a hospital bed, 3-in-1 (that she uses as an elevated toilet seat), walk-in tub, RW, and wheelchair. PT evaluation is pending. Will follow up once recommendations are made. No further concerns. CSW will continue to follow patient for support and facilitate discharge once medically stable. If she returns home, daughter will transport her.                 2:37 pm: Patient does not have home health agency preference. She was recently discharged from [facility]. CSW called daughter Cheyenne Frank who would like to use them again. Referral accepted for PT. Per MD, will discharge in the morning. Daughter Cheyenne Frank will transport her home.          Patient Goals and CMS Choice            Expected Discharge Plan and Services                                              Prior Living Arrangements/Services                       Activities of Daily  Living Home Assistive Devices/Equipment: Dan Humphreys (specify type) ADL Screening (condition at time of admission) Is the patient deaf or have difficulty hearing?: No Does the patient have difficulty seeing, even when wearing glasses/contacts?: No Does the patient have difficulty concentrating, remembering, or making decisions?: No Does the patient have difficulty dressing or bathing?: No Independently performs ADLs?: No Does the patient have difficulty walking or climbing stairs?: Yes Weakness of Legs: Both Weakness of Arms/Hands: None  Permission Sought/Granted                  Emotional Assessment              Admission diagnosis:  COPD exacerbation (HCC) [J44.1] Acute respiratory failure with hypoxia (HCC) [J96.01] Type 2 diabetes mellitus without complication, with long-term current use of insulin (HCC) [E11.9, Z79.4] Patient Active Problem List   Diagnosis Date Noted   COPD exacerbation (HCC) 04/01/2023   Type II diabetes mellitus with renal manifestations (HCC) 04/01/2023   Acute bronchitis 02/23/2023   Acute on chronic diastolic (congestive) heart failure (HCC) 02/20/2023   Acute kidney injury superimposed on chronic kidney disease (HCC) 02/20/2023  Type 2 diabetes mellitus with peripheral neuropathy (HCC) 02/20/2023   HLD (hyperlipidemia) 02/20/2023   Hyperkalemia 02/20/2023   Atrial fibrillation with rapid ventricular response (HCC) 11/16/2022   Atrial fibrillation (HCC) 11/16/2022   Chest pain 09/09/2022   Elevated serum creatinine 09/09/2022   Polyuria 09/09/2022   Elevated troponin 09/09/2022   Stroke (HCC) 07/17/2022   CKD stage 3 due to type 2 diabetes mellitus (HCC) 07/17/2022   Postlaminectomy syndrome 02/05/2022   Dementia without behavioral disturbance (HCC) 02/02/2022   Acute metabolic encephalopathy 02/01/2022   Seizure disorder (HCC) 01/30/2022   Aphasia 01/26/2022   Hypertensive urgency 01/24/2022   Paroxysmal atrial fibrillation (HCC)  01/24/2022   History of CVA (cerebrovascular accident) 01/24/2022   Hoarseness of voice 10/27/2021   Acute on chronic diastolic CHF (congestive heart failure) (HCC) 10/26/2021   Acute hypoxemic respiratory failure (HCC) 10/26/2021   Atrial flutter with rapid ventricular response (HCC) 09/16/2021   Stage 3b chronic kidney disease (CKD) (HCC) 09/16/2021   TIA (transient ischemic attack) 09/16/2021   COPD (chronic obstructive pulmonary disease) (HCC) 09/16/2021   Depression 09/16/2021   Acute diastolic CHF (congestive heart failure) (HCC)    AKI (acute kidney injury) (HCC)    Obesity, Class III, BMI 40-49.9 (morbid obesity) (HCC)    Atrial fibrillation with RVR (HCC) 05/16/2021   Head injury 12/27/2020   SDH (subdural hematoma) (HCC) 12/11/2013   CVA (cerebral infarction) 12/11/2013   Uncontrolled type 2 diabetes mellitus with hyperglycemia, with long-term current use of insulin (HCC) 04/07/2013   HTN (hypertension) 04/07/2013   Sleep apnea 04/07/2013   OA (osteoarthritis) of knee 04/06/2013   PCP:  [facility]:   [facility] [address], [place] - [address] [place] [address] Phone: [phone] Fax: [phone]     Social Determinants of Health (SDOH) Social History: SDOH Screenings   Food Insecurity: Patient Declined (04/01/2023)  Housing: Patient Declined (04/01/2023)  Transportation Needs: Patient Declined (04/01/2023)  Utilities: Patient Declined (04/01/2023)  Financial Resource Strain: Low Risk  (07/08/2017)   Received from [facility], [facility]  Physical Activity: Inactive (07/08/2017)   Received from [facility], [facility]  Social Connections: Unknown (07/08/2017)   Received from [facility], [facility]  Tobacco Use: Low Risk  (04/01/2023)   SDOH Interventions:     Readmission Risk  Interventions    02/22/2023   10:37 AM 07/18/2022    3:27 PM 01/28/2022    2:28 PM  Readmission Risk Prevention Plan  Transportation Screening Complete Complete Complete  PCP or Specialist Appt within 3-5 Days   Complete  HRI or Home Care Consult   Complete  Social Work Consult for Recovery Care Planning/Counseling   Complete  Palliative Care Screening   Complete  Medication Review Oceanographer) Complete Complete Complete  PCP or Specialist appointment within 3-5 days of discharge Complete Complete   HRI or Home Care Consult  Complete   SW Recovery Care/Counseling Consult Complete Not Complete   SW Consult Not Complete Comments  NA   Palliative Care Screening Not Applicable Not Applicable   Skilled Nursing Facility Not Applicable Not Applicable

## 2023-04-03 ENCOUNTER — Inpatient Hospital Stay (HOSPITAL_COMMUNITY)
Admit: 2023-04-03 | Discharge: 2023-04-03 | Disposition: A | Discharge status: Home / Self Care | Payer: Medicare Other | Attending: Student | Admitting: Student

## 2023-04-03 DIAGNOSIS — I5033 Acute on chronic diastolic (congestive) heart failure: Secondary | ICD-10-CM

## 2023-04-03 DIAGNOSIS — J441 Chronic obstructive pulmonary disease with (acute) exacerbation: Secondary | ICD-10-CM | POA: Diagnosis not present

## 2023-04-03 LAB — ECHOCARDIOGRAM COMPLETE
AR max vel: 2.41 cm2
AV Area VTI: 2.31 cm2
AV Area mean vel: 2.55 cm2
AV Mean grad: 3 mmHg
AV Peak grad: 6.1 mmHg
Ao pk vel: 1.23 m/s
Area-P 1/2: 5.34 cm2
Height: 62 in
MV VTI: 2.31 cm2
S' Lateral: 2.3 cm
Weight: 6045.9 oz

## 2023-04-03 LAB — CBC
HCT: 36.9 % (ref 36.0–46.0)
Hemoglobin: 11.2 g/dL — ABNORMAL LOW (ref 12.0–15.0)
MCH: 28.4 pg (ref 26.0–34.0)
MCHC: 30.4 g/dL (ref 30.0–36.0)
MCV: 93.7 fL (ref 80.0–100.0)
Platelets: 150 10*3/uL (ref 150–400)
RBC: 3.94 MIL/uL (ref 3.87–5.11)
RDW: 15.3 % (ref 11.5–15.5)
WBC: 10.5 10*3/uL (ref 4.0–10.5)
nRBC: 0.2 % (ref 0.0–0.2)

## 2023-04-03 LAB — BASIC METABOLIC PANEL
Anion gap: 11 (ref 5–15)
BUN: 85 mg/dL — ABNORMAL HIGH (ref 8–23)
CO2: 29 mmol/L (ref 22–32)
Calcium: 8.4 mg/dL — ABNORMAL LOW (ref 8.9–10.3)
Chloride: 96 mmol/L — ABNORMAL LOW (ref 98–111)
Creatinine, Ser: 2.35 mg/dL — ABNORMAL HIGH (ref 0.44–1.00)
GFR, Estimated: 20 mL/min — ABNORMAL LOW (ref >60)
Glucose, Bld: 343 mg/dL — ABNORMAL HIGH (ref 70–99)
Potassium: 5.2 mmol/L — ABNORMAL HIGH (ref 3.5–5.1)
Sodium: 136 mmol/L (ref 135–145)

## 2023-04-03 LAB — GLUCOSE, CAPILLARY
Glucose-Capillary: 307 mg/dL — ABNORMAL HIGH (ref 70–99)
Glucose-Capillary: 313 mg/dL — ABNORMAL HIGH (ref 70–99)
Glucose-Capillary: 370 mg/dL — ABNORMAL HIGH (ref 70–99)
Glucose-Capillary: 333 mg/dL — ABNORMAL HIGH (ref 70–99)
Glucose-Capillary: 286 mg/dL — ABNORMAL HIGH (ref 70–99)

## 2023-04-03 LAB — TROPONIN I (HIGH SENSITIVITY)
Troponin I (High Sensitivity): 15 ng/L (ref <18)
Troponin I (High Sensitivity): 15 ng/L (ref <18)

## 2023-04-03 LAB — MAGNESIUM
Magnesium: 3.1 mg/dL — ABNORMAL HIGH (ref 1.7–2.4)
Magnesium: 2.8 mg/dL — ABNORMAL HIGH (ref 1.7–2.4)

## 2023-04-03 LAB — PHOSPHORUS: Phosphorus: 5.9 mg/dL — ABNORMAL HIGH (ref 2.5–4.6)

## 2023-04-03 MED ORDER — DILTIAZEM HCL 25 MG/5ML IV SOLN
10.0000 mg | Freq: Once | INTRAVENOUS | Status: AC
  Administered 2023-04-03: 10 mg via INTRAVENOUS
  Filled 2023-04-03: qty 5

## 2023-04-03 MED ORDER — SODIUM ZIRCONIUM CYCLOSILICATE 10 G PO PACK
10.0000 g | PACK | Freq: Three times a day (TID) | ORAL | Status: AC
  Administered 2023-04-03 (×2): 10 g via ORAL
  Filled 2023-04-03 (×2): qty 1

## 2023-04-03 MED ORDER — DILTIAZEM HCL-DEXTROSE 125-5 MG/125ML-% IV SOLN (PREMIX)
5.0000 mg/h | INTRAVENOUS | Status: AC
  Administered 2023-04-04: 5 mg/h via INTRAVENOUS
  Filled 2023-04-03: qty 125

## 2023-04-03 MED ORDER — BISACODYL 5 MG PO TBEC: 10.0000 mg | DELAYED_RELEASE_TABLET | Freq: Every day | ORAL | Status: DC | PRN

## 2023-04-03 MED ORDER — METOPROLOL TARTRATE 50 MG PO TABS
50.0000 mg | ORAL_TABLET | Freq: Two times a day (BID) | ORAL | Status: DC
  Administered 2023-04-03 – 2023-04-04 (×2): 50 mg via ORAL
  Filled 2023-04-03 (×2): qty 1

## 2023-04-03 MED ORDER — BISACODYL 10 MG RE SUPP
10.0000 mg | Freq: Every day | RECTAL | Status: DC
  Administered 2023-04-04 – 2023-04-06 (×3): 10 mg via RECTAL
  Filled 2023-04-03 (×4): qty 1

## 2023-04-03 MED ORDER — BISACODYL 5 MG PO TBEC
10.0000 mg | DELAYED_RELEASE_TABLET | Freq: Once | ORAL | Status: AC
  Administered 2023-04-03: 10 mg via ORAL
  Filled 2023-04-03: qty 2

## 2023-04-03 MED ORDER — BISACODYL 10 MG RE SUPP: 10.0000 mg | Freq: Every day | RECTAL | Status: DC | PRN

## 2023-04-03 MED ORDER — INSULIN GLARGINE-YFGN 100 UNIT/ML ~~LOC~~ SOLN
40.0000 [IU] | Freq: Two times a day (BID) | SUBCUTANEOUS | Status: DC
  Administered 2023-04-03 – 2023-04-08 (×11): 40 [IU] via SUBCUTANEOUS
  Filled 2023-04-03 (×12): qty 0.4

## 2023-04-03 MED ORDER — FUROSEMIDE 10 MG/ML IJ SOLN
20.0000 mg | Freq: Two times a day (BID) | INTRAMUSCULAR | Status: DC
  Administered 2023-04-03 – 2023-04-04 (×2): 20 mg via INTRAVENOUS
  Filled 2023-04-03 (×2): qty 2

## 2023-04-03 NOTE — Progress Notes (Signed)
[facility] Progress Note  Patient: Cheyenne Frank    WGN:[id number]  DOA: 04/01/2023     Date of Service: the patient was seen and examined on 04/03/2023  Chief Complaint  Patient presents with   Shortness of Breath    Pt. In via [facility] from home for SOB x 1 day, worse tonight, HX of COPD, EMS gave 2 duoneb and 125 solumedrol PTA   Brief hospital course: Cheyenne Frank is a 82 y.o. female with medical history significant of HTN, JHLD, DM,  COPD not on O2, dCHF, stroke, dementia, depression, CKD-4, morbid obesity, atrial fibrillation on Eliquis, subdural hematoma, RLS, fibromyalgia, who presents with shortness of breath.   Per her daughter at the bedside, patient has shortness of breath for more than 2 days, which has been progressively worsening.  He has dry cough, no chest pain, fever or chills.  Patient has wheezing.  Normally patient is not using oxygen at home, but was found to have oxygen desaturation to 87% on room air, which improved to 100% on 3 L oxygen.  Patient does not have nausea, vomiting, diarrhea or abdominal pain.  No symptoms of UTI.     Data reviewed independently and ED Course: pt was found to have negative COVID PCR, WBC 9.8, BNP 524, stable renal function, temperature normal, blood pressure 137/50, heart rate 127, 78, RR 22.  VBG with pH 7.32, CO2 57, O2 88.  Patient is admitted to telemetry bed as inpatient.   Chest x-ray: 1. Bilateral pleural effusions, small but that on the left appears increased since last month. Bilateral lung base atelectasis suspected. 2. Cardiomegaly with increased pulmonary vascularity. Cannot exclude mild or developing interstitial edema. 3. Question underlying Tracheomalacia.   EKG: Sinus rhythm, QTc 459, LAE, LAD, T wave inversion in the lateral leads and V5-V6.  Assessment and Plan: Principal Problem:   COPD exacerbation (HCC) Active Problems:   Acute on chronic diastolic CHF (congestive heart failure) (HCC)   Atrial  fibrillation with rapid ventricular response (HCC)   HTN (hypertension)   Stroke (HCC)   Stage 3b chronic kidney disease (CKD) (HCC)   HLD (hyperlipidemia)   Obesity, Class III, BMI 40-49.9 (morbid obesity) (HCC)   Type II diabetes mellitus with renal manifestations (HCC)     Assessment and Plan:   COPD exacerbation: Patient has cough, shortness of breath, wheezing, consistent with COPD exacerbation.  Patient has a 3 L new oxygen requirement.  No fever or leukocytosis clinically does not seem to have pneumonia. S/p azithromycin IV given in the ED, started azithromycin 250 mg p.o. daily S/p IV Solu-Medrol, started tapering dose of oral prednisone Continue DuoNeb every 6 hourly scheduled, transition to as needed after improvement Started Breo Ellipta inhaler Mucinex 600 mg p.o. twice daily, Mucinex DM as needed for cough Continue supplemental O2 admission Continue incentive spirometry    Acute on chronic diastolic CHF (congestive heart failure) (HCC): 2D echo on 09/10/2022 showed EF of 70-75% with grade 2 diastolic dysfunction.  Patient has 1+ leg edema, elevated BNP 524, clinically consistent with CHF exacerbation. -Lasix 40 mg IV bid  -Daily weights -strict I/O's -Low salt diet -Fluid restriction -As needed bronchodilators for shortness of breath    Hyperkalemia 7/16 s/p Lokelma 10 g every 8 hourly x 2 doses but no BM 7/16 Lokelma 10 mg x 2 doses ordered Monitor potassium level daily Low potassium diet  Atrial fibrillation with rapid ventricular response (HCC):  -Eliquis -Cardizem -As needed IV metoprolol for heart rate>  125   HTN (hypertension) -IV hydralazine as needed -Cardizem   Stroke  -Crestor -Patient is on Eliquis due to A-fib   Stage 3b chronic kidney disease (CKD) (HCC): Close to recent baseline creatinine.  Recent baseline creatinine 2.47 on 02/23/2023.   creatinine 2.12--2.25 --2.35 -Follow-up  renal function by BMP   HLD (hyperlipidemia) -Crestor    Obesity, Class III, BMI 40-49.9 (morbid obesity) (HCC): Body weight 110 kg, BMI 44.35 -Encourage losing weight -Exercise and healthy diet   Diabetes mellitus with renal complications: Recent A1c 10.2, poorly controlled.  Patient taking NovoLog, Januvia, Lantus 64 units daily -Sliding scale insulin 7/17 increased Lantus 40 units BID  (64 u Home dose) Hyperglycemia secondary to steroids Follow diabetic coordinator consult Continue diabetic diet, monitor CBG  Iron deficiency anemia, transferrin saturation 8%, Venofer 200 mg IV x 3 doses followed by oral supplement Folic acid level within normal range  Body mass index is 69.11 kg/m.  Interventions:  Diet: Diabetic and heart healthy diet DVT Prophylaxis: Subcutaneous Lovenox   Advance goals of care discussion: DNR  Family Communication: family was present at bedside, at the time of interview.  The pt provided permission to discuss medical plan with the family. Opportunity was given to ask question and all questions were answered satisfactorily.   Disposition:  Pt is from Home, admitted with COPD and CHF exacerbation, still has shortness of breath, on IV Lasix, which precludes a safe discharge. Discharge to home, when stable, may need few days more to stabilize.  Subjective: No significant events overnight, patient is feeling improvement in the shortness of breath, denies any chest pain or palpitation, no any other active issues.  Patient was resting comfortably on the recliner.  Physical Exam: General: NAD, lying comfortably Appear in no distress, affect appropriate Eyes: PERRLA ENT: Oral Mucosa Clear, moist  Neck: no JVD,  Cardiovascular: S1 and S2 Present, no Murmur,  Respiratory: Equal air entry bilaterally, mild bibasilar crackles and No wheezing. Abdomen: Bowel Sound present, Soft and no tenderness,  Skin: no rashes Extremities: Mild Pedal edema, no calf tenderness Neurologic: without any new focal findings Gait not  checked due to patient safety concerns  Vitals:   04/03/23 0750 04/03/23 0838 04/03/23 1027 04/03/23 1217  BP:  121/83  132/79  Pulse: (!) 107 (!) 129  (!) 133  Resp: 14 18  18   Temp:  97.7 F (36.5 C)  97.8 F (36.6 C)  TempSrc:  Oral    SpO2: 97% 99% 97% 100%  Weight:      Height:        Intake/Output Summary (Last 24 hours) at 04/03/2023 1352 Last data filed at 04/03/2023 0438 Gross per 24 hour  Intake 830 ml  Output 1000 ml  Net -170 ml   Filed Weights   04/01/23 0340 04/02/23 0500 04/03/23 0500  Weight: 110 kg 122 kg (!) 171.4 kg    Data Reviewed: I have personally reviewed and interpreted daily labs, tele strips, imagings as discussed above. I reviewed all nursing notes, pharmacy notes, vitals, pertinent old records I have discussed plan of care as described above with RN and patient/family.  CBC: Recent Labs  Lab 04/01/23 0347 04/02/23 0516 04/03/23 0529  WBC 9.8 9.9 10.5  NEUTROABS 7.0  --   --   HGB 11.3* 10.8* 11.2*  HCT 36.6 35.9* 36.9  MCV 93.4 94.0 93.7  PLT 163 139* 150   Basic Metabolic Panel: Recent Labs  Lab 04/01/23 0425 04/02/23 0516 04/03/23 0529  NA  140 140 136  K 4.7 5.3* 5.2*  CL 105 100 96*  CO2 31 29 29   GLUCOSE 135* 302* 343*  BUN 56* 73* 85*  CREATININE 2.12* 2.25* 2.35*  CALCIUM 8.7* 8.6* 8.4*  MG  --   --  3.1*  PHOS  --   --  5.9*    Studies: No results found.  Scheduled Meds:  apixaban  2.5 mg Oral BID   azithromycin  250 mg Oral Daily   diltiazem  180 mg Oral Daily   fluticasone furoate-vilanterol  1 puff Inhalation Daily   furosemide  20 mg Intravenous Q12H   gabapentin  300 mg Oral BID   guaiFENesin  600 mg Oral BID   insulin aspart  0-5 Units Subcutaneous QHS   insulin aspart  0-9 Units Subcutaneous TID WC   insulin glargine-yfgn  40 Units Subcutaneous BID   ipratropium-albuterol  3 mL Nebulization Q6H   [START ON 04/05/2023] iron polysaccharides  150 mg Oral Daily   multivitamin with minerals  1 tablet  Oral Daily   oxybutynin  10 mg Oral QHS   pantoprazole  40 mg Oral Daily   PARoxetine  40 mg Oral QHS   predniSONE  30 mg Oral Q breakfast   Followed by   Melene Muller ON 04/06/2023] predniSONE  20 mg Oral Q breakfast   Followed by   Melene Muller ON 04/09/2023] predniSONE  10 mg Oral Q breakfast   rosuvastatin  2.5 mg Oral QHS   sodium zirconium cyclosilicate  10 g Oral TID   traZODone  75 mg Oral QHS   Continuous Infusions:  iron sucrose 200 mg (04/03/23 1224)   PRN Meds: acetaminophen, albuterol, dextromethorphan-guaiFENesin, hydrALAZINE, metoprolol tartrate, ondansetron (ZOFRAN) IV  Time spent: 55 minutes  Author: Gillis Santa. MD [facility] 04/03/2023 1:52 PM  To reach On-call, see care teams to locate the attending and reach out to them via [email or web address]. If 7PM-7AM, please contact night-coverage If you still have difficulty reaching the attending provider, please page the [facility] (Director on Call) for [facility] on amion for assistance.

## 2023-04-03 NOTE — Evaluation (Signed)
Physical Therapy Evaluation Patient Details Name: Cheyenne Frank MRN: [id number] DOB: 1941/07/25 Today's Date: 04/03/2023  History of Present Illness  Cheyenne Frank is a 82 y.o. Caucasian female with medical history significant for asthma, anxiety, type 2 diabetes mellitus, GERD, hypertension, OSA, RLS and CVA, who presented to the ER with acute onset of generalized weakness and palpitations.  She admitted to cough and wheezing without dyspnea.  She has been having lower extremity edema and dyspnea on exertion.  Her heart rate was in the 140s per EMS and systolic BP in the 80s with a blood glucose of 333.  No fever or chills.  No nausea or vomiting or abdominal pain.  No dysuria, oliguria or hematuria or flank pain.  She had a left eye procedure a couple days ago with residual left subconjunctival hemorrhage that she was told should resolve in the next couple of days.  No headache or dizziness or blurred vision.  No paresthesias or focal muscle weakness.   Clinical Impression  Patient received in bed finishing breakfast. Caregiver at bedside. Patient is agreeable to PT/OT evaluation. She requires min/mod A for supine to sit. Min A for sit to stand and mod +1-2 A for safety with step pivot to recliner using RW. Patient required max cues for pursed lip breathing during session as her O2 sats dropped to 85% with supplemental oxygen with mobility.  Patient demonstrating posterior leaning with mobility in standing.  She is limited by weakness and poor balance this session and will continue to benefit from skilled PT to improve mobility, strength and safety.           Assistance Recommended at Discharge Frequent or constant Supervision/Assistance  If plan is discharge home, recommend the following:  Can travel by private vehicle  Help with stairs or ramp for entrance;Assist for transportation;Assistance with cooking/housework;Direct supervision/assist for medications management;A lot of help with walking  and/or transfers;A lot of help with bathing/dressing/bathroom        Equipment Recommendations None recommended by PT  Recommendations for Other Services       Functional Status Assessment Patient has had a recent decline in their functional status and demonstrates the ability to make significant improvements in function in a reasonable and predictable amount of time.     Precautions / Restrictions Precautions Precautions: Fall Restrictions Weight Bearing Restrictions: No      Mobility  Bed Mobility Overal bed mobility: Needs Assistance Bed Mobility: Supine to Sit     Supine to sit: Min assist, HOB elevated, Mod assist     General bed mobility comments: Hand held assist for trunk and LE management, mod A to scoot out to edge of bed    Transfers Overall transfer level: Needs assistance Equipment used: Rolling walker (2 wheels) Transfers: Sit to/from Stand, Bed to chair/wheelchair/BSC Sit to Stand: Min assist   Step pivot transfers: Mod assist, +2 physical assistance, +2 safety/equipment       General transfer comment: Posterior lean, multimodal cueing to facilitate steps, weight shifting, and up right posture.    Ambulation/Gait                  Stairs            Wheelchair Mobility     Tilt Bed    Modified Rankin (Stroke Patients Only)       Balance Overall balance assessment: Needs assistance Sitting-balance support: Feet supported Sitting balance-Leahy Scale: Fair Sitting balance - Comments: Able to sit EOB for  3+ minutes while practicing deep breathing, seems slightly impulsive Postural control: Posterior lean Standing balance support: Bilateral upper extremity supported, Reliant on assistive device for balance   Standing balance comment: Posterior lean and cueing to facilitate step pivot transfer/safety                             Pertinent Vitals/Pain Pain Assessment Pain Assessment: No/denies pain    Home Living  Family/patient expects to be discharged to:: Private residence Living Arrangements: Non-relatives/Friends Available Help at Discharge: Friend(s);Available 24 hours/day Type of Home: House Home Access: Ramped entrance     Alternate Level Stairs-Number of Steps: Ramp Home Layout: Two level;Able to live on main level with bedroom/bathroom Home Equipment: Rolling Walker (2 wheels);Wheelchair - manual;BSC/3in1;Shower seat - built in;Other (comment);Hospital bed Additional Comments: Hospital bed, trapeze bar,    Prior Function Prior Level of Function : Needs assist       Physical Assist : Mobility (physical);ADLs (physical) Mobility (physical): Bed mobility;Transfers;Gait ADLs (physical): Grooming;Bathing;Dressing;Toileting;IADLs Mobility Comments: caregiver reports pt primarily uses w/c at home, has been working on walking with RW & HHPT once/week, requires assistance for stand pivot transfers, intermittent assistance for bed mobility even with hospital bed & trapeze bar ADLs Comments: wears depends, assistance for peri hygiene & bathing/dressing.     Hand Dominance   Dominant Hand: Right    Extremity/Trunk Assessment   Upper Extremity Assessment Upper Extremity Assessment: Overall WFL for tasks assessed    Lower Extremity Assessment Lower Extremity Assessment: Generalized weakness    Cervical / Trunk Assessment Cervical / Trunk Assessment: Normal  Communication   Communication: HOH  Cognition Arousal/Alertness: Awake/alert Behavior During Therapy: WFL for tasks assessed/performed Overall Cognitive Status: Impaired/Different from baseline Area of Impairment: Memory                     Memory: Decreased short-term memory         General Comments: Difficult to assess 2/2 HoH        General Comments      Exercises     Assessment/Plan    PT Assessment Patient needs continued PT services  PT Problem List Decreased strength;Cardiopulmonary status limiting  activity;Decreased activity tolerance;Decreased balance;Decreased mobility;Decreased safety awareness;Decreased cognition;Obesity       PT Treatment Interventions Therapeutic exercise;Gait training;Functional mobility training;Therapeutic activities;Patient/family education;Balance training;Cognitive remediation;DME instruction    PT Goals (Current goals can be found in the Care Plan section)  Acute Rehab PT Goals Patient Stated Goal: to return home with continued HHPT PT Goal Formulation: With patient/family Time For Goal Achievement: 04/17/23 Potential to Achieve Goals: Good    Frequency Min 1X/week     Co-evaluation PT/OT/SLP Co-Evaluation/Treatment: Yes Reason for Co-Treatment: To address functional/ADL transfers;For patient/therapist safety PT goals addressed during session: Mobility/safety with mobility;Balance;Proper use of DME         AM-PAC PT "6 Clicks" Mobility  Outcome Measure Help needed turning from your back to your side while in a flat bed without using bedrails?: A Lot Help needed moving from lying on your back to sitting on the side of a flat bed without using bedrails?: A Lot Help needed moving to and from a bed to a chair (including a wheelchair)?: A Lot Help needed standing up from a chair using your arms (e.g., wheelchair or bedside chair)?: A Little Help needed to walk in hospital room?: A Lot Help needed climbing 3-5 steps with a railing? : Total 6  Click Score: 12    End of Session Equipment Utilized During Treatment: Gait belt;Oxygen Activity Tolerance: Patient tolerated treatment well Patient left: in chair;with call bell/phone within reach;with family/visitor present Nurse Communication: Mobility status PT Visit Diagnosis: Unsteadiness on feet (R26.81);Other abnormalities of gait and mobility (R26.89);Muscle weakness (generalized) (M62.81);Difficulty in walking, not elsewhere classified (R26.2)    Time: 1610-9604 PT Time Calculation (min) (ACUTE  ONLY): 8 min   Charges:   PT Evaluation $PT Eval Moderate Complexity: 1 Mod   PT General Charges $$ ACUTE PT VISIT: 1 Visit         Nevin Kozuch, PT, GCS 04/03/23,10:39 AM

## 2023-04-03 NOTE — Evaluation (Signed)
Occupational Therapy Evaluation Patient Details Name: Cheyenne BEEN MRN: [id number] DOB: Nov Frank, Cheyenne Frank Today's Date: 04/03/2023   History of Present Illness Cheyenne Frank is a 82 y.o. Caucasian female with medical history significant for asthma, anxiety, type 2 diabetes mellitus, GERD, hypertension, OSA, RLS and CVA, who presented to the ER with acute onset of generalized weakness and palpitations.  She admitted to cough and wheezing without dyspnea.  She has been having lower extremity edema and dyspnea on exertion.  Her heart rate was in the 140s per EMS and systolic BP in the 80s with a blood glucose of 333.  No fever or chills.  No nausea or vomiting or abdominal pain.  No dysuria, oliguria or hematuria or flank pain.  She had a left eye procedure a couple days ago with residual left subconjunctival hemorrhage that she was told should resolve in the next couple of days.  No headache or dizziness or blurred vision.  No paresthesias or focal muscle weakness.   Clinical Impression   Pt was seen for OT evaluation this date. Prior to hospital admission, pt required assistance with ADLs and transitions at home. Pt lives with family and has help from home aids Frank/7. Pt presents to acute OT demonstrating impaired ADL performance and functional mobility 2/2 generalized weakness, SOB, and decreased activity tolerance (See OT problem list for additional functional deficits). Pt currently requires MIN A for bed mobility, MOD A to scoot toward EOB. MOD A for transfer to recliner with RW in place. Multimodal cueing provided due to lateral lean and sequencing. Pt's HR maintain in the low 130's throughout the session. SpO2 - rest 96% - activity 86% - recovery after 3 minutes 96% on 3L [place]. Pt would benefit from skilled OT services to address noted impairments and functional limitations (see below for any additional details) in order to maximize safety and independence while minimizing falls risk and caregiver burden.  Anticipate the need for follow up OT services upon acute hospital DC.       Recommendations for follow up therapy are one component of a multi-disciplinary discharge planning process, led by the attending physician.  Recommendations may be updated based on patient status, additional functional criteria and insurance authorization.   Assistance Recommended at Discharge Frequent or constant Supervision/Assistance  Patient can return home with the following A lot of help with walking and/or transfers;A lot of help with bathing/dressing/bathroom;Assist for transportation;Help with stairs or ramp for entrance;Assistance with feeding    Functional Status Assessment  Patient has had a recent decline in their functional status and demonstrates the ability to make significant improvements in function in a reasonable and predictable amount of time.  Equipment Recommendations  None recommended by OT    Recommendations for Other Services       Precautions / Restrictions Precautions Precautions: Fall Restrictions Weight Bearing Restrictions: No      Mobility Bed Mobility Overal bed mobility: Needs Assistance Bed Mobility: Supine to Sit     Supine to sit: Min assist, HOB elevated     General bed mobility comments: Hand held assist for trunk and LE management    Transfers Overall transfer level: Needs assistance Equipment used: Rolling walker (2 wheels) Transfers: Bed to chair/wheelchair/BSC       Step pivot transfers: Mod assist     General transfer comment: Posterior lean, multimodal cueing to facilitate steps, weight shifting, and up right posture.      Balance Overall balance assessment: Needs assistance Sitting-balance support: Bilateral upper extremity  supported, Feet supported Sitting balance-Leahy Scale: Fair Sitting balance - Comments: Able to sit EOB for 3+ minutes while practicing deep breathing Postural control: Posterior lean Standing balance support: Bilateral  upper extremity supported, Reliant on assistive device for balance Standing balance-Leahy Scale: Poor Standing balance comment: Posterior lean and cueing to facilitate transfer                           ADL either performed or assessed with clinical judgement   ADL Overall ADL's : At baseline;Needs assistance/impaired Eating/Feeding: Set up                                   Functional mobility during ADLs: Minimal assistance;Rolling walker (2 wheels) General ADL Comments: Pt requires assistance for all ADL completion     Vision         Perception     Praxis      Pertinent Vitals/Pain Pain Assessment Pain Assessment: No/denies pain     Hand Dominance Right   Extremity/Trunk Assessment Upper Extremity Assessment Upper Extremity Assessment: Overall WFL for tasks assessed   Lower Extremity Assessment Lower Extremity Assessment: Generalized weakness   Cervical / Trunk Assessment Cervical / Trunk Assessment: Normal   Communication Communication Communication: HOH   Cognition Arousal/Alertness: Awake/alert Behavior During Therapy: WFL for tasks assessed/performed Overall Cognitive Status: Difficult to assess                                 General Comments: Difficult to assess 2/2 [facility]     General Comments       Exercises     Shoulder Instructions      Home Living Family/patient expects to be discharged to:: Private residence Living Arrangements: Non-relatives/Friends Available Help at Discharge: Friend(s);Available Frank hours/day Type of Home: House Home Access: Ramped entrance     Home Layout: Two level;Able to live on main level with bedroom/bathroom Alternate Level Stairs-Number of Steps: Ramp   Bathroom Shower/Tub: Other (comment) (walk in tub)   Bathroom Toilet: Handicapped height Bathroom Accessibility: Yes How Accessible: Accessible via wheelchair Home Equipment: Rolling Walker (2 wheels);Wheelchair -  manual;BSC/3in1;Shower seat - built in;Other (comment);Hospital bed   Additional Comments: Hospital bed, trapeze bar,      Prior Functioning/Environment Prior Level of Function : Needs assist       Physical Assist : Mobility (physical);ADLs (physical) Mobility (physical): Bed mobility;Transfers;Gait ADLs (physical): Grooming;Bathing;Dressing;Toileting;IADLs Mobility Comments: caregiver reports pt primarily uses w/c at home, has been working on walking with RW & HHPT once/week, requires assistance for stand pivot transfers, intermittent assistance for bed mobility even with hospital bed & trapeze bar ADLs Comments: wears depends, assistance for peri hygiene & bathing/dressing.        OT Problem List: Decreased strength;Decreased activity tolerance;Impaired balance (sitting and/or standing)      OT Treatment/Interventions: Self-care/ADL training;Energy conservation;Therapeutic activities;Patient/family education    OT Goals(Current goals can be found in the care plan section) Acute Rehab OT Goals Patient Stated Goal: To feel better OT Goal Formulation: With patient Time For Goal Achievement: 07/31/Frank Potential to Achieve Goals: Fair ADL Goals Pt Will Perform Grooming: with set-up;sitting Pt Will Transfer to Toilet: with min assist;stand pivot transfer;bedside commode Pt Will Perform Toileting - Clothing Manipulation and hygiene: with modified independence;sitting/lateral leans  OT Frequency: Min 1X/week  Co-evaluation   Reason for Co-Treatment: To address functional/ADL transfers;For patient/therapist safety PT goals addressed during session: Mobility/safety with mobility;Balance;Proper use of DME        AM-PAC OT "6 Clicks" Daily Activity     Outcome Measure Help from another person eating meals?: A Little Help from another person taking care of personal grooming?: A Little Help from another person toileting, which includes using toliet, bedpan, or urinal?: A Lot Help  from another person bathing (including washing, rinsing, drying)?: A Little Help from another person to put on and taking off regular upper body clothing?: A Little Help from another person to put on and taking off regular lower body clothing?: A Lot 6 Click Score: 16   End of Session Equipment Utilized During Treatment: Rolling walker (2 wheels);Gait belt;Oxygen Nurse Communication: Mobility status  Activity Tolerance: Patient tolerated treatment well Patient left: in chair;with call bell/phone within reach;with family/visitor present  OT Visit Diagnosis: Unsteadiness on feet (R26.81);Other abnormalities of gait and mobility (R26.89)                Time: 9147-8295 OT Time Calculation (min): 21 min Charges:  OT General Charges $OT Visit: 1 Visit OT Evaluation $OT Eval Moderate Complexity: 1 Mod OT Treatments $Self Care/Home Management : 8-22 mins Thresa Ross, OTS

## 2023-04-03 NOTE — Progress Notes (Signed)
*  PRELIMINARY RESULTS* Echocardiogram 2D Echocardiogram has been performed.  Carolyne Fiscal 04/03/2023, 3:26 PM

## 2023-04-03 NOTE — Inpatient Diabetes Management (Signed)
Inpatient Diabetes Program Recommendations  AACE/ADA: New Consensus Statement on Inpatient Glycemic Control (2015)  Target Ranges:  Prepandial:   less than 140 mg/dL      Peak postprandial:   less than 180 mg/dL (1-2 hours)      Critically ill patients:  140 - 180 mg/dL   Lab Results  Component Value Date   GLUCAP 313 (H) 04/03/2023   HGBA1C 10.2 (H) 02/20/2023    Review of Glycemic Control  Latest Reference Range & Units 04/02/23 08:14 04/02/23 12:31 04/02/23 20:35 04/03/23 08:41 04/03/23 12:14  Glucose-Capillary 70 - 99 mg/dL 782 (H) 956 (H) 213 (H) 307 (H) 313 (H)  (H): Data is abnormally high Diabetes history: DM2 Outpatient Diabetes medications:  Lantus 64 units every day Humalog 22 units TID Januvia 50 mg QD Current orders for Inpatient glycemic control:  Semglee 40 units BID Novolog 0-9 units TID and 0-5 units at bedtime Prednisone 40 mg QAM; tapering  Inpatient Diabetes Program Recommendations:    Noted MD ordered Semglee 40 units BID today.  Please also consider:  Novolog 5 units TID with meals if she consumes at least 50%  Will continue to follow while inpatient.  Thank you, Dulce Sellar, MSN, CDCES Diabetes Coordinator Inpatient Diabetes Program [phone] (team pager from 8a-5p)

## 2023-04-03 NOTE — Progress Notes (Signed)
Patient heart rate is in  130s for most of the shift. Sinus tach per tele tech. Iv metoprolol given. Pt is resting in bed denise any pain or discomfort. Continue on 3 litter oxygen. MD aware.

## 2023-04-03 NOTE — Progress Notes (Signed)
Patient transported to Rm 241 via bed with RN and NT. Patient belongings transported with her. Patient safety maintained during transport.

## 2023-04-03 NOTE — Progress Notes (Addendum)
Report given to Christen,RN receiving this patient in the PCU. Patient going to room 241-A. Patient was made aware of the pending transfer for closer monitoring of her heart rate.

## 2023-04-04 DIAGNOSIS — J441 Chronic obstructive pulmonary disease with (acute) exacerbation: Secondary | ICD-10-CM | POA: Diagnosis not present

## 2023-04-04 LAB — BASIC METABOLIC PANEL
Anion gap: 9 (ref 5–15)
BUN: 84 mg/dL — ABNORMAL HIGH (ref 8–23)
CO2: 33 mmol/L — ABNORMAL HIGH (ref 22–32)
Calcium: 8.7 mg/dL — ABNORMAL LOW (ref 8.9–10.3)
Chloride: 98 mmol/L (ref 98–111)
Creatinine, Ser: 1.85 mg/dL — ABNORMAL HIGH (ref 0.44–1.00)
GFR, Estimated: 27 mL/min — ABNORMAL LOW (ref >60)
Glucose, Bld: 157 mg/dL — ABNORMAL HIGH (ref 70–99)
Potassium: 4.5 mmol/L (ref 3.5–5.1)
Sodium: 140 mmol/L (ref 135–145)

## 2023-04-04 LAB — CBC
HCT: 36.8 % (ref 36.0–46.0)
Hemoglobin: 11.3 g/dL — ABNORMAL LOW (ref 12.0–15.0)
MCH: 27.8 pg (ref 26.0–34.0)
MCHC: 30.7 g/dL (ref 30.0–36.0)
MCV: 90.6 fL (ref 80.0–100.0)
Platelets: 137 10*3/uL — ABNORMAL LOW (ref 150–400)
RBC: 4.06 MIL/uL (ref 3.87–5.11)
RDW: 15.4 % (ref 11.5–15.5)
WBC: 10.6 10*3/uL — ABNORMAL HIGH (ref 4.0–10.5)
nRBC: 0 % (ref 0.0–0.2)

## 2023-04-04 LAB — GLUCOSE, CAPILLARY
Glucose-Capillary: 105 mg/dL — ABNORMAL HIGH (ref 70–99)
Glucose-Capillary: 170 mg/dL — ABNORMAL HIGH (ref 70–99)
Glucose-Capillary: 167 mg/dL — ABNORMAL HIGH (ref 70–99)
Glucose-Capillary: 174 mg/dL — ABNORMAL HIGH (ref 70–99)
Glucose-Capillary: 198 mg/dL — ABNORMAL HIGH (ref 70–99)

## 2023-04-04 LAB — PHOSPHORUS: Phosphorus: 4.1 mg/dL (ref 2.5–4.6)

## 2023-04-04 LAB — MAGNESIUM: Magnesium: 2.9 mg/dL — ABNORMAL HIGH (ref 1.7–2.4)

## 2023-04-04 MED ORDER — FUROSEMIDE 10 MG/ML IJ SOLN
40.0000 mg | Freq: Once | INTRAMUSCULAR | Status: AC
  Administered 2023-04-04: 40 mg via INTRAVENOUS
  Filled 2023-04-04: qty 4

## 2023-04-04 MED ORDER — FUROSEMIDE 20 MG PO TABS
20.0000 mg | ORAL_TABLET | Freq: Every day | ORAL | Status: DC
  Administered 2023-04-05: 20 mg via ORAL
  Filled 2023-04-04: qty 1

## 2023-04-04 MED ORDER — DILTIAZEM HCL 30 MG PO TABS
30.0000 mg | ORAL_TABLET | Freq: Four times a day (QID) | ORAL | Status: DC
  Administered 2023-04-04: 30 mg via ORAL
  Filled 2023-04-04: qty 1

## 2023-04-04 MED ORDER — METOPROLOL TARTRATE 25 MG PO TABS
25.0000 mg | ORAL_TABLET | Freq: Four times a day (QID) | ORAL | Status: DC
  Administered 2023-04-04 – 2023-04-05 (×4): 25 mg via ORAL
  Filled 2023-04-04 (×5): qty 1

## 2023-04-04 MED ORDER — SODIUM CHLORIDE 0.9 % IV SOLN
200.0000 mg | Freq: Every day | INTRAVENOUS | Status: AC
  Administered 2023-04-05 – 2023-04-06 (×2): 200 mg via INTRAVENOUS
  Filled 2023-04-04 (×2): qty 200

## 2023-04-04 MED ORDER — POLYSACCHARIDE IRON COMPLEX 150 MG PO CAPS
150.0000 mg | ORAL_CAPSULE | Freq: Every day | ORAL | Status: DC
  Administered 2023-04-07 – 2023-04-08 (×2): 150 mg via ORAL
  Filled 2023-04-04 (×2): qty 1

## 2023-04-04 MED ORDER — DILTIAZEM HCL 30 MG PO TABS
60.0000 mg | ORAL_TABLET | Freq: Four times a day (QID) | ORAL | Status: DC
  Administered 2023-04-04 – 2023-04-05 (×3): 60 mg via ORAL
  Filled 2023-04-04 (×5): qty 2

## 2023-04-04 NOTE — Plan of Care (Signed)
  Problem: Education: Goal: Ability to describe self-care measures that may prevent or decrease complications (Diabetes Survival Skills Education) will improve Outcome: Progressing Goal: Individualized Educational Video(s) Outcome: Progressing   Problem: Coping: Goal: Ability to adjust to condition or change in health will improve Outcome: Progressing   Problem: Fluid Volume: Goal: Ability to maintain a balanced intake and output will improve Outcome: Progressing   Problem: Health Behavior/Discharge Planning: Goal: Ability to identify and utilize available resources and services will improve Outcome: Progressing Goal: Ability to manage health-related needs will improve Outcome: Progressing   Problem: Metabolic: Goal: Ability to maintain appropriate glucose levels will improve Outcome: Progressing   Problem: Nutritional: Goal: Maintenance of adequate nutrition will improve Outcome: Progressing Goal: Progress toward achieving an optimal weight will improve Outcome: Progressing   Problem: Skin Integrity: Goal: Risk for impaired skin integrity will decrease Outcome: Progressing   Problem: Tissue Perfusion: Goal: Adequacy of tissue perfusion will improve Outcome: Progressing   Problem: Education: Goal: Ability to demonstrate management of disease process will improve Outcome: Progressing Goal: Ability to verbalize understanding of medication therapies will improve Outcome: Progressing Goal: Individualized Educational Video(s) Outcome: Progressing   Problem: Activity: Goal: Capacity to carry out activities will improve Outcome: Progressing   Problem: Cardiac: Goal: Ability to achieve and maintain adequate cardiopulmonary perfusion will improve Outcome: Progressing   Problem: Education: Goal: Knowledge of disease or condition will improve Outcome: Progressing Goal: Knowledge of the prescribed therapeutic regimen will improve Outcome: Progressing Goal:  Individualized Educational Video(s) Outcome: Progressing   Problem: Activity: Goal: Ability to tolerate increased activity will improve Outcome: Progressing Goal: Will verbalize the importance of balancing activity with adequate rest periods Outcome: Progressing   Problem: Respiratory: Goal: Ability to maintain a clear airway will improve Outcome: Progressing Goal: Levels of oxygenation will improve Outcome: Progressing Goal: Ability to maintain adequate ventilation will improve Outcome: Progressing   Problem: Education: Goal: Knowledge of General Education information will improve Description: Including pain rating scale, medication(s)/side effects and non-pharmacologic comfort measures Outcome: Progressing   Problem: Health Behavior/Discharge Planning: Goal: Ability to manage health-related needs will improve Outcome: Progressing   Problem: Clinical Measurements: Goal: Ability to maintain clinical measurements within normal limits will improve Outcome: Progressing Goal: Will remain free from infection Outcome: Progressing Goal: Diagnostic test results will improve Outcome: Progressing Goal: Respiratory complications will improve Outcome: Progressing Goal: Cardiovascular complication will be avoided Outcome: Progressing   Problem: Activity: Goal: Risk for activity intolerance will decrease Outcome: Progressing   Problem: Nutrition: Goal: Adequate nutrition will be maintained Outcome: Progressing   Problem: Coping: Goal: Level of anxiety will decrease Outcome: Progressing   Problem: Elimination: Goal: Will not experience complications related to bowel motility Outcome: Progressing Goal: Will not experience complications related to urinary retention Outcome: Progressing   Problem: Pain Managment: Goal: General experience of comfort will improve Outcome: Progressing   Problem: Safety: Goal: Ability to remain free from injury will improve Outcome:  Progressing   Problem: Skin Integrity: Goal: Risk for impaired skin integrity will decrease Outcome: Progressing

## 2023-04-04 NOTE — Progress Notes (Signed)
[facility] Progress Note  Patient: Cheyenne Frank    QVZ:[id number]  DOA: 04/01/2023     Date of Service: the patient was seen and examined on 04/04/2023  Chief Complaint  Patient presents with   Shortness of Breath    Pt. In via [facility] from home for SOB x 1 day, worse tonight, HX of COPD, EMS gave 2 duoneb and 125 solumedrol PTA   Brief hospital course: Cheyenne Frank is a 82 y.o. female with medical history significant of HTN, JHLD, DM,  COPD not on O2, dCHF, stroke, dementia, depression, CKD-4, morbid obesity, atrial fibrillation on Eliquis, subdural hematoma, RLS, fibromyalgia, who presents with shortness of breath.   Per her daughter at the bedside, patient has shortness of breath for more than 2 days, which has been progressively worsening.  He has dry cough, no chest pain, fever or chills.  Patient has wheezing.  Normally patient is not using oxygen at home, but was found to have oxygen desaturation to 87% on room air, which improved to 100% on 3 L oxygen.  Patient does not have nausea, vomiting, diarrhea or abdominal pain.  No symptoms of UTI.     Data reviewed independently and ED Course: pt was found to have negative COVID PCR, WBC 9.8, BNP 524, stable renal function, temperature normal, blood pressure 137/50, heart rate 127, 78, RR 22.  VBG with pH 7.32, CO2 57, O2 88.  Patient is admitted to telemetry bed as inpatient.   Chest x-ray: 1. Bilateral pleural effusions, small but that on the left appears increased since last month. Bilateral lung base atelectasis suspected. 2. Cardiomegaly with increased pulmonary vascularity. Cannot exclude mild or developing interstitial edema. 3. Question underlying Tracheomalacia.   EKG: Sinus rhythm, QTc 459, LAE, LAD, T wave inversion in the lateral leads and V5-V6.  Assessment and Plan: Principal Problem:   COPD exacerbation (HCC) Active Problems:   Acute on chronic diastolic CHF (congestive heart failure) (HCC)   Atrial  fibrillation with rapid ventricular response (HCC)   HTN (hypertension)   Stroke (HCC)   Stage 3b chronic kidney disease (CKD) (HCC)   HLD (hyperlipidemia)   Obesity, Class III, BMI 40-49.9 (morbid obesity) (HCC)   Type II diabetes mellitus with renal manifestations (HCC)     Assessment and Plan:   Atrial fibrillation with rapid ventricular response Continue Eliquis and Cardizem Continue as needed IV Lopressor 7/18 last night patient developed atrial flutter, Cardizem IV infusion was started. Started Cardizem 30 mg p.o. every 6 hourly which was increased Cardizem 60 mg p.o. every 6 hourly as per cardiology Metoprolol 25 mg p.o. every 6 hourly S/p Cardizem IV infusion, gradually wean off Cardiology consult appreciated  COPD exacerbation: Patient has cough, shortness of breath, wheezing, consistent with COPD exacerbation.  Patient has a 3 L new oxygen requirement.  No fever or leukocytosis clinically does not seem to have pneumonia. S/p azithromycin IV given in the ED, started azithromycin 250 mg p.o. daily x 4 days S/p IV Solu-Medrol, started tapering dose of oral prednisone Continue DuoNeb every 6 hourly scheduled, transition to as needed after improvement Started Breo Ellipta inhaler Mucinex 600 mg p.o. twice daily, Mucinex DM as needed for cough Continue supplemental O2 admission Continue incentive spirometry    Acute on chronic diastolic CHF (congestive heart failure) (HCC): 2D echo on 09/10/2022 showed EF of 70-75% with grade 2 diastolic dysfunction.  Patient has 1+ leg edema, elevated BNP 524, clinically consistent with CHF exacerbation. -s/p Lasix 40 mg  IV bid, decrease Lasix 20 mg p.o. daily from tomorrow -Daily weights -strict I/O's -Low salt diet -Fluid restriction -As needed bronchodilators for shortness of breath   Hyperkalemia, Resolved  7/16 s/p Lokelma 10 g every 8 hourly x 2 doses but no BM 7/16 Lokelma 10 mg x 2 doses ordered Monitor potassium level daily Low  potassium diet    HTN (hypertension) -IV hydralazine as needed -Cardizem and Lopressor Monitor BP and titrate medications accordingly  Stroke  -Crestor -Patient is on Eliquis due to A-fib   Stage 3b chronic kidney disease (CKD) (HCC): Close to recent baseline creatinine.  Recent baseline creatinine 2.47 on 02/23/2023.   creatinine 2.12--2.25 --2.35---1.85 -Follow-up  renal function by BMP   HLD (hyperlipidemia) -Crestor   Obesity, Class III, BMI 40-49.9 (morbid obesity) (HCC): Body weight 110 kg, BMI 44.35 -Encourage losing weight -Exercise and healthy diet   Diabetes mellitus with renal complications: Recent A1c 10.2, poorly controlled.  Patient taking NovoLog, Januvia, Lantus 64 units daily -Sliding scale insulin 7/17 increased Lantus 40 units BID  (64 u Home dose) Hyperglycemia secondary to steroids Follow diabetic coordinator consult Continue diabetic diet, monitor CBG  Iron deficiency anemia, transferrin saturation 8%, Venofer 200 mg IV x 5 doses followed by oral supplement Folic acid level within normal range  Body mass index is 46.39 kg/m.  Interventions:  Diet: Diabetic and heart healthy diet DVT Prophylaxis: Subcutaneous Lovenox   Advance goals of care discussion: DNR  Family Communication: family was present at bedside, at the time of interview.  The pt provided permission to discuss medical plan with the family. Opportunity was given to ask question and all questions were answered satisfactorily.   Disposition:  Pt is from Home, admitted with COPD and CHF exacerbation, still has shortness of breath, on IV Lasix, which precludes a safe discharge. Discharge to home, when stable, may need few days more to stabilize.  Subjective: Overnight patient developed atrial flutter, so patient was started on Cardizem IV infusion and transferred to progressive unit.  Patient denies any chest pain or palpitations in the morning, shortness of breath is getting worse.  Patient  had no concerns. Management plan discussed with family at bedside.  Physical Exam: General: NAD, lying comfortably Appear in no distress, affect appropriate Eyes: PERRLA ENT: Oral Mucosa Clear, moist  Neck: no JVD,  Cardiovascular: Irregular rhythm, no Murmur,  Respiratory: Equal air entry bilaterally, mild bibasilar crackles and No wheezing. Abdomen: Bowel Sound present, Soft and no tenderness,  Skin: no rashes Extremities: Mild Pedal edema, no calf tenderness Neurologic: without any new focal findings Gait not checked due to patient safety concerns  Vitals:   04/04/23 1100 04/04/23 1200 04/04/23 1300 04/04/23 1321  BP:  117/84 (!) 162/101   Pulse: (!) 132 (!) 132 (!) 53   Resp: 20 (!) 24 (!) 21   Temp:  98.4 F (36.9 C)    TempSrc:      SpO2: 95% 98% 92% 93%  Weight:      Height:        Intake/Output Summary (Last 24 hours) at 04/04/2023 1559 Last data filed at 04/04/2023 1456 Gross per 24 hour  Intake 498.34 ml  Output 1150 ml  Net -651.66 ml   Filed Weights   04/03/23 0500 04/03/23 2248 04/04/23 0500  Weight: (!) 171.4 kg 119.4 kg 118.8 kg    Data Reviewed: I have personally reviewed and interpreted daily labs, tele strips, imagings as discussed above. I reviewed all nursing notes, pharmacy notes,  vitals, pertinent old records I have discussed plan of care as described above with RN and patient/family.  CBC: Recent Labs  Lab 04/01/23 0347 04/02/23 0516 04/03/23 0529 04/04/23 0446  WBC 9.8 9.9 10.5 10.6*  NEUTROABS 7.0  --   --   --   HGB 11.3* 10.8* 11.2* 11.3*  HCT 36.6 35.9* 36.9 36.8  MCV 93.4 94.0 93.7 90.6  PLT 163 139* 150 137*   Basic Metabolic Panel: Recent Labs  Lab 04/01/23 0425 04/02/23 0516 04/03/23 0529 04/03/23 2109 04/04/23 0446  NA 140 140 136  --  140  K 4.7 5.3* 5.2*  --  4.5  CL 105 100 96*  --  98  CO2 31 29 29   --  33*  GLUCOSE 135* 302* 343*  --  157*  BUN 56* 73* 85*  --  84*  CREATININE 2.12* 2.25* 2.35*  --  1.85*   CALCIUM 8.7* 8.6* 8.4*  --  8.7*  MG  --   --  3.1* 2.8* 2.9*  PHOS  --   --  5.9*  --  4.1    Studies: No results found.  Scheduled Meds:  apixaban  2.5 mg Oral BID   azithromycin  250 mg Oral Daily   bisacodyl  10 mg Rectal QHS   diltiazem  60 mg Oral Q6H   fluticasone furoate-vilanterol  1 puff Inhalation Daily   furosemide  40 mg Intravenous Once   [START ON 04/05/2023] furosemide  20 mg Oral Daily   gabapentin  300 mg Oral BID   guaiFENesin  600 mg Oral BID   insulin aspart  0-5 Units Subcutaneous QHS   insulin aspart  0-9 Units Subcutaneous TID WC   insulin glargine-yfgn  40 Units Subcutaneous BID   [START ON 04/05/2023] iron polysaccharides  150 mg Oral Daily   metoprolol tartrate  25 mg Oral Q6H   multivitamin with minerals  1 tablet Oral Daily   oxybutynin  10 mg Oral QHS   pantoprazole  40 mg Oral Daily   PARoxetine  40 mg Oral QHS   predniSONE  30 mg Oral Q breakfast   Followed by   Melene Muller ON 04/06/2023] predniSONE  20 mg Oral Q breakfast   Followed by   Melene Muller ON 04/09/2023] predniSONE  10 mg Oral Q breakfast   rosuvastatin  2.5 mg Oral QHS   traZODone  75 mg Oral QHS   Continuous Infusions:  diltiazem (CARDIZEM) infusion 10 mg/hr (04/04/23 1205)   PRN Meds: acetaminophen, albuterol, bisacodyl, bisacodyl, dextromethorphan-guaiFENesin, hydrALAZINE, metoprolol tartrate, ondansetron (ZOFRAN) IV  Time spent: 55 minutes  Author: Gillis Santa. MD [facility] 04/04/2023 3:59 PM  To reach On-call, see care teams to locate the attending and reach out to them via [email or web address]. If 7PM-7AM, please contact night-coverage If you still have difficulty reaching the attending provider, please page the [facility] (Director on Call) for [facility] on amion for assistance.

## 2023-04-04 NOTE — Care Management Important Message (Signed)
Important Message  Patient Details  Name: Cheyenne Frank MRN: [id number] Date of Birth: Feb 10, 1941   Medicare Important Message Given:  Yes     [name] 04/04/2023, 2:23 PM

## 2023-04-04 NOTE — Progress Notes (Addendum)
PT Cancellation Note  Patient Details Name: Cheyenne Frank MRN: [id number] DOB: 12/01/1940   Cancelled Treatment:    Reason Eval/Treat Not Completed: Medical issues which prohibited therapy Patient's HR 130s at rest, BP also elevated. Will hold PT for today.  [name] 04/04/2023, 12:51 PM

## 2023-04-04 NOTE — Consult Note (Signed)
[facility] CLINIC CARDIOLOGY CONSULT NOTE       Patient ID: Cheyenne Frank MRN: [id number] DOB/AGE: [birth year] 82 y.o.  Admit date: 04/01/2023 Referring Physician Dr. Gillis Santa  Primary Physician Dr. Laurine Blazer Primary Cardiologist Dr. Darrold Junker  Reason for Consultation AFL RVR   HPI: Cheyenne Frank is an 59yoF with a PMH of paroxymal atrial flutter (eliquis), DM2, HTN, CKD, hx CVA, morbid obesity (BMI 45 kg/m), memory impairment who presented to [facility] ED 04/01/2023 with shortness of breath for with associated dry cough and wheezing.  Hypoxic on admission requiring 3L by [place].  She is being treated for COPD exacerbation and volume overload.  Cardiology is consulted the afternoon of hospital day 3 for assistance with atrial flutter with RVR.  The majority of the history is obtained from the patient's chart.  She was reportedly short of breath for at least 1 day that worsened the night of presentation, she has a history of COPD and was given DuoNebs and IV Solu-Medrol by EMS for wheezing.  She has been treated with steroids, DuoNebs, and Breo inhalers for her COPD exacerbation as well as IV Lasix for a component of HFpEF exacerbation.  She has a history of paroxysmal atrial flutter rate was elevated to the 120s today, despite a diltiazem infusion that was started overnight for which we are consulted.  At my time of evaluation this afternoon the patient states she feels "fine" she denies chest pain, shortness of breath stating that her breathing is "much better than it was" denies heart racing or palpitations, pain or lower extremity edema.  On telemetry she is in atrial flutter with rate predominantly in the 90s while on a diltiazem infusion running at 10 mg/h.  Labs notable for potassium 4.5, BUN/creatinine improving at 84/1.85 and GFR of 27.  High-sensitivity troponin is negative x 2 overnight at 15, 15.  Iron level when checked 2 days ago was on the low end of normal at 29, saturation ratios were  low at 8%.  She has been given IV iron, with an infusion currently running this afternoon.  Review of systems complete and found to be negative unless listed above     Past Medical History:  Diagnosis Date   AKI (acute kidney injury) (HCC)    Anemia of chronic disease    Anxiety    Arthritis    Asthma    no problems since 1999 (after moving into a new home)   Chronic airway obstruction (HCC)    Complication of anesthesia    " feel like I'm dying after I wake up"   Diabetes mellitus without complication (HCC)    Diverticulosis    Dysrhythmia    unknown type    Fibromyalgia    Flat back syndrome    GERD (gastroesophageal reflux disease)    Hx of transfusion 2011   Hypertension    Itching    Macular degeneration    Nocturia    Postoperative anemia due to acute blood loss 04/09/2013   Restless leg syndrome    Sleep apnea    Dx 1990's unable to wear c-pap -   Stroke (HCC) 2010   verbal aphasia x 30 min - resolved - no problem since then    Past Surgical History:  Procedure Laterality Date   ABDOMINAL HYSTERECTOMY     rt so   BACK SURGERY  2010 / 2011   BREAST REDUCTION SURGERY     BURR HOLE FOR SUBDURAL HEMATOMA  2015  cataracts removed     CHOLECYSTECTOMY  1990   DILATION AND CURETTAGE OF UTERUS     HERNIA REPAIR     POSTERIOR LAMINECTOMY / DECOMPRESSION LUMBAR SPINE     with T10-S1 fusion   TONSILLECTOMY     TOTAL KNEE ARTHROPLASTY Left 04/06/2013   Procedure: LEFT TOTAL KNEE ARTHROPLASTY;  Surgeon: Loanne Drilling, MD;  Location: [facility] ORS;  Service: Orthopedics;  Laterality: Left;    Medications Prior to Admission  Medication Sig Dispense Refill Last Dose   acetaminophen (TYLENOL) 500 MG tablet Take 500-1,000 mg by mouth every 6 (six) hours as needed for mild pain or fever.    prn at unk   albuterol (VENTOLIN HFA) 108 (90 Base) MCG/ACT inhaler Inhale 2 puffs into the lungs every 6 (six) hours as needed for wheezing or shortness of breath.   04/01/2023 at 0200    apixaban (ELIQUIS) 2.5 MG TABS tablet Take 1 tablet (2.5 mg total) by mouth 2 (two) times daily. 60 tablet 0 03/31/2023 at pm   diltiazem (CARDIZEM CD) 180 MG 24 hr capsule Take 1 capsule (180 mg total) by mouth daily. 1 capsule 0 03/31/2023   gabapentin (NEURONTIN) 300 MG capsule Take 1 capsule (300 mg total) by mouth 2 (two) times daily. 6 capsule 0 03/31/2023   guaiFENesin (ROBITUSSIN) 100 MG/5ML liquid Take 5 mLs by mouth every 4 (four) hours as needed for cough or to loosen phlegm. 120 mL 0 03/31/2023   insulin glargine (LANTUS) 100 UNIT/ML injection Inject 0.64 mLs (64 Units total) into the skin at bedtime.   03/31/2023   insulin lispro (HUMALOG KWIKPEN) 100 UNIT/ML KwikPen Inject 22 Units into the skin 3 (three) times daily.   03/31/2023   Multiple Vitamin (MULTIVITAMIN WITH MINERALS) TABS tablet Take 1 tablet by mouth daily.   03/31/2023   Multiple Vitamins-Minerals (ICAPS AREDS 2 PO) Take 1 capsule by mouth in the morning and at bedtime.   03/31/2023   omeprazole (PRILOSEC) 40 MG capsule Take 40 mg by mouth daily.    03/31/2023   oxybutynin (DITROPAN-XL) 10 MG 24 hr tablet Take 10 mg by mouth at bedtime.   03/31/2023   PARoxetine (PAXIL) 40 MG tablet Take 1 tablet (40 mg total) by mouth at bedtime. 6 tablet 0 03/31/2023   rosuvastatin (CRESTOR) 5 MG tablet Take 2.5 mg by mouth at bedtime.   03/31/2023   sitaGLIPtin (JANUVIA) 50 MG tablet Take 50 mg by mouth daily.   03/31/2023   torsemide (DEMADEX) 20 MG tablet Take 1 tablet (20 mg total) by mouth daily. Please start from 11/01/2021 90 tablet 1 03/31/2023   traZODone (DESYREL) 50 MG tablet Take 1.5 tablets (75 mg total) by mouth at bedtime.   03/31/2023   insulin lispro (HUMALOG) 100 UNIT/ML injection Inject 0.05 mLs (5 Units total) into the skin 3 (three) times daily with meals. 10 mL 11    metoprolol tartrate (LOPRESSOR) 25 MG tablet Take 1 tablet (25 mg total) by mouth 2 (two) times daily. 60 tablet 0    Social History   Socioeconomic History    Marital status: Single    Spouse name: Not on file   Number of children: Not on file   Years of education: Not on file   Highest education level: Not on file  Occupational History   Not on file  Tobacco Use   Smoking status: Never   Smokeless tobacco: Never  Substance and Sexual Activity   Alcohol use: No  Alcohol/week: 0.0 standard drinks of alcohol   Drug use: No   Sexual activity: Not Currently  Other Topics Concern   Not on file  Social History Narrative   Not on file   Social Determinants of Health   Financial Resource Strain: Low Risk  (07/08/2017)   Received from [facility], [facility]   Overall Financial Resource Strain (CARDIA)    Difficulty of Paying Living Expenses: Not hard at all  Food Insecurity: Patient Declined (04/01/2023)   Hunger Vital Sign    Worried About Running Out of Food in the Last Year: Patient declined    Ran Out of Food in the Last Year: Patient declined  Transportation Needs: Patient Declined (04/01/2023)   PRAPARE - Administrator, Civil Service (Medical): Patient declined    Lack of Transportation (Non-Medical): Patient declined  Physical Activity: Inactive (07/08/2017)   [facility]   Exercise Vital Sign    Days of Exercise per Week: 0 days    Minutes of Exercise per Session: 0 min  Stress: Not on file  Social Connections: Unknown (07/08/2017)   Received from [facility], [facility]   Social Connection and Isolation Panel [NHANES]    Frequency of Communication with Friends and Family: More than three times a week    Frequency of Social Gatherings with Friends and Family: More than three times a week    Attends Religious Services: Not on file    Active Member of Clubs or Organizations: No    Attends Banker Meetings: Never    Marital Status: Widowed  Intimate Partner Violence:  Patient Declined (04/01/2023)   Humiliation, Afraid, Rape, and Kick questionnaire    Fear of Current or Ex-Partner: Patient declined    Emotionally Abused: Patient declined    Physically Abused: Patient declined    Sexually Abused: Patient declined    Family History  Problem Relation Age of Onset   COPD Sister        had lung transplant   COPD Brother       Intake/Output Summary (Last 24 hours) at 04/04/2023 1546 Last data filed at 04/04/2023 1456 Gross per 24 hour  Intake 498.34 ml  Output 1150 ml  Net -651.66 ml    Vitals:   04/04/23 1100 04/04/23 1200 04/04/23 1300 04/04/23 1321  BP:  117/84 (!) 162/101   Pulse: (!) 132 (!) 132 (!) 53   Resp: 20 (!) 24 (!) 21   Temp:  98.4 F (36.9 C)    TempSrc:      SpO2: 95% 98% 92% 93%  Weight:      Height:        PHYSICAL EXAM General: Pleasant elderly Caucasian female, well nourished, in no acute distress.  Sitting upright in bed. HEENT:  Normocephalic and atraumatic. Neck:  No JVD.  Lungs: Normal respiratory effort on 3L by [place].  Decreased breath sounds bilaterally without appreciable wheezing  heart: Irregular irregular with controlled rate. Normal S1 and S2 without gallops or murmurs.  Abdomen: Non-distended appearing with excess adiposity.  Msk: Normal strength and tone for age. Extremities: Warm and well perfused. No clubbing, cyanosis.  Trace bilateral lower extremity edema.  Chronic appearing brawny hyperpigmentation of both lower legs Neuro: Alert and oriented X 3. Psych:  Answers questions appropriately.   Labs: Basic Metabolic Panel: Recent Labs    04/03/23 0529 04/03/23 2109 04/04/23 0446  NA 136  --  140  K 5.2*  --  4.5  CL 96*  --  98  CO2 29  --  33*  GLUCOSE 343*  --  157*  BUN 85*  --  84*  CREATININE 2.35*  --  1.85*  CALCIUM 8.4*  --  8.7*  MG 3.1* 2.8* 2.9*  PHOS 5.9*  --  4.1   Liver Function Tests: No results for input(s): "AST", "ALT", "ALKPHOS", "BILITOT", "PROT", "ALBUMIN" in the last 72  hours. No results for input(s): "LIPASE", "AMYLASE" in the last 72 hours. CBC: Recent Labs    04/03/23 0529 04/04/23 0446  WBC 10.5 10.6*  HGB 11.2* 11.3*  HCT 36.9 36.8  MCV 93.7 90.6  PLT 150 137*   Cardiac Enzymes: Recent Labs    04/03/23 2109 04/03/23 2207  TROPONINIHS 15 15   BNP: No results for input(s): "BNP" in the last 72 hours. D-Dimer: No results for input(s): "DDIMER" in the last 72 hours. Hemoglobin A1C: No results for input(s): "HGBA1C" in the last 72 hours. Fasting Lipid Panel: No results for input(s): "CHOL", "HDL", "LDLCALC", "TRIG", "CHOLHDL", "LDLDIRECT" in the last 72 hours. Thyroid Function Tests: No results for input(s): "TSH", "T4TOTAL", "T3FREE", "THYROIDAB" in the last 72 hours.  Invalid input(s): "FREET3" Anemia Panel: Recent Labs    04/02/23 0516  FOLATE 35.0  TIBC 356  IRON 29     Radiology: ECHOCARDIOGRAM COMPLETE  Result Date: 04/03/2023    ECHOCARDIOGRAM REPORT   Patient Name:   CESAR ALF Date of Exam: 04/03/2023 Medical Rec #:  [id number]       Height:       62.0 in Accession #:    [id number]      Weight:       377.9 lb Date of Birth:  01/25/1941        BSA:          2.505 m Patient Age:    82 years        BP:           121/83 mmHg Patient Gender: F               HR:           132 bpm. Exam Location:  [facility] Procedure: 2D Echo, Cardiac Doppler and Color Doppler Indications:     CHF  History:         Patient has prior history of Echocardiogram examinations, most                  recent 09/10/2022. CHF, Stroke, TIA and COPD, Arrythmias:Atrial                  Fibrillation and Atrial Flutter, Signs/Symptoms:Chest Pain;                  Risk Factors:Hypertension, Diabetes, Dyslipidemia and Sleep                  Apnea. CKD.  Sonographer:     Mikki Harbor Referring Phys:  [id number] Gillis Santa Diagnosing Phys: Debbe Odea MD  Sonographer Comments: Technically difficult study due to poor echo windows and patient is obese. IMPRESSIONS  1.  Left ventricular ejection fraction, by estimation, is 60 to 65%. The left ventricle has normal function. The left ventricle has no regional wall motion abnormalities. There is mild left ventricular hypertrophy. Indeterminate diastolic filling due to E-A fusion.  2. Right ventricular systolic function is normal. The right  ventricular size is normal. There is normal pulmonary artery systolic pressure.  3. The mitral valve is degenerative. Mild mitral valve regurgitation.  4. The aortic valve is grossly normal. Aortic valve regurgitation is not visualized.  5. The inferior vena cava is normal in size with greater than 50% respiratory variability, suggesting right atrial pressure of 3 mmHg. FINDINGS  Left Ventricle: Left ventricular ejection fraction, by estimation, is 60 to 65%. The left ventricle has normal function. The left ventricle has no regional wall motion abnormalities. The left ventricular internal cavity size was normal in size. There is  mild left ventricular hypertrophy. Indeterminate diastolic filling due to E-A fusion. Right Ventricle: The right ventricular size is normal. No increase in right ventricular wall thickness. Right ventricular systolic function is normal. There is normal pulmonary artery systolic pressure. The tricuspid regurgitant velocity is 2.55 m/s, and  with an assumed right atrial pressure of 8 mmHg, the estimated right ventricular systolic pressure is 34.0 mmHg. Left Atrium: Left atrial size was normal in size. Right Atrium: Right atrial size was normal in size. Pericardium: There is no evidence of pericardial effusion. Mitral Valve: The mitral valve is degenerative in appearance. Mild to moderate mitral annular calcification. Mild mitral valve regurgitation. MV peak gradient, 10.2 mmHg. The mean mitral valve gradient is 4.0 mmHg. Tricuspid Valve: The tricuspid valve is normal in structure. Tricuspid valve regurgitation is not demonstrated. Aortic Valve: The aortic valve is grossly  normal. Aortic valve regurgitation is not visualized. Aortic valve mean gradient measures 3.0 mmHg. Aortic valve peak gradient measures 6.1 mmHg. Aortic valve area, by VTI measures 2.31 cm. Pulmonic Valve: The pulmonic valve was not well visualized. Pulmonic valve regurgitation is not visualized. Aorta: The aortic root is normal in size and structure. Venous: The inferior vena cava is normal in size with greater than 50% respiratory variability, suggesting right atrial pressure of 3 mmHg. IAS/Shunts: No atrial level shunt detected by color flow Doppler.  LEFT VENTRICLE PLAX 2D LVIDd:         4.20 cm LVIDs:         2.30 cm LV PW:         1.60 cm LV IVS:        1.50 cm LVOT diam:     2.00 cm LV SV:         47 LV SV Index:   19 LVOT Area:     3.14 cm  RIGHT VENTRICLE RV Basal diam:  3.40 cm RV Mid diam:    3.70 cm LEFT ATRIUM             Index        RIGHT ATRIUM           Index LA diam:        4.10 cm 1.64 cm/m   RA Area:     19.00 cm LA Vol (A2C):   53.7 ml 21.44 ml/m  RA Volume:   52.50 ml  20.96 ml/m LA Vol (A4C):   80.5 ml 32.13 ml/m LA Biplane Vol: 66.7 ml 26.62 ml/m  AORTIC VALVE                    PULMONIC VALVE AV Area (Vmax):    2.41 cm     PV Vmax:       1.11 m/s AV Area (Vmean):   2.55 cm     PV Peak grad:  4.9 mmHg AV Area (VTI):     2.31 cm AV  Vmax:           123.00 cm/s AV Vmean:          73.650 cm/s AV VTI:            0.206 m AV Peak Grad:      6.1 mmHg AV Mean Grad:      3.0 mmHg LVOT Vmax:         94.20 cm/s LVOT Vmean:        59.700 cm/s LVOT VTI:          0.151 m LVOT/AV VTI ratio: 0.73  AORTA Ao Root diam: 3.70 cm MITRAL VALVE                TRICUSPID VALVE MV Area (PHT): 5.34 cm     TR Peak grad:   26.0 mmHg MV Area VTI:   2.31 cm     TR Vmax:        255.00 cm/s MV Peak grad:  10.2 mmHg MV Mean grad:  4.0 mmHg     SHUNTS MV Vmax:       1.60 m/s     Systemic VTI:  0.15 m MV Vmean:      89.4 cm/s    Systemic Diam: 2.00 cm MV Decel Time: 142 msec MV E velocity: 143.00 cm/s Debbe Odea MD Electronically signed by Debbe Odea MD Signature Date/Time: 04/03/2023/5:14:58 PM    Final    US RENAL  Result Date: 04/02/2023 CLINICAL DATA:  Acute kidney injury EXAM: RENAL / URINARY TRACT ULTRASOUND COMPLETE COMPARISON:  CT 07/11/2020 FINDINGS: Right Kidney: Renal measurements: 10.1 x 5.2 x 5.3 cm = volume: 145.9 mL. Echogenicity within normal limits. No hydronephrosis. No suspicious mass Left Kidney: Renal measurements: 11.4 x 6 x 4 cm = volume: 216.2 mL. Echogenicity within normal limits. No suspicious mass or hydronephrosis. Bladder: Appears normal for degree of bladder distention. Other: Incidental pleural effusions. IMPRESSION: Negative for hydronephrosis Electronically Signed   By: Jasmine Pang M.D.   On: 04/02/2023 15:41   DG Chest Portable 1 View  Result Date: 04/01/2023 CLINICAL DATA:  82 year old female with shortness of breath. Undergoing breathing treatment. EXAM: PORTABLE CHEST 1 VIEW COMPARISON:  Portable chest 02/23/2023. Chest CTA 02/19/2023 and earlier. FINDINGS: Portable AP upright view at 0356 hours. Left lung veiling opacity has mildly increased since last month. Bilateral layering pleural effusions demonstrated on CTA at that time and pleural effusion on the right also likely persists, with patchy associated opacity most resembling atelectasis. Stable mild cardiomegaly and other mediastinal contours. Mildly irregular tracheal air column appears stable from previous exams and was atelectatic on the CTA last month. No superimposed pneumothorax. Pulmonary vascularity does appear mildly increased. No air bronchograms. Paucity of bowel gas. Chronic lower spinal fusion hardware. IMPRESSION: 1. Bilateral pleural effusions, small but that on the left appears increased since last month. Bilateral lung base atelectasis suspected. 2. Cardiomegaly with increased pulmonary vascularity. Cannot exclude mild or developing interstitial edema. 3. Question underlying  Tracheomalacia. Electronically Signed   By: Odessa Fleming M.D.   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 Hunnell is an 10yoF with a PMH of paroxymal atrial flutter (eliquis), DM2, HTN, CKD, hx CVA, morbid obesity (BMI 45 kg/m), memory impairment who presented to [facility] ED 04/01/2023 with shortness of breath for with associated dry cough and wheezing.  Hypoxic on admission requiring 3L by [place].  She is being treated for COPD exacerbation and volume overload.  Cardiology is consulted the afternoon of hospital day 3 for assistance with atrial flutter with RVR.  # COPD exacerbation # Acute hypoxic respiratory failure On steroids, DuoNebs, and Breo inhalers per primary  # Paroxysmal atrial flutter Rates elevated overnight requiring diltiazem infusion, better controlled this afternoon but remains in atrial flutter with rate in the 90s to low 100s.  Back provoked in the setting of inhalers/COPD exacerbation as above -Continue diltiazem infusion until next dose of p.o. Cardizem is due, I increased this to 60 mg every 6 hours -Change metoprolol to tartrate to 25 mg every 6 hours -Continue to treat causes of increased adrenergic tone including pain, hypoxia, anemia, electrolyte disturbances -Continue Eliquis 2.5 mg twice daily for stroke  prevention  # Acute on chronic HFpEF Clinically hypervolemic on exam with peripheral edema, s/p multiple days of IV Lasix with good diuresis. -Given additional dose of IV Lasix 40 mg this evening, then likely change back to p.o. dosing tomorrow  This patient's plan of care was discussed and created with Dr. Melton Alar and she is in agreement.  Signed: Rebeca Allegra , PA-C 04/04/2023, 3:46 PM [facility]

## 2023-04-04 NOTE — TOC Progression Note (Signed)
Transition of Care [facility]) - Progression Note    Patient Details  Name: Cheyenne Frank MRN: [id number] Date of Birth: [birth year]  [facility] CM/SW Contact  Truddie Hidden, RN Phone Number: 04/04/2023, 4:13 PM  Clinical Narrative:    TOC assessing for ongoing needs and discharge planning.        Expected Discharge Plan and Services                                               Social Determinants of Health (SDOH) Interventions SDOH Screenings   Food Insecurity: Patient Declined (04/01/2023)  Housing: Patient Declined (04/01/2023)  Transportation Needs: Patient Declined (04/01/2023)  Utilities: Patient Declined (04/01/2023)  Financial Resource Strain: Low Risk  (07/08/2017)   Received from [facility], [facility]  Physical Activity: Inactive (07/08/2017)   Received from [facility], [facility]  Social Connections: Unknown (07/08/2017)   Received from [facility], [facility]  Tobacco Use: Low Risk  (04/01/2023)    Readmission Risk Interventions    02/22/2023   10:37 AM 07/18/2022    3:27 PM 01/28/2022    2:28 PM  Readmission Risk Prevention Plan  Transportation Screening Complete Complete Complete  PCP or Specialist Appt within 3-5 Days   Complete  HRI or Home Care Consult   Complete  Social Work Consult for Recovery Care Planning/Counseling   Complete  Palliative Care Screening   Complete  Medication Review Oceanographer) Complete Complete Complete  PCP or Specialist appointment within 3-5 days of discharge Complete Complete   HRI or Home Care Consult  Complete   SW Recovery Care/Counseling Consult Complete Not Complete   SW Consult Not Complete Comments  NA   Palliative Care Screening Not Applicable Not Applicable   Skilled Nursing Facility Not Applicable Not Applicable

## 2023-04-05 DIAGNOSIS — J441 Chronic obstructive pulmonary disease with (acute) exacerbation: Secondary | ICD-10-CM | POA: Diagnosis not present

## 2023-04-05 LAB — MAGNESIUM: Magnesium: 2.7 mg/dL — ABNORMAL HIGH (ref 1.7–2.4)

## 2023-04-05 LAB — CBC
HCT: 42.1 % (ref 36.0–46.0)
Hemoglobin: 12.5 g/dL (ref 12.0–15.0)
MCH: 28.1 pg (ref 26.0–34.0)
MCHC: 29.7 g/dL — ABNORMAL LOW (ref 30.0–36.0)
MCV: 94.6 fL (ref 80.0–100.0)
Platelets: 138 10*3/uL — ABNORMAL LOW (ref 150–400)
RBC: 4.45 MIL/uL (ref 3.87–5.11)
RDW: 15.6 % — ABNORMAL HIGH (ref 11.5–15.5)
WBC: 9.7 10*3/uL (ref 4.0–10.5)
nRBC: 0 % (ref 0.0–0.2)

## 2023-04-05 LAB — PHOSPHORUS: Phosphorus: 4 mg/dL (ref 2.5–4.6)

## 2023-04-05 LAB — BASIC METABOLIC PANEL
Anion gap: 8 (ref 5–15)
BUN: 81 mg/dL — ABNORMAL HIGH (ref 8–23)
CO2: 37 mmol/L — ABNORMAL HIGH (ref 22–32)
Calcium: 8.8 mg/dL — ABNORMAL LOW (ref 8.9–10.3)
Chloride: 98 mmol/L (ref 98–111)
Creatinine, Ser: 1.63 mg/dL — ABNORMAL HIGH (ref 0.44–1.00)
GFR, Estimated: 31 mL/min — ABNORMAL LOW (ref >60)
Glucose, Bld: 175 mg/dL — ABNORMAL HIGH (ref 70–99)
Potassium: 4.9 mmol/L (ref 3.5–5.1)
Sodium: 143 mmol/L (ref 135–145)

## 2023-04-05 LAB — GLUCOSE, CAPILLARY
Glucose-Capillary: 123 mg/dL — ABNORMAL HIGH (ref 70–99)
Glucose-Capillary: 285 mg/dL — ABNORMAL HIGH (ref 70–99)
Glucose-Capillary: 265 mg/dL — ABNORMAL HIGH (ref 70–99)
Glucose-Capillary: 265 mg/dL — ABNORMAL HIGH (ref 70–99)

## 2023-04-05 MED ORDER — TORSEMIDE 20 MG PO TABS
20.0000 mg | ORAL_TABLET | Freq: Every day | ORAL | Status: DC
  Administered 2023-04-06 – 2023-04-08 (×3): 20 mg via ORAL
  Filled 2023-04-05 (×3): qty 1

## 2023-04-05 MED ORDER — METOPROLOL TARTRATE 50 MG PO TABS
50.0000 mg | ORAL_TABLET | Freq: Two times a day (BID) | ORAL | Status: DC
  Administered 2023-04-05 – 2023-04-06 (×2): 50 mg via ORAL
  Filled 2023-04-05 (×2): qty 1

## 2023-04-05 MED ORDER — DILTIAZEM HCL 30 MG PO TABS
90.0000 mg | ORAL_TABLET | Freq: Four times a day (QID) | ORAL | Status: DC
  Administered 2023-04-05 – 2023-04-08 (×12): 90 mg via ORAL
  Filled 2023-04-05 (×11): qty 3

## 2023-04-05 NOTE — Progress Notes (Signed)
[facility] Progress Note  Patient: Cheyenne Frank    LOV:[id number]  DOA: 04/01/2023     Date of Service: the patient was seen and examined on 04/05/2023  Chief Complaint  Patient presents with   Shortness of Breath    Pt. In via [facility] from home for SOB x 1 day, worse tonight, HX of COPD, EMS gave 2 duoneb and 125 solumedrol PTA   Brief hospital course: Cheyenne Frank is a 82 y.o. female with medical history significant of HTN, JHLD, DM,  COPD not on O2, dCHF, stroke, dementia, depression, CKD-4, morbid obesity, atrial fibrillation on Eliquis, subdural hematoma, RLS, fibromyalgia, who presents with shortness of breath.   Per her daughter at the bedside, patient has shortness of breath for more than 2 days, which has been progressively worsening.  He has dry cough, no chest pain, fever or chills.  Patient has wheezing.  Normally patient is not using oxygen at home, but was found to have oxygen desaturation to 87% on room air, which improved to 100% on 3 L oxygen.  Patient does not have nausea, vomiting, diarrhea or abdominal pain.  No symptoms of UTI.     Data reviewed independently and ED Course: pt was found to have negative COVID PCR, WBC 9.8, BNP 524, stable renal function, temperature normal, blood pressure 137/50, heart rate 127, 78, RR 22.  VBG with pH 7.32, CO2 57, O2 88.  Patient is admitted to telemetry bed as inpatient.   Chest x-ray: 1. Bilateral pleural effusions, small but that on the left appears increased since last month. Bilateral lung base atelectasis suspected. 2. Cardiomegaly with increased pulmonary vascularity. Cannot exclude mild or developing interstitial edema. 3. Question underlying Tracheomalacia.   EKG: Sinus rhythm, QTc 459, LAE, LAD, T wave inversion in the lateral leads and V5-V6.  Assessment and Plan: Principal Problem:   COPD exacerbation (HCC) Active Problems:   Acute on chronic diastolic CHF (congestive heart failure) (HCC)   Atrial  fibrillation with rapid ventricular response (HCC)   HTN (hypertension)   Stroke (HCC)   Stage 3b chronic kidney disease (CKD) (HCC)   HLD (hyperlipidemia)   Obesity, Class III, BMI 40-49.9 (morbid obesity) (HCC)   Type II diabetes mellitus with renal manifestations (HCC)     Assessment and Plan:   Atrial fibrillation with rapid ventricular response Continue Eliquis and Cardizem Continue as needed IV Lopressor 7/18 last night patient developed atrial flutter, Cardizem IV infusion was started. 7/19 increased Cardizem 90 mg p.o. every 6 hourly as per cardiology 7/19 Metoprolol 50 mg p.o. BID  S/p Cardizem IV infusion, gradually weaned off Cardiology consult appreciated  COPD exacerbation: Patient has cough, shortness of breath, wheezing, consistent with COPD exacerbation.  Patient has a 3 L new oxygen requirement.  No fever or leukocytosis clinically does not seem to have pneumonia. S/p azithromycin IV given in the ED, started azithromycin 250 mg p.o. daily x 4 days S/p IV Solu-Medrol, started tapering dose of oral prednisone Continue DuoNeb every 6 hourly scheduled, transition to as needed after improvement Started Breo Ellipta inhaler Mucinex 600 mg p.o. twice daily, Mucinex DM as needed for cough Continue supplemental O2 admission Continue incentive spirometry    Acute on chronic diastolic CHF (congestive heart failure) (HCC): 2D echo on 09/10/2022 showed EF of 70-75% with grade 2 diastolic dysfunction.  Patient has 1+ leg edema, elevated BNP 524, clinically consistent with CHF exacerbation. -s/p Lasix 40 mg IV bid, decrease Lasix 20 mg p.o. daily, transition  to torsemide 20 mh pod on 7/20 -Daily weights -strict I/O's -Low salt diet -Fluid restriction -As needed bronchodilators for shortness of breath   Hyperkalemia, Resolved  7/16 s/p Lokelma 10 g every 8 hourly x 2 doses but no BM 7/16 Lokelma 10 mg x 2 doses ordered Monitor potassium level daily Low potassium diet     HTN (hypertension) -IV hydralazine as needed -Cardizem and Lopressor Monitor BP and titrate medications accordingly  Stroke  -Crestor -Patient is on Eliquis due to A-fib   Stage 3b chronic kidney disease (CKD) (HCC): Close to recent baseline creatinine.  Recent baseline creatinine 2.47 on 02/23/2023.   creatinine 2.12--2.25 --2.35---1.85 -Follow-up  renal function by BMP   HLD (hyperlipidemia) -Crestor   Obesity, Class III, BMI 40-49.9 (morbid obesity) (HCC): Body weight 110 kg, BMI 44.35 -Encourage losing weight -Exercise and healthy diet   Diabetes mellitus with renal complications: Recent A1c 10.2, poorly controlled.  Patient taking NovoLog, Januvia, Lantus 64 units daily -Sliding scale insulin 7/17 increased Lantus 40 units BID  (64 u Home dose) Hyperglycemia secondary to steroids Follow diabetic coordinator consult Continue diabetic diet, monitor CBG  Iron deficiency anemia, transferrin saturation 8%, Venofer 200 mg IV x 5 doses followed by oral supplement Folic acid level within normal range  Body mass index is 45.46 kg/m.  Interventions:  Diet: Diabetic and heart healthy diet DVT Prophylaxis: Subcutaneous Lovenox   Advance goals of care discussion: DNR  Family Communication: family was present at bedside, at the time of interview.  The pt provided permission to discuss medical plan with the family. Opportunity was given to ask question and all questions were answered satisfactorily.   Disposition:  Pt is from Home, admitted with COPD and CHF exacerbation, developed A. Flutter, s/p dilt gtt, still tachy, which precludes a safe discharge. Discharge to home, when stable, may need few days more to stabilize.  Subjective: no sig events overnight, still tachy, no palpitation and no chest pain, sob is better now   Physical Exam: General: NAD, lying comfortably Appear in no distress, affect appropriate Eyes: PERRLA ENT: Oral Mucosa Clear, moist  Neck: no JVD,   Cardiovascular: Irregular rhythm, no Murmur,  Respiratory: Equal air entry bilaterally, mild bibasilar crackles and No wheezing. Abdomen: Bowel Sound present, Soft and no tenderness,  Skin: no rashes Extremities: Mild Pedal edema, no calf tenderness Neurologic: without any new focal findings Gait not checked due to patient safety concerns  Vitals:   04/05/23 0900 04/05/23 0904 04/05/23 1000 04/05/23 1136  BP:  (!) 147/74  136/87  Pulse: (!) 45 83 89 (!) 114  Resp: 19  16   Temp:    98 F (36.7 C)  TempSrc:    Oral  SpO2: 98%  99%   Weight:      Height:        Intake/Output Summary (Last 24 hours) at 04/05/2023 1409 Last data filed at 04/05/2023 0900 Gross per 24 hour  Intake 780 ml  Output 2050 ml  Net -1270 ml   Filed Weights   04/03/23 2248 04/04/23 0500 04/05/23 0629  Weight: 119.4 kg 118.8 kg 116.4 kg    Data Reviewed: I have personally reviewed and interpreted daily labs, tele strips, imagings as discussed above. I reviewed all nursing notes, pharmacy notes, vitals, pertinent old records I have discussed plan of care as described above with RN and patient/family.  CBC: Recent Labs  Lab 04/01/23 0347 04/02/23 0516 04/03/23 0529 04/04/23 0446 04/05/23 0851  WBC 9.8 9.9  10.5 10.6* 9.7  NEUTROABS 7.0  --   --   --   --   HGB 11.3* 10.8* 11.2* 11.3* 12.5  HCT 36.6 35.9* 36.9 36.8 42.1  MCV 93.4 94.0 93.7 90.6 94.6  PLT 163 139* 150 137* 138*   Basic Metabolic Panel: Recent Labs  Lab 04/01/23 0425 04/02/23 0516 04/03/23 0529 04/03/23 2109 04/04/23 0446 04/05/23 0851  NA 140 140 136  --  140 143  K 4.7 5.3* 5.2*  --  4.5 4.9  CL 105 100 96*  --  98 98  CO2 31 29 29   --  33* 37*  GLUCOSE 135* 302* 343*  --  157* 175*  BUN 56* 73* 85*  --  84* 81*  CREATININE 2.12* 2.25* 2.35*  --  1.85* 1.63*  CALCIUM 8.7* 8.6* 8.4*  --  8.7* 8.8*  MG  --   --  3.1* 2.8* 2.9* 2.7*  PHOS  --   --  5.9*  --  4.1 4.0    Studies: No results found.  Scheduled  Meds:  apixaban  2.5 mg Oral BID   bisacodyl  10 mg Rectal QHS   diltiazem  90 mg Oral Q6H   fluticasone furoate-vilanterol  1 puff Inhalation Daily   gabapentin  300 mg Oral BID   guaiFENesin  600 mg Oral BID   insulin aspart  0-5 Units Subcutaneous QHS   insulin aspart  0-9 Units Subcutaneous TID WC   insulin glargine-yfgn  40 Units Subcutaneous BID   [START ON 04/07/2023] iron polysaccharides  150 mg Oral Daily   metoprolol tartrate  50 mg Oral BID   multivitamin with minerals  1 tablet Oral Daily   oxybutynin  10 mg Oral QHS   pantoprazole  40 mg Oral Daily   PARoxetine  40 mg Oral QHS   [START ON 04/06/2023] predniSONE  20 mg Oral Q breakfast   Followed by   Melene Muller ON 04/09/2023] predniSONE  10 mg Oral Q breakfast   rosuvastatin  2.5 mg Oral QHS   [START ON 04/06/2023] torsemide  20 mg Oral Daily   traZODone  75 mg Oral QHS   Continuous Infusions:  iron sucrose 200 mg (04/05/23 1300)   PRN Meds: acetaminophen, albuterol, bisacodyl, bisacodyl, dextromethorphan-guaiFENesin, hydrALAZINE, metoprolol tartrate, ondansetron (ZOFRAN) IV  Time spent: 55 minutes  Author: Gillis Santa. MD [facility] 04/05/2023 2:09 PM  To reach On-call, see care teams to locate the attending and reach out to them via [email or web address]. If 7PM-7AM, please contact night-coverage If you still have difficulty reaching the attending provider, please page the [facility] (Director on Call) for [facility] on amion for assistance.

## 2023-04-05 NOTE — Inpatient Diabetes Management (Signed)
Inpatient Diabetes Program Recommendations  AACE/ADA: New Consensus Statement on Inpatient Glycemic Control (2015)  Target Ranges:  Prepandial:   less than 140 mg/dL      Peak postprandial:   less than 180 mg/dL (1-2 hours)      Critically ill patients:  140 - 180 mg/dL   Lab Results  Component Value Date   GLUCAP 285 (H) 04/05/2023   HGBA1C 10.2 (H) 02/20/2023    Review of Glycemic Control  Latest Reference Range & Units 04/05/23 08:05 04/05/23 11:45  Glucose-Capillary 70 - 99 mg/dL 952 (H) 841 (H)  (H): Data is abnormally high  Diabetes history: DM2 Outpatient Diabetes medications:  Lantus 64 units every day Humalog 22 units TID Januvia 50 mg QD Current orders for Inpatient glycemic control:  Semglee 40 units BID Novolog 0-9 units TID and 0-5 units at bedtime Prednisone tapering  Inpatient Diabetes Program Recommendations:    Please consider:  Novolog 3 units TID with meals if she consumes at least 50%  Will continue to follow while inpatient.  Thank you, Dulce Sellar, MSN, CDCES Diabetes Coordinator Inpatient Diabetes Program [phone] (team pager from 8a-5p)

## 2023-04-05 NOTE — Progress Notes (Signed)
PT Cancellation Note  Patient Details Name: Cheyenne Frank MRN: [id number] DOB: Sep 17, 1941   Cancelled Treatment:     Pt recently transferred from 1C to 2A. New orders requested and received to Eval and treat on 7/20, therefore session held for today. Pt also very groggy and lethargic.   Jannet Askew 04/05/2023, 4:33 PM

## 2023-04-05 NOTE — Progress Notes (Signed)
[facility] CARDIOLOGY CONSULT NOTE       Patient ID: KRISANDRA BUENO MRN: [id number] DOB/AGE: 1940/11/15 82 y.o.  Admit date: 04/01/2023 Referring Physician Dr. Gillis Santa  Primary Physician Dr. Laurine Blazer Primary Cardiologist Dr. Darrold Junker  Reason for Consultation AFL RVR   HPI: [place] B. Carbonell is an 35yoF with a PMH of paroxymal atrial flutter (eliquis), DM2, HTN, CKD, hx CVA, morbid obesity (BMI 45 kg/m), memory impairment who presented to [facility] ED 04/01/2023 with shortness of breath for with associated dry cough and wheezing.  Hypoxic on admission requiring 3L by [place].  She is being treated for COPD exacerbation and volume overload.  Cardiology is consulted the afternoon of hospital day 3 for assistance with atrial flutter with RVR.  Interval History:  - seen and examined with daughter and caretaker at bedside - patient feels well, ate a good breakfast - no chest pain, shortness of breath, palpitaitons. peripheral edema much improved - in AF on tele, rate 100-110s while awake. Better controlled overnight   Review of systems complete and found to be negative unless listed above     Past Medical History:  Diagnosis Date   AKI (acute kidney injury) (HCC)    Anemia of chronic disease    Anxiety    Arthritis    Asthma    no problems since 1999 (after moving into a new home)   Chronic airway obstruction (HCC)    Complication of anesthesia    " feel like I'm dying after I wake up"   Diabetes mellitus without complication (HCC)    Diverticulosis    Dysrhythmia    unknown type    Fibromyalgia    Flat back syndrome    GERD (gastroesophageal reflux disease)    Hx of transfusion 2011   Hypertension    Itching    Macular degeneration    Nocturia    Postoperative anemia due to acute blood loss 04/09/2013   Restless leg syndrome    Sleep apnea    Dx 1990's unable to wear c-pap -   Stroke (HCC) 2010   verbal aphasia x 30 min - resolved - no problem since then    Past  Surgical History:  Procedure Laterality Date   ABDOMINAL HYSTERECTOMY     rt so   BACK SURGERY  2010 / 2011   BREAST REDUCTION SURGERY     BURR HOLE FOR SUBDURAL HEMATOMA  2015   cataracts removed     CHOLECYSTECTOMY  1990   DILATION AND CURETTAGE OF UTERUS     HERNIA REPAIR     POSTERIOR LAMINECTOMY / DECOMPRESSION LUMBAR SPINE     with T10-S1 fusion   TONSILLECTOMY     TOTAL KNEE ARTHROPLASTY Left 04/06/2013   Procedure: LEFT TOTAL KNEE ARTHROPLASTY;  Surgeon: Loanne Drilling, MD;  Location: [facility] ORS;  Service: Orthopedics;  Laterality: Left;    Medications Prior to Admission  Medication Sig Dispense Refill Last Dose   acetaminophen (TYLENOL) 500 MG tablet Take 500-1,000 mg by mouth every 6 (six) hours as needed for mild pain or fever.    prn at unk   albuterol (VENTOLIN HFA) 108 (90 Base) MCG/ACT inhaler Inhale 2 puffs into the lungs every 6 (six) hours as needed for wheezing or shortness of breath.   04/01/2023 at 0200   apixaban (ELIQUIS) 2.5 MG TABS tablet Take 1 tablet (2.5 mg total) by mouth 2 (two) times daily. 60 tablet 0 03/31/2023 at pm   diltiazem (CARDIZEM CD)  180 MG 24 hr capsule Take 1 capsule (180 mg total) by mouth daily. 1 capsule 0 03/31/2023   gabapentin (NEURONTIN) 300 MG capsule Take 1 capsule (300 mg total) by mouth 2 (two) times daily. 6 capsule 0 03/31/2023   guaiFENesin (ROBITUSSIN) 100 MG/5ML liquid Take 5 mLs by mouth every 4 (four) hours as needed for cough or to loosen phlegm. 120 mL 0 03/31/2023   insulin glargine (LANTUS) 100 UNIT/ML injection Inject 0.64 mLs (64 Units total) into the skin at bedtime.   03/31/2023   insulin lispro (HUMALOG KWIKPEN) 100 UNIT/ML KwikPen Inject 22 Units into the skin 3 (three) times daily.   03/31/2023   Multiple Vitamin (MULTIVITAMIN WITH MINERALS) TABS tablet Take 1 tablet by mouth daily.   03/31/2023   Multiple Vitamins-Minerals (ICAPS AREDS 2 PO) Take 1 capsule by mouth in the morning and at bedtime.   03/31/2023   omeprazole  (PRILOSEC) 40 MG capsule Take 40 mg by mouth daily.    03/31/2023   oxybutynin (DITROPAN-XL) 10 MG 24 hr tablet Take 10 mg by mouth at bedtime.   03/31/2023   PARoxetine (PAXIL) 40 MG tablet Take 1 tablet (40 mg total) by mouth at bedtime. 6 tablet 0 03/31/2023   rosuvastatin (CRESTOR) 5 MG tablet Take 2.5 mg by mouth at bedtime.   03/31/2023   sitaGLIPtin (JANUVIA) 50 MG tablet Take 50 mg by mouth daily.   03/31/2023   torsemide (DEMADEX) 20 MG tablet Take 1 tablet (20 mg total) by mouth daily. Please start from 11/01/2021 90 tablet 1 03/31/2023   traZODone (DESYREL) 50 MG tablet Take 1.5 tablets (75 mg total) by mouth at bedtime.   03/31/2023   insulin lispro (HUMALOG) 100 UNIT/ML injection Inject 0.05 mLs (5 Units total) into the skin 3 (three) times daily with meals. 10 mL 11    metoprolol tartrate (LOPRESSOR) 25 MG tablet Take 1 tablet (25 mg total) by mouth 2 (two) times daily. 60 tablet 0    Social History   Socioeconomic History   Marital status: Single    Spouse name: Not on file   Number of children: Not on file   Years of education: Not on file   Highest education level: Not on file  Occupational History   Not on file  Tobacco Use   Smoking status: Never   Smokeless tobacco: Never  Substance and Sexual Activity   Alcohol use: No    Alcohol/week: 0.0 standard drinks of alcohol   Drug use: No   Sexual activity: Not Currently  Other Topics Concern   Not on file  Social History Narrative   Not on file   Social Determinants of Health   Financial Resource Strain: Low Risk  (07/08/2017)   Received from [facility], [facility]   Overall Financial Resource Strain (CARDIA)    Difficulty of Paying Living Expenses: Not hard at all  Food Insecurity: Patient Declined (04/01/2023)   Hunger Vital Sign    Worried About Running Out of Food in the Last Year: Patient declined    Ran Out of Food in the Last Year: Patient declined  Transportation Needs:  Patient Declined (04/01/2023)   PRAPARE - Administrator, Civil Service (Medical): Patient declined    Lack of Transportation (Non-Medical): Patient declined  Physical Activity: Inactive (07/08/2017)   Received from [facility], [facility]   Exercise Vital Sign    Days of Exercise per Week: 0 days  Minutes of Exercise per Session: 0 min  Stress: Not on file  Social Connections: Unknown (07/08/2017)   Received from [facility], [facility]   Social Connection and Isolation Panel [NHANES]    Frequency of Communication with Friends and Family: More than three times a week    Frequency of Social Gatherings with Friends and Family: More than three times a week    Attends Religious Services: Not on file    Active Member of Clubs or Organizations: No    Attends Banker Meetings: Never    Marital Status: Widowed  Intimate Partner Violence: Patient Declined (04/01/2023)   Humiliation, Afraid, Rape, and Kick questionnaire    Fear of Current or Ex-Partner: Patient declined    Emotionally Abused: Patient declined    Physically Abused: Patient declined    Sexually Abused: Patient declined    Family History  Problem Relation Age of Onset   COPD Sister        had lung transplant   COPD Brother       Intake/Output Summary (Last 24 hours) at 04/05/2023 1023 Last data filed at 04/05/2023 0900 Gross per 24 hour  Intake 1020 ml  Output 2050 ml  Net -1030 ml    Vitals:   04/05/23 0800 04/05/23 0802 04/05/23 0900 04/05/23 0904  BP: (!) 128/58   (!) 147/74  Pulse: (!) 46  (!) 45 83  Resp: 15  19   Temp:  98 F (36.7 C)    TempSrc:  Oral    SpO2: 100%  98%   Weight:      Height:        PHYSICAL EXAM General: Pleasant elderly Caucasian female, well nourished, in no acute distress.  Sitting upright in bed with daughter and caretaker present HEENT:  Normocephalic and atraumatic. Neck:  No  JVD.  Lungs: Normal respiratory effort on 3L by [place].  Decreased breath sounds bilaterally without appreciable wheezing  heart: tachy irregular irregular.. Normal S1 and S2 without gallops or murmurs.  Abdomen: Non-distended appearing with excess adiposity.  Msk: Normal strength and tone for age. Extremities: Warm and well perfused. No clubbing, cyanosis.  Trace bilateral lower extremity edema.  Chronic appearing brawny hyperpigmentation of both lower legs Neuro: Alert and oriented X 3. Psych:  Answers questions appropriately.   Labs: Basic Metabolic Panel: Recent Labs    04/03/23 0529 04/03/23 2109 04/04/23 0446  NA 136  --  140  K 5.2*  --  4.5  CL 96*  --  98  CO2 29  --  33*  GLUCOSE 343*  --  157*  BUN 85*  --  84*  CREATININE 2.35*  --  1.85*  CALCIUM 8.4*  --  8.7*  MG 3.1* 2.8* 2.9*  PHOS 5.9*  --  4.1   Liver Function Tests: No results for input(s): "AST", "ALT", "ALKPHOS", "BILITOT", "PROT", "ALBUMIN" in the last 72 hours. No results for input(s): "LIPASE", "AMYLASE" in the last 72 hours. CBC: Recent Labs    04/04/23 0446 04/05/23 0851  WBC 10.6* 9.7  HGB 11.3* 12.5  HCT 36.8 42.1  MCV 90.6 94.6  PLT 137* 138*   Cardiac Enzymes: Recent Labs    04/03/23 2109 04/03/23 2207  TROPONINIHS 15 15   BNP: No results for input(s): "BNP" in the last 72 hours. D-Dimer: No results for input(s): "DDIMER" in the last 72 hours. Hemoglobin A1C: No results for input(s): "HGBA1C" in the last 72 hours. Fasting Lipid  Panel: No results for input(s): "CHOL", "HDL", "LDLCALC", "TRIG", "CHOLHDL", "LDLDIRECT" in the last 72 hours. Thyroid Function Tests: No results for input(s): "TSH", "T4TOTAL", "T3FREE", "THYROIDAB" in the last 72 hours.  Invalid input(s): "FREET3" Anemia Panel: No results for input(s): "VITAMINB12", "FOLATE", "FERRITIN", "TIBC", "IRON", "RETICCTPCT" in the last 72 hours.    Radiology: ECHOCARDIOGRAM COMPLETE  Result Date: 04/03/2023     ECHOCARDIOGRAM REPORT   Patient Name:   KEYSI OELKERS Date of Exam: 04/03/2023 Medical Rec #:  [id number]       Height:       62.0 in Accession #:    [id number]      Weight:       377.9 lb Date of Birth:  Jan 29, 1941        BSA:          2.505 m Patient Age:    82 years        BP:           121/83 mmHg Patient Gender: F               HR:           132 bpm. Exam Location:  [facility] Procedure: 2D Echo, Cardiac Doppler and Color Doppler Indications:     CHF  History:         Patient has prior history of Echocardiogram examinations, most                  recent 09/10/2022. CHF, Stroke, TIA and COPD, Arrythmias:Atrial                  Fibrillation and Atrial Flutter, Signs/Symptoms:Chest Pain;                  Risk Factors:Hypertension, Diabetes, Dyslipidemia and Sleep                  Apnea. CKD.  Sonographer:     Mikki Harbor Referring Phys:  [id number] Gillis Santa Diagnosing Phys: Debbe Odea MD  Sonographer Comments: Technically difficult study due to poor echo windows and patient is obese. IMPRESSIONS  1. Left ventricular ejection fraction, by estimation, is 60 to 65%. The left ventricle has normal function. The left ventricle has no regional wall motion abnormalities. There is mild left ventricular hypertrophy. Indeterminate diastolic filling due to E-A fusion.  2. Right ventricular systolic function is normal. The right ventricular size is normal. There is normal pulmonary artery systolic pressure.  3. The mitral valve is degenerative. Mild mitral valve regurgitation.  4. The aortic valve is grossly normal. Aortic valve regurgitation is not visualized.  5. The inferior vena cava is normal in size with greater than 50% respiratory variability, suggesting right atrial pressure of 3 mmHg. FINDINGS  Left Ventricle: Left ventricular ejection fraction, by estimation, is 60 to 65%. The left ventricle has normal function. The left ventricle has no regional wall motion abnormalities. The left ventricular internal cavity  size was normal in size. There is  mild left ventricular hypertrophy. Indeterminate diastolic filling due to E-A fusion. Right Ventricle: The right ventricular size is normal. No increase in right ventricular wall thickness. Right ventricular systolic function is normal. There is normal pulmonary artery systolic pressure. The tricuspid regurgitant velocity is 2.55 m/s, and  with an assumed right atrial pressure of 8 mmHg, the estimated right ventricular systolic pressure is 34.0 mmHg. Left Atrium: Left atrial size was normal in size. Right Atrium: Right atrial size was normal in size.  Pericardium: There is no evidence of pericardial effusion. Mitral Valve: The mitral valve is degenerative in appearance. Mild to moderate mitral annular calcification. Mild mitral valve regurgitation. MV peak gradient, 10.2 mmHg. The mean mitral valve gradient is 4.0 mmHg. Tricuspid Valve: The tricuspid valve is normal in structure. Tricuspid valve regurgitation is not demonstrated. Aortic Valve: The aortic valve is grossly normal. Aortic valve regurgitation is not visualized. Aortic valve mean gradient measures 3.0 mmHg. Aortic valve peak gradient measures 6.1 mmHg. Aortic valve area, by VTI measures 2.31 cm. Pulmonic Valve: The pulmonic valve was not well visualized. Pulmonic valve regurgitation is not visualized. Aorta: The aortic root is normal in size and structure. Venous: The inferior vena cava is normal in size with greater than 50% respiratory variability, suggesting right atrial pressure of 3 mmHg. IAS/Shunts: No atrial level shunt detected by color flow Doppler.  LEFT VENTRICLE PLAX 2D LVIDd:         4.20 cm LVIDs:         2.30 cm LV PW:         1.60 cm LV IVS:        1.50 cm LVOT diam:     2.00 cm LV SV:         47 LV SV Index:   19 LVOT Area:     3.14 cm  RIGHT VENTRICLE RV Basal diam:  3.40 cm RV Mid diam:    3.70 cm LEFT ATRIUM             Index        RIGHT ATRIUM           Index LA diam:        4.10 cm 1.64 cm/m    RA Area:     19.00 cm LA Vol (A2C):   53.7 ml 21.44 ml/m  RA Volume:   52.50 ml  20.96 ml/m LA Vol (A4C):   80.5 ml 32.13 ml/m LA Biplane Vol: 66.7 ml 26.62 ml/m  AORTIC VALVE                    PULMONIC VALVE AV Area (Vmax):    2.41 cm     PV Vmax:       1.11 m/s AV Area (Vmean):   2.55 cm     PV Peak grad:  4.9 mmHg AV Area (VTI):     2.31 cm AV Vmax:           123.00 cm/s AV Vmean:          73.650 cm/s AV VTI:            0.206 m AV Peak Grad:      6.1 mmHg AV Mean Grad:      3.0 mmHg LVOT Vmax:         94.20 cm/s LVOT Vmean:        59.700 cm/s LVOT VTI:          0.151 m LVOT/AV VTI ratio: 0.73  AORTA Ao Root diam: 3.70 cm MITRAL VALVE                TRICUSPID VALVE MV Area (PHT): 5.34 cm     TR Peak grad:   26.0 mmHg MV Area VTI:   2.31 cm     TR Vmax:        255.00 cm/s MV Peak grad:  10.2 mmHg MV Mean grad:  4.0 mmHg     SHUNTS MV Vmax:  1.60 m/s     Systemic VTI:  0.15 m MV Vmean:      89.4 cm/s    Systemic Diam: 2.00 cm MV Decel Time: 142 msec MV E velocity: 143.00 cm/s Debbe Odea MD Electronically signed by Debbe Odea MD Signature Date/Time: 04/03/2023/5:14:58 PM    Final    US RENAL  Result Date: 04/02/2023 CLINICAL DATA:  Acute kidney injury EXAM: RENAL / URINARY TRACT ULTRASOUND COMPLETE COMPARISON:  CT 07/11/2020 FINDINGS: Right Kidney: Renal measurements: 10.1 x 5.2 x 5.3 cm = volume: 145.9 mL. Echogenicity within normal limits. No hydronephrosis. No suspicious mass Left Kidney: Renal measurements: 11.4 x 6 x 4 cm = volume: 216.2 mL. Echogenicity within normal limits. No suspicious mass or hydronephrosis. Bladder: Appears normal for degree of bladder distention. Other: Incidental pleural effusions. IMPRESSION: Negative for hydronephrosis Electronically Signed   By: Jasmine Pang M.D.   On: 04/02/2023 15:41   DG Chest Portable 1 View  Result Date: 04/01/2023 CLINICAL DATA:  82 year old female with shortness of breath. Undergoing breathing treatment. EXAM: PORTABLE  CHEST 1 VIEW COMPARISON:  Portable chest 02/23/2023. Chest CTA 02/19/2023 and earlier. FINDINGS: Portable AP upright view at 0356 hours. Left lung veiling opacity has mildly increased since last month. Bilateral layering pleural effusions demonstrated on CTA at that time and pleural effusion on the right also likely persists, with patchy associated opacity most resembling atelectasis. Stable mild cardiomegaly and other mediastinal contours. Mildly irregular tracheal air column appears stable from previous exams and was atelectatic on the CTA last month. No superimposed pneumothorax. Pulmonary vascularity does appear mildly increased. No air bronchograms. Paucity of bowel gas. Chronic lower spinal fusion hardware. IMPRESSION: 1. Bilateral pleural effusions, small but that on the left appears increased since last month. Bilateral lung base atelectasis suspected. 2. Cardiomegaly with increased pulmonary vascularity. Cannot exclude mild or developing interstitial edema. 3. Question underlying Tracheomalacia. Electronically Signed   By: Odessa Fleming M.D.   On: 04/01/2023 04:07     TELEMETRY reviewed by me (LT) 04/05/2023 : Atrial flutter  100s to 110s  EKG reviewed by me: 7/15, NSR rate 86 with LVH  Data reviewed by me (LT) 04/05/2023: Hospitalist progress note, nursing notes last 24h vitals tele labs imaging I/O   Principal Problem:   COPD exacerbation (HCC) Active Problems:   HTN (hypertension)   Obesity, Class III, BMI 40-49.9 (morbid obesity) (HCC)   Stage 3b chronic kidney disease (CKD) (HCC)   Acute on chronic diastolic CHF (congestive heart failure) (HCC)   Stroke (HCC)   Atrial fibrillation with rapid ventricular response (HCC)   HLD (hyperlipidemia)   Type II diabetes mellitus with renal manifestations (HCC)    ASSESSMENT AND PLAN:  Nayleen B. Brunkhorst is an 70yoF with a PMH of paroxymal atrial flutter (eliquis), DM2, HTN, CKD, hx CVA, morbid obesity (BMI 45 kg/m), memory impairment who presented  to [facility] ED 04/01/2023 with shortness of breath for with associated dry cough and wheezing.  Hypoxic on admission requiring 3L by [place].  She is being treated for COPD exacerbation and volume overload.  Cardiology is consulted the afternoon of hospital day 3 for assistance with atrial flutter with RVR.  # COPD exacerbation # Acute hypoxic respiratory failure On steroids, DuoNebs, and Breo inhalers per primary  # Paroxysmal atrial flutter Rate controlled overnight, while wake in the daytime rates up to 110s.  suspect provoked in the setting of inhalers/COPD exacerbation as above -s/p diltiazem infusion  -increase PO diltiazem form 60 mg every  6 hours to 90 q6h -consolidate metoprolol tartrate to 50mg  bid  -Continue to treat causes of increased adrenergic tone including pain, hypoxia, anemia, electrolyte disturbances -Continue Eliquis 2.5 mg twice daily for stroke prevention  # Acute on chronic HFpEF Clinically hypervolemic on exam with peripheral edema, s/p multiple days of IV Lasix with good diuresis. - change IV lasix back to PO torsemide 20mg  daily tomorrow.   This patient's plan of care was discussed and created with Dr. Juliann Pares and he is in agreement.  Signed: Rebeca Allegra , PA-C 04/05/2023, 10:23 AM [facility] Cardiology

## 2023-04-05 NOTE — Progress Notes (Signed)
OT Cancellation Note  Patient Details Name: Cheyenne Frank MRN: [id number] DOB: 07-Feb-1941   Cancelled Treatment:    Reason Eval/Treat Not Completed: Other (comment). Chart reviewed. Pt noted with transfer to higher level of care. Per therapy guidelines, will require new orders to continue therapy once medically appropriate. MD notified via secure chat.   Arman Filter., MPH, MS, OTR/L ascom [phone] 04/05/23, 4:18 PM

## 2023-04-06 DIAGNOSIS — J441 Chronic obstructive pulmonary disease with (acute) exacerbation: Secondary | ICD-10-CM | POA: Diagnosis not present

## 2023-04-06 LAB — CBC
HCT: 37.3 % (ref 36.0–46.0)
Hemoglobin: 11.6 g/dL — ABNORMAL LOW (ref 12.0–15.0)
MCH: 28.9 pg (ref 26.0–34.0)
MCHC: 31.1 g/dL (ref 30.0–36.0)
MCV: 92.8 fL (ref 80.0–100.0)
Platelets: 121 10*3/uL — ABNORMAL LOW (ref 150–400)
RBC: 4.02 MIL/uL (ref 3.87–5.11)
RDW: 15.2 % (ref 11.5–15.5)
WBC: 8.4 10*3/uL (ref 4.0–10.5)
nRBC: 0 % (ref 0.0–0.2)

## 2023-04-06 LAB — BASIC METABOLIC PANEL
Anion gap: 7 (ref 5–15)
BUN: 78 mg/dL — ABNORMAL HIGH (ref 8–23)
CO2: 34 mmol/L — ABNORMAL HIGH (ref 22–32)
Calcium: 8.6 mg/dL — ABNORMAL LOW (ref 8.9–10.3)
Chloride: 99 mmol/L (ref 98–111)
Creatinine, Ser: 1.68 mg/dL — ABNORMAL HIGH (ref 0.44–1.00)
GFR, Estimated: 30 mL/min — ABNORMAL LOW (ref >60)
Glucose, Bld: 291 mg/dL — ABNORMAL HIGH (ref 70–99)
Potassium: 4.7 mmol/L (ref 3.5–5.1)
Sodium: 140 mmol/L (ref 135–145)

## 2023-04-06 LAB — GLUCOSE, CAPILLARY
Glucose-Capillary: 188 mg/dL — ABNORMAL HIGH (ref 70–99)
Glucose-Capillary: 295 mg/dL — ABNORMAL HIGH (ref 70–99)
Glucose-Capillary: 343 mg/dL — ABNORMAL HIGH (ref 70–99)
Glucose-Capillary: 256 mg/dL — ABNORMAL HIGH (ref 70–99)

## 2023-04-06 LAB — MAGNESIUM: Magnesium: 2.6 mg/dL — ABNORMAL HIGH (ref 1.7–2.4)

## 2023-04-06 LAB — PHOSPHORUS: Phosphorus: 3.5 mg/dL (ref 2.5–4.6)

## 2023-04-06 MED ORDER — SODIUM CHLORIDE 0.9 % IV SOLN: INTRAVENOUS | Status: DC | PRN

## 2023-04-06 MED ORDER — SOTALOL HCL 80 MG PO TABS
80.0000 mg | ORAL_TABLET | Freq: Two times a day (BID) | ORAL | Status: DC
  Administered 2023-04-06 – 2023-04-08 (×4): 80 mg via ORAL
  Filled 2023-04-06 (×4): qty 1

## 2023-04-06 MED ORDER — DILTIAZEM HCL-DEXTROSE 125-5 MG/125ML-% IV SOLN (PREMIX)
5.0000 mg/h | INTRAVENOUS | Status: DC
  Administered 2023-04-06: 5 mg/h via INTRAVENOUS
  Filled 2023-04-06: qty 125

## 2023-04-06 MED ORDER — METOPROLOL TARTRATE 50 MG PO TABS
50.0000 mg | ORAL_TABLET | Freq: Three times a day (TID) | ORAL | Status: DC
  Administered 2023-04-06 – 2023-04-07 (×5): 50 mg via ORAL
  Filled 2023-04-06 (×5): qty 1

## 2023-04-06 NOTE — Progress Notes (Signed)
Sustained HR at 135, not time yet for PRN metoprolol.  Dr. Dannial Monarch notified, see new orders.    04/06/23 1518  Assess: MEWS Score  BP (!) 151/104  MAP (mmHg) 118  Pulse Rate (!) 134  ECG Heart Rate (!) 133  Resp 17  SpO2 92 %  O2 Device Nasal Cannula  O2 Flow Rate (L/min) 3 L/min  Assess: MEWS Score  MEWS Temp 0  MEWS Systolic 0  MEWS Pulse 3  MEWS RR 0  MEWS LOC 0  MEWS Score 3  MEWS Score Color Yellow  Assess: if the MEWS score is Yellow or Red  Were vital signs taken at a resting state? Yes  Focused Assessment No change from prior assessment  Does the patient meet 2 or more of the SIRS criteria? No  MEWS guidelines implemented  No, previously yellow, continue vital signs every 4 hours  Provider Notification  Provider Name/Title Dr. Gillis Santa / Attending  Date Provider Notified 04/06/23  Time Provider Notified 1521  Method of Notification  (Secure Chat)  Notification Reason Other (Comment) (increased, sustained heart rate)  Provider response Evaluate remotely;See new orders  Date of Provider Response 04/06/23  Time of Provider Response 1522  Assess: SIRS CRITERIA  SIRS Temperature  0  SIRS Pulse 1  SIRS Respirations  0  SIRS WBC 0  SIRS Score Sum  1

## 2023-04-06 NOTE — Progress Notes (Signed)
[facility] Progress Note  Patient: Cheyenne Frank    BMW:[id number]  DOA: 04/01/2023     Date of Service: the patient was seen and examined on 04/06/2023  Chief Complaint  Patient presents with   Shortness of Breath    Pt. In via [facility] from home for SOB x 1 day, worse tonight, HX of COPD, EMS gave 2 duoneb and 125 solumedrol PTA   Brief hospital course: SAFIYAH CISNEY is a 82 y.o. female with medical history significant of HTN, JHLD, DM,  COPD not on O2, dCHF, stroke, dementia, depression, CKD-4, morbid obesity, atrial fibrillation on Eliquis, subdural hematoma, RLS, fibromyalgia, who presents with shortness of breath.   Per her daughter at the bedside, patient has shortness of breath for more than 2 days, which has been progressively worsening.  He has dry cough, no chest pain, fever or chills.  Patient has wheezing.  Normally patient is not using oxygen at home, but was found to have oxygen desaturation to 87% on room air, which improved to 100% on 3 L oxygen.  Patient does not have nausea, vomiting, diarrhea or abdominal pain.  No symptoms of UTI.     Data reviewed independently and ED Course: pt was found to have negative COVID PCR, WBC 9.8, BNP 524, stable renal function, temperature normal, blood pressure 137/50, heart rate 127, 78, RR 22.  VBG with pH 7.32, CO2 57, O2 88.  Patient is admitted to telemetry bed as inpatient.   Chest x-ray: 1. Bilateral pleural effusions, small but that on the left appears increased since last month. Bilateral lung base atelectasis suspected. 2. Cardiomegaly with increased pulmonary vascularity. Cannot exclude mild or developing interstitial edema. 3. Question underlying Tracheomalacia.   EKG: Sinus rhythm, QTc 459, LAE, LAD, T wave inversion in the lateral leads and V5-V6.  Assessment and Plan: Principal Problem:   COPD exacerbation (HCC) Active Problems:   Acute on chronic diastolic CHF (congestive heart failure) (HCC)   Atrial  fibrillation with rapid ventricular response (HCC)   HTN (hypertension)   Stroke (HCC)   Stage 3b chronic kidney disease (CKD) (HCC)   HLD (hyperlipidemia)   Obesity, Class III, BMI 40-49.9 (morbid obesity) (HCC)   Type II diabetes mellitus with renal manifestations (HCC)     # Atrial fibrillation with rapid ventricular response Continue Eliquis and Cardizem Continue as needed IV Lopressor 7/18 last night patient developed atrial flutter, Cardizem IV infusion was started. 7/19 increased Cardizem 90 mg p.o. every 6 hourly as per cardiology 7/19 Metoprolol 50 mg p.o. BID  S/p Cardizem IV infusion, gradually weaned off Cardiology consult appreciated  # COPD exacerbation: Patient has cough, shortness of breath, wheezing, consistent with COPD exacerbation.  Patient has a 3 L new oxygen requirement.  No fever or leukocytosis clinically does not seem to have pneumonia. S/p azithromycin IV given in the ED, started azithromycin 250 mg p.o. daily x 4 days S/p IV Solu-Medrol, started tapering dose of oral prednisone Continue DuoNeb every 6 hourly scheduled, transition to as needed after improvement Started Breo Ellipta inhaler Mucinex 600 mg p.o. twice daily, Mucinex DM as needed for cough Continue supplemental O2 admission Continue incentive spirometry    # Acute on chronic diastolic CHF (congestive heart failure) (HCC): 2D echo on 09/10/2022 showed EF of 70-75% with grade 2 diastolic dysfunction.  Patient has 1+ leg edema, elevated BNP 524, clinically consistent with CHF exacerbation. -s/p Lasix 40 mg IV bid, decrease Lasix 20 mg p.o. daily, transition to torsemide  20 mh pod on 7/20 -Daily weights -strict I/O's -Low salt diet -Fluid restriction -As needed bronchodilators for shortness of breath   Hyperkalemia, Resolved  7/16 s/p Lokelma 10 g every 8 hourly x 2 doses but no BM 7/16 Lokelma 10 mg x 2 doses ordered Monitor potassium level daily Low potassium diet    HTN  (hypertension) -IV hydralazine as needed -Cardizem and Lopressor Monitor BP and titrate medications accordingly  Stroke  -Crestor -Patient is on Eliquis due to A-fib   Stage 3b chronic kidney disease (CKD) (HCC): Close to recent baseline creatinine.  Recent baseline creatinine 2.47 on 02/23/2023.   creatinine 2.12--2.25 --2.35---1.85--1.68 -Follow-up  renal function by BMP   HLD (hyperlipidemia) -Crestor   Obesity, Class III, BMI 40-49.9 (morbid obesity) (HCC): Body weight 110 kg, BMI 44.35 -Encourage losing weight -Exercise and healthy diet   Diabetes mellitus with renal complications: Recent A1c 10.2, poorly controlled.  Patient taking NovoLog, Januvia, Lantus 64 units daily -Sliding scale insulin 7/17 increased Lantus 40 units BID  (64 u Home dose) Hyperglycemia secondary to steroids Follow diabetic coordinator consult Continue diabetic diet, monitor CBG  Iron deficiency anemia, transferrin saturation 8%, Venofer 200 mg IV x 5 doses followed by oral supplement Folic acid level within normal range  Body mass index is 45.14 kg/m.  Interventions:  Diet: Diabetic and heart healthy diet DVT Prophylaxis: Subcutaneous Lovenox   Advance goals of care discussion: DNR  Family Communication: family was present at bedside, at the time of interview.  The pt provided permission to discuss medical plan with the family. Opportunity was given to ask question and all questions were answered satisfactorily.   Disposition:  Pt is from Home, admitted with COPD and CHF exacerbation, developed A. Flutter, s/p dilt gtt, still tachy, which precludes a safe discharge. Discharge to home, when stable, may need few days more to stabilize.  Subjective: No significant events overnight, patient's breathing is getting better, still requiring supplemental O2 in addition on 3 L right now.  Denies any chest pain or palpitation, no any other active issues. Patient is still tachycardic on the monitor and  irregular rhythm   Physical Exam: General: NAD, lying comfortably Appear in no distress, affect appropriate Eyes: PERRLA ENT: Oral Mucosa Clear, moist  Neck: no JVD,  Cardiovascular: Irregular rhythm, no Murmur,  Respiratory: Equal air entry bilaterally, mild bibasilar crackles and No wheezing. Abdomen: Bowel Sound present, Soft and no tenderness,  Skin: no rashes Extremities: Mild Pedal edema, no calf tenderness Neurologic: without any new focal findings Gait not checked due to patient safety concerns  Vitals:   04/06/23 1055 04/06/23 1056 04/06/23 1128 04/06/23 1342  BP:   (!) 147/105 (!) 144/105  Pulse:   (!) 108 (!) 132  Resp:   18 20  Temp:   98.3 F (36.8 C)   TempSrc:   Oral   SpO2: (!) 85% 92% 98% 98%  Weight:      Height:        Intake/Output Summary (Last 24 hours) at 04/06/2023 1439 Last data filed at 04/06/2023 1130 Gross per 24 hour  Intake 310 ml  Output 2150 ml  Net -1840 ml   Filed Weights   04/04/23 0500 04/05/23 0629 04/06/23 0459  Weight: 118.8 kg 116.4 kg 115.6 kg    Data Reviewed: I have personally reviewed and interpreted daily labs, tele strips, imagings as discussed above. I reviewed all nursing notes, pharmacy notes, vitals, pertinent old records I have discussed plan of care as  described above with RN and patient/family.  CBC: Recent Labs  Lab 04/01/23 0347 04/02/23 0516 04/03/23 0529 04/04/23 0446 04/05/23 0851 04/06/23 0436  WBC 9.8 9.9 10.5 10.6* 9.7 8.4  NEUTROABS 7.0  --   --   --   --   --   HGB 11.3* 10.8* 11.2* 11.3* 12.5 11.6*  HCT 36.6 35.9* 36.9 36.8 42.1 37.3  MCV 93.4 94.0 93.7 90.6 94.6 92.8  PLT 163 139* 150 137* 138* 121*   Basic Metabolic Panel: Recent Labs  Lab 04/02/23 0516 04/03/23 0529 04/03/23 2109 04/04/23 0446 04/05/23 0851 04/06/23 0436  NA 140 136  --  140 143 140  K 5.3* 5.2*  --  4.5 4.9 4.7  CL 100 96*  --  98 98 99  CO2 29 29  --  33* 37* 34*  GLUCOSE 302* 343*  --  157* 175* 291*  BUN  73* 85*  --  84* 81* 78*  CREATININE 2.25* 2.35*  --  1.85* 1.63* 1.68*  CALCIUM 8.6* 8.4*  --  8.7* 8.8* 8.6*  MG  --  3.1* 2.8* 2.9* 2.7* 2.6*  PHOS  --  5.9*  --  4.1 4.0 3.5    Studies: No results found.  Scheduled Meds:  apixaban  2.5 mg Oral BID   bisacodyl  10 mg Rectal QHS   diltiazem  90 mg Oral Q6H   fluticasone furoate-vilanterol  1 puff Inhalation Daily   gabapentin  300 mg Oral BID   guaiFENesin  600 mg Oral BID   insulin aspart  0-5 Units Subcutaneous QHS   insulin aspart  0-9 Units Subcutaneous TID WC   insulin glargine-yfgn  40 Units Subcutaneous BID   [START ON 04/07/2023] iron polysaccharides  150 mg Oral Daily   metoprolol tartrate  50 mg Oral BID   multivitamin with minerals  1 tablet Oral Daily   oxybutynin  10 mg Oral QHS   pantoprazole  40 mg Oral Daily   PARoxetine  40 mg Oral QHS   predniSONE  20 mg Oral Q breakfast   Followed by   Melene Muller ON 04/09/2023] predniSONE  10 mg Oral Q breakfast   rosuvastatin  2.5 mg Oral QHS   torsemide  20 mg Oral Daily   traZODone  75 mg Oral QHS   Continuous Infusions:  sodium chloride 10 mL/hr at 04/06/23 1337   PRN Meds: sodium chloride, acetaminophen, albuterol, bisacodyl, bisacodyl, dextromethorphan-guaiFENesin, hydrALAZINE, metoprolol tartrate, ondansetron (ZOFRAN) IV  Time spent: 55 minutes  Author: Gillis Santa. MD [facility] 04/06/2023 2:39 PM  To reach On-call, see care teams to locate the attending and reach out to them via [email or web address]. If 7PM-7AM, please contact night-coverage If you still have difficulty reaching the attending provider, please page the [facility] (Director on Call) for [facility] on amion for assistance.

## 2023-04-06 NOTE — Plan of Care (Signed)
  Problem: Education: Goal: Ability to describe self-care measures that may prevent or decrease complications (Diabetes Survival Skills Education) will improve Outcome: Progressing Goal: Individualized Educational Video(s) Outcome: Progressing   Problem: Coping: Goal: Ability to adjust to condition or change in health will improve Outcome: Progressing   Problem: Fluid Volume: Goal: Ability to maintain a balanced intake and output will improve Outcome: Progressing   Problem: Health Behavior/Discharge Planning: Goal: Ability to identify and utilize available resources and services will improve Outcome: Progressing Goal: Ability to manage health-related needs will improve Outcome: Progressing   Problem: Metabolic: Goal: Ability to maintain appropriate glucose levels will improve Outcome: Progressing   Problem: Nutritional: Goal: Maintenance of adequate nutrition will improve Outcome: Progressing Goal: Progress toward achieving an optimal weight will improve Outcome: Progressing   Problem: Skin Integrity: Goal: Risk for impaired skin integrity will decrease Outcome: Progressing   Problem: Tissue Perfusion: Goal: Adequacy of tissue perfusion will improve Outcome: Progressing   Problem: Education: Goal: Ability to demonstrate management of disease process will improve Outcome: Progressing Goal: Ability to verbalize understanding of medication therapies will improve Outcome: Progressing Goal: Individualized Educational Video(s) Outcome: Progressing   Problem: Activity: Goal: Capacity to carry out activities will improve Outcome: Progressing   Problem: Cardiac: Goal: Ability to achieve and maintain adequate cardiopulmonary perfusion will improve Outcome: Progressing   Problem: Education: Goal: Knowledge of disease or condition will improve Outcome: Progressing Goal: Knowledge of the prescribed therapeutic regimen will improve Outcome: Progressing Goal:  Individualized Educational Video(s) Outcome: Progressing   Problem: Activity: Goal: Ability to tolerate increased activity will improve Outcome: Progressing Goal: Will verbalize the importance of balancing activity with adequate rest periods Outcome: Progressing   Problem: Respiratory: Goal: Ability to maintain a clear airway will improve Outcome: Progressing Goal: Levels of oxygenation will improve Outcome: Progressing Goal: Ability to maintain adequate ventilation will improve Outcome: Progressing   Problem: Education: Goal: Knowledge of General Education information will improve Description: Including pain rating scale, medication(s)/side effects and non-pharmacologic comfort measures Outcome: Progressing   Problem: Health Behavior/Discharge Planning: Goal: Ability to manage health-related needs will improve Outcome: Progressing   Problem: Clinical Measurements: Goal: Ability to maintain clinical measurements within normal limits will improve Outcome: Progressing Goal: Will remain free from infection Outcome: Progressing Goal: Diagnostic test results will improve Outcome: Progressing Goal: Respiratory complications will improve Outcome: Progressing Goal: Cardiovascular complication will be avoided Outcome: Progressing   Problem: Activity: Goal: Risk for activity intolerance will decrease Outcome: Progressing   Problem: Nutrition: Goal: Adequate nutrition will be maintained Outcome: Progressing   Problem: Coping: Goal: Level of anxiety will decrease Outcome: Progressing   Problem: Elimination: Goal: Will not experience complications related to bowel motility Outcome: Progressing Goal: Will not experience complications related to urinary retention Outcome: Progressing   Problem: Pain Managment: Goal: General experience of comfort will improve Outcome: Progressing   Problem: Safety: Goal: Ability to remain free from injury will improve Outcome:  Progressing   Problem: Skin Integrity: Goal: Risk for impaired skin integrity will decrease Outcome: Progressing

## 2023-04-06 NOTE — Progress Notes (Signed)
Occupational Therapy Treatment Patient Details Name: Cheyenne Frank MRN: [id number] DOB: 1941/09/14 Today's Date: 04/06/2023   History of present illness Cheyenne Frank is an 82yoF PMH: GAD, Dm2, HTN, OSA, RLS, CVA. Presented to ED with acute onset of generalized weakness and palpitations.  Pt evaluated by PT, then transfer to unit 2A for ongoing AF RVR and persitent tachycardia. At baseline pt uses WC for most mobility needs, has been working with in home rehab.   OT comments  Pt seen for OT tx. Caregiver present and supportive throughout. Caregiver noting pt is improving and feels she is getting closer to her baseline cognitive and functional status. Pt pleasant throughout, oriented to self and hospital. Follows commands with VC. HR in 90's. OT facilitated problem solving for home setup and ADL participation with pt and her full time caregiver. Caregiver and pt deny additional DME needs and express confidence in home routines for ADL/mobility. Continues to benefit from OT services while hospitalized.    Recommendations for follow up therapy are one component of a multi-disciplinary discharge planning process, led by the attending physician.  Recommendations may be updated based on patient status, additional functional criteria and insurance authorization.    Assistance Recommended at Discharge Frequent or constant Supervision/Assistance  Patient can return home with the following  A lot of help with walking and/or transfers;A lot of help with bathing/dressing/bathroom;Assist for transportation;Help with stairs or ramp for entrance;Assistance with feeding   Equipment Recommendations  None recommended by OT    Recommendations for Other Services      Precautions / Restrictions Precautions Precautions: Fall Restrictions Weight Bearing Restrictions: No       Mobility Bed Mobility               General bed mobility comments: deferred, breakfast arrived    Transfers                          Balance                                           ADL either performed or assessed with clinical judgement   ADL Overall ADL's : At baseline;Needs assistance/impaired Eating/Feeding: Set up   Grooming: Bed level;Set up   Upper Body Bathing: Moderate assistance;Sitting   Lower Body Bathing: Maximal assistance;Sitting/lateral leans                         General ADL Comments: Pt requires assistance for all ADL completion at baseline from caregiver who was present for OT tx 7/20.    Extremity/Trunk Assessment              Vision       Perception     Praxis      Cognition Arousal/Alertness: Awake/alert Behavior During Therapy: WFL for tasks assessed/performed Overall Cognitive Status: History of cognitive impairments - at baseline                                 General Comments: caregiver notes pt near baseline cognition at this time        Exercises Other Exercises Other Exercises: OT facilitated problem solving for home setup and ADL participation with pt and her full time caregiver.    Shoulder Instructions  General Comments      Pertinent Vitals/ Pain       Pain Assessment Pain Assessment: No/denies pain  Home Living                                          Prior Functioning/Environment              Frequency  Min 1X/week        Progress Toward Goals  OT Goals(current goals can now be found in the care plan section)  Progress towards OT goals: Progressing toward goals  Acute Rehab OT Goals Patient Stated Goal: to feel better OT Goal Formulation: With patient Time For Goal Achievement: 04/20/23 Potential to Achieve Goals: Good  Plan Discharge plan remains appropriate;Frequency remains appropriate    Co-evaluation                 AM-PAC OT "6 Clicks" Daily Activity     Outcome Measure   Help from another person eating meals?: A  Little Help from another person taking care of personal grooming?: A Little Help from another person toileting, which includes using toliet, bedpan, or urinal?: A Lot Help from another person bathing (including washing, rinsing, drying)?: A Little Help from another person to put on and taking off regular upper body clothing?: A Little Help from another person to put on and taking off regular lower body clothing?: A Lot 6 Click Score: 16    End of Session    OT Visit Diagnosis: Unsteadiness on feet (R26.81);Other abnormalities of gait and mobility (R26.89)   Activity Tolerance Patient tolerated treatment well   Patient Left in bed;with call bell/phone within reach;with bed alarm set   Nurse Communication          Time: [phone] OT Time Calculation (min): 12 min  Charges: OT General Charges $OT Visit: 1 Visit OT Treatments $Self Care/Home Management : 8-22 mins  Arman Filter., MPH, MS, OTR/L ascom [phone] 04/06/23, 1:59 PM

## 2023-04-06 NOTE — Evaluation (Signed)
Physical Therapy Evaluation Patient Details Name: Cheyenne Frank MRN: [id number] DOB: 1941/05/07 Today's Date: 04/06/2023  History of Present Illness  Cheyenne Frank is an 82yoF PMH: GAD, Dm2, HTN, OSA, RLS, CVA. Presented to ED with acute onset of generalized weakness and palpitations.  Pt evaluated by PT, then transfer to unit 2A for ongoing AF RVR and persitent tachycardia. At baseline pt uses WC for most mobility needs, has been working with in home rehab.  Clinical Impression  HR trending lower at rest, but remains in 130s during 20 minutes at EOB. EOB sats in low 80s% without supplements O2. Pt partakes in EPB balance and moderate level strengthening, but ultimately session is limited out of caution due to elevated HR. Pt appears to tolerate well, does not appear exerted, but is fatigued after 20 minutes. Caregiver from home in session most of visit.       Assistance Recommended at Discharge Frequent or constant Supervision/Assistance  If plan is discharge home, recommend the following:  Can travel by private vehicle  Help with stairs or ramp for entrance;Assist for transportation;Assistance with cooking/housework;Direct supervision/assist for medications management;A lot of help with walking and/or transfers;A lot of help with bathing/dressing/bathroom        Equipment Recommendations None recommended by PT  Recommendations for Other Services       Functional Status Assessment Patient has had a recent decline in their functional status and demonstrates the ability to make significant improvements in function in a reasonable and predictable amount of time.     Precautions / Restrictions Precautions Precautions: Fall Restrictions Weight Bearing Restrictions: No      Mobility  Bed Mobility Overal bed mobility: Needs Assistance Bed Mobility: Supine to Sit, Sit to Supine     Supine to sit: HOB elevated, Min assist Sit to supine: Min assist        Transfers Overall  transfer level: Needs assistance Equipment used: Rolling walker (2 wheels) Transfers: Sit to/from Stand, Bed to chair/wheelchair/BSC Sit to Stand: Min guard, From elevated surface          Lateral/Scoot Transfers: Mod assist (cued for practice of strength and balance, but quit edifficult, not making much ground. Balance looks good.)      Ambulation/Gait                  Stairs            Wheelchair Mobility     Tilt Bed    Modified Rankin (Stroke Patients Only)       Balance                                             Pertinent Vitals/Pain Pain Assessment Pain Assessment: No/denies pain    Home Living                          Prior Function                       Hand Dominance        Extremity/Trunk Assessment                Communication      Cognition  General Comments      Exercises     Assessment/Plan    PT Assessment Patient needs continued PT services  PT Problem List Decreased strength;Cardiopulmonary status limiting activity;Decreased activity tolerance;Decreased balance;Decreased mobility;Decreased safety awareness;Decreased cognition;Obesity       PT Treatment Interventions Therapeutic exercise;Gait training;Functional mobility training;Therapeutic activities;Patient/family education;Balance training;Cognitive remediation;DME instruction    PT Goals (Current goals can be found in the Care Plan section)  Acute Rehab PT Goals Patient Stated Goal: to return home with continued HHPT PT Goal Formulation: With patient/family Time For Goal Achievement: 04/20/23 Potential to Achieve Goals: Good    Frequency Min 1X/week     Co-evaluation               AM-PAC PT "6 Clicks" Mobility  Outcome Measure Help needed turning from your back to your side while in a flat bed without using bedrails?: A Lot Help needed  moving from lying on your back to sitting on the side of a flat bed without using bedrails?: A Lot Help needed moving to and from a bed to a chair (including a wheelchair)?: A Lot Help needed standing up from a chair using your arms (e.g., wheelchair or bedside chair)?: A Lot Help needed to walk in hospital room?: A Lot Help needed climbing 3-5 steps with a railing? : Total 6 Click Score: 11    End of Session Equipment Utilized During Treatment: Oxygen Activity Tolerance: Patient tolerated treatment well;No increased pain Patient left: with call bell/phone within reach;with family/visitor present;in bed Nurse Communication: Mobility status PT Visit Diagnosis: Unsteadiness on feet (R26.81);Other abnormalities of gait and mobility (R26.89);Muscle weakness (generalized) (M62.81);Difficulty in walking, not elsewhere classified (R26.2)    Time: 6962-9528 PT Time Calculation (min) (ACUTE ONLY): 31 min   Charges:   PT Evaluation $PT Re-evaluation: 1 Re-eval PT Treatments $Therapeutic Activity: 23-37 mins PT General Charges $$ ACUTE PT VISIT: 1 Visit        1:48 PM, 04/06/23 Rosamaria Lints, PT, DPT Physical Therapist - [facility]  [phone] (ASCOM)    Peniel Hass C 04/06/2023, 1:46 PM

## 2023-04-06 NOTE — Progress Notes (Signed)
Diltazem gtt initiated at this time.    04/06/23 1530  Vitals  BP (!) 161/106  MAP (mmHg) 123  BP Location Right Arm  BP Method Automatic  Patient Position (if appropriate) Lying  Pulse Rate (!) 132  Pulse Rate Source Monitor  ECG Heart Rate (!) 133  Resp 19  Oxygen Therapy  SpO2 98 %  O2 Device Nasal Cannula  O2 Flow Rate (L/min) 3 L/min  MEWS Score  MEWS Temp 0  MEWS Systolic 0  MEWS Pulse 3  MEWS RR 0  MEWS LOC 0  MEWS Score 3  MEWS Score Color Yellow

## 2023-04-06 NOTE — Progress Notes (Signed)
SaO2 on room air at rest = 85% SaO2 on 3 liters of O2 while at rest = 92%    04/06/23 1055 04/06/23 1056  Oxygen Therapy  SpO2 (!) 85 % 92 %  O2 Device Room Air Nasal Cannula  O2 Flow Rate (L/min)  --  3 L/min

## 2023-04-06 NOTE — TOC Progression Note (Signed)
Transition of Care [facility]) - Progression Note    Patient Details  Name: JENSYN SHAVE MRN: [id number] Date of Birth: [birth year]  Transition of [facility]) CM/SW Contact  Bing Quarry, RN Phone Number: 04/06/2023, 11:08 AM  Clinical Narrative: 7/20: Disposition pending a new PT/OT evaluation. Oxygen saturation test done on RA and at rest documented by unit RN this am:  SaO2 on room air at rest = 85% SaO2 on 3 liters of O2 while at rest = 92%   End of note.  Gabriel Cirri MSN RN CM  Transitions of Care Department [facility] [phone] Weekends Only          Expected Discharge Plan and Services                                               Social Determinants of Health (SDOH) Interventions SDOH Screenings   Food Insecurity: Patient Declined (04/01/2023)  Housing: Patient Declined (04/01/2023)  Transportation Needs: Patient Declined (04/01/2023)  Utilities: Patient Declined (04/01/2023)  Financial Resource Strain: Low Risk  (07/08/2017)   Received from [facility], [facility]  Physical Activity: Inactive (07/08/2017)   Received from [facility], [facility]  Social Connections: Unknown (07/08/2017)   Received from [facility], [facility]  Tobacco Use: Low Risk  (04/01/2023)    Readmission Risk Interventions    02/22/2023   10:37 AM 07/18/2022    3:27 PM 01/28/2022    2:28 PM  Readmission Risk Prevention Plan  Transportation Screening Complete Complete Complete  PCP or Specialist Appt within 3-5 Days   Complete  HRI or Home Care Consult   Complete  Social Work Consult for Recovery Care Planning/Counseling   Complete  Palliative Care Screening   Complete  Medication Review Oceanographer) Complete Complete Complete  PCP or Specialist appointment within 3-5 days of discharge Complete Complete   HRI or Home Care Consult  Complete   SW Recovery  Care/Counseling Consult Complete Not Complete   SW Consult Not Complete Comments  NA   Palliative Care Screening Not Applicable Not Applicable   Skilled Nursing Facility Not Applicable Not Applicable

## 2023-04-06 NOTE — Plan of Care (Signed)
Alert, oriented. HR noted to be 130s, Dr. Lucianne Muss notified. Cardizem gtt started and infused briefly then stopped due to discontinuation. Cardiology MD adjusted medications. Educated patient on new cardiac medications.   Problem: Education: Goal: Ability to describe self-care measures that may prevent or decrease complications (Diabetes Survival Skills Education) will improve Outcome: Progressing   Problem: Coping: Goal: Ability to adjust to condition or change in health will improve Outcome: Progressing   Problem: Fluid Volume: Goal: Ability to maintain a balanced intake and output will improve Outcome: Progressing   Problem: Education: Goal: Ability to demonstrate management of disease process will improve Outcome: Progressing Goal: Ability to verbalize understanding of medication therapies will improve Outcome: Progressing   Problem: Cardiac: Goal: Ability to achieve and maintain adequate cardiopulmonary perfusion will improve Outcome: Progressing   Problem: Respiratory: Goal: Ability to maintain a clear airway will improve Outcome: Progressing Goal: Levels of oxygenation will improve Outcome: Progressing   Problem: Safety: Goal: Ability to remain free from injury will improve Outcome: Progressing   Problem: Skin Integrity: Goal: Risk for impaired skin integrity will decrease Outcome: Progressing

## 2023-04-06 NOTE — Progress Notes (Signed)
Medicated with 5 mg IV metoprolol at this time.    04/06/23 1342  Assess: MEWS Score  BP (!) 144/105  MAP (mmHg) 118  Pulse Rate (!) 132  ECG Heart Rate (!) 133  Resp 20  SpO2 98 %  O2 Device Nasal Cannula  O2 Flow Rate (L/min) 3 L/min  Assess: MEWS Score  MEWS Temp 0  MEWS Systolic 0  MEWS Pulse 3  MEWS RR 0  MEWS LOC 0  MEWS Score 3  MEWS Score Color Yellow  Assess: if the MEWS score is Yellow or Red  Were vital signs taken at a resting state? Yes  Focused Assessment No change from prior assessment  Does the patient meet 2 or more of the SIRS criteria? No  MEWS guidelines implemented  No, previously yellow, continue vital signs every 4 hours  Assess: SIRS CRITERIA  SIRS Temperature  0  SIRS Pulse 1  SIRS Respirations  0  SIRS WBC 0  SIRS Score Sum  1

## 2023-04-06 NOTE — Progress Notes (Signed)
[facility]    SUBJECTIVE: Patient states to be doing reasonably well denies palpitations tachycardia rate is running relatively high has had pleural effusion shortness of breath all symptoms are improved   Vitals:   04/06/23 0514 04/06/23 0816 04/06/23 1055 04/06/23 1056  BP:  (!) 154/78    Pulse: 91 67    Resp: 20 16    Temp:  97.6 F (36.4 C)    TempSrc:  Axillary    SpO2: 99% 98% (!) 85% 92%  Weight:      Height:         Intake/Output Summary (Last 24 hours) at 04/06/2023 1151 Last data filed at 04/06/2023 1610 Gross per 24 hour  Intake 310 ml  Output 1450 ml  Net -1140 ml      PHYSICAL EXAM  General: Well developed, well nourished, in no acute distress HEENT:  Normocephalic and atramatic Neck:  No JVD.  Lungs: Clear bilaterally to auscultation and percussion. Heart: Tachycardia rate of 130. Normal S1 and S2 without gallops or murmurs.  Abdomen: Bowel sounds are positive, abdomen soft and non-tender  Msk:  Back normal, normal gait. Normal strength and tone for age. Extremities: No clubbing, cyanosis or edema.   Neuro: Alert and oriented X 3. Psych:  Good affect, responds appropriately   LABS: Basic Metabolic Panel: Recent Labs    04/05/23 0851 04/06/23 0436  NA 143 140  K 4.9 4.7  CL 98 99  CO2 37* 34*  GLUCOSE 175* 291*  BUN 81* 78*  CREATININE 1.63* 1.68*  CALCIUM 8.8* 8.6*  MG 2.7* 2.6*  PHOS 4.0 3.5   Liver Function Tests: No results for input(s): "AST", "ALT", "ALKPHOS", "BILITOT", "PROT", "ALBUMIN" in the last 72 hours. No results for input(s): "LIPASE", "AMYLASE" in the last 72 hours. CBC: Recent Labs    04/05/23 0851 04/06/23 0436  WBC 9.7 8.4  HGB 12.5 11.6*  HCT 42.1 37.3  MCV 94.6 92.8  PLT 138* 121*   Cardiac Enzymes: No results for input(s): "CKTOTAL", "CKMB", "CKMBINDEX", "TROPONINI" in the last 72 hours. BNP: Invalid input(s): "POCBNP" D-Dimer: No results for input(s): "DDIMER" in the last 72 hours. Hemoglobin  A1C: No results for input(s): "HGBA1C" in the last 72 hours. Fasting Lipid Panel: No results for input(s): "CHOL", "HDL", "LDLCALC", "TRIG", "CHOLHDL", "LDLDIRECT" in the last 72 hours. Thyroid Function Tests: No results for input(s): "TSH", "T4TOTAL", "T3FREE", "THYROIDAB" in the last 72 hours.  Invalid input(s): "FREET3" Anemia Panel: No results for input(s): "VITAMINB12", "FOLATE", "FERRITIN", "TIBC", "IRON", "RETICCTPCT" in the last 72 hours.  No results found.   Echo   TELEMETRY: Atrial flutter tachycardia rate of 130 narrow complex:  ASSESSMENT AND PLAN:  Principal Problem:   COPD exacerbation (HCC) Active Problems:   HTN (hypertension)   Obesity, Class III, BMI 40-49.9 (morbid obesity) (HCC)   Stage 3b chronic kidney disease (CKD) (HCC)   Acute on chronic diastolic CHF (congestive heart failure) (HCC)   Stroke (HCC)   Atrial fibrillation with rapid ventricular response (HCC)   HLD (hyperlipidemia)   Type II diabetes mellitus with renal manifestations (HCC)    Plan Atrial fibrillation continue Eliquis Cardizem Lopressor as needed for rate control COPD moderate continue inhalers continue follow-up with pulmonary continue anticoagulation therapy Acute on chronic diastolic heart failure continue current therapy diuretics as necessary follow daily weights Hypokalemia agree with leukemia follow-up potassium levels Hypertension reasonably controlled continue Cardizem and Lopressor CVA continue Eliquis as it may be related to atrial fibrillation continue anticoagulation Chronic  renal sufficiency stage IIIb follow-up with nephrology maintain adequate hydration Hyperlipidemia continue Crestor therapy for lipid management Diabetes type 2 uncomplicated currently on NovoLog Januvia Lantus follow-up with primary physician Iron deficiency anemia Hypoxemia continue supplemental oxygen therapy Continue current medical therapy   Alwyn Pea, MD 04/06/2023 11:51  AM

## 2023-04-07 DIAGNOSIS — J441 Chronic obstructive pulmonary disease with (acute) exacerbation: Secondary | ICD-10-CM | POA: Diagnosis not present

## 2023-04-07 LAB — GLUCOSE, CAPILLARY
Glucose-Capillary: 161 mg/dL — ABNORMAL HIGH (ref 70–99)
Glucose-Capillary: 154 mg/dL — ABNORMAL HIGH (ref 70–99)
Glucose-Capillary: 194 mg/dL — ABNORMAL HIGH (ref 70–99)
Glucose-Capillary: 209 mg/dL — ABNORMAL HIGH (ref 70–99)

## 2023-04-07 LAB — BASIC METABOLIC PANEL
Anion gap: 4 — ABNORMAL LOW (ref 5–15)
BUN: 74 mg/dL — ABNORMAL HIGH (ref 8–23)
CO2: 36 mmol/L — ABNORMAL HIGH (ref 22–32)
Calcium: 8.8 mg/dL — ABNORMAL LOW (ref 8.9–10.3)
Chloride: 100 mmol/L (ref 98–111)
Creatinine, Ser: 1.58 mg/dL — ABNORMAL HIGH (ref 0.44–1.00)
GFR, Estimated: 33 mL/min — ABNORMAL LOW (ref >60)
Glucose, Bld: 243 mg/dL — ABNORMAL HIGH (ref 70–99)
Potassium: 4.6 mmol/L (ref 3.5–5.1)
Sodium: 140 mmol/L (ref 135–145)

## 2023-04-07 LAB — MAGNESIUM: Magnesium: 2.5 mg/dL — ABNORMAL HIGH (ref 1.7–2.4)

## 2023-04-07 LAB — CBC
HCT: 37.6 % (ref 36.0–46.0)
Hemoglobin: 11.7 g/dL — ABNORMAL LOW (ref 12.0–15.0)
MCH: 28.9 pg (ref 26.0–34.0)
MCHC: 31.1 g/dL (ref 30.0–36.0)
MCV: 92.8 fL (ref 80.0–100.0)
Platelets: 130 10*3/uL — ABNORMAL LOW (ref 150–400)
RBC: 4.05 MIL/uL (ref 3.87–5.11)
RDW: 15.3 % (ref 11.5–15.5)
WBC: 9.7 10*3/uL (ref 4.0–10.5)
nRBC: 0 % (ref 0.0–0.2)

## 2023-04-07 LAB — PHOSPHORUS: Phosphorus: 3.5 mg/dL (ref 2.5–4.6)

## 2023-04-07 NOTE — Progress Notes (Signed)
[facility] Progress Note  Patient: Cheyenne Frank    WNU:[id number]  DOA: 04/01/2023     Date of Service: the patient was seen and examined on 04/07/2023  Chief Complaint  Patient presents with   Shortness of Breath    Pt. In via [facility] from home for SOB x 1 day, worse tonight, HX of COPD, EMS gave 2 duoneb and 125 solumedrol PTA   Brief hospital course: Cheyenne Frank is a 82 y.o. female with medical history significant of HTN, JHLD, DM,  COPD not on O2, dCHF, stroke, dementia, depression, CKD-4, morbid obesity, atrial fibrillation on Eliquis, subdural hematoma, RLS, fibromyalgia, who presents with shortness of breath.   Per her daughter at the bedside, patient has shortness of breath for more than 2 days, which has been progressively worsening.  He has dry cough, no chest pain, fever or chills.  Patient has wheezing.  Normally patient is not using oxygen at home, but was found to have oxygen desaturation to 87% on room air, which improved to 100% on 3 L oxygen.  Patient does not have nausea, vomiting, diarrhea or abdominal pain.  No symptoms of UTI.     Data reviewed independently and ED Course: pt was found to have negative COVID PCR, WBC 9.8, BNP 524, stable renal function, temperature normal, blood pressure 137/50, heart rate 127, 78, RR 22.  VBG with pH 7.32, CO2 57, O2 88.  Patient is admitted to telemetry bed as inpatient.   Chest x-ray: 1. Bilateral pleural effusions, small but that on the left appears increased since last month. Bilateral lung base atelectasis suspected. 2. Cardiomegaly with increased pulmonary vascularity. Cannot exclude mild or developing interstitial edema. 3. Question underlying Tracheomalacia.   EKG: Sinus rhythm, QTc 459, LAE, LAD, T wave inversion in the lateral leads and V5-V6.  Assessment and Plan: Principal Problem:   COPD exacerbation (HCC) Active Problems:   Acute on chronic diastolic CHF (congestive heart failure) (HCC)   Atrial  fibrillation with rapid ventricular response (HCC)   HTN (hypertension)   Stroke (HCC)   Stage 3b chronic kidney disease (CKD) (HCC)   HLD (hyperlipidemia)   Obesity, Class III, BMI 40-49.9 (morbid obesity) (HCC)   Type II diabetes mellitus with renal manifestations (HCC)     # Atrial fibrillation with rapid ventricular response Continue Eliquis and Cardizem Continue as needed IV Lopressor 7/18 last night patient developed atrial flutter, Cardizem IV infusion was started. 7/19 increased Cardizem 90 mg p.o. every 6 hourly as per cardiology 7/19 Metoprolol 50 mg p.o. BID  S/p Cardizem IV infusion, gradually weaned off Cardiology consult appreciated 7/21 patient had A-fib with RVR on 7/20 in the evening, Cardizem infusion order was placed but cardiology discontinued and started on sotalol 80 mg p.o. twice daily, increased Lopressor 50 mg p.o. 3 times daily, continued Cardizem 90 every 6 hourly  # COPD exacerbation: Patient has cough, shortness of breath, wheezing, consistent with COPD exacerbation.  Patient has a 3 L new oxygen requirement.  No fever or leukocytosis clinically does not seem to have pneumonia. S/p azithromycin IV given in the ED, s/p azithromycin 250 mg p.o. daily x 4 days S/p IV Solu-Medrol, started tapering dose of oral prednisone Continue DuoNeb every 6 hourly scheduled, transition to as needed after improvement Started Breo Ellipta inhaler Mucinex 600 mg p.o. twice daily, Mucinex DM as needed for cough Continue supplemental O2 admission Continue incentive spirometry    # Acute on chronic diastolic CHF (congestive heart failure) (HCC):  2D echo on 09/10/2022 showed EF of 70-75% with grade 2 diastolic dysfunction.  Patient has 1+ leg edema, elevated BNP 524, clinically consistent with CHF exacerbation. -s/p Lasix 40 mg IV bid, decrease Lasix 20 mg p.o. daily, transition to torsemide 20 mh pod on 7/20 -Daily weights -strict I/O's -Low salt diet -Fluid restriction -As  needed bronchodilators for shortness of breath   Hyperkalemia, Resolved  7/16 s/p Lokelma 10 g every 8 hourly x 2 doses but no BM 7/16 Lokelma 10 mg x 2 doses ordered Monitor potassium level daily Low potassium diet    HTN (hypertension) -IV hydralazine as needed -Cardizem and Lopressor Monitor BP and titrate medications accordingly  Stroke  Continue Crestor and Eliquis due to A/fib    Stage 3b chronic kidney disease (CKD): Close to recent baseline creatinine.  Recent baseline creatinine 2.47 on 02/23/2023.   creatinine 2.12--2.25 --2.35---1.85--1.58 -Follow-up  renal function by BMP   HLD (hyperlipidemia) -Crestor   Obesity, Class III, BMI 40-49.9 (morbid obesity) : Body weight 110 kg, BMI 44.35 -Encourage losing weight -Exercise and healthy diet   Diabetes mellitus with renal complications: Recent A1c 10.2, poorly controlled.  Patient taking NovoLog, Januvia, Lantus 64 units daily -Sliding scale insulin 7/17 increased Lantus 40 units BID  (64 u Home dose) Hyperglycemia secondary to steroids Follow diabetic coordinator consult Continue diabetic diet, monitor CBG  Iron deficiency anemia, transferrin saturation 8%, Venofer 200 mg IV x 5 doses followed by oral supplement Folic acid level within normal range  Body mass index is 45.21 kg/m.  Interventions:  Diet: Diabetic and heart healthy diet DVT Prophylaxis: Subcutaneous Lovenox   Advance goals of care discussion: DNR  Family Communication: family was present at bedside, at the time of interview.  The pt provided permission to discuss medical plan with the family. Opportunity was given to ask question and all questions were answered satisfactorily.   Disposition:  Pt is from Home, admitted with COPD and CHF exacerbation, developed A. Flutter, s/p dilt gtt, still tachy, which precludes a safe discharge. Discharge to home, when stable and cleared by cardiology.   Subjective: No significant events overnight,  yesterday evening patient developed A-fib with RVR, Cardizem infusion was briefly started but discontinued by cardiology and medication adjusted.  Patient was resting comfortably today, stated that her breathing is fine, denies any chest pain or palpitations, no worsening of shortness of breath.  Family was at bedside.  Management plan discussed that we will continue to monitor today as cardiology started sotalol.  If remains stable then probably we will discharge her tomorrow morning.  Physical Exam: General: NAD, lying comfortably Appear in no distress, affect appropriate Eyes: PERRLA ENT: Oral Mucosa Clear, moist  Neck: no JVD,  Cardiovascular: Irregular rhythm, no Murmur,  Respiratory: Equal air entry bilaterally, mild bibasilar crackles and No wheezing. Abdomen: Bowel Sound present, Soft and no tenderness,  Skin: no rashes Extremities: Mild Pedal edema, no calf tenderness Neurologic: without any new focal findings Gait not checked due to patient safety concerns  Vitals:   04/06/23 2323 04/07/23 0004 04/07/23 0419 04/07/23 0810  BP: 123/84  129/66 132/78  Pulse: (!) 125 (!) 124 65 66  Resp: 20 20 20 16   Temp: 98.2 F (36.8 C)  97.9 F (36.6 C) 98 F (36.7 C)  TempSrc:      SpO2: 98% 99% 98% 95%  Weight:   115.8 kg   Height:        Intake/Output Summary (Last 24 hours) at 04/07/2023 1621  Last data filed at 04/07/2023 1537 Gross per 24 hour  Intake 724.4 ml  Output 2950 ml  Net -2225.6 ml   Filed Weights   04/05/23 0629 04/06/23 0459 04/07/23 0419  Weight: 116.4 kg 115.6 kg 115.8 kg    Data Reviewed: I have personally reviewed and interpreted daily labs, tele strips, imagings as discussed above. I reviewed all nursing notes, pharmacy notes, vitals, pertinent old records I have discussed plan of care as described above with RN and patient/family.  CBC: Recent Labs  Lab 04/01/23 0347 04/02/23 0516 04/03/23 0529 04/04/23 0446 04/05/23 0851 04/06/23 0436  04/07/23 0538  WBC 9.8   < > 10.5 10.6* 9.7 8.4 9.7  NEUTROABS 7.0  --   --   --   --   --   --   HGB 11.3*   < > 11.2* 11.3* 12.5 11.6* 11.7*  HCT 36.6   < > 36.9 36.8 42.1 37.3 37.6  MCV 93.4   < > 93.7 90.6 94.6 92.8 92.8  PLT 163   < > 150 137* 138* 121* 130*   < > = values in this interval not displayed.   Basic Metabolic Panel: Recent Labs  Lab 04/03/23 0529 04/03/23 2109 04/04/23 0446 04/05/23 0851 04/06/23 0436 04/07/23 0538  NA 136  --  140 143 140 140  K 5.2*  --  4.5 4.9 4.7 4.6  CL 96*  --  98 98 99 100  CO2 29  --  33* 37* 34* 36*  GLUCOSE 343*  --  157* 175* 291* 243*  BUN 85*  --  84* 81* 78* 74*  CREATININE 2.35*  --  1.85* 1.63* 1.68* 1.58*  CALCIUM 8.4*  --  8.7* 8.8* 8.6* 8.8*  MG 3.1* 2.8* 2.9* 2.7* 2.6* 2.5*  PHOS 5.9*  --  4.1 4.0 3.5 3.5    Studies: No results found.  Scheduled Meds:  apixaban  2.5 mg Oral BID   bisacodyl  10 mg Rectal QHS   diltiazem  90 mg Oral Q6H   fluticasone furoate-vilanterol  1 puff Inhalation Daily   gabapentin  300 mg Oral BID   insulin aspart  0-5 Units Subcutaneous QHS   insulin aspart  0-9 Units Subcutaneous TID WC   insulin glargine-yfgn  40 Units Subcutaneous BID   iron polysaccharides  150 mg Oral Daily   metoprolol tartrate  50 mg Oral TID   multivitamin with minerals  1 tablet Oral Daily   oxybutynin  10 mg Oral QHS   pantoprazole  40 mg Oral Daily   PARoxetine  40 mg Oral QHS   predniSONE  20 mg Oral Q breakfast   Followed by   Melene Muller ON 04/09/2023] predniSONE  10 mg Oral Q breakfast   rosuvastatin  2.5 mg Oral QHS   sotalol  80 mg Oral Q12H   torsemide  20 mg Oral Daily   traZODone  75 mg Oral QHS   Continuous Infusions:  sodium chloride Stopped (04/06/23 1528)   PRN Meds: sodium chloride, acetaminophen, albuterol, bisacodyl, bisacodyl, dextromethorphan-guaiFENesin, hydrALAZINE, metoprolol tartrate, ondansetron (ZOFRAN) IV  Time spent: 55 minutes  Author: Gillis Santa. MD [facility] 04/07/2023 4:21 PM  To reach On-call, see care teams to locate the attending and reach out to them via [email or web address]. If 7PM-7AM, please contact night-coverage If you still have difficulty reaching the attending provider, please page the [facility] (Director on Call) for [facility] on amion for assistance.

## 2023-04-07 NOTE — Progress Notes (Signed)
Patient ID: Cheyenne Frank, female   DOB: 1941/05/26, 82 y.o.   MRN: [id number] [facility]    SUBJECTIVE: Patient resting comfortably still short of breath denies any chest pain   Vitals:   04/06/23 2323 04/07/23 0004 04/07/23 0419 04/07/23 0810  BP: 123/84  129/66 132/78  Pulse: (!) 125 (!) 124 65 66  Resp: 20 20 20 16   Temp: 98.2 F (36.8 C)  97.9 F (36.6 C) 98 F (36.7 C)  TempSrc:      SpO2: 98% 99% 98% 95%  Weight:   115.8 kg   Height:         Intake/Output Summary (Last 24 hours) at 04/07/2023 1331 Last data filed at 04/07/2023 0419 Gross per 24 hour  Intake 940.02 ml  Output 1950 ml  Net -1009.98 ml      PHYSICAL EXAM  General: Well developed, well nourished, in no acute distress HEENT:  Normocephalic and atramatic Neck:  No JVD.  Lungs: Clear bilaterally to auscultation and percussion. Heart: HRRR . Normal S1 and S2 without gallops or murmurs.  Abdomen: Bowel sounds are positive, abdomen soft and non-tender  Msk:  Back normal, normal gait. Normal strength and tone for age. Extremities: No clubbing, cyanosis or edema.   Neuro: Alert and oriented X 3. Psych:  Good affect, responds appropriately   LABS: Basic Metabolic Panel: Recent Labs    04/06/23 0436 04/07/23 0538  NA 140 140  K 4.7 4.6  CL 99 100  CO2 34* 36*  GLUCOSE 291* 243*  BUN 78* 74*  CREATININE 1.68* 1.58*  CALCIUM 8.6* 8.8*  MG 2.6* 2.5*  PHOS 3.5 3.5   Liver Function Tests: No results for input(s): "AST", "ALT", "ALKPHOS", "BILITOT", "PROT", "ALBUMIN" in the last 72 hours. No results for input(s): "LIPASE", "AMYLASE" in the last 72 hours. CBC: Recent Labs    04/06/23 0436 04/07/23 0538  WBC 8.4 9.7  HGB 11.6* 11.7*  HCT 37.3 37.6  MCV 92.8 92.8  PLT 121* 130*   Cardiac Enzymes: No results for input(s): "CKTOTAL", "CKMB", "CKMBINDEX", "TROPONINI" in the last 72 hours. BNP: Invalid input(s): "POCBNP" D-Dimer: No results for input(s): "DDIMER" in the last 72  hours. Hemoglobin A1C: No results for input(s): "HGBA1C" in the last 72 hours. Fasting Lipid Panel: No results for input(s): "CHOL", "HDL", "LDLCALC", "TRIG", "CHOLHDL", "LDLDIRECT" in the last 72 hours. Thyroid Function Tests: No results for input(s): "TSH", "T4TOTAL", "T3FREE", "THYROIDAB" in the last 72 hours.  Invalid input(s): "FREET3" Anemia Panel: No results for input(s): "VITAMINB12", "FOLATE", "FERRITIN", "TIBC", "IRON", "RETICCTPCT" in the last 72 hours.  No results found.   Echo preserved left ventricular function EF of 60%  TELEMETRY: Atrial fibrillation rate of 80:  ASSESSMENT AND PLAN:  Principal Problem:   COPD exacerbation (HCC) Active Problems:   HTN (hypertension)   Obesity, Class III, BMI 40-49.9 (morbid obesity) (HCC)   Stage 3b chronic kidney disease (CKD) (HCC)   Acute on chronic diastolic CHF (congestive heart failure) (HCC)   Stroke (HCC)   Atrial fibrillation with rapid ventricular response (HCC)   HLD (hyperlipidemia)   Type II diabetes mellitus with renal manifestations (HCC)    Plan COPD moderate to severe continue inhalers follow-up with pulmonary Atrial fibrillation paroxysmal continue anticoagulation and rate control Obesity recommend modest weight loss exercise portion control Possible obstructive sleep recommend sleep study CPAP if indicated weight loss History of stroke presumably secondary to A-fib maintain adequate anticoagulation Recommend statin therapy for hyperlipidemia Diabetes type 2 uncomplicated continue  medical therapy follow-up with primary physician Maintain conservative cardiac input at this stage   Alwyn Pea, MD, 04/07/2023 1:31 PM

## 2023-04-07 NOTE — Plan of Care (Signed)
  Problem: Education: Goal: Ability to describe self-care measures that may prevent or decrease complications (Diabetes Survival Skills Education) will improve Outcome: Progressing Goal: Individualized Educational Video(s) Outcome: Progressing   Problem: Coping: Goal: Ability to adjust to condition or change in health will improve Outcome: Progressing   Problem: Fluid Volume: Goal: Ability to maintain a balanced intake and output will improve Outcome: Progressing   Problem: Health Behavior/Discharge Planning: Goal: Ability to identify and utilize available resources and services will improve Outcome: Progressing Goal: Ability to manage health-related needs will improve Outcome: Progressing   Problem: Metabolic: Goal: Ability to maintain appropriate glucose levels will improve Outcome: Progressing   Problem: Nutritional: Goal: Maintenance of adequate nutrition will improve Outcome: Progressing Goal: Progress toward achieving an optimal weight will improve Outcome: Progressing   Problem: Skin Integrity: Goal: Risk for impaired skin integrity will decrease Outcome: Progressing   Problem: Tissue Perfusion: Goal: Adequacy of tissue perfusion will improve Outcome: Progressing   Problem: Education: Goal: Ability to demonstrate management of disease process will improve Outcome: Progressing Goal: Ability to verbalize understanding of medication therapies will improve Outcome: Progressing Goal: Individualized Educational Video(s) Outcome: Progressing   Problem: Activity: Goal: Capacity to carry out activities will improve Outcome: Progressing   Problem: Cardiac: Goal: Ability to achieve and maintain adequate cardiopulmonary perfusion will improve Outcome: Progressing   Problem: Education: Goal: Knowledge of disease or condition will improve Outcome: Progressing Goal: Knowledge of the prescribed therapeutic regimen will improve Outcome: Progressing Goal:  Individualized Educational Video(s) Outcome: Progressing   Problem: Activity: Goal: Ability to tolerate increased activity will improve Outcome: Progressing Goal: Will verbalize the importance of balancing activity with adequate rest periods Outcome: Progressing   Problem: Respiratory: Goal: Ability to maintain a clear airway will improve Outcome: Progressing Goal: Levels of oxygenation will improve Outcome: Progressing Goal: Ability to maintain adequate ventilation will improve Outcome: Progressing   Problem: Education: Goal: Knowledge of General Education information will improve Description: Including pain rating scale, medication(s)/side effects and non-pharmacologic comfort measures Outcome: Progressing   Problem: Health Behavior/Discharge Planning: Goal: Ability to manage health-related needs will improve Outcome: Progressing   Problem: Clinical Measurements: Goal: Ability to maintain clinical measurements within normal limits will improve Outcome: Progressing Goal: Will remain free from infection Outcome: Progressing Goal: Diagnostic test results will improve Outcome: Progressing Goal: Respiratory complications will improve Outcome: Progressing Goal: Cardiovascular complication will be avoided Outcome: Progressing   Problem: Activity: Goal: Risk for activity intolerance will decrease Outcome: Progressing   Problem: Nutrition: Goal: Adequate nutrition will be maintained Outcome: Progressing   Problem: Coping: Goal: Level of anxiety will decrease Outcome: Progressing   Problem: Elimination: Goal: Will not experience complications related to bowel motility Outcome: Progressing Goal: Will not experience complications related to urinary retention Outcome: Progressing   Problem: Pain Managment: Goal: General experience of comfort will improve Outcome: Progressing   Problem: Safety: Goal: Ability to remain free from injury will improve Outcome:  Progressing   Problem: Skin Integrity: Goal: Risk for impaired skin integrity will decrease Outcome: Progressing

## 2023-04-08 ENCOUNTER — Emergency Department
Admission: EM | Admit: 2023-04-08 | Discharge: 2023-04-11 | Disposition: A | Discharge status: Rehabilitation Facility | Payer: Medicare Other | Attending: Emergency Medicine | Admitting: Emergency Medicine

## 2023-04-08 ENCOUNTER — Emergency Department: Payer: Medicare Other

## 2023-04-08 ENCOUNTER — Encounter: Payer: Self-pay | Admitting: Emergency Medicine

## 2023-04-08 DIAGNOSIS — R5381 Other malaise: Secondary | ICD-10-CM | POA: Insufficient documentation

## 2023-04-08 DIAGNOSIS — R531 Weakness: Secondary | ICD-10-CM | POA: Insufficient documentation

## 2023-04-08 DIAGNOSIS — Z7901 Long term (current) use of anticoagulants: Secondary | ICD-10-CM | POA: Insufficient documentation

## 2023-04-08 DIAGNOSIS — E1122 Type 2 diabetes mellitus with diabetic chronic kidney disease: Secondary | ICD-10-CM | POA: Insufficient documentation

## 2023-04-08 DIAGNOSIS — N184 Chronic kidney disease, stage 4 (severe): Secondary | ICD-10-CM | POA: Insufficient documentation

## 2023-04-08 DIAGNOSIS — I5030 Unspecified diastolic (congestive) heart failure: Secondary | ICD-10-CM | POA: Insufficient documentation

## 2023-04-08 DIAGNOSIS — J449 Chronic obstructive pulmonary disease, unspecified: Secondary | ICD-10-CM | POA: Insufficient documentation

## 2023-04-08 DIAGNOSIS — J441 Chronic obstructive pulmonary disease with (acute) exacerbation: Secondary | ICD-10-CM | POA: Diagnosis not present

## 2023-04-08 DIAGNOSIS — F0390 Unspecified dementia without behavioral disturbance: Secondary | ICD-10-CM | POA: Insufficient documentation

## 2023-04-08 DIAGNOSIS — Z8673 Personal history of transient ischemic attack (TIA), and cerebral infarction without residual deficits: Secondary | ICD-10-CM | POA: Insufficient documentation

## 2023-04-08 DIAGNOSIS — I130 Hypertensive heart and chronic kidney disease with heart failure and stage 1 through stage 4 chronic kidney disease, or unspecified chronic kidney disease: Secondary | ICD-10-CM | POA: Insufficient documentation

## 2023-04-08 LAB — CBC
HCT: 40 % (ref 36.0–46.0)
HCT: 41.7 % (ref 36.0–46.0)
Hemoglobin: 12.3 g/dL (ref 12.0–15.0)
Hemoglobin: 12.8 g/dL (ref 12.0–15.0)
MCH: 28.3 pg (ref 26.0–34.0)
MCH: 28.4 pg (ref 26.0–34.0)
MCHC: 30.8 g/dL (ref 30.0–36.0)
MCHC: 30.7 g/dL (ref 30.0–36.0)
MCV: 92.2 fL (ref 80.0–100.0)
MCV: 92.7 fL (ref 80.0–100.0)
Platelets: 158 10*3/uL (ref 150–400)
Platelets: 145 10*3/uL — ABNORMAL LOW (ref 150–400)
RBC: 4.34 MIL/uL (ref 3.87–5.11)
RBC: 4.5 MIL/uL (ref 3.87–5.11)
RDW: 15.5 % (ref 11.5–15.5)
RDW: 15.3 % (ref 11.5–15.5)
WBC: 10.9 10*3/uL — ABNORMAL HIGH (ref 4.0–10.5)
WBC: 9.5 10*3/uL (ref 4.0–10.5)
nRBC: 0 % (ref 0.0–0.2)
nRBC: 0 % (ref 0.0–0.2)

## 2023-04-08 LAB — BASIC METABOLIC PANEL
Anion gap: 8 (ref 5–15)
Anion gap: 11 (ref 5–15)
BUN: 79 mg/dL — ABNORMAL HIGH (ref 8–23)
BUN: 82 mg/dL — ABNORMAL HIGH (ref 8–23)
CO2: 37 mmol/L — ABNORMAL HIGH (ref 22–32)
CO2: 34 mmol/L — ABNORMAL HIGH (ref 22–32)
Calcium: 8.9 mg/dL (ref 8.9–10.3)
Calcium: 8.9 mg/dL (ref 8.9–10.3)
Chloride: 98 mmol/L (ref 98–111)
Chloride: 97 mmol/L — ABNORMAL LOW (ref 98–111)
Creatinine, Ser: 1.62 mg/dL — ABNORMAL HIGH (ref 0.44–1.00)
Creatinine, Ser: 1.74 mg/dL — ABNORMAL HIGH (ref 0.44–1.00)
GFR, Estimated: 32 mL/min — ABNORMAL LOW (ref >60)
GFR, Estimated: 29 mL/min — ABNORMAL LOW (ref >60)
Glucose, Bld: 117 mg/dL — ABNORMAL HIGH (ref 70–99)
Glucose, Bld: 112 mg/dL — ABNORMAL HIGH (ref 70–99)
Potassium: 4.8 mmol/L (ref 3.5–5.1)
Potassium: 5.2 mmol/L — ABNORMAL HIGH (ref 3.5–5.1)
Sodium: 143 mmol/L (ref 135–145)
Sodium: 142 mmol/L (ref 135–145)

## 2023-04-08 LAB — PHOSPHORUS: Phosphorus: 3.8 mg/dL (ref 2.5–4.6)

## 2023-04-08 LAB — TROPONIN I (HIGH SENSITIVITY): Troponin I (High Sensitivity): 15 ng/L (ref <18)

## 2023-04-08 LAB — MAGNESIUM: Magnesium: 2.4 mg/dL (ref 1.7–2.4)

## 2023-04-08 LAB — GLUCOSE, CAPILLARY
Glucose-Capillary: 117 mg/dL — ABNORMAL HIGH (ref 70–99)
Glucose-Capillary: 244 mg/dL — ABNORMAL HIGH (ref 70–99)

## 2023-04-08 MED ORDER — IPRATROPIUM-ALBUTEROL 0.5-2.5 (3) MG/3ML IN SOLN
3.0000 mL | Freq: Once | RESPIRATORY_TRACT | Status: AC
  Administered 2023-04-08: 3 mL via RESPIRATORY_TRACT
  Filled 2023-04-08: qty 3

## 2023-04-08 MED ORDER — DILTIAZEM HCL ER COATED BEADS 120 MG PO CP24: 120.0000 mg | ORAL_CAPSULE | Freq: Every day | ORAL | Status: DC

## 2023-04-08 MED ORDER — POLYSACCHARIDE IRON COMPLEX 150 MG PO CAPS: 150.0000 mg | ORAL_CAPSULE | Freq: Every day | ORAL | 0 refills | Status: AC

## 2023-04-08 MED ORDER — FLUTICASONE FUROATE-VILANTEROL 100-25 MCG/ACT IN AEPB: 1.0000 | INHALATION_SPRAY | Freq: Every day | RESPIRATORY_TRACT | 0 refills | Status: AC

## 2023-04-08 MED ORDER — GUAIFENESIN 100 MG/5ML PO LIQD: 5.0000 mL | Freq: Four times a day (QID) | ORAL | 0 refills | Status: DC | PRN

## 2023-04-08 MED ORDER — DILTIAZEM HCL ER COATED BEADS 120 MG PO CP24: 120.0000 mg | ORAL_CAPSULE | Freq: Every day | ORAL | 5 refills | Status: AC

## 2023-04-08 MED ORDER — PREDNISONE 10 MG PO TABS: 10.0000 mg | ORAL_TABLET | Freq: Every day | ORAL | 0 refills | Status: AC

## 2023-04-08 MED ORDER — SOTALOL HCL 80 MG PO TABS: 80.0000 mg | ORAL_TABLET | Freq: Two times a day (BID) | ORAL | 5 refills | Status: AC

## 2023-04-08 MED ORDER — BISACODYL 5 MG PO TBEC: 10.0000 mg | DELAYED_RELEASE_TABLET | Freq: Every day | ORAL | 0 refills | Status: DC | PRN

## 2023-04-08 MED ORDER — APIXABAN 2.5 MG PO TABS: 2.5000 mg | ORAL_TABLET | Freq: Two times a day (BID) | ORAL | 11 refills | Status: DC

## 2023-04-08 NOTE — Progress Notes (Signed)
[facility] CLINIC CARDIOLOGY CONSULT NOTE       Patient ID: Cheyenne Frank MRN: [id number] DOB/AGE: Sep 04, 1941 82 y.o.  Admit date: 04/01/2023 Referring Physician Dr. Gillis Santa  Primary Physician Dr. Laurine Blazer Primary Cardiologist Dr. Darrold Junker  Reason for Consultation AFL RVR   HPI: Cheyenne Frank is an 41yoF with a PMH of paroxymal atrial flutter (eliquis), DM2, HTN, CKD, hx CVA, morbid obesity (BMI 45 kg/m), memory impairment who presented to [facility] ED 04/01/2023 with shortness of breath for with associated dry cough and wheezing.  Hypoxic on admission requiring 3L by [place].  She is being treated for COPD exacerbation and volume overload.  Cardiology is consulted the afternoon of hospital day 3 for assistance with atrial flutter with RVR.  Interval History:  - converted from AF to sinus brady in the mid 50s overnight  - has more energy today  - no chest pain, dyspnea, palpitations, peripheral edema    Review of systems complete and found to be negative unless listed above     Past Medical History:  Diagnosis Date   AKI (acute kidney injury) (HCC)    Anemia of chronic disease    Anxiety    Arthritis    Asthma    no problems since 1999 (after moving into a new home)   Chronic airway obstruction (HCC)    Complication of anesthesia    " feel like I'm dying after I wake up"   Diabetes mellitus without complication (HCC)    Diverticulosis    Dysrhythmia    unknown type    Fibromyalgia    Flat back syndrome    GERD (gastroesophageal reflux disease)    Hx of transfusion 2011   Hypertension    Itching    Macular degeneration    Nocturia    Postoperative anemia due to acute blood loss 04/09/2013   Restless leg syndrome    Sleep apnea    Dx 1990's unable to wear c-pap -   Stroke (HCC) 2010   verbal aphasia x 30 min - resolved - no problem since then    Past Surgical History:  Procedure Laterality Date   ABDOMINAL HYSTERECTOMY     rt so   BACK SURGERY  2010 / 2011    BREAST REDUCTION SURGERY     BURR HOLE FOR SUBDURAL HEMATOMA  2015   cataracts removed     CHOLECYSTECTOMY  1990   DILATION AND CURETTAGE OF UTERUS     HERNIA REPAIR     POSTERIOR LAMINECTOMY / DECOMPRESSION LUMBAR SPINE     with T10-S1 fusion   TONSILLECTOMY     TOTAL KNEE ARTHROPLASTY Left 04/06/2013   Procedure: LEFT TOTAL KNEE ARTHROPLASTY;  Surgeon: Loanne Drilling, MD;  Location: [facility] ORS;  Service: Orthopedics;  Laterality: Left;    Medications Prior to Admission  Medication Sig Dispense Refill Last Dose   acetaminophen (TYLENOL) 500 MG tablet Take 500-1,000 mg by mouth every 6 (six) hours as needed for mild pain or fever.    prn at unk   albuterol (VENTOLIN HFA) 108 (90 Base) MCG/ACT inhaler Inhale 2 puffs into the lungs every 6 (six) hours as needed for wheezing or shortness of breath.   04/01/2023 at 0200   apixaban (ELIQUIS) 2.5 MG TABS tablet Take 1 tablet (2.5 mg total) by mouth 2 (two) times daily. 60 tablet 0 03/31/2023 at pm   diltiazem (CARDIZEM CD) 180 MG 24 hr capsule Take 1 capsule (180 mg total) by mouth daily.  1 capsule 0 03/31/2023   gabapentin (NEURONTIN) 300 MG capsule Take 1 capsule (300 mg total) by mouth 2 (two) times daily. 6 capsule 0 03/31/2023   guaiFENesin (ROBITUSSIN) 100 MG/5ML liquid Take 5 mLs by mouth every 4 (four) hours as needed for cough or to loosen phlegm. 120 mL 0 03/31/2023   insulin glargine (LANTUS) 100 UNIT/ML injection Inject 0.64 mLs (64 Units total) into the skin at bedtime.   03/31/2023   insulin lispro (HUMALOG KWIKPEN) 100 UNIT/ML KwikPen Inject 22 Units into the skin 3 (three) times daily.   03/31/2023   Multiple Vitamin (MULTIVITAMIN WITH MINERALS) TABS tablet Take 1 tablet by mouth daily.   03/31/2023   Multiple Vitamins-Minerals (ICAPS AREDS 2 PO) Take 1 capsule by mouth in the morning and at bedtime.   03/31/2023   omeprazole (PRILOSEC) 40 MG capsule Take 40 mg by mouth daily.    03/31/2023   oxybutynin (DITROPAN-XL) 10 MG 24 hr tablet Take 10  mg by mouth at bedtime.   03/31/2023   PARoxetine (PAXIL) 40 MG tablet Take 1 tablet (40 mg total) by mouth at bedtime. 6 tablet 0 03/31/2023   rosuvastatin (CRESTOR) 5 MG tablet Take 2.5 mg by mouth at bedtime.   03/31/2023   sitaGLIPtin (JANUVIA) 50 MG tablet Take 50 mg by mouth daily.   03/31/2023   torsemide (DEMADEX) 20 MG tablet Take 1 tablet (20 mg total) by mouth daily. Please start from 11/01/2021 90 tablet 1 03/31/2023   traZODone (DESYREL) 50 MG tablet Take 1.5 tablets (75 mg total) by mouth at bedtime.   03/31/2023   insulin lispro (HUMALOG) 100 UNIT/ML injection Inject 0.05 mLs (5 Units total) into the skin 3 (three) times daily with meals. 10 mL 11    metoprolol tartrate (LOPRESSOR) 25 MG tablet Take 1 tablet (25 mg total) by mouth 2 (two) times daily. 60 tablet 0    Social History   Socioeconomic History   Marital status: Single    Spouse name: Not on file   Number of children: Not on file   Years of education: Not on file   Highest education level: Not on file  Occupational History   Not on file  Tobacco Use   Smoking status: Never   Smokeless tobacco: Never  Substance and Sexual Activity   Alcohol use: No    Alcohol/week: 0.0 standard drinks of alcohol   Drug use: No   Sexual activity: Not Currently  Other Topics Concern   Not on file  Social History Narrative   Not on file   Social Determinants of Health   Financial Resource Strain: Low Risk  (07/08/2017)   Received from [facility], [facility]   Overall Financial Resource Strain (CARDIA)    Difficulty of Paying Living Expenses: Not hard at all  Food Insecurity: Patient Declined (04/01/2023)   Hunger Vital Sign    Worried About Running Out of Food in the Last Year: Patient declined    Ran Out of Food in the Last Year: Patient declined  Transportation Needs: Patient Declined (04/01/2023)   PRAPARE - Administrator, Civil Service (Medical): Patient declined    Lack of  Transportation (Non-Medical): Patient declined  Physical Activity: Inactive (07/08/2017)   Received from [facility], [facility]   Exercise Vital Sign    Days of Exercise per Week: 0 days    Minutes of Exercise per Session: 0 min  Stress: Not on  file  Social Connections: Unknown (07/08/2017)   Received from [facility], [facility]   Social Connection and Isolation Panel [NHANES]    Frequency of Communication with Friends and Family: More than three times a week    Frequency of Social Gatherings with Friends and Family: More than three times a week    Attends Religious Services: Not on file    Active Member of Clubs or Organizations: No    Attends Banker Meetings: Never    Marital Status: Widowed  Intimate Partner Violence: Patient Declined (04/01/2023)   Humiliation, Afraid, Rape, and Kick questionnaire    Fear of Current or Ex-Partner: Patient declined    Emotionally Abused: Patient declined    Physically Abused: Patient declined    Sexually Abused: Patient declined    Family History  Problem Relation Age of Onset   COPD Sister        had lung transplant   COPD Brother       Intake/Output Summary (Last 24 hours) at 04/08/2023 0910 Last data filed at 04/08/2023 0405 Gross per 24 hour  Intake 480 ml  Output 2250 ml  Net -1770 ml    Vitals:   04/07/23 2347 04/08/23 0029 04/08/23 0418 04/08/23 0824  BP: 117/73  139/65 (!) 130/59  Pulse: (!) 125 62 (!) 54 (!) 53  Resp: 15 17 18 16   Temp: 98.4 F (36.9 C)  97.8 F (36.6 C) 98.3 F (36.8 C)  TempSrc: Oral  Oral   SpO2: 98% 100% 96% 96%  Weight:      Height:        PHYSICAL EXAM General: Pleasant elderly Caucasian female, well nourished, in no acute distress.  Sitting upright in bed with caretaker present in room and daughter by phone HEENT:  Normocephalic and atraumatic. Neck:  No JVD.  Lungs: Normal respiratory effort on 3L by [place].  Trace wheezing on the right, no crackles heart: brady but regular. Normal S1 and S2 without gallops or murmurs.  Abdomen: Non-distended appearing with excess adiposity.  Msk: Normal strength and tone for age. Extremities: Warm and well perfused. No clubbing, cyanosis.  no lower extremity edema.  Chronic appearing brawny hyperpigmentation of both lower legs Neuro: Alert and oriented X 3. Psych:  Answers questions appropriately.   Labs: Basic Metabolic Panel: Recent Labs    04/07/23 0538 04/08/23 0546  NA 140 143  K 4.6 4.8  CL 100 98  CO2 36* 37*  GLUCOSE 243* 117*  BUN 74* 79*  CREATININE 1.58* 1.62*  CALCIUM 8.8* 8.9  MG 2.5* 2.4  PHOS 3.5 3.8   Liver Function Tests: No results for input(s): "AST", "ALT", "ALKPHOS", "BILITOT", "PROT", "ALBUMIN" in the last 72 hours. No results for input(s): "LIPASE", "AMYLASE" in the last 72 hours. CBC: Recent Labs    04/07/23 0538 04/08/23 0546  WBC 9.7 10.9*  HGB 11.7* 12.3  HCT 37.6 40.0  MCV 92.8 92.2  PLT 130* 158   Cardiac Enzymes: No results for input(s): "CKTOTAL", "CKMB", "CKMBINDEX", "TROPONINIHS" in the last 72 hours.  BNP: No results for input(s): "BNP" in the last 72 hours. D-Dimer: No results for input(s): "DDIMER" in the last 72 hours. Hemoglobin A1C: No results for input(s): "HGBA1C" in the last 72 hours. Fasting Lipid Panel: No results for input(s): "CHOL", "HDL", "LDLCALC", "TRIG", "CHOLHDL", "LDLDIRECT" in the last 72 hours. Thyroid Function Tests: No results for input(s): "TSH", "T4TOTAL", "T3FREE", "THYROIDAB" in the last 72 hours.  Invalid input(s): "  FREET3" Anemia Panel: No results for input(s): "VITAMINB12", "FOLATE", "FERRITIN", "TIBC", "IRON", "RETICCTPCT" in the last 72 hours.    Radiology: ECHOCARDIOGRAM COMPLETE  Result Date: 04/03/2023    ECHOCARDIOGRAM REPORT   Patient Name:   GERARD BONUS Date of Exam: 04/03/2023 Medical Rec #:  [id number]       Height:       62.0 in Accession #:     [id number]      Weight:       377.9 lb Date of Birth:  01-18-1941        BSA:          2.505 m Patient Age:    82 years        BP:           121/83 mmHg Patient Gender: F               HR:           132 bpm. Exam Location:  [facility] Procedure: 2D Echo, Cardiac Doppler and Color Doppler Indications:     CHF  History:         Patient has prior history of Echocardiogram examinations, most                  recent 09/10/2022. CHF, Stroke, TIA and COPD, Arrythmias:Atrial                  Fibrillation and Atrial Flutter, Signs/Symptoms:Chest Pain;                  Risk Factors:Hypertension, Diabetes, Dyslipidemia and Sleep                  Apnea. CKD.  Sonographer:     Mikki Harbor Referring Phys:  [id number] Gillis Santa Diagnosing Phys: Debbe Odea MD  Sonographer Comments: Technically difficult study due to poor echo windows and patient is obese. IMPRESSIONS  1. Left ventricular ejection fraction, by estimation, is 60 to 65%. The left ventricle has normal function. The left ventricle has no regional wall motion abnormalities. There is mild left ventricular hypertrophy. Indeterminate diastolic filling due to E-A fusion.  2. Right ventricular systolic function is normal. The right ventricular size is normal. There is normal pulmonary artery systolic pressure.  3. The mitral valve is degenerative. Mild mitral valve regurgitation.  4. The aortic valve is grossly normal. Aortic valve regurgitation is not visualized.  5. The inferior vena cava is normal in size with greater than 50% respiratory variability, suggesting right atrial pressure of 3 mmHg. FINDINGS  Left Ventricle: Left ventricular ejection fraction, by estimation, is 60 to 65%. The left ventricle has normal function. The left ventricle has no regional wall motion abnormalities. The left ventricular internal cavity size was normal in size. There is  mild left ventricular hypertrophy. Indeterminate diastolic filling due to E-A fusion. Right Ventricle: The right  ventricular size is normal. No increase in right ventricular wall thickness. Right ventricular systolic function is normal. There is normal pulmonary artery systolic pressure. The tricuspid regurgitant velocity is 2.55 m/s, and  with an assumed right atrial pressure of 8 mmHg, the estimated right ventricular systolic pressure is 34.0 mmHg. Left Atrium: Left atrial size was normal in size. Right Atrium: Right atrial size was normal in size. Pericardium: There is no evidence of pericardial effusion. Mitral Valve: The mitral valve is degenerative in appearance. Mild to moderate mitral annular calcification. Mild mitral valve regurgitation. MV peak gradient, 10.2 mmHg. The mean mitral  valve gradient is 4.0 mmHg. Tricuspid Valve: The tricuspid valve is normal in structure. Tricuspid valve regurgitation is not demonstrated. Aortic Valve: The aortic valve is grossly normal. Aortic valve regurgitation is not visualized. Aortic valve mean gradient measures 3.0 mmHg. Aortic valve peak gradient measures 6.1 mmHg. Aortic valve area, by VTI measures 2.31 cm. Pulmonic Valve: The pulmonic valve was not well visualized. Pulmonic valve regurgitation is not visualized. Aorta: The aortic root is normal in size and structure. Venous: The inferior vena cava is normal in size with greater than 50% respiratory variability, suggesting right atrial pressure of 3 mmHg. IAS/Shunts: No atrial level shunt detected by color flow Doppler.  LEFT VENTRICLE PLAX 2D LVIDd:         4.20 cm LVIDs:         2.30 cm LV PW:         1.60 cm LV IVS:        1.50 cm LVOT diam:     2.00 cm LV SV:         47 LV SV Index:   19 LVOT Area:     3.14 cm  RIGHT VENTRICLE RV Basal diam:  3.40 cm RV Mid diam:    3.70 cm LEFT ATRIUM             Index        RIGHT ATRIUM           Index LA diam:        4.10 cm 1.64 cm/m   RA Area:     19.00 cm LA Vol (A2C):   53.7 ml 21.44 ml/m  RA Volume:   52.50 ml  20.96 ml/m LA Vol (A4C):   80.5 ml 32.13 ml/m LA Biplane Vol:  66.7 ml 26.62 ml/m  AORTIC VALVE                    PULMONIC VALVE AV Area (Vmax):    2.41 cm     PV Vmax:       1.11 m/s AV Area (Vmean):   2.55 cm     PV Peak grad:  4.9 mmHg AV Area (VTI):     2.31 cm AV Vmax:           123.00 cm/s AV Vmean:          73.650 cm/s AV VTI:            0.206 m AV Peak Grad:      6.1 mmHg AV Mean Grad:      3.0 mmHg LVOT Vmax:         94.20 cm/s LVOT Vmean:        59.700 cm/s LVOT VTI:          0.151 m LVOT/AV VTI ratio: 0.73  AORTA Ao Root diam: 3.70 cm MITRAL VALVE                TRICUSPID VALVE MV Area (PHT): 5.34 cm     TR Peak grad:   26.0 mmHg MV Area VTI:   2.31 cm     TR Vmax:        255.00 cm/s MV Peak grad:  10.2 mmHg MV Mean grad:  4.0 mmHg     SHUNTS MV Vmax:       1.60 m/s     Systemic VTI:  0.15 m MV Vmean:      89.4 cm/s    Systemic Diam: 2.00 cm MV Decel Time:  142 msec MV E velocity: 143.00 cm/s Debbe Odea MD Electronically signed by Debbe Odea MD Signature Date/Time: 04/03/2023/5:14:58 PM    Final    US RENAL  Result Date: 04/02/2023 CLINICAL DATA:  Acute kidney injury EXAM: RENAL / URINARY TRACT ULTRASOUND COMPLETE COMPARISON:  CT 07/11/2020 FINDINGS: Right Kidney: Renal measurements: 10.1 x 5.2 x 5.3 cm = volume: 145.9 mL. Echogenicity within normal limits. No hydronephrosis. No suspicious mass Left Kidney: Renal measurements: 11.4 x 6 x 4 cm = volume: 216.2 mL. Echogenicity within normal limits. No suspicious mass or hydronephrosis. Bladder: Appears normal for degree of bladder distention. Other: Incidental pleural effusions. IMPRESSION: Negative for hydronephrosis Electronically Signed   By: Jasmine Pang M.D.   On: 04/02/2023 15:41   DG Chest Portable 1 View  Result Date: 04/01/2023 CLINICAL DATA:  82 year old female with shortness of breath. Undergoing breathing treatment. EXAM: PORTABLE CHEST 1 VIEW COMPARISON:  Portable chest 02/23/2023. Chest CTA 02/19/2023 and earlier. FINDINGS: Portable AP upright view at 0356 hours. Left lung  veiling opacity has mildly increased since last month. Bilateral layering pleural effusions demonstrated on CTA at that time and pleural effusion on the right also likely persists, with patchy associated opacity most resembling atelectasis. Stable mild cardiomegaly and other mediastinal contours. Mildly irregular tracheal air column appears stable from previous exams and was atelectatic on the CTA last month. No superimposed pneumothorax. Pulmonary vascularity does appear mildly increased. No air bronchograms. Paucity of bowel gas. Chronic lower spinal fusion hardware. IMPRESSION: 1. Bilateral pleural effusions, small but that on the left appears increased since last month. Bilateral lung base atelectasis suspected. 2. Cardiomegaly with increased pulmonary vascularity. Cannot exclude mild or developing interstitial edema. 3. Question underlying Tracheomalacia. Electronically Signed   By: Odessa Fleming M.D.   On: 04/01/2023 04:07     TELEMETRY reviewed by me (LT) 04/08/2023 : Atrial flutter  100s to 110s  EKG reviewed by me: 7/15, NSR rate 86 with LVH  Data reviewed by me (LT) 04/08/2023: Hospitalist progress note, nursing notes last 24h vitals tele labs imaging I/O   Principal Problem:   COPD exacerbation (HCC) Active Problems:   HTN (hypertension)   Obesity, Class III, BMI 40-49.9 (morbid obesity) (HCC)   Stage 3b chronic kidney disease (CKD) (HCC)   Acute on chronic diastolic CHF (congestive heart failure) (HCC)   Stroke (HCC)   Atrial fibrillation with rapid ventricular response (HCC)   HLD (hyperlipidemia)   Type II diabetes mellitus with renal manifestations (HCC)    ASSESSMENT AND PLAN:  Cheyenne Frank is an 51yoF with a PMH of paroxymal atrial flutter (eliquis), DM2, HTN, CKD, hx CVA, morbid obesity (BMI 45 kg/m), memory impairment who presented to [facility] ED 04/01/2023 with shortness of breath for with associated dry cough and wheezing.  Hypoxic on admission requiring 3L by [place].  She is being  treated for COPD exacerbation and volume overload.  Cardiology is consulted the afternoon of hospital day 3 for assistance with atrial flutter with RVR.  # COPD exacerbation # Acute hypoxic respiratory failure On steroids, DuoNebs, and Breo inhalers per primary  # Paroxysmal atrial flutter, converted to NSR 7/22 early AM Converted to sinus brady/NSR with rates in the 50s-60s -s/p diltiazem infusion  -consolidate diltiazem to CD 120mg  daily - continue sotalol 80mg  BID  -stop metoprolol  -Continue Eliquis 2.5 mg twice daily for stroke prevention  # Acute on chronic HFpEF Clinically euvolemic. s/p multiple days of IV Lasix with good diuresis. - continue PO torsemide  20mg  daily   Ok for discharge today from a cardiac perspective. Will arrange for follow up in clinic with Dr. Darrold Junker in 1-2 weeks.  .   This patient's plan of care was discussed and created with Dr. Juliann Pares and he is in agreement.  Signed: Rebeca Allegra , PA-C 04/08/2023, 9:10 AM [facility]

## 2023-04-08 NOTE — Discharge Summary (Signed)
[facility] Discharge Summary   Patient: Cheyenne Frank ZOX:[id number]  PCP: [facility]  Date of admission: 04/01/2023   Date of discharge:  04/08/2023     Discharge Diagnoses:  Principal Problem:   COPD exacerbation (HCC) Active Problems:   Acute on chronic diastolic CHF (congestive heart failure) (HCC)   Atrial fibrillation with rapid ventricular response (HCC)   HTN (hypertension)   Stroke (HCC)   Stage 3b chronic kidney disease (CKD) (HCC)   HLD (hyperlipidemia)   Obesity, Class III, BMI 40-49.9 (morbid obesity) (HCC)   Type II diabetes mellitus with renal manifestations (HCC)   Admitted From: Home Disposition:  Home with [facility] PT  Recommendations for Outpatient Follow-up:  F/u with PCP in 1 wk, monitor BP and CBG at home.  F/u with Cardiologist in 1-2 weeks  May f/u with Pulmo for PFTs Follow up LABS/TEST:     Follow-up Information     Paraschos, Lyn Hollingshead, MD Follow up.   Specialty: Cardiology Contact information: [address] [facility] [place] [address] [phone]         [place] [place] [facility] Follow up in 1 week(s).   Specialty: Family Medicine Contact information: [address] [place] [address] [phone]                Diet recommendation: Cardiac and Carb modified diet  Activity: The patient is advised to gradually reintroduce usual activities, as tolerated  Discharge Condition: stable  Code Status: DNR   History of present illness: As per the H and P dictated on admission Hospital Course:  Cheyenne Frank is a 82 y.o. female with medical history significant of HTN, JHLD, DM,  COPD not on O2, dCHF, stroke, dementia, depression, CKD-4, morbid obesity, atrial fibrillation on Eliquis, subdural hematoma, RLS, fibromyalgia, who presents with shortness of breath.   Per her daughter at the bedside, patient has shortness of breath for more than 2 days,  which has been progressively worsening.  He has dry cough, no chest pain, fever or chills.  Patient has wheezing.  Normally patient is not using oxygen at home, but was found to have oxygen desaturation to 87% on room air, which improved to 100% on 3 L oxygen.  Patient does not have nausea, vomiting, diarrhea or abdominal pain.  No symptoms of UTI.     Data reviewed independently and ED Course: pt was found to have negative COVID PCR, WBC 9.8, BNP 524, stable renal function, temperature normal, blood pressure 137/50, heart rate 127, 78, RR 22.  VBG with pH 7.32, CO2 57, O2 88.  Patient is admitted to telemetry bed as inpatient.   Chest x-ray: 1. Bilateral pleural effusions, small but that on the left appears increased since last month. Bilateral lung base atelectasis suspected. 2. Cardiomegaly with increased pulmonary vascularity. Cannot exclude mild or developing interstitial edema. 3. Question underlying Tracheomalacia.   EKG: Sinus rhythm, QTc 459, LAE, LAD, T wave inversion in the lateral leads and V5-V6.   Assessment and Plan: # Atrial fibrillation with rapid ventricular response. Resolved. Converted to SR Continue Eliquis and Cardizem. S/p IV Lopressor prn.  7/18 last night patient developed atrial flutter, Cardizem IV infusion was started. 7/19 increased Cardizem 90 mg p.o. every 6 hourly as per cardiology 7/19 Metoprolol 50 mg p.o. BID. S/p Cardizem IV infusion, gradually weaned off Cardiology consult appreciated. 7/21 patient had A-fib with RVR on 7/20 in the evening, again Cardizem infusion order was placed  but cardiology discontinued and started on sotalol 80 mg p.o. twice daily, increased Lopressor 50 mg p.o. 3 times daily, continued Cardizem 90 every 6 hourly.  7/22 pt converted to SR and cardiology dc'd BB, started Cardizem CD 120 mg, continued Sotalol 80 mg po bid, cleared to discharge and f/u out patient.   # COPD exacerbation: Patient has cough, shortness of breath, wheezing,  consistent with COPD exacerbation.  Patient has a 3 L new oxygen requirement.  No fever or leukocytosis clinically does not seem to have pneumonia. S/p azithromycin IV given in the ED, s/p azithromycin 250 mg p.o. daily x 4 days S/p IV Solu-Medrol, started tapering dose of oral prednisone. D/c'd on Prednisone 10 mg pod x 3 days. S/p DuoNeb every 6 hourly prn and Breo Ellipta inhaler, continued on discharge. S/p Mucinex 600 mg p.o. twice daily, and Mucinex DM as needed for cough. Resumed mucinex prn on discharge. Continue supplemental O2 admission and incentive spirometry. Oxygen was arranged for home use and recommended to f/u Pulmo for PFTs    # Acute on chronic diastolic CHF (congestive heart failure) (HCC): 2D echo on 09/10/2022 showed EF of 70-75% with grade 2 diastolic dysfunction.  Patient has 1+ leg edema, elevated BNP 524, clinically consistent with CHF exacerbation. -s/p Lasix 40 mg IV bid, decrease Lasix 20 mg p.o. daily, transition to torsemide 20 mh pod on 7/20. Continue Low salt diet, Fluid restriction 1.5 L per day.  # Hyperkalemia, Resolved  7/16 s/p Lokelma 10 g every 8 hourly x 2 doses but no BM 7/16 Lokelma 10 mg x 2 doses ordered. Continue Low potassium diet # HTN (hypertension), continue Cardizem CD120 and started sotalol. Monitor BP at home and f/u with pcp to titrate medications accordingly # h/o Stroke: Continue Crestor and Eliquis due to A/fib # Stage 3b chronic kidney disease (CKD): Close to recent baseline creatinine.  Recent baseline creatinine 2.47 on 02/23/2023.  creatinine 2.12--2.25 --2.35---1.62 improved # HLD (hyperlipidemia) on Crestor  # Diabetes mellitus with renal complications: Recent A1c 10.2, poorly controlled.  Patient taking NovoLog, Januvia, Lantus 64 units daily. S/p Sliding scale insulin and on 7/17 increased Lantus 40 units BID, Hyperglycemia secondary to steroids. Resumed home dose on discharge. continue diabetic diet, monitor CBG # Iron deficiency anemia,  transferrin saturation 8%, s/p Venofer 200 mg IV x 5 doses. followed by oral supplement. Folic acid level within normal range # Morbid Obesity, hypoventilation syndrome  Body mass index is 45.21 kg/m.  Nutrition Interventions: Encourage losing weight and Exercise and healthy diet  Patient was seen by physical therapy, and TOC was following, pt was active with home care which was resumed. On the day of the discharge the patient's vitals were stable, and no other acute medical condition were reported by patient. the patient was felt safe to be discharge at Home with Home health PT.  Consultants: Cardiologist Procedures: None  Discharge Exam: General: Appear in no distress, no Rash; Oral Mucosa Clear, moist. Cardiovascular: S1 and S2 Present, no Murmur, Respiratory: normal respiratory effort, Bilateral Air entry present and no Crackles, no wheezes Abdomen: Bowel Sound present, Soft and no tenderness, no hernia Extremities: no Pedal edema, no calf tenderness Neurology: alert and oriented to time, place, and person affect appropriate.  Filed Weights   04/05/23 0629 04/06/23 0459 04/07/23 0419  Weight: 116.4 kg 115.6 kg 115.8 kg   Vitals:   04/08/23 0824 04/08/23 1203  BP: (!) 130/59 (!) 154/64  Pulse: (!) 53 61  Resp: 16 16  Temp: 98.3 F (36.8 C) 97.7 F (36.5 C)  SpO2: 96% 99%    DISCHARGE MEDICATION: Allergies as of 04/08/2023       Reactions   Codeine Palpitations   Other Reaction: RACES HEART   Dilaudid [hydromorphone] Other (See Comments), Rash   Other Reaction: lightheaded, altered ms, nervous, Paranoia paranoid Other Reaction: lightheaded, altered ms, nervous, Paranoia   Demerol [meperidine] Other (See Comments), Rash   agitated Agitation   Pravastatin Other (See Comments)   Muscle aches   Amiodarone Other (See Comments)   Per family pt had twitching while taking and cardiologist took pt off    Keppra [levetiracetam]    Lipitor [atorvastatin] Other (See  Comments), Itching   unknown   Nifedipine Other (See Comments)   Unknown - tolerates diltiazem   Statins Rash   Makes her crazy   Sulfa Antibiotics Rash   Vancomycin Rash   deafness        Medication List     STOP taking these medications    metoprolol tartrate 25 MG tablet Commonly known as: LOPRESSOR       TAKE these medications    acetaminophen 500 MG tablet Commonly known as: TYLENOL Take 500-1,000 mg by mouth every 6 (six) hours as needed for mild pain or fever.   albuterol 108 (90 Base) MCG/ACT inhaler Commonly known as: VENTOLIN HFA Inhale 2 puffs into the lungs every 6 (six) hours as needed for wheezing or shortness of breath.   apixaban 2.5 MG Tabs tablet Commonly known as: ELIQUIS Take 1 tablet (2.5 mg total) by mouth 2 (two) times daily.   bisacodyl 5 MG EC tablet Commonly known as: DULCOLAX Take 2 tablets (10 mg total) by mouth daily as needed for moderate constipation.   diltiazem 120 MG 24 hr capsule Commonly known as: CARDIZEM CD Take 1 capsule (120 mg total) by mouth daily. What changed:  medication strength how much to take   fluticasone furoate-vilanterol 100-25 MCG/ACT Aepb Commonly known as: Breo Ellipta Inhale 1 puff into the lungs daily.   gabapentin 300 MG capsule Commonly known as: NEURONTIN Take 1 capsule (300 mg total) by mouth 2 (two) times daily.   guaiFENesin 100 MG/5ML liquid Commonly known as: ROBITUSSIN Take 5 mLs by mouth every 6 (six) hours as needed for cough or to loosen phlegm. What changed: when to take this   ICAPS AREDS 2 PO Take 1 capsule by mouth in the morning and at bedtime.   insulin glargine 100 UNIT/ML injection Commonly known as: LANTUS Inject 0.64 mLs (64 Units total) into the skin at bedtime.   insulin lispro 100 UNIT/ML injection Commonly known as: HumaLOG Inject 0.05 mLs (5 Units total) into the skin 3 (three) times daily with meals.   HumaLOG KwikPen 100 UNIT/ML KwikPen Generic drug: insulin  lispro Inject 22 Units into the skin 3 (three) times daily.   iron polysaccharides 150 MG capsule Commonly known as: NIFEREX Take 1 capsule (150 mg total) by mouth daily. Start taking on: April 09, 2023   multivitamin with minerals Tabs tablet Take 1 tablet by mouth daily.   omeprazole 40 MG capsule Commonly known as: PRILOSEC Take 40 mg by mouth daily.   oxybutynin 10 MG 24 hr tablet Commonly known as: DITROPAN-XL Take 10 mg by mouth at bedtime.   PARoxetine 40 MG tablet Commonly known as: PAXIL Take 1 tablet (40 mg total) by mouth at bedtime.   predniSONE 10 MG tablet Commonly known as: DELTASONE Take 1 tablet (  10 mg total) by mouth daily with breakfast for 3 days. Start taking on: April 09, 2023   rosuvastatin 5 MG tablet Commonly known as: CRESTOR Take 2.5 mg by mouth at bedtime.   sitaGLIPtin 50 MG tablet Commonly known as: JANUVIA Take 50 mg by mouth daily.   sotalol 80 MG tablet Commonly known as: BETAPACE Take 1 tablet (80 mg total) by mouth every 12 (twelve) hours.   torsemide 20 MG tablet Commonly known as: DEMADEX Take 1 tablet (20 mg total) by mouth daily. Please start from 11/01/2021   traZODone 50 MG tablet Commonly known as: DESYREL Take 1.5 tablets (75 mg total) by mouth at bedtime.               Durable Medical Equipment  (From admission, onward)           Start     Ordered   04/06/23 0920  For home use only DME oxygen  Once       Question Answer Comment  Length of Need 6 Months   Mode or (Route) Nasal cannula   Liters per Minute 2   Frequency Continuous (stationary and portable oxygen unit needed)   Oxygen conserving device Yes   Oxygen delivery system Gas      04/06/23 0920           Allergies  Allergen Reactions   Codeine Palpitations    Other Reaction: RACES HEART   Dilaudid [Hydromorphone] Other (See Comments) and Rash    Other Reaction: lightheaded, altered ms, nervous, Paranoia paranoid Other Reaction:  lightheaded, altered ms, nervous, Paranoia   Demerol [Meperidine] Other (See Comments) and Rash    agitated Agitation   Pravastatin Other (See Comments)    Muscle aches   Amiodarone Other (See Comments)    Per family pt had twitching while taking and cardiologist took pt off    Keppra [Levetiracetam]    Lipitor [Atorvastatin] Other (See Comments) and Itching    unknown   Nifedipine Other (See Comments)    Unknown - tolerates diltiazem   Statins Rash    Makes her crazy   Sulfa Antibiotics Rash   Vancomycin Rash    deafness   Discharge Instructions     Call MD for:  difficulty breathing, headache or visual disturbances   Complete by: As directed    Call MD for:  extreme fatigue   Complete by: As directed    Call MD for:  persistant dizziness or light-headedness   Complete by: As directed    Call MD for:  persistant nausea and vomiting   Complete by: As directed    Call MD for:  severe uncontrolled pain   Complete by: As directed    Call MD for:  temperature >100.4   Complete by: As directed    Diet - low sodium heart healthy   Complete by: As directed    Diet Carb Modified   Complete by: As directed    Discharge instructions   Complete by: As directed    F/u with PCP in 1 wk, monitor BP and CBG at home.  F/u with Cardiologist in 1-2 weeks  May f/u with Pulmo for PFTs   Increase activity slowly   Complete by: As directed        The results of significant diagnostics from this hospitalization (including imaging, microbiology, ancillary and laboratory) are listed below for reference.    Significant Diagnostic Studies: ECHOCARDIOGRAM COMPLETE  Result Date: 04/03/2023    ECHOCARDIOGRAM  REPORT   Patient Name:   OLUWATOBI RUPPE Date of Exam: 04/03/2023 Medical Rec #:  [id number]       Height:       62.0 in Accession #:    [id number]      Weight:       377.9 lb Date of Birth:  24-Nov-1940        BSA:          2.505 m Patient Age:    82 years        BP:           121/83 mmHg  Patient Gender: F               HR:           132 bpm. Exam Location:  [facility] Procedure: 2D Echo, Cardiac Doppler and Color Doppler Indications:     CHF  History:         Patient has prior history of Echocardiogram examinations, most                  recent 09/10/2022. CHF, Stroke, TIA and COPD, Arrythmias:Atrial                  Fibrillation and Atrial Flutter, Signs/Symptoms:Chest Pain;                  Risk Factors:Hypertension, Diabetes, Dyslipidemia and Sleep                  Apnea. CKD.  Sonographer:     Mikki Harbor Referring Phys:  [id number] Gillis Santa Diagnosing Phys: Debbe Odea MD  Sonographer Comments: Technically difficult study due to poor echo windows and patient is obese. IMPRESSIONS  1. Left ventricular ejection fraction, by estimation, is 60 to 65%. The left ventricle has normal function. The left ventricle has no regional wall motion abnormalities. There is mild left ventricular hypertrophy. Indeterminate diastolic filling due to E-A fusion.  2. Right ventricular systolic function is normal. The right ventricular size is normal. There is normal pulmonary artery systolic pressure.  3. The mitral valve is degenerative. Mild mitral valve regurgitation.  4. The aortic valve is grossly normal. Aortic valve regurgitation is not visualized.  5. The inferior vena cava is normal in size with greater than 50% respiratory variability, suggesting right atrial pressure of 3 mmHg. FINDINGS  Left Ventricle: Left ventricular ejection fraction, by estimation, is 60 to 65%. The left ventricle has normal function. The left ventricle has no regional wall motion abnormalities. The left ventricular internal cavity size was normal in size. There is  mild left ventricular hypertrophy. Indeterminate diastolic filling due to E-A fusion. Right Ventricle: The right ventricular size is normal. No increase in right ventricular wall thickness. Right ventricular systolic function is normal. There is normal pulmonary  artery systolic pressure. The tricuspid regurgitant velocity is 2.55 m/s, and  with an assumed right atrial pressure of 8 mmHg, the estimated right ventricular systolic pressure is 34.0 mmHg. Left Atrium: Left atrial size was normal in size. Right Atrium: Right atrial size was normal in size. Pericardium: There is no evidence of pericardial effusion. Mitral Valve: The mitral valve is degenerative in appearance. Mild to moderate mitral annular calcification. Mild mitral valve regurgitation. MV peak gradient, 10.2 mmHg. The mean mitral valve gradient is 4.0 mmHg. Tricuspid Valve: The tricuspid valve is normal in structure. Tricuspid valve regurgitation is not demonstrated. Aortic Valve: The aortic valve is grossly normal. Aortic valve regurgitation is  not visualized. Aortic valve mean gradient measures 3.0 mmHg. Aortic valve peak gradient measures 6.1 mmHg. Aortic valve area, by VTI measures 2.31 cm. Pulmonic Valve: The pulmonic valve was not well visualized. Pulmonic valve regurgitation is not visualized. Aorta: The aortic root is normal in size and structure. Venous: The inferior vena cava is normal in size with greater than 50% respiratory variability, suggesting right atrial pressure of 3 mmHg. IAS/Shunts: No atrial level shunt detected by color flow Doppler.  LEFT VENTRICLE PLAX 2D LVIDd:         4.20 cm LVIDs:         2.30 cm LV PW:         1.60 cm LV IVS:        1.50 cm LVOT diam:     2.00 cm LV SV:         47 LV SV Index:   19 LVOT Area:     3.14 cm  RIGHT VENTRICLE RV Basal diam:  3.40 cm RV Mid diam:    3.70 cm LEFT ATRIUM             Index        RIGHT ATRIUM           Index LA diam:        4.10 cm 1.64 cm/m   RA Area:     19.00 cm LA Vol (A2C):   53.7 ml 21.44 ml/m  RA Volume:   52.50 ml  20.96 ml/m LA Vol (A4C):   80.5 ml 32.13 ml/m LA Biplane Vol: 66.7 ml 26.62 ml/m  AORTIC VALVE                    PULMONIC VALVE AV Area (Vmax):    2.41 cm     PV Vmax:       1.11 m/s AV Area (Vmean):   2.55 cm      PV Peak grad:  4.9 mmHg AV Area (VTI):     2.31 cm AV Vmax:           123.00 cm/s AV Vmean:          73.650 cm/s AV VTI:            0.206 m AV Peak Grad:      6.1 mmHg AV Mean Grad:      3.0 mmHg LVOT Vmax:         94.20 cm/s LVOT Vmean:        59.700 cm/s LVOT VTI:          0.151 m LVOT/AV VTI ratio: 0.73  AORTA Ao Root diam: 3.70 cm MITRAL VALVE                TRICUSPID VALVE MV Area (PHT): 5.34 cm     TR Peak grad:   26.0 mmHg MV Area VTI:   2.31 cm     TR Vmax:        255.00 cm/s MV Peak grad:  10.2 mmHg MV Mean grad:  4.0 mmHg     SHUNTS MV Vmax:       1.60 m/s     Systemic VTI:  0.15 m MV Vmean:      89.4 cm/s    Systemic Diam: 2.00 cm MV Decel Time: 142 msec MV E velocity: 143.00 cm/s Debbe Odea MD Electronically signed by Debbe Odea MD Signature Date/Time: 04/03/2023/5:14:58 PM    Final    US RENAL  Result Date:  04/02/2023 CLINICAL DATA:  Acute kidney injury EXAM: RENAL / URINARY TRACT ULTRASOUND COMPLETE COMPARISON:  CT 07/11/2020 FINDINGS: Right Kidney: Renal measurements: 10.1 x 5.2 x 5.3 cm = volume: 145.9 mL. Echogenicity within normal limits. No hydronephrosis. No suspicious mass Left Kidney: Renal measurements: 11.4 x 6 x 4 cm = volume: 216.2 mL. Echogenicity within normal limits. No suspicious mass or hydronephrosis. Bladder: Appears normal for degree of bladder distention. Other: Incidental pleural effusions. IMPRESSION: Negative for hydronephrosis Electronically Signed   By: Jasmine Pang M.D.   On: 04/02/2023 15:41   DG Chest Portable 1 View  Result Date: 04/01/2023 CLINICAL DATA:  82 year old female with shortness of breath. Undergoing breathing treatment. EXAM: PORTABLE CHEST 1 VIEW COMPARISON:  Portable chest 02/23/2023. Chest CTA 02/19/2023 and earlier. FINDINGS: Portable AP upright view at 0356 hours. Left lung veiling opacity has mildly increased since last month. Bilateral layering pleural effusions demonstrated on CTA at that time and pleural effusion on the  right also likely persists, with patchy associated opacity most resembling atelectasis. Stable mild cardiomegaly and other mediastinal contours. Mildly irregular tracheal air column appears stable from previous exams and was atelectatic on the CTA last month. No superimposed pneumothorax. Pulmonary vascularity does appear mildly increased. No air bronchograms. Paucity of bowel gas. Chronic lower spinal fusion hardware. IMPRESSION: 1. Bilateral pleural effusions, small but that on the left appears increased since last month. Bilateral lung base atelectasis suspected. 2. Cardiomegaly with increased pulmonary vascularity. Cannot exclude mild or developing interstitial edema. 3. Question underlying Tracheomalacia. Electronically Signed   By: Odessa Fleming M.D.   On: 04/01/2023 04:07    Microbiology: Recent Results (from the past 240 hour(s))  SARS Coronavirus 2 by RT PCR (hospital order, performed in [facility] hospital lab) *cepheid single result test* Anterior Nasal Swab     Status: None   Collection Time: 04/01/23  3:47 AM   Specimen: Anterior Nasal Swab  Result Value Ref Range Status   SARS Coronavirus 2 by RT PCR NEGATIVE NEGATIVE Final    Comment: (NOTE) SARS-CoV-2 target nucleic acids are NOT DETECTED.  The SARS-CoV-2 RNA is generally detectable in upper and lower respiratory specimens during the acute phase of infection. The lowest concentration of SARS-CoV-2 viral copies this assay can detect is 250 copies / mL. A negative result does not preclude SARS-CoV-2 infection and should not be used as the sole basis for treatment or other patient management decisions.  A negative result may occur with improper specimen collection / handling, submission of specimen other than nasopharyngeal swab, presence of viral mutation(s) within the areas targeted by this assay, and inadequate number of viral copies (<250 copies / mL). A negative result must be combined with clinical observations, patient history,  and epidemiological information.  Fact Sheet for Patients:   RoadLapTop.co.za  Fact Sheet for Healthcare Providers: http://kim-miller.com/  This test is not yet approved or  cleared by the [place] FDA and has been authorized for detection and/or diagnosis of SARS-CoV-2 by FDA under an Emergency Use Authorization (EUA).  This EUA will remain in effect (meaning this test can be used) for the duration of the COVID-19 declaration under Section 564(b)(1) of the Act, 21 U.S.C. section 360bbb-3(b)(1), unless the authorization is terminated or revoked sooner.  Performed at [facility], [address] [place], [place] [address]      Labs: CBC: Recent Labs  Lab 04/04/23 [id number] 04/05/23 0851 04/06/23 0436 04/07/23 0538 04/08/23 0546  WBC 10.6* 9.7 8.4 9.7 10.9*  HGB 11.3* 12.5 11.6* 11.7* 12.3  HCT 36.8 42.1 37.3 37.6 40.0  MCV 90.6 94.6 92.8 92.8 92.2  PLT 137* 138* 121* 130* 158   Basic Metabolic Panel: Recent Labs  Lab 04/04/23 0446 04/05/23 0851 04/06/23 0436 04/07/23 0538 04/08/23 0546  NA 140 143 140 140 143  K 4.5 4.9 4.7 4.6 4.8  CL 98 98 99 100 98  CO2 33* 37* 34* 36* 37*  GLUCOSE 157* 175* 291* 243* 117*  BUN 84* 81* 78* 74* 79*  CREATININE 1.85* 1.63* 1.68* 1.58* 1.62*  CALCIUM 8.7* 8.8* 8.6* 8.8* 8.9  MG 2.9* 2.7* 2.6* 2.5* 2.4  PHOS 4.1 4.0 3.5 3.5 3.8   Liver Function Tests: No results for input(s): "AST", "ALT", "ALKPHOS", "BILITOT", "PROT", "ALBUMIN" in the last 168 hours. No results for input(s): "LIPASE", "AMYLASE" in the last 168 hours. No results for input(s): "AMMONIA" in the last 168 hours. Cardiac Enzymes: No results for input(s): "CKTOTAL", "CKMB", "CKMBINDEX", "TROPONINI" in the last 168 hours. BNP (last 3 results) Recent Labs    11/16/22 0001 02/19/23 2110 04/01/23 0347  BNP 174.7* 623.0* 524.8*   CBG: Recent Labs  Lab 04/07/23 1240 04/07/23 1734 04/07/23 2211  04/08/23 0826 04/08/23 1204  GLUCAP 154* 194* 209* 117* 244*    Time spent: 35 minutes  Signed:  Gillis Santa  [facility] 04/08/2023 12:17 PM

## 2023-04-08 NOTE — Plan of Care (Signed)
  Problem: Education: Goal: Ability to describe self-care measures that may prevent or decrease complications (Diabetes Survival Skills Education) will improve Outcome: Progressing Goal: Individualized Educational Video(s) Outcome: Progressing   Problem: Coping: Goal: Ability to adjust to condition or change in health will improve Outcome: Progressing   Problem: Fluid Volume: Goal: Ability to maintain a balanced intake and output will improve Outcome: Progressing   Problem: Health Behavior/Discharge Planning: Goal: Ability to identify and utilize available resources and services will improve Outcome: Progressing Goal: Ability to manage health-related needs will improve Outcome: Progressing   Problem: Metabolic: Goal: Ability to maintain appropriate glucose levels will improve Outcome: Progressing   Problem: Nutritional: Goal: Maintenance of adequate nutrition will improve Outcome: Progressing Goal: Progress toward achieving an optimal weight will improve Outcome: Progressing   Problem: Skin Integrity: Goal: Risk for impaired skin integrity will decrease Outcome: Progressing   Problem: Tissue Perfusion: Goal: Adequacy of tissue perfusion will improve Outcome: Progressing   Problem: Education: Goal: Ability to demonstrate management of disease process will improve Outcome: Progressing Goal: Ability to verbalize understanding of medication therapies will improve Outcome: Progressing Goal: Individualized Educational Video(s) Outcome: Progressing   Problem: Activity: Goal: Capacity to carry out activities will improve Outcome: Progressing   Problem: Cardiac: Goal: Ability to achieve and maintain adequate cardiopulmonary perfusion will improve Outcome: Progressing   Problem: Education: Goal: Knowledge of disease or condition will improve Outcome: Progressing Goal: Knowledge of the prescribed therapeutic regimen will improve Outcome: Progressing Goal:  Individualized Educational Video(s) Outcome: Progressing   Problem: Activity: Goal: Ability to tolerate increased activity will improve Outcome: Progressing Goal: Will verbalize the importance of balancing activity with adequate rest periods Outcome: Progressing   Problem: Respiratory: Goal: Ability to maintain a clear airway will improve Outcome: Progressing Goal: Levels of oxygenation will improve Outcome: Progressing Goal: Ability to maintain adequate ventilation will improve Outcome: Progressing   Problem: Education: Goal: Knowledge of General Education information will improve Description: Including pain rating scale, medication(s)/side effects and non-pharmacologic comfort measures Outcome: Progressing   Problem: Health Behavior/Discharge Planning: Goal: Ability to manage health-related needs will improve Outcome: Progressing   Problem: Clinical Measurements: Goal: Ability to maintain clinical measurements within normal limits will improve Outcome: Progressing Goal: Will remain free from infection Outcome: Progressing Goal: Diagnostic test results will improve Outcome: Progressing Goal: Respiratory complications will improve Outcome: Progressing Goal: Cardiovascular complication will be avoided Outcome: Progressing   Problem: Activity: Goal: Risk for activity intolerance will decrease Outcome: Progressing   Problem: Nutrition: Goal: Adequate nutrition will be maintained Outcome: Progressing   Problem: Coping: Goal: Level of anxiety will decrease Outcome: Progressing   Problem: Elimination: Goal: Will not experience complications related to bowel motility Outcome: Progressing Goal: Will not experience complications related to urinary retention Outcome: Progressing   Problem: Pain Managment: Goal: General experience of comfort will improve Outcome: Progressing   Problem: Safety: Goal: Ability to remain free from injury will improve Outcome:  Progressing   Problem: Skin Integrity: Goal: Risk for impaired skin integrity will decrease Outcome: Progressing

## 2023-04-08 NOTE — ED Triage Notes (Signed)
Pt presents to triage via [facility] from home for weakness. Pt was just D/C to and when she got home she didn't have the necessary O2 requirements (on 2L [place]) and her family stated that she was weaker than normal. A&Ox4 at this time. Denies CP or SOB.

## 2023-04-08 NOTE — ED Provider Notes (Signed)
[facility] Provider Note    Event Date/Time   First MD Initiated Contact with Patient 04/08/23 2303     (approximate)   History   Weakness   HPI  Cheyenne Frank is a 82 y.o. female  HTN, JHLD, DM, COPD not on O2, dCHF, stroke, dementia, depression, CKD-4, morbid obesity, atrial fibrillation on Eliquis, subdural hematoma, RLS, fibromyalgia  per d/c summary from today    Patient and family reports she was discharged today.  Family reports that significant difficulty getting the patient into the vehicle to take her home she was more weak than typical, and they had to call the administrator on-call who offered that she may need to be return to the hospital, but patient was able to make at home with significant assistance.  Since being home though family notes that she is more weak than typical, she has required more than 1 person assistance, and her oxygen that was she was supposed to be prescribed has not arrived.  She had an oxygen level of "79%" at the home.  She is also increasingly weak, and seems somewhat more debilitated than prior to hospitalization.  The patient has no complaint of pain.  After being placed back on oxygen she reports her breathing feels normal.  She has no complaints right now other than just feeling "more weak"  No focal weakness.  Family reports she will occasionally have difficulty with speech when she gets upset or has pressured speaking due to an old stroke.  No new changes.  Physical Exam   Triage Vital Signs: ED Triage Vitals [04/08/23 2133]  Encounter Vitals Group     BP (!) 160/76     Systolic BP Percentile      Diastolic BP Percentile      Pulse Rate 62     Resp 14     Temp 98.4 F (36.9 C)     Temp Source Oral     SpO2 100 %     Weight      Height      Head Circumference      Peak Flow      Pain Score      Pain Loc      Pain Education      Exclude from Growth Chart     Most recent vital signs: Vitals:    04/08/23 2133 04/09/23 0130  BP: (!) 160/76 (!) 165/65  Pulse: 62 63  Resp: 14 17  Temp: 98.4 F (36.9 C)   SpO2: 100% 100%     General: Awake, no distress.  Speech is clear.  She appears mildly fatigued but in no distress.  Appears chronically ill.  Currently 98% on 2 L oxygen, but I do turn her oxygen off after about 2 to 3 minutes oxygen level decreased to 89% but quickly regained 98% placed back on 2 L CV:  Good peripheral perfusion.  Normal tones and rate Resp:  Normal effort.  Very mild lower wheezing noted in the bases bilaterally with slight diminished lung sounds in the bases posteriorly.  No distress.  Upper lobes clear bilateral. Abd:  No distention.  Other:  Patient is able to sit herself up with her 2 arms for examination of her lungs and back.  She demonstrates 4+ strength in the lower extremities bilaterally but it does appear generally fatigued  Mucous membranes are moist   ED Results / Procedures / Treatments   Labs (all labs ordered are listed, but  only abnormal results are displayed) Labs Reviewed  BASIC METABOLIC PANEL - Abnormal; Notable for the following components:      Result Value   Potassium 5.2 (*)    Chloride 97 (*)    CO2 34 (*)    Glucose, Bld 112 (*)    BUN 82 (*)    Creatinine, Ser 1.74 (*)    GFR, Estimated 29 (*)    All other components within normal limits  CBC - Abnormal; Notable for the following components:   Platelets 145 (*)    All other components within normal limits  TROPONIN I (HIGH SENSITIVITY)  TROPONIN I (HIGH SENSITIVITY)   Labs notable for chronic renal disease with GFR of approximately 29 and creatinine 1.7.  Potassium very minimally elevated at 5.2.  CBC within normal limits with exception to very mildly reduced platelet count at 145  EKG  And interpreted by me at 2130 heart rate 60 QRS 80 QTc 440 Normal sinus rhythm no evidence of acute ischemia.  Slight baseline artifact   RADIOLOGY  Chest x-ray interpreted by me  as, stable blood present small bilateral pleural effusions and cardiomegaly  DG Chest Port 1 View  Result Date: 04/08/2023 CLINICAL DATA:  Weakness EXAM: PORTABLE CHEST 1 VIEW COMPARISON:  Chest x-ray 04/01/2023 FINDINGS: The heart is mildly enlarged, unchanged. Small pleural effusions are likely present. There is no focal lung infiltrate or pneumothorax. No acute fractures are seen. Thoracolumbar fusion hardware is present. IMPRESSION: 1. Mild cardiomegaly. 2. Small pleural effusions. 3. No focal lung infiltrate. Electronically Signed   By: Darliss Cheney M.D.   On: 04/08/2023 22:03      PROCEDURES:  Critical Care performed: No  Procedures   MEDICATIONS ORDERED IN ED: Medications  albuterol (VENTOLIN HFA) 108 (90 Base) MCG/ACT inhaler 2 puff (has no administration in time range)  apixaban (ELIQUIS) tablet 2.5 mg (has no administration in time range)  bisacodyl (DULCOLAX) EC tablet 10 mg (has no administration in time range)  diltiazem (CARDIZEM CD) 24 hr capsule 120 mg (has no administration in time range)  fluticasone furoate-vilanterol (BREO ELLIPTA) 100-25 MCG/ACT 1 puff (has no administration in time range)  gabapentin (NEURONTIN) capsule 300 mg (has no administration in time range)  iron polysaccharides (NIFEREX) capsule 150 mg (has no administration in time range)  multivitamin with minerals tablet 1 tablet (has no administration in time range)  oxybutynin (DITROPAN-XL) 24 hr tablet 10 mg (has no administration in time range)  PARoxetine (PAXIL) tablet 40 mg (has no administration in time range)  predniSONE (DELTASONE) tablet 10 mg (has no administration in time range)  sotalol (BETAPACE) tablet 80 mg (has no administration in time range)  torsemide (DEMADEX) tablet 20 mg (has no administration in time range)  insulin glargine-yfgn (SEMGLEE) injection 64 Units (has no administration in time range)  insulin aspart (novoLOG) injection 0-20 Units (has no administration in time  range)  insulin aspart (novoLOG) injection 0-5 Units (has no administration in time range)  sodium zirconium cyclosilicate (LOKELMA) packet 5 g (has no administration in time range)  ipratropium-albuterol (DUONEB) 0.5-2.5 (3) MG/3ML nebulizer solution 3 mL (3 mLs Nebulization Given 04/08/23 2331)     IMPRESSION / MDM / ASSESSMENT AND PLAN / ED COURSE  I reviewed the triage vital signs and the nursing notes.                              Differential diagnosis includes, but is  not limited to, deconditioning, increasing fatigue and weakness, ongoing oxygen requirement, mild COPD ongoing suspected given slight wheezing will give DuoNeb.  She is currently on steroid treatment, chronic renal disease etc.  In this particular instance it appears the patient was too fatigued and generally weak and has a change in her baseline status making it difficult for her to go home with assistance of only 1 person and also the need for oxygen which has not yet arrived home  I have placed a consult to the hospitalist service for consultation and requested the patient be readmitted for further care and treatment as the patient continues to have debility and generalized weakness precluding her from be able to return to her previous care setting  Consulted with Dr. Para March, hospitalist will see and evaluate patient in consultation.  Upon discussion, hospitalist recommends that we obtain [facility] consultation for potential need for skilled nursing placement or other options for ongoing rehab and care given increasing fatigue and weakness.  Full consultation to follow, but at present time Dr. Para March recommending patient remain in the ER for [facility] consult. Hospitalist will write for home meds, complete consult.   Patient's presentation is most consistent with acute complicated illness / injury requiring diagnostic workup.   The patient is on the cardiac monitor to evaluate for evidence of arrhythmia and/or significant heart  rate changes.  Reviewed final hospitalist consult per Dr. Para March  "    Recommendations:  -Place patient on oxygen at 2 L - Continue Home medications while in the ED.   -One-time dose of Lokelma for potassium of 5.4 -Patient can be discharged from the ED once oxygen delivered to the home which I expect will occur in the next few hours -Consult PT and TOC while awaiting discharge from the ED. Home health versus SNF can be discussed  "     FINAL CLINICAL IMPRESSION(S) / ED DIAGNOSES   Final diagnoses:  Generalized weakness  Physical deconditioning     Rx / DC Orders   ED Discharge Orders     None        Note:  This document was prepared using Dragon voice recognition software and may include unintentional dictation errors.   Sharyn Creamer, MD 04/09/23 [phone]

## 2023-04-08 NOTE — TOC Transition Note (Signed)
Transition of Care [facility]) - CM/SW Discharge Note   Patient Details  Name: Cheyenne Frank MRN: [id number] Date of Birth: 08-28-1941  Transition of Care [facility]) CM/SW Contact:  Truddie Hidden, RN Phone Number: 04/08/2023, 1:35 PM   Clinical Narrative:    Spoke with patient and her daughter regarding discharge home today.  Patient is currently active with [facility].  Patient did not have a choice of oxygen provider.  Oxygen request sent to [facility] from [place] Her daughter will transport her home. RNCM advised the [facility] agency will call to schedule an appointment for resumption of care.  Nurse and MD notified.   TOC signing         Patient Goals and CMS Choice      Discharge Placement                         Discharge Plan and Services Additional resources added to the After Visit Summary for                                       Social Determinants of Health (SDOH) Interventions SDOH Screenings   Food Insecurity: Patient Declined (04/01/2023)  Housing: Patient Declined (04/01/2023)  Transportation Needs: Patient Declined (04/01/2023)  Utilities: Patient Declined (04/01/2023)  Financial Resource Strain: Low Risk  (07/08/2017)   Received from [facility], [facility]  Physical Activity: Inactive (07/08/2017)   Received from [facility], [facility]  Social Connections: Unknown (07/08/2017)   Received from [facility], [facility]  Tobacco Use: Low Risk  (04/01/2023)     Readmission Risk Interventions    02/22/2023   10:37 AM 07/18/2022    3:27 PM 01/28/2022    2:28 PM  Readmission Risk Prevention Plan  Transportation Screening Complete Complete Complete  PCP or Specialist Appt within 3-5 Days   Complete  HRI or Home Care Consult   Complete  Social Work Consult for Recovery Care Planning/Counseling   Complete  Palliative Care Screening   Complete   Medication Review [employer]) Complete Complete Complete  PCP or Specialist appointment within 3-5 days of discharge Complete Complete   HRI or Home Care Consult  Complete   SW Recovery Care/Counseling Consult Complete Not Complete   SW Consult Not Complete Comments  NA   Palliative Care Screening Not Applicable Not Applicable   Skilled Nursing Facility Not Applicable Not Applicable

## 2023-04-08 NOTE — Care Management Important Message (Signed)
Important Message  Patient Details  Name: Cheyenne Frank MRN: [id number] Date of Birth: Dec 15, 1940   Medicare Important Message Given:  Yes     [name] 04/08/2023, 12:24 PM

## 2023-04-09 DIAGNOSIS — R531 Weakness: Secondary | ICD-10-CM | POA: Diagnosis not present

## 2023-04-09 DIAGNOSIS — R5381 Other malaise: Secondary | ICD-10-CM

## 2023-04-09 LAB — CBG MONITORING, ED
Glucose-Capillary: 96 mg/dL (ref 70–99)
Glucose-Capillary: 149 mg/dL — ABNORMAL HIGH (ref 70–99)
Glucose-Capillary: 269 mg/dL — ABNORMAL HIGH (ref 70–99)
Glucose-Capillary: 187 mg/dL — ABNORMAL HIGH (ref 70–99)

## 2023-04-09 LAB — TROPONIN I (HIGH SENSITIVITY): Troponin I (High Sensitivity): 12 ng/L (ref <18)

## 2023-04-09 MED ORDER — POLYSACCHARIDE IRON COMPLEX 150 MG PO CAPS
150.0000 mg | ORAL_CAPSULE | Freq: Every day | ORAL | Status: DC
  Administered 2023-04-09 – 2023-04-11 (×3): 150 mg via ORAL
  Filled 2023-04-09 (×3): qty 1

## 2023-04-09 MED ORDER — APIXABAN 2.5 MG PO TABS
2.5000 mg | ORAL_TABLET | Freq: Two times a day (BID) | ORAL | Status: DC
  Administered 2023-04-09 – 2023-04-11 (×5): 2.5 mg via ORAL
  Filled 2023-04-09 (×6): qty 1

## 2023-04-09 MED ORDER — BISACODYL 5 MG PO TBEC: 10.0000 mg | DELAYED_RELEASE_TABLET | Freq: Every day | ORAL | Status: DC | PRN

## 2023-04-09 MED ORDER — SODIUM ZIRCONIUM CYCLOSILICATE 5 G PO PACK
5.0000 g | PACK | Freq: Once | ORAL | Status: AC
  Administered 2023-04-09: 5 g via ORAL
  Filled 2023-04-09: qty 1

## 2023-04-09 MED ORDER — DILTIAZEM HCL ER COATED BEADS 120 MG PO CP24
120.0000 mg | ORAL_CAPSULE | Freq: Every day | ORAL | Status: DC
  Administered 2023-04-09 – 2023-04-11 (×3): 120 mg via ORAL
  Filled 2023-04-09 (×3): qty 1

## 2023-04-09 MED ORDER — PREDNISONE 10 MG PO TABS
10.0000 mg | ORAL_TABLET | Freq: Every day | ORAL | Status: AC
  Administered 2023-04-09 – 2023-04-11 (×3): 10 mg via ORAL
  Filled 2023-04-09 (×3): qty 1

## 2023-04-09 MED ORDER — INSULIN GLARGINE-YFGN 100 UNIT/ML ~~LOC~~ SOLN
64.0000 [IU] | Freq: Every day | SUBCUTANEOUS | Status: DC
  Administered 2023-04-09: 64 [IU] via SUBCUTANEOUS
  Filled 2023-04-09: qty 0.64

## 2023-04-09 MED ORDER — INSULIN ASPART 100 UNIT/ML IJ SOLN
0.0000 [IU] | Freq: Three times a day (TID) | INTRAMUSCULAR | Status: DC
  Administered 2023-04-09: 3 [IU] via SUBCUTANEOUS
  Administered 2023-04-09: 11 [IU] via SUBCUTANEOUS
  Administered 2023-04-10: 15 [IU] via SUBCUTANEOUS
  Administered 2023-04-11: 7 [IU] via SUBCUTANEOUS
  Administered 2023-04-11: 4 [IU] via SUBCUTANEOUS
  Administered 2023-04-11: 11 [IU] via SUBCUTANEOUS
  Filled 2023-04-09 (×6): qty 1

## 2023-04-09 MED ORDER — SOTALOL HCL 80 MG PO TABS
80.0000 mg | ORAL_TABLET | Freq: Two times a day (BID) | ORAL | Status: DC
  Administered 2023-04-09 – 2023-04-11 (×5): 80 mg via ORAL
  Filled 2023-04-09 (×6): qty 1

## 2023-04-09 MED ORDER — ALBUTEROL SULFATE HFA 108 (90 BASE) MCG/ACT IN AERS
2.0000 | INHALATION_SPRAY | Freq: Four times a day (QID) | RESPIRATORY_TRACT | Status: DC | PRN
  Filled 2023-04-09: qty 6.7

## 2023-04-09 MED ORDER — TORSEMIDE 20 MG PO TABS
20.0000 mg | ORAL_TABLET | Freq: Every day | ORAL | Status: DC
  Administered 2023-04-09 – 2023-04-11 (×3): 20 mg via ORAL
  Filled 2023-04-09 (×3): qty 1

## 2023-04-09 MED ORDER — ADULT MULTIVITAMIN W/MINERALS CH
1.0000 | ORAL_TABLET | Freq: Every day | ORAL | Status: DC
  Administered 2023-04-09 – 2023-04-11 (×3): 1 via ORAL
  Filled 2023-04-09 (×3): qty 1

## 2023-04-09 MED ORDER — INSULIN ASPART 100 UNIT/ML IJ SOLN: 0.0000 [IU] | Freq: Every day | INTRAMUSCULAR | Status: DC

## 2023-04-09 MED ORDER — GABAPENTIN 300 MG PO CAPS
300.0000 mg | ORAL_CAPSULE | Freq: Two times a day (BID) | ORAL | Status: DC
  Administered 2023-04-09 – 2023-04-11 (×5): 300 mg via ORAL
  Filled 2023-04-09 (×5): qty 1

## 2023-04-09 MED ORDER — PAROXETINE HCL 20 MG PO TABS
40.0000 mg | ORAL_TABLET | Freq: Every day | ORAL | Status: DC
  Administered 2023-04-09 – 2023-04-10 (×2): 40 mg via ORAL
  Filled 2023-04-09 (×3): qty 2

## 2023-04-09 MED ORDER — FLUTICASONE FUROATE-VILANTEROL 100-25 MCG/ACT IN AEPB
1.0000 | INHALATION_SPRAY | Freq: Every day | RESPIRATORY_TRACT | Status: DC
  Administered 2023-04-09 – 2023-04-11 (×3): 1 via RESPIRATORY_TRACT
  Filled 2023-04-09 (×2): qty 28

## 2023-04-09 MED ORDER — OXYBUTYNIN CHLORIDE ER 10 MG PO TB24
10.0000 mg | ORAL_TABLET | Freq: Every day | ORAL | Status: DC
  Administered 2023-04-09 – 2023-04-10 (×2): 10 mg via ORAL
  Filled 2023-04-09 (×2): qty 1

## 2023-04-09 NOTE — ED Provider Notes (Signed)
This is the only will not be able to assist with medicaiton holding orders.  I called and reviewed sotalol as well as her diabetes medications with our pharmacist, Harrold Donath, and have written orders to reflect his review and recommendations as well. Also placed consult to diabetes coordinator for ongoing follow-up/eval for DM treatmetns while boarding in ER   Sharyn Creamer, MD 04/09/23 [phone]

## 2023-04-09 NOTE — ED Provider Notes (Signed)
Ongoing care and disposition assigned to Dr. Vicente Males  Following up on recommendations from [facility] team and arranging either home health and home oxygen versus possible SNF placement   Sharyn Creamer, MD 04/09/23 [phone]

## 2023-04-09 NOTE — ED Notes (Signed)
Pt eating dinner at this time

## 2023-04-09 NOTE — ED Notes (Signed)
This RN updated pt daughter on plan, that nothing has changed at this time.

## 2023-04-09 NOTE — ED Notes (Signed)
Pt was placed on a bed pan upon RN arrival, pt felt uncomfortable and did not want to use it. Pt daughter at bedside, pt educated that it is not safe to get pt up at this time. Pt agreeable now.

## 2023-04-09 NOTE — ED Notes (Signed)
Pt ate 25% of dinner and is upset about why she cannot go home, RN comforted pt and repositioned her in bed.

## 2023-04-09 NOTE — ED Notes (Signed)
Pt sleeping at this time.

## 2023-04-09 NOTE — Progress Notes (Addendum)
No charge progress note.  Patient with multiple medical comorbidities, discharged on 7/22 who returns to the ED because oxygen had not been delivered at home, with ongoing weakness related to general physical deconditioning with multiple comorbidities.  Patient had been evaluated by PT throughout his stay with plan to go home with home health.  No acute medical condition necessitating hospitalization at this time.   Admission was declined and she was never readmitted.  Patient was supposed to go back home once oxygen is delivered but now family wants her to go to a rehab place.  TOC is aware and working with PT to see if we can send her to a rehab facility.  Patient will continue on current level of oxygen and current medications.

## 2023-04-09 NOTE — Consult Note (Signed)
Initial Consultation Note   Patient: Cheyenne Frank DGL:[id number] DOB: January 10, 1941 PCP: Laurine Blazer, MD DOA: 04/08/2023 DOS: the patient was seen and examined on 04/09/2023 Primary service: Sharyn Creamer, MD  Referring physician: Dr Fanny Bien Reason for consult: weakness   Assessment: Patient with multiple medical comorbidities, discharged on 7/22 who returns to the ED because oxygen had not been delivered at home, with ongoing weakness related to general physical deconditioning with multiple comorbidities.  Patient had been evaluated by PT throughout his stay with plan to go home with home health.  No acute medical condition necessitating hospitalization at this time.  Recommendations:  -Place patient on oxygen at 2 L - Continue Home medications while in the ED.   -One-time dose of Lokelma for potassium of 5.4 -Patient can be discharged from the ED once oxygen delivered to the home which I expect will occur in the next few hours -Consult PT and TOC while awaiting discharge from the ED. Home health versus SNF can be discussed  Assessment and Plan:  Mild hyperkalemia Physical deconditioning Morbid obesity, BMI 40-49.9 Osteoarthritis/postlaminectomy syndrome Obstructive sleep apnea Chronic respiratory failure with hypoxia Atrial fibrillation COPD Diastolic CHF CKD G3  Type 2 diabetes with nephropathy Hypertension Diabetes with long-term current insulin use Seizure disorder Dementia without behavioral disturbance History of CVA  -Continue home meds for all above medical problems which are stable -Continue home O2 at 2 L -Lokelma x 1 dose  Recommendations discussed with Dr. Fanny Bien    [facility] will sign off at present, please call us again when needed.  HPI: Cheyenne Frank is a 82 y.o. female with past medical history of DM, HTN, D CHF, dementia, depression, CKD 4, morbid obesity, atrial fibrillation on Eliquis, COPD, started on O2 at 2 L during hospitalization from 7/15 to  04/08/2023, discharged several hours prior who was brought back to the emergency room due to difficulty with transfers and oxygen not being available at home upon her arrival.  On review of last physical therapy note from 7/20. Patient appeared to need assistance with all transfers.  Patient's preference was to be discharged with home health.  During her stay she was treated for A-fib with RVR, weaned off Cardizem infusion to oral Cardizem and sotalol as well as for COPD exacerbation and CHF exacerbation.  Oxygen was arranged for home use with up pulmonology follow-up for PFTs.  Of note during her physical therapy evaluations patient was noted to be tachypneic and tachycardic ED course and data review: BP 160/76 with otherwise normal vitals Labs with creatinine 1.74, slightly up from baseline and potassium 5.2.  Troponin 12. EKG showed NSR at 72 with no concerning ischemic ST-T wave changes Chest x-ray showed cardiomegaly with small pleural effusions and no infiltrate Patient was treated with a DuoNeb.  Hospitalist was consulted.. On my assessment, patient resting comfortably.  She is denying any complaints.  Denies chest pains or shortness of breath.  Review of Systems  All other systems reviewed and are negative.     Past Medical History:  Diagnosis Date   AKI (acute kidney injury) (HCC)    Anemia of chronic disease    Anxiety    Arthritis    Asthma    no problems since 1999 (after moving into a new home)   Chronic airway obstruction (HCC)    Complication of anesthesia    " feel like I'm dying after I wake up"   Diabetes mellitus without complication (HCC)    Diverticulosis  Dysrhythmia    unknown type    Fibromyalgia    Flat back syndrome    GERD (gastroesophageal reflux disease)    Hx of transfusion 2011   Hypertension    Itching    Macular degeneration    Nocturia    Postoperative anemia due to acute blood loss 04/09/2013   Restless leg syndrome    Sleep apnea    Dx  1990's unable to wear c-pap -   Stroke (HCC) 2010   verbal aphasia x 30 min - resolved - no problem since then   Past Surgical History:  Procedure Laterality Date   ABDOMINAL HYSTERECTOMY     rt so   BACK SURGERY  2010 / 2011   BREAST REDUCTION SURGERY     BURR HOLE FOR SUBDURAL HEMATOMA  2015   cataracts removed     CHOLECYSTECTOMY  1990   DILATION AND CURETTAGE OF UTERUS     HERNIA REPAIR     POSTERIOR LAMINECTOMY / DECOMPRESSION LUMBAR SPINE     with T10-S1 fusion   TONSILLECTOMY     TOTAL KNEE ARTHROPLASTY Left 04/06/2013   Procedure: LEFT TOTAL KNEE ARTHROPLASTY;  Surgeon: Loanne Drilling, MD;  Location: [facility];  Service: Orthopedics;  Laterality: Left;   Social History:  reports that she has never smoked. She has never used smokeless tobacco. She reports that she does not drink alcohol and does not use drugs.  Allergies  Allergen Reactions   Codeine Palpitations    Other Reaction: RACES HEART   Dilaudid [Hydromorphone] Other (See Comments) and Rash    Other Reaction: lightheaded, altered ms, nervous, Paranoia paranoid Other Reaction: lightheaded, altered ms, nervous, Paranoia   Demerol [Meperidine] Other (See Comments) and Rash    agitated Agitation   Pravastatin Other (See Comments)    Muscle aches   Amiodarone Other (See Comments)    Per family pt had twitching while taking and cardiologist took pt off    Keppra [Levetiracetam]    Lipitor [Atorvastatin] Other (See Comments) and Itching    unknown   Nifedipine Other (See Comments)    Unknown - tolerates diltiazem   Statins Rash    Makes her crazy   Sulfa Antibiotics Rash   Vancomycin Rash    deafness    Family History  Problem Relation Age of Onset   COPD Sister        had lung transplant   COPD Brother     Prior to Admission medications   Medication Sig Start Date End Date Taking? Authorizing Provider  acetaminophen (TYLENOL) 500 MG tablet Take 500-1,000 mg by mouth every 6 (six) hours as needed for  mild pain or fever.     [provider]  albuterol (VENTOLIN HFA) 108 (90 Base) MCG/ACT inhaler Inhale 2 puffs into the lungs every 6 (six) hours as needed for wheezing or shortness of breath. 05/19/20   [provider]  apixaban (ELIQUIS) 2.5 MG TABS tablet Take 1 tablet (2.5 mg total) by mouth 2 (two) times daily. 04/08/23 04/07/24  Gillis Santa, MD  bisacodyl (DULCOLAX) 5 MG EC tablet Take 2 tablets (10 mg total) by mouth daily as needed for moderate constipation. 04/08/23   Gillis Santa, MD  diltiazem (CARDIZEM CD) 120 MG 24 hr capsule Take 1 capsule (120 mg total) by mouth daily. 04/08/23 10/05/23  Gillis Santa, MD  fluticasone furoate-vilanterol (BREO ELLIPTA) 100-25 MCG/ACT AEPB Inhale 1 puff into the lungs daily. 04/08/23 05/08/23  Gillis Santa, MD  gabapentin (  NEURONTIN) 300 MG capsule Take 1 capsule (300 mg total) by mouth 2 (two) times daily. 02/07/22   Rhetta Mura, MD  guaiFENesin (ROBITUSSIN) 100 MG/5ML liquid Take 5 mLs by mouth every 6 (six) hours as needed for cough or to loosen phlegm. 04/08/23   Gillis Santa, MD  insulin glargine (LANTUS) 100 UNIT/ML injection Inject 0.64 mLs (64 Units total) into the skin at bedtime. 11/18/22   Lurene Shadow, MD  insulin lispro (HUMALOG KWIKPEN) 100 UNIT/ML KwikPen Inject 22 Units into the skin 3 (three) times daily. 03/14/23   [provider]  insulin lispro (HUMALOG) 100 UNIT/ML injection Inject 0.05 mLs (5 Units total) into the skin 3 (three) times daily with meals. 02/23/23   Lucile Shutters, MD  iron polysaccharides (NIFEREX) 150 MG capsule Take 1 capsule (150 mg total) by mouth daily. 04/09/23 07/08/23  Gillis Santa, MD  Multiple Vitamin (MULTIVITAMIN WITH MINERALS) TABS tablet Take 1 tablet by mouth daily.    [provider]  Multiple Vitamins-Minerals (ICAPS AREDS 2 PO) Take 1 capsule by mouth in the morning and at bedtime.    [provider]  omeprazole (PRILOSEC) 40 MG capsule Take 40 mg by mouth  daily.     [provider]  oxybutynin (DITROPAN-XL) 10 MG 24 hr tablet Take 10 mg by mouth at bedtime. 10/09/21   [provider]  PARoxetine (PAXIL) 40 MG tablet Take 1 tablet (40 mg total) by mouth at bedtime. 02/07/22   Rhetta Mura, MD  predniSONE (DELTASONE) 10 MG tablet Take 1 tablet (10 mg total) by mouth daily with breakfast for 3 days. 04/09/23 04/12/23  Gillis Santa, MD  rosuvastatin (CRESTOR) 5 MG tablet Take 2.5 mg by mouth at bedtime.    [provider]  sitaGLIPtin (JANUVIA) 50 MG tablet Take 50 mg by mouth daily.    [provider]  sotalol (BETAPACE) 80 MG tablet Take 1 tablet (80 mg total) by mouth every 12 (twelve) hours. 04/08/23 10/05/23  Gillis Santa, MD  torsemide (DEMADEX) 20 MG tablet Take 1 tablet (20 mg total) by mouth daily. Please start from 11/01/2021 10/30/21   Arnetha Courser, MD  traZODone (DESYREL) 50 MG tablet Take 1.5 tablets (75 mg total) by mouth at bedtime. 11/18/22   Lurene Shadow, MD    Physical Exam: Vitals:   04/08/23 2133  BP: (!) 160/76  Pulse: 62  Resp: 14  Temp: 98.4 F (36.9 C)  TempSrc: Oral  SpO2: 100%   Physical Exam Vitals and nursing note reviewed.  Constitutional:      General: She is not in acute distress. HENT:     Head: Normocephalic and atraumatic.  Cardiovascular:     Rate and Rhythm: Normal rate and regular rhythm.     Heart sounds: Normal heart sounds.  Pulmonary:     Effort: Pulmonary effort is normal.     Breath sounds: Normal breath sounds.  Abdominal:     Palpations: Abdomen is soft.     Tenderness: There is no abdominal tenderness.  Musculoskeletal:     Right lower leg: Edema present.     Left lower leg: Edema present.  Neurological:     Mental Status: Mental status is at baseline.     Data Reviewed: Relevant notes from primary care and specialist visits, past discharge summaries as available in EHR, including Care Everywhere. Prior diagnostic testing as pertinent to  current admission diagnoses Updated medications and problem lists for reconciliation ED course, including vitals, labs, imaging, treatment and response  to treatment Triage notes, nursing and pharmacy notes and ED provider's notes Notable results as noted in HPI   Family Communication: none Primary team communication: Dr Fanny Bien Thank you very much for involving [place] in the care of your patient.  Author: Andris Baumann, MD 04/09/2023 1:23 AM  For on call review [email or web address].

## 2023-04-09 NOTE — Inpatient Diabetes Management (Signed)
Inpatient Diabetes Program Recommendations  AACE/ADA: New Consensus Statement on Inpatient Glycemic Control (2015)  Target Ranges:  Prepandial:   less than 140 mg/dL      Peak postprandial:   less than 180 mg/dL (1-2 hours)      Critically ill patients:  140 - 180 mg/dL   Lab Results  Component Value Date   GLUCAP 149 (H) 04/09/2023   HGBA1C 10.2 (H) 02/20/2023    Review of Glycemic Control  Latest Reference Range & Units 04/09/23 08:20 04/09/23 11:41  Glucose-Capillary 70 - 99 mg/dL 96 161 (H)   Diabetes history: DM 2 Outpatient Diabetes medications:  Patient was d/c'd home on 7/22 with orders for: Lantus 64 units q HS Humalog 5 units tid with meals Januvia 50 mg daily Current orders for Inpatient glycemic control:  Novolog 0-20 units tid with meals and HS Semglee 64 units q HS Prednisone 10 mg daily  Inpatient Diabetes Program Recommendations:    May consider slight reduction in Semglee to 50 units q HS and add Novolog meal coverage 3 units tid with meals (hold if patient eats less than 50% or NPO).   Thanks,  Beryl Meager, RN, BC-ADM Inpatient Diabetes Coordinator Pager [phone]  (8a-5p)

## 2023-04-09 NOTE — ED Notes (Signed)
The pt was lying supine in the bed sleeping with his head slightly elevated.

## 2023-04-09 NOTE — TOC CM/SW Note (Signed)
Cm received SNF consult. Awaiting PT/OT eval for recommendations. Pt currently active with [facility].

## 2023-04-09 NOTE — ED Notes (Signed)
Pt boosted up in bed and pt ate 30% of meal. Pt repositioned and denies any other needs at this time

## 2023-04-09 NOTE — ED Notes (Signed)
Introduced self to patient. Pt requested assistance with bedpan for bm. Assistance given but no bm noted. Pt reports no longer feels urge for bm. Pt positioned in bed for comfort. Bed low and locked with side rails raised. Call bell in reach and bed alarm in use. Pt noted to be breathing unlabored with symmetric chest rise and fall. Pt denies pain or further needs at this time.

## 2023-04-09 NOTE — Evaluation (Signed)
Physical Therapy Evaluation Patient Details Name: Cheyenne Frank MRN: [id number] DOB: 16-Mar-1941 Today's Date: 04/09/2023  History of Present Illness  82 y/o female presented to ED on 04/08/23 for weakness and inadequate O2 at home after discharged from [facility] on same day. Recently admitted 7/15-7/22 for COPD exacerbation and Afib. PMH: HTN, DM, COPD, CHF, stroke, dementia, depression, CKD-4, Afib, SDH, RLS, fibromyalgia.  Clinical Impression  Patient admitted with the above. PTA, patient lives with caregiver who provides assistance for mobility but patient uses w/c primarily for mobility. Patient oriented to self and place. Patient presents with weakness, impaired balance, and decreased activity tolerance. Required minA for bed mobility and modA for sit to stand with face to face HHA. Able to slide feet towards R with min-modA. Patient will benefit from skilled PT services during acute stay to address listed deficits. Patient will benefit from ongoing therapy at discharge to maximize functional independence and safety.         Assistance Recommended at Discharge Frequent or constant Supervision/Assistance  If plan is discharge home, recommend the following:  Can travel by private vehicle  Help with stairs or ramp for entrance;Assist for transportation;Assistance with cooking/housework;Direct supervision/assist for medications management;A lot of help with walking and/or transfers;A lot of help with bathing/dressing/bathroom   No    Equipment Recommendations None recommended by PT  Recommendations for Other Services       Functional Status Assessment Patient has had a recent decline in their functional status and demonstrates the ability to make significant improvements in function in a reasonable and predictable amount of time.     Precautions / Restrictions Precautions Precautions: Fall Precaution Comments: watch O2 Restrictions Weight Bearing Restrictions: No      Mobility  Bed  Mobility Overal bed mobility: Needs Assistance Bed Mobility: Supine to Sit, Sit to Supine     Supine to sit: Min assist, HOB elevated Sit to supine: Min assist   General bed mobility comments: assist for trunk elevation and returning LEs back into bed    Transfers Overall transfer level: Needs assistance Equipment used: 2 person hand held assist (face to face) Transfers: Sit to/from Stand Sit to Stand: Mod assist           General transfer comment: modA to stand from EOB with face to face HHA. Able to slide feet towards R    Ambulation/Gait                  Stairs            Wheelchair Mobility     Tilt Bed    Modified Rankin (Stroke Patients Only)       Balance Overall balance assessment: Needs assistance Sitting-balance support: Feet supported Sitting balance-Leahy Scale: Fair     Standing balance support: Bilateral upper extremity supported, Reliant on assistive device for balance Standing balance-Leahy Scale: Poor                               Pertinent Vitals/Pain Pain Assessment Pain Assessment: No/denies pain    Home Living Family/patient expects to be discharged to:: Private residence Living Arrangements: Non-relatives/Friends Available Help at Discharge: Friend(s);Available 24 hours/day Type of Home: House Home Access: Ramped entrance     Alternate Level Stairs-Number of Steps: Ramp Home Layout: Two level;Able to live on main level with bedroom/bathroom Home Equipment: Rolling Matteus Mcnelly (2 wheels);Wheelchair - manual;BSC/3in1;Shower seat - built in;Other (comment);Hospital bed Additional Comments: Hospital  bed, trapeze bar,    Prior Function Prior Level of Function : Needs assist             Mobility Comments: caregiver reports pt primarily uses w/c at home, has been working on walking with RW & HHPT once/week, requires assistance for stand pivot transfers, intermittent assistance for bed mobility even with  hospital bed & trapeze bar ADLs Comments: wears depends, assistance for peri hygiene & bathing/dressing.     Hand Dominance        Extremity/Trunk Assessment   Upper Extremity Assessment Upper Extremity Assessment: Generalized weakness    Lower Extremity Assessment Lower Extremity Assessment: Generalized weakness    Cervical / Trunk Assessment Cervical / Trunk Assessment: Normal  Communication   Communication: HOH  Cognition Arousal/Alertness: Awake/alert Behavior During Therapy: WFL for tasks assessed/performed Overall Cognitive Status: History of cognitive impairments - at baseline                                          General Comments      Exercises     Assessment/Plan    PT Assessment Patient needs continued PT services  PT Problem List Decreased strength;Cardiopulmonary status limiting activity;Decreased activity tolerance;Decreased balance;Decreased mobility;Decreased safety awareness;Decreased cognition;Obesity       PT Treatment Interventions Therapeutic exercise;Gait training;Functional mobility training;Therapeutic activities;Patient/family education;Balance training;Cognitive remediation;DME instruction    PT Goals (Current goals can be found in the Care Plan section)  Acute Rehab PT Goals Patient Stated Goal: to return home with continued HHPT PT Goal Formulation: With patient Time For Goal Achievement: 04/23/23 Potential to Achieve Goals: Good    Frequency Min 1X/week     Co-evaluation               AM-PAC PT "6 Clicks" Mobility  Outcome Measure Help needed turning from your back to your side while in a flat bed without using bedrails?: A Little Help needed moving from lying on your back to sitting on the side of a flat bed without using bedrails?: A Little Help needed moving to and from a bed to a chair (including a wheelchair)?: A Lot Help needed standing up from a chair using your arms (e.g., wheelchair or bedside  chair)?: A Lot Help needed to walk in hospital room?: A Lot Help needed climbing 3-5 steps with a railing? : A Lot 6 Click Score: 14    End of Session Equipment Utilized During Treatment: Oxygen Activity Tolerance: Patient tolerated treatment well Patient left: in bed;with bed alarm set;with call bell/phone within reach Nurse Communication: Mobility status PT Visit Diagnosis: Unsteadiness on feet (R26.81);Other abnormalities of gait and mobility (R26.89);Muscle weakness (generalized) (M62.81);Difficulty in walking, not elsewhere classified (R26.2)    Time: 1478-2956 PT Time Calculation (min) (ACUTE ONLY): 15 min   Charges:   PT Evaluation $PT Eval Moderate Complexity: 1 Mod   PT General Charges $$ ACUTE PT VISIT: 1 Visit         Maylon Peppers, PT, DPT Physical Therapist - [facility]  [facility]   Chantia Amalfitano A Rogue Pautler 04/09/2023, 12:26 PM

## 2023-04-10 ENCOUNTER — Encounter: Payer: Commercial Managed Care - PPO | Admitting: Family

## 2023-04-10 ENCOUNTER — Other Ambulatory Visit: Payer: Self-pay

## 2023-04-10 LAB — CBG MONITORING, ED
Glucose-Capillary: 45 mg/dL — ABNORMAL LOW (ref 70–99)
Glucose-Capillary: 45 mg/dL — ABNORMAL LOW (ref 70–99)
Glucose-Capillary: 76 mg/dL (ref 70–99)
Glucose-Capillary: 134 mg/dL — ABNORMAL HIGH (ref 70–99)
Glucose-Capillary: 310 mg/dL — ABNORMAL HIGH (ref 70–99)
Glucose-Capillary: 199 mg/dL — ABNORMAL HIGH (ref 70–99)

## 2023-04-10 MED ORDER — INSULIN GLARGINE-YFGN 100 UNIT/ML ~~LOC~~ SOLN
45.0000 [IU] | Freq: Every day | SUBCUTANEOUS | Status: DC
  Administered 2023-04-10: 45 [IU] via SUBCUTANEOUS
  Filled 2023-04-10 (×2): qty 0.45

## 2023-04-10 NOTE — ED Notes (Signed)
Breakfast tray at bedside. Patient eating.

## 2023-04-10 NOTE — ED Notes (Signed)
Patient resting in bed free from sign of distress. Breathing unlabored  with symmetric chest rise and fall. Bed low and locked with side rails raised x2. Call bell in reach and monitor in place.   

## 2023-04-10 NOTE — Inpatient Diabetes Management (Signed)
Inpatient Diabetes Program Recommendations  AACE/ADA: New Consensus Statement on Inpatient Glycemic Control (2015)  Target Ranges:  Prepandial:   less than 140 mg/dL      Peak postprandial:   less than 180 mg/dL (1-2 hours)      Critically ill patients:  140 - 180 mg/dL   Lab Results  Component Value Date   GLUCAP 76 04/10/2023   HGBA1C 10.2 (H) 02/20/2023    Review of Glycemic Control  Latest Reference Range & Units 04/09/23 08:20 04/09/23 11:41 04/09/23 16:51 04/09/23 21:36 04/10/23 08:27 04/10/23 09:01 04/10/23 09:30  Glucose-Capillary 70 - 99 mg/dL 96 161 (H) 096 (H) 045 (H) 45 (L) 45 (L) 76  Diabetes history: DM 2 Outpatient Diabetes medications:  Patient was d/c'd home on 7/22 with orders for: Lantus 64 units q HS Humalog 5 units tid with meals Januvia 50 mg daily Current orders for Inpatient glycemic control:  Novolog 0-20 units tid with meals and HS Semglee 64 units q HS Prednisone 10 mg daily   Inpatient Diabetes Program Recommendations:    Note low blood sugar this morning. Please consider reducing Semglee to 45 units q HS and add Novolog meal coverage 3 units tid with meals (hold if patient eats less than 50% or NPO).   Thanks,  Beryl Meager, RN, BC-ADM Inpatient Diabetes Coordinator Pager [phone]  (8a-5p)

## 2023-04-10 NOTE — ED Notes (Signed)
Dinner tray placed at bedside.

## 2023-04-10 NOTE — ED Notes (Signed)
Patient given lunch tray at this time.

## 2023-04-10 NOTE — ED Provider Notes (Signed)
Emergency Medicine Observation Re-evaluation Note  Cheyenne Frank is a 82 y.o. female, seen on rounds today.  Pt initially presented to the ED for complaints of Weakness  Currently, the patient is is no acute distress. Denies any concerns at this time.  Physical Exam  Blood pressure (!) 162/79, pulse 66, temperature 97.8 F (36.6 C), temperature source Axillary, resp. rate 18, SpO2 100%.  Physical Exam: General: No apparent distress Pulm: Normal WOB Neuro: Moving all extremities Psych: Resting comfortably     ED Course / MDM     I have reviewed the labs performed to date as well as medications administered while in observation.  Recent changes in the last 24 hours include: No acute events overnight.  Plan   Current plan: Patient awaiting social work disposition.  PT recommended home health for the patient.  Daughter wants the patient considered for SNF.  Family member does not feel that home PT will be safe discharge plan and wants reevaluation for SNF.  Confirm changes made by diabetic educator medication changes.    Corena Herter, MD 04/10/23 1530

## 2023-04-10 NOTE — ED Notes (Addendum)
Patient awake with visitor at bedside. Patient drank 8oz of orange juice. Dietary called at this time to send breakfast tray ASAP. Given cup of apple sauce.

## 2023-04-10 NOTE — NC FL2 (Signed)
[place] MEDICAID FL2 LEVEL OF CARE FORM     IDENTIFICATION  Patient Name: Cheyenne Frank Birthdate: 08/08/1941 Sex: female Admission Date (Current Location): 04/08/2023  [place] and [place] Number:  Chiropodist and Address:  [facility], [address], [place], [place] [address]      Provider Number: [id number]  Attending Physician Name and Address:  Chesley Noon, MD  Relative Name and Phone Number:  [name] [phone]    Current Level of Care: Hospital Recommended Level of Care: Skilled Nursing Facility Prior Approval Number:    Date Approved/Denied:   PASRR Number: [id number]  Discharge Plan: SNF    Current Diagnoses: Patient Active Problem List   Diagnosis Date Noted   Generalized weakness 04/09/2023   Physical deconditioning 04/09/2023   COPD exacerbation (HCC) 04/01/2023   Type II diabetes mellitus with renal manifestations (HCC) 04/01/2023   Acute bronchitis 02/23/2023   Acute on chronic diastolic (congestive) heart failure (HCC) 02/20/2023   Acute kidney injury superimposed on chronic kidney disease (HCC) 02/20/2023   Type 2 diabetes mellitus with peripheral neuropathy (HCC) 02/20/2023   HLD (hyperlipidemia) 02/20/2023   Hyperkalemia 02/20/2023   Atrial fibrillation with rapid ventricular response (HCC) 11/16/2022   Atrial fibrillation (HCC) 11/16/2022   Chest pain 09/09/2022   Elevated serum creatinine 09/09/2022   Polyuria 09/09/2022   Elevated troponin 09/09/2022   Stroke (HCC) 07/17/2022   CKD stage 3 due to type 2 diabetes mellitus (HCC) 07/17/2022   Postlaminectomy syndrome 02/05/2022   Dementia without behavioral disturbance (HCC) 02/02/2022   Acute metabolic encephalopathy 02/01/2022   Seizure disorder (HCC) 01/30/2022   Aphasia 01/26/2022   Hypertensive urgency 01/24/2022   Paroxysmal atrial fibrillation (HCC) 01/24/2022   History of CVA (cerebrovascular accident) 01/24/2022   Hoarseness of voice  10/27/2021   Acute on chronic diastolic CHF (congestive heart failure) (HCC) 10/26/2021   Acute hypoxemic respiratory failure (HCC) 10/26/2021   Atrial flutter with rapid ventricular response (HCC) 09/16/2021   Stage 3b chronic kidney disease (CKD) (HCC) 09/16/2021   TIA (transient ischemic attack) 09/16/2021   COPD (chronic obstructive pulmonary disease) (HCC) 09/16/2021   Depression 09/16/2021   Acute diastolic CHF (congestive heart failure) (HCC)    AKI (acute kidney injury) (HCC)    Obesity, Class III, BMI 40-49.9 (morbid obesity) (HCC)    Atrial fibrillation with RVR (HCC) 05/16/2021   Head injury 12/27/2020   SDH (subdural hematoma) (HCC) 12/11/2013   CVA (cerebral infarction) 12/11/2013   Uncontrolled type 2 diabetes mellitus with hyperglycemia, with long-term current use of insulin (HCC) 04/07/2013   HTN (hypertension) 04/07/2013   Sleep apnea 04/07/2013   OA (osteoarthritis) of knee 04/06/2013    Orientation RESPIRATION BLADDER Height & Weight     Self  O2 Incontinent Weight:   Height:     BEHAVIORAL SYMPTOMS/MOOD NEUROLOGICAL BOWEL NUTRITION STATUS      Incontinent Diet (see d/c summary)  AMBULATORY STATUS COMMUNICATION OF NEEDS Skin   Limited Assist Verbally Normal                       Personal Care Assistance Level of Assistance  Bathing, Dressing Bathing Assistance: Limited assistance   Dressing Assistance: Limited assistance     Functional Limitations Info             SPECIAL CARE FACTORS FREQUENCY  PT (By licensed PT), OT (By licensed OT)     PT Frequency: x5 min weekly OT Frequency: x5 min  weekly            Contractures Contractures Info: Not present    Additional Factors Info  Code Status, Allergies Code Status Info: Full Allergies Info: Diluadid and Codine           Current Medications (04/10/2023):  This is the current hospital active medication list Current Facility-Administered Medications  Medication Dose Route Frequency  Provider Last Rate Last Admin   albuterol (VENTOLIN HFA) 108 (90 Base) MCG/ACT inhaler 2 puff  2 puff Inhalation Q6H PRN Sharyn Creamer, MD       apixaban Everlene Balls) tablet 2.5 mg  2.5 mg Oral BID Sharyn Creamer, MD   2.5 mg at 04/10/23 0933   bisacodyl (DULCOLAX) EC tablet 10 mg  10 mg Oral Daily PRN Sharyn Creamer, MD       diltiazem (CARDIZEM CD) 24 hr capsule 120 mg  120 mg Oral Daily Sharyn Creamer, MD   120 mg at 04/10/23 0933   fluticasone furoate-vilanterol (BREO ELLIPTA) 100-25 MCG/ACT 1 puff  1 puff Inhalation Daily Sharyn Creamer, MD   1 puff at 04/10/23 0931   gabapentin (NEURONTIN) capsule 300 mg  300 mg Oral BID Sharyn Creamer, MD   300 mg at 04/10/23 0933   insulin aspart (novoLOG) injection 0-20 Units  0-20 Units Subcutaneous TID WC Sharyn Creamer, MD   11 Units at 04/09/23 1723   insulin aspart (novoLOG) injection 0-5 Units  0-5 Units Subcutaneous QHS Sharyn Creamer, MD       insulin glargine-yfgn (SEMGLEE) injection 45 Units  45 Units Subcutaneous QHS Chesley Noon, MD       iron polysaccharides (NIFEREX) capsule 150 mg  150 mg Oral Daily Sharyn Creamer, MD   150 mg at 04/10/23 1610   multivitamin with minerals tablet 1 tablet  1 tablet Oral Daily Sharyn Creamer, MD   1 tablet at 04/10/23 0933   oxybutynin (DITROPAN-XL) 24 hr tablet 10 mg  10 mg Oral QHS Sharyn Creamer, MD   10 mg at 04/09/23 2219   PARoxetine (PAXIL) tablet 40 mg  40 mg Oral QHS Sharyn Creamer, MD   40 mg at 04/09/23 2220   predniSONE (DELTASONE) tablet 10 mg  10 mg Oral Q breakfast Sharyn Creamer, MD   10 mg at 04/10/23 0933   sotalol (BETAPACE) tablet 80 mg  80 mg Oral Q12H Sharyn Creamer, MD   80 mg at 04/10/23 0933   torsemide (DEMADEX) tablet 20 mg  20 mg Oral Daily Sharyn Creamer, MD   20 mg at 04/10/23 9604   Current Outpatient Medications  Medication Sig Dispense Refill   acetaminophen (TYLENOL) 500 MG tablet Take 500-1,000 mg by mouth every 6 (six) hours as needed for mild pain or fever.      albuterol (VENTOLIN HFA) 108 (90 Base) MCG/ACT  inhaler Inhale 2 puffs into the lungs every 6 (six) hours as needed for wheezing or shortness of breath.     apixaban (ELIQUIS) 2.5 MG TABS tablet Take 1 tablet (2.5 mg total) by mouth 2 (two) times daily. 60 tablet 11   bisacodyl (DULCOLAX) 5 MG EC tablet Take 2 tablets (10 mg total) by mouth daily as needed for moderate constipation. 30 tablet 0   diltiazem (CARDIZEM CD) 120 MG 24 hr capsule Take 1 capsule (120 mg total) by mouth daily. 30 capsule 5   fluticasone furoate-vilanterol (BREO ELLIPTA) 100-25 MCG/ACT AEPB Inhale 1 puff into the lungs daily. 30 each 0   gabapentin (NEURONTIN) 300 MG capsule Take 1  capsule (300 mg total) by mouth 2 (two) times daily. 6 capsule 0   guaiFENesin (ROBITUSSIN) 100 MG/5ML liquid Take 5 mLs by mouth every 6 (six) hours as needed for cough or to loosen phlegm. 120 mL 0   insulin glargine (LANTUS) 100 UNIT/ML injection Inject 0.64 mLs (64 Units total) into the skin at bedtime.     insulin lispro (HUMALOG KWIKPEN) 100 UNIT/ML KwikPen Inject 10 Units into the skin 3 (three) times daily.     Multiple Vitamin (MULTIVITAMIN WITH MINERALS) TABS tablet Take 1 tablet by mouth daily.     Multiple Vitamins-Minerals (ICAPS AREDS 2 PO) Take 1 capsule by mouth in the morning and at bedtime.     omeprazole (PRILOSEC) 40 MG capsule Take 40 mg by mouth daily.      oxybutynin (DITROPAN-XL) 10 MG 24 hr tablet Take 10 mg by mouth at bedtime.     PARoxetine (PAXIL) 40 MG tablet Take 1 tablet (40 mg total) by mouth at bedtime. 6 tablet 0   rosuvastatin (CRESTOR) 5 MG tablet Take 2.5 mg by mouth at bedtime.     sitaGLIPtin (JANUVIA) 50 MG tablet Take 50 mg by mouth daily.     sotalol (BETAPACE) 80 MG tablet Take 1 tablet (80 mg total) by mouth every 12 (twelve) hours. 60 tablet 5   torsemide (DEMADEX) 20 MG tablet Take 1 tablet (20 mg total) by mouth daily. Please start from 11/01/2021 90 tablet 1   traZODone (DESYREL) 50 MG tablet Take 1.5 tablets (75 mg total) by mouth at bedtime.      iron polysaccharides (NIFEREX) 150 MG capsule Take 1 capsule (150 mg total) by mouth daily. 90 capsule 0   predniSONE (DELTASONE) 10 MG tablet Take 1 tablet (10 mg total) by mouth daily with breakfast for 3 days. 3 tablet 0     Discharge Medications: Please see discharge summary for a list of discharge medications.  Relevant Imaging Results:  Relevant Lab Results:   Additional Information SS #: [id number]  Kreg Shropshire, RN

## 2023-04-10 NOTE — ED Notes (Signed)
Patient had bm in bed. Pt cleansed, peri care performed, and new linens applied to bed. Pt also placed in clean brief and purewick changed. Pt positioned for comfort. Bed low and locked. Side rails raised x4. Bed alarm in use and call bell in reach. Pt denies further needs at this time.

## 2023-04-10 NOTE — ED Notes (Signed)
Patient resting with eyes closed. Respirations even and unlabored. NAD noted. 

## 2023-04-10 NOTE — TOC Progression Note (Addendum)
Transition of Care [facility]) - Progression Note    Patient Details  Name: Cheyenne Frank MRN: [id number] Date of Birth: [birth year]  [facility] CM/SW Contact  Kreg Shropshire, RN Phone Number: 04/10/2023, 2:07 PM  Clinical Narrative:     PT recommended HH for pt. Cm called pt daughter [name] and she stated she would like pt to be considered for SNF. Pt only receives HH PT x1 a week at home. Daughter stated that she almost dropped her twice at home. It took 4 people to get her in and out the car at home. Family feel like HH PT will not be a safe d/c and wanted a revaluation for SNF. Cm informed MD       Expected Discharge Plan and Services                                               Social Determinants of Health (SDOH) Interventions SDOH Screenings   Food Insecurity: Patient Declined (04/01/2023)  Housing: Patient Declined (04/01/2023)  Transportation Needs: Patient Declined (04/01/2023)  Utilities: Patient Declined (04/01/2023)  Financial Resource Strain: Low Risk  (07/08/2017)   Received from [facility], [facility]  Physical Activity: Inactive (07/08/2017)   Received from [facility], [facility]  Social Connections: Unknown (07/08/2017)   Received from [facility], [facility]  Tobacco Use: Low Risk  (04/08/2023)    Readmission Risk Interventions    02/22/2023   10:37 AM 07/18/2022    3:27 PM 01/28/2022    2:28 PM  Readmission Risk Prevention Plan  Transportation Screening Complete Complete Complete  PCP or Specialist Appt within 3-5 Days   Complete  HRI or Home Care Consult   Complete  Social Work Consult for Recovery Care Planning/Counseling   Complete  Palliative Care Screening   Complete  Medication Review Oceanographer) Complete Complete Complete  PCP or Specialist appointment within 3-5 days of discharge Complete Complete   HRI or Home  Care Consult  Complete   SW Recovery Care/Counseling Consult Complete Not Complete   SW Consult Not Complete Comments  NA   Palliative Care Screening Not Applicable Not Applicable   Skilled Nursing Facility Not Applicable Not Applicable

## 2023-04-10 NOTE — ED Notes (Signed)
Patient given another 8oz of orange juice. Patient now eating peanut butter crackers. Awaiting breakfast tray- dietary called again.

## 2023-04-11 LAB — CBG MONITORING, ED
Glucose-Capillary: 183 mg/dL — ABNORMAL HIGH (ref 70–99)
Glucose-Capillary: 290 mg/dL — ABNORMAL HIGH (ref 70–99)
Glucose-Capillary: 228 mg/dL — ABNORMAL HIGH (ref 70–99)

## 2023-04-11 MED ORDER — METOPROLOL TARTRATE 50 MG PO TABS: 50.0000 mg | ORAL_TABLET | Freq: Two times a day (BID) | ORAL | Status: DC

## 2023-04-11 NOTE — ED Notes (Signed)
Pt has friend/family at bedside and was given breakfast from an outside source by this person. Pt's breathing is regular and unlabored. Pt denies any needs at this time.

## 2023-04-11 NOTE — Progress Notes (Signed)
Physical Therapy Treatment Patient Details Name: ANITHA KREISER MRN: [id number] DOB: Jul 20, 1941 Today's Date: 04/11/2023   History of Present Illness 82 y/o female presented to ED on 04/08/23 for weakness and inadequate O2 at home after discharged from [facility] on same day. Recently admitted 7/15-7/22 for COPD exacerbation and Afib. PMH: HTN, DM, COPD, CHF, stroke, dementia, depression, CKD-4, Afib, SDH, RLS, fibromyalgia.    PT Comments  Patient is agreeable to PT session. Supportive caregiver and daughter at the bedside. The patient continues to require assistance with bed mobility, transfers, and short distance ambulation. Standing tolerance is limited with ambulation distance of only 57ft with rolling walker support. The patient has progressively required increased physical assistance and family report they are unable to provide the level of assistance required for safe discharge back home. Discharge recommendations have been updated. Anticipate the need for frequent assistance required after this hospital stay with ongoing PT recommended to maximize independence and decrease caregiver burden.     Assistance Recommended at Discharge Frequent or constant Supervision/Assistance  If plan is discharge home, recommend the following:  Can travel by private vehicle    Help with stairs or ramp for entrance;Assist for transportation;Assistance with cooking/housework;Direct supervision/assist for medications management;A lot of help with walking and/or transfers;A lot of help with bathing/dressing/bathroom   No  Equipment Recommendations  None recommended by PT    Recommendations for Other Services       Precautions / Restrictions Precautions Precautions: Fall Precaution Comments: watch O2 Restrictions Weight Bearing Restrictions: No     Mobility  Bed Mobility Overal bed mobility: Needs Assistance Bed Mobility: Supine to Sit     Supine to sit: HOB elevated, Max assist     General bed  mobility comments: assistance for trunk and BLE support. increased time and effort required    Transfers Overall transfer level: Needs assistance Equipment used: Rolling walker (2 wheels) Transfers: Sit to/from Stand, Bed to chair/wheelchair/BSC Sit to Stand: Min assist, From elevated surface   Step pivot transfers: Min assist       General transfer comment: verbal cues for technique and anterior weight shifting. 2 standing bouts performed, from bed and from recliner chair with seated rest breaks required. patient requires physical assistance with mobility    Ambulation/Gait Ambulation/Gait assistance: Min assist Gait Distance (Feet): 5 Feet Assistive device: Rolling walker (2 wheels) Gait Pattern/deviations: Decreased stride length, Trunk flexed Gait velocity: decreased     General Gait Details: standing activity tolerance is limited by fatigue and patient unable to ambulate further at this time. Sp02 91% on 3 L02. cues to stand closer to rolling walker for support with ambulation   Stairs             Wheelchair Mobility     Tilt Bed    Modified Rankin (Stroke Patients Only)       Balance Overall balance assessment: Needs assistance Sitting-balance support: Feet supported Sitting balance-Leahy Scale: Fair     Standing balance support: Bilateral upper extremity supported, Reliant on assistive device for balance Standing balance-Leahy Scale: Poor Standing balance comment: external support required                            Cognition Arousal/Alertness: Awake/alert Behavior During Therapy: WFL for tasks assessed/performed Overall Cognitive Status: History of cognitive impairments - at baseline  General Comments: patient is able to follow single step commands with increased time        Exercises      General Comments General comments (skin integrity, edema, etc.): patient requested to get up to  and stay in the recliner chair. family expressed concerns about patient not being mobilized enough while in the hospistal and inability to bring patient home safely at this time due to current level of assistance required with mobility. encouraged patient to sit up in the chair routinely for upright conditioning with staff assistance      Pertinent Vitals/Pain Pain Assessment Pain Assessment: No/denies pain    Home Living                          Prior Function            PT Goals (current goals can now be found in the care plan section) Acute Rehab PT Goals Patient Stated Goal: family wants patient to go to rehab PT Goal Formulation: With family Time For Goal Achievement: 04/23/23 Potential to Achieve Goals: Good Progress towards PT goals: Progressing toward goals    Frequency    Min 1X/week      PT Plan Discharge plan needs to be updated    Co-evaluation              AM-PAC PT "6 Clicks" Mobility   Outcome Measure  Help needed turning from your back to your side while in a flat bed without using bedrails?: A Little Help needed moving from lying on your back to sitting on the side of a flat bed without using bedrails?: A Lot Help needed moving to and from a bed to a chair (including a wheelchair)?: A Lot Help needed standing up from a chair using your arms (e.g., wheelchair or bedside chair)?: A Little Help needed to walk in hospital room?: A Little Help needed climbing 3-5 steps with a railing? : Total 6 Click Score: 14    End of Session Equipment Utilized During Treatment: Oxygen;Gait belt Activity Tolerance: Patient tolerated treatment well Patient left: in chair;with call bell/phone within reach;with family/visitor present (caregiver and daughter present in the room) Nurse Communication: Mobility status PT Visit Diagnosis: Unsteadiness on feet (R26.81);Other abnormalities of gait and mobility (R26.89);Muscle weakness (generalized)  (M62.81);Difficulty in walking, not elsewhere classified (R26.2)     Time: 7829-5621 PT Time Calculation (min) (ACUTE ONLY): 41 min  Charges:    $Therapeutic Activity: 38-52 mins PT General Charges $$ ACUTE PT VISIT: 1 Visit                    Donna Bernard, PT, MPT   Ina Homes 04/11/2023, 9:37 AM

## 2023-04-11 NOTE — ED Provider Notes (Signed)
Vitals:   04/10/23 0933 04/11/23 0932  BP: (!) 162/79 (!) 141/69  Pulse: 66 63  Resp: 18 16  Temp:  98.1 F (36.7 C)  SpO2: 100% 100%   Patient stable and appropriate for discharge.   Willy Eddy, MD 04/11/23 [phone]

## 2023-04-11 NOTE — ED Notes (Signed)
[facility]  CALLED FOR  TRANSPORT  TO  [facility] 

## 2023-04-11 NOTE — ED Notes (Signed)
Attempted to give report to [facility]

## 2023-04-11 NOTE — ED Notes (Signed)
Patient resting in bed free from sign of distress. Breathing unlabored  with symmetric chest rise and fall. Bed low and locked with side rails raised x2. Call bell in reach and monitor in place. Bed alarm in use.

## 2023-04-11 NOTE — TOC Transition Note (Signed)
Transition of Care [facility]) - CM/SW Discharge Note   Patient Details  Name: Cheyenne Frank MRN: [id number] Date of Birth: 1940/11/24  Transition of Care [facility]) CM/SW Contact:  Kreg Shropshire, RN Phone Number: 04/11/2023, 3:49 PM   Clinical Narrative:    Patient will DC to: [facility] Anticipated DC date: 04/11/23 Family notified: [name] Daughter  Transport by: Non emergent EMS  Per MD patient ready for DC to . RN, patient, patient's family, and facility notified of DC. Discharge Summary sent to facility. RN given number for report. DC packet on chart. Ambulance transport requested for patient.  TOC signing off.    Final next level of care: Skilled Nursing Facility Barriers to Discharge: Barriers Resolved   Patient Goals and CMS Choice CMS Medicare.gov Compare Post Acute Care list provided to:: Patient Represenative (must comment) (daughters) Choice offered to / list presented to : Adult Children  Discharge Placement                  Patient to be transferred to facility by: non emergent ems Name of family member notified: [name] Daughter Patient and family notified of of transfer: 04/11/23  Discharge Plan and Services Additional resources added to the After Visit Summary for                                       Social Determinants of Health (SDOH) Interventions SDOH Screenings   Food Insecurity: Patient Declined (04/01/2023)  Housing: Patient Declined (04/01/2023)  Transportation Needs: Patient Declined (04/01/2023)  Utilities: Patient Declined (04/01/2023)  Financial Resource Strain: Low Risk  (07/08/2017)   Received from [facility], [facility]  Physical Activity: Inactive (07/08/2017)   Received from [facility], [facility]  Social Connections: Unknown (07/08/2017)   Received from [facility], [facility]  Tobacco Use: Low Risk  (04/08/2023)      Readmission Risk Interventions    02/22/2023   10:37 AM 07/18/2022    3:27 PM 01/28/2022    2:28 PM  Readmission Risk Prevention Plan  Transportation Screening Complete Complete Complete  PCP or Specialist Appt within 3-5 Days   Complete  HRI or Home Care Consult   Complete  Social Work Consult for Recovery Care Planning/Counseling   Complete  Palliative Care Screening   Complete  Medication Review Oceanographer) Complete Complete Complete  PCP or Specialist appointment within 3-5 days of discharge Complete Complete   HRI or Home Care Consult  Complete   SW Recovery Care/Counseling Consult Complete Not Complete   SW Consult Not Complete Comments  NA   Palliative Care Screening Not Applicable Not Applicable   Skilled Nursing Facility Not Applicable Not Applicable

## 2023-04-11 NOTE — ED Notes (Signed)
Attempted to give report to Kerin Salen for 2nd time.

## 2023-04-11 NOTE — ED Notes (Signed)
Patient called out requesting breakfast tray. Pt informed breakfast trays to be delivered between 7-8 am. RN offered pt a snack in the meantime and pt declined. RN attempted to re-orient pt to location and treatment plan with minimal success. Pt denies further needs and is currently resting in bed free from sign of distress. Breathing unlabored  with symmetric chest rise and fall. Bed low and locked with side rails raised x2. Call bell in reach and monitor in place. Bed alarm in use.

## 2023-04-11 NOTE — ED Provider Notes (Signed)
-----------------------------------------   5:51 AM on 04/11/2023 -----------------------------------------   Blood pressure (!) 162/79, pulse 66, temperature 97.8 F (36.6 C), temperature source Axillary, resp. rate 18, SpO2 100%.  The patient is calm and cooperative at this time.  There have been no acute events since the last update.  Awaiting disposition plan from Social Work team.   Irean Hong, MD 04/11/23 [phone]

## 2023-04-11 NOTE — TOC Progression Note (Addendum)
Transition of Care [facility]) - Progression Note    Patient Details  Name: Cheyenne Frank MRN: [id number] Date of Birth: 28-Jul-1941  Transition of Care [facility]) CM/SW Contact  Kreg Shropshire, RN Phone Number: 04/11/2023, 9:51 AM  Clinical Narrative:    Updated PT notes for SNF recommendations. Cm spoke with pt daughter about preference for SNF. She stated [facility] and [facility]. Cm resent updated therapy notes for SNF rec. Cm will call daughter to update.  1254-Family really wants [facility] or [facility]. Cm sent message to both facilities to take a look at referral.  1439- [facility] is able to accept pt for short term rehab. Cm talked with daughter Aram Beecham and she is agreeable to the plan.        Expected Discharge Plan and Services                                               Social Determinants of Health (SDOH) Interventions SDOH Screenings   Food Insecurity: Patient Declined (04/01/2023)  Housing: Patient Declined (04/01/2023)  Transportation Needs: Patient Declined (04/01/2023)  Utilities: Patient Declined (04/01/2023)  Financial Resource Strain: Low Risk  (07/08/2017)   Received from [facility], [facility]  Physical Activity: Inactive (07/08/2017)   Received from [facility], [facility]  Social Connections: Unknown (07/08/2017)   Received from [facility], [facility]  Tobacco Use: Low Risk  (04/08/2023)    Readmission Risk Interventions    02/22/2023   10:37 AM 07/18/2022    3:27 PM 01/28/2022    2:28 PM  Readmission Risk Prevention Plan  Transportation Screening Complete Complete Complete  PCP or Specialist Appt within 3-5 Days   Complete  HRI or Home Care Consult   Complete  Social Work Consult for Recovery Care Planning/Counseling   Complete  Palliative Care Screening   Complete  Medication Review Furniture conservator/restorer) Complete Complete Complete  PCP or Specialist appointment within 3-5 days of discharge Complete Complete   HRI or Home Care Consult  Complete   SW Recovery Care/Counseling Consult Complete Not Complete   SW Consult Not Complete Comments  NA   Palliative Care Screening Not Applicable Not Applicable   Skilled Nursing Facility Not Applicable Not Applicable

## 2023-05-23 ENCOUNTER — Other Ambulatory Visit
Admission: RE | Admit: 2023-05-23 | Discharge: 2023-05-23 | Disposition: A | Discharge status: Home / Self Care | Payer: Medicare Other | Source: Ambulatory Visit | Attending: Nurse Practitioner | Admitting: Nurse Practitioner

## 2023-05-23 DIAGNOSIS — I5032 Chronic diastolic (congestive) heart failure: Secondary | ICD-10-CM | POA: Diagnosis present

## 2023-05-23 LAB — BRAIN NATRIURETIC PEPTIDE: B Natriuretic Peptide: 174.4 pg/mL — ABNORMAL HIGH (ref 0.0–100.0)

## 2023-05-27 NOTE — Group Note (Deleted)

## 2023-07-18 ENCOUNTER — Emergency Department: Payer: Medicare Other

## 2023-07-18 ENCOUNTER — Emergency Department
Admission: EM | Admit: 2023-07-18 | Discharge: 2023-07-19 | Disposition: A | Discharge status: Home / Self Care | Payer: Medicare Other | Attending: Emergency Medicine | Admitting: Emergency Medicine

## 2023-07-18 ENCOUNTER — Other Ambulatory Visit: Payer: Self-pay

## 2023-07-18 DIAGNOSIS — Z8673 Personal history of transient ischemic attack (TIA), and cerebral infarction without residual deficits: Secondary | ICD-10-CM | POA: Insufficient documentation

## 2023-07-18 DIAGNOSIS — Z7901 Long term (current) use of anticoagulants: Secondary | ICD-10-CM | POA: Diagnosis not present

## 2023-07-18 DIAGNOSIS — I129 Hypertensive chronic kidney disease with stage 1 through stage 4 chronic kidney disease, or unspecified chronic kidney disease: Secondary | ICD-10-CM | POA: Diagnosis not present

## 2023-07-18 DIAGNOSIS — N189 Chronic kidney disease, unspecified: Secondary | ICD-10-CM | POA: Diagnosis not present

## 2023-07-18 DIAGNOSIS — F0390 Unspecified dementia without behavioral disturbance: Secondary | ICD-10-CM | POA: Diagnosis not present

## 2023-07-18 DIAGNOSIS — M545 Low back pain, unspecified: Secondary | ICD-10-CM

## 2023-07-18 DIAGNOSIS — G8911 Acute pain due to trauma: Secondary | ICD-10-CM | POA: Insufficient documentation

## 2023-07-18 DIAGNOSIS — E1122 Type 2 diabetes mellitus with diabetic chronic kidney disease: Secondary | ICD-10-CM | POA: Diagnosis not present

## 2023-07-18 DIAGNOSIS — J449 Chronic obstructive pulmonary disease, unspecified: Secondary | ICD-10-CM | POA: Diagnosis not present

## 2023-07-18 DIAGNOSIS — W19XXXA Unspecified fall, initial encounter: Secondary | ICD-10-CM

## 2023-07-18 DIAGNOSIS — S0990XA Unspecified injury of head, initial encounter: Secondary | ICD-10-CM | POA: Diagnosis present

## 2023-07-18 NOTE — ED Notes (Signed)
Pt disconnected from pulse oximeter and blood pressure cuff to be transported to CT.

## 2023-07-18 NOTE — ED Provider Notes (Signed)
[facility] Provider Note    Event Date/Time   First MD Initiated Contact with Patient 07/18/23 2259     (approximate)   History   Fall   HPI  Cheyenne Frank is a 82 year old female with history of A-fib on Eliquis, HTN, DM, COPD, CVA, dementia, CKD presenting to the emergency department for evaluation after a fall. Patient stood up from her wheelchair to put some clothes away when she lost her balance and fell back onto her butt.  She denies hitting her head, but per EMS report staff said that she did.  No LOC.  Is anticoagulated on Eliquis.  Patient says she has some pain in her lower back currently, denies pain in other areas.     Physical Exam   Triage Vital Signs: ED Triage Vitals [07/18/23 2047]  Encounter Vitals Group     BP (!) 182/74     Systolic BP Percentile      Diastolic BP Percentile      Pulse Rate 69     Resp 18     Temp 99 F (37.2 C)     Temp Source Oral     SpO2 100 %     Weight      Height      Head Circumference      Peak Flow      Pain Score      Pain Loc      Pain Education      Exclude from Growth Chart     Most recent vital signs: Vitals:   07/19/23 0030 07/19/23 0230  BP: (!) 193/60 (!) 153/73  Pulse: 69 72  Resp: 16 16  Temp:  98 F (36.7 C)  SpO2: 97% 91%    Nursing notes and vital signs reviewed.  General: Female, laying in bed, awake and interactive Head: Atraumatic Chest: Symmetric chest rise, no tenderness to palpation.  Cardiac: Regular rhythm and rate.  Respiratory: Lungs clear to auscultation Abdomen: Soft, nondistended. No tenderness to palpation.  Pelvis: Stable in AP and lateral compression.  MSK: No deformity to bilateral upper and lower extremity. Full range of motion to bilateral upper lower extremity  Neuro: Alert, oriented. GCS 15.  Normal sensation to light touch in bilateral upper and lower extremity. Back: Tenderness to the lumbar spine into the sacral area and posterior pelvic  area Skin: No evidence of burns or lacerations.   ED Results / Procedures / Treatments   Labs (all labs ordered are listed, but only abnormal results are displayed) Labs Reviewed - No data to display   EKG EKG independently reviewed interpreted by myself (ER attending) demonstrates:    RADIOLOGY Imaging independently reviewed and interpreted by myself demonstrates:  CT head without acute bleed CT C-spine without acute fracture Lumbar x-Cheyenne Frank without acute fracture Hip x-Cheyenne Frank without obvious fracture, radiology notes possible rami fracture CT pelvis without evidence of acute fracture   PROCEDURES:  Critical Care performed: No  Procedures   MEDICATIONS ORDERED IN ED: Medications - No data to display   IMPRESSION / MDM / ASSESSMENT AND PLAN / ED COURSE  I reviewed the triage vital signs and the nursing notes.  Differential diagnosis includes, but is not limited to, intracranial leak, skull fracture, spine fracture, lumbar fracture, pelvic or hip fracture  Patient's presentation is most consistent with acute presentation with potential threat to life or bodily function.  82 year old female presenting to the Emergency Department after a fall.  Vital signs stable  on presentation.  Satting in the mid 90s on her home 2 L at time of my initial evaluation.  CT head and C-spine without acute traumatic injuries.  Full effusion partly visualized on cervical spine CT, patient without respiratory complaints, hypoxia, has had effusions on prior x-rays.  Does report that she landed on her butt with some pain in her lumbar and pelvic region.  Will obtain x-rays to further evaluate.    Cheyenne Frank of the hip with questionable pelvic fracture, but CT obtained fortunately without evidence of pelvic fracture.  Patient updated on the results of her workup.  She is comfortable with discharge home.  Strict return precautions provided.  Patient discharged in stable condition.    FINAL CLINICAL  IMPRESSION(S) / ED DIAGNOSES   Final diagnoses:  Acute low back pain without sciatica, unspecified back pain laterality  Injury of head, initial encounter  Fall, initial encounter     Rx / DC Orders   ED Discharge Orders     None        Note:  This document was prepared using Dragon voice recognition software and may include unintentional dictation errors.   Trinna Post, MD 07/19/23 [phone]

## 2023-07-18 NOTE — ED Notes (Signed)
RN for pt stated that pt did not need to be on cardiac monitor anymore.

## 2023-07-18 NOTE — ED Triage Notes (Signed)
Pt to ED via [facility] from [facility] c/o fall. Pt reports putting supplies away and falling on butt. Pt denies hitting head but staff says she did. No LOC, pt takes eliquis. Pt has hx of dementia baseline unknown. Pt A&Ox3 at this time, oriented to self, situation, and place.

## 2023-07-19 ENCOUNTER — Emergency Department: Payer: Medicare Other

## 2023-07-19 DIAGNOSIS — S0990XA Unspecified injury of head, initial encounter: Secondary | ICD-10-CM | POA: Diagnosis not present

## 2023-07-19 NOTE — Discharge Instructions (Addendum)
You were seen in the emergency room today for evaluation after your fall.  Fortunately we did not find any serious injuries.  Please make sure you are using your mobility devices as directed.  Return to the ER for new or worsening symptoms.

## 2023-07-19 NOTE — ED Notes (Signed)
First attempt made to call report to [facility]. No answer.

## 2023-07-19 NOTE — ED Notes (Signed)
3rd attempt made to call [facility].  This time from personal phone.  No answer.

## 2023-07-19 NOTE — ED Notes (Signed)
called to [facility] per RN Richard for pt transport to facility/[facility]/rep:richard.

## 2023-07-19 NOTE — ED Notes (Signed)
Second attempt made to call report to [facility].  No answer.

## 2023-10-19 DEATH — deceased
